# Patient Record
Sex: Female | Born: 1965
Health system: Southern US, Community
[De-identification: ages and names within clinical notes are randomized; demographics above are authoritative.]

## PROBLEM LIST (undated history)

## (undated) DIAGNOSIS — I219 Acute myocardial infarction, unspecified: Secondary | ICD-10-CM

## (undated) DIAGNOSIS — I2510 Atherosclerotic heart disease of native coronary artery without angina pectoris: Secondary | ICD-10-CM

## (undated) DIAGNOSIS — F419 Anxiety disorder, unspecified: Secondary | ICD-10-CM

## (undated) DIAGNOSIS — D496 Neoplasm of unspecified behavior of brain: Secondary | ICD-10-CM

## (undated) DIAGNOSIS — R519 Headache, unspecified: Secondary | ICD-10-CM

## (undated) DIAGNOSIS — T1491XA Suicide attempt, initial encounter: Secondary | ICD-10-CM

## (undated) DIAGNOSIS — D649 Anemia, unspecified: Secondary | ICD-10-CM

## (undated) DIAGNOSIS — Z923 Personal history of irradiation: Secondary | ICD-10-CM

## (undated) DIAGNOSIS — C50919 Malignant neoplasm of unspecified site of unspecified female breast: Secondary | ICD-10-CM

## (undated) DIAGNOSIS — B9689 Other specified bacterial agents as the cause of diseases classified elsewhere: Secondary | ICD-10-CM

## (undated) DIAGNOSIS — E119 Type 2 diabetes mellitus without complications: Secondary | ICD-10-CM

## (undated) DIAGNOSIS — F329 Major depressive disorder, single episode, unspecified: Secondary | ICD-10-CM

## (undated) DIAGNOSIS — F32A Depression, unspecified: Secondary | ICD-10-CM

## (undated) DIAGNOSIS — R6882 Decreased libido: Secondary | ICD-10-CM

## (undated) DIAGNOSIS — Z87448 Personal history of other diseases of urinary system: Secondary | ICD-10-CM

## (undated) DIAGNOSIS — N6002 Solitary cyst of left breast: Secondary | ICD-10-CM

## (undated) DIAGNOSIS — N760 Acute vaginitis: Secondary | ICD-10-CM

## (undated) DIAGNOSIS — N6001 Solitary cyst of right breast: Secondary | ICD-10-CM

## (undated) DIAGNOSIS — I10 Essential (primary) hypertension: Secondary | ICD-10-CM

## (undated) DIAGNOSIS — Z853 Personal history of malignant neoplasm of breast: Secondary | ICD-10-CM

## (undated) DIAGNOSIS — K279 Peptic ulcer, site unspecified, unspecified as acute or chronic, without hemorrhage or perforation: Secondary | ICD-10-CM

## (undated) DIAGNOSIS — W19XXXA Unspecified fall, initial encounter: Secondary | ICD-10-CM

## (undated) DIAGNOSIS — K219 Gastro-esophageal reflux disease without esophagitis: Secondary | ICD-10-CM

## (undated) DIAGNOSIS — B029 Zoster without complications: Secondary | ICD-10-CM

## (undated) DIAGNOSIS — E782 Mixed hyperlipidemia: Secondary | ICD-10-CM

## (undated) DIAGNOSIS — R569 Unspecified convulsions: Secondary | ICD-10-CM

## (undated) DIAGNOSIS — I639 Cerebral infarction, unspecified: Secondary | ICD-10-CM

## (undated) DIAGNOSIS — R296 Repeated falls: Secondary | ICD-10-CM

## (undated) DIAGNOSIS — R7303 Prediabetes: Secondary | ICD-10-CM

## (undated) DIAGNOSIS — Z9221 Personal history of antineoplastic chemotherapy: Secondary | ICD-10-CM

## (undated) DIAGNOSIS — C55 Malignant neoplasm of uterus, part unspecified: Secondary | ICD-10-CM

## (undated) DIAGNOSIS — J392 Other diseases of pharynx: Secondary | ICD-10-CM

## (undated) DIAGNOSIS — N649 Disorder of breast, unspecified: Secondary | ICD-10-CM

## (undated) HISTORY — DX: Personal history of other diseases of urinary system: Z87.448

## (undated) HISTORY — DX: Suicide attempt, initial encounter: T14.91XA

## (undated) HISTORY — DX: Gastro-esophageal reflux disease without esophagitis: K21.9

## (undated) HISTORY — DX: Other diseases of pharynx: J39.2

## (undated) HISTORY — PX: ABDOMINAL HYSTERECTOMY: SHX81

## (undated) HISTORY — PX: HAND SURGERY: SHX662

## (undated) HISTORY — DX: Neoplasm of unspecified behavior of brain: D49.6

## (undated) HISTORY — DX: Peptic ulcer, site unspecified, unspecified as acute or chronic, without hemorrhage or perforation: K27.9

## (undated) HISTORY — PX: OTHER SURGICAL HISTORY: SHX169

## (undated) HISTORY — PX: SHOULDER SURGERY: SHX246

## (undated) HISTORY — DX: Other specified bacterial agents as the cause of diseases classified elsewhere: B96.89

## (undated) HISTORY — DX: Malignant neoplasm of uterus, part unspecified: C55

## (undated) HISTORY — PX: EYE SURGERY: SHX253

## (undated) HISTORY — DX: Repeated falls: R29.6

## (undated) HISTORY — DX: Solitary cyst of right breast: N60.01

## (undated) HISTORY — DX: Disorder of breast, unspecified: N64.9

## (undated) HISTORY — DX: Headache, unspecified: R51.9

## (undated) HISTORY — DX: Mixed hyperlipidemia: E78.2

## (undated) HISTORY — DX: Acute vaginitis: N76.0

## (undated) HISTORY — DX: Anemia, unspecified: D64.9

## (undated) HISTORY — DX: Solitary cyst of left breast: N60.02

## (undated) HISTORY — DX: Essential (primary) hypertension: I10

## (undated) HISTORY — PX: ESOPHAGOGASTRODUODENOSCOPY: SHX1529

## (undated) HISTORY — PX: BREAST LUMPECTOMY: SHX2

## (undated) HISTORY — DX: Atherosclerotic heart disease of native coronary artery without angina pectoris: I25.10

## (undated) HISTORY — DX: Unspecified fall, initial encounter: W19.XXXA

## (undated) HISTORY — DX: Personal history of malignant neoplasm of breast: Z85.3

## (undated) HISTORY — PX: BLADDER SURGERY: SHX569

## (undated) HISTORY — DX: Zoster without complications: B02.9

## (undated) HISTORY — DX: Unspecified convulsions: R56.9

---
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
## 1997-12-02 ENCOUNTER — Other Ambulatory Visit: Admission: RE | Admit: 1997-12-02 | Discharge: 1997-12-02 | Payer: Self-pay

## 1998-12-11 ENCOUNTER — Ambulatory Visit (HOSPITAL_COMMUNITY): Admission: RE | Admit: 1998-12-11 | Discharge: 1998-12-11 | Payer: Self-pay | Admitting: Gastroenterology

## 1999-11-19 ENCOUNTER — Encounter: Payer: Self-pay | Admitting: Neurosurgery

## 1999-11-19 ENCOUNTER — Ambulatory Visit (HOSPITAL_COMMUNITY): Admission: RE | Admit: 1999-11-19 | Discharge: 1999-11-19 | Payer: Self-pay | Admitting: Neurosurgery

## 1999-12-23 ENCOUNTER — Ambulatory Visit (HOSPITAL_COMMUNITY): Admission: RE | Admit: 1999-12-23 | Discharge: 1999-12-23 | Payer: Self-pay | Admitting: Gastroenterology

## 1999-12-23 ENCOUNTER — Encounter: Payer: Self-pay | Admitting: Gastroenterology

## 2000-09-20 ENCOUNTER — Emergency Department (HOSPITAL_COMMUNITY): Admission: EM | Admit: 2000-09-20 | Discharge: 2000-09-21 | Payer: Self-pay | Admitting: Emergency Medicine

## 2000-09-21 ENCOUNTER — Ambulatory Visit (HOSPITAL_COMMUNITY): Admission: AD | Admit: 2000-09-21 | Discharge: 2000-09-21 | Payer: Self-pay | Admitting: *Deleted

## 2000-09-21 ENCOUNTER — Encounter (INDEPENDENT_AMBULATORY_CARE_PROVIDER_SITE_OTHER): Payer: Self-pay | Admitting: Specialist

## 2000-12-06 ENCOUNTER — Ambulatory Visit (HOSPITAL_COMMUNITY): Admission: RE | Admit: 2000-12-06 | Discharge: 2000-12-06 | Payer: Self-pay | Admitting: General Surgery

## 2000-12-10 ENCOUNTER — Inpatient Hospital Stay (HOSPITAL_COMMUNITY): Admission: EM | Admit: 2000-12-10 | Discharge: 2000-12-11 | Payer: Self-pay | Admitting: Emergency Medicine

## 2000-12-29 ENCOUNTER — Other Ambulatory Visit: Admission: RE | Admit: 2000-12-29 | Discharge: 2000-12-29 | Payer: Self-pay | Admitting: Internal Medicine

## 2001-01-03 ENCOUNTER — Ambulatory Visit (HOSPITAL_COMMUNITY): Admission: RE | Admit: 2001-01-03 | Discharge: 2001-01-03 | Payer: Self-pay | Admitting: Internal Medicine

## 2001-01-03 ENCOUNTER — Encounter: Payer: Self-pay | Admitting: Internal Medicine

## 2001-01-04 ENCOUNTER — Inpatient Hospital Stay (HOSPITAL_COMMUNITY): Admission: EM | Admit: 2001-01-04 | Discharge: 2001-01-08 | Payer: Self-pay | Admitting: Psychiatry

## 2001-01-19 ENCOUNTER — Emergency Department (HOSPITAL_COMMUNITY): Admission: EM | Admit: 2001-01-19 | Discharge: 2001-01-19 | Payer: Self-pay | Admitting: Internal Medicine

## 2001-02-23 ENCOUNTER — Emergency Department (HOSPITAL_COMMUNITY): Admission: EM | Admit: 2001-02-23 | Discharge: 2001-02-23 | Payer: Self-pay | Admitting: Internal Medicine

## 2001-04-25 ENCOUNTER — Inpatient Hospital Stay (HOSPITAL_COMMUNITY): Admission: RE | Admit: 2001-04-25 | Discharge: 2001-05-01 | Payer: Self-pay | Admitting: General Surgery

## 2001-04-27 ENCOUNTER — Encounter: Payer: Self-pay | Admitting: General Surgery

## 2001-04-28 ENCOUNTER — Encounter: Payer: Self-pay | Admitting: General Surgery

## 2001-05-03 ENCOUNTER — Emergency Department (HOSPITAL_COMMUNITY): Admission: EM | Admit: 2001-05-03 | Discharge: 2001-05-04 | Payer: Self-pay | Admitting: *Deleted

## 2001-05-15 ENCOUNTER — Emergency Department (HOSPITAL_COMMUNITY): Admission: EM | Admit: 2001-05-15 | Discharge: 2001-05-15 | Payer: Self-pay | Admitting: Emergency Medicine

## 2001-05-15 ENCOUNTER — Encounter: Payer: Self-pay | Admitting: Emergency Medicine

## 2001-06-06 ENCOUNTER — Ambulatory Visit (HOSPITAL_COMMUNITY): Admission: RE | Admit: 2001-06-06 | Discharge: 2001-06-06 | Payer: Self-pay | Admitting: Internal Medicine

## 2001-06-06 ENCOUNTER — Encounter: Payer: Self-pay | Admitting: Internal Medicine

## 2001-06-12 ENCOUNTER — Ambulatory Visit (HOSPITAL_COMMUNITY): Admission: RE | Admit: 2001-06-12 | Discharge: 2001-06-12 | Payer: Self-pay | Admitting: Internal Medicine

## 2001-06-12 ENCOUNTER — Encounter: Payer: Self-pay | Admitting: Internal Medicine

## 2001-06-16 ENCOUNTER — Encounter: Payer: Self-pay | Admitting: Internal Medicine

## 2001-06-16 ENCOUNTER — Ambulatory Visit (HOSPITAL_COMMUNITY): Admission: RE | Admit: 2001-06-16 | Discharge: 2001-06-16 | Payer: Self-pay | Admitting: Internal Medicine

## 2001-09-04 ENCOUNTER — Emergency Department (HOSPITAL_COMMUNITY): Admission: EM | Admit: 2001-09-04 | Discharge: 2001-09-04 | Payer: Self-pay | Admitting: Emergency Medicine

## 2001-10-29 ENCOUNTER — Emergency Department (HOSPITAL_COMMUNITY): Admission: EM | Admit: 2001-10-29 | Discharge: 2001-10-29 | Payer: Self-pay | Admitting: Emergency Medicine

## 2001-11-11 ENCOUNTER — Emergency Department (HOSPITAL_COMMUNITY): Admission: EM | Admit: 2001-11-11 | Discharge: 2001-11-11 | Payer: Self-pay | Admitting: Emergency Medicine

## 2001-11-23 ENCOUNTER — Ambulatory Visit (HOSPITAL_COMMUNITY): Admission: RE | Admit: 2001-11-23 | Discharge: 2001-11-23 | Payer: Self-pay | Admitting: Internal Medicine

## 2001-11-29 ENCOUNTER — Ambulatory Visit (HOSPITAL_COMMUNITY): Admission: RE | Admit: 2001-11-29 | Discharge: 2001-11-29 | Payer: Self-pay | Admitting: Internal Medicine

## 2002-01-22 ENCOUNTER — Encounter: Payer: Self-pay | Admitting: Internal Medicine

## 2002-01-22 ENCOUNTER — Ambulatory Visit (HOSPITAL_COMMUNITY): Admission: RE | Admit: 2002-01-22 | Discharge: 2002-01-22 | Payer: Self-pay | Admitting: Internal Medicine

## 2002-02-03 ENCOUNTER — Emergency Department (HOSPITAL_COMMUNITY): Admission: EM | Admit: 2002-02-03 | Discharge: 2002-02-03 | Payer: Self-pay | Admitting: Emergency Medicine

## 2002-03-29 ENCOUNTER — Emergency Department (HOSPITAL_COMMUNITY): Admission: EM | Admit: 2002-03-29 | Discharge: 2002-03-30 | Payer: Self-pay | Admitting: Emergency Medicine

## 2002-06-13 ENCOUNTER — Ambulatory Visit (HOSPITAL_COMMUNITY): Admission: RE | Admit: 2002-06-13 | Discharge: 2002-06-13 | Payer: Self-pay | Admitting: General Surgery

## 2002-07-24 ENCOUNTER — Emergency Department (HOSPITAL_COMMUNITY): Admission: EM | Admit: 2002-07-24 | Discharge: 2002-07-24 | Payer: Self-pay | Admitting: Emergency Medicine

## 2002-12-10 ENCOUNTER — Emergency Department (HOSPITAL_COMMUNITY): Admission: EM | Admit: 2002-12-10 | Discharge: 2002-12-10 | Payer: Self-pay | Admitting: Emergency Medicine

## 2003-02-02 ENCOUNTER — Emergency Department (HOSPITAL_COMMUNITY): Admission: EM | Admit: 2003-02-02 | Discharge: 2003-02-03 | Payer: Self-pay | Admitting: Internal Medicine

## 2003-02-02 ENCOUNTER — Encounter: Payer: Self-pay | Admitting: Internal Medicine

## 2003-03-01 ENCOUNTER — Emergency Department (HOSPITAL_COMMUNITY): Admission: EM | Admit: 2003-03-01 | Discharge: 2003-03-02 | Payer: Self-pay | Admitting: Emergency Medicine

## 2003-05-29 ENCOUNTER — Ambulatory Visit (HOSPITAL_COMMUNITY): Admission: RE | Admit: 2003-05-29 | Discharge: 2003-05-29 | Payer: Self-pay | Admitting: General Surgery

## 2003-07-03 ENCOUNTER — Ambulatory Visit (HOSPITAL_COMMUNITY): Admission: RE | Admit: 2003-07-03 | Discharge: 2003-07-03 | Payer: Self-pay | Admitting: General Surgery

## 2003-08-08 ENCOUNTER — Emergency Department (HOSPITAL_COMMUNITY): Admission: EM | Admit: 2003-08-08 | Discharge: 2003-08-09 | Payer: Self-pay | Admitting: *Deleted

## 2003-08-27 ENCOUNTER — Emergency Department (HOSPITAL_COMMUNITY): Admission: EM | Admit: 2003-08-27 | Discharge: 2003-08-27 | Payer: Self-pay | Admitting: Emergency Medicine

## 2003-12-09 ENCOUNTER — Ambulatory Visit (HOSPITAL_COMMUNITY): Admission: RE | Admit: 2003-12-09 | Discharge: 2003-12-09 | Payer: Self-pay | Admitting: General Surgery

## 2004-01-25 ENCOUNTER — Emergency Department (HOSPITAL_COMMUNITY): Admission: EM | Admit: 2004-01-25 | Discharge: 2004-01-26 | Payer: Self-pay | Admitting: Emergency Medicine

## 2004-04-22 ENCOUNTER — Emergency Department (HOSPITAL_COMMUNITY): Admission: EM | Admit: 2004-04-22 | Discharge: 2004-04-22 | Payer: Self-pay | Admitting: Emergency Medicine

## 2004-04-23 ENCOUNTER — Ambulatory Visit (HOSPITAL_COMMUNITY): Admission: RE | Admit: 2004-04-23 | Discharge: 2004-04-23 | Payer: Self-pay | Admitting: Specialist

## 2004-06-05 ENCOUNTER — Emergency Department (HOSPITAL_COMMUNITY): Admission: EM | Admit: 2004-06-05 | Discharge: 2004-06-05 | Payer: Self-pay | Admitting: Emergency Medicine

## 2004-08-26 ENCOUNTER — Encounter: Admission: RE | Admit: 2004-08-26 | Discharge: 2004-08-26 | Payer: Self-pay | Admitting: General Surgery

## 2005-01-06 ENCOUNTER — Emergency Department (HOSPITAL_COMMUNITY): Admission: EM | Admit: 2005-01-06 | Discharge: 2005-01-06 | Payer: Self-pay | Admitting: Emergency Medicine

## 2005-02-18 ENCOUNTER — Emergency Department (HOSPITAL_COMMUNITY): Admission: EM | Admit: 2005-02-18 | Discharge: 2005-02-18 | Payer: Self-pay | Admitting: Emergency Medicine

## 2005-02-19 ENCOUNTER — Emergency Department (HOSPITAL_COMMUNITY): Admission: EM | Admit: 2005-02-19 | Discharge: 2005-02-20 | Payer: Self-pay | Admitting: Emergency Medicine

## 2005-03-02 ENCOUNTER — Encounter (HOSPITAL_COMMUNITY): Admission: RE | Admit: 2005-03-02 | Discharge: 2005-04-01 | Payer: Self-pay | Admitting: General Surgery

## 2005-03-02 ENCOUNTER — Ambulatory Visit: Payer: Self-pay | Admitting: *Deleted

## 2005-03-08 ENCOUNTER — Encounter (HOSPITAL_COMMUNITY): Admission: RE | Admit: 2005-03-08 | Discharge: 2005-04-07 | Payer: Self-pay | Admitting: Internal Medicine

## 2005-05-03 ENCOUNTER — Ambulatory Visit: Payer: Self-pay | Admitting: Orthopedic Surgery

## 2005-05-05 ENCOUNTER — Ambulatory Visit (HOSPITAL_COMMUNITY): Admission: RE | Admit: 2005-05-05 | Discharge: 2005-05-05 | Payer: Self-pay | Admitting: Orthopedic Surgery

## 2005-08-12 ENCOUNTER — Ambulatory Visit (HOSPITAL_COMMUNITY): Admission: RE | Admit: 2005-08-12 | Discharge: 2005-08-12 | Payer: Self-pay | Admitting: Neurology

## 2006-04-27 ENCOUNTER — Ambulatory Visit (HOSPITAL_COMMUNITY): Admission: RE | Admit: 2006-04-27 | Discharge: 2006-04-27 | Payer: Self-pay | Admitting: Internal Medicine

## 2006-05-05 ENCOUNTER — Emergency Department (HOSPITAL_COMMUNITY): Admission: EM | Admit: 2006-05-05 | Discharge: 2006-05-05 | Payer: Self-pay | Admitting: Emergency Medicine

## 2006-05-18 ENCOUNTER — Ambulatory Visit (HOSPITAL_COMMUNITY): Admission: RE | Admit: 2006-05-18 | Discharge: 2006-05-18 | Payer: Self-pay | Admitting: Internal Medicine

## 2006-06-12 ENCOUNTER — Emergency Department (HOSPITAL_COMMUNITY): Admission: EM | Admit: 2006-06-12 | Discharge: 2006-06-13 | Payer: Self-pay | Admitting: Emergency Medicine

## 2006-06-15 ENCOUNTER — Ambulatory Visit: Payer: Self-pay | Admitting: Orthopedic Surgery

## 2006-10-18 ENCOUNTER — Emergency Department (HOSPITAL_COMMUNITY): Admission: EM | Admit: 2006-10-18 | Discharge: 2006-10-18 | Payer: Self-pay | Admitting: Emergency Medicine

## 2006-11-21 ENCOUNTER — Emergency Department (HOSPITAL_COMMUNITY): Admission: EM | Admit: 2006-11-21 | Discharge: 2006-11-22 | Payer: Self-pay | Admitting: Emergency Medicine

## 2006-11-29 ENCOUNTER — Emergency Department (HOSPITAL_COMMUNITY): Admission: EM | Admit: 2006-11-29 | Discharge: 2006-11-29 | Payer: Self-pay | Admitting: Emergency Medicine

## 2006-12-01 ENCOUNTER — Emergency Department (HOSPITAL_COMMUNITY): Admission: EM | Admit: 2006-12-01 | Discharge: 2006-12-02 | Payer: Self-pay | Admitting: Emergency Medicine

## 2007-03-16 ENCOUNTER — Ambulatory Visit: Payer: Self-pay | Admitting: Cardiology

## 2007-09-14 ENCOUNTER — Ambulatory Visit (HOSPITAL_COMMUNITY): Admission: RE | Admit: 2007-09-14 | Discharge: 2007-09-14 | Payer: Self-pay | Admitting: Orthopaedic Surgery

## 2007-09-20 ENCOUNTER — Encounter (HOSPITAL_COMMUNITY): Admission: RE | Admit: 2007-09-20 | Discharge: 2007-10-20 | Payer: Self-pay | Admitting: Orthopaedic Surgery

## 2007-10-23 ENCOUNTER — Encounter (HOSPITAL_COMMUNITY): Admission: RE | Admit: 2007-10-23 | Discharge: 2007-11-22 | Payer: Self-pay | Admitting: Orthopaedic Surgery

## 2007-11-27 ENCOUNTER — Emergency Department (HOSPITAL_COMMUNITY): Admission: EM | Admit: 2007-11-27 | Discharge: 2007-11-27 | Payer: Self-pay | Admitting: Emergency Medicine

## 2008-08-10 HISTORY — PX: COLONOSCOPY: SHX174

## 2010-03-03 ENCOUNTER — Ambulatory Visit (HOSPITAL_COMMUNITY): Admission: RE | Admit: 2010-03-03 | Discharge: 2010-03-03 | Payer: Self-pay | Admitting: Orthopedic Surgery

## 2010-05-02 ENCOUNTER — Encounter: Payer: Self-pay | Admitting: Orthopedic Surgery

## 2010-05-02 ENCOUNTER — Encounter: Payer: Self-pay | Admitting: General Surgery

## 2010-05-03 ENCOUNTER — Encounter: Payer: Self-pay | Admitting: General Surgery

## 2010-08-28 NOTE — H&P (Signed)
[facility]  Patient:    Bethany Wang, Bethany Wang Visit Number: [id number] MRN: [id number]          Service Type: OUT Location: RAD Attending Physician:  Elliot Cousin Dictated by:   Candi Leash. Orsini, N.P. Admit Date:  01/03/2001 Discharge Date: 01/03/2001                     Psychiatric Admission Assessment  PATIENT IDENTIFICATION:  This is a 45 year old single African-American female voluntarily admitted for depression and suicidal ideation.  HISTORY OF PRESENT ILLNESS:  The patient presents with a history of depression, experiencing stress after recent breast surgery, overwhelmed with her children, two who have psychiatric diagnoses, and a mother who has been borrowing money from her and not helping her when she needs some financial assistance.  The patient reports she has been crying, feeling "down," reports suicidal ideation with no specific plan.  She is denying any current suicidal ideation.  She does state she has had decreased sleep and decreased appetite with about a 20 pound weight loss.  She denies any psychotic symptoms, reports she has been compliant with her medications.  PAST PSYCHIATRIC HISTORY:  First admission to [facility], no outpatient treatment.  She had been at [facility] in 2000 for an overdose.  SUBSTANCE ABUSE HISTORY:  She smokes a half a pack a day since age 51.  She is a nondrinker, denies any substance abuse.  PAST MEDICAL HISTORY:  Primary care [place] Lipuma is Dr. Sherrie Mustache in [place]. Medical problems: Breast surgery approximately two weeks ago for apparent lumpectomy or abscess.  The patient reports diagnosis of hypertension but has been on no medication yet, was awaiting until cleared from her breast surgery.  MEDICATIONS: 1. Prozac 20 mg q.d. 2. Keflex 500 mg t.i.d. for 10 days. 3. Skelaxin 800 mg. 4. Zyprexa 10 mg q.h.s. 5. Reglan 10 mg q.i.d. 6. Protonix 40 mg q.a.m. 7. Lorcet Plus and Lorcet  tablets for migraines.  DRUG ALLERGIES:  PENICILLIN.  PHYSICAL EXAMINATION:  VITAL SIGNS:  The patient is 171 pounds.  She is 5 feet 4 inches tall. Temperature 98.3, pulse 67, respirations 18, blood pressure 97/63.  GENERAL:  The patient is a 45 year old single African-American female in no acute distress, appears as if she has been doing some crying.  Her eyes are puffy.  HEENT:  Head is normocephalic.  Hair is equally distributed.  EOMs are intact bilaterally.  External ear canals are patent.  Hearing is appropriate to conversation.  No sinus tenderness, no nasal discharge.  Fair dentition, a few teeth missing.  Tongue protrudes to midline without tremor.  She can clench her teeth and puff out her cheeks.  NECK:  Supple, no JVD, negative lymphadenopathy.  Thyroid is nonpalpable, nontender.  Trachea is midline.  CHEST:  Clear to auscultation, no adventitious sounds, no cough.  CARDIOVASCULAR:  Heart rate is regular rate and rhythm without murmurs, gallops, or rubs.  No edema was noted.  BREAST:  Left breast observed with healed incision at the 5 oclock position with no erythema, no drainage.  ABDOMEN:  Soft, nontender abdomen, no CVA tenderness.  MUSCULOSKELETAL:  No joint swelling or deformity, good range of motion. Muscle strength and tone is equal bilaterally.  There is no sign of injury.  SKIN:  Warm and dry.  Nail beds are pink with good capillary refill, no cyanosis, no rashes noted.  NEUROLOGIC: Oriented x 3.  Cranial nerves are grossly intact.  Good grip strength  bilaterally, no involuntary movements.  Gait is normal.  Cerebellar function is intact with heel-to-shin and normal alternating movements. Romberg is without findings.  SOCIAL HISTORY:  She is a 45 year old single African-American female with three children aged 31, 29, and 24.  She lives with her children.  She has a disability hearing on January 06, 2001.  She completed the 10th grade.  She has no  other legal problems.  FAMILY HISTORY:  Son with bipolar, daughter with schizophrenia.  MENTAL STATUS EXAMINATION:  Alert, middle-aged African-American female, cooperative, casually dressed.  Eyes appear puffy.  Speech is normal and relevant.  Mood is depressed.  Affect is flat and depressed.  Thought processes are coherent.  There is no evidence of psychosis, no auditory or visual hallucinations or suicidal or homicidal ideations, no paranoia. Cognitive: Intact.  Memory is good.  Judgment is fair.  Insight is fair.  ADMISSION DIAGNOSES: Axis I:    Major depression, recurrent. Axis II:   Deferred. Axis III:  1. Status post breast lumpectomy.            2. Hypertension by history. Axis IV:   Problems with primary support group and lack of support and other            psychosocial problems. Axis V:    Current is 35, past year is 73.  INITIAL PLAN OF CARE:  Plan is a voluntary admission to [facility] for depression and suicidal ideation.  Contract for safety.  Check every 15 minutes.  Will resume her routine medications.  Will increase her antidepressant to reduce depressive symptoms.  Will obtain labs.  Our goal is return the patient to prior living arrangement, have case worker see if there might be any assistive programs for children to give the patient some outlet.  ESTIMATED LENGTH OF STAY:  Three to four days. Dictated by:   Candi Leash. Orsini, N.P. Attending Physician:  Elliot Cousin DD:  01/05/01 TD:  01/06/01 Job: [id number]

## 2010-08-28 NOTE — Procedures (Signed)
NAMEPATINA, SPANIER                ACCOUNT NO.:  [id number]   MEDICAL RECORD NO.:  [id number]          PATIENT TYPE:  OUT   LOCATION:  RAD                           FACILITY:  [facility]   PHYSICIAN:  Vida Roller, M.D.   DATE OF BIRTH:  [birth year]   DATE OF PROCEDURE:  03/02/2005  DATE OF DISCHARGE:                                  ECHOCARDIOGRAM   HISTORY OF PRESENT ILLNESS:  Ms. Rolley Sims is a 45 year old woman with no  previous cardiac history with chest discomfort.  Technical quality of the  study is adequate.   M-MODE TRACINGS:  Aorta is 31 mm.   Left atrium is 31 mm.   Septum is 10 mm.   Posterior wall is 10 mm.   Left ventricular diastolic dimension 42 mm.   Left ventricular systolic dimension 28 mm.   Two-dimensional echocardiogram and Doppler imaging:  The left ventricle is  normal size with normal systolic function estimated ejection fraction 50-  55%.  No wall motion abnormalities seen.   Right ventricle is normal size with normal systolic function.   Both atria are normal size.   The aortic valve is without significant stenosis or regurgitation.   The mitral valve is without significant stenosis or regurgitation.   Tricuspid valve is without regurgitation.   There is no pericardial effusion.  Review of the study from August 2003,  shows no significant change.     Vida Roller, M.D.  Electronically Signed    JH/MEDQ  D:  03/02/2005  T:  03/02/2005  Job:  [id number]

## 2010-08-28 NOTE — Discharge Summary (Signed)
[facility]  Patient:    Bethany Wang, Bethany Wang Visit Number: [id number] MRN: [id number]          Service Type: EMS Location: ED Attending Physician:  Annamarie Dawley Dictated by:   Elpidio Anis, M.D. Admit Date:  05/15/2001 Discharge Date: 05/15/2001                             Discharge Summary  DISCHARGE DIAGNOSES: 1. Severe dysmenorrhea. 2. Uterine fibroids. 3. Adenomyosis of the uterus. 4. Bipolar disorder. 5. Gastroesophageal reflux disease. 6. Migraine headaches. 7. Peptic ulcer disease.  PROCEDURE:  Total abdominal hysterectomy on January 14.  DISPOSITION:  Discharged home.  CONDITION ON DISCHARGE:  Stable/satisfactory condition.  FOLLOWUP:  Follow up in the office one week postdischarge.  DISCHARGE MEDICATIONS: 1. Tamiflu 75 mg b.i.d. 2. Tylox two every four hours as needed for pain. 3. Levaquin 500 mg q.d. 4. Zyprexa 10 mg q.h.s. 5. Prevacid 30 mg q.d. 6. Prozac 40 mg q.d. 7. Reglan 10 mg q.a.c. and q.h.s.  HISTORY OF PRESENT ILLNESS:  This is a 45 year old female with history of chronic dysmenorrhea and severe pelvic pain for greater than five years requiring narcotic analgesics with documented uterine fibroids.  The patient had been counseled for over a year for hysterectomy.  Because of increasing pain, she finally made up her mind to proceed with hysterectomy.  PAST MEDICAL HISTORY: 1. Gastroesophageal reflux disease. 2. Migraine headaches. 3. Bipolar disorder. 4. History of three suicidal attempts and has had inpatient treatment for    severe depression.  MEDICATIONS: 1. Lorcet plus q.4h. p.r.n. 2. Vicodin p.r.n. 3. Darvocet-N 100 q.4h. p.r.n. **These have been given on her multiple physician visits, as well as emergency room visits.  PHYSICAL EXAMINATION:  Unremarkable except for her lower abdomen and pelvis. She had diffuse lower abdominal tenderness with diffuse pelvic fullness and tenderness with the suggestion of  a pelvic mass.  HOSPITAL COURSE:  She was taken to the operating room on January 14.  She had small uterine fibroids with nodular thickening of the uterine wall.  Total abdominal hysterectomy was done uneventfully.  Postoperative course was complicated by episodes of fever and chills, elevated temperature to 102 to 103.  She was treated with Zosyn antibiotics and later started on Tamiflu because of suspected viral syndrome.  She had some cough and nasal congestion.  Chest x-ray showed basilar atelectasis.  She had a lot of muscle pain especially soft tissue pain of her lower back.  There was no evidence of intra-abdominal infection.  By postop day #4, she was feeling much better.  Fever and chills had resolved.  She was tolerating a diet and her temperature was down.  It was decided that she could be monitored closely. She was still febrile on January 20, but felt very well.  It was felt that her febrile illness was due to a viral syndrome.  She was discharged in an otherwise satisfactory condition.                          Dictated by:   Elpidio Anis, M.D. Attending Physician:  Annamarie Dawley DD:  05/29/01 TD:  05/29/01 Job: [id number]

## 2010-08-28 NOTE — H&P (Signed)
Bethany Wang, Bethany Wang                ACCOUNT NO.:  [id number]   MEDICAL RECORD NO.:  [id number]          PATIENT TYPE:  AMB   LOCATION:  DAY                           FACILITY:  [facility]   PHYSICIAN:  Jerolyn Shin C. Katrinka Blazing, M.D.   DATE OF BIRTH:  April 06, 1966   DATE OF ADMISSION:  DATE OF DISCHARGE:  LH                                HISTORY & PHYSICAL   HISTORY OF PRESENT ILLNESS:  A 45 year old female with a history of  hematemesis.  The patient gives a history of having intermittent  hematemesis.  She relates it to food.  She also has mild pain in her right  lower quadrant.  She denies epigastric pain.  She has a history of peptic  ulcer disease and gastroesophageal reflux disease.  She is scheduled for  upper endoscopy.   PAST HISTORY:  1.  She has chronic anemia.  2.  Gastroesophageal reflux disease.   SURGICAL HISTORY:  1.  Questionable abdominal surgery in the past.  No clinically present on      previous upper endoscopy.  2.  Status post hysterectomy.  3.  Status post tubal ligation.  4.  Left breast biopsy.   MEDICATIONS:  Multiple medications.  The patient did not bring her  medications to the office.   ALLERGIES:  PENICILLIN.   PHYSICAL EXAMINATION:  VITAL SIGNS:  Blood pressure of 110/86, pulse 76,  respirations 18, weight 172 pounds.  HEENT:  Unremarkable.  NECK:  Supple.  CHEST:  Clear.  HEART:  Regular rate and rhythm without murmur, gallop, or rub.  ABDOMEN:  Soft, nontender, without masses.  EXTREMITIES:  No clubbing, cyanosis, or edema.  NEUROLOGIC:  No focal motor, sensory, or cerebellar deficit.   IMPRESSION:  1.  History of hematemesis with prior history of peptic ulcer disease.  2.  Gastroesophageal reflux disease.  3.  Hypertension.  4.  Fibrocystic disease.  5.  Bipolar disorder.  6.  Osteoarthritis.   PLAN:  Upper endoscopy.     Lero   LCS/MEDQ  D:  04/22/2004  T:  04/23/2004  Job:  [id number]

## 2010-08-28 NOTE — Procedures (Signed)
   [name], Bethany Wang                            ACCOUNT NO.:  [id number]   MEDICAL RECORD NO.:  [id number]                    PATIENT TYPE:   LOCATION:                                       FACILITY:   PHYSICIAN:  Fredirick Maudlin, M.D.              DATE OF BIRTH:   DATE OF PROCEDURE:  DATE OF DISCHARGE:                              PULMONARY FUNCTION TEST   PULMONARY FUNCTION TEST:  1. Spirometry is normal.  2. Lung volumes show mild restrictive change.  3. DLCO moderately reduced.  4. Arterial blood gases are normal.                                               Fredirick Maudlin, M.D.    ELH/MEDQ  D:  11/29/2001  T:  11/30/2001  Job:  [id number]

## 2010-08-28 NOTE — Op Note (Signed)
[facility]  Patient:    Bethany Wang, Bethany Wang Visit Number: [id number] MRN: [id number]          Service Type: SUR Location: 3A A332 01 Attending Physician:  Dessa Phi Dictated by:   Elpidio Anis, M.D. Admit Date:  04/25/2001                             Operative Report  PREOPERATIVE DIAGNOSIS:  Severe dysmenorrhea, uterine fibroids.  POSTOPERATIVE DIAGNOSIS:  Severe dysmenorrhea, uterine fibroids with small uterine fibroids.  PROCEDURE:  Total abdominal hysterectomy.  SURGEON:  Elpidio Anis, M.D.  DESCRIPTION OF PROCEDURE:  Under general anesthesia, the patients abdomen was prepped and draped in a sterile field.  The standard Pfannenstiel incision was made.  Exploration revealed nodular, mildly enlarged uterus with some subserosal and intramural small fibroids.  She did not have any major fibroids noted.  There was scarring of both ovaries with ruptured cysts on the left side.  Because of the patients history of severe pain, it was felt that proceeding with hysterectomy was in order.  Round ligaments were isolated, doubly clamped, divided and controlled with ligatures of 0 Dexon. Anterior bladder flap was developed.  The tubo-ovarian complex was doubly clamped at its junction with the uterus divided and controlled with ligatures of 0 Dexon. This was done bilaterally.  Uterine vessels were doubly clamped, divided and controlled with ligatures of 0 Dexon.  This was continued down to the apex of the vagina.  Using curved Heaney clamp, the apex of the vagina was serially clamped, divided and controlled with ligatures of 0 Dexon.  The vaginal cuff was reinforced using running 0 Biosyn.  Hemostasis was achieved.  There was minimal bleeding.  Irrigation was carried out.  The pelvis was reperitonealized using 2-0 Biosyn and 3-0 Biosyn.  Further irrigation was carried out.  The fluid was totally clear.  Sponge, needle, instrument, and blade counts were  verified as correct.  The abdomen was closed using 2-0 Biosyn on the peritoneum, 0 Biosyn on the muscle, 0 Prolene on the fascia, 2-0 Biosyn on the subcutaneous tissue, and staples on the skin.  Dressing was placed.  She was awakened from anesthesia, transferred to a bed and taken to the post anesthesia care unit. Dictated by:   Elpidio Anis, M.D. Attending Physician:  Dessa Phi DD:  04/25/01 TD:  04/26/01 Job: [id number]

## 2010-09-29 ENCOUNTER — Other Ambulatory Visit: Payer: Self-pay | Admitting: Internal Medicine

## 2010-09-29 DIAGNOSIS — N644 Mastodynia: Secondary | ICD-10-CM

## 2010-10-02 ENCOUNTER — Ambulatory Visit
Admission: RE | Admit: 2010-10-02 | Discharge: 2010-10-02 | Disposition: A | Discharge status: Home / Self Care | Payer: Medicare Other | Source: Ambulatory Visit | Attending: Internal Medicine | Admitting: Internal Medicine

## 2010-10-02 ENCOUNTER — Other Ambulatory Visit: Payer: Self-pay | Admitting: Internal Medicine

## 2010-10-02 DIAGNOSIS — N644 Mastodynia: Secondary | ICD-10-CM

## 2011-01-26 ENCOUNTER — Ambulatory Visit
Admission: RE | Admit: 2011-01-26 | Discharge: 2011-01-26 | Disposition: A | Discharge status: Home / Self Care | Payer: Medicare Other | Source: Ambulatory Visit | Attending: Internal Medicine | Admitting: Internal Medicine

## 2011-01-26 ENCOUNTER — Other Ambulatory Visit: Payer: Self-pay | Admitting: Internal Medicine

## 2011-01-26 DIAGNOSIS — N6310 Unspecified lump in the right breast, unspecified quadrant: Secondary | ICD-10-CM

## 2011-01-26 DIAGNOSIS — N63 Unspecified lump in unspecified breast: Secondary | ICD-10-CM

## 2011-02-16 ENCOUNTER — Ambulatory Visit
Admission: RE | Admit: 2011-02-16 | Discharge: 2011-02-16 | Disposition: A | Discharge status: Home / Self Care | Payer: PRIVATE HEALTH INSURANCE | Source: Ambulatory Visit | Attending: Internal Medicine | Admitting: Internal Medicine

## 2011-02-16 ENCOUNTER — Other Ambulatory Visit: Payer: Self-pay | Admitting: Internal Medicine

## 2011-02-16 DIAGNOSIS — N63 Unspecified lump in unspecified breast: Secondary | ICD-10-CM

## 2011-02-16 DIAGNOSIS — N6310 Unspecified lump in the right breast, unspecified quadrant: Secondary | ICD-10-CM

## 2011-06-01 ENCOUNTER — Encounter: Payer: Self-pay | Admitting: Gastroenterology

## 2011-06-01 ENCOUNTER — Ambulatory Visit (INDEPENDENT_AMBULATORY_CARE_PROVIDER_SITE_OTHER): Payer: Medicaid Other | Admitting: Gastroenterology

## 2011-06-01 DIAGNOSIS — R1310 Dysphagia, unspecified: Secondary | ICD-10-CM

## 2011-06-01 NOTE — Progress Notes (Signed)
Referring Provider: Shah, Ashish, MD Primary Care Physician:  SHAH,ASHISH, MD, MD Primary Gastroenterologist:  Dr. Rourk  Chief Complaint  Patient presents with  . Dysphagia    HPI:   Bethany Wang is an animated 46-year-old female who presents at the request of Dr. Shah secondary to dysphagia for several months. When I first met her, she reported an EGD by Dr. Rourk a few years ago with dilation. I have looked in epic, centricity, and echart for this documentation. I do not have this currently. Apparently, she also had an EGD in 2001 with Dr. Hayes, possibly an EGD in 2006 with Dr. Smith. I do not have the reports from 2006.  Regardless, she states she felt significantly better after her last EGD with Dr. Rourk (again, I am unable to find this). Reports dysphagia now X 3 mos. Unable to swallow pills. Difficulty with breads, chicken, meats, fresh veggies. Has to regurgitate. No odynophagia. Reflux controlled with Prilosec. Amitiza for constipation. Reports seeing Dr. Benson a few months ago and proceeding with what sounds like a barium swallow. She is quite adamant that she will not return there.    Interestingly, she told me today that she had a history of breast cancer, uterine cancer, and a brain tumor. I recorded this in her history; however, upon reviewing her records, it appears this is not the case. She has a hx of a benign breast mass requiring lumpectomy, fibroids requiring hysterectomy. I have reviewed past scans and am unable to find any mention of a brain tumor. 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 She states that due to this brain tumor, she has seizures.   Upon further review of her chart, it appears she had presented to the ED multiple times in the past for various ailments. She also has a hx of suicide ideations in the remote past. I question a psychiatric component, as she has proclaimed she has had cancer and "been in remission  for 10 years" when this is clearly not the case.   Past Medical History  Diagnosis Date  . 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 Seizures   . Hypertension   . Hypercholesterolemia   . GERD (gastroesophageal reflux disease)   . Suicide attempt     3 attempts in remote past  . PUD (peptic ulcer disease)     Possible?    Past Surgical History  Procedure Date  . Abdominal hysterectomy     fibroids  . Bladder surgery   . Shoulder surgery     left  . Hand surgery     right  . Eye surgery     removed glass  . Left breast lumpectomy     benign  . Colonoscopy May 2010    [facility]le[facility]man: normal rectum, internal hemorrhoids, , benign colonic polyp  . Esophagogastroduodenoscopy     2001 Dr. Hayes: distal esophagitis, small hiatal hernia,. Dr. Smith 2006? no records available currently, pt also reports  EGD a few years ago with Dr. Rourk, do not have these reports anywhere in medical records    Current Outpatient Prescriptions  Medication Sig Dispense Refill  . amitriptyline (ELAVIL) 25 MG tablet Take 25 mg by mouth at bedtime.      . amLODipine (NORVASC) 2.5 MG tablet Take 2.5 mg by mouth daily.      . atorvastatin (LIPITOR) 10 MG tablet Take 10 mg by mouth daily.      .   budesonide-formoterol (SYMBICORT) 160-4.5 MCG/ACT inhaler Inhale 2 puffs into the lungs 2 (two) times daily.      . carisoprodol (SOMA) 350 MG tablet Take 350 mg by mouth 4 (four) times daily as needed.      . cetirizine (ZYRTEC) 10 MG tablet Take 10 mg by mouth daily.      . DULoxetine (CYMBALTA) 60 MG capsule Take 60 mg by mouth daily.      . fexofenadine (ALLEGRA) 180 MG tablet Take 180 mg by mouth daily.      . fluticasone (FLONASE) 50 MCG/ACT nasal spray Place 2 sprays into the nose daily.      . hydrocortisone (ANUSOL-HC) 25 MG suppository Place 25 mg rectally 2 (two) times daily.      . ibuprofen (ADVIL,MOTRIN) 200 MG tablet Take 200 mg by mouth every 6 (six) hours as needed.      .  lisinopril-hydrochlorothiazide (PRINZIDE,ZESTORETIC) 20-12.5 MG per tablet Take 1 tablet by mouth daily.      . lubiprostone (AMITIZA) 24 MCG capsule Take 24 mcg by mouth 2 (two) times daily with a meal.      . omeprazole (PRILOSEC) 20 MG capsule Take 20 mg by mouth daily.        Allergies as of 06/01/2011 - Review Complete 06/01/2011  Allergen Reaction Noted  . Penicillins Anaphylaxis 01/26/2011    Family History  Problem Relation Age of Onset  . Colon cancer Paternal Grandfather     History   Social History  . Marital Status: Single    Spouse Name: N/A    Number of Children: N/A  . Years of Education: N/A   Occupational History  . Not on file.   Social History Main Topics  . Smoking status: Current Everyday Smoker -- 0.2 packs/day    Types: Cigarettes  . Smokeless tobacco: Not on file  . Alcohol Use: No  . Drug Use: No  . Sexually Active: Not on file   Other Topics Concern  . Not on file   Social History Narrative  . No narrative on file    Review of Systems: Gen: Denies any fever, chills, loss of appetite, fatigue, weight loss. CV: Denies chest pain, heart palpitations, syncope, peripheral edema. Resp: Denies shortness of breath with rest, cough, wheezing GI: SEE HPI GU : Denies urinary burning, urinary frequency, urinary incontinence.  MS: Denies joint pain, muscle weakness, cramps, limited movement Derm: Denies rash, itching, dry skin Psych: Denies depression, anxiety, confusion or memory loss  Heme: Denies bruising, bleeding, and enlarged lymph nodes.  Physical Exam: BP 143/92  Pulse 73  Temp(Src) 98.2 F (36.8 C) (Temporal)  Ht 5' 5" (1.651 m)  Wt 183 lb 6.4 oz (83.19 kg)  BMI 30.52 kg/m2 General:   Alert and oriented. Well-developed, well-nourished, pleasant and cooperative. Head:  Normocephalic and atraumatic. Eyes:  Conjunctiva pink, sclera clear, no icterus.   Conjunctiva pink. Ears:  Normal auditory acuity. Nose:  No deformity, discharge,   or lesions. Mouth:  No deformity or lesions, mucosa pink and moist.  Neck:  Supple, without mass or thyromegaly. Lungs:  Clear to auscultation bilaterally, without wheezing, rales, or rhonchi.  Heart:  S1, S2 present without murmurs noted.  Abdomen:  +BS, soft, non-tender and non-distended. Without mass or HSM. No rebound or guarding. No hernias noted. Rectal:  Deferred  Msk:  Symmetrical without gross deformities. Normal posture. Extremities:  Without clubbing or edema. Neurologic:  Alert and  oriented x4;  grossly normal neurologically. Skin:  Intact,   warm and dry without significant lesions or rashes Cervical Nodes:  No significant cervical adenopathy. Psych:  Alert and cooperative. Normal mood and affect. Becomes quite dramatic and animated when discussing past hospital experiences, medical hx.    

## 2011-06-01 NOTE — Patient Instructions (Signed)
We have set you up for an upper endoscopy with Dr. Jena Gauss in the near future.  Continue to chew well, take small bites, avoid trigger foods.   Further recommendations to follow.

## 2011-06-01 NOTE — Assessment & Plan Note (Signed)
46 year old female with several month hx of pill and solid food dysphagia, query secondary to esophageal web, ring, or stricture. Interestingly, she reports EGD with dilation by Dr. Jena Gauss several years ago. I have been unable to find documentation of this. She does have hx of EGDs in remote past; there is documentation of 2001 and possibly 2006, but I do not have records from 2006. At time of visit, she reports hx of seizures and multiple medical issues that require daily medications; she was quite concerned that she could not tolerate these pills. I asked that she be scheduled soon for evaluation.   However, I am somewhat concerned about a psychological component at this time. She very well may have a mechanical issue, yet she informed me she had a hx of breast ca, uterine ca, and a brain tumor. None of this is true, and I am unsure if she actually did have a procedure with Dr. Jena Gauss in the past. Please see HPI for full explanation.  We will still proceed with an EGD/ED with Dr. Jena Gauss to evaluate etiology of dysphagia. As of note, pt had seen Dr. Teena Dunk a few months ago but does not want to be seen again. She states she wants the physicians she sees to "be straight with me". She was agreeable to an upper endoscopy and appreciative when she left.   Proceed with upper endoscopy in the near future with Dr. Jena Gauss. The risks, benefits, and alternatives have been discussed in detail with patient. They have stated understanding and desire to proceed.

## 2011-06-02 ENCOUNTER — Other Ambulatory Visit: Payer: Self-pay | Admitting: Internal Medicine

## 2011-06-02 ENCOUNTER — Encounter (HOSPITAL_COMMUNITY): Payer: Self-pay | Admitting: *Deleted

## 2011-06-02 ENCOUNTER — Encounter (HOSPITAL_COMMUNITY)
Admission: RE | Disposition: A | Discharge status: Home Health | Payer: Self-pay | Source: Ambulatory Visit | Attending: Internal Medicine

## 2011-06-02 ENCOUNTER — Ambulatory Visit (HOSPITAL_COMMUNITY)
Admission: RE | Admit: 2011-06-02 | Discharge: 2011-06-02 | Disposition: A | Discharge status: Home Health | Payer: PRIVATE HEALTH INSURANCE | Source: Ambulatory Visit | Attending: Internal Medicine | Admitting: Internal Medicine

## 2011-06-02 DIAGNOSIS — R1310 Dysphagia, unspecified: Secondary | ICD-10-CM

## 2011-06-02 DIAGNOSIS — Z79899 Other long term (current) drug therapy: Secondary | ICD-10-CM | POA: Insufficient documentation

## 2011-06-02 DIAGNOSIS — I10 Essential (primary) hypertension: Secondary | ICD-10-CM | POA: Insufficient documentation

## 2011-06-02 DIAGNOSIS — E7800 Pure hypercholesterolemia, unspecified: Secondary | ICD-10-CM | POA: Insufficient documentation

## 2011-06-02 DIAGNOSIS — K222 Esophageal obstruction: Secondary | ICD-10-CM | POA: Insufficient documentation

## 2011-06-02 DIAGNOSIS — K2940 Chronic atrophic gastritis without bleeding: Secondary | ICD-10-CM

## 2011-06-02 DIAGNOSIS — A048 Other specified bacterial intestinal infections: Secondary | ICD-10-CM | POA: Insufficient documentation

## 2011-06-02 HISTORY — PX: ESOPHAGOGASTRODUODENOSCOPY: SHX1529

## 2011-06-02 SURGERY — ESOPHAGOGASTRODUODENOSCOPY (EGD) WITH ESOPHAGEAL DILATION
Anesthesia: Moderate Sedation

## 2011-06-02 MED ORDER — BUTAMBEN-TETRACAINE-BENZOCAINE 2-2-14 % EX AERO
INHALATION_SPRAY | CUTANEOUS | Status: DC | PRN
  Administered 2011-06-02: 2 via TOPICAL

## 2011-06-02 MED ORDER — SODIUM CHLORIDE 0.45 % IV SOLN
Freq: Once | INTRAVENOUS | Status: AC
  Administered 2011-06-02: 1000 mL via INTRAVENOUS

## 2011-06-02 MED ORDER — MIDAZOLAM HCL 5 MG/5ML IJ SOLN
INTRAMUSCULAR | Status: DC | PRN
  Administered 2011-06-02: 2 mg via INTRAVENOUS
  Administered 2011-06-02: 1 mg via INTRAVENOUS
  Administered 2011-06-02: 2 mg via INTRAVENOUS

## 2011-06-02 MED ORDER — MIDAZOLAM HCL 5 MG/5ML IJ SOLN
INTRAMUSCULAR | Status: AC
  Filled 2011-06-02: qty 10

## 2011-06-02 MED ORDER — MEPERIDINE HCL 100 MG/ML IJ SOLN
INTRAMUSCULAR | Status: AC
  Filled 2011-06-02: qty 2

## 2011-06-02 MED ORDER — SUCRALFATE 1 GM/10ML PO SUSP: 1.0000 g | Freq: Four times a day (QID) | ORAL | Status: DC

## 2011-06-02 MED ORDER — SIMETHICONE 40 MG/0.6ML PO SUSP
ORAL | Status: DC | PRN
  Administered 2011-06-02: 12:00:00

## 2011-06-02 MED ORDER — MEPERIDINE HCL 100 MG/ML IJ SOLN
INTRAMUSCULAR | Status: DC | PRN
  Administered 2011-06-02: 50 mg via INTRAVENOUS
  Administered 2011-06-02 (×2): 25 mg via INTRAVENOUS

## 2011-06-02 NOTE — Op Note (Signed)
[facility] [address] [place], [place]  [address]  ENDOSCOPY PROCEDURE REPORT  PATIENT:  Bethany Wang, Bethany Wang  MR#:  [id number] BIRTHDATE:  11-10-1965, 45 yrs. old  GENDER:  female  ENDOSCOPIST:  R. Roetta Sessions, MD Caleen Essex Referred by:  Kirstie Peri, M.D.  PROCEDURE DATE:  06/02/2011 PROCEDURE:  EGD with Elease Hashimoto dilation and esophageal/gastric biopsy  INDICATIONS:  esophageal dysphagia. Sensation of esophageal pill impaction  INFORMED CONSENT:   The risks, benefits, limitations, alternatives and imponderables have been discussed.  The potential for biopsy, esophogeal dilation, etc. have also been reviewed.  Questions have been answered.  All parties agreeable.  Please see the history and physical in the medical record for more information.  MEDICATIONS:  Versed 5 mg IV and Demerol 100 mg IV in divided doses. Cetacaine spray.  DESCRIPTION OF PROCEDURE:   The EG-[id number] ([id number]) endoscope was introduced through the mouth and advanced to the second portion of the duodenum without difficulty or limitations.  The mucosal surfaces were surveyed very carefully during advancement of the scope and upon withdrawal.  Retroflexion view of the proximal stomach and esophagogastric junction was performed.  <<PROCEDUREIMAGES>>  FINDINGS:  partially dissolved pill or capsule in the upper esophageal sphincter suctioned out/lavaged down into the stomach. Possible cervical esophageal web present. Some residue, possibly representing medication dissolution residue, overlying mucosa in the mid esophagus. No plaques seen. There were tiny erosions at the GE junction. Diffuse antral and body erosions with patchy erythema. No ulcer or infiltrating process. Patent pylorus. Normal first and second portion of the duodenum.  THERAPEUTIC / DIAGNOSTIC MANEUVERS PERFORMED:  A 54 French Maloney dilator was passed at the insertion  with ease.  A look back revealed a tear through the UES mucosa  with a suggestion of a cervical web or ring having been ruptured.   Swelling, Biopsies of the Gastric and Esophageal Mucosa Were Taken for Histologic Study.  COMPLICATIONS:   None  IMPRESSION:     Recent pill impaction as described above. Status post dilation of a probable cervical esophageal web. Biopsy of abnormal esophageal and gastric mucosa.  RECOMMENDATIONS:   Swallowing precautions reviewed.  Continue Prilosec  Carafate suspension 1 g 4 times a day x5 days  Follow up on pathology.  ______________________________ R. Roetta Sessions, MD Caleen Essex  CC:  n. eSIGNED:   R. Roetta Sessions at 06/02/2011 12:53 PM  Knute Neu,   [phone]

## 2011-06-02 NOTE — Progress Notes (Signed)
Faxed to PCP

## 2011-06-02 NOTE — H&P (View-Only) (Signed)
Referring Provider: Kirstie Peri, MD Primary Care Physician:  Kirstie Peri, MD, MD Primary Gastroenterologist:  Dr. Jena Gauss  Chief Complaint  Patient presents with  . Dysphagia    HPI:   [name] is an animated 46 year old female who presents at the request of Dr. Sherryll Burger secondary to dysphagia for several months. When I first met her, she reported an EGD by Dr. Jena Gauss a few years ago with dilation. I have looked in epic, centricity, and echart for this documentation. I do not have this currently. Apparently, she also had an EGD in 2001 with Dr. Madilyn Fireman, possibly an EGD in 2006 with Dr. Katrinka Blazing. I do not have the reports from 2006.  Regardless, she states she felt significantly better after her last EGD with Dr. Jena Gauss (again, I am unable to find this). Reports dysphagia now X 3 mos. Unable to swallow pills. Difficulty with breads, chicken, meats, fresh veggies. Has to regurgitate. No odynophagia. Reflux controlled with Prilosec. Amitiza for constipation. Reports seeing Dr. Teena Dunk a few months ago and proceeding with what sounds like a barium swallow. She is quite adamant that she will not return there.    Interestingly, she told me today that she had a history of breast cancer, uterine cancer, and a brain tumor. I recorded this in her history; however, upon reviewing her records, it appears this is not the case. She has a hx of a benign breast mass requiring lumpectomy, fibroids requiring hysterectomy. I have reviewed past scans and am unable to find any mention of a brain tumor. 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 She states that due to this brain tumor, she has seizures.   Upon further review of her chart, it appears she had presented to the ED multiple times in the past for various ailments. She also has a hx of suicide ideations in the remote past. I question a psychiatric component, as she has proclaimed she has had cancer and "been in remission  for 10 years" when this is clearly not the case.   Past Medical History  Diagnosis Date  . 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 Seizures   . Hypertension   . Hypercholesterolemia   . GERD (gastroesophageal reflux disease)   . Suicide attempt     3 attempts in remote past  . PUD (peptic ulcer disease)     Possible?    Past Surgical History  Procedure Date  . Abdominal hysterectomy     fibroids  . Bladder surgery   . Shoulder surgery     left  . Hand surgery     right  . Eye surgery     removed glass  . Left breast lumpectomy     benign  . Colonoscopy May 2010    [facility]: normal rectum, internal hemorrhoids, , benign colonic polyp  . Esophagogastroduodenoscopy     2001 Dr. Madilyn Fireman: distal esophagitis, small hiatal hernia,. Dr. Katrinka Blazing 2006? no records available currently, pt also reports  EGD a few years ago with Dr. Jena Gauss, do not have these reports anywhere in medical records    Current Outpatient Prescriptions  Medication Sig Dispense Refill  . amitriptyline (ELAVIL) 25 MG tablet Take 25 mg by mouth at bedtime.      [name] amLODipine (NORVASC) 2.5 MG tablet Take 2.5 mg by mouth daily.      [name] atorvastatin (LIPITOR) 10 MG tablet Take 10 mg by mouth daily.      [name]  budesonide-formoterol (SYMBICORT) 160-4.5 MCG/ACT inhaler Inhale 2 puffs into the lungs 2 (two) times daily.      . carisoprodol (SOMA) 350 MG tablet Take 350 mg by mouth 4 (four) times daily as needed.      . cetirizine (ZYRTEC) 10 MG tablet Take 10 mg by mouth daily.      . DULoxetine (CYMBALTA) 60 MG capsule Take 60 mg by mouth daily.      . fexofenadine (ALLEGRA) 180 MG tablet Take 180 mg by mouth daily.      . fluticasone (FLONASE) 50 MCG/ACT nasal spray Place 2 sprays into the nose daily.      . hydrocortisone (ANUSOL-HC) 25 MG suppository Place 25 mg rectally 2 (two) times daily.      [name] ibuprofen (ADVIL,MOTRIN) 200 MG tablet Take 200 mg by mouth every 6 (six) hours as needed.      [name]  lisinopril-hydrochlorothiazide (PRINZIDE,ZESTORETIC) 20-12.5 MG per tablet Take 1 tablet by mouth daily.      [name] lubiprostone (AMITIZA) 24 MCG capsule Take 24 mcg by mouth 2 (two) times daily with a meal.      . omeprazole (PRILOSEC) 20 MG capsule Take 20 mg by mouth daily.        Allergies as of 06/01/2011 - Review Complete 06/01/2011  Allergen Reaction Noted  . Penicillins Anaphylaxis 01/26/2011    Family History  Problem Relation Age of Onset  . Colon cancer Paternal Grandfather     History   Social History  . Marital Status: Single    Spouse Name: N/A    Number of Children: N/A  . Years of Education: N/A   Occupational History  . Not on file.   Social History Main Topics  . Smoking status: Current Everyday Smoker -- 0.2 packs/day    Types: Cigarettes  . Smokeless tobacco: Not on file  . Alcohol Use: No  . Drug Use: No  . Sexually Active: Not on file   Other Topics Concern  . Not on file   Social History Narrative  . No narrative on file    Review of Systems: Gen: Denies any fever, chills, loss of appetite, fatigue, weight loss. CV: Denies chest pain, heart palpitations, syncope, peripheral edema. Resp: Denies shortness of breath with rest, cough, wheezing GI: SEE HPI GU : Denies urinary burning, urinary frequency, urinary incontinence.  MS: Denies joint pain, muscle weakness, cramps, limited movement Derm: Denies rash, itching, dry skin Psych: Denies depression, anxiety, confusion or memory loss  Heme: Denies bruising, bleeding, and enlarged lymph nodes.  Physical Exam: BP 143/92  Pulse 73  Temp(Src) 98.2 F (36.8 C) (Temporal)  Ht 5\' 5"  (1.651 m)  Wt 183 lb 6.4 oz (83.19 kg)  BMI 30.52 kg/m2 General:   Alert and oriented. Well-developed, well-nourished, pleasant and cooperative. Head:  Normocephalic and atraumatic. Eyes:  Conjunctiva pink, sclera clear, no icterus.   Conjunctiva pink. Ears:  Normal auditory acuity. Nose:  No deformity, discharge,   or lesions. Mouth:  No deformity or lesions, mucosa pink and moist.  Neck:  Supple, without mass or thyromegaly. Lungs:  Clear to auscultation bilaterally, without wheezing, rales, or rhonchi.  Heart:  S1, S2 present without murmurs noted.  Abdomen:  +BS, soft, non-tender and non-distended. Without mass or HSM. No rebound or guarding. No hernias noted. Rectal:  Deferred  Msk:  Symmetrical without gross deformities. Normal posture. Extremities:  Without clubbing or edema. Neurologic:  Alert and  oriented x4;  grossly normal neurologically. Skin:  Intact,  warm and dry without significant lesions or rashes Cervical Nodes:  No significant cervical adenopathy. Psych:  Alert and cooperative. Normal mood and affect. Becomes quite dramatic and animated when discussing past hospital experiences, medical hx.

## 2011-06-02 NOTE — Interval H&P Note (Signed)
History and Physical Interval Note:  06/02/2011 12:21 PM  Bethany Wang  has presented today for surgery, with the diagnosis of dysphagia  The various methods of treatment have been discussed with the patient and family. After consideration of risks, benefits and other options for treatment, the patient has consented to  Procedure(s) (LRB): ESOPHAGOGASTRODUODENOSCOPY (EGD) WITH ESOPHAGEAL DILATION (N/A) as a surgical intervention .  The patients' history has been reviewed, patient examined, no change in status, stable for surgery.  I have reviewed the patients' chart and labs.  Questions were answered to the patient's satisfaction.     [name]

## 2011-06-02 NOTE — Discharge Instructions (Signed)
EGD Discharge instructions Please read the instructions outlined below and refer to this sheet in the next few weeks. These discharge instructions provide you with general information on caring for yourself after you leave the hospital. Your doctor may also give you specific instructions. While your treatment has been planned according to the most current medical practices available, unavoidable complications occasionally occur. If you have any problems or questions after discharge, please call your doctor. ACTIVITY  You may resume your regular activity but move at a slower pace for the next 24 hours.   Take frequent rest periods for the next 24 hours.   Walking will help expel (get rid of) the air and reduce the bloated feeling in your abdomen.   No driving for 24 hours (because of the anesthesia (medicine) used during the test).   You may shower.   Do not sign any important legal documents or operate any machinery for 24 hours (because of the anesthesia used during the test).  NUTRITION  Drink plenty of fluids.   You may resume your normal diet.   Begin with a light meal and progress to your normal diet.   Avoid alcoholic beverages for 24 hours or as instructed by your caregiver.  MEDICATIONS  You may resume your normal medications unless your caregiver tells you otherwise.  WHAT YOU CAN EXPECT TODAY  You may experience abdominal discomfort such as a feeling of fullness or "gas" pains.  FOLLOW-UP  Your doctor will discuss the results of your test with you.  SEEK IMMEDIATE MEDICAL ATTENTION IF ANY OF THE FOLLOWING OCCUR:  Excessive nausea (feeling sick to your stomach) and/or vomiting.   Severe abdominal pain and distention (swelling).   Trouble swallowing.   Temperature over 101 F (37.8 C).   Rectal bleeding or vomiting of blood   Continue Prilosec.  Swallowing precautions reviewed.  Carafate suspension 4 times a day x5 days  Further recommendations to follow  pending review of pathology report  .

## 2011-06-06 ENCOUNTER — Encounter: Payer: Self-pay | Admitting: Internal Medicine

## 2011-06-08 ENCOUNTER — Encounter: Payer: Self-pay | Admitting: Internal Medicine

## 2011-06-08 NOTE — Progress Notes (Signed)
Path & Letter Cc to PCP

## 2011-06-08 NOTE — Progress Notes (Unsigned)
Tried to call pt- LM with Female to return call. Letter in mail to pt.   Benedetto Goad, please cc letter and path to PCP.   Letter from: Corbin Ade Reason for Letter: Results Review Send letter to patient.  Send copy of letter with path to referring provider and PCP.  Raynelle Fanning,  Need rx for hp:  Flagyl 500 mg bid x 14 days, Biaxin 500mg  bid x 14 days; prevacid 30 mg bid x 14 days no refills- generic ok; hold omeprazole and statin during treatment, then resume        F

## 2011-06-09 NOTE — Progress Notes (Signed)
Pt aware, Rx called in to [facility] pharmacy.

## 2011-07-12 ENCOUNTER — Telehealth: Payer: Self-pay | Admitting: Internal Medicine

## 2011-07-12 ENCOUNTER — Other Ambulatory Visit: Payer: Self-pay | Admitting: Gastroenterology

## 2011-07-12 NOTE — Telephone Encounter (Signed)
BPE scheduled for tomorrow @ 8:30- pt aware

## 2011-07-12 NOTE — Telephone Encounter (Signed)
Pt informed

## 2011-07-12 NOTE — Telephone Encounter (Signed)
Pt said she cannot get the pills down even if she cuts them in half. She said they get stuck in  Her esophagus and just will not go down with anything she tries to get them down. She said that she saw Dr. Sherryll Burger for nosebleeds and he said that they were coming from the strong antibiotics that she had been taking. She still has #15 of each of Flagyl, Biaxin, Prevacid. Please advise!

## 2011-07-12 NOTE — Telephone Encounter (Signed)
Please call patient. Please find out why she can't "get down pills"? Are they too big? Can they be broken in half? She should see Dr. Sherryll Burger for her nosebleeds There are no IV antibiotics for this

## 2011-07-12 NOTE — Telephone Encounter (Signed)
Let's get a barium pill esophagram tomorrow if possible Reason recurrent dysphagia status post dilation esophageal web Thanks

## 2011-07-12 NOTE — Telephone Encounter (Signed)
Patient stoped by the office today c/o not being able to take the antibiotics for the H-Pylori and her home health nurse is saying that she needs to be put on intravenous antibiotics the pills are getting stuck in her throat an she ends up in the ER getting them sucked out shes having nose bleeds with the antibiotics patient said she went back to Dr. Sherryll Burger for advise instead of coming here please advise??

## 2011-07-13 ENCOUNTER — Telehealth: Payer: Self-pay | Admitting: Urgent Care

## 2011-07-13 ENCOUNTER — Ambulatory Visit (HOSPITAL_COMMUNITY)
Admission: RE | Admit: 2011-07-13 | Discharge: 2011-07-13 | Disposition: A | Discharge status: Home / Self Care | Payer: PRIVATE HEALTH INSURANCE | Source: Ambulatory Visit | Attending: Urgent Care | Admitting: Urgent Care

## 2011-07-13 DIAGNOSIS — Q391 Atresia of esophagus with tracheo-esophageal fistula: Secondary | ICD-10-CM | POA: Insufficient documentation

## 2011-07-13 DIAGNOSIS — Q393 Congenital stenosis and stricture of esophagus: Secondary | ICD-10-CM | POA: Insufficient documentation

## 2011-07-13 DIAGNOSIS — R1310 Dysphagia, unspecified: Secondary | ICD-10-CM | POA: Insufficient documentation

## 2011-07-13 NOTE — Telephone Encounter (Signed)
Pt aware of what was said. She is going to call the pharmacist and ask them about the pills. She will call us back if she has any more problems,

## 2011-07-13 NOTE — Telephone Encounter (Signed)
Please call patient and let her that there was nothing obstructing her esophagus since Dr. Jena Gauss stretched it. She does have a "Tiny web" but the pill is passing through just fine. I would suggest talking to her pharmacist about whether the pills can be cut in half or thirds. She really needs to complete all his medication and only has a few doses left. H pylori can cause cancer if left untreated. If swallowing problems persist, she needs an appointment with Dr. Jena Gauss. Thanks

## 2011-08-06 ENCOUNTER — Other Ambulatory Visit: Payer: Self-pay | Admitting: Internal Medicine

## 2011-08-06 DIAGNOSIS — Z1231 Encounter for screening mammogram for malignant neoplasm of breast: Secondary | ICD-10-CM

## 2011-10-04 ENCOUNTER — Ambulatory Visit
Admission: RE | Admit: 2011-10-04 | Discharge: 2011-10-04 | Disposition: A | Discharge status: Home / Self Care | Payer: PRIVATE HEALTH INSURANCE | Source: Ambulatory Visit | Attending: Internal Medicine | Admitting: Internal Medicine

## 2011-10-04 DIAGNOSIS — Z1231 Encounter for screening mammogram for malignant neoplasm of breast: Secondary | ICD-10-CM

## 2012-04-03 ENCOUNTER — Encounter (HOSPITAL_COMMUNITY)
Admission: RE | Disposition: A | Discharge status: Home / Self Care | Payer: Self-pay | Source: Ambulatory Visit | Attending: Cardiology

## 2012-04-03 ENCOUNTER — Observation Stay (HOSPITAL_COMMUNITY)
Admission: RE | Admit: 2012-04-03 | Discharge: 2012-04-05 | DRG: 247 | Disposition: A | Discharge status: Home / Self Care | Payer: PRIVATE HEALTH INSURANCE | Source: Ambulatory Visit | Attending: Cardiology | Admitting: Cardiology

## 2012-04-03 ENCOUNTER — Other Ambulatory Visit: Payer: Self-pay | Admitting: Physician Assistant

## 2012-04-03 ENCOUNTER — Encounter: Payer: Self-pay | Admitting: Cardiology

## 2012-04-03 ENCOUNTER — Encounter (HOSPITAL_COMMUNITY): Payer: Self-pay

## 2012-04-03 ENCOUNTER — Inpatient Hospital Stay (HOSPITAL_COMMUNITY)
Admission: AD | Admit: 2012-04-03 | Payer: Self-pay | Source: Other Acute Inpatient Hospital | Admitting: Internal Medicine

## 2012-04-03 DIAGNOSIS — Z955 Presence of coronary angioplasty implant and graft: Secondary | ICD-10-CM

## 2012-04-03 DIAGNOSIS — E7800 Pure hypercholesterolemia, unspecified: Secondary | ICD-10-CM | POA: Diagnosis present

## 2012-04-03 DIAGNOSIS — E876 Hypokalemia: Secondary | ICD-10-CM | POA: Diagnosis present

## 2012-04-03 DIAGNOSIS — I2510 Atherosclerotic heart disease of native coronary artery without angina pectoris: Principal | ICD-10-CM | POA: Diagnosis present

## 2012-04-03 DIAGNOSIS — I200 Unstable angina: Secondary | ICD-10-CM

## 2012-04-03 DIAGNOSIS — H409 Unspecified glaucoma: Secondary | ICD-10-CM | POA: Diagnosis present

## 2012-04-03 DIAGNOSIS — K219 Gastro-esophageal reflux disease without esophagitis: Secondary | ICD-10-CM | POA: Diagnosis present

## 2012-04-03 DIAGNOSIS — E119 Type 2 diabetes mellitus without complications: Secondary | ICD-10-CM | POA: Diagnosis present

## 2012-04-03 DIAGNOSIS — F17200 Nicotine dependence, unspecified, uncomplicated: Secondary | ICD-10-CM | POA: Diagnosis present

## 2012-04-03 DIAGNOSIS — I10 Essential (primary) hypertension: Secondary | ICD-10-CM | POA: Diagnosis present

## 2012-04-03 LAB — GLUCOSE, CAPILLARY
Glucose-Capillary: 95 mg/dL (ref 70–99)
Glucose-Capillary: 147 mg/dL — ABNORMAL HIGH (ref 70–99)

## 2012-04-03 LAB — MRSA PCR SCREENING: MRSA by PCR: NEGATIVE

## 2012-04-03 LAB — POCT ACTIVATED CLOTTING TIME: Activated Clotting Time: 355 seconds

## 2012-04-03 SURGERY — LEFT HEART CATHETERIZATION WITH CORONARY ANGIOGRAM
Anesthesia: LOCAL

## 2012-04-03 MED ORDER — ATORVASTATIN CALCIUM 10 MG PO TABS
10.0000 mg | ORAL_TABLET | Freq: Every day | ORAL | Status: DC
  Administered 2012-04-03 – 2012-04-04 (×2): 10 mg via ORAL
  Filled 2012-04-03 (×3): qty 1

## 2012-04-03 MED ORDER — ASPIRIN EC 81 MG PO TBEC: 81.0000 mg | DELAYED_RELEASE_TABLET | Freq: Every day | ORAL | Status: DC

## 2012-04-03 MED ORDER — NITROGLYCERIN 0.4 MG SL SUBL: 0.4000 mg | SUBLINGUAL_TABLET | SUBLINGUAL | Status: DC | PRN

## 2012-04-03 MED ORDER — SODIUM CHLORIDE 0.9 % IJ SOLN: 3.0000 mL | INTRAMUSCULAR | Status: DC | PRN

## 2012-04-03 MED ORDER — VENLAFAXINE HCL ER 37.5 MG PO CP24
37.5000 mg | ORAL_CAPSULE | Freq: Every morning | ORAL | Status: DC
  Administered 2012-04-04 – 2012-04-05 (×2): 37.5 mg via ORAL
  Filled 2012-04-03 (×2): qty 1

## 2012-04-03 MED ORDER — SUCRALFATE 1 GM/10ML PO SUSP
1.0000 g | Freq: Four times a day (QID) | ORAL | Status: DC
  Administered 2012-04-03: 1 g via ORAL
  Filled 2012-04-03 (×2): qty 10

## 2012-04-03 MED ORDER — CARISOPRODOL 350 MG PO TABS: 350.0000 mg | ORAL_TABLET | Freq: Four times a day (QID) | ORAL | Status: DC | PRN

## 2012-04-03 MED ORDER — LORATADINE 10 MG PO TABS
10.0000 mg | ORAL_TABLET | Freq: Every day | ORAL | Status: DC
  Filled 2012-04-03: qty 1

## 2012-04-03 MED ORDER — PANTOPRAZOLE SODIUM 40 MG PO TBEC
40.0000 mg | DELAYED_RELEASE_TABLET | Freq: Every day | ORAL | Status: DC
  Administered 2012-04-04 – 2012-04-05 (×2): 40 mg via ORAL
  Filled 2012-04-03: qty 1

## 2012-04-03 MED ORDER — PANTOPRAZOLE SODIUM 40 MG PO TBEC: 40.0000 mg | DELAYED_RELEASE_TABLET | Freq: Every day | ORAL | Status: DC

## 2012-04-03 MED ORDER — LISINOPRIL 20 MG PO TABS
20.0000 mg | ORAL_TABLET | Freq: Every day | ORAL | Status: DC
  Administered 2012-04-03 – 2012-04-05 (×3): 20 mg via ORAL
  Filled 2012-04-03 (×3): qty 1

## 2012-04-03 MED ORDER — ACETAMINOPHEN 325 MG PO TABS
650.0000 mg | ORAL_TABLET | ORAL | Status: DC | PRN
  Administered 2012-04-03: 650 mg via ORAL
  Filled 2012-04-03: qty 2

## 2012-04-03 MED ORDER — LUBIPROSTONE 24 MCG PO CAPS: 24.0000 ug | ORAL_CAPSULE | Freq: Every morning | ORAL | Status: DC

## 2012-04-03 MED ORDER — BIVALIRUDIN 250 MG IV SOLR
INTRAVENOUS | Status: AC
  Filled 2012-04-03: qty 250

## 2012-04-03 MED ORDER — ASPIRIN 81 MG PO CHEW
81.0000 mg | CHEWABLE_TABLET | Freq: Every day | ORAL | Status: DC
  Administered 2012-04-04 – 2012-04-05 (×2): 81 mg via ORAL
  Filled 2012-04-03 (×2): qty 1

## 2012-04-03 MED ORDER — INSULIN ASPART 100 UNIT/ML ~~LOC~~ SOLN: 0.0000 [IU] | Freq: Three times a day (TID) | SUBCUTANEOUS | Status: DC

## 2012-04-03 MED ORDER — SODIUM CHLORIDE 0.9 % IV SOLN: 250.0000 mL | INTRAVENOUS | Status: DC | PRN

## 2012-04-03 MED ORDER — TICAGRELOR 90 MG PO TABS
ORAL_TABLET | ORAL | Status: AC
  Administered 2012-04-04: 90 mg via ORAL
  Filled 2012-04-03: qty 2

## 2012-04-03 MED ORDER — TICAGRELOR 90 MG PO TABS
90.0000 mg | ORAL_TABLET | Freq: Two times a day (BID) | ORAL | Status: DC
  Administered 2012-04-03 – 2012-04-05 (×4): 90 mg via ORAL
  Filled 2012-04-03 (×5): qty 1

## 2012-04-03 MED ORDER — HYDROCHLOROTHIAZIDE 12.5 MG PO CAPS
12.5000 mg | ORAL_CAPSULE | Freq: Every day | ORAL | Status: DC
  Administered 2012-04-03 – 2012-04-05 (×3): 12.5 mg via ORAL
  Filled 2012-04-03 (×3): qty 1

## 2012-04-03 MED ORDER — ONDANSETRON HCL 4 MG/2ML IJ SOLN: 4.0000 mg | Freq: Four times a day (QID) | INTRAMUSCULAR | Status: DC | PRN

## 2012-04-03 MED ORDER — SODIUM CHLORIDE 0.9 % IJ SOLN
3.0000 mL | Freq: Two times a day (BID) | INTRAMUSCULAR | Status: DC
  Administered 2012-04-04: 3 mL via INTRAVENOUS

## 2012-04-03 MED ORDER — ATORVASTATIN CALCIUM 10 MG PO TABS: 10.0000 mg | ORAL_TABLET | Freq: Every day | ORAL | Status: DC

## 2012-04-03 MED ORDER — FENTANYL CITRATE 0.05 MG/ML IJ SOLN
INTRAMUSCULAR | Status: AC
  Filled 2012-04-03: qty 2

## 2012-04-03 MED ORDER — LIDOCAINE HCL (PF) 1 % IJ SOLN
INTRAMUSCULAR | Status: AC
  Filled 2012-04-03: qty 30

## 2012-04-03 MED ORDER — SODIUM CHLORIDE 0.9 % IV SOLN
INTRAVENOUS | Status: DC
  Administered 2012-04-03: 125 mL via INTRAVENOUS

## 2012-04-03 MED ORDER — MIDAZOLAM HCL 2 MG/2ML IJ SOLN
INTRAMUSCULAR | Status: AC
  Filled 2012-04-03: qty 2

## 2012-04-03 MED ORDER — ACETAMINOPHEN 325 MG PO TABS: 650.0000 mg | ORAL_TABLET | ORAL | Status: DC | PRN

## 2012-04-03 MED ORDER — AMITRIPTYLINE HCL 25 MG PO TABS
25.0000 mg | ORAL_TABLET | Freq: Every day | ORAL | Status: DC
  Administered 2012-04-04: 25 mg via ORAL
  Filled 2012-04-03 (×3): qty 1

## 2012-04-03 MED ORDER — NITROGLYCERIN 2 % TD OINT
1.0000 [in_us] | TOPICAL_OINTMENT | Freq: Four times a day (QID) | TRANSDERMAL | Status: DC
  Administered 2012-04-03: 1 [in_us] via TOPICAL
  Filled 2012-04-03: qty 30

## 2012-04-03 MED ORDER — LUBIPROSTONE 24 MCG PO CAPS
24.0000 ug | ORAL_CAPSULE | Freq: Two times a day (BID) | ORAL | Status: DC
Start: 2012-04-04 — End: 2012-04-04
  Filled 2012-04-03: qty 1

## 2012-04-03 MED ORDER — BUDESONIDE-FORMOTEROL FUMARATE 160-4.5 MCG/ACT IN AERO
2.0000 | INHALATION_SPRAY | Freq: Two times a day (BID) | RESPIRATORY_TRACT | Status: DC
  Filled 2012-04-03: qty 6

## 2012-04-03 MED ORDER — AMITRIPTYLINE HCL 25 MG PO TABS
25.0000 mg | ORAL_TABLET | Freq: Every day | ORAL | Status: DC
  Administered 2012-04-03: 25 mg via ORAL
  Filled 2012-04-03: qty 1

## 2012-04-03 MED ORDER — DULOXETINE HCL 60 MG PO CPEP
60.0000 mg | ORAL_CAPSULE | Freq: Every day | ORAL | Status: DC
  Filled 2012-04-03: qty 1

## 2012-04-03 MED ORDER — LISINOPRIL-HYDROCHLOROTHIAZIDE 20-12.5 MG PO TABS: 1.0000 | ORAL_TABLET | Freq: Every day | ORAL | Status: DC

## 2012-04-03 MED ORDER — HEPARIN (PORCINE) IN NACL 2-0.9 UNIT/ML-% IJ SOLN
INTRAMUSCULAR | Status: AC
  Filled 2012-04-03: qty 1000

## 2012-04-03 MED ORDER — AMLODIPINE BESYLATE 2.5 MG PO TABS
2.5000 mg | ORAL_TABLET | Freq: Every day | ORAL | Status: DC
  Administered 2012-04-04 – 2012-04-05 (×2): 2.5 mg via ORAL
  Filled 2012-04-03 (×3): qty 1

## 2012-04-03 MED ORDER — SODIUM CHLORIDE 0.9 % IV SOLN: INTRAVENOUS | Status: DC

## 2012-04-03 MED ORDER — FLUTICASONE PROPIONATE 50 MCG/ACT NA SUSP
2.0000 | Freq: Every day | NASAL | Status: DC
  Filled 2012-04-03: qty 16

## 2012-04-03 MED ORDER — INSULIN ASPART 100 UNIT/ML ~~LOC~~ SOLN: 0.0000 [IU] | Freq: Every day | SUBCUTANEOUS | Status: DC

## 2012-04-03 MED ORDER — AMLODIPINE BESYLATE 5 MG PO TABS: 5.0000 mg | ORAL_TABLET | Freq: Every day | ORAL | Status: DC

## 2012-04-03 MED ORDER — NITROGLYCERIN 0.2 MG/ML ON CALL CATH LAB
INTRAVENOUS | Status: AC
  Filled 2012-04-03: qty 1

## 2012-04-03 NOTE — Interval H&P Note (Signed)
History and Physical Interval Note:  04/03/2012 2:43 PM  Bethany Wang  has presented today for surgery, with the diagnosis of chest pain  The various methods of treatment have been discussed with the patient and family. After consideration of risks, benefits and other options for treatment, the patient has consented to  Procedure(s) (LRB) with comments: LEFT HEART CATHETERIZATION WITH CORONARY ANGIOGRAM (N/A) as a surgical intervention .  The patient's history has been reviewed, patient examined, no change in status, stable for surgery.  I have reviewed the patient's chart and labs.  Questions were answered to the patient's satisfaction.     [name]

## 2012-04-03 NOTE — H&P (Signed)
  Patient was transferred down by the [facility] team for cardiac cath.  History is in paper chart and will be filed by Mr. Serpe.   Presents for cardiac cath.  Has had ongoing chest pain with current negative enzymes.  Has multiple cardiac enzymes.  Now transferred for further evaluation.   Allergies include PCN.  Has multiple medical issues as noted.   Patient examined.  Full note to follow.  TS

## 2012-04-03 NOTE — Progress Notes (Signed)
Patient refusing to have any more blood pressures checked tonight.Patient wanting to go home. me

## 2012-04-03 NOTE — Progress Notes (Signed)
Pt upset that IV had to be replaced and does not want BP cuff on her arm at all times.  She agreed to allow BPs to be checked if cuff is not left on - nursing is willing to do this

## 2012-04-03 NOTE — CV Procedure (Signed)
  Cardiac Catheterization Procedure Note  Name: Bethany Wang MRN: [id number] DOB: 1966-02-21  Procedure: Left Heart Cath, Selective Coronary Angiography, LV angiography,  PTCA/Stent of RCA  Indication: recurrent cp with multiple CRF, ongoing pain in the holding area.     Diagnostic Procedure Details: The right groin was prepped, draped, and anesthetized with 1% lidocaine. Using the modified Seldinger technique, a 4 French sheath was introduced into the right femoral artery. Standard Judkins catheters were used for selective coronary angiography and left ventriculography. Catheter exchanges were performed over a wire.  The diagnostic procedure was well-tolerated without immediate complications.  PROCEDURAL FINDINGS Hemodynamics: AO 127/76 (96) LV 135/12 No gradient  Coronary angiography: Coronary dominance: right  Left mainstem: No significant obstruction  Left anterior descending (LAD): 60-70% eccentric lesion in the mid vessel at takeoff of small moderate diagonal branch.  Vessel is heavily calcified.  The distal vessel is without critical disease.    Left circumflex (LCx): Provides three OM branches.  There is mild 30% irregularity in the OM 1.  The AV circ between OM2 and OM3 has about 30-40% eccentric plaque that does not appear flow limiting.    Right coronary artery (RCA): This is a large caliber vessel with tandem 95% mid vessel lesions segmentally with hypodensity in the proximal lesions..  The distal vessel has an eccentric 30-40% lesion.   There is mild scattered plaque that is non obstructive throughout the distal vessel, and the PDA and PLA are moderate vessels with mild luminal irregularity.    Following PCI to the mid vessel, there was 0% residual stenosis.    Left ventriculography: Left ventricular systolic function is normal, LVEF is estimated at 55-65%, there is no significant mitral regurgitation   PCI Procedure Note:  Following the diagnostic procedure, the decision  was made to proceed with PCI. The sheath was upsized to a 6 [place]. Weight-based bivalirudin was given for anticoagulation. Once a therapeutic ACT was achieved, a 6 [place] [facility] guide catheter was inserted.  A prowater coronary guidewire was used to cross the lesion.  The lesion was predilated with a 2.78mm compliant  balloon.  The lesion was then stented with a 2.5  X  38 mm Promus Premier stent.  The stent was postdilated with a 2.58mm noncompliant balloon to high pressures.  Following PCI, there was 0% residual stenosis and TIMI-3 flow. Final angiography confirmed an excellent result. Femoral sheath secured into place.  The patient tolerated the PCI procedure well. There were no immediate procedural complications.  The patient was transferred to the post catheterization recovery area for further monitoring.  PCI Data: Vessel - RCA/Segment - 2 Percent Stenosis (pre)  95 times 2 TIMI-flow 3 Stent Promus Premier 2.5 by 38mm Percent Stenosis (post) 0% TIMI-flow (post) 3  Final Conclusions:   1.  CAD with severe RCA disease and moderate LAD disease 2.  Preserved LV function   Recommendations:  1.  DC smoking 2.  DAPT times one year if tolerated. 3.  Would recommend follow up routine stress testing to assess residual LAD ischemia.  She has been counseled extensively about tobacco cessation, stent considerations, need for follow up with primary care, and long term risk reduction.  She will also need to be monitored for hematuria with prior history of urinary bleeding.     Bethany Wang 04/03/2012, 4:23 PM

## 2012-04-04 DIAGNOSIS — I2510 Atherosclerotic heart disease of native coronary artery without angina pectoris: Secondary | ICD-10-CM

## 2012-04-04 DIAGNOSIS — I200 Unstable angina: Secondary | ICD-10-CM

## 2012-04-04 LAB — CBC
HCT: 32.9 % — ABNORMAL LOW (ref 36.0–46.0)
Hemoglobin: 11 g/dL — ABNORMAL LOW (ref 12.0–15.0)
MCHC: 33.4 g/dL (ref 30.0–36.0)
RDW: 13.3 % (ref 11.5–15.5)
WBC: 6.1 10*3/uL (ref 4.0–10.5)

## 2012-04-04 LAB — GLUCOSE, CAPILLARY: Glucose-Capillary: 93 mg/dL (ref 70–99)

## 2012-04-04 LAB — BASIC METABOLIC PANEL
Chloride: 105 mEq/L (ref 96–112)
GFR calc Af Amer: >90 mL/min (ref >90)
GFR calc non Af Amer: >90 mL/min (ref >90)
Glucose, Bld: 99 mg/dL (ref 70–99)
Potassium: 3.6 mEq/L (ref 3.5–5.1)
Sodium: 139 mEq/L (ref 135–145)

## 2012-04-04 LAB — LIPID PANEL: Cholesterol: 212 mg/dL — ABNORMAL HIGH (ref 0–200)

## 2012-04-04 LAB — HEMOGLOBIN A1C: Mean Plasma Glucose: 111 mg/dL (ref <117)

## 2012-04-04 NOTE — Progress Notes (Signed)
CARDIAC REHAB PHASE I   PRE:  Rate/Rhythm: 72 SR    BP: sitting 125/82    SaO2: 97 RA  MODE:  Ambulation: 700 ft   POST:  Rate/Rhythm: 90    BP: sitting 117/72     SaO2: 99 RA  Tolerated well, denied CP. Long walk. Ed completed and requests  Her name be [facility] to [facility]. Pt voices compliance to diet. Wants to quit last 1/2 cig a day. Gave resources. All family smokes in her house, encouraged to enforce family smoking outside. To walk independently today. [id number]  Harriet Masson CES, ACSM

## 2012-04-04 NOTE — H&P (Addendum)
HPI:  Patient arrived for cath from [facility].  She has been having recurrent SSCP, and presented again.  Here she continues to have pain without definite ECG changes.  She has had non invasive workup in the past, but continued symptoms, so the decision was made to transfer.  See Mr Serpe's consult note and [facility] internal medicine admission note.  The symptoms have been going on for several months, but more severe now.  Case discussed with patient and she wishes to proceed.    Current Facility-Administered Medications  Medication Dose Route Frequency Provider Last Rate Last Dose  . 0.9 %  sodium chloride infusion   Intravenous Continuous Herby Abraham, MD 125 mL/hr at 04/03/12 2235 125 mL at 04/03/12 2235  . 0.9 %  sodium chloride infusion  250 mL Intravenous PRN Prescott Parma, PA      . acetaminophen (TYLENOL) tablet 650 mg  650 mg Oral Q4H PRN Herby Abraham, MD   650 mg at 04/03/12 2018  . amitriptyline (ELAVIL) tablet 25 mg  25 mg Oral QHS Herby Abraham, MD      . amLODipine [facility]) tablet 2.5 mg  2.5 mg Oral Daily Herby Abraham, MD      . aspirin chewable tablet 81 mg  81 mg Oral Daily Herby Abraham, MD      . atorvastatin (LIPITOR) tablet 10 mg  10 mg Oral q1800 Prescott Parma, PA   10 mg at 04/03/12 2008  . carisoprodol (SOMA) tablet 350 mg  350 mg Oral QID PRN Herby Abraham, MD      . lisinopril (PRINIVIL,ZESTRIL) tablet 20 mg  20 mg Oral Daily Herby Abraham, MD   20 mg at 04/03/12 2009   And  . hydrochlorothiazide (MICROZIDE) capsule 12.5 mg  12.5 mg Oral Daily Herby Abraham, MD   12.5 mg at 04/03/12 2009  . insulin aspart (novoLOG) injection 0-15 Units  0-15 Units Subcutaneous TID WC Prescott Parma, PA      . insulin aspart (novoLOG) injection 0-5 Units  0-5 Units Subcutaneous QHS Prescott Parma, PA      . nitroGLYCERIN (NITROSTAT) SL tablet 0.4 mg  0.4 mg Sublingual Q5 Min x 3 PRN Prescott Parma, PA      . ondansetron [facility]) injection 4 mg  4 mg  Intravenous Q6H PRN Herby Abraham, MD      . pantoprazole (PROTONIX) EC tablet 40 mg  40 mg Oral QAC breakfast Herby Abraham, MD   40 mg at 04/04/12 0745  . sodium chloride 0.9 % injection 3 mL  3 mL Intravenous Q12H Prescott Parma, PA      . sodium chloride 0.9 % injection 3 mL  3 mL Intravenous PRN Prescott Parma, PA      . Ticagrelor (BRILINTA) tablet 90 mg  90 mg Oral BID Herby Abraham, MD   90 mg at 04/03/12 2240  . venlafaxine XR (EFFEXOR-XR) 24 hr capsule 37.5 mg  37.5 mg Oral q morning - 10a Prescott Parma, PA        Allergies  Allergen Reactions  . Penicillins Anaphylaxis    Patient states she carries an epi-pen    Past Medical History  Diagnosis Date  . 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 Seizures   . Hypertension   . Hypercholesterolemia   . GERD (gastroesophageal reflux disease)   . Suicide attempt     3 attempts  in remote past  . PUD (peptic ulcer disease)     Possible?    Past Surgical History  Procedure Date  . Abdominal hysterectomy     fibroids  . Bladder surgery   . Shoulder surgery     left  . Hand surgery     right  . Eye surgery     removed glass  . Left breast lumpectomy     benign  . Colonoscopy May 2010    Fleishman: normal rectum, internal hemorrhoids, , benign colonic polyp  . Esophagogastroduodenoscopy     2001 Dr. Madilyn Fireman: distal esophagitis, small hiatal hernia,. Dr. Katrinka Blazing 2006? no records available currently, pt also reports  EGD a few years ago with Dr. Jena Gauss, do not have these reports anywhere in medical records    Family History  Problem Relation Age of Onset  . Colon cancer Paternal Grandfather     History   Social History  . Marital Status: Single    Spouse Name: N/A    Number of Children: N/A  . Years of Education: N/A   Occupational History  . Not on file.   Social History Main Topics  . Smoking status: Current Every Day Smoker -- 0.2 packs/day    Types: Cigarettes  . Smokeless  tobacco: Not on file  . Alcohol Use: No  . Drug Use: No  . Sexually Active: Not on file   Other Topics Concern  . Not on file   Social History Narrative  . No narrative on file    ROS: Please see the HPI.  All other systems reviewed and negative.  She does say she had some hematuria a while back but non recently.    PHYSICAL EXAM:  BP 124/79  Pulse 56  Temp 97.9 F (36.6 C) (Oral)  Resp 18  Ht 5\' 4"  (1.626 m)  Wt 180 lb 12.4 oz (82 kg)  BMI 31.03 kg/m2  SpO2 99%  General: Well developed, well nourished, with mild ongoing chest pain.   Head:  Normocephalic and atraumatic. Neck: no JVD Lungs: Clear to auscultation and percussion. Heart: Normal S1 and S2.  No murmur, rubs or gallops.  Abdomen:  Normal bowel sounds; soft; non tender; no organomegaly Pulses: Pulses normal in all 4 extremities. Extremities: No clubbing or cyanosis. No edema. Neurologic: Alert and oriented x 3.  EKG:  See [facility] notes.   ASSESSMENT AND PLAN:  1.  Hypokalemia  -  Treated 2.  Possible unstable angina 3.  DM 4.  Tobacco abuse 5.  History of hematuria.  6.  Hypertension   Plan  1.  Transfer to floor. 2.  Ambulate  3.  Plan dc tomorrow if no more symptoms.

## 2012-04-04 NOTE — Care Management Note (Signed)
    Page 1 of 1   04/04/2012     2:30:23 PM   CARE MANAGEMENT NOTE 04/04/2012  Patient:  Bethany Wang, Bethany Wang   Account Number:  [id number]  Date Initiated:  04/04/2012  Documentation initiated by:  Junius Creamer  Subjective/Objective Assessment:   adm w ch pain     Action/Plan:   lives alone, pcp dr Sherryll Burger   Anticipated DC Date:  04/05/2012   Anticipated DC Plan:  HOME/SELF CARE      DC Planning Services  CM consult      Choice offered to / List presented to:             Status of service:   Medicare Important Message given?   (If response is "NO", the following Medicare IM given date fields will be blank) Date Medicare IM given:   Date Additional Medicare IM given:    Discharge Disposition:    Per UR Regulation:  Reviewed for med. necessity/level of care/duration of stay  If discussed at Long Length of Stay Meetings, dates discussed:    Comments:  12/24 1429p debbie Aikeem Lilley rn,bsn gave pt brilinta 30day free card and copay assist card.

## 2012-04-05 ENCOUNTER — Encounter (HOSPITAL_COMMUNITY): Payer: Self-pay | Admitting: Physician Assistant

## 2012-04-05 DIAGNOSIS — E119 Type 2 diabetes mellitus without complications: Secondary | ICD-10-CM | POA: Diagnosis present

## 2012-04-05 LAB — GLUCOSE, CAPILLARY: Glucose-Capillary: 93 mg/dL (ref 70–99)

## 2012-04-05 MED ORDER — PANTOPRAZOLE SODIUM 40 MG PO TBEC: 40.0000 mg | DELAYED_RELEASE_TABLET | Freq: Every day | ORAL | Status: DC

## 2012-04-05 MED ORDER — NITROGLYCERIN 0.4 MG SL SUBL: 0.4000 mg | SUBLINGUAL_TABLET | SUBLINGUAL | Status: DC | PRN

## 2012-04-05 MED ORDER — TICAGRELOR 90 MG PO TABS: 90.0000 mg | ORAL_TABLET | Freq: Two times a day (BID) | ORAL | Status: DC

## 2012-04-05 MED ORDER — ASPIRIN 81 MG PO CHEW: 81.0000 mg | CHEWABLE_TABLET | Freq: Every day | ORAL | Status: DC

## 2012-04-05 MED ORDER — ATORVASTATIN CALCIUM 10 MG PO TABS: 20.0000 mg | ORAL_TABLET | Freq: Every day | ORAL | Status: DC

## 2012-04-05 MED ORDER — POTASSIUM CHLORIDE ER 10 MEQ PO TBCR: 10.0000 meq | EXTENDED_RELEASE_TABLET | Freq: Two times a day (BID) | ORAL | Status: DC

## 2012-04-05 NOTE — Discharge Planning (Cosign Needed)
Physician Discharge Summary  Error listed under discharge planning. See note under proper heading

## 2012-04-05 NOTE — Progress Notes (Addendum)
Patient Name: Bethany Wang Date of Encounter: 04/05/2012       SUBJECTIVE  Patient up and around and doing well.  No current chest pain.  Ambulating without difficulty.  CURRENT MEDS    . amitriptyline  25 mg Oral QHS  . amLODipine  2.5 mg Oral Daily  . aspirin  81 mg Oral Daily  . atorvastatin  10 mg Oral q1800  . lisinopril  20 mg Oral Daily   And  . hydrochlorothiazide  12.5 mg Oral Daily  . insulin aspart  0-15 Units Subcutaneous TID WC  . insulin aspart  0-5 Units Subcutaneous QHS  . pantoprazole  40 mg Oral QAC breakfast  . Ticagrelor  90 mg Oral BID  . venlafaxine XR  37.5 mg Oral q morning - 10a    OBJECTIVE  Filed Vitals:   04/04/12 0740 04/04/12 1503 04/04/12 1934 04/05/12 0443  BP: 105/73 130/82 134/87 122/81  Pulse: 53 62 65 63  Temp:  98.2 F (36.8 C) 98.3 F (36.8 C) 98.4 F (36.9 C)  TempSrc:  Oral Oral Oral  Resp:  19 18 19   Height:      Weight:      SpO2: 98% 99% 99% 100%    Intake/Output Summary (Last 24 hours) at 04/05/12 0944 Last data filed at 04/05/12 0600  Gross per 24 hour  Intake   1080 ml  Output      0 ml  Net   1080 ml   Filed Weights   04/03/12 1800  Weight: 180 lb 12.4 oz (82 kg)    PHYSICAL EXAM  General: Pleasant, NAD. Neuro: Alert and oriented X 3. Moves all extremities spontaneously. Psych: Normal affect. HEENT:  Normal  Neck: Supple without bruits or JVD. Lungs:  Resp regular and unlabored, CTA. Heart: RRR no s3, s4, or murmurs. Abdomen: Soft, non-tender, non-distended, BS + x 4.  Extremities: No clubbing, cyanosis or edema. DP/PT/Radials 2+ and equal bilaterally.  Groin without hematoma.   Accessory Clinical Findings  CBC  Basename 04/04/12 0530  WBC 6.1  NEUTROABS --  HGB 11.0*  HCT 32.9*  MCV 82.3  PLT 252   Basic Metabolic Panel  Basename 04/04/12 0530  NA 139  K 3.6  CL 105  CO2 23  GLUCOSE 99  BUN 8  CREATININE 0.72  CALCIUM 8.9  MG --  PHOS --   Liver Function Tests No  results found for this basename: AST:2,ALT:2,ALKPHOS:2,BILITOT:2,PROT:2,ALBUMIN:2 in the last 72 hours No results found for this basename: LIPASE:2,AMYLASE:2 in the last 72 hours Cardiac Enzymes No results found for this basename: CKTOTAL:3,CKMB:3,CKMBINDEX:3,TROPONINI:3 in the last 72 hours BNP No components found with this basename: POCBNP:3 D-Dimer No results found for this basename: DDIMER:2 in the last 72 hours Hemoglobin A1C  Basename 04/03/12 1857  HGBA1C 5.5   Fasting Lipid Panel  Basename 04/04/12 0530  CHOL 212*  HDL 39*  LDLCALC 147*  TRIG 130  CHOLHDL 5.4  LDLDIRECT --   Thyroid Function Tests No results found for this basename: TSH,T4TOTAL,FREET3,T3FREE,THYROIDAB in the last 72 hours  TELE  No rhythm problems  ECG  No acute changes 12/24  Radiology/Studies  No results found.  ASSESSMENT AND PLAN  1.  CAD with unstable angina  ---  PCI of the RCA with excellent result--on DAPT 2.  Diabetes mellitus  ---  Diet controlled.  Was on pill, but with weight, the pills were stopped 3.  GE reflux  ---  Has been on prilosec.  With ticagrelor does not need to change, but told she would if using plavix later 4.  Residual CAD  ---  Has moderate LAD disease, and will need standard GXT to assess exercise tolerance, and ischemia with follow up in [place] office.   5.  Glaucoma  ---  Follow up at [facility] planned near future. 6.  Bradycardia  -- cannot use beta blockers for now.  7.  Hyperlipidemia  -- Lipitor should be increased to 20mg  per day.  8.  Hypokalemia  -- add KCL to regimen which includes a combo pill  (See home meds)---follow up labs next week in primary care.    Many of her meds were prn, particularly pulmonary meds.  Meds reviewed.  Side effects of Ticagrelor also reviewed.    Meds and stent details reviewed in detail.  She understands needs for DAPT for one year or less if clinical issues  (such as hematuria).  She will need close follow up in [facility] for  residual CAD.    More than thirty minutes with patient.    Signed, [name] MD, [facility], FSCAI

## 2012-04-05 NOTE — Progress Notes (Signed)
04/05/2012 12:02 PM Nursing note Discharge avs form, rx, medications already taken today and those due this evening given and explained to patient and husband. Incision care, follow up appointments and when to call MD discussed. NTG SL use reviewed with pt. And pt. Able to verbalized how to use correctly. Brilinta card and 30 day rx explained to patient as well. Questions and concerns addressed. D/c iv line. D/c tele. D/c home per orders.

## 2012-04-06 MED FILL — Dextrose Inj 5%: INTRAVENOUS | Qty: 50 | Status: AC

## 2012-04-10 ENCOUNTER — Other Ambulatory Visit: Payer: Self-pay | Admitting: *Deleted

## 2012-04-10 DIAGNOSIS — I10 Essential (primary) hypertension: Secondary | ICD-10-CM

## 2012-04-10 DIAGNOSIS — I2510 Atherosclerotic heart disease of native coronary artery without angina pectoris: Secondary | ICD-10-CM

## 2012-04-10 DIAGNOSIS — I200 Unstable angina: Secondary | ICD-10-CM

## 2012-04-20 ENCOUNTER — Encounter: Payer: Self-pay | Admitting: *Deleted

## 2012-04-20 ENCOUNTER — Ambulatory Visit (INDEPENDENT_AMBULATORY_CARE_PROVIDER_SITE_OTHER): Payer: Medicare Other | Admitting: Physician Assistant

## 2012-04-20 ENCOUNTER — Telehealth: Payer: Self-pay | Admitting: Physician Assistant

## 2012-04-20 ENCOUNTER — Other Ambulatory Visit: Payer: Self-pay | Admitting: *Deleted

## 2012-04-20 ENCOUNTER — Encounter: Payer: Self-pay | Admitting: Physician Assistant

## 2012-04-20 VITALS — BP 115/81 | HR 80 | Ht 64.0 in | Wt 177.0 lb

## 2012-04-20 DIAGNOSIS — E119 Type 2 diabetes mellitus without complications: Secondary | ICD-10-CM

## 2012-04-20 DIAGNOSIS — I2510 Atherosclerotic heart disease of native coronary artery without angina pectoris: Secondary | ICD-10-CM

## 2012-04-20 DIAGNOSIS — F17200 Nicotine dependence, unspecified, uncomplicated: Secondary | ICD-10-CM

## 2012-04-20 DIAGNOSIS — I25119 Atherosclerotic heart disease of native coronary artery with unspecified angina pectoris: Secondary | ICD-10-CM | POA: Insufficient documentation

## 2012-04-20 DIAGNOSIS — E876 Hypokalemia: Secondary | ICD-10-CM | POA: Insufficient documentation

## 2012-04-20 DIAGNOSIS — I10 Essential (primary) hypertension: Secondary | ICD-10-CM | POA: Insufficient documentation

## 2012-04-20 DIAGNOSIS — I1A0 Resistant hypertension: Secondary | ICD-10-CM | POA: Insufficient documentation

## 2012-04-20 DIAGNOSIS — Z720 Tobacco use: Secondary | ICD-10-CM

## 2012-04-20 DIAGNOSIS — E785 Hyperlipidemia, unspecified: Secondary | ICD-10-CM | POA: Insufficient documentation

## 2012-04-20 NOTE — Assessment & Plan Note (Signed)
Patient has since stopped smoking 

## 2012-04-20 NOTE — Assessment & Plan Note (Signed)
Resolved by recent followup labs

## 2012-04-20 NOTE — Assessment & Plan Note (Signed)
Followed by primary M.D. 

## 2012-04-20 NOTE — Patient Instructions (Addendum)
Continue all current medications. Exercise Cardiolite (stress test) Office will contact with results Follow up in  2 weeks

## 2012-04-20 NOTE — Assessment & Plan Note (Signed)
Will proceed with recommendation for a followup exercise stress test, to assess residual LAD ischemia. We'll otherwise continue current medication regimen and have patient return in 2 weeks for followup with me, for review of test results and further recommendations.

## 2012-04-20 NOTE — Assessment & Plan Note (Signed)
Recent LDL 147, on current dose Lipitor. Will discuss with patient at followup OV, plan on increasing dose to 80 daily. Aggressive management recommended with target LDL 70 or less, if feasible.

## 2012-04-20 NOTE — Progress Notes (Signed)
Primary Cardiologist: Simona Huh, MD (new)   HPI: Post hospital followup from [facility], status post recent diagnostic coronary angiography and PCI.  Patient referred to [place] for evaluation of UAP, in the context of multiple CRFs, but no previously documented CAD. Serial cardiac markers NL.   - Cardiac catheterization, December 23: Status post DES 95% tandem mid RCA stenoses; residual 60-70% mid LAD; EF 55-65%  Dr. Riley Kill recommended followup routine stress test to assess residual LAD ischemia.  Clinically, she denies any exertional CP with routine activities, but it has not really tested herself. She previously walked 1/2 miles a day, including uphill. Of note, she is in the process of enrolling in cardiac rehabilitation.  Since her recent hospitalization, she cites 2 episodes of CP, both occurring at rest. She apparently was in the midst of a heated discussions with her fianc, and had to take 1 NTG tablet in each case, with reported prompt relief.  She denies complications of the R groin incision site.  Allergies  Allergen Reactions  . Penicillins Anaphylaxis    Patient states she carries an epi-pen    Current Outpatient Prescriptions  Medication Sig Dispense Refill  . amitriptyline (ELAVIL) 25 MG tablet Take 25 mg by mouth at bedtime.      [name] amLODipine (NORVASC) 2.5 MG tablet Take 5 mg by mouth daily.       [name] aspirin 81 MG chewable tablet Chew 1 tablet (81 mg total) by mouth daily.      [name] atorvastatin (LIPITOR) 10 MG tablet Take 2 tablets (20 mg total) by mouth daily at 6 PM.  30 tablet  12  . benzonatate (TESSALON) 100 MG capsule Take 100 mg by mouth 3 (three) times daily as needed.      . Glycerin (OASIS MOISTURIZING MOUTH SPRAY MT) Use as directed 2 sprays in the mouth or throat as needed.      [name] lisinopril-hydrochlorothiazide (PRINZIDE,ZESTORETIC) 20-12.5 MG per tablet Take 1 tablet by mouth daily.      [name] lubiprostone (AMITIZA) 24 MCG capsule Take 24 mcg by mouth 2 (two) times  daily with a meal.      . nitroGLYCERIN (NITROSTAT) 0.4 MG SL tablet Place 1 tablet (0.4 mg total) under the tongue every 5 (five) minutes x 3 doses as needed for chest pain.  100 tablet  6  . pantoprazole (PROTONIX) 40 MG tablet Take 1 tablet (40 mg total) by mouth daily before breakfast.  30 tablet  6  . potassium chloride (K-DUR) 10 MEQ tablet Take 1 tablet (10 mEq total) by mouth 2 (two) times daily.  30 tablet  6  . Ticagrelor (BRILINTA) 90 MG TABS tablet Take 1 tablet (90 mg total) by mouth 2 (two) times daily.  60 tablet  10  . venlafaxine (EFFEXOR) 37.5 MG tablet Take 37.5 mg by mouth 2 (two) times daily.        Past Medical History  Diagnosis Date  . 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 Seizures   . Hypertension   . HLD (hyperlipidemia)   . GERD (gastroesophageal reflux disease)   . Suicide attempt     3 attempts in remote past  . PUD (peptic ulcer disease)     Possible?  . CAD (coronary artery disease)     a. [place]: s/p DES to RCA 04/03/12, residual mod LAD dz planned for GXT.  [name] Type 2 diabetes mellitus     Diet controlled  . Hematuria  H/o  . Bradycardia     Not on BB due to this  . Tobacco abuse   . Hypokalemia     Past Surgical History  Procedure Date  . Abdominal hysterectomy     fibroids  . Bladder surgery   . Shoulder surgery     left  . Hand surgery     right  . Eye surgery     removed glass  . Left breast lumpectomy     benign  . Colonoscopy May 2010    Fleishman: normal rectum, internal hemorrhoids, , benign colonic polyp  . Esophagogastroduodenoscopy     2001 Dr. Madilyn Fireman: distal esophagitis, small hiatal hernia,. Dr. Katrinka Blazing 2006? no records available currently, pt also reports  EGD a few years ago with Dr. Jena Gauss, do not have these reports anywhere in medical records    History   Social History  . Marital Status: Single    Spouse Name: N/A    Number of Children: N/A  . Years of Education: N/A   Occupational  History  . Not on file.   Social History Main Topics  . Smoking status: Former Smoker -- 0.2 packs/day for 32 years    Types: Cigarettes    Quit date: 04/03/2012  . Smokeless tobacco: Not on file  . Alcohol Use: No  . Drug Use: No  . Sexually Active: Not on file   Other Topics Concern  . Not on file   Social History Narrative  . No narrative on file    Family History  Problem Relation Age of Onset  . Colon cancer Paternal Grandfather     ROS: no nausea, vomiting; no fever, chills; no melena, hematochezia; no claudication  PHYSICAL EXAM: BP 115/81  Pulse 80  Ht 5\' 4"  (1.626 m)  Wt 177 lb (80.287 kg)  BMI 30.38 kg/m2 GENERAL: 47 year old female, moderately obese; NAD HEENT: NCAT, PERRLA, EOMI; sclera clear; no xanthelasma NECK: no JVD; no TM LUNGS: CTA bilaterally CARDIAC: RRR (S1, S2); no significant murmurs; no rubs or gallops ABDOMEN: Protuberant EXTREMETIES: no significant peripheral edema SKIN: warm/dry; no obvious rash/lesions MUSCULOSKELETAL: no joint deformity NEURO: no focal deficit; NL affect   EKG:    ASSESSMENT & PLAN:  CAD (coronary artery disease), native coronary artery Will proceed with recommendation for a followup exercise stress test, to assess residual LAD ischemia. We'll otherwise continue current medication regimen and have patient return in 2 weeks for followup with me, for review of test results and further recommendations.  Tobacco abuse  Patient has since stopped smoking  HLD (hyperlipidemia) Recent LDL 147, on current dose Lipitor. Will discuss with patient at followup OV, plan on increasing dose to 80 daily. Aggressive management recommended with target LDL 70 or less, if feasible.  Hypertension Well-controlled on current medication regimen  CAD (coronary artery disease) Will proceed with recommendation for a followup exercise stress test, to assess residual LAD ischemia. We'll otherwise continue current medication regimen and  have patient return in 2 weeks for followup with me, for review of test results and further recommendations.   Hypokalemia Resolved by recent followup labs  Diabetes Followed by primary M.D.    Gene Hasaan Radde, PAC

## 2012-04-20 NOTE — Assessment & Plan Note (Signed)
Well-controlled on current medication regimen 

## 2012-04-20 NOTE — Assessment & Plan Note (Signed)
Will proceed with recommendation for a followup exercise stress test, to assess residual LAD ischemia. We'll otherwise continue current medication regimen and have patient return in 2 weeks for followup with me, for review of test results and further recommendations.  

## 2012-04-20 NOTE — Telephone Encounter (Signed)
Exercise Cardiolite (stress test) Weight 177 Diagnosis: 414.00, 305.1, 414.01   Tuesday, January 14th, 2014 [facility] Checking percert

## 2012-04-24 NOTE — Telephone Encounter (Signed)
WUJW#[id number] EXP 06-08-12

## 2012-04-25 DIAGNOSIS — I2510 Atherosclerotic heart disease of native coronary artery without angina pectoris: Secondary | ICD-10-CM

## 2012-04-25 NOTE — Discharge Summary (Signed)
Patient ID: Bethany Wang MRN: [id number] DOB/AGE: 1966-03-01 47 y.o.   Admit date: 04/03/2012 Discharge date: 04/05/2012   Primary Discharge Diagnosis:   1. CAD s/p PCI of RCA   Secondary Discharge Diagnosis 1. Diabetes 2. GERD 3. Residual CAD -Moderate LAD disease 4. Bradycardia: No BB. 5. Hyperlipidemia 6. Hypokalemia 7. Glaucoma 8. Bipolar Disorder 9. Seizure disorder     Significant Diagnostic Studies:   1. Cardiac Cath 04/04/2012 Coronary dominance: right   Left mainstem: No significant obstruction   Left anterior descending (LAD): 60-70% eccentric lesion in the mid vessel at takeoff of small moderate diagonal branch. Vessel is heavily calcified   Left circumflex (LCx): no critical obstruction. Mild diffuse luminal irregularity   Right coronary artery (RCA): Tandem 95% mid vessel lesions prior to PCA with hypodensity. Distal vessel large with PDA, and PLA system. There is mild scattered plaque that is non obstructive throughout the distal vessel. Following PCI, 0% residual with TIMI 2 flow.   Left ventriculography: Left ventricular systolic function is normal, LVEF is estimated at 55-65%, there is no significant mitral regurgitation      2. PCI 04/04/2012 Vessel - RCA/Segment - 2   Percent Stenosis (pre) 95 times 2   TIMI-flow 3   Stent Promus Premier 2.5 by 38mm   Percent Stenosis (post) 0%   TIMI-flow (post) 3   Final Conclusions:   1. CAD with severe RCA disease and moderate LAD disease   2. Preserved LV function     Consults: None   Hospital Course:      [name] is a 47 year old patient normally followed in the [facility] office, with primary care physician Dr.Vyas, who was initially admitted to [facility] on 04/02/2012 with significant chest discomfort lasting a couple hours. Cardiology was consulted the following day secondary to ongoing chest pain, although cardiac enzymes were found to be negative. She is down to be mildly  hypokalemic with potassium of 3.5. Secondary to strong family history, cardiovascular risk factors to include family history and ongoing tobacco abuse, and symptoms of unstable angina, the patient was recommended to be transferred to [facility] hospital where cardiac catheterization was planned. H& P from [facility] was reviewed, but cardiac consultation was not available on arrival to [facility].    Cardiac catheterization was completed by Dr. [name] on 04/04/2012. This revealed nonobstructive CAD and the LAD territory, but severe RCA disease. The patient had subsequent PCI using a drug-eluting stent to the right coronary artery. She was placed on DAPT with Ticagrelor and ASA. She was started on statin, atorvastatin initially at 10 mg daily but increase to 20 mg daily and discharge. She was not placed on a beta blocker secondary to bradycardia; The patient was started on lisinopril 20 mg daily along with HCTZ as she had been taking prior to admission and a combo pill form. She was placed on potassium replacement due to hypokalemia on admission and use of HCTZ medication which will be continued as OP. Follow up BMET in one week.       The patient was seen and examined by Dr. Riley Kill on day of discharge and found to be stable. There was no evidence of bleeding at the site of her at her insertion site. She is up walking around day of discharge without any recurrent discomfort. He is recommended by Dr. Arminda Resides secondary to residual LAD disease found to be moderate, that she have a GXT in the office on followup after being seen by PA to  evaluate for ischemia in this territory. She is provided with prescriptions for all above medications. She is to followup with Dr. Sherril Croon as her primary care physician for ongoing medical management. She is advised on post cardiac catheterization care and precautions to include bleeding, hematoma, or pain at catheter insertion site.  She is educated on smoking cessation.    Discharge  Exam: Blood pressure 134/92, pulse 63, temperature 98.4 F (36.9 C), temperature source Oral, resp. rate 19, height 5\' 4"  (1.626 m), weight 180 lb 12.4 oz (82 kg), SpO2 100.00%.    Labs:    Lab Results   Component  Value  Date     WBC  6.1  04/04/2012     HGB  11.0*  04/04/2012     HCT  32.9*  04/04/2012     MCV  82.3  04/04/2012     PLT  252  04/04/2012    Lab  04/04/12 0530   NA  139   K  3.6   CL  105   CO2  23   BUN  8   CREATININE  0.72   CALCIUM  8.9   PROT  --   BILITOT  --   ALKPHOS  --   ALT  --   AST  --   GLUCOSE  99       Lab Results   Component  Value  Date     CHOL  212*  04/04/2012    Lab Results   Component  Value  Date     HDL  39*  04/04/2012    Lab Results   Component  Value  Date     LDLCALC  147*  04/04/2012    Lab Results   Component  Value  Date     TRIG  130  04/04/2012    Lab Results   Component  Value  Date     CHOLHDL  5.4  04/04/2012    No results found for this basename: LDLDIRECT          AVW:[id number] bradycardia Otherwise normal ECG     FOLLOW UP PLANS AND APPOINTMENTS       Discharge Orders      Future Orders  Please Complete By  Expires     Amb Referral to Cardiac Rehabilitation          Comments:     Pt agrees to Oupt. CRP in [place], will send referral.     Diet - low sodium heart healthy          Increase activity slowly          Discharge instructions          Comments:     Call for bleeding or excessive bruising in area of cardiac catheterizatin          Medication List        As of 04/05/2012 11:19 AM      STOP taking these medications              fexofenadine 180 MG tablet     Commonly known as: ALLEGRA           omeprazole 20 MG capsule     Commonly known as: PRILOSEC     Replaced by: pantoprazole 40 MG tablet          TAKE these medications              amitriptyline 25 MG tablet  Commonly known as: ELAVIL     Take 25 mg by mouth at bedtime.           amLODipine 2.5 MG tablet      Commonly known as: NORVASC     Take 2.5 mg by mouth daily.           aspirin 81 MG chewable tablet     Chew 1 tablet (81 mg total) by mouth daily.           atorvastatin 10 MG tablet     Commonly known as: LIPITOR     Take 2 tablets (20 mg total) by mouth daily at 6 PM.           budesonide-formoterol 160-4.5 MCG/ACT inhaler     Commonly known as: SYMBICORT     Inhale 2 puffs into the lungs 2 (two) times daily.           carisoprodol 350 MG tablet     Commonly known as: SOMA     Take 350 mg by mouth daily. For pain           cetirizine 10 MG tablet     Commonly known as: ZYRTEC     Take 10 mg by mouth daily.           fluticasone 50 MCG/ACT nasal spray     Commonly known as: FLONASE     Place 2 sprays into the nose daily.           hydrocortisone 25 MG suppository     Commonly known as: ANUSOL-HC     Place 25 mg rectally 2 (two) times daily.           ibuprofen 200 MG tablet     Commonly known as: ADVIL,MOTRIN     Take 200 mg by mouth every 6 (six) hours as needed.           lisinopril-hydrochlorothiazide 20-12.5 MG per tablet     Commonly known as: PRINZIDE,ZESTORETIC     Take 1 tablet by mouth daily.           lubiprostone 24 MCG capsule     Commonly known as: AMITIZA     Take 24 mcg by mouth 2 (two) times daily with a meal.           nitroGLYCERIN 0.4 MG SL tablet     Commonly known as: NITROSTAT     Place 1 tablet (0.4 mg total) under the tongue every 5 (five) minutes x 3 doses as needed for chest pain.           pantoprazole 40 MG tablet     Commonly known as: PROTONIX     Take 1 tablet (40 mg total) by mouth daily before breakfast.           potassium chloride 10 MEQ tablet     Commonly known as: K-DUR     Take 1 tablet (10 mEq total) by mouth 2 (two) times daily.           venlafaxine 37.5 MG tablet     Commonly known as: EFFEXOR     Take 37.5 mg by mouth 2 (two) times daily.          ASK your doctor about these medications               sucralfate 1 GM/10ML suspension     Commonly known as: CARAFATE     Take 10 mLs (1  g total) by mouth 4 (four) times daily.             Follow-up Information      Follow up with SERPE, EUGENE, PA. (Office will call you for appointment)      Contact information:     [address], [address]  [place] [address] [phone]                   Time spent with patient to include physician time:40 minutes Signed: Joni Reining 04/05/2012, 11:19 AM Co-Sign MD     Revision History...     Date/Time User Action   04/05/2012 11:28 AM Jodelle Gross, NP Sign   04/05/2012 11:23 AM Jodelle Gross, NP Sign  View Details Report

## 2012-04-26 NOTE — Discharge Summary (Signed)
Follow up will be in [place].

## 2012-05-01 ENCOUNTER — Telehealth: Payer: Self-pay | Admitting: *Deleted

## 2012-05-01 NOTE — Telephone Encounter (Signed)
Notes Recorded by Lesle Chris, LPN on 6/96/2952 at 11:50 AM Patient notified. ------  Notes Recorded by Lesle Chris, LPN on 8/41/3244 at 12:01 PM Left message to return call.

## 2012-05-01 NOTE — Telephone Encounter (Signed)
Message copied by Lesle Chris on Mon May 01, 2012 11:50 AM ------      Message from: Prescott Parma C      Created: Wed Apr 26, 2012  5:31 PM       NL GXT Cardiolite. No further w/u. Followup as scheduled.

## 2012-05-05 ENCOUNTER — Encounter: Payer: Self-pay | Admitting: Physician Assistant

## 2012-05-05 ENCOUNTER — Ambulatory Visit (INDEPENDENT_AMBULATORY_CARE_PROVIDER_SITE_OTHER): Payer: PRIVATE HEALTH INSURANCE | Admitting: Physician Assistant

## 2012-05-05 VITALS — BP 120/79 | HR 91 | Ht 64.5 in | Wt 182.0 lb

## 2012-05-05 DIAGNOSIS — I10 Essential (primary) hypertension: Secondary | ICD-10-CM

## 2012-05-05 DIAGNOSIS — E785 Hyperlipidemia, unspecified: Secondary | ICD-10-CM

## 2012-05-05 DIAGNOSIS — I2510 Atherosclerotic heart disease of native coronary artery without angina pectoris: Secondary | ICD-10-CM

## 2012-05-05 DIAGNOSIS — I200 Unstable angina: Secondary | ICD-10-CM

## 2012-05-05 MED ORDER — ATORVASTATIN CALCIUM 80 MG PO TABS: 80.0000 mg | ORAL_TABLET | Freq: Every evening | ORAL | Status: DC

## 2012-05-05 NOTE — Progress Notes (Signed)
Primary Cardiologist: Simona Huh, MD   HPI: Scheduled two-week followup for review of a GXT Cardiolite, which I ordered at time of last OV, for assessment of residual LAD ischemia, noted during recent coronary angiography and PCI.   - Exercise stress Cardiolite, January 14: Normal perfusion; EF 65%  Patient reports no further CP, since last OV. She remains compliant with her medications, and is ambulating freely with no associated symptoms.  Allergies  Allergen Reactions  . Penicillins Anaphylaxis    Patient states she carries an epi-pen    Current Outpatient Prescriptions  Medication Sig Dispense Refill  . amitriptyline (ELAVIL) 25 MG tablet Take 50 mg by mouth at bedtime.       [name] amLODipine (NORVASC) 2.5 MG tablet Take 5 mg by mouth daily.       [name] aspirin 81 MG chewable tablet Chew 1 tablet (81 mg total) by mouth daily.      [name] atorvastatin (LIPITOR) 10 MG tablet Take 2 tablets (20 mg total) by mouth daily at 6 PM.  30 tablet  12  . benzonatate (TESSALON) 100 MG capsule Take 100 mg by mouth 3 (three) times daily as needed.      . Glycerin (OASIS MOISTURIZING MOUTH SPRAY MT) Use as directed 2 sprays in the mouth or throat as needed.      [name] lisinopril-hydrochlorothiazide (PRINZIDE,ZESTORETIC) 20-12.5 MG per tablet Take 1 tablet by mouth daily.      [name] lubiprostone (AMITIZA) 24 MCG capsule Take 24 mcg by mouth 2 (two) times daily with a meal.      . nitroGLYCERIN (NITROSTAT) 0.4 MG SL tablet Place 1 tablet (0.4 mg total) under the tongue every 5 (five) minutes x 3 doses as needed for chest pain.  100 tablet  6  . pantoprazole (PROTONIX) 40 MG tablet Take 1 tablet (40 mg total) by mouth daily before breakfast.  30 tablet  6  . potassium chloride (K-DUR) 10 MEQ tablet Take 1 tablet (10 mEq total) by mouth 2 (two) times daily.  30 tablet  6  . Ticagrelor (BRILINTA) 90 MG TABS tablet Take 1 tablet (90 mg total) by mouth 2 (two) times daily.  60 tablet  10  . venlafaxine (EFFEXOR) 37.5 MG  tablet Take 37.5 mg by mouth 2 (two) times daily.        Past Medical History  Diagnosis Date  . 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 Seizures   . Hypertension   . HLD (hyperlipidemia)   . GERD (gastroesophageal reflux disease)   . Suicide attempt     3 attempts in remote past  . PUD (peptic ulcer disease)     Possible?  . CAD (coronary artery disease)     a. [place]: s/p DES to RCA 04/03/12, residual mod LAD dz planned for GXT.  [name] Type 2 diabetes mellitus     Diet controlled  . Hematuria     H/o  . Bradycardia     Not on BB due to this  . Tobacco abuse   . Hypokalemia     Past Surgical History  Procedure Date  . Abdominal hysterectomy     fibroids  . Bladder surgery   . Shoulder surgery     left  . Hand surgery     right  . Eye surgery     removed glass  . Left breast lumpectomy     benign  . Colonoscopy May 2010  [facility]: normal rectum, internal hemorrhoids, , benign colonic polyp  . Esophagogastroduodenoscopy     2001 Dr. Madilyn Fireman: distal esophagitis, small hiatal hernia,. Dr. Katrinka Blazing 2006? no records available currently, pt also reports  EGD a few years ago with Dr. Jena Gauss, do not have these reports anywhere in medical records    History   Social History  . Marital Status: Single    Spouse Name: N/A    Number of Children: N/A  . Years of Education: N/A   Occupational History  . Not on file.   Social History Main Topics  . Smoking status: Former Smoker -- 0.2 packs/day for 32 years    Types: Cigarettes    Quit date: 04/03/2012  . Smokeless tobacco: Not on file  . Alcohol Use: No  . Drug Use: No  . Sexually Active: Not on file   Other Topics Concern  . Not on file   Social History Narrative  . No narrative on file    Family History  Problem Relation Age of Onset  . Colon cancer Paternal Grandfather     ROS: no nausea, vomiting; no fever, chills; no melena, hematochezia; no claudication  PHYSICAL EXAM: BP  120/79  Pulse 91  Ht 5' 4.5" (1.638 m)  Wt 182 lb (82.555 kg)  BMI 30.76 kg/m2  SpO2 99% GENERAL: 47 year old female, moderately obese; NAD  HEENT: NCAT, PERRLA, EOMI; sclera clear; no xanthelasma  NECK: no JVD; no TM  LUNGS: CTA bilaterally  CARDIAC: RRR (S1, S2); no significant murmurs; no rubs or gallops  ABDOMEN: Protuberant  EXTREMETIES: no significant peripheral edema  SKIN: warm/dry; no obvious rash/lesions  MUSCULOSKELETAL: no joint deformity  NEURO: no focal deficit; NL affect    EKG:    ASSESSMENT & PLAN:  CAD (coronary artery disease) Continue current medication regimen, including DAPT with ASA/Brilinta for at least 1 year. Reassess clinical status in 6 months, with Dr. Diona Browner.  Hypertension Well-controlled on current regimen  HLD (hyperlipidemia) Will increase Lipitor to full dose, following most recent LDL of 147. Reassess lipid status in 12 weeks. Target LDL 70 or less, if feasible.    Gene Jonasia Coiner, PAC

## 2012-05-05 NOTE — Assessment & Plan Note (Signed)
Well-controlled on current regimen. ?

## 2012-05-05 NOTE — Assessment & Plan Note (Signed)
Continue current medication regimen, including DAPT with ASA/Brilinta for at least 1 year. Reassess clinical status in 6 months, with Dr. Diona Browner.

## 2012-05-05 NOTE — Assessment & Plan Note (Signed)
Will increase Lipitor to full dose, following most recent LDL of 147. Reassess lipid status in 12 weeks. Target LDL 70 or less, if feasible.

## 2012-05-05 NOTE — Patient Instructions (Addendum)
   Increase Lipitor to 80mg  every evening  Continue Brilinta 90mg  twice a day  (you already have refills at pharm)  Continue all other current medications.  Labs due in 12 weeks (around August 04, 2012) for fasting lipid and liver panel - order given today Office will contact with results Your physician wants you to follow up in: 6 months.  You will receive a reminder letter in the mail one-two months in advance.  If you don't receive a letter, please call our office to schedule the follow up appointment

## 2012-08-30 DIAGNOSIS — R079 Chest pain, unspecified: Secondary | ICD-10-CM

## 2012-08-31 DIAGNOSIS — R079 Chest pain, unspecified: Secondary | ICD-10-CM

## 2012-10-16 ENCOUNTER — Ambulatory Visit (INDEPENDENT_AMBULATORY_CARE_PROVIDER_SITE_OTHER): Payer: PRIVATE HEALTH INSURANCE | Admitting: Cardiology

## 2012-10-16 ENCOUNTER — Encounter: Payer: Self-pay | Admitting: Cardiology

## 2012-10-16 VITALS — BP 114/81 | HR 66 | Ht 64.5 in | Wt 194.8 lb

## 2012-10-16 DIAGNOSIS — I2510 Atherosclerotic heart disease of native coronary artery without angina pectoris: Secondary | ICD-10-CM

## 2012-10-16 DIAGNOSIS — I10 Essential (primary) hypertension: Secondary | ICD-10-CM

## 2012-10-16 DIAGNOSIS — R600 Localized edema: Secondary | ICD-10-CM

## 2012-10-16 DIAGNOSIS — E785 Hyperlipidemia, unspecified: Secondary | ICD-10-CM

## 2012-10-16 DIAGNOSIS — R609 Edema, unspecified: Secondary | ICD-10-CM

## 2012-10-16 NOTE — Patient Instructions (Signed)
Your physician recommends that you continue on your current medications as directed. Please refer to the Current Medication list given to you today.  Your physician wants you to follow-up in: 6 months. You will receive a reminder letter in the mail two months in advance. If you don't receive a letter, please call our office to schedule the follow-up appointment.  

## 2012-10-16 NOTE — Assessment & Plan Note (Signed)
Continues on Lipitor. 

## 2012-10-16 NOTE — Assessment & Plan Note (Signed)
Blood pressure is normal today. 

## 2012-10-16 NOTE — Assessment & Plan Note (Signed)
No evidence of DVT by recent Dopplers. Keep followup with Dr. Sherryll Burger.

## 2012-10-16 NOTE — Progress Notes (Signed)
.   Clinical Summary [name] is a 47 y.o.female presenting for office visit. She was last seen by Mr. Serpe in January of this year. This is my first meeting with her. Records reviewed including interval followup by Dr. Andee Lineman and transfer to [facility] with subsequent placement of DES to the LAD in February. More recently she was seen in consultation at [facility] by Dr. Antoine Poche in May with atypical chest pain. Cardiac markers were normal at that time. It was felt that symptoms were possibly GI in etiology.   Exercise Cardiolite from January of this year demonstrated normal perfusion with LVEF 65%.  She reports no angina symptoms, states that she has been walking up to 4 miles a day. She has had some left leg swelling. Dr. Sherryll Burger arranged for her to have lower extremity Dopplers done 7/2, showing no evidence of DVT.  Reviewed her cardiac medications, reinforced compliance with DAPT for one year.   Allergies  Allergen Reactions  . Penicillins Anaphylaxis    Patient states she carries an epi-pen    Current Outpatient Prescriptions  Medication Sig Dispense Refill  . amLODipine (NORVASC) 2.5 MG tablet Take 5 mg by mouth daily.       [name] aspirin 81 MG chewable tablet Chew 1 tablet (81 mg total) by mouth daily.      [name] atorvastatin (LIPITOR) 80 MG tablet Take 1 tablet (80 mg total) by mouth every evening.  30 tablet  6  . benzonatate (TESSALON) 100 MG capsule Take 100 mg by mouth 3 (three) times daily as needed.      . fesoterodine (TOVIAZ) 8 MG TB24 Take 8 mg by mouth daily.      . Glycerin (OASIS MOISTURIZING MOUTH SPRAY MT) Use as directed 2 sprays in the mouth or throat as needed.      [name] HYDROcodone-acetaminophen (NORCO/VICODIN) 5-325 MG per tablet Take 1 tablet by mouth every 8 (eight) hours as needed.       [name] lisinopril-hydrochlorothiazide (PRINZIDE,ZESTORETIC) 20-12.5 MG per tablet Take 1 tablet by mouth daily.      [name] lithium 300 MG tablet Take 300 mg by mouth 2 (two) times daily.       .  nitroGLYCERIN (NITROSTAT) 0.4 MG SL tablet Place 1 tablet (0.4 mg total) under the tongue every 5 (five) minutes x 3 doses as needed for chest pain.  100 tablet  6  . pantoprazole (PROTONIX) 40 MG tablet Take 1 tablet (40 mg total) by mouth daily before breakfast.  30 tablet  6  . predniSONE (DELTASONE) 5 MG tablet Take 5 mg by mouth as directed. Starts 10-16-2012      . QUEtiapine (SEROQUEL) 100 MG tablet Take 50-100 mg by mouth at bedtime.      . Ticagrelor (BRILINTA) 90 MG TABS tablet Take 1 tablet (90 mg total) by mouth 2 (two) times daily.  60 tablet  10  . triamcinolone lotion (KENALOG) 0.1 % Apply 1 application topically as needed.       . venlafaxine (EFFEXOR) 75 MG tablet Take 75 mg by mouth daily.       No current facility-administered medications for this visit.    Past Medical History  Diagnosis Date  . 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 Seizures   . Essential hypertension, benign   . Mixed hyperlipidemia   . GERD (gastroesophageal reflux disease)   . Suicide attempt     3 attempts in remote past  .  PUD (peptic ulcer disease)   . Coronary atherosclerosis of native coronary artery     DES to RCA 04/03/12, residual mod LAD  . Type 2 diabetes mellitus   . History of hematuria   . Bradycardia     Social History [name] reports that she quit smoking about 6 months ago. Her smoking use included Cigarettes. She has a 6.4 pack-year smoking history. She does not have any smokeless tobacco history on file. [name] reports that she does not drink alcohol.  Review of Systems No palpitations or syncope. No recent bouts of depression. Otherwise negative except as outlined.  Physical Examination Filed Vitals:   10/16/12 1000  BP: 114/81  Pulse: 66   Filed Weights   10/16/12 1000  Weight: 194 lb 12.8 oz (88.361 kg)   Comfortable at rest. HEENT: Conjunctiva and lids normal, oropharynx clear. Neck: Supple, no elevated JVP or carotid  bruits, no thyromegaly. Lungs: Clear to auscultation, nonlabored breathing at rest. Cardiac: Regular rate and rhythm, no S3 or significant systolic murmur, no pericardial rub. Abdomen: Soft, nontender, bowel sounds present. Extremities: Left foot edema, distal pulses 2+. Skin: Warm and dry. Musculoskeletal: No kyphosis. Neuropsychiatric: Alert and oriented x3, affect grossly appropriate.   Problem List and Plan   Coronary atherosclerosis of native coronary artery Symptomatically stable. Records reviewed including DES to the RCA in December of last year at [facility] followed by DES to the LAD at [facility] in February of this year. Reinforced compliance with DAPT. Followup arranged.  Essential hypertension, benign Blood pressure is normal today.  HLD (hyperlipidemia) Continues on Lipitor.  Edema of left foot No evidence of DVT by recent Dopplers. Keep followup with Dr. Sherryll Burger.    Jonelle Sidle, M.D., F.A.C.C.

## 2012-10-16 NOTE — Assessment & Plan Note (Signed)
Symptomatically stable. Records reviewed including DES to the RCA in December of last year at [facility] followed by DES to the LAD at [facility] in February of this year. Reinforced compliance with DAPT. Followup arranged.

## 2013-01-16 ENCOUNTER — Encounter: Payer: Self-pay | Admitting: Internal Medicine

## 2013-01-18 ENCOUNTER — Encounter (INDEPENDENT_AMBULATORY_CARE_PROVIDER_SITE_OTHER): Payer: Self-pay

## 2013-01-18 ENCOUNTER — Ambulatory Visit (INDEPENDENT_AMBULATORY_CARE_PROVIDER_SITE_OTHER): Payer: PRIVATE HEALTH INSURANCE | Admitting: Gastroenterology

## 2013-01-18 ENCOUNTER — Encounter: Payer: Self-pay | Admitting: Gastroenterology

## 2013-01-18 VITALS — BP 117/76 | HR 81 | Temp 97.6°F | Ht 64.0 in | Wt 194.8 lb

## 2013-01-18 DIAGNOSIS — K625 Hemorrhage of anus and rectum: Secondary | ICD-10-CM

## 2013-01-18 DIAGNOSIS — R110 Nausea: Secondary | ICD-10-CM | POA: Insufficient documentation

## 2013-01-18 DIAGNOSIS — R112 Nausea with vomiting, unspecified: Secondary | ICD-10-CM

## 2013-01-18 DIAGNOSIS — R109 Unspecified abdominal pain: Secondary | ICD-10-CM

## 2013-01-18 LAB — CBC WITH DIFFERENTIAL/PLATELET
Basophils Relative: 1 % (ref 0–1)
Eosinophils Absolute: 0.2 10*3/uL (ref 0.0–0.7)
Eosinophils Relative: 3 % (ref 0–5)
Lymphs Abs: 2.6 10*3/uL (ref 0.7–4.0)
MCH: 26 pg (ref 26.0–34.0)
MCHC: 32.3 g/dL (ref 30.0–36.0)
MCV: 80.5 fL (ref 78.0–100.0)
Neutrophils Relative %: 42 % — ABNORMAL LOW (ref 43–77)
Platelets: 321 10*3/uL (ref 150–400)
RBC: 4.57 MIL/uL (ref 3.87–5.11)
RDW: 15.9 % — ABNORMAL HIGH (ref 11.5–15.5)

## 2013-01-18 LAB — HEPATIC FUNCTION PANEL
Albumin: 3.8 g/dL (ref 3.5–5.2)
Alkaline Phosphatase: 112 U/L (ref 39–117)
Total Bilirubin: 0.3 mg/dL (ref 0.3–1.2)
Total Protein: 6.4 g/dL (ref 6.0–8.3)

## 2013-01-18 LAB — IRON: Iron: 35 ug/dL — ABNORMAL LOW (ref 42–145)

## 2013-01-18 LAB — LIPASE: Lipase: 29 U/L (ref 0–75)

## 2013-01-18 MED ORDER — ONDANSETRON 4 MG PO TBDP: 4.0000 mg | ORAL_TABLET | Freq: Three times a day (TID) | ORAL | Status: DC

## 2013-01-18 MED ORDER — PANTOPRAZOLE SODIUM 40 MG PO TBEC: 40.0000 mg | DELAYED_RELEASE_TABLET | Freq: Two times a day (BID) | ORAL | Status: DC

## 2013-01-18 NOTE — Progress Notes (Signed)
CC'd to PCP 

## 2013-01-18 NOTE — Assessment & Plan Note (Signed)
?  Internal hemorrhoids. Obtain recent CT scan. TCS in future.

## 2013-01-18 NOTE — Assessment & Plan Note (Signed)
H.pylori gastritis historically, failed treatment. Question this as the culprit; unable to exclude underlying diabetic gastroparesis.

## 2013-01-18 NOTE — Assessment & Plan Note (Signed)
47 year old female with history of H.pylori gastritis s/p failed treatment, now with multiple complaints to include constant lower abdominal pain, dyspepsia, nausea and vomiting with solid foods, and vague esophageal dysphagia. Reports melena recently. Reportedly has had a CT scan at an outside facility, which I am requesting. Concern for PUD, doubt acute abdominal process at this time as patient appears in no distress. Recommend EGD with dilation as appropriate in the near future, along with a colonoscopy due to recent significant rectal bleeding.   I am not as familiar with Brilinta, and she has had stents placed as recently as Dec and Feb of this year. Will need to discuss this with cardiology.   In the interim: increase Protonix to BID. Add Zofran TID. Obtain outside records. Check labs today, obtain outside CT.  Consider GES if EGD negative Will need treatment for H.pylori in near future once N/V improved.  TCS/EGD/ED after above reviewed: risks and benefits discussed at appt with stated understanding.

## 2013-01-18 NOTE — Patient Instructions (Addendum)
Start taking Protonix twice a day. I have sent this to your pharmacy. Take on an empty stomach.  I have given you a prescription for Zofran, which is a nausea medicine. Take this three times a day, or every 8 hours.   Please have blood work done today.   We will be talking with your cardiologist about what to do with your blood thinners. In the future, you will need a colonoscopy and upper endoscopy likely.   Further recommendations to follow.

## 2013-01-18 NOTE — Progress Notes (Signed)
Referring Provider: Kirstie Peri, MD Primary Care Physician:  Kirstie Peri, MD Primary GI: Dr. Jena Gauss    Chief Complaint  Patient presents with  . Abdominal Pain  . Emesis    HPI:   Bethany Wang is a 47 year old female who was last seen by [place] in Feb 2013 secondary to dysphagia. She underwent an EGD with dilation of probably cervical esophageal web. She was found to have H.pylori gastritis. Wasn't unable to complete H.pylori treatment. Only completed a few days worth. Phone notes reveal she called in several times with issues swallowing the pills; BPE performed April 2013 with a non-obstructing mucosal web at anterior wall of cervical esophagus without significant narrowing.   Referred again by Dr. Sherryll Burger secondary to abdominal pain. Will eat, then an hour or two later has to vomit. Soup and crackers stay down. States abdomen hurts lower abdomen, "all day and all night". States had rectal bleeding for about a week. Given abx. Rectal bleeding now resolved. Continued vague lower abdominal pain without precipitating or relieving factors. BM once a day. Only eats one meal a day. Meal doesn't stay down. Constant nausea. Drinks fluid in place of food. States she has seen melena, last seen about 3 days ago. Feels like food sits in mid esophagus for a long time, finally goes down, sits in epigastric area then starts burning and has to throw it up. Protonix once daily. Started taking Brilinta in Dec 2013; has had stent in Dec 2013 at [facility] and Feb 2014 at [facility].   Past Medical History  Diagnosis Date  . 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 Seizures   . Essential hypertension, benign   . Mixed hyperlipidemia   . GERD (gastroesophageal reflux disease)   . Suicide attempt     3 attempts in remote past  . PUD (peptic ulcer disease)   . Coronary atherosclerosis of native coronary artery     DES to RCA 04/03/12, residual mod LAD  . Type 2 diabetes mellitus   . History of  hematuria   . Bradycardia     Past Surgical History  Procedure Laterality Date  . Abdominal hysterectomy      fibroids  . Bladder surgery    . Shoulder surgery      left  . Hand surgery      right  . Eye surgery      removed glass  . Left breast lumpectomy      benign  . Colonoscopy  May 2010    Fleishman: normal rectum, internal hemorrhoids, , benign colonic polyp  . Esophagogastroduodenoscopy      2001 Dr. Madilyn Fireman: distal esophagitis, small hiatal hernia,. Dr. Katrinka Blazing 2006? no records available currently, pt also reports  EGD a few years ago with Dr. Jena Gauss, do not have these reports anywhere in medical records  . Esophagogastroduodenoscopy  06/02/2011    NWG:NFAOZH pill impaction as described above s/p dilation of a probable cervical esophageal web/bx abnormal esophageal and gastric mucosa. + H.pylori gastritis     Current Outpatient Prescriptions  Medication Sig Dispense Refill  . amLODipine (NORVASC) 2.5 MG tablet Take 5 mg by mouth daily.       [name] aspirin 81 MG chewable tablet Chew 1 tablet (81 mg total) by mouth daily.      [name] atorvastatin (LIPITOR) 80 MG tablet Take 1 tablet (80 mg total) by mouth every evening.  30 tablet  6  . benzonatate (TESSALON) 100 MG capsule Take  100 mg by mouth 3 (three) times daily as needed.      . fesoterodine (TOVIAZ) 8 MG TB24 Take 8 mg by mouth daily.      . Glycerin (OASIS MOISTURIZING MOUTH SPRAY MT) Use as directed 2 sprays in the mouth or throat as needed.      [name] HYDROcodone-acetaminophen (NORCO/VICODIN) 5-325 MG per tablet Take 1 tablet by mouth every 8 (eight) hours as needed.       [name] lisinopril-hydrochlorothiazide (PRINZIDE,ZESTORETIC) 20-12.5 MG per tablet Take 1 tablet by mouth daily.      [name] lithium 300 MG tablet Take 300 mg by mouth 2 (two) times daily.       . nitroGLYCERIN (NITROSTAT) 0.4 MG SL tablet Place 1 tablet (0.4 mg total) under the tongue every 5 (five) minutes x 3 doses as needed for chest pain.  100 tablet  6  . pantoprazole  (PROTONIX) 40 MG tablet Take 1 tablet (40 mg total) by mouth 2 (two) times daily.  60 tablet  6  . QUEtiapine (SEROQUEL) 100 MG tablet Take 50-100 mg by mouth at bedtime.      . Ticagrelor (BRILINTA) 90 MG TABS tablet Take 1 tablet (90 mg total) by mouth 2 (two) times daily.  60 tablet  10  . triamcinolone lotion (KENALOG) 0.1 % Apply 1 application topically as needed.       . venlafaxine (EFFEXOR) 75 MG tablet Take 75 mg by mouth daily.      . ondansetron (ZOFRAN ODT) 4 MG disintegrating tablet Take 1 tablet (4 mg total) by mouth 3 (three) times daily.  90 tablet  3   No current facility-administered medications for this visit.    Allergies as of 01/18/2013 - Review Complete 01/18/2013  Allergen Reaction Noted  . Penicillins Anaphylaxis 01/26/2011    Family History  Problem Relation Age of Onset  . Colon cancer Paternal Grandfather     History   Social History  . Marital Status: Single    Spouse Name: N/A    Number of Children: N/A  . Years of Education: N/A   Social History Main Topics  . Smoking status: Former Smoker -- 0.20 packs/day for 32 years    Types: Cigarettes    Quit date: 04/03/2012  . Smokeless tobacco: None  . Alcohol Use: No  . Drug Use: No  . Sexual Activity: None   Other Topics Concern  . None   Social History Narrative  . None    Review of Systems: As mentioned in HPI.   Physical Exam: BP 117/76  Pulse 81  Temp(Src) 97.6 F (36.4 C) (Oral)  Ht 5\' 4"  (1.626 m)  Wt 194 lb 12.8 oz (88.361 kg)  BMI 33.42 kg/m2 General:   Alert and oriented. No distress noted. Pleasant and cooperative.  Head:  Normocephalic and atraumatic. Eyes:  Conjuctiva clear without scleral icterus. Mouth:  Oral mucosa pink and moist. Good dentition. No lesions. Heart:  S1, S2 present without murmurs, rubs, or gallops. Regular rate and rhythm. Abdomen:  +BS, soft, TTP LLQ specifically with discomfort in lower abdomen diffusely and non-distended. No rebound or guarding. No  HSM or masses noted. Msk:  Symmetrical without gross deformities. Normal posture. Extremities:  Without edema. Neurologic:  Alert and  oriented x4;  grossly normal neurologically. Skin:  Intact without significant lesions or rashes. Cervical Nodes:  No significant cervical adenopathy. Psych:  Alert and cooperative. Normal mood and affect.

## 2013-01-22 ENCOUNTER — Telehealth: Payer: Self-pay | Admitting: Cardiology

## 2013-01-22 NOTE — Telephone Encounter (Signed)
Note from Dr. Jena Gauss office regarding patient needs a colonoscopy and upper endocscopy  Patient is on blood thinners. They need to know how long she needs to be off blood thinners Please call [phone] provider Nira Retort NP

## 2013-01-23 NOTE — Telephone Encounter (Signed)
Records reviewed. Patient underwent placement of DES to the LAD at [facility] in February of this year. In general, would not recommend discontinuing DAPT less than one year from DES placement, unless procedure needed is urgent. When the time is appropriate, Brilinta is typically held at least 5 days prior to planned procedure.

## 2013-01-24 ENCOUNTER — Ambulatory Visit (INDEPENDENT_AMBULATORY_CARE_PROVIDER_SITE_OTHER): Payer: PRIVATE HEALTH INSURANCE

## 2013-01-24 ENCOUNTER — Ambulatory Visit (INDEPENDENT_AMBULATORY_CARE_PROVIDER_SITE_OTHER): Payer: PRIVATE HEALTH INSURANCE | Admitting: Orthopedic Surgery

## 2013-01-24 VITALS — BP 118/51 | Ht 64.5 in | Wt 189.0 lb

## 2013-01-24 DIAGNOSIS — M25512 Pain in left shoulder: Secondary | ICD-10-CM | POA: Insufficient documentation

## 2013-01-24 DIAGNOSIS — G90519 Complex regional pain syndrome I of unspecified upper limb: Secondary | ICD-10-CM

## 2013-01-24 DIAGNOSIS — M5410 Radiculopathy, site unspecified: Secondary | ICD-10-CM | POA: Insufficient documentation

## 2013-01-24 DIAGNOSIS — IMO0002 Reserved for concepts with insufficient information to code with codable children: Secondary | ICD-10-CM

## 2013-01-24 DIAGNOSIS — M75100 Unspecified rotator cuff tear or rupture of unspecified shoulder, not specified as traumatic: Secondary | ICD-10-CM | POA: Insufficient documentation

## 2013-01-24 DIAGNOSIS — M25519 Pain in unspecified shoulder: Secondary | ICD-10-CM

## 2013-01-24 DIAGNOSIS — M75102 Unspecified rotator cuff tear or rupture of left shoulder, not specified as traumatic: Secondary | ICD-10-CM

## 2013-01-24 DIAGNOSIS — S43432A Superior glenoid labrum lesion of left shoulder, initial encounter: Secondary | ICD-10-CM

## 2013-01-24 DIAGNOSIS — S43439A Superior glenoid labrum lesion of unspecified shoulder, initial encounter: Secondary | ICD-10-CM | POA: Insufficient documentation

## 2013-01-24 DIAGNOSIS — M5020 Other cervical disc displacement, unspecified cervical region: Secondary | ICD-10-CM | POA: Insufficient documentation

## 2013-01-24 DIAGNOSIS — S43429A Sprain of unspecified rotator cuff capsule, initial encounter: Secondary | ICD-10-CM

## 2013-01-24 DIAGNOSIS — G90512 Complex regional pain syndrome I of left upper limb: Secondary | ICD-10-CM

## 2013-01-24 DIAGNOSIS — S43499A Other sprain of unspecified shoulder joint, initial encounter: Secondary | ICD-10-CM

## 2013-01-24 NOTE — Progress Notes (Signed)
Patient ID: Bethany Wang, female   DOB: Mar 02, 1966, 47 y.o.   MRN: [id number]  Chief Complaint  Patient presents with  . Shoulder Pain    Pain and swelling left shoulder. Referred by Dr. Sherryll Burger    HISTORY: 47 year old female had left shoulder arthroscopy and subacromial decompression approximately 10 years ago presents with 2 year history of left shoulder pain swelling burning numbness and pain in her left arm unrelieved by multiple cortisone injections. She also complains of central neck pain at the base of her cervical spine. She has had MRI of the shoulder in 2011 which showed that she had intact rotator cuff with tendinitis and bursitis, adequate decompression with small posterior labral tear.  The burning pain in her left upper extremity is   extremely concerning as is associated with numbness and tingling and swelling to suggest possible complex regional pain syndrome. She describes sharp dull throbbing stabbing burning sensation constant with 10 out of 10 pain despite Norco 5 mg and steroid shots every 2 months over the last year.  The past, family history and social history have been reviewed and are recorded in the corresponding sections of epic   Past Medical History  Diagnosis Date  . 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 Seizures   . Essential hypertension, benign   . Mixed hyperlipidemia   . GERD (gastroesophageal reflux disease)   . Suicide attempt     3 attempts in remote past  . PUD (peptic ulcer disease)   . Coronary atherosclerosis of native coronary artery     DES to RCA 04/03/12, residual mod LAD  . Type 2 diabetes mellitus   . History of hematuria   . Bradycardia    Past Surgical History  Procedure Laterality Date  . Abdominal hysterectomy      fibroids  . Bladder surgery    . Shoulder surgery      left  . Hand surgery      right  . Eye surgery      removed glass  . Left breast lumpectomy      benign  . Colonoscopy  May  2010    Fleishman: normal rectum, internal hemorrhoids, , benign colonic polyp  . Esophagogastroduodenoscopy      2001 Dr. Madilyn Fireman: distal esophagitis, small hiatal hernia,. Dr. Katrinka Blazing 2006? no records available currently, pt also reports  EGD a few years ago with Dr. Jena Gauss, do not have these reports anywhere in medical records  . Esophagogastroduodenoscopy  06/02/2011    WUJ:WJXBJY pill impaction as described above s/p dilation of a probable cervical esophageal web/bx abnormal esophageal and gastric mucosa. + H.pylori gastritis     Height 5' 4.5" (1.638 m), weight 189 lb (85.73 kg). The patient is well-developed well-nourished grooming and hygiene are normal  She is oriented x3 mood and affect are pleasant  Ambulation is without abnormality  Cervical spine She has tenderness at the base of the cervical spine and a positive Tinel's at C5-C6, with burning pain in the left trapezius muscle and supraspinatus fossa Muscle tension is normal scans intact  Left shoulder she does have some pain at 120 of forward elevation is normal internal/external rotation is normal passive abduction with no weakness of the rotator cuff no instability skin normal except for a bruise over the anterior arm muscle tone is normal  Right shoulder  Inspection and palpation revealed no abnormalities in the upper extremities.   Range of motion is full  without contracture.  Motor exam is normal with grade 5 strength.  The joints are fully reduced without subluxation.  There is no atrophy or tremor and muscle tone is normal.  All joints are stable.  Lower extremities no contracture subluxation atrophy tremor or skin changes   She will need an extensive workup which will need to include MRI of the cervical spine MRI of the shoulder Nerve conduction study of the upper extremities  Differential diagnosis includes complex regional pain syndrome Posterior labral tear Rotator cuff tear Cervical spondylosis with  herniated disc  Once the studies are completed we will have her come back for reevaluation;  in the interim  she will continue on her Norco 5 mg  Note she is on a blood thinner which is new to me it's called brilenta secondary coronary occlusion x2 last year she had 2 stents placed. She is therefore very complicated patient and not a good surgical candidate if a surgical lesion or disease is found. She may need chronic pain management.

## 2013-01-24 NOTE — Patient Instructions (Signed)
Referral to Dr. Gerilyn Pilgrim for nerve conduction study Mri ordered for c-spine and left shoulder

## 2013-01-24 NOTE — Telephone Encounter (Signed)
Information faxed to Dr. Luvenia Starch office.

## 2013-01-25 NOTE — Telephone Encounter (Signed)
Noted. Will monitor patient for now and plan on intervention in Feb 2015 unless symptoms worsen despite behavior modification and supportive measures.

## 2013-01-25 NOTE — Progress Notes (Signed)
I received labs from [facility] dated Aug 2014. Although the office Hgb was 8.4, the hospital labs appear to have Hgb 11.8, microcytic anemia. However, most recent blood work with Hgb 11.9, normocytic.   CT Aug 2014 at [facility] shows possible constipation, multiple hepatic hemangiomas and "perfusion anomalies".   Per Dr. Diona Browner, elective procedure should not be undertaken until at least Feb 2015 due to stent placement. Brilinta can be hold 5 days prior to the planned procedure. However, if she were to present with urgent issues, this could be reconsidered.   How is she doing on Zofran with meals and Protonix BID? Recommend following a dysphagia diet. I may consider adding Reglan if not contraindicated.   CT showed constipation. Let's trial Linzess 145 mcg daily. May offer voucher. I am sending this to her pharmacy. Has she had any further rectal bleeding?   She will need to wait until Feb 2015 for procedures unless she worsens. Let's recheck CBC in 4 weeks. Progress report next week.

## 2013-01-29 ENCOUNTER — Other Ambulatory Visit: Payer: Self-pay | Admitting: *Deleted

## 2013-01-29 ENCOUNTER — Telehealth: Payer: Self-pay | Admitting: *Deleted

## 2013-01-29 DIAGNOSIS — IMO0002 Reserved for concepts with insufficient information to code with codable children: Secondary | ICD-10-CM

## 2013-01-29 NOTE — Telephone Encounter (Addendum)
Patient has an appointment with Dr. Gerilyn Pilgrim 02/06/13 at 10:30 am for Nerve Conduction Study. Patient is aware of appointment date and time.

## 2013-01-31 NOTE — Progress Notes (Signed)
Quick Note:  Results: Hgb stable, HFP normal, lipase normal, iron mildly low at 35, ferritin low normal at 32.   Per Dr. Diona Browner, elective procedure should not be undertaken until at least Feb 2015 due to stent placement. Brilinta can be hold 5 days prior to the planned procedure. However, if she were to present with urgent issues, this could be reconsidered.  How is she doing on Zofran with meals and Protonix BID? Recommend following a dysphagia diet. I may consider adding Reglan if not contraindicated.    CT showed constipation. Let's trial Linzess 145 mcg daily. May offer voucher. I am sending this to her pharmacy. Has she had any further rectal bleeding?   She will need to wait until Feb 2015 for procedures unless she worsens. Let's recheck CBC in 4 weeks. Progress report next week after she has been on Linzess.   ______

## 2013-02-01 ENCOUNTER — Ambulatory Visit (INDEPENDENT_AMBULATORY_CARE_PROVIDER_SITE_OTHER): Payer: PRIVATE HEALTH INSURANCE | Admitting: Obstetrics and Gynecology

## 2013-02-01 ENCOUNTER — Encounter: Payer: Self-pay | Admitting: Obstetrics and Gynecology

## 2013-02-01 VITALS — BP 108/70 | Ht 65.5 in | Wt 189.0 lb

## 2013-02-01 DIAGNOSIS — N898 Other specified noninflammatory disorders of vagina: Secondary | ICD-10-CM

## 2013-02-01 DIAGNOSIS — Z0000 Encounter for general adult medical examination without abnormal findings: Secondary | ICD-10-CM

## 2013-02-01 DIAGNOSIS — Z01419 Encounter for gynecological examination (general) (routine) without abnormal findings: Secondary | ICD-10-CM

## 2013-02-01 DIAGNOSIS — IMO0002 Reserved for concepts with insufficient information to code with codable children: Secondary | ICD-10-CM | POA: Insufficient documentation

## 2013-02-01 DIAGNOSIS — Z1273 Encounter for screening for malignant neoplasm of ovary: Secondary | ICD-10-CM

## 2013-02-01 DIAGNOSIS — Z1212 Encounter for screening for malignant neoplasm of rectum: Secondary | ICD-10-CM

## 2013-02-01 LAB — POCT WET PREP (WET MOUNT): WBC, Wet Prep HPF POC: NEGATIVE

## 2013-02-01 LAB — HEMOCCULT GUIAC POC 1CARD (OFFICE)

## 2013-02-01 NOTE — Patient Instructions (Signed)
Please use the advice we discussed, make a trip to [place] and [place] with with partner, avoid forceful deep penetration to avoid pain of sex Mammograms manually Discuss with your oncologist whether you should be tested for BRCA1, given the young age you had your breast cancer

## 2013-02-01 NOTE — Progress Notes (Signed)
Patient ID: Bethany Wang, female   DOB: [birth year], 47 y.o.   MRN: [id number] Pt here today for her annual exam.  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 return annually or prn 3    Annual mammogram advised 2. Patient advised to discuss with her oncologist whether she is a candidate for BRCA1 testing 4. History of suicide attempt prior to 2002 on 3 occasions   Subjective:  Bethany Wang is a 47 y.o. female No obstetric history on file. who presents for annual exam. No LMP recorded. Patient has had a hysterectomy. The patient has complaints today of marked dyspareunia with her 47 year old fiance due to deep thrust dyspareunia. She is status post hysterectomy by Dr. Elpidio Anis general surgeon for what she describes as uterine cancer. Tubes and ovaries were removed. She's never been on hormone therapy since the hysterectomy. Review of records indicates that the surgery was for uterine fibroids and dysmenorrhea, with no NO indication of cancer or precancer. Date of surgery 05/26/2001. Operative note indicates that was an abdominal hysterectomy with ovaries and tubes preserved  The following portions of the patient's history were reviewed and updated as appropriate: allergies, current medications, past family history, past medical history, past social history, past surgical history and problem list.  Review of Systems Constitutional: negative, Positive for deep thrust dyspareunia due to pain with deep penetration from her well endowed partner, who expectsSEX. several times daily Gastrointestinal: negative Genitourinary: Negative  Objective:  BP 108/70  Ht 5' 5.5" (1.664 m)  Wt 189 lb (85.73 kg)  BMI 30.96 kg/m2   BMI: Body mass index is 30.96 kg/(m^2).  General Appearance: Alert, appropriate appearance for age. No acute distress HEENT: Grossly normal Neck / Thyroid:  Cardiovascular: RRR; normal S1, S2, no  murmur Lungs: CTA bilaterally Back: No CVAT Breast Exam: No masses or nodes.No dimpling, nipple retraction or discharge. Gastrointestinal: Soft, non-tender, no masses or organomegaly Pelvic Exam: External genitalia: normal general appearance Vaginal: normal mucosa without prolapse or lesions and Good irrigation estrogen effect felt to be present Cervix: absent Adnexa: normal bimanual exam and Good support excellent vaginal length greater than 6 inches Uterus: absent Rectovaginal: normal rectal, no masses Lymphatic Exam: Non-palpable nodes in neck, clavicular, axillary, or inguinal regions Skin: no rash or abnormalities Neurologic: Normal gait and speech, no tremor  Psychiatric: Alert and oriented, appropriate affect.  Urinalysis:Not done  Christin Bach. MD Pgr [phone] 12:25 PM

## 2013-02-01 NOTE — Addendum Note (Signed)
Addended by: Criss Alvine on: 02/01/2013 04:44 PM   Modules accepted: Orders

## 2013-02-02 LAB — GC/CHLAMYDIA PROBE AMP: GC Probe RNA: NEGATIVE

## 2013-02-02 NOTE — Progress Notes (Signed)
Quick Note:  Pt aware. She has not had any further rectal bleeding. She went to her GYN yesterday and they did a hemoccult and said it was negative. She is doing ok right now and will try the linzess and let us know how she is doing. Lab order mailed to pt.  Do you want to nic ov in Feb 2015? ______

## 2013-02-06 ENCOUNTER — Other Ambulatory Visit: Payer: Self-pay | Admitting: Gastroenterology

## 2013-02-06 ENCOUNTER — Other Ambulatory Visit: Payer: Self-pay

## 2013-02-06 DIAGNOSIS — K625 Hemorrhage of anus and rectum: Secondary | ICD-10-CM

## 2013-02-06 MED ORDER — LINACLOTIDE 145 MCG PO CAPS: 145.0000 ug | ORAL_CAPSULE | Freq: Every day | ORAL | Status: DC

## 2013-02-08 ENCOUNTER — Ambulatory Visit (HOSPITAL_COMMUNITY)
Admission: RE | Admit: 2013-02-08 | Discharge: 2013-02-08 | Disposition: A | Discharge status: Home / Self Care | Payer: PRIVATE HEALTH INSURANCE | Source: Ambulatory Visit | Attending: Orthopedic Surgery | Admitting: Orthopedic Surgery

## 2013-02-08 ENCOUNTER — Encounter (HOSPITAL_COMMUNITY): Payer: Self-pay

## 2013-02-08 DIAGNOSIS — M5030 Other cervical disc degeneration, unspecified cervical region: Secondary | ICD-10-CM | POA: Insufficient documentation

## 2013-02-08 DIAGNOSIS — M75102 Unspecified rotator cuff tear or rupture of left shoulder, not specified as traumatic: Secondary | ICD-10-CM

## 2013-02-08 DIAGNOSIS — M5410 Radiculopathy, site unspecified: Secondary | ICD-10-CM

## 2013-02-08 DIAGNOSIS — M5020 Other cervical disc displacement, unspecified cervical region: Secondary | ICD-10-CM | POA: Insufficient documentation

## 2013-02-08 DIAGNOSIS — M25519 Pain in unspecified shoulder: Secondary | ICD-10-CM | POA: Insufficient documentation

## 2013-02-19 ENCOUNTER — Ambulatory Visit (INDEPENDENT_AMBULATORY_CARE_PROVIDER_SITE_OTHER): Payer: PRIVATE HEALTH INSURANCE | Admitting: Orthopedic Surgery

## 2013-02-19 VITALS — BP 124/48 | Ht 64.5 in | Wt 189.0 lb

## 2013-02-19 DIAGNOSIS — G90519 Complex regional pain syndrome I of unspecified upper limb: Secondary | ICD-10-CM

## 2013-02-19 DIAGNOSIS — M5412 Radiculopathy, cervical region: Secondary | ICD-10-CM

## 2013-02-19 DIAGNOSIS — Z9889 Other specified postprocedural states: Secondary | ICD-10-CM | POA: Insufficient documentation

## 2013-02-19 DIAGNOSIS — M5010 Cervical disc disorder with radiculopathy, unspecified cervical region: Secondary | ICD-10-CM

## 2013-02-19 DIAGNOSIS — G90512 Complex regional pain syndrome I of left upper limb: Secondary | ICD-10-CM

## 2013-02-19 MED ORDER — HYDROCODONE-ACETAMINOPHEN 5-325 MG PO TABS: 1.0000 | ORAL_TABLET | ORAL | Status: DC | PRN

## 2013-02-19 NOTE — Progress Notes (Signed)
Patient ID: Bethany Wang, female   DOB: 1965-04-13, 47 y.o.   MRN: [id number]  Chief Complaint  Patient presents with  . Results    MRI and Nerve Conduction Study   The patient MRI of the shoulder and cervical spine as well as I nerve conduction study. The nerve conduction study was normal  The MRI showed an intact rotator cuff  The patient also had cervical MRI which showed cervical disc disease  IMPRESSION: 1. Mildly progressive disc degeneration with shallow central disc protrusions at C5-6 and C6-7 since 2007. No cord deformity or foraminal compromise. 2. No osseous, cord or paraspinous abnormalities identified. 3. Grossly stable bilateral thyroid lesions .   The patient's MRI of the shoulder does not indicate any surgical lesion  Recommend Dr. Channing Mutters consultation for evaluation and treatment of the cervical disc disease.  Meds ordered this encounter  Medications  . HYDROcodone-acetaminophen (NORCO/VICODIN) 5-325 MG per tablet    Sig: Take 1 tablet by mouth every 4 (four) hours as needed.    Dispense:  30 tablet    Refill:  0

## 2013-02-19 NOTE — Patient Instructions (Signed)
Referral to Dr. Channing Mutters in [place] for C- spine

## 2013-02-20 ENCOUNTER — Telehealth: Payer: Self-pay | Admitting: *Deleted

## 2013-02-20 ENCOUNTER — Other Ambulatory Visit: Payer: Self-pay | Admitting: *Deleted

## 2013-02-20 DIAGNOSIS — M5090 Cervical disc disorder, unspecified, unspecified cervical region: Secondary | ICD-10-CM

## 2013-02-20 NOTE — Telephone Encounter (Signed)
Referral and office notes faxed to Dr. Roy. Awaiting appointment. 

## 2013-03-05 LAB — CBC WITH DIFFERENTIAL/PLATELET
Basophils Absolute: 0 10*3/uL (ref 0.0–0.1)
Eosinophils Relative: 2 % (ref 0–5)
HCT: 35.3 % — ABNORMAL LOW (ref 36.0–46.0)
Hemoglobin: 12 g/dL (ref 12.0–15.0)
Lymphocytes Relative: 34 % (ref 12–46)
MCHC: 34 g/dL (ref 30.0–36.0)
MCV: 82.1 fL (ref 78.0–100.0)
Monocytes Absolute: 0.6 10*3/uL (ref 0.1–1.0)
RDW: 15.9 % — ABNORMAL HIGH (ref 11.5–15.5)
WBC: 8 10*3/uL (ref 4.0–10.5)

## 2013-03-20 ENCOUNTER — Telehealth: Payer: Self-pay | Admitting: Cardiology

## 2013-03-20 NOTE — Telephone Encounter (Signed)
Patient called to see if the prednisone 5 mg dose pak she was put on today by Dr. Sherryll Burger for gout flare up will interact with her cardiac medications. Nurse checked for interactions and informed nurse that none were found.

## 2013-03-21 NOTE — Telephone Encounter (Addendum)
Received fax back from Dr. Temple Pacini office stating Dr. Channing Mutters has reviewed Bethany Wang's chart, and he has declined to see her, stating her condition does not require a neurosurgeon. Do you hane any further recommendations?

## 2013-03-22 NOTE — Telephone Encounter (Signed)
Routing to [employer] and [employer]

## 2013-03-22 NOTE — Telephone Encounter (Signed)
NO AND DONT SCHEDULE HERE

## 2013-03-23 NOTE — Telephone Encounter (Signed)
Noted  

## 2013-04-03 ENCOUNTER — Encounter: Payer: Self-pay | Admitting: Orthopedic Surgery

## 2013-05-11 ENCOUNTER — Other Ambulatory Visit: Payer: Self-pay | Admitting: Physician Assistant

## 2013-05-11 ENCOUNTER — Other Ambulatory Visit: Payer: Self-pay | Admitting: Adult Health

## 2013-05-11 ENCOUNTER — Encounter: Payer: Self-pay | Admitting: Cardiology

## 2013-05-11 ENCOUNTER — Ambulatory Visit (INDEPENDENT_AMBULATORY_CARE_PROVIDER_SITE_OTHER): Payer: PRIVATE HEALTH INSURANCE | Admitting: Cardiology

## 2013-05-11 ENCOUNTER — Encounter (INDEPENDENT_AMBULATORY_CARE_PROVIDER_SITE_OTHER): Payer: Self-pay

## 2013-05-11 VITALS — BP 125/86 | HR 79 | Ht 64.0 in | Wt 175.0 lb

## 2013-05-11 DIAGNOSIS — Z79899 Other long term (current) drug therapy: Secondary | ICD-10-CM

## 2013-05-11 DIAGNOSIS — I10 Essential (primary) hypertension: Secondary | ICD-10-CM

## 2013-05-11 DIAGNOSIS — E785 Hyperlipidemia, unspecified: Secondary | ICD-10-CM

## 2013-05-11 DIAGNOSIS — I2510 Atherosclerotic heart disease of native coronary artery without angina pectoris: Secondary | ICD-10-CM

## 2013-05-11 NOTE — Progress Notes (Signed)
Clinical Summary [name] is a 48 y.o.female seen by me for the first time in July 2014. She reports no new cardiac symptoms since last visit. She states she has been walking up to 6 miles a day, no angina or unusual breathlessness. She has a history of DES to the RCA in December 2013 at [facility] followed by DES to the LAD at [facility] in February 2014 (prior patient of Dr. Dannielle Wang).  She did voice a significant amount of frustration with having to defer followup testing including GI workup with endoscopy per Dr. Gala Wang, and also reportedly cervical neck surgery due to a radiculopathy. She has been followed by Dr. Aline Wang, and states that she was referred to see Dr. Carloyn Wang, but that their office would not see her since she could not come off antiplatelet agents. I reviewed the records and telephone notes. Our recommendation from October 2014 was that she not interrupt DAPT less than one year from her last DES unlless surgery or procedure was felt to be urgently needed. Rationale for this being standard recommendations following DES for reduction in occurrence of stent thrombosis. I discussed this with the patient in detail today and she voiced understanding.  She states she is having significant neck and arm pain as well as weakness, some pain in her right hip when she walks. She has also had difficulty with nausea and some "stomach" problems, expects to have an EGD with Dr. Gala Wang.   Allergies  Allergen Reactions  . Penicillins Anaphylaxis    Patient states she carries an epi-pen    Current Outpatient Prescriptions  Medication Sig Dispense Refill  . amLODipine (NORVASC) 2.5 MG tablet Take 5 mg by mouth daily.       [name] aspirin 81 MG chewable tablet Chew 1 tablet (81 mg total) by mouth daily.      [name] atorvastatin (LIPITOR) 80 MG tablet Take 1 tablet (80 mg total) by mouth every evening.  30 tablet  6  . Glycerin (OASIS MOISTURIZING MOUTH SPRAY MT) Use as directed 2 sprays in the mouth or throat as  needed.      [name] HYDROcodone-acetaminophen (NORCO/VICODIN) 5-325 MG per tablet Take 1 tablet by mouth every 4 (four) hours as needed.  30 tablet  0  . Linaclotide (LINZESS) 145 MCG CAPS capsule Take 1 capsule (145 mcg total) by mouth daily.  30 capsule  5  . lisinopril-hydrochlorothiazide (PRINZIDE,ZESTORETIC) 20-12.5 MG per tablet Take 1 tablet by mouth daily.      . nitroGLYCERIN (NITROSTAT) 0.4 MG SL tablet Place 1 tablet (0.4 mg total) under the tongue every 5 (five) minutes x 3 doses as needed for chest pain.  100 tablet  6  . pantoprazole (PROTONIX) 40 MG tablet Take 1 tablet (40 mg total) by mouth 2 (two) times daily.  60 tablet  6  . triamcinolone lotion (KENALOG) 0.1 % Apply 1 application topically as needed.        No current facility-administered medications for this visit.    Past Medical History  Diagnosis Date  . 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 Seizures   . Essential hypertension, benign   . Mixed hyperlipidemia   . GERD (gastroesophageal reflux disease)   . Suicide attempt     3 attempts in remote past  . PUD (peptic ulcer disease)   . Coronary atherosclerosis of native coronary artery     DES to RCA 03/2012, DES to LAD at [facility]  05/2012  . Type 2 diabetes mellitus   . History of hematuria   . Bradycardia     Social History [name] reports that she quit smoking about 13 months ago. Her smoking use included Cigarettes. She has a 6.4 pack-year smoking history. She has never used smokeless tobacco. [name] reports that she does not drink alcohol.  Review of Systems Negative except as outlined above.  Physical Examination Filed Vitals:   05/11/13 1037  BP: 125/86  Pulse: 79   Filed Weights   05/11/13 1037  Weight: 175 lb (79.379 kg)    Comfortable at rest.  HEENT: Conjunctiva and lids normal, oropharynx clear.  Neck: Supple, no elevated JVP or carotid bruits, no thyromegaly.  Lungs: Clear to auscultation, nonlabored  breathing at rest.  Cardiac: Regular rate and rhythm, no S3 or significant systolic murmur, no pericardial rub.  Abdomen: Soft, nontender, bowel sounds present.  Extremities: Left foot edema, distal pulses 2+.  Skin: Warm and dry.  Musculoskeletal: No kyphosis.  Neuropsychiatric: Alert and oriented x3, affect grossly appropriate.   Problem List and Plan   Coronary atherosclerosis of native coronary artery Symptomatically stable on medical therapy, last intervention being DES to the LAD at [facility] in February 2014. She completes one year course of Brilinta in February, which is right around the corner. I told her to go ahead and contact Dr. Aline Wang and Dr. Gala Wang regarding plans for her expected testing and potential surgery. She is at a point that she can stop Brilinta, and even temporarily hold aspirin if needed. Otherwise we will plan a followup visit in 6 months.  HLD (hyperlipidemia) She continues on high-dose Lipitor, tolerating the medication. Followup FLP and LFT will be obtained.  Essential hypertension, benign Blood pressure control is good today. No changes made in regimen.    Satira Sark, M.D., F.A.C.C.

## 2013-05-11 NOTE — Assessment & Plan Note (Signed)
Blood pressure control is good today. No changes made in regimen.

## 2013-05-11 NOTE — Patient Instructions (Addendum)
   After finishing this last refill - Stop Brilinta.   Continue all other medications.   Labs for lipid & liver function Office will contact with results via phone or letter.   Your physician wants you to follow up in: 6 months.  You will receive a reminder letter in the mail one-two months in advance.  If you don't receive a letter, please call our office to schedule the follow up appointment

## 2013-05-11 NOTE — Assessment & Plan Note (Signed)
Symptomatically stable on medical therapy, last intervention being DES to the LAD at [facility] in February 2014. She completes one year course of Brilinta in February, which is right around the corner. I told her to go ahead and contact Dr. Aline Brochure and Dr. Gala Romney regarding plans for her expected testing and potential surgery. She is at a point that she can stop Brilinta, and even temporarily hold aspirin if needed. Otherwise we will plan a followup visit in 6 months.

## 2013-05-11 NOTE — Assessment & Plan Note (Signed)
She continues on high-dose Lipitor, tolerating the medication. Followup FLP and LFT will be obtained.

## 2013-05-16 ENCOUNTER — Telehealth: Payer: Self-pay | Admitting: Internal Medicine

## 2013-05-16 NOTE — Telephone Encounter (Signed)
I have not seen any records on this pt.

## 2013-05-16 NOTE — Telephone Encounter (Signed)
Pt received letter that it was time to make her follow up visit with [place] and is aware of OV on 2/23 at 1130 with LSL. Patient said we were supposed to get medical records from her heart doctor and if we have received them or not. I told her I would check because I don't know. She said that she was at her heart doctor's office on January 30 and they said that they would be sending [place] her records. Please advise if we ever received them or not. [phone]

## 2013-05-17 NOTE — Telephone Encounter (Signed)
Pt's goes to [facility], Notes are in [facility]

## 2013-05-21 ENCOUNTER — Other Ambulatory Visit: Payer: Self-pay | Admitting: Cardiology

## 2013-05-22 ENCOUNTER — Telehealth: Payer: Self-pay | Admitting: *Deleted

## 2013-05-22 LAB — HEPATIC FUNCTION PANEL
ALBUMIN: 3.8 g/dL (ref 3.5–5.2)
ALK PHOS: 118 U/L — AB (ref 39–117)
ALT: 19 U/L (ref 0–35)
AST: 22 U/L (ref 0–37)
BILIRUBIN TOTAL: 0.4 mg/dL (ref 0.2–1.2)
Bilirubin, Direct: 0.1 mg/dL (ref 0.0–0.3)
Indirect Bilirubin: 0.3 mg/dL (ref 0.2–1.2)
Total Protein: 6.4 g/dL (ref 6.0–8.3)

## 2013-05-22 LAB — LIPID PANEL
CHOLESTEROL: 186 mg/dL (ref 0–200)
HDL: 40 mg/dL (ref >39)
LDL Cholesterol: 123 mg/dL — ABNORMAL HIGH (ref 0–99)
Total CHOL/HDL Ratio: 4.7 Ratio
Triglycerides: 115 mg/dL (ref <150)
VLDL: 23 mg/dL (ref 0–40)

## 2013-05-22 NOTE — Telephone Encounter (Signed)
Message copied by Merlene Laughter on Tue May 22, 2013  4:49 PM ------      Message from: Satira Sark      Created: Tue May 22, 2013  7:55 AM       Reviewed. Total cholesterol and LDL have come down on Lipitor, although still not in optimal range. LDL 123. Reinforced compliance with Lipitor, also low saturated fat diet. ------

## 2013-05-23 NOTE — Telephone Encounter (Signed)
Patient informed. 

## 2013-05-28 ENCOUNTER — Telehealth: Payer: Self-pay | Admitting: Orthopedic Surgery

## 2013-05-28 NOTE — Telephone Encounter (Signed)
Faxed patient's office notes and reports ((office visit dates of service 01/24/13, 02/19/13 and imaging reports to primary care, Dr. Monico Blitz, (Fax # [phone]), in response to receipt of a new referral.  Included as well is the note from Dr. Glenna Fellows, neurosurgeon, to whom patient was referred, and who had declined request for appointment.  Dr. Ruthe Mannan notes include mention of possible referral to pain management, and also that we are unable to schedule back here or offer additional treatment options.

## 2013-06-04 ENCOUNTER — Telehealth: Payer: Self-pay | Admitting: Internal Medicine

## 2013-06-04 ENCOUNTER — Ambulatory Visit (INDEPENDENT_AMBULATORY_CARE_PROVIDER_SITE_OTHER): Payer: PRIVATE HEALTH INSURANCE | Admitting: Gastroenterology

## 2013-06-04 ENCOUNTER — Encounter: Payer: Self-pay | Admitting: Gastroenterology

## 2013-06-04 VITALS — BP 108/74 | HR 65 | Temp 98.4°F | Wt 174.0 lb

## 2013-06-04 DIAGNOSIS — K625 Hemorrhage of anus and rectum: Secondary | ICD-10-CM

## 2013-06-04 NOTE — Telephone Encounter (Signed)
I overheard the patient's conversation with Bethany Wang from my office. She left at 1150 am, twenty minutes after her scheduled appointment time.

## 2013-06-04 NOTE — Telephone Encounter (Signed)
Noted  

## 2013-06-04 NOTE — Telephone Encounter (Signed)
Pt had OV today with LSL at 1130. Pt checks in at 1052. GW took patient back at 1141. Patient came out a short time afterwards raising her voice at me that she wants her name taken off our list of patients and she was coming back later to get her medical records and that she was done with this ______ing office. Patient was loud and acted very rude to me at the window. I will gladly get her medical records for her after she signs a release of information. Patient is upset because she has waited an hour, but she probably doesn't realize that she showed up a half hour early.

## 2013-06-05 NOTE — Progress Notes (Signed)
See separate telephone note for 06/04/13.

## 2013-07-10 ENCOUNTER — Other Ambulatory Visit: Payer: Self-pay

## 2013-07-10 DIAGNOSIS — Z9889 Other specified postprocedural states: Secondary | ICD-10-CM

## 2013-07-10 DIAGNOSIS — Z1231 Encounter for screening mammogram for malignant neoplasm of breast: Secondary | ICD-10-CM

## 2013-07-10 DIAGNOSIS — Z853 Personal history of malignant neoplasm of breast: Secondary | ICD-10-CM

## 2013-07-18 ENCOUNTER — Ambulatory Visit: Payer: PRIVATE HEALTH INSURANCE

## 2013-12-14 ENCOUNTER — Encounter: Payer: Self-pay | Admitting: Cardiology

## 2013-12-14 ENCOUNTER — Ambulatory Visit (INDEPENDENT_AMBULATORY_CARE_PROVIDER_SITE_OTHER): Payer: PRIVATE HEALTH INSURANCE | Admitting: Cardiology

## 2013-12-14 VITALS — BP 109/76 | HR 70 | Ht 64.0 in | Wt 162.0 lb

## 2013-12-14 DIAGNOSIS — I10 Essential (primary) hypertension: Secondary | ICD-10-CM

## 2013-12-14 DIAGNOSIS — E785 Hyperlipidemia, unspecified: Secondary | ICD-10-CM

## 2013-12-14 DIAGNOSIS — I200 Unstable angina: Secondary | ICD-10-CM

## 2013-12-14 DIAGNOSIS — I2510 Atherosclerotic heart disease of native coronary artery without angina pectoris: Secondary | ICD-10-CM

## 2013-12-14 DIAGNOSIS — R55 Syncope and collapse: Secondary | ICD-10-CM

## 2013-12-14 MED ORDER — ATORVASTATIN CALCIUM 80 MG PO TABS: 80.0000 mg | ORAL_TABLET | Freq: Every day | ORAL | Status: DC

## 2013-12-14 MED ORDER — NITROGLYCERIN 0.4 MG SL SUBL: 0.4000 mg | SUBLINGUAL_TABLET | SUBLINGUAL | Status: DC | PRN

## 2013-12-14 NOTE — Assessment & Plan Note (Signed)
Blood pressure today is normal, off antihypertensives for the last 3 days by her report. Continue to observe, may need to add back single antihypertensive slowly.

## 2013-12-14 NOTE — Progress Notes (Signed)
Clinical Summary [name] is a 48 y.o.female last seen in January. She presents to the office after a recent stay at [facility]. I do not have complete records. There is no discharge summary available. She states that she was having an intense argument with her children and became very upset, was already noted to have a low blood pressure that morning of "98" per husband, had an episode of syncope. Possibly some chest discomfort as well but no palpitations. [place] records were reviewed. She had no clear evidence of ACS by enzymes. She was noted to be bradycardic and mildly hypotensive during a portion of her stay. She states she has been off Norvasc and lisinopril HCTZ for the last 3 days at least. Blood pressure today is normal as is heart rate.  Lipid panel from February showed cholesterol 186, triglycerides 1:15, HDL 40, LDL 123. She has been out of Lipitor.  At baseline she was not any rate lowering medications with prior history of bradycardia. Etiology of her recent presentation is not clear, question neurocardiogenic event. We decided to follow her off antihypertensives, she has followup with Dr. Manuella Ghazi next week. ECG today shows normal sinus rhythm at 70 beats per minute.  She has a history of DES to the RCA in December 2013 at [facility] followed by DES to the LAD at [facility] in February 2014 (prior patient of Dr. Dannielle Burn).   Allergies  Allergen Reactions  . Penicillins Anaphylaxis    Patient states she carries an epi-pen    Current Outpatient Prescriptions  Medication Sig Dispense Refill  . acetaminophen (TYLENOL) 500 MG tablet Take 500 mg by mouth every 6 (six) hours as needed.      [name] aspirin 81 MG chewable tablet Chew 1 tablet (81 mg total) by mouth daily.      [name] atorvastatin (LIPITOR) 80 MG tablet Take 1 tablet (80 mg total) by mouth daily.  30 tablet  6  . escitalopram (LEXAPRO) 20 MG tablet Take 20 mg by mouth daily.      [name] gabapentin (NEURONTIN) 300 MG capsule Take 300 mg by  mouth 3 (three) times daily. As directed      . Glycerin (OASIS MOISTURIZING MOUTH SPRAY MT) Use as directed 2 sprays in the mouth or throat as needed.      . Linaclotide (LINZESS) 145 MCG CAPS capsule Take 1 capsule (145 mcg total) by mouth daily.  30 capsule  5  . nitroGLYCERIN (NITROSTAT) 0.4 MG SL tablet Place 1 tablet (0.4 mg total) under the tongue every 5 (five) minutes x 3 doses as needed for chest pain.  25 tablet  3  . pantoprazole (PROTONIX) 40 MG tablet Take 1 tablet (40 mg total) by mouth 2 (two) times daily.  60 tablet  6  . promethazine (PHENERGAN) 25 MG tablet Take 25 mg by mouth 2 (two) times daily as needed for nausea or vomiting.      [name] tiZANidine (ZANAFLEX) 4 MG tablet Take 4 mg by mouth every 8 (eight) hours as needed for muscle spasms.      [name] triamcinolone lotion (KENALOG) 0.1 % Apply 1 application topically as needed.        No current facility-administered medications for this visit.    Past Medical History  Diagnosis Date  . 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 Seizures   . Essential hypertension, benign   . Mixed hyperlipidemia   . GERD (gastroesophageal reflux disease)   .  Suicide attempt     3 attempts in remote past  . PUD (peptic ulcer disease)   . Coronary atherosclerosis of native coronary artery     DES to RCA 03/2012, DES to LAD at [facility] 05/2012  . Type 2 diabetes mellitus   . History of hematuria   . Bradycardia     Social History [name] reports that she quit smoking about 20 months ago. Her smoking use included Cigarettes. She has a 6.4 pack-year smoking history. She has never used smokeless tobacco. [name] reports that she does not drink alcohol.  Review of Systems Other systems reviewed and negative.  Physical Examination Filed Vitals:   12/14/13 1620  BP: 109/76  Pulse: 70   Filed Weights   12/14/13 1620  Weight: 162 lb (73.483 kg)    HEENT: Conjunctiva and lids normal, oropharynx clear.    Neck: Supple, no elevated JVP or carotid bruits, no thyromegaly.  Lungs: Clear to auscultation, nonlabored breathing at rest.  Cardiac: Regular rate and rhythm, no S3 or significant systolic murmur, no pericardial rub.  Abdomen: Soft, nontender, bowel sounds present.  Extremities: Left foot edema, distal pulses 2+.  Skin: Warm and dry.  Musculoskeletal: No kyphosis.  Neuropsychiatric: Alert and oriented x3, affect grossly appropriate.   Problem List and Plan   Syncope Recent episode, etiology not entirely clear. Reviewed records from [place]. For now would stay off of Norvasc and lisinopril HCTZ, follow blood pressure with Dr. Manuella Ghazi next week. She also has a blood pressure cuff at home to make measurements. Could have been related to relatively low blood pressures or potentially neurocardiogenic syncope.  Coronary atherosclerosis of native coronary artery No evidence of ACS during recent hospital stay. ECG today is normal. Continue aspirin, refilled Lipitor. Also given prescription for nitroglycerin which had run out.  Essential hypertension, benign Blood pressure today is normal, off antihypertensives for the last 3 days by her report. Continue to observe, may need to add back single antihypertensive slowly.  HLD (hyperlipidemia) Refill provided for Lipitor.    Satira Sark, M.D., F.A.C.C.

## 2013-12-14 NOTE — Assessment & Plan Note (Signed)
Refill provided for Lipitor.

## 2013-12-14 NOTE — Assessment & Plan Note (Signed)
No evidence of ACS during recent hospital stay. ECG today is normal. Continue aspirin, refilled Lipitor. Also given prescription for nitroglycerin which had run out.

## 2013-12-14 NOTE — Patient Instructions (Signed)
   Remain off of the Norvasc & the Lisinopril / HCTZ  Refill sent to pharm for Nitroglycerin & Lipitor Continue all other medications.   Keep follow up with Dr. Manuella Ghazi next week Follow up in  6 weeks

## 2013-12-14 NOTE — Assessment & Plan Note (Signed)
Recent episode, etiology not entirely clear. Reviewed records from [place]. For now would stay off of Norvasc and lisinopril HCTZ, follow blood pressure with Dr. Manuella Ghazi next week. She also has a blood pressure cuff at home to make measurements. Could have been related to relatively low blood pressures or potentially neurocardiogenic syncope.

## 2013-12-19 ENCOUNTER — Other Ambulatory Visit: Payer: Self-pay | Admitting: Internal Medicine

## 2013-12-19 ENCOUNTER — Other Ambulatory Visit: Payer: Self-pay

## 2013-12-19 DIAGNOSIS — Z853 Personal history of malignant neoplasm of breast: Secondary | ICD-10-CM

## 2013-12-19 DIAGNOSIS — Z9889 Other specified postprocedural states: Secondary | ICD-10-CM

## 2013-12-19 DIAGNOSIS — N6320 Unspecified lump in the left breast, unspecified quadrant: Secondary | ICD-10-CM

## 2013-12-19 DIAGNOSIS — N6310 Unspecified lump in the right breast, unspecified quadrant: Secondary | ICD-10-CM

## 2013-12-27 ENCOUNTER — Other Ambulatory Visit: Payer: PRIVATE HEALTH INSURANCE

## 2014-01-03 ENCOUNTER — Ambulatory Visit
Admission: RE | Admit: 2014-01-03 | Discharge: 2014-01-03 | Disposition: A | Discharge status: Home / Self Care | Payer: PRIVATE HEALTH INSURANCE | Source: Ambulatory Visit | Attending: Internal Medicine | Admitting: Internal Medicine

## 2014-01-03 ENCOUNTER — Encounter (INDEPENDENT_AMBULATORY_CARE_PROVIDER_SITE_OTHER): Payer: Self-pay

## 2014-01-03 DIAGNOSIS — Z9889 Other specified postprocedural states: Secondary | ICD-10-CM

## 2014-01-03 DIAGNOSIS — Z853 Personal history of malignant neoplasm of breast: Secondary | ICD-10-CM

## 2014-01-03 DIAGNOSIS — N6320 Unspecified lump in the left breast, unspecified quadrant: Secondary | ICD-10-CM

## 2014-01-03 DIAGNOSIS — N6310 Unspecified lump in the right breast, unspecified quadrant: Secondary | ICD-10-CM

## 2014-01-18 ENCOUNTER — Encounter: Payer: Self-pay | Admitting: Cardiology

## 2014-01-25 ENCOUNTER — Ambulatory Visit (INDEPENDENT_AMBULATORY_CARE_PROVIDER_SITE_OTHER): Payer: PRIVATE HEALTH INSURANCE | Admitting: Cardiology

## 2014-01-25 ENCOUNTER — Encounter: Payer: Self-pay | Admitting: Cardiology

## 2014-01-25 VITALS — BP 141/92 | HR 55 | Ht 64.0 in | Wt 161.5 lb

## 2014-01-25 DIAGNOSIS — I2510 Atherosclerotic heart disease of native coronary artery without angina pectoris: Secondary | ICD-10-CM

## 2014-01-25 DIAGNOSIS — R55 Syncope and collapse: Secondary | ICD-10-CM

## 2014-01-25 DIAGNOSIS — I10 Essential (primary) hypertension: Secondary | ICD-10-CM

## 2014-01-25 NOTE — Assessment & Plan Note (Signed)
Off antihypertensives at this time. She was previously on lisinopril and Norvasc.

## 2014-01-25 NOTE — Patient Instructions (Signed)
Your physician has recommended that you wear a 30 day event monitor. Event monitors are medical devices that record the heart's electrical activity. Doctors most often us these monitors to diagnose arrhythmias. Arrhythmias are problems with the speed or rhythm of the heartbeat. The monitor is a small, portable device. You can wear one while you do your normal daily activities. This is usually used to diagnose what is causing palpitations/syncope (passing out). Office will contact with results via phone or letter.   Continue all current medications. Follow up in  1 month.   

## 2014-01-25 NOTE — Assessment & Plan Note (Signed)
Still suspect neurocardiogenic syncope based on description. This was discussed as outlined above. No new medications are being started at this time. We will obtain a 30 day event monitor, just to make sure that there are no other arrhythmogenic triggers. Followup arranged.

## 2014-01-25 NOTE — Progress Notes (Signed)
Clinical Summary [name] is a 48 y.o.female last seen in September. At that time she was seen in followup of a hospital stay at [facility] after episode of reported syncope. She had been taken off of Norvasc and lisinopril with relatively low blood pressure, possibly also neurocardiogenic event. I see that she was readmitted to [facility] after another reported syncopal event in October. Evaluated by Dr. Manuella Ghazi. Troponin I levels were normal. No discharge summary.  She is here today for a followup visit. She describes the event that occurred most recently. She was in the bank doing a transaction, standing up, started to feel "bad" followed by sweating and dizziness. She had no palpitations or chest pain. States that she felt she was going to pass out and started the sink to the floor, was helped down by a bystander.  She has a history of DES to the RCA in December 2013 at [facility] followed by DES to the LAD at [facility] in February 2014 (prior patient of Dr. Dannielle Burn).  Today we discussed her symptoms, they still sound very much like neurocardiogenic syncope, although the trigger is not entirely clear. She is off antihypertensives. We discussed maintaining a well-hydrated status, trying to correlate any particular triggers for these events, and immediately sitting down or attaining a supine position if possible at the onset of symptoms. She mentioned a "beta blocker" had been discussed with her by Dr. Manuella Ghazi, although I would hold off on this in light of her resting bradycardia. Other possibilities would be volume expanders if needed. Although unlikely to be arrhythmogenic, we did discuss a monitor to make sure nothing else is going on from this perspective.   Allergies  Allergen Reactions  . Penicillins Anaphylaxis    Patient states she carries an epi-pen    Current Outpatient Prescriptions  Medication Sig Dispense Refill  . acetaminophen (TYLENOL) 500 MG tablet Take 500 mg by mouth every 6 (six)  hours as needed.      [name] aspirin 81 MG chewable tablet Chew 1 tablet (81 mg total) by mouth daily.      [name] atorvastatin (LIPITOR) 80 MG tablet Take 1 tablet (80 mg total) by mouth daily.  30 tablet  6  . escitalopram (LEXAPRO) 20 MG tablet Take 20 mg by mouth daily.      [name] gabapentin (NEURONTIN) 300 MG capsule Take 300 mg by mouth 3 (three) times daily. As directed      . Glycerin (OASIS MOISTURIZING MOUTH SPRAY MT) Use as directed 2 sprays in the mouth or throat as needed.      . Linaclotide (LINZESS) 145 MCG CAPS capsule Take 1 capsule (145 mcg total) by mouth daily.  30 capsule  5  . nitroGLYCERIN (NITROSTAT) 0.4 MG SL tablet Place 1 tablet (0.4 mg total) under the tongue every 5 (five) minutes x 3 doses as needed for chest pain.  25 tablet  3  . pantoprazole (PROTONIX) 40 MG tablet Take 1 tablet (40 mg total) by mouth 2 (two) times daily.  60 tablet  6  . promethazine (PHENERGAN) 25 MG tablet Take 25 mg by mouth 2 (two) times daily as needed for nausea or vomiting.      [name] tiZANidine (ZANAFLEX) 4 MG tablet Take 4 mg by mouth every 8 (eight) hours as needed for muscle spasms.      [name] triamcinolone lotion (KENALOG) 0.1 % Apply 1 application topically as needed.        No current facility-administered medications for  this visit.    Past Medical History  Diagnosis Date  . 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 Seizures   . Essential hypertension, benign   . Mixed hyperlipidemia   . GERD (gastroesophageal reflux disease)   . Suicide attempt     3 attempts in remote past  . PUD (peptic ulcer disease)   . Coronary atherosclerosis of native coronary artery     DES to RCA 03/2012, DES to LAD at [facility] 05/2012  . Type 2 diabetes mellitus   . History of hematuria   . Bradycardia     Social History [name] reports that she quit smoking about 21 months ago. Her smoking use included Cigarettes. She has a 6.4 pack-year smoking history. She has never used smokeless  tobacco. [name] reports that she does not drink alcohol.  Review of Systems Other systems reviewed and negative.  Physical Examination Filed Vitals:   01/25/14 1600  BP: 141/92  Pulse: 55   Filed Weights   01/25/14 1600  Weight: 161 lb 8 oz (73.256 kg)    Appears comfortable. HEENT: Conjunctiva and lids normal, oropharynx clear.  Neck: Supple, no elevated JVP or carotid bruits, no thyromegaly.  Lungs: Clear to auscultation, nonlabored breathing at rest.  Cardiac: Regular rate and rhythm, no S3 or significant systolic murmur, no pericardial rub.  Abdomen: Soft, nontender, bowel sounds present.  Extremities: No pitting edema, distal pulses 2+.  Skin: Warm and dry.  Musculoskeletal: No kyphosis.  Neuropsychiatric: Alert and oriented x3, affect grossly appropriate.    Problem List and Plan   Syncope Still suspect neurocardiogenic syncope based on description. This was discussed as outlined above. No new medications are being started at this time. We will obtain a 30 day event monitor, just to make sure that there are no other arrhythmogenic triggers. Followup arranged.  Essential hypertension, benign Off antihypertensives at this time. She was previously on lisinopril and Norvasc.  Coronary atherosclerosis of native coronary artery No angina symptoms. Troponin I levels negative during recent hospitalizations.    Satira Sark, M.D., F.A.C.C.

## 2014-01-25 NOTE — Assessment & Plan Note (Signed)
No angina symptoms. Troponin I levels negative during recent hospitalizations.

## 2014-01-29 ENCOUNTER — Other Ambulatory Visit: Payer: Self-pay | Admitting: *Deleted

## 2014-01-29 DIAGNOSIS — R55 Syncope and collapse: Secondary | ICD-10-CM

## 2014-02-01 ENCOUNTER — Telehealth: Payer: Self-pay | Admitting: Cardiology

## 2014-02-01 NOTE — Telephone Encounter (Signed)
Patient has not received her 30 day event monitor

## 2014-02-04 ENCOUNTER — Ambulatory Visit (INDEPENDENT_AMBULATORY_CARE_PROVIDER_SITE_OTHER): Payer: PRIVATE HEALTH INSURANCE | Admitting: Obstetrics and Gynecology

## 2014-02-04 ENCOUNTER — Encounter: Payer: Self-pay | Admitting: Obstetrics and Gynecology

## 2014-02-04 VITALS — BP 132/90 | Ht 64.0 in | Wt 160.5 lb

## 2014-02-04 DIAGNOSIS — Z0000 Encounter for general adult medical examination without abnormal findings: Secondary | ICD-10-CM

## 2014-02-04 DIAGNOSIS — Z01419 Encounter for gynecological examination (general) (routine) without abnormal findings: Secondary | ICD-10-CM | POA: Insufficient documentation

## 2014-02-04 NOTE — Progress Notes (Signed)
Patient ID: Bethany Wang, female   DOB: [birth year], 48 y.o.   MRN: [id number] Pt here today for annual exam. Pt denies any problems or concerns at this time.

## 2014-02-04 NOTE — Progress Notes (Signed)
Patient ID: Bethany Wang, female   DOB: 1966/02/24, 48 y.o.   MRN: [id number]  Assessment:  Annual Gyn Exam Distant history of left breast biopsy reported at [facility] by pt, p.7   Plan:  1. pap smear not needed 2. return annually or prn 3    Annual mammogram advised Subjective:  Bethany Wang is a 48 y.o. female. . No obstetric history on file. who presents for annual exam. No LMP recorded. Patient has had a hysterectomy. The patient is here for an annual exam and PAP smear.  She had a mammogram on 9/24 which revealed some abnormalities that did not appear cancerous.  She states that she has been having breast tenderness in that area on left as well.  She had a lumpectomy performed on her left breast and an abdominal hysterectomy by Dr. Tamala Julian 20 years ago.  She is not using any hormone therapy.  The following portions of the patient's history were reviewed and updated as appropriate: allergies, current medications, past family history, past medical history, past social history, past surgical history and problem list. Past Medical History  Diagnosis Date  . 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 Seizures   . Essential hypertension, benign   . Mixed hyperlipidemia   . GERD (gastroesophageal reflux disease)   . Suicide attempt     3 attempts in remote past  . PUD (peptic ulcer disease)   . Coronary atherosclerosis of native coronary artery     DES to RCA 03/2012, DES to LAD at [facility] 05/2012  . Type 2 diabetes mellitus   . History of hematuria   . Bradycardia     Past Surgical History  Procedure Laterality Date  . Abdominal hysterectomy      fibroids  . Bladder surgery    . Shoulder surgery      left  . Hand surgery      right  . Eye surgery      removed glass  . Left breast lumpectomy      benign  . Colonoscopy  May 2010    Fleishman: normal rectum, internal hemorrhoids, , benign colonic polyp  . Esophagogastroduodenoscopy      2001 Dr. Amedeo Plenty:  distal esophagitis, small hiatal hernia,. Dr. Tamala Julian 2006? no records available currently, pt also reports  EGD a few years ago with Dr. Gala Romney, do not have these reports anywhere in medical records  . Esophagogastroduodenoscopy  06/02/2011    SLP:NPYYFR pill impaction as described above s/p dilation of a probable cervical esophageal web/bx abnormal esophageal and gastric mucosa. + H.pylori gastritis     Current outpatient prescriptions:amLODipine (NORVASC) 5 MG tablet, , Disp: , Rfl: ;  aspirin 81 MG chewable tablet, Chew 1 tablet (81 mg total) by mouth daily., Disp: , Rfl: ;  atorvastatin (LIPITOR) 80 MG tablet, Take 1 tablet (80 mg total) by mouth daily., Disp: 30 tablet, Rfl: 6;  escitalopram (LEXAPRO) 20 MG tablet, Take 20 mg by mouth daily., Disp: , Rfl: ;  HYDROcodone-acetaminophen (NORCO) 10-325 MG per tablet, , Disp: , Rfl:  Linaclotide (LINZESS) 145 MCG CAPS capsule, Take 1 capsule (145 mcg total) by mouth daily., Disp: 30 capsule, Rfl: 5;  lisinopril-hydrochlorothiazide (PRINZIDE,ZESTORETIC) 20-25 MG per tablet, , Disp: , Rfl: ;  pantoprazole (PROTONIX) 40 MG tablet, Take 1 tablet (40 mg total) by mouth 2 (two) times daily., Disp: 60 tablet, Rfl: 6 promethazine (PHENERGAN) 25 MG tablet, Take 25 mg by mouth 2 (two)  times daily as needed for nausea or vomiting., Disp: , Rfl: ;  tiZANidine (ZANAFLEX) 4 MG tablet, Take 4 mg by mouth every 8 (eight) hours as needed for muscle spasms., Disp: , Rfl: ;  triamcinolone lotion (KENALOG) 0.1 %, Apply 1 application topically as needed. , Disp: , Rfl:  nitroGLYCERIN (NITROSTAT) 0.4 MG SL tablet, Place 1 tablet (0.4 mg total) under the tongue every 5 (five) minutes x 3 doses as needed for chest pain., Disp: 25 tablet, Rfl: 3  Review of Systems Constitutional: negative Gastrointestinal: negative Genitourinary: negativeCLINICAL DATA: Patient is a 48 year old female with a reported  history of left breast cancer status post conservation therapy in  1999. She  presents today with current complaints of bilateral focal  breast tenderness. There are no distinct palpable masses on self  breast exam.  EXAM:  DIGITAL DIAGNOSTIC BILATERAL MAMMOGRAM WITH 3D TOMOSYNTHESIS WITH  CAD  ULTRASOUND BILATERAL BREAST  COMPARISON: 10/02/2010 10/04/2011 mammograms  ACR Breast Density Category b: There are scattered areas of  fibroglandular density.  FINDINGS:  Digital bilateral diagnostic mammography demonstrates scattered  fibroglandular densities. Metallic BBs have been placed by the  technologist overlying the superior breast bilaterally denoting  areas of concern as indicated by the patient. Scattered oval  low-density masses of varying size with well circumscribed and  obscured margins are noted in each breast that have waxed and waned  in size dating back to 2012. The patient has a known history of  breast cysts. Spot-compression view of the left breast reveals  effacement of normal fibroglandular tissue. Spot-compression view of  the right breast reveals effacement of normal fibroglandular tissue  and an oval low-density mass well-circumscribed margins measuring  approximately 16 mm. No suspicious microcalcifications  Mammographic images were processed with CAD.  On physical exam, no palpable masses are noted.  Targeted ultrasound is performed bilaterally. This demonstrates  normal fibroglandular tissue in the regions of concern as indicated  by the patient. No conspicuous cystic or solid masses identified.  There are numerous scattered benign anechoic simple cysts of varying  size noted in each breast under real-time sonography which correlate  to the mammographic findings.  IMPRESSION:  No mammographic or sonographic findings of malignancy. There are  numerous scattered benign breast cysts of varying size in each  breast, however they do not correlate to the focal areas of  tenderness indicated by the patient.  RECOMMENDATION:  1. Screening  mammogram in one year unless there are persistent or  intervening clinical concerns.(Code:SM-B-01Y)  2. Recommend continued clinical management of patient's mastalgia.  I have discussed the findings and recommendations with the patient  and the importance of clinical follow-up was stressed. Results were  also provided in writing at the conclusion of the visit. If  applicable, a reminder letter will be sent to the patient regarding  the next appointment.  BI-RADS CATEGORY 2: Benign.  Electronically Signed  By: Andres Shad  On: 01/03/2014 14:04     Objective:  BP 132/90  Ht 5\' 4"  (1.626 m)  Wt 160 lb 8 oz (72.802 kg)  BMI 27.54 kg/m2   BMI: Body mass index is 27.54 kg/(m^2).  General Appearance: Alert, appropriate appearance for age. No acute distress HEENT: Grossly normal Neck / Thyroid:  Cardiovascular: RRR; normal S1, S2, no murmur Lungs: CTA bilaterally Back: No CVAT Abdomen: well-healed surgical scar from abdominal hysterectomy Breast Exam: No dimpling, nipple retraction or discharge. No masses or nodes.  Well-healed biopsy site on the left breast.  Slight  fullness just above and lateral to the old scar, recently checked out thoroughly with diagnostic mammogram and ultrasound Gastrointestinal: Soft, non-tender, no masses or organomegaly Pelvic Exam: Vaginal: normal without tenderness, induration or masses, excellent support, well-estrogenized vaginal tissues, good support Cervix: surgically absent  Adnexa: without tenderness or masses Uterus: surgically absent Rectal: good support, hemoccult is negative Rectovaginal: normal rectal, no masses Lymphatic Exam: Non-palpable nodes in neck, clavicular, axillary, or inguinal regions  Skin: no rash or abnormalities Neurologic: Normal gait and speech, no tremor  Psychiatric: Alert and oriented, appropriate affect.  Urinalysis:Not done  Mallory Shirk. MD Pgr [phone] 9:11 AM     This chart was scribed for Jonnie Kind, MD by Donato Schultz, ED Scribe. This patient was seen in Room 1 and the patient's care was started at 9:11 AM.

## 2014-02-13 ENCOUNTER — Ambulatory Visit (INDEPENDENT_AMBULATORY_CARE_PROVIDER_SITE_OTHER): Payer: PRIVATE HEALTH INSURANCE | Admitting: Diagnostic Neuroimaging

## 2014-02-13 ENCOUNTER — Encounter: Payer: Self-pay | Admitting: Diagnostic Neuroimaging

## 2014-02-13 VITALS — Temp 97.9°F | Ht 64.0 in | Wt 160.4 lb

## 2014-02-13 DIAGNOSIS — G450 Vertebro-basilar artery syndrome: Secondary | ICD-10-CM

## 2014-02-13 DIAGNOSIS — R55 Syncope and collapse: Secondary | ICD-10-CM

## 2014-02-13 NOTE — Progress Notes (Signed)
[facility]  PATIENT: Bethany Wang DOB: 1966/02/12  REFERRING CLINICIAN: Manuella Ghazi HISTORY FROM: patient and husband REASON FOR VISIT: new consult    HISTORICAL  CHIEF COMPLAINT:  Chief Complaint  Patient presents with  . Loss of Consciousness    HISTORY OF PRESENT ILLNESS:   UPDATE 02/13/14: Since last visit, patient here for multiple syncope events. Has had 3 events in the last 2 months. Symptoms start with neck pain, then feeling sweaty, hot sensation, sounds feel like they are in an echo chamber, and then patient collapsed to the ground. No convulsions. Patient is unconscious for 5-10 minutes at a time. She then is able to wake up, with energy drained feeling, sweaty hot feeling. No convulsions. No tongue biting or incontinence. Patient has had some double vision with some of these events.  PRIOR HPI (01/21/12): 48 year old right-handed female with history of hypertension, diabetes, here for evaluation of migraine. Patient reports history of intermittent severe headaches since childhood. Headaches typically start with nausea and vomiting, sensitivity sound, followed by severe frontal headaches. She's been on Friday medications over the years. Nowadays she has to severe migraine days per month. She's tried Topamax but this caused a rash. She's been on amitriptyline since that time to help with migraine control. She also has significant stress in her life with anger issues, and is on Cymbalta, venlafaxine and Depakote. With some of these severe headaches, patient has had episodes where she passes out, falls to the ground and shakes. To the emergency room for evaluation the past. She's also noted by another neurologist, who did EEG, and diagnosed her with nonepileptic spells.   REVIEW OF SYSTEMS: Full 14 system review of systems performed and notable only for weight loss fatigue swelling in legs ringing in ears spinning sensation birthmarks moles blurred vision double  vision cough wheezing anemia easy bruising feeling hot feeling cold increased thirst flushing joint pain joint swelling aching muscles headache numbness weakness insomnia sleepiness restless legs dizziness passing out depression anxiety not asleep decreased energy change.   ALLERGIES: Allergies  Allergen Reactions  . Penicillins Anaphylaxis    Patient states she carries an epi-pen    HOME MEDICATIONS: Outpatient Prescriptions Prior to Visit  Medication Sig Dispense Refill  . amLODipine (NORVASC) 5 MG tablet     . aspirin 81 MG chewable tablet Chew 1 tablet (81 mg total) by mouth daily.    [name] atorvastatin (LIPITOR) 80 MG tablet Take 1 tablet (80 mg total) by mouth daily. 30 tablet 6  . escitalopram (LEXAPRO) 20 MG tablet Take 20 mg by mouth daily.    [name] HYDROcodone-acetaminophen (NORCO) 10-325 MG per tablet     . Linaclotide (LINZESS) 145 MCG CAPS capsule Take 1 capsule (145 mcg total) by mouth daily. 30 capsule 5  . lisinopril-hydrochlorothiazide (PRINZIDE,ZESTORETIC) 20-25 MG per tablet     . nitroGLYCERIN (NITROSTAT) 0.4 MG SL tablet Place 1 tablet (0.4 mg total) under the tongue every 5 (five) minutes x 3 doses as needed for chest pain. 25 tablet 3  . pantoprazole (PROTONIX) 40 MG tablet Take 1 tablet (40 mg total) by mouth 2 (two) times daily. 60 tablet 6  . promethazine (PHENERGAN) 25 MG tablet Take 25 mg by mouth 2 (two) times daily as needed for nausea or vomiting.    [name] tiZANidine (ZANAFLEX) 4 MG tablet Take 4 mg by mouth every 8 (eight) hours as needed for muscle spasms.    [name] triamcinolone lotion (KENALOG) 0.1 % Apply 1 application topically  as needed.      No facility-administered medications prior to visit.    PAST MEDICAL HISTORY: Past Medical History  Diagnosis Date  . 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 Seizures   . Essential hypertension, benign   . Mixed hyperlipidemia   . GERD (gastroesophageal reflux disease)   . Suicide attempt      3 attempts in remote past  . PUD (peptic ulcer disease)   . Coronary atherosclerosis of native coronary artery     DES to RCA 03/2012, DES to LAD at [facility] 05/2012  . Type 2 diabetes mellitus   . History of hematuria   . Bradycardia     PAST SURGICAL HISTORY: Past Surgical History  Procedure Laterality Date  . Abdominal hysterectomy      fibroids  . Bladder surgery    . Shoulder surgery      left  . Hand surgery      right  . Eye surgery      removed glass  . Left breast lumpectomy      benign  . Colonoscopy  May 2010    Fleishman: normal rectum, internal hemorrhoids, , benign colonic polyp  . Esophagogastroduodenoscopy      2001 Dr. Amedeo Plenty: distal esophagitis, small hiatal hernia,. Dr. Tamala Julian 2006? no records available currently, pt also reports  EGD a few years ago with Dr. Gala Romney, do not have these reports anywhere in medical records  . Esophagogastroduodenoscopy  06/02/2011    AST:MHDQQI pill impaction as described above s/p dilation of a probable cervical esophageal web/bx abnormal esophageal and gastric mucosa. + H.pylori gastritis   . Cardiac surgery  04/04/2012, 05/26/12    stent placement    FAMILY HISTORY: Family History  Problem Relation Age of Onset  . Colon cancer Paternal Grandfather   . Cancer Father   . Cancer Maternal Uncle   . Alzheimer's disease Paternal Aunt   . Cirrhosis Maternal Uncle   . Cancer Paternal Aunt     lung  . Heart disease Brother   . Heart attack Brother   . Mental illness Daughter   . ADD / ADHD Daughter   . Bipolar disorder Daughter   . Mental illness Son   . ADD / ADHD Son   . Bipolar disorder Son   . Mental illness Son   . ADD / ADHD Son   . Bipolar disorder Son     SOCIAL HISTORY:  History   Social History  . Marital Status: Single    Spouse Name: N/A    Number of Children: 3  . Years of Education: Assoc   Occupational History  . Retired     disability   Social History Main Topics  . Smoking status: Former  Smoker -- 0.20 packs/day for 32 years    Types: Cigarettes    Quit date: 04/03/2012  . Smokeless tobacco: Never Used  . Alcohol Use: No  . Drug Use: No  . Sexual Activity: Yes    Birth Control/ Protection: Surgical   Other Topics Concern  . Not on file   Social History Narrative   Patient lives at home with your fiance.   Caffeine Use: 3 cups daily     PHYSICAL EXAM  Filed Vitals:   02/13/14 1346  Temp: 97.9 F (36.6 C)  TempSrc: Oral  Height: 5\' 4"  (1.626 m)  Weight: 160 lb 6.4 oz (72.757 kg)    Not recorded  Visual Acuity Screening   Right eye Left eye Both eyes  Without correction:  20/200   With correction: 20/100       Body mass index is 27.52 kg/(m^2).  GENERAL EXAM: Patient is in no distress; well developed, nourished and groomed; neck is supple  CARDIOVASCULAR: Regular rate and rhythm, no murmurs, no carotid bruits  NEUROLOGIC: MENTAL STATUS: awake, alert, language fluent, comprehension intact, naming intact, fund of knowledge appropriate CRANIAL NERVE: no papilledema on fundoscopic exam, pupils equal and reactive to light, visual fields full to confrontation, extraocular muscles intact, no nystagmus, facial sensation and strength symmetric, hearing intact, palate elevates symmetrically, uvula midline, shoulder shrug symmetric, tongue midline. MOTOR: normal bulk and tone, full strength in the BUE, BLE SENSORY: normal and symmetric to light touch; ABSENT VIB AT KNEES, ANKLES, TOES COORDINATION: finger-nose-finger, fine finger movements normal REFLEXES: deep tendon reflexes present and symmetric GAIT/STATION: narrow based gait; able to walk tandem; romberg is negative    DIAGNOSTIC DATA (LABS, IMAGING, TESTING) - I reviewed patient records, labs, notes, testing and imaging myself where available.  Lab Results  Component Value Date   WBC 8.0 03/05/2013   HGB 12.0 03/05/2013   HCT 35.3* 03/05/2013   MCV 82.1 03/05/2013   PLT 288 03/05/2013       Component Value Date/Time   NA 139 04/04/2012 0530   K 3.6 04/04/2012 0530   CL 105 04/04/2012 0530   CO2 23 04/04/2012 0530   GLUCOSE 99 04/04/2012 0530   BUN 8 04/04/2012 0530   CREATININE 0.72 04/04/2012 0530   CALCIUM 8.9 04/04/2012 0530   PROT 6.4 05/21/2013 1155   ALBUMIN 3.8 05/21/2013 1155   AST 22 05/21/2013 1155   ALT 19 05/21/2013 1155   ALKPHOS 118* 05/21/2013 1155   BILITOT 0.4 05/21/2013 1155   GFRNONAA >90 04/04/2012 0530   GFRAA >90 04/04/2012 0530   Lab Results  Component Value Date   CHOL 186 05/21/2013   HDL 40 05/21/2013   LDLCALC 123* 05/21/2013   TRIG 115 05/21/2013   CHOLHDL 4.7 05/21/2013   Lab Results  Component Value Date   HGBA1C 5.5 04/03/2012   No results found for: VITAMINB12 No results found for: TSH  I reviewed images myself and agree with interpretation. -VRP  08/14/05 MRI brain - Normal brain. Tornwaldt cyst of the nasopharynx.    ASSESSMENT AND PLAN  48 y.o. year old female here with history of migraine, non-epileptic spells, now with new onset of multiple syncope events in last 2 months. These new events do not sound like seizure. Could be cardiogenic syncope or vertebrobasilar TIA.  Ddx of passing out spells: cardiogenic syncope, neurogenic syncope, vertebrobasilar insufficiency   PLAN:  Orders Placed This Encounter  Procedures  . MR Brain Wo Contrast  . MR MRA HEAD WO CONTRAST  . MR Angiogram Neck W Wo Contrast   Return in about 6 weeks (around 03/27/2014).    Penni Bombard, MD 88/11/9167, 4:50 PM Certified in Neurology, Neurophysiology and Neuroimaging  [facility] [address], [place], [place] [address] [phone]

## 2014-02-13 NOTE — Patient Instructions (Signed)
I will check MRI and MRA scans.  Follow up with cardiology about heart monitor.

## 2014-03-04 ENCOUNTER — Telehealth: Payer: Self-pay | Admitting: *Deleted

## 2014-03-04 NOTE — Telephone Encounter (Signed)
Patient is seeing neurologist and is having MRA of head.

## 2014-03-04 NOTE — Telephone Encounter (Signed)
Patient called to advise that her appointment on Wednesday should be postponed until she has worn the monitor that was ordered at last visit. Patient informed nurse that she is currently being evaluated by a neurologist and is scheduled to have a MRA head to evaluate syncope. While on phone with patient, she informed nurse that she is having problems with her heart racing and sometimes experience chest pain. MD informed and advised patient to come in for visit on Wednesday and it would be determined at that visit whether or not she still needs to wear the 30-day event monitor. Patient informed to keep appointment this Wednesday.

## 2014-03-06 ENCOUNTER — Telehealth: Payer: Self-pay | Admitting: Cardiology

## 2014-03-06 ENCOUNTER — Ambulatory Visit (INDEPENDENT_AMBULATORY_CARE_PROVIDER_SITE_OTHER): Payer: PRIVATE HEALTH INSURANCE | Admitting: Cardiology

## 2014-03-06 ENCOUNTER — Encounter: Payer: Self-pay | Admitting: Cardiology

## 2014-03-06 ENCOUNTER — Encounter: Payer: Self-pay | Admitting: *Deleted

## 2014-03-06 VITALS — BP 112/78 | HR 75 | Ht 64.0 in | Wt 155.0 lb

## 2014-03-06 DIAGNOSIS — R55 Syncope and collapse: Secondary | ICD-10-CM

## 2014-03-06 DIAGNOSIS — G450 Vertebro-basilar artery syndrome: Secondary | ICD-10-CM | POA: Insufficient documentation

## 2014-03-06 DIAGNOSIS — I2510 Atherosclerotic heart disease of native coronary artery without angina pectoris: Secondary | ICD-10-CM

## 2014-03-06 DIAGNOSIS — R072 Precordial pain: Secondary | ICD-10-CM

## 2014-03-06 DIAGNOSIS — I10 Essential (primary) hypertension: Secondary | ICD-10-CM

## 2014-03-06 NOTE — Assessment & Plan Note (Signed)
Possibly neurocardiogenic. For now we will hold off on cardiac monitoring.

## 2014-03-06 NOTE — Telephone Encounter (Signed)
Exercise Cardiolite on medications dx:CP & CAD Checking percert

## 2014-03-06 NOTE — Patient Instructions (Signed)
Your physician recommends that you continue on your current medications as directed. Please refer to the Current Medication list given to you today. Your physician has requested that you have en exercise stress myoview. For further information please visit [email or web address]. Please follow instruction sheet, as given. We will contact you with your results. Follow up will be based on stress test results.

## 2014-03-06 NOTE — Assessment & Plan Note (Signed)
She is reporting exertional angina symptoms over the last 3 months as discussed above. Plan is to continue medical therapy, we will arrange an exercise Cardiolite for follow-up ischemic evaluation.

## 2014-03-06 NOTE — Progress Notes (Signed)
Reason for visit: CAD, chest pain, follow-up syncope  Clinical Summary [name] is a 48 y.o.female recently seen in October. He have followed her with recent episodes of syncope that sound neurocardiogenic in description, otherwise without defined etiology. I did recommend a cardiac monitor, however she has not pursued this as yet. She has had interval evaluation by Neurology as well with further workup pending, although seizures doubted.  She presents today telling me that she has been experiencing exertional chest pain on inclines over the last 3 months. This is not exclusively associated with her syncope,-but she does feel weak and diaphoretic. She has not endorse the specific symptoms before with a irregularity. She reports at least moderate intensity chest pain, has to stop what she is doing sometimes. She otherwise reports compliance with her medications.  She has a history of DES to the RCA in December 2013 at [facility] followed by DES to the LAD at [facility] in February 2014 (prior patient of Dr. Dannielle Burn). Stress testing in January 2014 at [facility] did not indicate any focal ischemia. She has not had follow-up ischemic testing since that time.  Allergies  Allergen Reactions  . Penicillins Anaphylaxis    Patient states she carries an epi-pen    Current Outpatient Prescriptions  Medication Sig Dispense Refill  . amLODipine (NORVASC) 5 MG tablet     . aspirin 81 MG chewable tablet Chew 1 tablet (81 mg total) by mouth daily.    [name] atorvastatin (LIPITOR) 80 MG tablet Take 1 tablet (80 mg total) by mouth daily. 30 tablet 6  . escitalopram (LEXAPRO) 20 MG tablet Take 20 mg by mouth daily.    [name] HYDROcodone-acetaminophen (NORCO) 10-325 MG per tablet     . Linaclotide (LINZESS) 145 MCG CAPS capsule Take 1 capsule (145 mcg total) by mouth daily. 30 capsule 5  . lisinopril-hydrochlorothiazide (PRINZIDE,ZESTORETIC) 20-25 MG per tablet     . nitroGLYCERIN (NITROSTAT) 0.4 MG SL tablet Place 1  tablet (0.4 mg total) under the tongue every 5 (five) minutes x 3 doses as needed for chest pain. 25 tablet 3  . pantoprazole (PROTONIX) 40 MG tablet Take 1 tablet (40 mg total) by mouth 2 (two) times daily. 60 tablet 6  . promethazine (PHENERGAN) 25 MG tablet Take 25 mg by mouth 2 (two) times daily as needed for nausea or vomiting.    [name] tiZANidine (ZANAFLEX) 4 MG tablet Take 4 mg by mouth every 8 (eight) hours as needed for muscle spasms.    [name] triamcinolone lotion (KENALOG) 0.1 % Apply 1 application topically as needed.      No current facility-administered medications for this visit.    Past Medical History  Diagnosis Date  . 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 Seizures   . Essential hypertension, benign   . Mixed hyperlipidemia   . GERD (gastroesophageal reflux disease)   . Suicide attempt     3 attempts in remote past  . PUD (peptic ulcer disease)   . Coronary atherosclerosis of native coronary artery     DES to RCA 03/2012, DES to LAD at [facility] 05/2012  . Type 2 diabetes mellitus   . History of hematuria   . Bradycardia     Past Surgical History  Procedure Laterality Date  . Abdominal hysterectomy      fibroids  . Bladder surgery    . Shoulder surgery      left  . Hand surgery  right  . Eye surgery      removed glass  . Left breast lumpectomy      benign  . Colonoscopy  May 2010    Fleishman: normal rectum, internal hemorrhoids, , benign colonic polyp  . Esophagogastroduodenoscopy      2001 Dr. Amedeo Plenty: distal esophagitis, small hiatal hernia,. Dr. Tamala Julian 2006? no records available currently, pt also reports  EGD a few years ago with Dr. Gala Romney, do not have these reports anywhere in medical records  . Esophagogastroduodenoscopy  06/02/2011    OVF:IEPPIR pill impaction as described above s/p dilation of a probable cervical esophageal web/bx abnormal esophageal and gastric mucosa. + H.pylori gastritis     Social History [name]  reports that she quit smoking about 23 months ago. Her smoking use included Cigarettes. She started smoking about 30 years ago. She has a 6.4 pack-year smoking history. She has never used smokeless tobacco. [name] reports that she does not drink alcohol.  Review of Systems Complete review of systems negative except as otherwise outlined in the clinical summary and also the following. No specific palpitations. No focal motor weakness or tremors.  Physical Examination Filed Vitals:   03/06/14 1124  BP: 112/78  Pulse: 75   Filed Weights   03/06/14 1124  Weight: 155 lb (70.308 kg)    Appears comfortable. HEENT: Conjunctiva and lids normal, oropharynx clear.  Neck: Supple, no elevated JVP or carotid bruits, no thyromegaly.  Lungs: Clear to auscultation, nonlabored breathing at rest.  Cardiac: Regular rate and rhythm, no S3 or significant systolic murmur, no pericardial rub.  Abdomen: Soft, nontender, bowel sounds present.  Extremities: No pitting edema, distal pulses 2+.  Skin: Warm and dry.  Musculoskeletal: No kyphosis.  Neuropsychiatric: Alert and oriented x3, affect grossly appropriate.    Problem List and Plan   Coronary atherosclerosis of native coronary artery She is reporting exertional angina symptoms over the last 3 months as discussed above. Plan is to continue medical therapy, we will arrange an exercise Cardiolite for follow-up ischemic evaluation.  Syncope Possibly neurocardiogenic. For now we will hold off on cardiac monitoring.  Essential hypertension, benign Blood pressure is normal today.    Satira Sark, M.D., F.A.C.C.

## 2014-03-06 NOTE — Assessment & Plan Note (Signed)
Blood pressure is normal today. 

## 2014-03-08 NOTE — Telephone Encounter (Signed)
Approved and in epic °

## 2014-03-11 ENCOUNTER — Ambulatory Visit
Admission: RE | Admit: 2014-03-11 | Discharge: 2014-03-11 | Disposition: A | Discharge status: Home / Self Care | Payer: PRIVATE HEALTH INSURANCE | Source: Ambulatory Visit | Attending: Diagnostic Neuroimaging | Admitting: Diagnostic Neuroimaging

## 2014-03-11 DIAGNOSIS — G450 Vertebro-basilar artery syndrome: Secondary | ICD-10-CM

## 2014-03-11 DIAGNOSIS — R55 Syncope and collapse: Secondary | ICD-10-CM

## 2014-03-11 MED ORDER — GADOBENATE DIMEGLUMINE 529 MG/ML IV SOLN
15.0000 mL | Freq: Once | INTRAVENOUS | Status: AC | PRN
  Administered 2014-03-11: 15 mL via INTRAVENOUS

## 2014-03-15 ENCOUNTER — Encounter (HOSPITAL_COMMUNITY)
Admission: RE | Admit: 2014-03-15 | Discharge: 2014-03-15 | Disposition: A | Discharge status: Home / Self Care | Payer: PRIVATE HEALTH INSURANCE | Source: Ambulatory Visit | Attending: Cardiology | Admitting: Cardiology

## 2014-03-15 ENCOUNTER — Encounter (HOSPITAL_COMMUNITY): Payer: Self-pay

## 2014-03-15 ENCOUNTER — Ambulatory Visit (HOSPITAL_COMMUNITY)
Admission: RE | Admit: 2014-03-15 | Discharge: 2014-03-15 | Disposition: A | Discharge status: Home / Self Care | Payer: PRIVATE HEALTH INSURANCE | Source: Ambulatory Visit | Attending: Cardiology | Admitting: Cardiology

## 2014-03-15 DIAGNOSIS — E785 Hyperlipidemia, unspecified: Secondary | ICD-10-CM | POA: Diagnosis not present

## 2014-03-15 DIAGNOSIS — Z955 Presence of coronary angioplasty implant and graft: Secondary | ICD-10-CM | POA: Insufficient documentation

## 2014-03-15 DIAGNOSIS — I10 Essential (primary) hypertension: Secondary | ICD-10-CM | POA: Diagnosis not present

## 2014-03-15 DIAGNOSIS — I2510 Atherosclerotic heart disease of native coronary artery without angina pectoris: Secondary | ICD-10-CM

## 2014-03-15 DIAGNOSIS — R55 Syncope and collapse: Secondary | ICD-10-CM | POA: Diagnosis not present

## 2014-03-15 DIAGNOSIS — R072 Precordial pain: Secondary | ICD-10-CM

## 2014-03-15 DIAGNOSIS — R079 Chest pain, unspecified: Secondary | ICD-10-CM | POA: Insufficient documentation

## 2014-03-15 MED ORDER — SODIUM CHLORIDE 0.9 % IJ SOLN
10.0000 mL | INTRAMUSCULAR | Status: DC | PRN
  Administered 2014-03-15: 10 mL via INTRAVENOUS
  Filled 2014-03-15: qty 10

## 2014-03-15 MED ORDER — TECHNETIUM TC 99M SESTAMIBI - CARDIOLITE
10.0000 | Freq: Once | INTRAVENOUS | Status: AC | PRN
  Administered 2014-03-15: 10 via INTRAVENOUS

## 2014-03-15 MED ORDER — TECHNETIUM TC 99M SESTAMIBI GENERIC - CARDIOLITE
30.0000 | Freq: Once | INTRAVENOUS | Status: AC | PRN
  Administered 2014-03-15: 30 via INTRAVENOUS

## 2014-03-15 NOTE — Progress Notes (Signed)
Stress Lab Nurses Notes - [place] 03/15/2014 Reason for doing test: CAD, Chest Pain and Syncope Type of test: Stress Cardiolite Nurse performing test: Gerrit Halls, RN Nuclear Medicine Tech: Melburn Hake Echo Tech: Not Applicable MD performing test: McDowell/K.Purcell Nails NP Family MD: Manuella Ghazi Test explained and consent signed: Yes.   IV started: Saline lock flushed, No redness or edema and Saline lock started in radiology Symptoms: chest pain Treatment/Intervention: None Reason test stopped: protocol completed After recovery IV was: Discontinued via X-ray tech and No redness or edema Patient to return to Nuc. Med at : 10:00 Patient discharged: Home Patient's Condition upon discharge was: stable Comments: During test peak BP 158/83 & HR 151 . Recovery BP 110/75 & HR 80.  Symptoms resolved in recovery. Geanie Cooley T

## 2014-03-19 ENCOUNTER — Telehealth: Payer: Self-pay | Admitting: *Deleted

## 2014-03-19 NOTE — Telephone Encounter (Signed)
-----   Message from Satira Sark, MD sent at 03/17/2014  7:19 PM EST ----- Reviewed. Study showed no clear ischemia by perfusion imaging, ECG portion was intermediate risk. Continue medical therapy unless symptoms worsen - in that case we will pursue heart catheterization.

## 2014-03-19 NOTE — Telephone Encounter (Signed)
Patient informed. 

## 2014-03-27 ENCOUNTER — Ambulatory Visit (INDEPENDENT_AMBULATORY_CARE_PROVIDER_SITE_OTHER): Payer: PRIVATE HEALTH INSURANCE | Admitting: Diagnostic Neuroimaging

## 2014-03-27 ENCOUNTER — Encounter: Payer: Self-pay | Admitting: Diagnostic Neuroimaging

## 2014-03-27 VITALS — BP 130/86 | HR 63 | Ht 64.0 in | Wt 155.2 lb

## 2014-03-27 DIAGNOSIS — R55 Syncope and collapse: Secondary | ICD-10-CM

## 2014-03-27 NOTE — Patient Instructions (Signed)
Continue current medications. 

## 2014-03-27 NOTE — Progress Notes (Signed)
[facility]  PATIENT: Bethany Wang DOB: 07-Dec-1965  REFERRING CLINICIAN: Manuella Ghazi HISTORY FROM: patient and husband REASON FOR VISIT: new consult    HISTORICAL  CHIEF COMPLAINT:  Chief Complaint  Patient presents with  . Follow-up    HISTORY OF PRESENT ILLNESS:   UPDATE 02/13/14: Since last visit, patient here for multiple syncope events. Has had 3 events in the last 2 months. Symptoms start with neck pain, then feeling sweaty, hot sensation, sounds feel like they are in an echo chamber, and then patient collapsed to the ground. No convulsions. Patient is unconscious for 5-10 minutes at a time. She then is able to wake up, with energy drained feeling, sweaty hot feeling. No convulsions. No tongue biting or incontinence. Patient has had some double vision with some of these events.  PRIOR HPI (01/21/12): 48 year old right-handed female with history of hypertension, diabetes, here for evaluation of migraine. Patient reports history of intermittent severe headaches since childhood. Headaches typically start with nausea and vomiting, sensitivity sound, followed by severe frontal headaches. She's been on Friday medications over the years. Nowadays she has to severe migraine days per month. She's tried Topamax but this caused a rash. She's been on amitriptyline since that time to help with migraine control. She also has significant stress in her life with anger issues, and is on Cymbalta, venlafaxine and Depakote. With some of these severe headaches, patient has had episodes where she passes out, falls to the ground and shakes. To the emergency room for evaluation the past. She's also noted by another neurologist, who did EEG, and diagnosed her with nonepileptic spells.   REVIEW OF SYSTEMS: Full 14 system review of systems performed and notable only for weight loss fatigue swelling in legs ringing in ears spinning sensation birthmarks moles blurred vision double vision cough  wheezing anemia easy bruising feeling hot feeling cold increased thirst flushing joint pain joint swelling aching muscles headache numbness weakness insomnia sleepiness restless legs dizziness passing out depression anxiety not asleep decreased energy change.   ALLERGIES: Allergies  Allergen Reactions  . Penicillins Anaphylaxis    Patient states she carries an epi-pen    HOME MEDICATIONS: Outpatient Prescriptions Prior to Visit  Medication Sig Dispense Refill  . amLODipine (NORVASC) 5 MG tablet     . aspirin 81 MG chewable tablet Chew 1 tablet (81 mg total) by mouth daily.    [name] atorvastatin (LIPITOR) 80 MG tablet Take 1 tablet (80 mg total) by mouth daily. 30 tablet 6  . escitalopram (LEXAPRO) 20 MG tablet Take 20 mg by mouth daily.    [name] HYDROcodone-acetaminophen (NORCO) 10-325 MG per tablet     . Linaclotide (LINZESS) 145 MCG CAPS capsule Take 1 capsule (145 mcg total) by mouth daily. 30 capsule 5  . lisinopril-hydrochlorothiazide (PRINZIDE,ZESTORETIC) 20-25 MG per tablet     . nitroGLYCERIN (NITROSTAT) 0.4 MG SL tablet Place 1 tablet (0.4 mg total) under the tongue every 5 (five) minutes x 3 doses as needed for chest pain. 25 tablet 3  . pantoprazole (PROTONIX) 40 MG tablet Take 1 tablet (40 mg total) by mouth 2 (two) times daily. 60 tablet 6  . promethazine (PHENERGAN) 25 MG tablet Take 25 mg by mouth 2 (two) times daily as needed for nausea or vomiting.    [name] tiZANidine (ZANAFLEX) 4 MG tablet Take 4 mg by mouth every 8 (eight) hours as needed for muscle spasms.    [name] triamcinolone lotion (KENALOG) 0.1 % Apply 1 application topically as needed.  No facility-administered medications prior to visit.    PAST MEDICAL HISTORY: Past Medical History  Diagnosis Date  . 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 Seizures   . Essential hypertension, benign   . Mixed hyperlipidemia   . GERD (gastroesophageal reflux disease)   . Suicide attempt     3 attempts  in remote past  . PUD (peptic ulcer disease)   . Coronary atherosclerosis of native coronary artery     DES to RCA 03/2012, DES to LAD at [facility] 05/2012  . Type 2 diabetes mellitus   . History of hematuria   . Bradycardia   . Cancer     Right Breast, Uterine    PAST SURGICAL HISTORY: Past Surgical History  Procedure Laterality Date  . Abdominal hysterectomy      fibroids  . Bladder surgery    . Shoulder surgery      left  . Hand surgery      right  . Eye surgery      removed glass  . Left breast lumpectomy      benign  . Colonoscopy  May 2010    Fleishman: normal rectum, internal hemorrhoids, , benign colonic polyp  . Esophagogastroduodenoscopy      2001 Dr. Amedeo Plenty: distal esophagitis, small hiatal hernia,. Dr. Tamala Julian 2006? no records available currently, pt also reports  EGD a few years ago with Dr. Gala Romney, do not have these reports anywhere in medical records  . Esophagogastroduodenoscopy  06/02/2011    QVZ:DGLOVF pill impaction as described above s/p dilation of a probable cervical esophageal web/bx abnormal esophageal and gastric mucosa. + H.pylori gastritis     FAMILY HISTORY: Family History  Problem Relation Age of Onset  . Colon cancer Paternal Grandfather   . Cancer Father   . Cancer Maternal Uncle   . Alzheimer's disease Paternal Aunt   . Cirrhosis Maternal Uncle   . Cancer Paternal Aunt     lung  . Heart disease Brother   . Heart attack Brother   . Mental illness Daughter   . ADD / ADHD Daughter   . Bipolar disorder Daughter   . Mental illness Son   . ADD / ADHD Son   . Bipolar disorder Son   . Mental illness Son   . ADD / ADHD Son   . Bipolar disorder Son     SOCIAL HISTORY:  History   Social History  . Marital Status: Single    Spouse Name: N/A    Number of Children: 3  . Years of Education: Assoc   Occupational History  . Retired     disability   Social History Main Topics  . Smoking status: Former Smoker -- 0.20 packs/day for 32 years     Types: Cigarettes    Start date: 07/05/1983    Quit date: 04/03/2012  . Smokeless tobacco: Never Used  . Alcohol Use: No  . Drug Use: No  . Sexual Activity: Yes    Birth Control/ Protection: Surgical   Other Topics Concern  . Not on file   Social History Narrative   Patient lives at home with your fiance.   Caffeine Use: 3 cups daily     PHYSICAL EXAM  Filed Vitals:   03/27/14 1343  BP: 130/86  Pulse: 63  Height: 5\' 4"  (1.626 m)  Weight: 155 lb 3.2 oz (70.398 kg)    Not recorded     No exam data present  Body mass index is 26.63 kg/(m^2).  GENERAL EXAM: Patient is in no distress; well developed, nourished and groomed; neck is supple  CARDIOVASCULAR: Regular rate and rhythm, no murmurs, no carotid bruits  NEUROLOGIC: MENTAL STATUS: awake, alert, language fluent, comprehension intact, naming intact, fund of knowledge appropriate CRANIAL NERVE: no papilledema on fundoscopic exam, pupils equal and reactive to light, visual fields full to confrontation, extraocular muscles intact, no nystagmus, facial sensation and strength symmetric, hearing intact, palate elevates symmetrically, uvula midline, shoulder shrug symmetric, tongue midline. MOTOR: normal bulk and tone, full strength in the BUE, BLE SENSORY: normal and symmetric to light touch; ABSENT VIB AT KNEES, ANKLES, TOES COORDINATION: finger-nose-finger, fine finger movements normal REFLEXES: deep tendon reflexes present and symmetric GAIT/STATION: narrow based gait; able to walk tandem; romberg is negative    DIAGNOSTIC DATA (LABS, IMAGING, TESTING) - I reviewed patient records, labs, notes, testing and imaging myself where available.  Lab Results  Component Value Date   WBC 8.0 03/05/2013   HGB 12.0 03/05/2013   HCT 35.3* 03/05/2013   MCV 82.1 03/05/2013   PLT 288 03/05/2013      Component Value Date/Time   NA 139 04/04/2012 0530   K 3.6 04/04/2012 0530   CL 105 04/04/2012 0530   CO2 23 04/04/2012  0530   GLUCOSE 99 04/04/2012 0530   BUN 8 04/04/2012 0530   CREATININE 0.72 04/04/2012 0530   CALCIUM 8.9 04/04/2012 0530   PROT 6.4 05/21/2013 1155   ALBUMIN 3.8 05/21/2013 1155   AST 22 05/21/2013 1155   ALT 19 05/21/2013 1155   ALKPHOS 118* 05/21/2013 1155   BILITOT 0.4 05/21/2013 1155   GFRNONAA >90 04/04/2012 0530   GFRAA >90 04/04/2012 0530   Lab Results  Component Value Date   CHOL 186 05/21/2013   HDL 40 05/21/2013   LDLCALC 123* 05/21/2013   TRIG 115 05/21/2013   CHOLHDL 4.7 05/21/2013   Lab Results  Component Value Date   HGBA1C 5.5 04/03/2012   No results found for: VITAMINB12 No results found for: TSH  I reviewed images myself and agree with interpretation. -VRP  08/14/05 MRI brain - Normal brain. Tornwaldt cyst of the nasopharynx.   03/11/14 MRI brain (without) demonstrating: 1. Minimal periventricular and subcortical foci of non-specific gliosis. Could be related to chronic small vessel ischemic disease. 2. No acute findings.  03/11/14 MRA head - normal  03/11/14 MRA neck (with and without): 1. The right vertebral artery is slightly irregular proximally with 30% focal stenosis; however there is robust flow signal superiorly up to the vertebrobasilar junction. 2. The left vertebral artery is slightly irregular proximally without focal stenosis; there is robust flow signal superiorly up to the vertebrobasilar junction.  3. The left internal carotid artery has 20% stenosis at the carotid bulb due to smooth plaque.     ASSESSMENT AND PLAN  48 y.o. year old female here with history of migraine, non-epileptic spells, now with new onset of multiple syncope events in last 2 months. These new events do not sound like seizure. Could be cardiogenic syncope or vertebrobasilar TIA. MRI brain and MRA head unremarkable. She does have some atherosclerosis in the neck (right verterbal 30%, left vertebral irregular, left ICA 20%), but I do not this she is symptomatic  from these, and I would recommend medical management of risk factors.  Ddx of passing out spells: cardiogenic syncope, neurogenic syncope, vertebrobasilar insufficiency  PLAN: - continue smoking abstinence - continue aspirin - continue BP control  Return  if symptoms worsen or fail to improve, for return to PCP.    Penni Bombard, MD 90/24/0973, 5:32 PM Certified in Neurology, Neurophysiology and Neuroimaging  [facility] [address], [place], [place] [address] [phone]

## 2014-04-25 ENCOUNTER — Encounter (HOSPITAL_COMMUNITY): Payer: Self-pay | Admitting: Cardiology

## 2014-07-02 ENCOUNTER — Ambulatory Visit (INDEPENDENT_AMBULATORY_CARE_PROVIDER_SITE_OTHER): Payer: Medicare Other | Admitting: Cardiology

## 2014-07-02 ENCOUNTER — Encounter: Payer: Self-pay | Admitting: Cardiology

## 2014-07-02 ENCOUNTER — Other Ambulatory Visit: Payer: Self-pay | Admitting: Cardiology

## 2014-07-02 ENCOUNTER — Ambulatory Visit (HOSPITAL_COMMUNITY)
Admission: RE | Admit: 2014-07-02 | Discharge: 2014-07-02 | Disposition: A | Discharge status: Home / Self Care | Payer: Medicare Other | Source: Ambulatory Visit | Attending: Cardiology | Admitting: Cardiology

## 2014-07-02 VITALS — BP 126/82 | HR 80 | Ht 65.0 in | Wt 154.0 lb

## 2014-07-02 DIAGNOSIS — E119 Type 2 diabetes mellitus without complications: Secondary | ICD-10-CM | POA: Diagnosis not present

## 2014-07-02 DIAGNOSIS — Z853 Personal history of malignant neoplasm of breast: Secondary | ICD-10-CM | POA: Diagnosis not present

## 2014-07-02 DIAGNOSIS — Z87891 Personal history of nicotine dependence: Secondary | ICD-10-CM | POA: Diagnosis not present

## 2014-07-02 DIAGNOSIS — Z8249 Family history of ischemic heart disease and other diseases of the circulatory system: Secondary | ICD-10-CM | POA: Diagnosis not present

## 2014-07-02 DIAGNOSIS — Z7952 Long term (current) use of systemic steroids: Secondary | ICD-10-CM | POA: Diagnosis not present

## 2014-07-02 DIAGNOSIS — Z79891 Long term (current) use of opiate analgesic: Secondary | ICD-10-CM | POA: Diagnosis not present

## 2014-07-02 DIAGNOSIS — I10 Essential (primary) hypertension: Secondary | ICD-10-CM | POA: Diagnosis not present

## 2014-07-02 DIAGNOSIS — I200 Unstable angina: Secondary | ICD-10-CM | POA: Diagnosis not present

## 2014-07-02 DIAGNOSIS — I25118 Atherosclerotic heart disease of native coronary artery with other forms of angina pectoris: Secondary | ICD-10-CM | POA: Diagnosis not present

## 2014-07-02 DIAGNOSIS — Z01811 Encounter for preprocedural respiratory examination: Secondary | ICD-10-CM

## 2014-07-02 DIAGNOSIS — Z79899 Other long term (current) drug therapy: Secondary | ICD-10-CM | POA: Diagnosis not present

## 2014-07-02 DIAGNOSIS — I2582 Chronic total occlusion of coronary artery: Secondary | ICD-10-CM | POA: Diagnosis not present

## 2014-07-02 DIAGNOSIS — Z8711 Personal history of peptic ulcer disease: Secondary | ICD-10-CM | POA: Diagnosis not present

## 2014-07-02 DIAGNOSIS — I25119 Atherosclerotic heart disease of native coronary artery with unspecified angina pectoris: Secondary | ICD-10-CM

## 2014-07-02 DIAGNOSIS — Z01818 Encounter for other preprocedural examination: Secondary | ICD-10-CM | POA: Insufficient documentation

## 2014-07-02 DIAGNOSIS — I208 Other forms of angina pectoris: Secondary | ICD-10-CM | POA: Diagnosis present

## 2014-07-02 DIAGNOSIS — K219 Gastro-esophageal reflux disease without esophagitis: Secondary | ICD-10-CM | POA: Diagnosis not present

## 2014-07-02 DIAGNOSIS — E782 Mixed hyperlipidemia: Secondary | ICD-10-CM | POA: Diagnosis not present

## 2014-07-02 DIAGNOSIS — Z7982 Long term (current) use of aspirin: Secondary | ICD-10-CM | POA: Diagnosis not present

## 2014-07-02 DIAGNOSIS — R079 Chest pain, unspecified: Secondary | ICD-10-CM

## 2014-07-02 NOTE — Progress Notes (Signed)
Cardiology Office Note  Date: 07/02/2014   ID: SAMHITHA ROSEN, DOB [birth year], MRN [id number]  PCP: Monico Blitz, MD  Primary Cardiologist: [name], MD   Chief Complaint  Patient presents with  . Coronary Artery Disease  . Chest Pain    History of Present Illness: Bethany Wang is a 49 y.o. female last seen in November 2015. She is here with her husband today for a follow-up visit. She tells me that over the last 3 weeks she has been experiencing recurring chest heaviness, typically in the evenings when she is sitting on her couch watching television. She becomes diaphoretic and short of breath, has to take deep breaths and uses nitroglycerin, eventually with relief of symptoms in 10-15 minutes. This does not specifically occur with exertion during the daytime. She is very frightened by these symptoms, concerned about her heart. She recently saw her primary care provider Dr. Manuella Ghazi, and he referred her back to see me.  She was referred for a follow-up exercise Cardiolite after her last visit in November 2015 for repeat ischemic evaluation, results noted below. She has a history of DES to the RCA in December 2013 at [facility] followed by DES to the LAD at [facility] in February 2014 (prior patient of Dr. Dannielle Burn).  We reviewed her medications, she reports compliance, states that she is trying to follow a diet and keep her weight stable. She does not endorse any reflux symptoms or dysphagia. No cough, fevers or chills.   Past Medical History  Diagnosis Date  . 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 Seizures   . Essential hypertension, benign   . Mixed hyperlipidemia   . GERD (gastroesophageal reflux disease)   . Suicide attempt     3 attempts in remote past  . PUD (peptic ulcer disease)   . Coronary atherosclerosis of native coronary artery     DES to RCA 03/2012, DES to LAD at [facility] 05/2012  . Type 2 diabetes mellitus   . History of hematuria   .  Bradycardia   . Cancer     Right Breast, Uterine    Past Surgical History  Procedure Laterality Date  . Abdominal hysterectomy    . Bladder surgery    . Shoulder surgery Left   . Hand surgery Right   . Eye surgery      Removed glass  . Left breast lumpectomy      Benign  . Colonoscopy  May 2010    [facility]: normal rectum, internal hemorrhoids, , benign colonic polyp  . Esophagogastroduodenoscopy      2001 Dr. Amedeo Plenty: distal esophagitis, small hiatal hernia,. Dr. Tamala Julian 2006? no records available currently, pt also reports  EGD a few years ago with Dr. Gala Romney, do not have these reports anywhere in medical records  . Esophagogastroduodenoscopy  06/02/2011    GYB:WLSLHT pill impaction as described above s/p dilation of a probable cervical esophageal web/bx abnormal esophageal and gastric mucosa. + H.pylori gastritis     Current Outpatient Prescriptions  Medication Sig Dispense Refill  . amLODipine (NORVASC) 5 MG tablet     . aspirin 81 MG chewable tablet Chew 1 tablet (81 mg total) by mouth daily.    [name] atorvastatin (LIPITOR) 80 MG tablet Take 1 tablet (80 mg total) by mouth daily. 30 tablet 6  . escitalopram (LEXAPRO) 20 MG tablet Take 20 mg by mouth daily.    [name] gabapentin (NEURONTIN) 400 MG capsule Take  1 capsule by mouth daily.    [name] HYDROcodone-acetaminophen (NORCO) 10-325 MG per tablet     . Linaclotide (LINZESS) 145 MCG CAPS capsule Take 1 capsule (145 mcg total) by mouth daily. 30 capsule 5  . lisinopril-hydrochlorothiazide (PRINZIDE,ZESTORETIC) 20-25 MG per tablet     . nitroGLYCERIN (NITROSTAT) 0.4 MG SL tablet Place 1 tablet (0.4 mg total) under the tongue every 5 (five) minutes x 3 doses as needed for chest pain. 25 tablet 3  . pantoprazole (PROTONIX) 40 MG tablet Take 1 tablet (40 mg total) by mouth 2 (two) times daily. 60 tablet 6  . tiZANidine (ZANAFLEX) 4 MG tablet Take 4 mg by mouth every 8 (eight) hours as needed for muscle spasms.    [name] triamcinolone lotion (KENALOG) 0.1  % Apply 1 application topically as needed.      No current facility-administered medications for this visit.    Allergies:  Penicillins   Social History: The patient  reports that she quit smoking about 2 years ago. Her smoking use included Cigarettes. She started smoking about 31 years ago. She has a 6.4 pack-year smoking history. She has never used smokeless tobacco. She reports that she does not drink alcohol or use illicit drugs.   Family History: The patient's family history includes ADD / ADHD in her daughter, son, and son; Alzheimer's disease in her paternal aunt; Bipolar disorder in her daughter, son, and son; Cancer in her father, maternal uncle, and paternal aunt; Cirrhosis in her maternal uncle; Colon cancer in her paternal grandfather; Heart attack in her brother; Heart disease in her brother; Mental illness in her daughter, son, and son.   ROS:  Please see the history of present illness. Otherwise, complete review of systems is positive for none.  All other systems are reviewed and negative.    Physical Exam: VS:  BP 126/82 mmHg  Pulse 80  Ht 5\' 5"  (1.651 m)  Wt 154 lb (69.854 kg)  BMI 25.63 kg/m2  SpO2 98%, BMI Body mass index is 25.63 kg/(m^2).  Wt Readings from Last 3 Encounters:  07/02/14 154 lb (69.854 kg)  03/27/14 155 lb 3.2 oz (70.398 kg)  03/06/14 155 lb (70.308 kg)     Appears comfortable. HEENT: Conjunctiva and lids normal, oropharynx clear.  Neck: Supple, no elevated JVP or carotid bruits, no thyromegaly.  Lungs: Clear to auscultation, nonlabored breathing at rest.  Cardiac: Regular rate and rhythm, no S3 or significant systolic murmur, no pericardial rub.  Abdomen: Soft, nontender, bowel sounds present.  Extremities: No pitting edema, distal pulses 2+.  Skin: Warm and dry.  Musculoskeletal: No kyphosis.  Neuropsychiatric: Alert and oriented x3, affect grossly appropriate.    ECG: ECG is ordered today and reviewed showing sinus rhythm with  decreased anteroseptal R-wave progression.  Recent Labwork: Lab work from October 2015 showed BUN 10, creatinine 0.8, AST 21, ALT 17, potassium 3.9, hemoglobin 12.8, platelets 228.      Component Value Date/Time   CHOL 186 05/21/2013 1155   TRIG 115 05/21/2013 1155   HDL 40 05/21/2013 1155   CHOLHDL 4.7 05/21/2013 1155   VLDL 23 05/21/2013 1155   LDLCALC 123* 05/21/2013 1155    Other Studies Reviewed Today:  Exercise Cardiolite 03/15/2014: MYOCARDIAL IMAGING WITH SPECT (REST AND EXERCISE)  GATED LEFT VENTRICULAR WALL MOTION STUDY  LEFT VENTRICULAR EJECTION FRACTION  TECHNIQUE: Standard myocardial SPECT imaging was performed after resting intravenous injection of 10 mCi Tc-23m sestamibi. Subsequently, exercise tolerance test was performed by the patient under the  supervision of the Cardiology staff. At peak-stress, 30 mCi Tc-75m sestamibi was injected intravenously and standard myocardial SPECT imaging was performed. Quantitative gated imaging was also performed to evaluate left ventricular wall motion, and estimate left ventricular ejection fraction.  FINDINGS: Baseline tracing shows sinus rhythm at 60 beats per min. Patient was exercised on a Bruce protocol for 9 min and 8 seconds achieving a maximum work load of 10.1 METS. Peak heart rate was 153 beats per min which was 88% of the maximal age predicted heart rate. Peak blood pressure was 158/83. She reported non limiting chest pain during the study. ST segment depression of 1/2 to 1 mm noted in leads II, III, and aVF. There were no arrhythmias. Duke treadmill score intermediate risk at -3.  Analysis of the overall perfusion data finds mild breast attenuation.  Perfusion: Small, mild intensity, anteroseptal defect from apex to base that is fixed and consistent with soft tissue attenuation. There is also a small, mild intensity, inferior defect from apex to base that is most prominent at rest imaging and  consistent with soft tissue attenuation.  Wall Motion: Normal left ventricular wall motion. No left ventricular dilation.  Left Ventricular Ejection Fraction: 67 %  End diastolic volume 89 ml  End systolic volume 30 ml  IMPRESSION: 1. Evidence of soft tissue attenuation in the anteroseptal and inferior distribution, no definite ischemia.  2. Normal left ventricular wall motion.  3. Left ventricular ejection fraction 67%  4. Overall low-risk stress test findings* based on perfusion imaging. Duke treadmill score was intermediate risk at -3. Correlate clinically.   ASSESSMENT AND PLAN:  1. Recurring chest discomfort in progressive pattern over the last few weeks, typical and atypical features, rule out accelerating angina. Patient has history of DES to the RCA in 2013 and DES to the LAD in 2014, ischemic workup done in December of this past year is noted above. Symptoms have been progressing more recently. ECG showed today shows no acute ST segment changes. We have discussed proceeding to a cardiac catheterization to clearly reevaluate her coronary anatomy and assess stent patency. Procedure is being scheduled for tomorrow at 1 PM with Dr. Tamala Julian, lab work to be obtained in the morning, chest x-ray today.  2. CAD status post DES to the RCA in December 2013 and DES to the LAD in February 2014.  3. Essential hypertension, blood pressure well controlled today.  4. Hyperlipidemia, on Lipitor.  Current medicines are reviewed with the patient today.     Orders Placed This Encounter  Procedures  . DG Chest 2 View  . EKG 12-Lead    Disposition: FU with me in 3 weeks.   Signed, Satira Sark, MD, [facility] 07/02/2014 10:39 AM    [place] [facility] [place]. [address] [place], [place] [address] Phone: [phone]; Fax: [phone]

## 2014-07-02 NOTE — Patient Instructions (Signed)
Your physician recommends that you schedule a follow-up appointment in: after your heart cath they will schedule follow up   PLEASE get chest x-ray today   Your physician has requested that you have a cardiac catheterization tomorrow at [facility] at 1 pm.Arrive at 11 am. Cardiac catheterization is used to diagnose and/or treat various heart conditions. Doctors may recommend this procedure for a number of different reasons. The most common reason is to evaluate chest pain. Chest pain can be a symptom of coronary artery disease (CAD), and cardiac catheterization can show whether plaque is narrowing or blocking your heart's arteries. This procedure is also used to evaluate the valves, as well as measure the blood flow and oxygen levels in different parts of your heart. For further information please visit [email or web address]. Please follow instruction sheet, as given.    Thank you for choosing [facility] !

## 2014-07-03 ENCOUNTER — Encounter (HOSPITAL_COMMUNITY): Payer: Self-pay | Admitting: Interventional Cardiology

## 2014-07-03 ENCOUNTER — Ambulatory Visit (HOSPITAL_COMMUNITY)
Admission: RE | Admit: 2014-07-03 | Discharge: 2014-07-03 | Disposition: A | Discharge status: Home / Self Care | Payer: Medicare Other | Source: Ambulatory Visit | Attending: Interventional Cardiology | Admitting: Interventional Cardiology

## 2014-07-03 ENCOUNTER — Encounter (HOSPITAL_COMMUNITY)
Admission: RE | Disposition: A | Discharge status: Home / Self Care | Payer: Self-pay | Source: Ambulatory Visit | Attending: Interventional Cardiology

## 2014-07-03 DIAGNOSIS — I2582 Chronic total occlusion of coronary artery: Secondary | ICD-10-CM | POA: Insufficient documentation

## 2014-07-03 DIAGNOSIS — I25119 Atherosclerotic heart disease of native coronary artery with unspecified angina pectoris: Secondary | ICD-10-CM | POA: Diagnosis present

## 2014-07-03 DIAGNOSIS — Z7982 Long term (current) use of aspirin: Secondary | ICD-10-CM | POA: Insufficient documentation

## 2014-07-03 DIAGNOSIS — E119 Type 2 diabetes mellitus without complications: Secondary | ICD-10-CM | POA: Insufficient documentation

## 2014-07-03 DIAGNOSIS — Z7952 Long term (current) use of systemic steroids: Secondary | ICD-10-CM | POA: Insufficient documentation

## 2014-07-03 DIAGNOSIS — I208 Other forms of angina pectoris: Secondary | ICD-10-CM

## 2014-07-03 DIAGNOSIS — K219 Gastro-esophageal reflux disease without esophagitis: Secondary | ICD-10-CM | POA: Insufficient documentation

## 2014-07-03 DIAGNOSIS — Z87891 Personal history of nicotine dependence: Secondary | ICD-10-CM | POA: Insufficient documentation

## 2014-07-03 DIAGNOSIS — Z853 Personal history of malignant neoplasm of breast: Secondary | ICD-10-CM | POA: Insufficient documentation

## 2014-07-03 DIAGNOSIS — I200 Unstable angina: Secondary | ICD-10-CM

## 2014-07-03 DIAGNOSIS — I25118 Atherosclerotic heart disease of native coronary artery with other forms of angina pectoris: Secondary | ICD-10-CM | POA: Insufficient documentation

## 2014-07-03 DIAGNOSIS — E782 Mixed hyperlipidemia: Secondary | ICD-10-CM | POA: Insufficient documentation

## 2014-07-03 DIAGNOSIS — Z8711 Personal history of peptic ulcer disease: Secondary | ICD-10-CM | POA: Insufficient documentation

## 2014-07-03 DIAGNOSIS — Z720 Tobacco use: Secondary | ICD-10-CM | POA: Diagnosis present

## 2014-07-03 DIAGNOSIS — Z79891 Long term (current) use of opiate analgesic: Secondary | ICD-10-CM | POA: Insufficient documentation

## 2014-07-03 DIAGNOSIS — Z79899 Other long term (current) drug therapy: Secondary | ICD-10-CM | POA: Insufficient documentation

## 2014-07-03 DIAGNOSIS — I2089 Other forms of angina pectoris: Secondary | ICD-10-CM

## 2014-07-03 DIAGNOSIS — I10 Essential (primary) hypertension: Secondary | ICD-10-CM | POA: Diagnosis present

## 2014-07-03 DIAGNOSIS — I1A0 Resistant hypertension: Secondary | ICD-10-CM | POA: Diagnosis present

## 2014-07-03 DIAGNOSIS — I2510 Atherosclerotic heart disease of native coronary artery without angina pectoris: Secondary | ICD-10-CM | POA: Diagnosis present

## 2014-07-03 DIAGNOSIS — Z8249 Family history of ischemic heart disease and other diseases of the circulatory system: Secondary | ICD-10-CM | POA: Insufficient documentation

## 2014-07-03 HISTORY — PX: LEFT HEART CATHETERIZATION WITH CORONARY ANGIOGRAM: SHX5451

## 2014-07-03 LAB — CBC
HCT: 38.9 % (ref 36.0–46.0)
Hemoglobin: 13 g/dL (ref 12.0–15.0)
MCH: 28.6 pg (ref 26.0–34.0)
MCHC: 33.4 g/dL (ref 30.0–36.0)
MCV: 85.7 fL (ref 78.0–100.0)
Platelets: 245 10*3/uL (ref 150–400)
RBC: 4.54 MIL/uL (ref 3.87–5.11)
RDW: 14.1 % (ref 11.5–15.5)
WBC: 6.4 10*3/uL (ref 4.0–10.5)

## 2014-07-03 LAB — BASIC METABOLIC PANEL
Anion gap: 6 (ref 5–15)
BUN: 6 mg/dL (ref 6–23)
CALCIUM: 9.8 mg/dL (ref 8.4–10.5)
CO2: 32 mmol/L (ref 19–32)
Chloride: 102 mmol/L (ref 96–112)
Creatinine, Ser: 0.76 mg/dL (ref 0.50–1.10)
GFR calc non Af Amer: >90 mL/min (ref >90)
GLUCOSE: 102 mg/dL — AB (ref 70–99)
Potassium: 3.6 mmol/L (ref 3.5–5.1)
Sodium: 140 mmol/L (ref 135–145)

## 2014-07-03 LAB — PROTIME-INR
INR: 0.99 (ref 0.00–1.49)
Prothrombin Time: 13.2 seconds (ref 11.6–15.2)

## 2014-07-03 LAB — GLUCOSE, CAPILLARY
Glucose-Capillary: 96 mg/dL (ref 70–99)
Glucose-Capillary: 84 mg/dL (ref 70–99)

## 2014-07-03 SURGERY — LEFT HEART CATHETERIZATION WITH CORONARY ANGIOGRAM

## 2014-07-03 MED ORDER — LIDOCAINE HCL (PF) 1 % IJ SOLN
INTRAMUSCULAR | Status: AC
  Filled 2014-07-03: qty 30

## 2014-07-03 MED ORDER — ASPIRIN 81 MG PO CHEW
CHEWABLE_TABLET | ORAL | Status: AC
  Filled 2014-07-03: qty 1

## 2014-07-03 MED ORDER — SODIUM CHLORIDE 0.9 % IV SOLN
INTRAVENOUS | Status: DC
  Administered 2014-07-03: 12:00:00 via INTRAVENOUS

## 2014-07-03 MED ORDER — HEPARIN (PORCINE) IN NACL 2-0.9 UNIT/ML-% IJ SOLN
INTRAMUSCULAR | Status: AC
  Filled 2014-07-03: qty 1000

## 2014-07-03 MED ORDER — ONDANSETRON HCL 4 MG/2ML IJ SOLN: 4.0000 mg | Freq: Four times a day (QID) | INTRAMUSCULAR | Status: DC | PRN

## 2014-07-03 MED ORDER — ASPIRIN 81 MG PO CHEW: 81.0000 mg | CHEWABLE_TABLET | ORAL | Status: DC

## 2014-07-03 MED ORDER — NITROGLYCERIN 1 MG/10 ML FOR IR/CATH LAB
INTRA_ARTERIAL | Status: AC
  Filled 2014-07-03: qty 10

## 2014-07-03 MED ORDER — SODIUM CHLORIDE 0.9 % IJ SOLN: 3.0000 mL | INTRAMUSCULAR | Status: DC | PRN

## 2014-07-03 MED ORDER — SODIUM CHLORIDE 0.9 % IV SOLN: 1.0000 mL/kg/h | INTRAVENOUS | Status: DC

## 2014-07-03 MED ORDER — SODIUM CHLORIDE 0.9 % IJ SOLN: 3.0000 mL | Freq: Two times a day (BID) | INTRAMUSCULAR | Status: DC

## 2014-07-03 MED ORDER — ACETAMINOPHEN 325 MG PO TABS: 650.0000 mg | ORAL_TABLET | ORAL | Status: DC | PRN

## 2014-07-03 MED ORDER — SODIUM CHLORIDE 0.9 % IV SOLN: 250.0000 mL | INTRAVENOUS | Status: DC | PRN

## 2014-07-03 MED ORDER — FENTANYL CITRATE 0.05 MG/ML IJ SOLN
INTRAMUSCULAR | Status: AC
  Filled 2014-07-03: qty 2

## 2014-07-03 MED ORDER — ISOSORBIDE MONONITRATE ER 30 MG PO TB24: 30.0000 mg | ORAL_TABLET | Freq: Every day | ORAL | Status: DC

## 2014-07-03 MED ORDER — MIDAZOLAM HCL 2 MG/2ML IJ SOLN
INTRAMUSCULAR | Status: AC
  Filled 2014-07-03: qty 2

## 2014-07-03 NOTE — H&P (View-Only) (Signed)
Cardiology Office Note  Date: 07/02/2014   ID: SHANIE MAUZY, DOB 1965/12/03, MRN [id number]  PCP: Monico Blitz, MD  Primary Cardiologist: [name], MD   Chief Complaint  Patient presents with  . Coronary Artery Disease  . Chest Pain    History of Present Illness: Bethany Wang is a 49 y.o. female last seen in November 2015. She is here with her husband today for a follow-up visit. She tells me that over the last 3 weeks she has been experiencing recurring chest heaviness, typically in the evenings when she is sitting on her couch watching television. She becomes diaphoretic and short of breath, has to take deep breaths and uses nitroglycerin, eventually with relief of symptoms in 10-15 minutes. This does not specifically occur with exertion during the daytime. She is very frightened by these symptoms, concerned about her heart. She recently saw her primary care provider Dr. Manuella Ghazi, and he referred her back to see me.  She was referred for a follow-up exercise Cardiolite after her last visit in November 2015 for repeat ischemic evaluation, results noted below. She has a history of DES to the RCA in December 2013 at [facility] followed by DES to the LAD at [facility] in February 2014 (prior patient of Dr. Dannielle Burn).  We reviewed her medications, she reports compliance, states that she is trying to follow a diet and keep her weight stable. She does not endorse any reflux symptoms or dysphagia. No cough, fevers or chills.   Past Medical History  Diagnosis Date  . 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 Seizures   . Essential hypertension, benign   . Mixed hyperlipidemia   . GERD (gastroesophageal reflux disease)   . Suicide attempt     3 attempts in remote past  . PUD (peptic ulcer disease)   . Coronary atherosclerosis of native coronary artery     DES to RCA 03/2012, DES to LAD at [facility] 05/2012  . Type 2 diabetes mellitus   . History of hematuria   .  Bradycardia   . Cancer     Right Breast, Uterine    Past Surgical History  Procedure Laterality Date  . Abdominal hysterectomy    . Bladder surgery    . Shoulder surgery Left   . Hand surgery Right   . Eye surgery      Removed glass  . Left breast lumpectomy      Benign  . Colonoscopy  May 2010    Fleishman: normal rectum, internal hemorrhoids, , benign colonic polyp  . Esophagogastroduodenoscopy      2001 Dr. Amedeo Plenty: distal esophagitis, small hiatal hernia,. Dr. Tamala Julian 2006? no records available currently, pt also reports  EGD a few years ago with Dr. Gala Romney, do not have these reports anywhere in medical records  . Esophagogastroduodenoscopy  06/02/2011    [facility]:HENIDP pill impaction as described above s/p dilation of a probable cervical esophageal web/bx abnormal esophageal and gastric mucosa. + H.pylori gastritis     Current Outpatient Prescriptions  Medication Sig Dispense Refill  . amLODipine (NORVASC) 5 MG tablet     . aspirin 81 MG chewable tablet Chew 1 tablet (81 mg total) by mouth daily.    [name] atorvastatin (LIPITOR) 80 MG tablet Take 1 tablet (80 mg total) by mouth daily. 30 tablet 6  . escitalopram (LEXAPRO) 20 MG tablet Take 20 mg by mouth daily.    [name] gabapentin (NEURONTIN) 400 MG capsule Take  1 capsule by mouth daily.    [name] HYDROcodone-acetaminophen (NORCO) 10-325 MG per tablet     . Linaclotide (LINZESS) 145 MCG CAPS capsule Take 1 capsule (145 mcg total) by mouth daily. 30 capsule 5  . lisinopril-hydrochlorothiazide (PRINZIDE,ZESTORETIC) 20-25 MG per tablet     . nitroGLYCERIN (NITROSTAT) 0.4 MG SL tablet Place 1 tablet (0.4 mg total) under the tongue every 5 (five) minutes x 3 doses as needed for chest pain. 25 tablet 3  . pantoprazole (PROTONIX) 40 MG tablet Take 1 tablet (40 mg total) by mouth 2 (two) times daily. 60 tablet 6  . tiZANidine (ZANAFLEX) 4 MG tablet Take 4 mg by mouth every 8 (eight) hours as needed for muscle spasms.    [name] triamcinolone lotion (KENALOG) 0.1  % Apply 1 application topically as needed.      No current facility-administered medications for this visit.    Allergies:  Penicillins   Social History: The patient  reports that she quit smoking about 2 years ago. Her smoking use included Cigarettes. She started smoking about 31 years ago. She has a 6.4 pack-year smoking history. She has never used smokeless tobacco. She reports that she does not drink alcohol or use illicit drugs.   Family History: The patient's family history includes ADD / ADHD in her daughter, son, and son; Alzheimer's disease in her paternal aunt; Bipolar disorder in her daughter, son, and son; Cancer in her father, maternal uncle, and paternal aunt; Cirrhosis in her maternal uncle; Colon cancer in her paternal grandfather; Heart attack in her brother; Heart disease in her brother; Mental illness in her daughter, son, and son.   ROS:  Please see the history of present illness. Otherwise, complete review of systems is positive for none.  All other systems are reviewed and negative.    Physical Exam: VS:  BP 126/82 mmHg  Pulse 80  Ht 5\' 5"  (1.651 m)  Wt 154 lb (69.854 kg)  BMI 25.63 kg/m2  SpO2 98%, BMI Body mass index is 25.63 kg/(m^2).  Wt Readings from Last 3 Encounters:  07/02/14 154 lb (69.854 kg)  03/27/14 155 lb 3.2 oz (70.398 kg)  03/06/14 155 lb (70.308 kg)     Appears comfortable. HEENT: Conjunctiva and lids normal, oropharynx clear.  Neck: Supple, no elevated JVP or carotid bruits, no thyromegaly.  Lungs: Clear to auscultation, nonlabored breathing at rest.  Cardiac: Regular rate and rhythm, no S3 or significant systolic murmur, no pericardial rub.  Abdomen: Soft, nontender, bowel sounds present.  Extremities: No pitting edema, distal pulses 2+.  Skin: Warm and dry.  Musculoskeletal: No kyphosis.  Neuropsychiatric: Alert and oriented x3, affect grossly appropriate.    ECG: ECG is ordered today and reviewed showing sinus rhythm with  decreased anteroseptal R-wave progression.  Recent Labwork: Lab work from October 2015 showed BUN 10, creatinine 0.8, AST 21, ALT 17, potassium 3.9, hemoglobin 12.8, platelets 228.      Component Value Date/Time   CHOL 186 05/21/2013 1155   TRIG 115 05/21/2013 1155   HDL 40 05/21/2013 1155   CHOLHDL 4.7 05/21/2013 1155   VLDL 23 05/21/2013 1155   LDLCALC 123* 05/21/2013 1155    Other Studies Reviewed Today:  Exercise Cardiolite 03/15/2014: MYOCARDIAL IMAGING WITH SPECT (REST AND EXERCISE)  GATED LEFT VENTRICULAR WALL MOTION STUDY  LEFT VENTRICULAR EJECTION FRACTION  TECHNIQUE: Standard myocardial SPECT imaging was performed after resting intravenous injection of 10 mCi Tc-66m sestamibi. Subsequently, exercise tolerance test was performed by the patient under the  supervision of the Cardiology staff. At peak-stress, 30 mCi Tc-76m sestamibi was injected intravenously and standard myocardial SPECT imaging was performed. Quantitative gated imaging was also performed to evaluate left ventricular wall motion, and estimate left ventricular ejection fraction.  FINDINGS: Baseline tracing shows sinus rhythm at 60 beats per min. Patient was exercised on a Bruce protocol for 9 min and 8 seconds achieving a maximum work load of 10.1 METS. Peak heart rate was 153 beats per min which was 88% of the maximal age predicted heart rate. Peak blood pressure was 158/83. She reported non limiting chest pain during the study. ST segment depression of 1/2 to 1 mm noted in leads II, III, and aVF. There were no arrhythmias. Duke treadmill score intermediate risk at -3.  Analysis of the overall perfusion data finds mild breast attenuation.  Perfusion: Small, mild intensity, anteroseptal defect from apex to base that is fixed and consistent with soft tissue attenuation. There is also a small, mild intensity, inferior defect from apex to base that is most prominent at rest imaging and  consistent with soft tissue attenuation.  Wall Motion: Normal left ventricular wall motion. No left ventricular dilation.  Left Ventricular Ejection Fraction: 67 %  End diastolic volume 89 ml  End systolic volume 30 ml  IMPRESSION: 1. Evidence of soft tissue attenuation in the anteroseptal and inferior distribution, no definite ischemia.  2. Normal left ventricular wall motion.  3. Left ventricular ejection fraction 67%  4. Overall low-risk stress test findings* based on perfusion imaging. Duke treadmill score was intermediate risk at -3. Correlate clinically.   ASSESSMENT AND PLAN:  1. Recurring chest discomfort in progressive pattern over the last few weeks, typical and atypical features, rule out accelerating angina. Patient has history of DES to the RCA in 2013 and DES to the LAD in 2014, ischemic workup done in December of this past year is noted above. Symptoms have been progressing more recently. ECG showed today shows no acute ST segment changes. We have discussed proceeding to a cardiac catheterization to clearly reevaluate her coronary anatomy and assess stent patency. Procedure is being scheduled for tomorrow at 1 PM with Dr. Tamala Julian, lab work to be obtained in the morning, chest x-ray today.  2. CAD status post DES to the RCA in December 2013 and DES to the LAD in February 2014.  3. Essential hypertension, blood pressure well controlled today.  4. Hyperlipidemia, on Lipitor.  Current medicines are reviewed with the patient today.     Orders Placed This Encounter  Procedures  . DG Chest 2 View  . EKG 12-Lead    Disposition: FU with me in 3 weeks.   Signed, Satira Sark, MD, [facility] 07/02/2014 10:39 AM    [place] [facility] [place]. [address], [place], [place] [address] Phone: [phone]; Fax: [phone]

## 2014-07-03 NOTE — Interval H&P Note (Signed)
Cath Lab Visit (complete for each Cath Lab visit)  Clinical Evaluation Leading to the Procedure:   ACS: Yes.    Non-ACS:    Anginal Classification: CCS III  Anti-ischemic medical therapy: Maximal Therapy (2 or more classes of medications)  Non-Invasive Test Results: No non-invasive testing performed  Prior CABG: No previous CABG      History and Physical Interval Note:  07/03/2014 12:37 PM  Bethany Wang  has presented today for surgery, with the diagnosis of angina/cad  The various methods of treatment have been discussed with the patient and family. After consideration of risks, benefits and other options for treatment, the patient has consented to  Procedure(s): LEFT HEART CATHETERIZATION WITH CORONARY ANGIOGRAM (N/A) as a surgical intervention .  The patient's history has been reviewed, patient examined, no change in status, stable for surgery.  I have reviewed the patient's chart and labs.  Questions were answered to the patient's satisfaction.     Sinclair Grooms

## 2014-07-03 NOTE — CV Procedure (Signed)
     Left Heart Catheterization with Coronary Angiography  Report  Bethany Wang  49 y.o.  female 1966-01-08  Procedure Date: 07/03/2014 Referring Physician: [name], M.D. Primary Cardiologist: [name], M.D.  INDICATIONS: Angina decubitus  PROCEDURE: 1. Left heart catheterization; 2. Coronary angiography; 3. Left ventriculography  CONSENT:  The risks, benefits, and details of the procedure were explained in detail to the patient. Risks including death, stroke, heart attack, kidney injury, allergy, limb ischemia, bleeding and radiation injury were discussed.  The patient verbalized understanding and wanted to proceed.  Informed written consent was obtained.  PROCEDURE TECHNIQUE:  After Xylocaine anesthesia a 5 French sheath was placed in the right femoral artery using the modified Seldinger technique.  Coronary angiography was done using a 5 F A2 MP catheter.  Left ventriculography was done using the 5 French A2 MP catheter and hand injection.   Images were reviewed. She had total occlusion within the previously stented right coronary artery. There is a long segment from total occlusion to distal patent vessel. The right coronary is well collateralized by the left circumflex and left anterior descending coronary artery. After consideration, the case was terminated.  Hemostasis was achieved with manual compression.   CONTRAST:  Total of 50 cc.  COMPLICATIONS:  None   HEMODYNAMICS:  Aortic pressure 119/70 mmHg; LV pressure 119/3 mmHg; LVEDP 9 mmHg  ANGIOGRAPHIC DATA:   The left main coronary artery is widely patent.  The left anterior descending artery is widely patent including the proximal to mid stent. Irregularities are noted throughout the proximal to mid LAD. The LAD via septal perforators and around the apex supply collaterals to the distal right coronary..  The left circumflex artery is patent. There is 50% mid to distal narrowing before the second obtuse  marginal. The first obtuse marginal branches into two moderate sized vessels that are free of any significant obstruction.  The right coronary artery is totally occluded proximally within the proximal quarter of the previously placed stent. Collaterals fill the distal right coronary into the distal quarter of the right coronary stent. There is approximately 15-20 mm of occlusion noted between the regions of patency.[name]  LEFT VENTRICULOGRAM:  Left ventricular angiogram was done in the 30 RAO projection and revealed normal left ventricular size and function with an estimated ejection fraction of 55%. There is normal end-diastolic pressure.   IMPRESSIONS:  1. Chronic total occlusion of the right coronary stent with a long segment of occlusion noted. The right coronary as well collateralized from both the circumflex and the LAD. It appears that the previously placed stent in the right coronary may have been undersized. 2. Widely patent LAD including the previously placed stent. Irregularities are noted. 3. Widely patent circumflex with 50% distal stenosis before a small second obtuse marginal branch arises. 4. Normal left ventricular function with normal LV end-diastolic pressures.   RECOMMENDATION:    Medical therapy.  We'll add low-dose long-acting nitrates to see if this helps resolve her symptoms. This medication may need to be up titrated.  If continued symptoms on medical therapy, consider referral to the CTO team Andria Frames).[name]

## 2014-07-03 NOTE — Discharge Instructions (Signed)

## 2014-07-09 ENCOUNTER — Other Ambulatory Visit: Payer: Self-pay | Admitting: Gastroenterology

## 2014-07-10 ENCOUNTER — Ambulatory Visit (INDEPENDENT_AMBULATORY_CARE_PROVIDER_SITE_OTHER): Payer: Medicare Other | Admitting: Obstetrics and Gynecology

## 2014-07-10 ENCOUNTER — Encounter: Payer: Self-pay | Admitting: Obstetrics and Gynecology

## 2014-07-10 VITALS — BP 120/80 | Ht 64.0 in | Wt 152.0 lb

## 2014-07-10 DIAGNOSIS — F520 Hypoactive sexual desire disorder: Secondary | ICD-10-CM | POA: Diagnosis not present

## 2014-07-10 DIAGNOSIS — IMO0001 Reserved for inherently not codable concepts without codable children: Secondary | ICD-10-CM

## 2014-07-10 NOTE — Progress Notes (Signed)
Patient ID: Bethany Wang, female   DOB: 1966/03/10, 49 y.o.   MRN: [id number]  This chart was SCRIBED for Bethany Shirk, MD by Stephania Fragmin, ED Scribe. This patient was seen in my office and the patient's care was started at 10:10 AM.    [facility] Visit  Patient name: Bethany Wang MRN [id number]  Date of birth: [birth year]  CC & HPI:  Bethany Wang is a 49 y.o. female presenting today for discussion of her loss of libido. Patient has recently undergone several surgeries, including a left heart catheterization with coronary angiogram that occurred 1 week ago, several back surgeries, and a total hysterectomy due to uterine cancer. Patient complains that she is having issues with her husband, Bethany Wang, as he is interested in regular sexual activity (3 times a week, per husband) but patient has a complete loss of interest ("I just don't care--there's nothing down there!" referring to her total hysterectomy). She states that she is here today to convince her husband that her loss of libido is due to her health issues in addition to the stresses of dealing with her health issues, and is beyond her control. Patient was not given replacement hormone therapy after her hysterectomy.  ROS:  A complete system review of systems was obtained and all systems are negative except as noted in the HPI and PMH.    Pertinent History Reviewed:   Reviewed: Significant for abdominal hysterectomy Medical         Past Medical History  Diagnosis Date  . 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 Seizures   . Essential hypertension, benign   . Mixed hyperlipidemia   . GERD (gastroesophageal reflux disease)   . Suicide attempt     3 attempts in remote past  . PUD (peptic ulcer disease)   . Coronary atherosclerosis of native coronary artery     DES to RCA 03/2012, DES to LAD at [facility] 05/2012  . Type 2 diabetes mellitus   . History of hematuria   . Bradycardia   . Cancer     Right  Breast, Uterine                              Surgical Hx:    Past Surgical History  Procedure Laterality Date  . Abdominal hysterectomy    . Bladder surgery    . Shoulder surgery Left   . Hand surgery Right   . Eye surgery      Removed glass  . Left breast lumpectomy      Benign  . Colonoscopy  May 2010    [facility]le[facility]man: normal rectum, internal hemorrhoids, , benign colonic polyp  . Esophagogastroduodenoscopy      2001 Dr. Amedeo Plenty: distal esophagitis, small hiatal hernia,. Dr. Tamala Julian 2006? no records available currently, pt also reports  EGD a few years ago with Dr. Gala Romney, do not have these reports anywhere in medical records  . Esophagogastroduodenoscopy  06/02/2011    [id number] pill impaction as described above s/p dilation of a probable cervical esophageal web/bx abnormal esophageal and gastric mucosa. + H.pylori gastritis   . Left heart catheterization with coronary angiogram N/A 07/03/2014    Procedure: LEFT HEART CATHETERIZATION WITH CORONARY ANGIOGRAM;  Surgeon: Belva Crome, MD;  Location: [facility] CATH LAB;  Service: Cardiovascular;  Laterality: N/A;   Medications: Reviewed & Updated - see associated section  Current outpatient prescriptions:  .  albuterol (PROVENTIL HFA;VENTOLIN HFA) 108 (90 BASE) MCG/ACT inhaler, Inhale 2 puffs into the lungs every 6 (six) hours as needed for wheezing or shortness of breath., Disp: , Rfl:  .  amLODipine (NORVASC) 5 MG tablet, Take 5 mg by mouth daily. , Disp: , Rfl:  .  aspirin 81 MG chewable tablet, Chew 1 tablet (81 mg total) by mouth daily., Disp: , Rfl:  .  atorvastatin (LIPITOR) 80 MG tablet, Take 1 tablet (80 mg total) by mouth daily., Disp: 30 tablet, Rfl: 6 .  diphenhydrAMINE (BENADRYL) 25 mg capsule, Take 25 mg by mouth daily as needed for allergies., Disp: , Rfl:  .  EPINEPHrine 0.3 mg/0.3 mL IJ SOAJ injection, Inject 0.3 mg into the muscle once., Disp: , Rfl:  .  escitalopram (LEXAPRO) 20 MG tablet, Take 20 mg by mouth  daily., Disp: , Rfl:  .  gabapentin (NEURONTIN) 400 MG capsule, Take 1 capsule by mouth daily., Disp: , Rfl:  .  HYDROcodone-acetaminophen (NORCO) 10-325 MG per tablet, Take 1 tablet by mouth every 4 (four) hours as needed for moderate pain. , Disp: , Rfl:  .  isosorbide mononitrate (IMDUR) 30 MG 24 hr tablet, Take 1 tablet (30 mg total) by mouth daily., Disp: 30 tablet, Rfl: 11 .  Linaclotide (LINZESS) 145 MCG CAPS capsule, Take 1 capsule (145 mcg total) by mouth daily., Disp: 30 capsule, Rfl: 5 .  lisinopril-hydrochlorothiazide (PRINZIDE,ZESTORETIC) 20-25 MG per tablet, Take 1 tablet by mouth daily. , Disp: , Rfl:  .  meloxicam (MOBIC) 15 MG tablet, Take 15 mg by mouth daily., Disp: , Rfl:  .  Naproxen Sodium 220 MG CAPS, Take 1 capsule by mouth daily as needed (pain)., Disp: , Rfl:  .  nitroGLYCERIN (NITROSTAT) 0.4 MG SL tablet, Place 1 tablet (0.4 mg total) under the tongue every 5 (five) minutes x 3 doses as needed for chest pain., Disp: 25 tablet, Rfl: 3 .  pantoprazole (PROTONIX) 40 MG tablet, Take 1 tablet (40 mg total) by mouth 2 (two) times daily., Disp: 60 tablet, Rfl: 6 .  tiZANidine (ZANAFLEX) 4 MG tablet, Take 4 mg by mouth every 8 (eight) hours as needed for muscle spasms., Disp: , Rfl:  .  triamcinolone lotion (KENALOG) 0.1 %, Apply 1 application topically as needed. , Disp: , Rfl:  .  zolpidem (AMBIEN) 10 MG tablet, Take 10 mg by mouth at bedtime., Disp: , Rfl:    Social History: Reviewed -  reports that she quit smoking about 2 years ago. Her smoking use included Cigarettes. She started smoking about 31 years ago. She has a 6.4 pack-year smoking history. She has never used smokeless tobacco.  Objective Findings:  Vitals: Blood pressure 120/80, height 5\' 4"  (1.626 m), weight 152 lb (68.947 kg).  Physical Examination:  Patient here for discussion only.   Assessment & Plan:   A:  1. Desire phase dysfunction 2. Anger in pt  3.   P:  1. counselled for 25+ mins on issues  of relationship, absence of medical options in a pt with CAD, and counselling issues.   I personally performed the services described in this documentation, which was SCRIBED in my presence. The recorded information has been reviewed and considered accurate. It has been edited as necessary during review. Jonnie Kind, MD

## 2014-07-10 NOTE — Progress Notes (Signed)
Patient ID: Bethany Wang, female   DOB: January 22, 1966, 49 y.o.   MRN: [id number] Pt here today to discuss hormones and medications.

## 2014-07-11 ENCOUNTER — Other Ambulatory Visit: Payer: Self-pay | Admitting: Gastroenterology

## 2014-07-15 ENCOUNTER — Encounter: Payer: Self-pay | Admitting: Cardiology

## 2014-07-15 ENCOUNTER — Ambulatory Visit (INDEPENDENT_AMBULATORY_CARE_PROVIDER_SITE_OTHER): Payer: Medicare Other | Admitting: Cardiology

## 2014-07-15 VITALS — BP 128/86 | HR 79 | Ht 64.0 in | Wt 151.1 lb

## 2014-07-15 DIAGNOSIS — I6522 Occlusion and stenosis of left carotid artery: Secondary | ICD-10-CM | POA: Diagnosis not present

## 2014-07-15 DIAGNOSIS — I10 Essential (primary) hypertension: Secondary | ICD-10-CM

## 2014-07-15 DIAGNOSIS — I25119 Atherosclerotic heart disease of native coronary artery with unspecified angina pectoris: Secondary | ICD-10-CM | POA: Diagnosis not present

## 2014-07-15 NOTE — Patient Instructions (Signed)
Your physician recommends that you schedule a follow-up appointment in: 4 MONTHS WITH DR. MCDOWELL  Your physician recommends that you continue on your current medications as directed. Please refer to the Current Medication list given to you today.  Thank you for choosing [facility]!!    

## 2014-07-15 NOTE — Progress Notes (Signed)
Cardiology Office Note  Date: 07/15/2014   ID: Bethany Wang, DOB [birth year], MRN [id number]  PCP: Monico Blitz, MD  Primary Cardiologist: [name], MD   Chief Complaint  Patient presents with  . Coronary Artery Disease  . Postprocedure follow-up    History of Present Illness: Bethany Wang is a 49 y.o. female last seen in March with symptoms concerning for progressing angina. She has a history of DES to the RCA in 2013 as well as DES to the LAD in 2014. Noninvasive ischemic testing was reassuring in December 2015. After discussion, follow-up diagnostic cardiac catheterization was pursued on 07/03/2014 as noted below. She was found to have a chronic total occlusion of the RCA stent with good collateralization from both the circumflex and the LAD, widely patent LAD stent site, 50% distal circumflex stenosis. Medical therapy was recommended, and Dr. Tamala Julian added a long-acting nitrate.  She is here with her husband today. She tells me that she remains very fatigued and weak, also worried about her health. It is not entirely clear that Imdur has made much of a difference at this point. We discussed her cardiac medications, I reviewed the findings of her heart catheterization and explained the significance, the possibility of a CTO intervention, but it is not clear to me that all of the symptoms she describes are purely cardiac related, or how much symptomatic improvement she would have in light of good collateralization already. For now we discussed continuing medical therapy and observation.  We also discussed her carotid artery findings based on MRA from this past November. She had 34% LICA stenosis, and I would anticipate medical therapy and observation for now.   Past Medical History  Diagnosis Date  . 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 Seizures   . Essential hypertension, benign   . Mixed hyperlipidemia   . GERD (gastroesophageal reflux  disease)   . Suicide attempt     3 attempts in remote past  . PUD (peptic ulcer disease)   . Coronary atherosclerosis of native coronary artery     DES to RCA 03/2012, DES to LAD at [facility] 05/2012  . Type 2 diabetes mellitus   . History of hematuria   . Bradycardia   . Cancer     Right Breast, Uterine    Past Surgical History  Procedure Laterality Date  . Abdominal hysterectomy    . Bladder surgery    . Shoulder surgery Left   . Hand surgery Right   . Eye surgery      Removed glass  . Left breast lumpectomy      Benign  . Colonoscopy  May 2010    [facility]man: normal rectum, internal hemorrhoids, , benign colonic polyp  . Esophagogastroduodenoscopy      2001 Dr. Amedeo Plenty: distal esophagitis, small hiatal hernia,. Dr. Tamala Julian 2006? no records available currently, pt also reports  EGD a few years ago with Dr. Gala Romney, do not have these reports anywhere in medical records  . Esophagogastroduodenoscopy  06/02/2011    HDQ:[id number] pill impaction as described above s/p dilation of a probable cervical esophageal web/bx abnormal esophageal and gastric mucosa. + H.pylori gastritis   . Left heart catheterization with coronary angiogram N/A 07/03/2014    Procedure: LEFT HEART CATHETERIZATION WITH CORONARY ANGIOGRAM;  Surgeon: Belva Crome, MD;  Location: [facility] CATH LAB;  Service: Cardiovascular;  Laterality: N/A;    Current Outpatient Prescriptions  Medication Sig Dispense Refill  .  albuterol (PROVENTIL HFA;VENTOLIN HFA) 108 (90 BASE) MCG/ACT inhaler Inhale 2 puffs into the lungs every 6 (six) hours as needed for wheezing or shortness of breath.    [name] amLODipine (NORVASC) 5 MG tablet Take 5 mg by mouth daily.     [name] aspirin 81 MG chewable tablet Chew 1 tablet (81 mg total) by mouth daily.    [name] atorvastatin (LIPITOR) 80 MG tablet Take 1 tablet (80 mg total) by mouth daily. 30 tablet 6  . diphenhydrAMINE (BENADRYL) 25 mg capsule Take 25 mg by mouth daily as needed for allergies.    [name] EPINEPHrine 0.3 mg/0.3 mL  IJ SOAJ injection Inject 0.3 mg into the muscle once.    . escitalopram (LEXAPRO) 20 MG tablet Take 20 mg by mouth daily.    [name] gabapentin (NEURONTIN) 400 MG capsule Take 1 capsule by mouth daily.    [name] HYDROcodone-acetaminophen (NORCO) 10-325 MG per tablet Take 1 tablet by mouth every 4 (four) hours as needed for moderate pain.     . isosorbide mononitrate (IMDUR) 30 MG 24 hr tablet Take 1 tablet (30 mg total) by mouth daily. 30 tablet 11  . Linaclotide (LINZESS) 145 MCG CAPS capsule Take 1 capsule (145 mcg total) by mouth daily. 30 capsule 5  . lisinopril-hydrochlorothiazide (PRINZIDE,ZESTORETIC) 20-25 MG per tablet Take 1 tablet by mouth daily.     . meloxicam (MOBIC) 15 MG tablet Take 15 mg by mouth daily.    . Naproxen Sodium 220 MG CAPS Take 1 capsule by mouth daily as needed (pain).    . nitroGLYCERIN (NITROSTAT) 0.4 MG SL tablet Place 1 tablet (0.4 mg total) under the tongue every 5 (five) minutes x 3 doses as needed for chest pain. 25 tablet 3  . pantoprazole (PROTONIX) 40 MG tablet Take 1 tablet (40 mg total) by mouth 2 (two) times daily. 60 tablet 6  . tiZANidine (ZANAFLEX) 4 MG tablet Take 4 mg by mouth every 8 (eight) hours as needed for muscle spasms.    [name] triamcinolone lotion (KENALOG) 0.1 % Apply 1 application topically as needed.     . zolpidem (AMBIEN) 10 MG tablet Take 10 mg by mouth at bedtime.     No current facility-administered medications for this visit.    Allergies:  Penicillins   Social History: The patient  reports that she quit smoking about 2 years ago. Her smoking use included Cigarettes. She started smoking about 31 years ago. She has a 6.4 pack-year smoking history. She has never used smokeless tobacco. She reports that she does not drink alcohol or use illicit drugs.   ROS:  Please see the history of present illness. Otherwise, complete review of systems is positive for none.  All other systems are reviewed and negative.   Physical Exam: VS:  BP 128/86 mmHg   Pulse 79  Ht 5\' 4"  (1.626 m)  Wt 151 lb 1.9 oz (68.548 kg)  BMI 25.93 kg/m2  SpO2 96%, BMI Body mass index is 25.93 kg/(m^2).  Wt Readings from Last 3 Encounters:  07/15/14 151 lb 1.9 oz (68.548 kg)  07/10/14 152 lb (68.947 kg)  07/02/14 154 lb (69.854 kg)     Appears comfortable. HEENT: Conjunctiva and lids normal, oropharynx clear.  Neck: Supple, no elevated JVP or carotid bruits, no thyromegaly.  Lungs: Clear to auscultation, nonlabored breathing at rest.  Cardiac: Regular rate and rhythm, no S3 or significant systolic murmur, no pericardial rub.  Abdomen: Soft, nontender, bowel sounds present.  Extremities: No pitting edema,  distal pulses 2+.  Skin: Warm and dry.  Musculoskeletal: No kyphosis.  Neuropsychiatric: Alert and oriented x3, affect grossly appropriate.   ECG: ECG is not ordered today.   Recent Labwork: 07/03/2014: BUN 6; Creatinine 0.76; Hemoglobin 13.0; Platelets 245; Potassium 3.6; Sodium 140     Component Value Date/Time   CHOL 186 05/21/2013 1155   TRIG 115 05/21/2013 1155   HDL 40 05/21/2013 1155   CHOLHDL 4.7 05/21/2013 1155   VLDL 23 05/21/2013 1155   LDLCALC 123* 05/21/2013 1155    Other Studies Reviewed Today:  Cardiac catheterization 07/03/2014: ANGIOGRAPHIC DATA: The left main coronary artery is widely patent.  The left anterior descending artery is widely patent including the proximal to mid stent. Irregularities are noted throughout the proximal to mid LAD. The LAD via septal perforators and around the apex supply collaterals to the distal right coronary..  The left circumflex artery is patent. There is 50% mid to distal narrowing before the second obtuse marginal. The first obtuse marginal branches into two moderate sized vessels that are free of any significant obstruction.  The right coronary artery is totally occluded proximally within the proximal quarter of the previously placed stent. Collaterals fill the distal right coronary  into the distal quarter of the right coronary stent. There is approximately 15-20 mm of occlusion noted between the regions of patency.[name]  LEFT VENTRICULOGRAM: Left ventricular angiogram was done in the 30 RAO projection and revealed normal left ventricular size and function with an estimated ejection fraction of 55%. There is normal end-diastolic pressure.   Assessment and Plan:  1. CAD with history of DES to the RCA in 2013 and DES to the LAD in 2014. Recent cardiac catheterization shows chronic occlusion of the proximal RCA at the stent site with good collateralization and also patent LAD stent site. I reviewed this with the patient and her husband today. For now would recommend medical therapy, she was recently started on Imdur. Although she does have angina, she also has other symptoms which could be multifactorial, and it is not clear to me that pursuing a CTO intervention would necessarily give her complete symptom benefit at this point.  2. Mild carotid artery atherosclerosis by MRA in November 2015. Continue aspirin and statin.  3. Essential hypertension with reasonable blood pressure control, continue current regimen.  Current medicines were reviewed with the patient today. No changes were made.  Disposition: FU with me in 4 months.   Signed, Satira Sark, MD, [facility] 07/15/2014 10:26 AM    [facility] [place], [place], [place] [address] Phone: [phone]; Fax: [phone]

## 2014-07-23 ENCOUNTER — Encounter: Payer: Medicare Other | Admitting: Cardiology

## 2014-07-31 ENCOUNTER — Other Ambulatory Visit: Payer: Self-pay

## 2014-07-31 ENCOUNTER — Other Ambulatory Visit: Payer: Self-pay | Admitting: Gastroenterology

## 2014-08-21 ENCOUNTER — Telehealth: Payer: Self-pay | Admitting: *Deleted

## 2014-08-21 NOTE — Telephone Encounter (Signed)
Called and spoke with patient's husband and he informed nurse that patient was at home and he was away from the house with the phone. Husband stated that patient's symptoms were not any worse. Nurse advised him to let her know that if her chest pain gets worse, that she needed to go to the ED for an evaluation.

## 2014-08-21 NOTE — Telephone Encounter (Signed)
Patient called requesting a sooner appointment for ongoing chest pain. Patient said she had been using her nitroglycerin tablets. Patient said the chest pain seems to come on when she is in there process of taking a shower. Patient was given an appointment to see Dr. Domenic Polite tomorrow and advised if her symptoms get worse, and she takes the 3 rd nitroglycerin, that she needed to proceed to the ED for an evaluation. Patient verbalized understanding plan.

## 2014-08-22 ENCOUNTER — Ambulatory Visit (INDEPENDENT_AMBULATORY_CARE_PROVIDER_SITE_OTHER): Payer: Medicare Other | Admitting: Cardiology

## 2014-08-22 ENCOUNTER — Encounter: Payer: Self-pay | Admitting: Cardiology

## 2014-08-22 VITALS — BP 108/71 | HR 72 | Ht 64.0 in | Wt 155.8 lb

## 2014-08-22 DIAGNOSIS — I25119 Atherosclerotic heart disease of native coronary artery with unspecified angina pectoris: Secondary | ICD-10-CM | POA: Diagnosis not present

## 2014-08-22 DIAGNOSIS — R079 Chest pain, unspecified: Secondary | ICD-10-CM | POA: Diagnosis not present

## 2014-08-22 DIAGNOSIS — I10 Essential (primary) hypertension: Secondary | ICD-10-CM | POA: Diagnosis not present

## 2014-08-22 MED ORDER — ISOSORBIDE MONONITRATE ER 30 MG PO TB24: 30.0000 mg | ORAL_TABLET | Freq: Two times a day (BID) | ORAL | Status: DC

## 2014-08-22 NOTE — Patient Instructions (Signed)
Your physician has recommended you make the following change in your medication:  Increase your imdur 30 mg to twice daily. Continue all other medications the same. Your physician recommends that you schedule a follow-up appointment in: 3 months. Please contact Dr. Orvan Falconer office for an appointment.

## 2014-08-22 NOTE — Progress Notes (Signed)
Cardiology Office Note  Date: 08/22/2014   ID: Bethany Wang, DOB [birth year], MRN [id number]  PCP: Monico Blitz, MD  Primary Cardiologist: [name], MD   Chief Complaint  Patient presents with  . Chest Pain    History of Present Illness: Bethany Wang is a 49 y.o. female that I most recently saw in April. She had a cardiac catheterization done in March due to progressive chest pain symptoms demonstrating chronic total occlusion of the proximal RCA at prior stent site with good collateralization from both the circumflex and the LAD, otherwise widely patent LAD stent site and 50% distal circumflex stenosis. Medical therapy was recommended by Dr. Tamala Julian and a long-acting nitrate was added.  She is here today for a follow-up visit due to recurring chest pain, several family members present. Describes a left-sided dull discomfort, also into her left arm, can last for hours at a time. She takes nitroglycerin, reportedly with some relief, has been taking Imdur in the morning as well. In addition she reports anorexia, only eats one meal a day at dinnertime, also reflux symptoms. She states that she has seen Dr. Gala Romney. Her weight has been stable.  Today I reviewed her cardiac catheterization findings from March and explained the significance. It is not entirely clear to me that all of her symptoms are in fact angina, however she does have substrate for angina. She may have other gastrointestinal symptoms overlying as well. I have recommended that we try to increase her Imdur to 30 mg twice daily to see if this helps at all. I am still not optimistic that referring her for a chronic total occlusion intervention of the RCA would make a dramatic difference in her symptoms, but would not rule out referral for further discussion.   Past Medical History  Diagnosis Date  . 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 Seizures   . Essential hypertension, benign   . Mixed  hyperlipidemia   . GERD (gastroesophageal reflux disease)   . Suicide attempt     3 attempts in remote past  . PUD (peptic ulcer disease)   . Coronary atherosclerosis of native coronary artery     DES to RCA 03/2012, DES to LAD at [facility] 05/2012  . Type 2 diabetes mellitus   . History of hematuria   . Bradycardia   . Cancer     Right Breast, Uterine     Current Outpatient Prescriptions  Medication Sig Dispense Refill  . albuterol (PROVENTIL HFA;VENTOLIN HFA) 108 (90 BASE) MCG/ACT inhaler Inhale 2 puffs into the lungs every 6 (six) hours as needed for wheezing or shortness of breath.    [name] amLODipine (NORVASC) 5 MG tablet Take 5 mg by mouth daily.     [name] aspirin 81 MG chewable tablet Chew 1 tablet (81 mg total) by mouth daily.    [name] atorvastatin (LIPITOR) 80 MG tablet Take 1 tablet (80 mg total) by mouth daily. 30 tablet 6  . diphenhydrAMINE (BENADRYL) 25 mg capsule Take 25 mg by mouth daily as needed for allergies.    [name] EPINEPHrine 0.3 mg/0.3 mL IJ SOAJ injection Inject 0.3 mg into the muscle once.    . escitalopram (LEXAPRO) 20 MG tablet Take 20 mg by mouth daily.    [name] gabapentin (NEURONTIN) 400 MG capsule Take 1 capsule by mouth daily.    [name] HYDROcodone-acetaminophen (NORCO) 10-325 MG per tablet Take 1 tablet by mouth every 4 (four) hours as needed  for moderate pain.     . isosorbide mononitrate (IMDUR) 30 MG 24 hr tablet Take 1 tablet (30 mg total) by mouth daily. 30 tablet 11  . Linaclotide (LINZESS) 145 MCG CAPS capsule Take 1 capsule (145 mcg total) by mouth daily. 30 capsule 5  . lisinopril-hydrochlorothiazide (PRINZIDE,ZESTORETIC) 20-25 MG per tablet Take 1 tablet by mouth daily.     . meloxicam (MOBIC) 15 MG tablet Take 15 mg by mouth daily.    . Naproxen Sodium 220 MG CAPS Take 1 capsule by mouth daily as needed (pain).    . nitroGLYCERIN (NITROSTAT) 0.4 MG SL tablet Place 1 tablet (0.4 mg total) under the tongue every 5 (five) minutes x 3 doses as needed for chest pain. 25 tablet 3   . pantoprazole (PROTONIX) 40 MG tablet Take 1 tablet (40 mg total) by mouth 2 (two) times daily. 60 tablet 6  . tiZANidine (ZANAFLEX) 4 MG tablet Take 4 mg by mouth every 8 (eight) hours as needed for muscle spasms.    [name] triamcinolone lotion (KENALOG) 0.1 % Apply 1 application topically as needed.     . zolpidem (AMBIEN) 10 MG tablet Take 10 mg by mouth at bedtime.     No current facility-administered medications for this visit.    Allergies:  Penicillins   Social History: The patient  reports that she quit smoking about 2 years ago. Her smoking use included Cigarettes. She started smoking about 31 years ago. She has a 6.4 pack-year smoking history. She has never used smokeless tobacco. She reports that she does not drink alcohol or use illicit drugs.    ROS:  Please see the history of present illness. Otherwise, complete review of systems is positive for none.  All other systems are reviewed and negative.   Physical Exam: VS:  BP 108/71 mmHg  Pulse 72  Ht 5\' 4"  (1.626 m)  Wt 155 lb 12.8 oz (70.67 kg)  BMI 26.73 kg/m2  SpO2 99%, BMI Body mass index is 26.73 kg/(m^2).  Wt Readings from Last 3 Encounters:  08/22/14 155 lb 12.8 oz (70.67 kg)  07/15/14 151 lb 1.9 oz (68.548 kg)  07/10/14 152 lb (68.947 kg)     Appears comfortable. HEENT: Conjunctiva and lids normal, oropharynx clear.  Neck: Supple, no elevated JVP or carotid bruits, no thyromegaly.  Lungs: Clear to auscultation, nonlabored breathing at rest.  Cardiac: Regular rate and rhythm, no S3 or significant systolic murmur, no pericardial rub.  Abdomen: Soft, nontender, bowel sounds present.  Extremities: No pitting edema, distal pulses 2+.  Skin: Warm and dry.  Musculoskeletal: No kyphosis.  Neuropsychiatric: Alert and oriented x3, affect grossly appropriate.   ECG: ECG is ordered today and shows sinus rhythm with no acute ST segment changes, nonspecific T-wave flattening.   Recent Labwork: 07/03/2014: BUN 6;  Creatinine 0.76; Hemoglobin 13.0; Platelets 245; Potassium 3.6; Sodium 140     Component Value Date/Time   CHOL 186 05/21/2013 1155   TRIG 115 05/21/2013 1155   HDL 40 05/21/2013 1155   CHOLHDL 4.7 05/21/2013 1155   VLDL 23 05/21/2013 1155   LDLCALC 123* 05/21/2013 1155    Assessment and Plan:  1. Recurrent chest pain, not entirely clear that all of this represents angina however. She clearly has CAD as documented above, and we will try to further optimize her medical regimen by increasing Imdur to 30 mg twice daily. ECG does not show any acute ST segment changes. I have also recommended that she follow-up with gastroenterology  in case there are any other contributing features to her symptoms.  2. CAD status post DES to the RCA in 2013 and DES to the LAD in 2014. Patient has recently documented chronic total occlusion of the RCA stent site with good collateralization, patent LAD stent, and otherwise nonobstructive disease. We will continue medical therapy at this time. I am not convinced that pursuing a chronic total occlusion intervention to the RCA would provide substantial symptom benefit, but would not rule out referral for further discussion if symptoms continue despite medication adjustments.  3. Essential hypertension, blood pressure is well controlled today.  Current medicines were reviewed with the patient today.   Orders Placed This Encounter  Procedures  . EKG 12-Lead    Disposition: FU with me in 3 months.   Signed, Satira Sark, MD, [facility] 08/22/2014 3:03 PM    [place] at [place], [place], [place] [address] Phone: [phone]; Fax: [phone]

## 2014-09-26 ENCOUNTER — Other Ambulatory Visit: Payer: Self-pay | Admitting: Gastroenterology

## 2014-10-08 ENCOUNTER — Ambulatory Visit: Payer: Medicare Other | Admitting: Obstetrics & Gynecology

## 2014-10-10 ENCOUNTER — Ambulatory Visit: Payer: Medicare Other | Admitting: Obstetrics & Gynecology

## 2014-11-26 ENCOUNTER — Encounter: Payer: Medicare Other | Admitting: Cardiology

## 2014-11-26 ENCOUNTER — Encounter: Payer: Self-pay | Admitting: Cardiology

## 2014-11-26 NOTE — Progress Notes (Signed)
No show  This encounter was created in error - please disregard.

## 2014-12-06 ENCOUNTER — Other Ambulatory Visit: Payer: Self-pay | Admitting: Internal Medicine

## 2014-12-06 DIAGNOSIS — N63 Unspecified lump in unspecified breast: Secondary | ICD-10-CM

## 2014-12-17 ENCOUNTER — Ambulatory Visit (INDEPENDENT_AMBULATORY_CARE_PROVIDER_SITE_OTHER): Payer: Medicare Other | Admitting: Cardiology

## 2014-12-17 ENCOUNTER — Encounter: Payer: Self-pay | Admitting: Cardiology

## 2014-12-17 VITALS — BP 128/73 | HR 85 | Ht 64.0 in | Wt 157.4 lb

## 2014-12-17 DIAGNOSIS — I25119 Atherosclerotic heart disease of native coronary artery with unspecified angina pectoris: Secondary | ICD-10-CM | POA: Diagnosis not present

## 2014-12-17 DIAGNOSIS — I10 Essential (primary) hypertension: Secondary | ICD-10-CM | POA: Diagnosis not present

## 2014-12-17 MED ORDER — NITROGLYCERIN 0.4 MG SL SUBL: 0.4000 mg | SUBLINGUAL_TABLET | SUBLINGUAL | Status: DC | PRN

## 2014-12-17 MED ORDER — ISOSORBIDE MONONITRATE ER 60 MG PO TB24: 60.0000 mg | ORAL_TABLET | Freq: Every day | ORAL | Status: DC

## 2014-12-17 NOTE — Patient Instructions (Signed)
Your physician has recommended you make the following change in your medication:  Change isosorbide mononitrate (imdur) to 60 mg daily in the morning. You may take (2) of your 30 mg tablets in the morning until they are finished. Refill sent for nitroglycerin. Continue all other medications the same. Your physician recommends that you schedule a follow-up appointment in: 4 months. You will receive a reminder letter in the mail in about 1-2 months reminding you to call and schedule your appointment. If you don't receive this letter, please contact our office.

## 2014-12-17 NOTE — Progress Notes (Signed)
Cardiology Office Note  Date: 12/17/2014   ID: TIMOTHY TOWNSEL, DOB 30-Oct-1965, MRN [id number]  PCP: Monico Blitz, MD  Primary Cardiologist: [name], MD   Chief Complaint  Patient presents with  . Coronary Artery Disease    History of Present Illness: Bethany Wang is a 49 y.o. female last seen in May. At the last visit Imdur was increased to 30 mg twice daily. She presents today with her husband for a follow-up visit. She reports intermittent episodes of chest tightness and syncope, seems to be under significant stress at home. She does not identify any clear precipitant for the symptoms, although two have occurred at church.  She had a cardiac catheterization done in March due to progressive chest pain symptoms demonstrating chronic total occlusion of the proximal RCA at prior stent site with good collateralization from both the circumflex and the LAD, otherwise widely patent LAD stent site and 50% distal circumflex stenosis. Medical therapy was recommended by Dr. Tamala Julian and a long-acting nitrate was added.  We discussed stress reduction, also changing her Imdur to 60 mg in the morning to better cover daytime hours. Still not convinced that referral for a CTO intervention is the best course of action at this time.  Past Medical History  Diagnosis Date  . 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 Seizures   . Essential hypertension, benign   . Mixed hyperlipidemia   . GERD (gastroesophageal reflux disease)   . Suicide attempt     3 attempts in remote past  . PUD (peptic ulcer disease)   . Coronary atherosclerosis of native coronary artery     DES to RCA 03/2012, DES to LAD at [facility] 05/2012  . Type 2 diabetes mellitus   . History of hematuria   . Bradycardia   . Cancer     Right Breast, Uterine    Current Outpatient Prescriptions  Medication Sig Dispense Refill  . albuterol (PROVENTIL HFA;VENTOLIN HFA) 108 (90 BASE) MCG/ACT inhaler Inhale 2  puffs into the lungs every 6 (six) hours as needed for wheezing or shortness of breath.    [name] amLODipine (NORVASC) 5 MG tablet Take 5 mg by mouth daily.     [name] aspirin 81 MG chewable tablet Chew 1 tablet (81 mg total) by mouth daily.    [name] atorvastatin (LIPITOR) 80 MG tablet Take 1 tablet (80 mg total) by mouth daily. 30 tablet 6  . diphenhydrAMINE (BENADRYL) 25 mg capsule Take 25 mg by mouth daily as needed for allergies.    [name] EPINEPHrine 0.3 mg/0.3 mL IJ SOAJ injection Inject 0.3 mg into the muscle once.    . escitalopram (LEXAPRO) 20 MG tablet Take 20 mg by mouth daily.    [name] gabapentin (NEURONTIN) 400 MG capsule Take 1 capsule by mouth daily.    [name] HYDROcodone-acetaminophen (NORCO) 10-325 MG per tablet Take 1 tablet by mouth every 4 (four) hours as needed for moderate pain.     . Linaclotide (LINZESS) 145 MCG CAPS capsule Take 1 capsule (145 mcg total) by mouth daily. 30 capsule 5  . lisinopril-hydrochlorothiazide (PRINZIDE,ZESTORETIC) 20-25 MG per tablet Take 1 tablet by mouth daily.     . meloxicam (MOBIC) 15 MG tablet Take 15 mg by mouth daily.    . Naproxen Sodium 220 MG CAPS Take 1 capsule by mouth daily as needed (pain).    . nitroGLYCERIN (NITROSTAT) 0.4 MG SL tablet Place 1 tablet (0.4 mg total) under the  tongue every 5 (five) minutes x 3 doses as needed for chest pain. If no relief after 3rd dose, proceed to the ED for an evaluation 25 tablet 3  . pantoprazole (PROTONIX) 40 MG tablet Take 1 tablet (40 mg total) by mouth 2 (two) times daily. 60 tablet 6  . tiZANidine (ZANAFLEX) 4 MG tablet Take 4 mg by mouth every 8 (eight) hours as needed for muscle spasms.    [name] triamcinolone lotion (KENALOG) 0.1 % Apply 1 application topically as needed.     . zolpidem (AMBIEN) 10 MG tablet Take 10 mg by mouth at bedtime.    . isosorbide mononitrate (IMDUR) 60 MG 24 hr tablet Take 1 tablet (60 mg total) by mouth daily. In the morning 90 tablet 3   No current facility-administered medications for this  visit.    Allergies:  Penicillins   Social History: The patient  reports that she quit smoking about 2 years ago. Her smoking use included Cigarettes. She started smoking about 31 years ago. She has a 6.4 pack-year smoking history. She has never used smokeless tobacco. She reports that she does not drink alcohol or use illicit drugs.   ROS:  Please see the history of present illness. Otherwise, complete review of systems is positive for active psychosocial stressors.  All other systems are reviewed and negative.   Physical Exam: VS:  BP 128/73 mmHg  Pulse 85  Ht 5\' 4"  (1.626 m)  Wt 157 lb 6.4 oz (71.396 kg)  BMI 27.00 kg/m2  SpO2 98%, BMI Body mass index is 27 kg/(m^2).  Wt Readings from Last 3 Encounters:  12/17/14 157 lb 6.4 oz (71.396 kg)  08/22/14 155 lb 12.8 oz (70.67 kg)  07/15/14 151 lb 1.9 oz (68.548 kg)     Appears comfortable. HEENT: Conjunctiva and lids normal, oropharynx clear.  Neck: Supple, no elevated JVP or carotid bruits, no thyromegaly.  Lungs: Clear to auscultation, nonlabored breathing at rest.  Cardiac: Regular rate and rhythm, no S3 or significant systolic murmur, no pericardial rub.  Abdomen: Soft, nontender, bowel sounds present.  Extremities: No pitting edema, distal pulses 2+.   ECG: ECG is not ordered today.   Recent Labwork: 07/03/2014: BUN 6; Creatinine, Ser 0.76; Hemoglobin 13.0; Platelets 245; Potassium 3.6; Sodium 140     Component Value Date/Time   CHOL 186 05/21/2013 1155   TRIG 115 05/21/2013 1155   HDL 40 05/21/2013 1155   CHOLHDL 4.7 05/21/2013 1155   VLDL 23 05/21/2013 1155   LDLCALC 123* 05/21/2013 1155    Assessment and Plan:  1. Recurring chest pain with history of ischemic heart disease as outlined above. Cardiac catheterization from earlier in the year shows occlusion of the RCA with left-to-right collaterals from the circumflex and LAD, patent LAD stent site. Plan to change Imdur to 60 mg in the morning and continue  medical therapy. Also discussed importance of stress reduction. Not clear that referral for CTO intervention is indicated at this time.  2. Essential hypertension, blood pressure is reasonably controlled today.  Current medicines were reviewed with the patient today.  Disposition: FU with me in 4 months.   Signed, Satira Sark, MD, [facility] 12/17/2014 4:21 PM    [place] at [place], [place], [place] [address] Phone: [phone]; Fax: [phone]

## 2015-01-27 ENCOUNTER — Other Ambulatory Visit: Payer: Self-pay | Admitting: Internal Medicine

## 2015-01-27 DIAGNOSIS — N63 Unspecified lump in unspecified breast: Secondary | ICD-10-CM

## 2015-02-03 ENCOUNTER — Other Ambulatory Visit: Payer: Medicare Other

## 2015-02-03 ENCOUNTER — Inpatient Hospital Stay: Admission: RE | Admit: 2015-02-03 | Payer: Medicare Other | Source: Ambulatory Visit

## 2015-03-13 ENCOUNTER — Ambulatory Visit (INDEPENDENT_AMBULATORY_CARE_PROVIDER_SITE_OTHER): Payer: Medicare Other | Admitting: Cardiology

## 2015-03-13 ENCOUNTER — Encounter: Payer: Self-pay | Admitting: Cardiology

## 2015-03-13 VITALS — BP 138/82 | HR 70 | Ht 64.0 in | Wt 158.4 lb

## 2015-03-13 DIAGNOSIS — R0789 Other chest pain: Secondary | ICD-10-CM | POA: Diagnosis not present

## 2015-03-13 DIAGNOSIS — I25119 Atherosclerotic heart disease of native coronary artery with unspecified angina pectoris: Secondary | ICD-10-CM | POA: Diagnosis not present

## 2015-03-13 MED ORDER — ISOSORBIDE MONONITRATE ER 60 MG PO TB24: ORAL_TABLET | ORAL | Status: DC

## 2015-03-13 NOTE — Addendum Note (Signed)
Addended by: Barbarann Ehlers A on: 03/13/2015 03:31 PM   Modules accepted: Orders

## 2015-03-13 NOTE — Progress Notes (Deleted)
Cardiology Office Note   Date:  03/13/2015   ID:  KOA CRICKMORE, DOB Nov 15, 1965, MRN [id number]  PCP:  Monico Blitz, MD  Cardiologist:  [place]/ Jory Sims, NP   No chief complaint on file.     History of Present Illness: Bethany Wang is a 49 y.o. female who presents for ongoing assessment and management of CAD, with hx of chronic total occlusion of the proximal RCA at prior stent site with good collateralization from both the circumflex and the LAD, otherwise widely patent LAD stent site and 50% distal circumflex stenosis. Medical therapy was recommended by Dr. Tamala Julian and a long-acting nitrate was added in March of 2016. Marland KitchenOther hx includes hypertension and anxiety.     Past Medical History  Diagnosis Date  . 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 Seizures   . Essential hypertension, benign   . Mixed hyperlipidemia   . GERD (gastroesophageal reflux disease)   . Suicide attempt     3 attempts in remote past  . PUD (peptic ulcer disease)   . Coronary atherosclerosis of native coronary artery     DES to RCA 03/2012, DES to LAD at [facility] 05/2012  . Type 2 diabetes mellitus   . History of hematuria   . Bradycardia   . Cancer     Right Breast, Uterine    Past Surgical History  Procedure Laterality Date  . Abdominal hysterectomy    . Bladder surgery    . Shoulder surgery Left   . Hand surgery Right   . Eye surgery      Removed glass  . Left breast lumpectomy      Benign  . Colonoscopy  May 2010    [facility]: normal rectum, internal hemorrhoids, , benign colonic polyp  . Esophagogastroduodenoscopy      2001 Dr. Amedeo Plenty: distal esophagitis, small hiatal hernia,. Dr. Tamala Julian 2006? no records available currently, pt also reports  EGD a few years ago with Dr. Gala Romney, do not have these reports anywhere in medical records  . Esophagogastroduodenoscopy  06/02/2011    [id number] pill impaction as described above s/p dilation of a probable cervical  esophageal web/bx abnormal esophageal and gastric mucosa. + H.pylori gastritis   . Left heart catheterization with coronary angiogram N/A 07/03/2014    Procedure: LEFT HEART CATHETERIZATION WITH CORONARY ANGIOGRAM;  Surgeon: Belva Crome, MD;  Location: [facility] CATH LAB;  Service: Cardiovascular;  Laterality: N/A;     Current Outpatient Prescriptions  Medication Sig Dispense Refill  . albuterol (PROVENTIL HFA;VENTOLIN HFA) 108 (90 BASE) MCG/ACT inhaler Inhale 2 puffs into the lungs every 6 (six) hours as needed for wheezing or shortness of breath.    [name] amLODipine (NORVASC) 5 MG tablet Take 5 mg by mouth daily.     [name] aspirin 81 MG chewable tablet Chew 1 tablet (81 mg total) by mouth daily.    [name] atorvastatin (LIPITOR) 80 MG tablet Take 1 tablet (80 mg total) by mouth daily. 30 tablet 6  . diphenhydrAMINE (BENADRYL) 25 mg capsule Take 25 mg by mouth daily as needed for allergies.    [name] EPINEPHrine 0.3 mg/0.3 mL IJ SOAJ injection Inject 0.3 mg into the muscle once.    . escitalopram (LEXAPRO) 20 MG tablet Take 20 mg by mouth daily.    [name] gabapentin (NEURONTIN) 400 MG capsule Take 1 capsule by mouth daily.    [name] HYDROcodone-acetaminophen (NORCO) 10-325 MG per tablet Take 1 tablet by mouth every  4 (four) hours as needed for moderate pain.     . isosorbide mononitrate (IMDUR) 60 MG 24 hr tablet Take 1 tablet (60 mg total) by mouth daily. In the morning 90 tablet 3  . Linaclotide (LINZESS) 145 MCG CAPS capsule Take 1 capsule (145 mcg total) by mouth daily. 30 capsule 5  . lisinopril-hydrochlorothiazide (PRINZIDE,ZESTORETIC) 20-25 MG per tablet Take 1 tablet by mouth daily.     . meloxicam (MOBIC) 15 MG tablet Take 15 mg by mouth daily.    . Naproxen Sodium 220 MG CAPS Take 1 capsule by mouth daily as needed (pain).    . nitroGLYCERIN (NITROSTAT) 0.4 MG SL tablet Place 1 tablet (0.4 mg total) under the tongue every 5 (five) minutes x 3 doses as needed for chest pain. If no relief after 3rd dose, proceed to the  ED for an evaluation 25 tablet 3  . pantoprazole (PROTONIX) 40 MG tablet Take 1 tablet (40 mg total) by mouth 2 (two) times daily. 60 tablet 6  . tiZANidine (ZANAFLEX) 4 MG tablet Take 4 mg by mouth every 8 (eight) hours as needed for muscle spasms.    [name] triamcinolone lotion (KENALOG) 0.1 % Apply 1 application topically as needed.     . zolpidem (AMBIEN) 10 MG tablet Take 10 mg by mouth at bedtime.     No current facility-administered medications for this visit.    Allergies:   Penicillins    Social History:  The patient  reports that she quit smoking about 2 years ago. Her smoking use included Cigarettes. She started smoking about 31 years ago. She has a 6.4 pack-year smoking history. She has never used smokeless tobacco. She reports that she does not drink alcohol or use illicit drugs.   Family History:  The patient's family history includes ADD / ADHD in her daughter, son, and son; Alzheimer's disease in her paternal aunt; Bipolar disorder in her daughter, son, and son; Cancer in her father, maternal uncle, and paternal aunt; Cirrhosis in her maternal uncle; Colon cancer in her paternal grandfather; Heart attack in her brother; Heart disease in her brother; Mental illness in her daughter, son, and son.    ROS: All other systems are reviewed and negative. Unless otherwise mentioned in H&P    PHYSICAL EXAM: VS:  There were no vitals taken for this visit. , BMI There is no weight on file to calculate BMI. GEN: Well nourished, well developed, in no acute distress HEENT: normal Neck: no JVD, carotid bruits, or masses Cardiac: ***RRR; no murmurs, rubs, or gallops,no edema  Respiratory:  clear to auscultation bilaterally, normal work of breathing GI: soft, nontender, nondistended, + BS MS: no deformity or atrophy Skin: warm and dry, no rash Neuro:  Strength and sensation are intact Psych: euthymic mood, full affect   EKG:  EKG {ACTION; IS/IS [id number] ordered today. The ekg ordered  today demonstrates ***   Recent Labs: 07/03/2014: BUN 6; Creatinine, Ser 0.76; Hemoglobin 13.0; Platelets 245; Potassium 3.6; Sodium 140    Lipid Panel    Component Value Date/Time   CHOL 186 05/21/2013 1155   TRIG 115 05/21/2013 1155   HDL 40 05/21/2013 1155   CHOLHDL 4.7 05/21/2013 1155   VLDL 23 05/21/2013 1155   LDLCALC 123* 05/21/2013 1155      Wt Readings from Last 3 Encounters:  12/17/14 157 lb 6.4 oz (71.396 kg)  08/22/14 155 lb 12.8 oz (70.67 kg)  07/15/14 151 lb 1.9 oz (68.548 kg)  Other studies Reviewed: Additional studies/ records that were reviewed today include: ***. Review of the above records demonstrates: ***   ASSESSMENT AND PLAN:  1.  ***   Current medicines are reviewed at length with the patient today.    Labs/ tests ordered today include: *** No orders of the defined types were placed in this encounter.     Disposition:   FU with *** in {gen number [id number] {TIME; UNITS DAY/WEEK/MONTH:19136}   Signed, Jory Sims, NP  03/13/2015 7:24 AM    [place] [address] [place], [place] [address] Phone: [phone]; Fax: [phone]

## 2015-03-13 NOTE — Patient Instructions (Signed)
Your physician recommends that you schedule a follow-up appointment in:  3 months in [place] with Dr.McDowell   INCREASE Imdur to 60 mg  (1 tablet) in the morning , and 30 mg (1/2 tablet) at night  If you need a refill on your cardiac medications before your next appointment, please call your pharmacy.    Thank you for choosing [facility] !

## 2015-03-13 NOTE — Progress Notes (Signed)
Cardiology Office Note  Date: 03/13/2015   ID: Bethany Wang, DOB December 13, 1965, MRN [id number]  PCP: Monico Blitz, MD  Primary Cardiologist: [name], MD   Chief Complaint  Patient presents with  . Hospitalization Follow-up  . Coronary Artery Disease    History of Present Illness:  Bethany Wang is a 49 y.o. female last seen in September. At that time Imdur 60 mg daily was moved to the morning from the evening, and otherwise medical therapy was continued. She has a history of recurring chest pain and was just recently admitted to [facility]. Records reviewed, she ruled out for myocardial infarction with normal troponin T levels, and was managed by Dr. Manuella Ghazi with no additional cardiac testing. I reviewed the records including her ECG and chest CTA report.  She comes in today with her husband for follow-up visit. She states that she has been under a significant amount of stress and worry, mainly with family and financial concerns. She plans to go out of town to spend time with her sister in about a week around her birthday and hopes that this will help. She describes pain in her chest that is not completely typical for angina, although she certainly has substrate for angina. She states that sometimes at night time it hurts on the left side of her chest and shoulder, but if she is able to shift on her right side and move her arm, the symptoms are better. She uses nitroglycerin with chest pain that occurs at other times of the day, but it is not always helpful.  We reviewed her medications which are outlined below. She reports compliance and states that she has been taking them at regular intervals.   Past Medical History  Diagnosis Date  . 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 Seizures ([place])   . Essential hypertension, benign   . Mixed hyperlipidemia   . GERD (gastroesophageal reflux disease)   . Suicide attempt ([place])     3 attempts in remote past  .  PUD (peptic ulcer disease)   . Coronary atherosclerosis of native coronary artery     DES to RCA 03/2012, DES to LAD at [facility] 05/2012  . Type 2 diabetes mellitus ([place])   . History of hematuria   . Bradycardia   . Cancer [facility])     Right Breast, Uterine    Current Outpatient Prescriptions  Medication Sig Dispense Refill  . albuterol (PROVENTIL HFA;VENTOLIN HFA) 108 (90 BASE) MCG/ACT inhaler Inhale 2 puffs into the lungs every 6 (six) hours as needed for wheezing or shortness of breath.    [name] amLODipine (NORVASC) 5 MG tablet Take 5 mg by mouth daily.     [name] aspirin 81 MG chewable tablet Chew 1 tablet (81 mg total) by mouth daily.    [name] atorvastatin (LIPITOR) 80 MG tablet Take 1 tablet (80 mg total) by mouth daily. 30 tablet 6  . diphenhydrAMINE (BENADRYL) 25 mg capsule Take 25 mg by mouth daily as needed for allergies.    [name] EPINEPHrine 0.3 mg/0.3 mL IJ SOAJ injection Inject 0.3 mg into the muscle once.    . escitalopram (LEXAPRO) 20 MG tablet Take 20 mg by mouth daily.    [name] gabapentin (NEURONTIN) 400 MG capsule Take 1 capsule by mouth daily.    [name] HYDROcodone-acetaminophen (NORCO) 10-325 MG per tablet Take 1 tablet by mouth every 4 (four) hours as needed for moderate pain.     [name]  Linaclotide (LINZESS) 145 MCG CAPS capsule Take 1 capsule (145 mcg total) by mouth daily. 30 capsule 5  . lisinopril-hydrochlorothiazide (PRINZIDE,ZESTORETIC) 20-25 MG per tablet Take 1 tablet by mouth daily.     . meloxicam (MOBIC) 15 MG tablet Take 15 mg by mouth daily.    . Naproxen Sodium 220 MG CAPS Take 1 capsule by mouth daily as needed (pain).    . nitroGLYCERIN (NITROSTAT) 0.4 MG SL tablet Place 1 tablet (0.4 mg total) under the tongue every 5 (five) minutes x 3 doses as needed for chest pain. If no relief after 3rd dose, proceed to the ED for an evaluation 25 tablet 3  . pantoprazole (PROTONIX) 40 MG tablet Take 1 tablet (40 mg total) by mouth 2 (two) times daily. 60 tablet 6  . tiZANidine (ZANAFLEX) 4 MG tablet  Take 4 mg by mouth every 8 (eight) hours as needed for muscle spasms.    [name] triamcinolone lotion (KENALOG) 0.1 % Apply 1 application topically as needed.     . zolpidem (AMBIEN) 10 MG tablet Take 10 mg by mouth at bedtime.     No current facility-administered medications for this visit.    Allergies:  Penicillins   Social History: The patient  reports that she quit smoking about 2 years ago. Her smoking use included Cigarettes. She started smoking about 31 years ago. She has a 6.4 pack-year smoking history. She has never used smokeless tobacco. She reports that she does not drink alcohol or use illicit drugs.   ROS:  Please see the history of present illness. Otherwise, complete review of systems is positive for anxiety and emotional stress.  All other systems are reviewed and negative.   Physical Exam: VS:  BP 138/82 mmHg  Pulse 70  Ht 5\' 4"  (1.626 m)  Wt 158 lb 6.4 oz (71.85 kg)  BMI 27.18 kg/m2  SpO2 99%, BMI Body mass index is 27.18 kg/(m^2).  Wt Readings from Last 3 Encounters:  03/13/15 158 lb 6.4 oz (71.85 kg)  12/17/14 157 lb 6.4 oz (71.396 kg)  08/22/14 155 lb 12.8 oz (70.67 kg)     Appears comfortable. No active chest pain. HEENT: Conjunctiva and lids normal, oropharynx clear.  Neck: Supple, no elevated JVP or carotid bruits, no thyromegaly.  Lungs: Clear to auscultation, nonlabored breathing at rest.  Cardiac: Regular rate and rhythm, no S3 or significant systolic murmur, no pericardial rub.  Abdomen: Soft, nontender, bowel sounds present.  Extremities: No pitting edema, distal pulses 2+.    ECG: Tracing from 03/06/2015 showed sinus bradycardia with increased voltage and nonspecific T-wave abnormalities.  Recent Labwork: 07/03/2014: BUN 6; Creatinine, Ser 0.76; Hemoglobin 13.0; Platelets 245; Potassium 3.6; Sodium 140     Component Value Date/Time   CHOL 186 05/21/2013 1155   TRIG 115 05/21/2013 1155   HDL 40 05/21/2013 1155   CHOLHDL 4.7 05/21/2013 1155    VLDL 23 05/21/2013 1155   LDLCALC 123* 05/21/2013 1155    Other Studies Reviewed Today:  She had a cardiac catheterization done in March due to progressive chest pain symptoms demonstrating chronic total occlusion of the proximal RCA at prior stent site with good collateralization from both the circumflex and the LAD, otherwise widely patent LAD stent site and 50% distal circumflex stenosis. Medical therapy was recommended by Dr. Tamala Julian and a long-acting nitrate was added.  Chest CT[facility] from [facility] on 03/06/2015 reported no pulmonary embolus, coronary calcifications within the LAD and RCA distributions, no pleural effusion, stable left thyroid nodule.  ASSESSMENT AND PLAN:  1. Recurrent chest pain with atypical features and associated with emotional stress and anxiety. She does have known CAD as detailed above and we plan to continue medical therapy for treatment of angina as well. Recent hospital evaluation in [place] noted with normal troponin I levels, no clear evidence of ACS despite prolonged symptoms. Increase Imdur to 60 mg in the morning and 30 g in evening, otherwise continue present regimen. Follow-up arranged.  2. CAD status post DES to the RCA in December 2013 as well as DES to the LAD in February 2014. RCA stent is known to be occluded with good collaterals as discussed above. Plan will be to continue medical therapy for now.  Current medicines were reviewed at length with the patient today.  Disposition: FU with me in 3 months.   Signed, Satira Sark, MD, [facility] 03/13/2015 3:12 PM    [place] at [facility] [address] [place], [place] [address] Phone: [phone]; Fax: [phone]

## 2015-06-16 ENCOUNTER — Ambulatory Visit (INDEPENDENT_AMBULATORY_CARE_PROVIDER_SITE_OTHER): Payer: Medicare Other | Admitting: Cardiology

## 2015-06-16 ENCOUNTER — Encounter: Payer: Self-pay | Admitting: Cardiology

## 2015-06-16 VITALS — BP 122/92 | HR 84 | Ht 64.0 in | Wt 156.0 lb

## 2015-06-16 DIAGNOSIS — I10 Essential (primary) hypertension: Secondary | ICD-10-CM

## 2015-06-16 DIAGNOSIS — I25119 Atherosclerotic heart disease of native coronary artery with unspecified angina pectoris: Secondary | ICD-10-CM | POA: Diagnosis not present

## 2015-06-16 MED ORDER — ISOSORBIDE MONONITRATE ER 60 MG PO TB24: 60.0000 mg | ORAL_TABLET | Freq: Two times a day (BID) | ORAL | Status: DC

## 2015-06-16 NOTE — Progress Notes (Signed)
Cardiology Office Note  Date: 06/16/2015   ID: Bethany Wang, DOB 19-Mar-1966, MRN [id number]  PCP: Monico Blitz, MD  Primary Cardiologist: [name], MD   Chief Complaint  Patient presents with  . Coronary Artery Disease    History of Present Illness: Bethany Wang is a 50 y.o. female last seen in December 2016.she is here today with her husband for a routine follow-up visit. She reports recurring chest pain symptoms as before, usually tend to be toward the end of the day or the evening. Not all symptoms are typical for angina. She does use nitroglycerin at times without much benefit. Other times it does seem to help. Some symptoms seem to be related to stress and anxiety. She does mention that she took a trip to see her sister in [place] after our last visit and during that time did very well with low stress levels.  We went over her medications today. Cardiac regimen includes aspirin, Norvasc, Lipitor, Imdur, lisinopril, and nitroglycerin.  Past Medical History  Diagnosis Date  . 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 Seizures ([place])   . Essential hypertension, benign   . Mixed hyperlipidemia   . GERD (gastroesophageal reflux disease)   . Suicide attempt ([place])     3 attempts in remote past  . PUD (peptic ulcer disease)   . Coronary atherosclerosis of native coronary artery     DES to RCA 03/2012, DES to LAD at [facility] 05/2012  . Type 2 diabetes mellitus ([place])   . History of hematuria   . Bradycardia   . Breast cancer, right breast ([place])   . Uterine cancer [facility])     Current Outpatient Prescriptions  Medication Sig Dispense Refill  . albuterol (PROVENTIL HFA;VENTOLIN HFA) 108 (90 BASE) MCG/ACT inhaler Inhale 2 puffs into the lungs every 6 (six) hours as needed for wheezing or shortness of breath.    [name] amLODipine (NORVASC) 5 MG tablet Take 5 mg by mouth daily.     [name] aspirin 81 MG chewable tablet Chew 1 tablet (81 mg total) by mouth  daily.    [name] atorvastatin (LIPITOR) 80 MG tablet Take 1 tablet (80 mg total) by mouth daily. 30 tablet 6  . BACLOFEN PO Take 10 mg by mouth as needed.    . diazepam (VALIUM) 10 MG tablet Take 10 mg by mouth every 6 (six) hours as needed for anxiety.    . diphenhydrAMINE (BENADRYL) 25 mg capsule Take 25 mg by mouth daily as needed for allergies.    . DULoxetine (CYMBALTA) 60 MG capsule Take 60 mg by mouth daily.    [name] EPINEPHrine 0.3 mg/0.3 mL IJ SOAJ injection Inject 0.3 mg into the muscle once.    . escitalopram (LEXAPRO) 20 MG tablet Take 20 mg by mouth daily.    [name] etodolac (LODINE) 400 MG tablet Take 400 mg by mouth 2 (two) times daily.    [name] gabapentin (NEURONTIN) 300 MG capsule Take 300 mg by mouth 3 (three) times daily.    [name] HYDROcodone-acetaminophen (NORCO) 10-325 MG per tablet Take 1 tablet by mouth every 4 (four) hours as needed for moderate pain.     [name] HYDROXYZINE PAMOATE PO Take 25 mg by mouth 3 (three) times daily.    . isosorbide mononitrate (IMDUR) 30 MG 24 hr tablet Take 30 mg by mouth daily.    . Linaclotide (LINZESS) 145 MCG CAPS capsule Take 1 capsule (145 mcg total) by  mouth daily. 30 capsule 5  . lisinopril-hydrochlorothiazide (PRINZIDE,ZESTORETIC) 20-25 MG per tablet Take 1 tablet by mouth daily.     . meloxicam (MOBIC) 15 MG tablet Take 15 mg by mouth daily.    . Naproxen Sodium 220 MG CAPS Take 1 capsule by mouth daily as needed (pain).    . nitroGLYCERIN (NITROSTAT) 0.4 MG SL tablet Place 1 tablet (0.4 mg total) under the tongue every 5 (five) minutes x 3 doses as needed for chest pain. If no relief after 3rd dose, proceed to the ED for an evaluation 25 tablet 3  . oseltamivir (TAMIFLU) 75 MG capsule Take 75 mg by mouth 2 (two) times daily.    . pantoprazole (PROTONIX) 40 MG tablet Take 40 mg by mouth daily.    [name] triamcinolone lotion (KENALOG) 0.1 % Apply 1 application topically as needed.     . zolpidem (AMBIEN) 10 MG tablet Take 10 mg by mouth at bedtime.     No current  facility-administered medications for this visit.   Allergies:  Penicillins   Social History: The patient  reports that she quit smoking about 3 years ago. Her smoking use included Cigarettes. She started smoking about 31 years ago. She has a 6.4 pack-year smoking history. She has never used smokeless tobacco. She reports that she does not drink alcohol or use illicit drugs.   ROS:  Please see the history of present illness. Otherwise, complete review of systems is positive for stress and anxiety, intermittent headaches.  All other systems are reviewed and negative.   Physical Exam: VS:  BP 122/92 mmHg  Pulse 84  Ht 5\' 4"  (1.626 m)  Wt 156 lb (70.761 kg)  BMI 26.76 kg/m2  SpO2 98%, BMI Body mass index is 26.76 kg/(m^2).  Wt Readings from Last 3 Encounters:  06/16/15 156 lb (70.761 kg)  03/13/15 158 lb 6.4 oz (71.85 kg)  12/17/14 157 lb 6.4 oz (71.396 kg)    Appears comfortable. No active chest pain. HEENT: Conjunctiva and lids normal, oropharynx clear.  Neck: Supple, no elevated JVP or carotid bruits, no thyromegaly.  Lungs: Clear to auscultation, nonlabored breathing at rest.  Cardiac: Regular rate and rhythm, no S3 or significant systolic murmur, no pericardial rub.  Abdomen: Soft, nontender, bowel sounds present.  Extremities: No pitting edema, distal pulses 2+.   ECG:  I personally reviewed the prior tracing from 03/05/2015 which showed sinus rhythm with increased voltage and nonspecific T-wave changes.  Recent Labwork: 07/03/2014: BUN 6; Creatinine, Ser 0.76; Hemoglobin 13.0; Platelets 245; Potassium 3.6; Sodium 140  November 2016: BUN 5, creatinine 0.7, AST 16, ALT 9, potassium 3.4, hemoglobin 12.8, platelets 220   Other Studies Reviewed Today:  She had a cardiac catheterization done in March 2016 due to progressive chest pain symptoms demonstrating chronic total occlusion of the proximal RCA at prior stent site with good collateralization from both the circumflex and  the LAD, otherwise widely patent LAD stent site and 50% distal circumflex stenosis. Medical therapy was recommended by Dr. Tamala Julian and a long-acting nitrate was added.  Chest CTA from [facility] on 03/06/2015 reported no pulmonary embolus, coronary calcifications within the LAD and RCA distributions, no pleural effusion, stable left thyroid nodule.  Assessment and Plan:  1. Recurring chest pain with typical and atypical features. Difficult management situation. She has known CAD with chronic total occlusion of the proximal RCA at previous stent site associated with good collaterals from the circumflex and LAD, otherwise widely patent LAD stent with 50% distal  circumflex. Plan is to continue medical therapy, increase Imdur to 60 mg twice daily. We did talk about the possibility of adding Ranexa as well.  2. Essential hypertension, continue medical therapy.  Current medicines were reviewed with the patient today.  Disposition: FU with me in 3 months.   Signed, Satira Sark, MD, [facility] 06/16/2015 10:25 AM    [place] at [place], [place], [place] [address] Phone: [phone]; Fax: [phone]

## 2015-06-16 NOTE — Patient Instructions (Signed)
   Increase Imdur to 60mg  twice a day  - new sent to [facility] today. Continue all other medications.   Follow up in  3 months.

## 2015-06-25 ENCOUNTER — Encounter: Payer: Self-pay | Admitting: Diagnostic Neuroimaging

## 2015-06-25 ENCOUNTER — Ambulatory Visit (INDEPENDENT_AMBULATORY_CARE_PROVIDER_SITE_OTHER): Payer: Medicare Other | Admitting: Diagnostic Neuroimaging

## 2015-06-25 VITALS — BP 164/93 | HR 70 | Ht 64.0 in | Wt 161.0 lb

## 2015-06-25 DIAGNOSIS — R55 Syncope and collapse: Secondary | ICD-10-CM

## 2015-06-25 DIAGNOSIS — G450 Vertebro-basilar artery syndrome: Secondary | ICD-10-CM

## 2015-06-25 NOTE — Progress Notes (Signed)
[facility]  PATIENT: Bethany Wang DOB: 24-Jan-1966  REFERRING CLINICIAN: Manuella Ghazi HISTORY FROM: patient and husband REASON FOR VISIT: follow up   HISTORICAL  CHIEF COMPLAINT:  Chief Complaint  Patient presents with  . Syncope and collapse    rm 6, "falls x 3-4 due to collapse; need stent in left neck for blockages"  . Follow-up    last seen 03/2014    HISTORY OF PRESENT ILLNESS:   UPDATE 06/25/15: Since last visit, continues to have episodes of syncope (4 since 2015). First feels sweaty, then LOC. Then wakes up with nausea, headache, pounding, left eye pain, ringing in ears, photophobia, then left arm numbness.  UPDATE 02/13/14: Since last visit, patient here for multiple syncope events. Has had 3 events in the last 2 months. Symptoms start with neck pain, then feeling sweaty, hot sensation, sounds feel like they are in an echo chamber, and then patient collapsed to the ground. No convulsions. Patient is unconscious for 5-10 minutes at a time. She then is able to wake up, with energy drained feeling, sweaty hot feeling. No convulsions. No tongue biting or incontinence. Patient has had some double vision with some of these events.  PRIOR HPI (01/21/12): 50 year old right-handed female with history of hypertension, diabetes, here for evaluation of migraine. Patient reports history of intermittent severe headaches since childhood. Headaches typically start with nausea and vomiting, sensitivity sound, followed by severe frontal headaches. She's been on Friday medications over the years. Nowadays she has to severe migraine days per month. She's tried Topamax but this caused a rash. She's been on amitriptyline since that time to help with migraine control. She also has significant stress in her life with anger issues, and is on Cymbalta, venlafaxine and Depakote. With some of these severe headaches, patient has had episodes where she passes out, falls to the ground and shakes.  To the emergency room for evaluation the past. She's also noted by another neurologist, who did EEG, and diagnosed her with nonepileptic spells.   REVIEW OF SYSTEMS: Full 14 system review of systems performed and negative except for fatigue pain headache passing out ringing in ears.    ALLERGIES: Allergies  Allergen Reactions  . Penicillins Anaphylaxis    Patient states she carries an epi-pen    HOME MEDICATIONS: Outpatient Prescriptions Prior to Visit  Medication Sig Dispense Refill  . albuterol (PROVENTIL HFA;VENTOLIN HFA) 108 (90 BASE) MCG/ACT inhaler Inhale 2 puffs into the lungs every 6 (six) hours as needed for wheezing or shortness of breath.    [name] amLODipine (NORVASC) 5 MG tablet Take 5 mg by mouth daily.     [name] aspirin 81 MG chewable tablet Chew 1 tablet (81 mg total) by mouth daily.    [name] atorvastatin (LIPITOR) 80 MG tablet Take 1 tablet (80 mg total) by mouth daily. 30 tablet 6  . BACLOFEN PO Take 10 mg by mouth as needed.    . diazepam (VALIUM) 10 MG tablet Take 10 mg by mouth every 6 (six) hours as needed for anxiety.    . diphenhydrAMINE (BENADRYL) 25 mg capsule Take 25 mg by mouth daily as needed for allergies.    . DULoxetine (CYMBALTA) 60 MG capsule Take 60 mg by mouth daily.    [name] EPINEPHrine 0.3 mg/0.3 mL IJ SOAJ injection Inject 0.3 mg into the muscle once.    . etodolac (LODINE) 400 MG tablet Take 400 mg by mouth 2 (two) times daily.    [name] gabapentin (NEURONTIN) 300 MG  capsule Take 300 mg by mouth 3 (three) times daily.    [name] HYDROXYZINE PAMOATE PO Take 25 mg by mouth 3 (three) times daily.    . isosorbide mononitrate (IMDUR) 60 MG 24 hr tablet Take 1 tablet (60 mg total) by mouth 2 (two) times daily. 60 tablet 6  . Linaclotide (LINZESS) 145 MCG CAPS capsule Take 1 capsule (145 mcg total) by mouth daily. 30 capsule 5  . lisinopril-hydrochlorothiazide (PRINZIDE,ZESTORETIC) 20-25 MG per tablet Take 1 tablet by mouth daily.     . meloxicam (MOBIC) 15 MG tablet Take 15 mg by  mouth daily.    . Naproxen Sodium 220 MG CAPS Take 1 capsule by mouth daily as needed (pain).    . nitroGLYCERIN (NITROSTAT) 0.4 MG SL tablet Place 1 tablet (0.4 mg total) under the tongue every 5 (five) minutes x 3 doses as needed for chest pain. If no relief after 3rd dose, proceed to the ED for an evaluation 25 tablet 3  . pantoprazole (PROTONIX) 40 MG tablet Take 40 mg by mouth daily.    [name] triamcinolone lotion (KENALOG) 0.1 % Apply 1 application topically as needed.     . zolpidem (AMBIEN) 10 MG tablet Take 10 mg by mouth at bedtime.    [name] escitalopram (LEXAPRO) 20 MG tablet Take 20 mg by mouth daily.    [name] HYDROcodone-acetaminophen (NORCO) 10-325 MG per tablet Take 1 tablet by mouth every 4 (four) hours as needed for moderate pain.     [name] oseltamivir (TAMIFLU) 75 MG capsule Take 75 mg by mouth 2 (two) times daily.     No facility-administered medications prior to visit.    PAST MEDICAL HISTORY: Past Medical History  Diagnosis Date  . 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 Seizures ([place])   . Essential hypertension, benign   . Mixed hyperlipidemia   . GERD (gastroesophageal reflux disease)   . Suicide attempt (Bethany Wang)     3 attempts in remote past  . PUD (peptic ulcer disease)   . Coronary atherosclerosis of native coronary artery     DES to RCA 03/2012, DES [facility] LAD [facility] 05/2012  . Type 2 diabetes mellitus (Bethany Wang)   . History of hematuria   . Bradycardia   . Breast cancer, right breast (Bethany Wang)   . Uterine cancer (Bethany Wang)   . Falls     x 3-4 in past year    PAST SURGICAL HISTORY: Past Surgical History  Procedure Laterality Date  . Abdominal hysterectomy    . Bladder surgery    . Shoulder surgery Left   . Hand surgery Right   . Eye surgery      Removed glass  . Left breast lumpectomy      Benign  . Colonoscopy  May 2010    [place]: normal rectum, internal hemorrhoids, , benign colonic polyp  . Esophagogastroduodenoscopy      2001 Dr. Amedeo Plenty: distal  esophagitis, small hiatal hernia,. Dr. Tamala Julian 2006? no records available currently, pt also reports  EGD a few years ago with Dr. Gala Romney, do not have these reports anywhere in medical records  . Esophagogastroduodenoscopy  06/02/2011    [id number] pill impaction as described above s/p dilation of a probable cervical esophageal web/bx abnormal esophageal and gastric mucosa. + H.pylori gastritis   . Left heart catheterization with coronary angiogram N/A 07/03/2014    Procedure: LEFT HEART CATHETERIZATION WITH CORONARY ANGIOGRAM;  Surgeon: Belva Crome, MD;  Location: [facility]  CATH LAB;  Service: Cardiovascular;  Laterality: N/A;    FAMILY HISTORY: Family History  Problem Relation Age of Onset  . Colon cancer Paternal Grandfather   . Cancer Father   . Cancer Maternal Uncle   . Alzheimer's disease Paternal Aunt   . Cirrhosis Maternal Uncle   . Cancer Paternal Aunt     lung  . Heart disease Brother   . Heart attack Brother   . Mental illness Daughter   . ADD / ADHD Daughter   . Bipolar disorder Daughter   . Mental illness Son   . ADD / ADHD Son   . Bipolar disorder Son   . Mental illness Son   . ADD / ADHD Son   . Bipolar disorder Son     SOCIAL HISTORY:  Social History   Social History  . Marital Status: Single    Spouse Name: N/A  . Number of Children: 3  . Years of Education: Assoc   Occupational History  . Retired     disability   Social History Main Topics  . Smoking status: Former Smoker -- 0.20 packs/day for 32 years    Types: Cigarettes    Start date: 07/05/1983    Quit date: 04/03/2012  . Smokeless tobacco: Never Used  . Alcohol Use: No  . Drug Use: No  . Sexual Activity: Yes    Birth Control/ Protection: Surgical   Other Topics Concern  . Not on file   Social History Narrative   Patient lives at home with her fiance.   Caffeine Use: 3 cups daily     PHYSICAL EXAM  Filed Vitals:   06/25/15 1354  BP: 164/93  Pulse: 70  Height: 5\' 4"  (1.626 m)  Weight:  161 lb (73.029 kg)    Not recorded     No exam data present   Body mass index is 27.62 kg/(m^2).  GENERAL EXAM: Patient is in no distress; well developed, nourished and groomed; neck is supple  CARDIOVASCULAR: Regular rate and rhythm, no murmurs, no carotid bruits  NEUROLOGIC: MENTAL STATUS: awake, alert, language fluent, comprehension intact, naming intact, fund of knowledge appropriate CRANIAL NERVE: no papilledema on fundoscopic exam, pupils equal and reactive to light, visual fields full to confrontation, extraocular muscles intact, no nystagmus, facial sensation and strength symmetric, hearing intact, palate elevates symmetrically, uvula midline, shoulder shrug symmetric, tongue midline. MOTOR: normal bulk and tone, full strength in the BUE, BLE SENSORY: normal and symmetric to light touch; ABSENT VIB AT KNEES, ANKLES, TOES COORDINATION: finger-nose-finger, fine finger movements normal REFLEXES: deep tendon reflexes present and symmetric GAIT/STATION: narrow based gait; able to walk tandem; romberg is negative    DIAGNOSTIC DATA (LABS, IMAGING, TESTING) - I reviewed patient records, labs, notes, testing and imaging myself where available.  Lab Results  Component Value Date   WBC 6.4 07/03/2014   HGB 13.0 07/03/2014   HCT 38.9 07/03/2014   MCV 85.7 07/03/2014   PLT 245 07/03/2014      Component Value Date/Time   NA 140 07/03/2014 1100   K 3.6 07/03/2014 1100   CL 102 07/03/2014 1100   CO2 32 07/03/2014 1100   GLUCOSE 102* 07/03/2014 1100   BUN 6 07/03/2014 1100   CREATININE 0.76 07/03/2014 1100   CALCIUM 9.8 07/03/2014 1100   PROT 6.4 05/21/2013 1155   ALBUMIN 3.8 05/21/2013 1155   AST 22 05/21/2013 1155   ALT 19 05/21/2013 1155   ALKPHOS 118* 05/21/2013 1155   BILITOT 0.4 05/21/2013  1155   GFRNONAA >90 07/03/2014 1100   GFRAA >90 07/03/2014 1100   Lab Results  Component Value Date   CHOL 186 05/21/2013   HDL 40 05/21/2013   LDLCALC 123* 05/21/2013    TRIG 115 05/21/2013   CHOLHDL 4.7 05/21/2013   Lab Results  Component Value Date   HGBA1C 5.5 04/03/2012   No results found for: VITAMINB12 No results found for: TSH   08/14/05 MRI brain - Normal brain. Tornwaldt cyst of the nasopharynx.   03/11/14 MRI brain (without) demonstrating: 1. Minimal periventricular and subcortical foci of non-specific gliosis. Could be related to chronic small vessel ischemic disease. 2. No acute findings.  03/11/14 MRA head - normal  03/11/14 MRA neck (with and without): 1. The right vertebral artery is slightly irregular proximally with 30% focal stenosis; however there is robust flow signal superiorly up to the vertebrobasilar junction. 2. The left vertebral artery is slightly irregular proximally without focal stenosis; there is robust flow signal superiorly up to the vertebrobasilar junction.  3. The left internal carotid artery has 20% stenosis at the carotid bulb due to smooth plaque.     ASSESSMENT AND PLAN  50 y.o. year old female here with history of migraine, non-epileptic spells, now with new onset of multiple syncope events in last 2 months. These new events do not sound like seizure. Could be cardiogenic syncope or vertebrobasilar TIA. MRI brain and MRA head unremarkable. She does have some atherosclerosis in the neck (right verterbal 30%, left vertebral irregular, left ICA 20%), but I do not this she is symptomatic from these, and I would recommend medical management of risk factors. Now with at least 4 more syncope events since 2015.   Ddx of passing out spells: cardiogenic syncope, neurogenic syncope, vertebrobasilar insufficiency, basilar migraine  Syncope and collapse - Plan: MR Brain Wo Contrast, MR MRA HEAD WO CONTRAST, MR Angiogram Neck W Wo Contrast  Vertebrobasilar artery syndrome - Plan: MR Brain Wo Contrast, MR MRA HEAD WO CONTRAST, MR Angiogram Neck W Wo Contrast    PLAN: - continue smoking abstinence - continue  aspirin - continue BP control - repeat MRI, MRA scans to see if atherosclerosis has progressed - check EEG for seizure evaluation - no driving until event free x 6 months  Orders Placed This Encounter  Procedures  . MR Brain Wo Contrast  . MR MRA HEAD WO CONTRAST  . MR Angiogram Neck W Wo Contrast  . EEG adult   Return in about 6 weeks (around 08/06/2015).    Penni Bombard, MD [id number], 0000000 PM Certified in Neurology, Neurophysiology and Neuroimaging  [facility] [address] [place], [place] [address] [phone]

## 2015-06-25 NOTE — Patient Instructions (Signed)
Thank you for coming to see [place] at [facility]. I hope we have been able to provide you high quality care today.  You may receive a patient satisfaction survey over the next few weeks. We would appreciate your feedback and comments so that we may continue to improve ourselves and the health of our patients.  - I will check MRI and MRA scans - continue current medications   ~~~~~~~~~~~~~~~~~~~~~~~~~~~~~~~~~~~~~~~~~~~~~~~~~~~~~~~~~~~~~~~~~  DR. Carol Loftin'S GUIDE TO HAPPY AND HEALTHY LIVING These are some of my general health and wellness recommendations. Some of them may apply to you better than others. Please use common sense as you try these suggestions and feel free to ask me any questions.   ACTIVITY/FITNESS Mental, social, emotional and physical stimulation are very important for brain and body health. Try learning a new activity (arts, music, language, sports, games).  Keep moving your body to the best of your abilities. You can do this at home, inside or outside, the park, community center, gym or anywhere you like. Consider a physical therapist or personal trainer to get started. Consider the app Sworkit. Fitness trackers such as smart-watches, smart-phones or Fitbits can help as well.   NUTRITION Eat more plants: colorful vegetables, nuts, seeds and berries.  Eat less sugar, salt, preservatives and processed foods.  Avoid toxins such as cigarettes and alcohol.  Drink water when you are thirsty. Warm water with a slice of lemon is an excellent morning drink to start the day.  Consider these websites for more information The Nutrition Source (https://www.henry-hernandez.biz/) Precision Nutrition (WindowBlog.ch)   RELAXATION Consider practicing mindfulness meditation or other relaxation techniques such as deep breathing, prayer, yoga, tai chi, massage. See website mindful.org or the apps Headspace or Calm to help get  started.   SLEEP Try to get at least 7-8+ hours sleep per day. Regular exercise and reduced caffeine will help you sleep better. Practice good sleep hygeine techniques. See website sleep.org for more information.   PLANNING Prepare estate planning, living will, healthcare POA documents. Sometimes this is best planned with the help of an attorney. Theconversationproject.org and agingwithdignity.org are excellent resources.

## 2015-07-07 ENCOUNTER — Other Ambulatory Visit: Payer: Medicare Other

## 2015-07-07 ENCOUNTER — Inpatient Hospital Stay: Admission: RE | Admit: 2015-07-07 | Payer: Medicare Other | Source: Ambulatory Visit

## 2015-07-11 ENCOUNTER — Ambulatory Visit (INDEPENDENT_AMBULATORY_CARE_PROVIDER_SITE_OTHER): Payer: Medicare Other | Admitting: Diagnostic Neuroimaging

## 2015-07-11 DIAGNOSIS — G450 Vertebro-basilar artery syndrome: Secondary | ICD-10-CM

## 2015-07-11 DIAGNOSIS — R55 Syncope and collapse: Secondary | ICD-10-CM

## 2015-07-11 NOTE — Procedures (Signed)
   [facility]  EEG (ELECTROENCEPHALOGRAM) REPORT   STUDY DATE: 07/11/15 PATIENT NAME: Bethany Wang DOB: 1965/05/01 MRN: [id number]  ORDERING CLINICIAN: Andrey Spearman, MD   TECHNOLOGIST: Laretta Alstrom  TECHNIQUE: Electroencephalogram was recorded utilizing standard 10-20 system of lead placement and reformatted into average and bipolar montages.  RECORDING TIME: 29 minutes ACTIVATION: hyperventilation and photic stimulation  CLINICAL INFORMATION: 50 year old female with syncope vs seizure.  FINDINGS: Background rhythms of 12-13 hertz and 10-15 microvolts. No focal, lateralizing, epileptiform activity or seizures are seen. Patient recorded in the awake state. EKG channel shows 60-70 beats per minute.   IMPRESSION:  Normal EEG in the awake state.    INTERPRETING PHYSICIAN:  Penni Bombard, MD Certified in Neurology, Neurophysiology and Neuroimaging  [facility] [address] [place], [place] [address] [phone]

## 2015-07-14 ENCOUNTER — Telehealth: Payer: Self-pay | Admitting: *Deleted

## 2015-07-14 NOTE — Telephone Encounter (Signed)
Spoke with patient and informed her that EEG results are normal. Informed her she will get a result call following MRI's.  She verbalized understanding, appreciation.

## 2015-07-16 ENCOUNTER — Ambulatory Visit
Admission: RE | Admit: 2015-07-16 | Discharge: 2015-07-16 | Disposition: A | Discharge status: Home / Self Care | Payer: Medicare Other | Source: Ambulatory Visit | Attending: Diagnostic Neuroimaging | Admitting: Diagnostic Neuroimaging

## 2015-07-16 DIAGNOSIS — G450 Vertebro-basilar artery syndrome: Secondary | ICD-10-CM

## 2015-07-16 DIAGNOSIS — R55 Syncope and collapse: Secondary | ICD-10-CM

## 2015-07-16 MED ORDER — GADOBENATE DIMEGLUMINE 529 MG/ML IV SOLN
15.0000 mL | Freq: Once | INTRAVENOUS | Status: AC | PRN
  Administered 2015-07-16: 15 mL via INTRAVENOUS

## 2015-08-06 ENCOUNTER — Ambulatory Visit: Payer: Medicare Other | Admitting: Diagnostic Neuroimaging

## 2015-08-08 ENCOUNTER — Ambulatory Visit (INDEPENDENT_AMBULATORY_CARE_PROVIDER_SITE_OTHER): Payer: Medicare Other | Admitting: Diagnostic Neuroimaging

## 2015-08-08 ENCOUNTER — Encounter: Payer: Self-pay | Admitting: Diagnostic Neuroimaging

## 2015-08-08 VITALS — BP 118/86 | HR 72 | Resp 16 | Wt 161.4 lb

## 2015-08-08 DIAGNOSIS — D509 Iron deficiency anemia, unspecified: Secondary | ICD-10-CM | POA: Insufficient documentation

## 2015-08-08 DIAGNOSIS — G4700 Insomnia, unspecified: Secondary | ICD-10-CM | POA: Diagnosis not present

## 2015-08-08 DIAGNOSIS — M545 Low back pain, unspecified: Secondary | ICD-10-CM | POA: Insufficient documentation

## 2015-08-08 DIAGNOSIS — F5089 Other specified eating disorder: Secondary | ICD-10-CM | POA: Insufficient documentation

## 2015-08-08 DIAGNOSIS — G43101 Migraine with aura, not intractable, with status migrainosus: Secondary | ICD-10-CM | POA: Diagnosis not present

## 2015-08-08 DIAGNOSIS — F410 Panic disorder [episodic paroxysmal anxiety] without agoraphobia: Secondary | ICD-10-CM

## 2015-08-08 DIAGNOSIS — F411 Generalized anxiety disorder: Secondary | ICD-10-CM | POA: Diagnosis not present

## 2015-08-08 DIAGNOSIS — G8929 Other chronic pain: Secondary | ICD-10-CM | POA: Diagnosis not present

## 2015-08-08 DIAGNOSIS — R55 Syncope and collapse: Secondary | ICD-10-CM

## 2015-08-08 MED ORDER — TOPIRAMATE 50 MG PO TABS: 50.0000 mg | ORAL_TABLET | Freq: Two times a day (BID) | ORAL | Status: DC

## 2015-08-08 NOTE — Progress Notes (Signed)
[facility]  PATIENT: Bethany Wang DOB: 05/13/1965  REFERRING CLINICIAN: Manuella Ghazi HISTORY FROM: patient and husband REASON FOR VISIT: follow up   HISTORICAL  CHIEF COMPLAINT:  Chief Complaint  Patient presents with  . Loss of Consciousness    Denies further syncopal episodes.  Here today to discuss results of MRI/MRA brain, neck, and EEG./fim    HISTORY OF PRESENT ILLNESS:   UPDATE 08/08/15: Since last visit, no syncope events. Continues with headaches (3-4 per week). Also reports significant pica symptoms (ice and red dirt) and has history of iron deficiency anemia. Has tried   UPDATE 06/25/15: Since last visit, continues to have episodes of syncope (4 since 2015). First feels sweaty, then LOC. Then wakes up with nausea, headache, pounding, left eye pain, ringing in ears, photophobia, then left arm numbness.  UPDATE 02/13/14: Since last visit, patient here for multiple syncope events. Has had 3 events in the last 2 months. Symptoms start with neck pain, then feeling sweaty, hot sensation, sounds feel like they are in an echo chamber, and then patient collapsed to the ground. No convulsions. Patient is unconscious for 5-10 minutes at a time. She then is able to wake up, with energy drained feeling, sweaty hot feeling. No convulsions. No tongue biting or incontinence. Patient has had some double vision with some of these events.  PRIOR HPI (01/21/12): 50 year old right-handed femalemale with history of hypertension, diabetes, here for evaluation of migraine. Patient reports history of intermittent severe headaches since childhood. Headaches typically start with nausea and vomiting, sensitivity sound, followed by severe frontal headaches. She's been on Friday medications over the years. Nowadays she has to severe migraine days per month. She's tried Topamax but this caused a rash. She's been on amitriptyline since that time to help with migraine control. She also has  significant stress in her life with anger issues, and is on Cymbalta, venlafaxine and Depakote. With some of these severe headaches, patient has had episodes where she passes out, falls to the ground and shakes. To the emergency room for evaluation the past. She's also seen by another neurologist, who did EEG, and diagnosed her with nonepileptic spells.   REVIEW OF SYSTEMS: Full 14 system review of systems performed and negative except for fatigue pain headache ringing in ears fatigue insomnia.    ALLERGIES: Allergies  Allergen Reactions  . Penicillins Anaphylaxis    Patient states she carries an epi-pen    HOME MEDICATIONS: Outpatient Prescriptions Prior to Visit  Medication Sig Dispense Refill  . albuterol (PROVENTIL HFA;VENTOLIN HFA) 108 (90 BASE) MCG/ACT inhaler Inhale 2 puffs into the lungs every 6 (six) hours as needed for wheezing or shortness of breath.    [name] amLODipine (NORVASC) 5 MG tablet Take 5 mg by mouth daily.     [name] aspirin 81 MG chewable tablet Chew 1 tablet (81 mg total) by mouth daily.    [name] atorvastatin (LIPITOR) 80 MG tablet Take 1 tablet (80 mg total) by mouth daily. 30 tablet 6  . BACLOFEN PO Take 10 mg by mouth as needed.    . diazepam (VALIUM) 10 MG tablet Take 10 mg by mouth every 6 (six) hours as needed for anxiety.    . diphenhydrAMINE (BENADRYL) 25 mg capsule Take 25 mg by mouth daily as needed for allergies.    . DULoxetine (CYMBALTA) 60 MG capsule Take 60 mg by mouth daily.    [name] EPINEPHrine 0.3 mg/0.3 mL IJ SOAJ injection Inject 0.3 mg into the muscle once.    [name]  etodolac (LODINE) 400 MG tablet Take 400 mg by mouth 2 (two) times daily.    [name] gabapentin (NEURONTIN) 300 MG capsule Take 300 mg by mouth 3 (three) times daily.    [name] HYDROXYZINE PAMOATE PO Take 25 mg by mouth 3 (three) times daily.    . isosorbide mononitrate (IMDUR) 60 MG 24 hr tablet Take 1 tablet (60 mg total) by mouth 2 (two) times daily. 60 tablet 6  . Linaclotide (LINZESS) 145 MCG CAPS capsule  Take 1 capsule (145 mcg total) by mouth daily. 30 capsule 5  . lisinopril-hydrochlorothiazide (PRINZIDE,ZESTORETIC) 20-25 MG per tablet Take 1 tablet by mouth daily.     . meloxicam (MOBIC) 15 MG tablet Take 15 mg by mouth daily.    . Naproxen Sodium 220 MG CAPS Take 1 capsule by mouth daily as needed (pain).    . nitroGLYCERIN (NITROSTAT) 0.4 MG SL tablet Place 1 tablet (0.4 mg total) under the tongue every 5 (five) minutes x 3 doses as needed for chest pain. If no relief after 3rd dose, proceed to the ED for an evaluation 25 tablet 3  . oxyCODONE-acetaminophen (PERCOCET) 10-325 MG tablet Take 1 tablet by mouth every 4 (four) hours as needed for pain.    . pantoprazole (PROTONIX) 40 MG tablet Take 40 mg by mouth daily.    [name] triamcinolone lotion (KENALOG) 0.1 % Apply 1 application topically as needed.     . zolpidem (AMBIEN) 10 MG tablet Take 10 mg by mouth at bedtime.     No facility-administered medications prior to visit.    PAST MEDICAL HISTORY: Past Medical History  Diagnosis Date  . 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 Seizures ([place])   . Essential hypertension, benign   . Mixed hyperlipidemia   . GERD (gastroesophageal reflux disease)   . Suicide attempt ([place])     3 attempts in remote past  . PUD (peptic ulcer disease)   . Coronary atherosclerosis of native coronary artery     DES to RCA 03/2012, DES to LAD at [facility] 05/2012  . Type 2 diabetes mellitus ([place] [address])   . History of hematuria   . Bradycardia   . Breast cancer, right breast ([place])   . Uterine cancer ([place])   . Falls     x 3-4 in past year    PAST SURGICAL HISTORY: Past Surgical History  Procedure Laterality Date  . Abdominal hysterectomy    . Bladder surgery    . Shoulder surgery Left   . Hand surgery Right   . Eye surgery      Removed glass  . Left breast lumpectomy      Benign  . Colonoscopy  May 2010    [place]: normal rectum, internal hemorrhoids, , benign colonic polyp  .  Esophagogastroduodenoscopy      2001 Dr. Amedeo Plenty: distal esophagitis, small hiatal hernia,. Dr. Tamala Julian 2006? no records available currently, pt also reports  EGD a few years ago with Dr. Gala Romney, do not have these reports anywhere in medical records  . Esophagogastroduodenoscopy  06/02/2011    [id number] pill impaction as described above s/p dilation of a probable cervical esophageal web/bx abnormal esophageal and gastric mucosa. + H.pylori gastritis   . Left heart catheterization with coronary angiogram N/A 07/03/2014    Procedure: LEFT HEART CATHETERIZATION WITH CORONARY ANGIOGRAM;  Surgeon: Belva Crome, MD;  Location: [facility] CATH LAB;  Service: Cardiovascular;  Laterality: N/A;    FAMILY HISTORY: Family  History  Problem Relation Age of Onset  . Colon cancer Paternal Grandfather   . Cancer Father   . Cancer Maternal Uncle   . Alzheimer's disease Paternal Aunt   . Cirrhosis Maternal Uncle   . Cancer Paternal Aunt     lung  . Heart disease Brother   . Heart attack Brother   . Mental illness Daughter   . ADD / ADHD Daughter   . Bipolar disorder Daughter   . Mental illness Son   . ADD / ADHD Son   . Bipolar disorder Son   . Mental illness Son   . ADD / ADHD Son   . Bipolar disorder Son     SOCIAL HISTORY:  Social History   Social History  . Marital Status: Single    Spouse Name: N/A  . Number of Children: 3  . Years of Education: Assoc   Occupational History  . Retired     disability   Social History Main Topics  . Smoking status: Former Smoker -- 0.20 packs/day for 32 years    Types: Cigarettes    Start date: 07/05/1983    Quit date: 04/03/2012  . Smokeless tobacco: Never Used  . Alcohol Use: No  . Drug Use: No  . Sexual Activity: Yes    Birth Control/ Protection: Surgical   Other Topics Concern  . Not on file   Social History Narrative   Patient lives at home with her fiance.   Caffeine Use: 3 cups daily     PHYSICAL EXAM  Filed Vitals:   08/08/15 0856    BP: 118/86  Pulse: 72  Resp: 16  Weight: 161 lb 6.4 oz (73.211 kg)    Not recorded     No exam data present   Body mass index is 27.69 kg/(m^2).  GENERAL EXAM: Patient is in no distress; well developed, nourished and groomed; neck is supple  CARDIOVASCULAR: Regular rate and rhythm, no murmurs, no carotid bruits  NEUROLOGIC: MENTAL STATUS: awake, alert, language fluent, comprehension intact, naming intact, fund of knowledge appropriate CRANIAL NERVE: no papilledema on fundoscopic exam, pupils equal and reactive to light, visual fields full to confrontation, extraocular muscles intact, no nystagmus, facial sensation and strength symmetric, hearing intact, palate elevates symmetrically, uvula midline, shoulder shrug symmetric, tongue midline. MOTOR: normal bulk and tone, full strength in the BUE, BLE SENSORY: normal and symmetric to light touch; ABSENT VIB AT KNEES, ANKLES, TOES COORDINATION: finger-nose-finger, fine finger movements normal REFLEXES: deep tendon reflexes present and symmetric GAIT/STATION: narrow based gait; able to walk tandem; romberg is negative    DIAGNOSTIC DATA (LABS, IMAGING, TESTING) - I reviewed patient records, labs, notes, testing and imaging myself where available.  Lab Results  Component Value Date   WBC 6.4 07/03/2014   HGB 13.0 07/03/2014   HCT 38.9 07/03/2014   MCV 85.7 07/03/2014   PLT 245 07/03/2014      Component Value Date/Time   NA 140 07/03/2014 1100   K 3.6 07/03/2014 1100   CL 102 07/03/2014 1100   CO2 32 07/03/2014 1100   GLUCOSE 102* 07/03/2014 1100   BUN 6 07/03/2014 1100   CREATININE 0.76 07/03/2014 1100   CALCIUM 9.8 07/03/2014 1100   PROT 6.4 05/21/2013 1155   ALBUMIN 3.8 05/21/2013 1155   AST 22 05/21/2013 1155   ALT 19 05/21/2013 1155   ALKPHOS 118* 05/21/2013 1155   BILITOT 0.4 05/21/2013 1155   GFRNONAA >90 07/03/2014 1100   GFRAA >90 07/03/2014 1100  Lab Results  Component Value Date   CHOL 186  05/21/2013   HDL 40 05/21/2013   LDLCALC 123* 05/21/2013   TRIG 115 05/21/2013   CHOLHDL 4.7 05/21/2013   Lab Results  Component Value Date   HGBA1C 5.5 04/03/2012   No results found for: VITAMINB12 No results found for: TSH  3/06/11/15 EEG - normal  07/16/15 MRI brain (without) [I reviewed images myself and agree with interpretation. -VRP]  - No acute findings. No change from MRI on 03/11/14.  07/16/15 MRA head [I reviewed images myself and agree with interpretation. -VRP]  - normal   07/16/15 MRA neck (with and without) [I reviewed images myself and agree with interpretation. -VRP]  1. The right vertebral artery is slightly irregular proximally with 30-40% focal stenosis; however there is robust flow signal superiorly up to the vertebrobasilar junction.  2. The left internal carotid artery at the carotid bulb has smooth plaque without focal stenosis.  3. No significant change from MRA neck on 03/11/14.      ASSESSMENT AND PLAN  50 y.o. year old female here with history of migraine, non-epileptic spells, now with new onset of multiple syncope events in last 2 months. These new events do not sound like seizure. Could be cardiogenic syncope or vertebrobasilar TIA. MRI brain and MRA head unremarkable. She does have some atherosclerosis in the neck but I do not this she is symptomatic from these, and I would recommend medical management of risk factors. Now with at least 4 more syncope events since 2015.    Ddx passing out spells: cardiogenic syncope, neurogenic syncope, basilar migraine  Ddx headaches: migraine with aura  Syncope and collapse  Migraine with aura and with status migrainosus, not intractable  Chronic low back pain  Insomnia  Anxiety state  Panic attacks  Anemia, iron deficiency  Pica    PLAN: SYNCOPE ATTACKS - continue smoking abstinence - continue aspirin - continue BP control - no driving until event free x 6 months  MIGRAINE HEADACHES - trial of  topiramate 50mg  at bedtime for migraine prevention  IRON DEFICIENCY ANEMIA / PICA - follow up with PCP, GI and hemo-onc re: severe iron deficiency anemia and pica; tried and failed green vegetables, iron supplements. May need IV iron therapy  Meds ordered this encounter  Medications  . topiramate (TOPAMAX) 50 MG tablet    Sig: Take 1 tablet (50 mg total) by mouth 2 (two) times daily.    Dispense:  60 tablet    Refill:  12   Return in about 3 months (around 11/07/2015).    Penni Bombard, MD [id number], 0000000 AM Certified in Neurology, Neurophysiology and Neuroimaging  [facility] [address], [place], [place] [address] [phone]

## 2015-08-08 NOTE — Patient Instructions (Addendum)
Thank you for coming to see [place] at [facility]. I hope we have been able to provide you high quality care today.  You may receive a patient satisfaction survey over the next few weeks. We would appreciate your feedback and comments so that we may continue to improve ourselves and the health of our patients.  - start topiramate 29m at bedtime for migraine prevention; after 2 weeks increase to twice a day; drink plenty of water with this mediction - follow up with PCP, GI and hematomolgy re: severe iron deficiency anemia   ~~~~~~~~~~~~~~~~~~~~~~~~~~~~~~~~~~~~~~~~~~~~~~~~~~~~~~~~~~~~~~~~~  DR. PENUMALLI'S GUIDE TO HAPPY AND HEALTHY LIVING These are some of my general health and wellness recommendations. Some of them may apply to you better than others. Please use common sense as you try these suggestions and feel free to ask me any questions.   ACTIVITY/FITNESS Mental, social, emotional and physical stimulation are very important for brain and body health. Try learning a new activity (arts, music, language, sports, games).  Keep moving your body to the best of your abilities. You can do this at home, inside or outside, the park, community center, gym or anywhere you like. Consider a physical therapist or personal trainer to get started. Consider the app Sworkit. Fitness trackers such as smart-watches, smart-phones or Fitbits can help as well.   NUTRITION Eat more plants: colorful vegetables, nuts, seeds and berries.  Eat less sugar, salt, preservatives and processed foods.  Avoid toxins such as cigarettes and alcohol.  Drink water when you are thirsty. Warm water with a slice of lemon is an excellent morning drink to start the day.  Consider these websites for more information The Nutrition Source (hhttps://www.henry-hernandez.biz/ Precision Nutrition (wWindowBlog.ch   RELAXATION Consider practicing mindfulness meditation  or other relaxation techniques such as deep breathing, prayer, yoga, tai chi, massage. See website mindful.org or the apps Headspace or Calm to help get started.   SLEEP Try to get at least 7-8+ hours sleep per day. Regular exercise and reduced caffeine will help you sleep better. Practice good sleep hygeine techniques. See website sleep.org for more information.   PLANNING Prepare estate planning, living will, healthcare POA documents. Sometimes this is best planned with the help of an attorney. Theconversationproject.org and agingwithdignity.org are excellent resources.

## 2015-08-28 ENCOUNTER — Other Ambulatory Visit: Payer: Medicare Other | Admitting: Adult Health

## 2015-09-03 ENCOUNTER — Telehealth: Payer: Self-pay | Admitting: *Deleted

## 2015-09-03 NOTE — Telephone Encounter (Signed)
Per Dr Leta Baptist, spoke with patient and informed her that her MRI results are unremarkable, no change from previous MRI 02/2014. She verbalized understanding, appreciation.

## 2015-09-09 ENCOUNTER — Ambulatory Visit (INDEPENDENT_AMBULATORY_CARE_PROVIDER_SITE_OTHER): Payer: Medicare Other | Admitting: Adult Health

## 2015-09-09 ENCOUNTER — Encounter: Payer: Self-pay | Admitting: Adult Health

## 2015-09-09 VITALS — BP 140/80 | HR 86 | Ht 64.5 in | Wt 162.0 lb

## 2015-09-09 DIAGNOSIS — A499 Bacterial infection, unspecified: Secondary | ICD-10-CM | POA: Diagnosis not present

## 2015-09-09 DIAGNOSIS — N760 Acute vaginitis: Secondary | ICD-10-CM

## 2015-09-09 DIAGNOSIS — Z1211 Encounter for screening for malignant neoplasm of colon: Secondary | ICD-10-CM | POA: Diagnosis not present

## 2015-09-09 DIAGNOSIS — N898 Other specified noninflammatory disorders of vagina: Secondary | ICD-10-CM | POA: Insufficient documentation

## 2015-09-09 DIAGNOSIS — B029 Zoster without complications: Secondary | ICD-10-CM | POA: Diagnosis not present

## 2015-09-09 DIAGNOSIS — Z853 Personal history of malignant neoplasm of breast: Secondary | ICD-10-CM | POA: Diagnosis not present

## 2015-09-09 DIAGNOSIS — Z01411 Encounter for gynecological examination (general) (routine) with abnormal findings: Secondary | ICD-10-CM

## 2015-09-09 DIAGNOSIS — B9689 Other specified bacterial agents as the cause of diseases classified elsewhere: Secondary | ICD-10-CM

## 2015-09-09 DIAGNOSIS — Z01419 Encounter for gynecological examination (general) (routine) without abnormal findings: Secondary | ICD-10-CM

## 2015-09-09 HISTORY — DX: Other specified bacterial agents as the cause of diseases classified elsewhere: B96.89

## 2015-09-09 HISTORY — DX: Zoster without complications: B02.9

## 2015-09-09 LAB — HEMOCCULT GUIAC POC 1CARD (OFFICE): Fecal Occult Blood, POC: NEGATIVE

## 2015-09-09 LAB — POCT WET PREP (WET MOUNT)
Clue Cells Wet Prep Whiff POC: POSITIVE
WBC WET PREP: POSITIVE

## 2015-09-09 MED ORDER — METRONIDAZOLE 500 MG PO TABS: 500.0000 mg | ORAL_TABLET | Freq: Two times a day (BID) | ORAL | Status: DC

## 2015-09-09 MED ORDER — VALACYCLOVIR HCL 1 G PO TABS: 1000.0000 mg | ORAL_TABLET | Freq: Three times a day (TID) | ORAL | Status: DC

## 2015-09-09 NOTE — Progress Notes (Signed)
Patient ID: Bethany Wang, female   DOB: [birth year], 50 y.o.   MRN: [id number] History of Present Illness: Bethany Wang is a 50 year old black female, engaged, G4P3, sp hysterectomy and has history of breast cancer at 50, in for a well woman gyn exam.She has hot flashes and thinks she has yeast.She has itching on her back for 3 weeks,was treated with Diflucan by Dr Manuella Ghazi, her PCP.   Current Medications, Allergies, Past Medical History, Past Surgical History, Family History and Social History were reviewed in [employer] record.     Review of Systems: Patient denies any  Daily headaches, hearing loss, fatigue, blurred vision, shortness of breath, chest pain, abdominal pain, problems with bowel movements, urination, or intercourse(not currently active). No joint pain or mood swings.See HPI for positives.    Physical Exam:BP 140/80 mmHg  Pulse 86  Ht 5' 4.5" (1.638 m)  Wt 162 lb (73.483 kg)  BMI 27.39 kg/m2 General:  Well developed, well nourished, no acute distress Skin:  Warm and dry, has red raised area on back to right of her spine that extends into tattoo Neck:  Midline trachea, normal thyroid, good ROM, no lymphadenopathy Lungs; Clear to auscultation bilaterally Breast:  No dominant palpable mass, retraction, or nipple discharge Cardiovascular: Regular rate and rhythm Abdomen:  Soft, non tender, no hepatosplenomegaly Pelvic:  External genitalia is normal in appearance, no lesions.  The vagina is normal in appearance,with white frothy discharge with odor. Urethra has no lesions or masses. The cervix and uterus are absent. No adnexal masses or tenderness noted.Bladder is non tender, no masses felt.Wet prep:+WBCs and + clue cells. Rectal: Good sphincter tone, no polyps, or hemorrhoids felt.  Hemoccult negative. Extremities/musculoskeletal:  No swelling or varicosities noted, no clubbing or cyanosis Psych:  No mood changes, alert and cooperative,seems  happy   Impression: Well woman gyn exam no pap Vaginal discharge BV ?shingles History of breast cancer   Plan: Rx flagyl 500 mg 1 bid x 7 days, no alcohol, review handout on BV   No sex during treatment Rx valtrex 1 gm #30 take 1 tid x 10 days, if not better call or see PCP, no hot showers Physical in 1 year Get mammogram now, is past due,number give for [facility] Labs with PCP Colonoscopy at 26

## 2015-09-09 NOTE — Patient Instructions (Signed)
Get mammogram [phone] Physical in 1 year Take flagyl NO alcohol or SEX while taking meds Take valtrex tid x 10 days Colonoscopy at 5  Labs with PCP      Bacterial Vaginosis Bacterial vaginosis is a vaginal infection that occurs when the normal balance of bacteria in the vagina is disrupted. It results from an overgrowth of certain bacteria. This is the most common vaginal infection in women of childbearing age. Treatment is important to prevent complications, especially in pregnant women, as it can cause a premature delivery. CAUSES  Bacterial vaginosis is caused by an increase in harmful bacteria that are normally present in smaller amounts in the vagina. Several different kinds of bacteria can cause bacterial vaginosis. However, the reason that the condition develops is not fully understood. RISK FACTORS Certain activities or behaviors can put you at an increased risk of developing bacterial vaginosis, including:  Having a new sex partner or multiple sex partners.  Douching.  Using an intrauterine device (IUD) for contraception. Women do not get bacterial vaginosis from toilet seats, bedding, swimming pools, or contact with objects around them. SIGNS AND SYMPTOMS  Some women with bacterial vaginosis have no signs or symptoms. Common symptoms include:  Grey vaginal discharge.  A fishlike odor with discharge, especially after sexual intercourse.  Itching or burning of the vagina and vulva.  Burning or pain with urination. DIAGNOSIS  Your health care provider will take a medical history and examine the vagina for signs of bacterial vaginosis. A sample of vaginal fluid may be taken. Your health care provider will look at this sample under a microscope to check for bacteria and abnormal cells. A vaginal pH test may also be done.  TREATMENT  Bacterial vaginosis may be treated with antibiotic medicines. These may be given in the form of a pill or a vaginal cream. A second round of  antibiotics may be prescribed if the condition comes back after treatment. Because bacterial vaginosis increases your risk for sexually transmitted diseases, getting treated can help reduce your risk for chlamydia, gonorrhea, HIV, and herpes. HOME CARE INSTRUCTIONS   Only take over-the-counter or prescription medicines as directed by your health care provider.  If antibiotic medicine was prescribed, take it as directed. Make sure you finish it even if you start to feel better.  Tell all sexual partners that you have a vaginal infection. They should see their health care provider and be treated if they have problems, such as a mild rash or itching.  During treatment, it is important that you follow these instructions:  Avoid sexual activity or use condoms correctly.  Do not douche.  Avoid alcohol as directed by your health care provider.  Avoid breastfeeding as directed by your health care provider. SEEK MEDICAL CARE IF:   Your symptoms are not improving after 3 days of treatment.  You have increased discharge or pain.  You have a fever. MAKE SURE YOU:   Understand these instructions.  Will watch your condition.  Will get help right away if you are not doing well or get worse. FOR MORE INFORMATION  Centers for Disease Control and Prevention, Division of STD Prevention: AppraiserFraud.fi American Sexual Health Association (ASHA): www.ashastd.org    This information is not intended to replace advice given to you by your health care provider. Make sure you discuss any questions you have with your health care provider.   Document Released: 03/29/2005 Document Revised: 04/19/2014 Document Reviewed: 11/08/2012 Elsevier Interactive Patient Education [employer].

## 2015-09-17 ENCOUNTER — Encounter: Payer: Self-pay | Admitting: Cardiology

## 2015-09-17 ENCOUNTER — Ambulatory Visit (INDEPENDENT_AMBULATORY_CARE_PROVIDER_SITE_OTHER): Payer: Medicare Other | Admitting: Cardiology

## 2015-09-17 VITALS — BP 162/108 | HR 81 | Ht 64.0 in | Wt 164.0 lb

## 2015-09-17 DIAGNOSIS — E782 Mixed hyperlipidemia: Secondary | ICD-10-CM | POA: Diagnosis not present

## 2015-09-17 DIAGNOSIS — I25119 Atherosclerotic heart disease of native coronary artery with unspecified angina pectoris: Secondary | ICD-10-CM | POA: Diagnosis not present

## 2015-09-17 DIAGNOSIS — I10 Essential (primary) hypertension: Secondary | ICD-10-CM

## 2015-09-17 MED ORDER — AMLODIPINE BESYLATE 10 MG PO TABS: 10.0000 mg | ORAL_TABLET | Freq: Every day | ORAL | Status: DC

## 2015-09-17 NOTE — Patient Instructions (Signed)
Your physician has recommended you make the following change in your medication:  Increase amlodipine to 10 mg daily. You may take 2 of your 5 mg tablets daily until they are finished. Continue all other medications the same. Your physician recommends that you schedule a follow-up appointment in: 6 months. You will receive a reminder letter in the mail in about 4 months reminding you to call and schedule your appointment. If you don't receive this letter, please contact our office.

## 2015-09-17 NOTE — Progress Notes (Signed)
Cardiology Office Note  Date: 09/17/2015   ID: Bethany Wang, DOB [birth year], MRN [id number]  PCP: Bethany Blitz, MD  Primary Cardiologist: Bethany Lesches, MD   Chief Complaint  Patient presents with  . Coronary Artery Disease    History of Present Illness: Bethany Wang is a 50 y.o. female last seen in March. She presents for a routine follow-up visit with her husband. States that she has been under a lot of stress with family issues. Did not endorse any progressing chest pain however. Blood pressure was significantly elevated today, she states that this has been the case over the last few months. She reports compliance with her medications.  Imdur was increased to 60 mg daily at the last visit.  Follow-up with Neurology noted, I reviewed the recent notes. She states that she has been diagnosed with significant iron deficiency anemia and may be undergoing iron transfusions as managed by her PCP. Recent fecal occult blood negative. She does not endorse any obvious bleeding problems. Has a history of Pica.  Past Medical History  Diagnosis Date  . 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 Seizures ([place])   . Essential hypertension, benign   . Mixed hyperlipidemia   . GERD (gastroesophageal reflux disease)   . Suicide attempt ([place])     3 attempts in remote past  . PUD (peptic ulcer disease)   . Coronary atherosclerosis of native coronary artery     DES to RCA 03/2012, DES to LAD at [facility] 05/2012  . Type 2 diabetes mellitus ([place])   . History of hematuria   . Bradycardia   . Breast cancer, right breast ([place])   . Uterine cancer ([place])   . Falls     x 3-4 in past year  . Anemia   . Vaginal discharge 09/09/2015  . BV (bacterial vaginosis) 09/09/2015  . Shingles 09/09/2015  . History of breast cancer 09/09/2015    Past Surgical History  Procedure Laterality Date  . Abdominal hysterectomy    . Bladder surgery    . Shoulder surgery Left   . Hand surgery  Right   . Eye surgery      Removed glass  . Left breast lumpectomy      Benign  . Colonoscopy  May 2010    Fleishman: normal rectum, internal hemorrhoids, , benign colonic polyp  . Esophagogastroduodenoscopy      2001 Dr. Amedeo Plenty: distal esophagitis, small hiatal hernia,. Dr. Tamala Julian 2006? no records available currently, pt also reports  EGD a few years ago with Dr. Gala Romney, do not have these reports anywhere in medical records  . Esophagogastroduodenoscopy  06/02/2011    [id number] pill impaction as described above s/p dilation of a probable cervical esophageal web/bx abnormal esophageal and gastric mucosa. + H.pylori gastritis   . Left heart catheterization with coronary angiogram N/A 07/03/2014    Procedure: LEFT HEART CATHETERIZATION WITH CORONARY ANGIOGRAM;  Surgeon: Belva Crome, MD;  Location: [facility] CATH LAB;  Service: Cardiovascular;  Laterality: N/A;    Current Outpatient Prescriptions  Medication Sig Dispense Refill  . albuterol (PROVENTIL HFA;VENTOLIN HFA) 108 (90 BASE) MCG/ACT inhaler Inhale 2 puffs into the lungs every 6 (six) hours as needed for wheezing or shortness of breath.    [name] amLODipine (NORVASC) 5 MG tablet Take 5 mg by mouth daily.     [name] aspirin 81 MG chewable tablet Chew 1 tablet (81 mg total) by mouth daily.    [name]  atorvastatin (LIPITOR) 80 MG tablet Take 1 tablet (80 mg total) by mouth daily. 30 tablet 6  . BACLOFEN PO Take 10 mg by mouth as needed.    . diazepam (VALIUM) 10 MG tablet Take 10 mg by mouth every 6 (six) hours as needed for anxiety.    . diphenhydrAMINE (BENADRYL) 25 mg capsule Take 25 mg by mouth daily as needed for allergies.    . DULoxetine (CYMBALTA) 60 MG capsule Take 60 mg by mouth daily.    [name] EPINEPHrine 0.3 mg/0.3 mL IJ SOAJ injection Inject 0.3 mg into the muscle once.    . etodolac (LODINE) 400 MG tablet Take 400 mg by mouth 2 (two) times daily.    [name] gabapentin (NEURONTIN) 300 MG capsule Take 300 mg by mouth 3 (three) times daily.    [name] HYDROXYZINE  PAMOATE PO Take 25 mg by mouth 3 (three) times daily.    . isosorbide mononitrate (IMDUR) 60 MG 24 hr tablet Take 1 tablet (60 mg total) by mouth 2 (two) times daily. 60 tablet 6  . Linaclotide (LINZESS) 145 MCG CAPS capsule Take 1 capsule (145 mcg total) by mouth daily. 30 capsule 5  . lisinopril-hydrochlorothiazide (PRINZIDE,ZESTORETIC) 20-25 MG per tablet Take 1 tablet by mouth daily.     . meloxicam (MOBIC) 15 MG tablet Take 15 mg by mouth daily.    . Naproxen Sodium 220 MG CAPS Take 1 capsule by mouth daily as needed (pain).    . nitroGLYCERIN (NITROSTAT) 0.4 MG SL tablet Place 1 tablet (0.4 mg total) under the tongue every 5 (five) minutes x 3 doses as needed for chest pain. If no relief after 3rd dose, proceed to the ED for an evaluation 25 tablet 3  . pantoprazole (PROTONIX) 40 MG tablet Take 40 mg by mouth daily.    [name] topiramate (TOPAMAX) 50 MG tablet Take 1 tablet (50 mg total) by mouth 2 (two) times daily. 60 tablet 12  . triamcinolone lotion (KENALOG) 0.1 % Apply 1 application topically as needed.     . valACYclovir (VALTREX) 1000 MG tablet Take 1 tablet (1,000 mg total) by mouth 3 (three) times daily. 30 tablet 0  . zolpidem (AMBIEN) 10 MG tablet Take 10 mg by mouth at bedtime.     No current facility-administered medications for this visit.   Allergies:  Penicillins   Social History: The patient  reports that she quit smoking about 3 years ago. Her smoking use included Cigarettes. She started smoking about 32 years ago. She has a 6.4 pack-year smoking history. She has never used smokeless tobacco. She reports that she does not drink alcohol or use illicit drugs.   ROS:  Please see the history of present illness. Otherwise, complete review of systems is positive for psychosocial stressors and anxiety.  All other systems are reviewed and negative.   Physical Exam: VS:  BP 162/108 mmHg  Pulse 81  Ht 5\' 4"  (1.626 m)  Wt 164 lb (74.39 kg)  BMI 28.14 kg/m2  SpO2 98%, BMI Body mass  index is 28.14 kg/(m^2).  Wt Readings from Last 3 Encounters:  09/17/15 164 lb (74.39 kg)  09/09/15 162 lb (73.483 kg)  08/08/15 161 lb 6.4 oz (73.211 kg)    Appears comfortable. No active chest pain. HEENT: Conjunctiva and lids normal, oropharynx clear.  Neck: Supple, no elevated JVP or carotid bruits, no thyromegaly.  Lungs: Clear to auscultation, nonlabored breathing at rest.  Cardiac: Regular rate and rhythm, no S3 or significant systolic murmur,  no pericardial rub.  Abdomen: Soft, nontender, bowel sounds present.  Extremities: No pitting edema, distal pulses 2+. Skin: Warm and dry. Musculoskeletal: No kyphosis. Neuropsychiatric: Alert and oriented 3, calm.  ECG: I personally reviewed the prior tracing from 03/05/2015 which showed sinus rhythm with increased voltage and nonspecific T-wave changes  Recent Labwork: No results found for requested labs within last 365 days.     Component Value Date/Time   CHOL 186 05/21/2013 1155   TRIG 115 05/21/2013 1155   HDL 40 05/21/2013 1155   CHOLHDL 4.7 05/21/2013 1155   VLDL 23 05/21/2013 1155   LDLCALC 123* 05/21/2013 1155    Other Studies Reviewed Today:  She had a cardiac catheterization done in March 2016 due to progressive chest pain symptoms demonstrating chronic total occlusion of the proximal RCA at prior stent site with good collateralization from both the circumflex and the LAD, otherwise widely patent LAD stent site and 50% distal circumflex stenosis. Medical therapy was recommended by Dr. Tamala Julian and a long-acting nitrate was added.  Chest CTA from [facility] on 03/06/2015 reported no pulmonary embolus, coronary calcifications within the LAD and RCA distributions, no pleural effusion, stable left thyroid nodule.  Assessment and Plan:  1. CAD status post previous DES to the RCA in 2013 as well as DES to the LAD in 2014. RCA is occluded with good left-to-right collaterals and we continue medical therapy. Norvasc is being  increased today for better control of blood pressure, this may also provide additional angina control.  2. Essential hypertension, blood pressure is not well controlled this time. Could be complicated by psychosocial stressors. We will increase Norvasc to 10 mg daily.  3. Reported history of iron deficiency anemia. Iron infusions are being considered by PCP. No specific cardiac contraindication.  4. Hyperlipidemia, continues on Lipitor.  Current medicines were reviewed with the patient today.  Disposition: FU with me in 6 months.   Signed, Satira Sark, MD, [facility] 09/17/2015 10:12 AM    [facility] [place], [place], [place] [address] Phone: [phone]; Fax: [phone]

## 2015-11-10 ENCOUNTER — Ambulatory Visit (INDEPENDENT_AMBULATORY_CARE_PROVIDER_SITE_OTHER): Payer: Medicare Other | Admitting: Diagnostic Neuroimaging

## 2015-11-10 ENCOUNTER — Encounter: Payer: Self-pay | Admitting: Diagnostic Neuroimaging

## 2015-11-10 VITALS — BP 136/97 | HR 66 | Wt 161.4 lb

## 2015-11-10 DIAGNOSIS — R5383 Other fatigue: Secondary | ICD-10-CM

## 2015-11-10 DIAGNOSIS — R4781 Slurred speech: Secondary | ICD-10-CM | POA: Diagnosis not present

## 2015-11-10 DIAGNOSIS — R55 Syncope and collapse: Secondary | ICD-10-CM

## 2015-11-10 DIAGNOSIS — R413 Other amnesia: Secondary | ICD-10-CM

## 2015-11-10 NOTE — Patient Instructions (Signed)
-   I will check MRI brain and EEG  - follow up with PCP, cardiology and GI clinics  - no driving until no passing out spells for 6 months

## 2015-11-10 NOTE — Progress Notes (Addendum)
[facility]  PATIENT: Bethany Wang DOB: 09/14/1965  REFERRING CLINICIAN: Manuella Ghazi HISTORY FROM: patient REASON FOR VISIT: follow up   HISTORICAL  CHIEF COMPLAINT:  Chief Complaint  Patient presents with  . Other    rm 7, syncope and collapse, friend- Saralyn Pilar,  "I want to know is my speech supposed to be slurred; I'm having trouble remembering things; pain in right jaw x 4 days last week"  . Follow-up    3 month    HISTORY OF PRESENT ILLNESS:   UPDATE 11/10/15: Since last visit, had another syncope event at church 2 weeks ago, while sitting down, no warning. Collapsed, hit head, LOC, no seizure activity noted. Taken to [facility] by 911, spent 1 night in ICU and then d/c home. Still eating 1 time per day (minimal food). Has poor appetite due to severe nausea with eating. Not seen GI in a long time. Still eating red dirt and ice.   UPDATE 08/08/15: Since last visit, no syncope events. Continues with headaches (3-4 per week). Also reports significant pica symptoms (ice and red dirt) and has history of iron deficiency anemia. Has tried iron tabs but could not tolerate.   UPDATE 06/25/15: Since last visit, continues to have episodes of syncope (4 since 2015). First feels sweaty, then LOC. Then wakes up with nausea, headache, pounding, left eye pain, ringing in ears, photophobia, then left arm numbness.  UPDATE 02/13/14: Since last visit, patient here for multiple syncope events. Has had 3 events in the last 2 months. Symptoms start with neck pain, then feeling sweaty, hot sensation, sounds feel like they are in an echo chamber, and then patient collapsed to the ground. No convulsions. Patient is unconscious for 5-10 minutes at a time. She then is able to wake up, with energy drained feeling, sweaty hot feeling. No convulsions. No tongue biting or incontinence. Patient has had some double vision with some of these events.  PRIOR HPI (01/21/12): 50 year old right-handed  female with history of hypertension, diabetes, here for evaluation of migraine. Patient reports history of intermittent severe headaches since childhood. Headaches typically start with nausea and vomiting, sensitivity sound, followed by severe frontal headaches. She's been on Friday medications over the years. Nowadays she has to severe migraine days per month. She's tried Topamax but this caused a rash. She's been on amitriptyline since that time to help with migraine control. She also has significant stress in her life with anger issues, and is on Cymbalta, venlafaxine and Depakote. With some of these severe headaches, patient has had episodes where she passes out, falls to the ground and shakes. To the emergency room for evaluation the past. She's also seen by another neurologist, who did EEG, and diagnosed her with nonepileptic spells.   REVIEW OF SYSTEMS: Full 14 system review of systems performed and negative except for: fatigue pain headache ringing in ears fatigue insomnia drooling slurrred speech chest tightness chest pain freq urination eye pain double vision excessive thirst.    ALLERGIES: Allergies  Allergen Reactions  . Penicillins Anaphylaxis    Patient states she carries an epi-pen    HOME MEDICATIONS: Outpatient Medications Prior to Visit  Medication Sig Dispense Refill  . albuterol (PROVENTIL HFA;VENTOLIN HFA) 108 (90 BASE) MCG/ACT inhaler Inhale 2 puffs into the lungs every 6 (six) hours as needed for wheezing or shortness of breath.    [name] amLODipine (NORVASC) 10 MG tablet Take 1 tablet (10 mg total) by mouth daily. 90 tablet 3  . aspirin  81 MG chewable tablet Chew 1 tablet (81 mg total) by mouth daily.    [name] atorvastatin (LIPITOR) 80 MG tablet Take 1 tablet (80 mg total) by mouth daily. 30 tablet 6  . BACLOFEN PO Take 10 mg by mouth as needed.    . diazepam (VALIUM) 10 MG tablet Take 10 mg by mouth every 6 (six) hours as needed for anxiety.    . diphenhydrAMINE (BENADRYL) 25 mg  capsule Take 25 mg by mouth daily as needed for allergies.    . DULoxetine (CYMBALTA) 60 MG capsule Take 60 mg by mouth daily.    [name] EPINEPHrine 0.3 mg/0.3 mL IJ SOAJ injection Inject 0.3 mg into the muscle once.    . etodolac (LODINE) 400 MG tablet Take 400 mg by mouth 2 (two) times daily.    [name] gabapentin (NEURONTIN) 300 MG capsule Take 300 mg by mouth 3 (three) times daily.    [name] HYDROXYZINE PAMOATE PO Take 25 mg by mouth 3 (three) times daily.    . isosorbide mononitrate (IMDUR) 60 MG 24 hr tablet Take 1 tablet (60 mg total) by mouth 2 (two) times daily. 60 tablet 6  . Linaclotide (LINZESS) 145 MCG CAPS capsule Take 1 capsule (145 mcg total) by mouth daily. 30 capsule 5  . lisinopril-hydrochlorothiazide (PRINZIDE,ZESTORETIC) 20-25 MG per tablet Take 1 tablet by mouth daily.     . meloxicam (MOBIC) 15 MG tablet Take 15 mg by mouth daily.    . Naproxen Sodium 220 MG CAPS Take 1 capsule by mouth daily as needed (pain).    . nitroGLYCERIN (NITROSTAT) 0.4 MG SL tablet Place 1 tablet (0.4 mg total) under the tongue every 5 (five) minutes x 3 doses as needed for chest pain. If no relief after 3rd dose, proceed to the ED for an evaluation 25 tablet 3  . pantoprazole (PROTONIX) 40 MG tablet Take 40 mg by mouth daily.    [name] topiramate (TOPAMAX) 50 MG tablet Take 1 tablet (50 mg total) by mouth 2 (two) times daily. 60 tablet 12  . triamcinolone lotion (KENALOG) 0.1 % Apply 1 application topically as needed.     . valACYclovir (VALTREX) 1000 MG tablet Take 1 tablet (1,000 mg total) by mouth 3 (three) times daily. 30 tablet 0  . zolpidem (AMBIEN) 10 MG tablet Take 10 mg by mouth at bedtime.     No facility-administered medications prior to visit.     PAST MEDICAL HISTORY: Past Medical History:  Diagnosis Date  . Anemia   . Bradycardia   . Breast cancer, right breast ([place])   . BV (bacterial vaginosis) 09/09/2015  . Coronary atherosclerosis of native coronary artery    DES to RCA 03/2012, DES to LAD at  [facility] 05/2012  . Cyst of pharynx or nasopharynx    [name]  . Essential hypertension, benign   . Falls    x 3-4 in past year  . GERD (gastroesophageal reflux disease)   . History of breast cancer 09/09/2015  . History of hematuria   . Mixed hyperlipidemia   . PUD (peptic ulcer disease)   . Seizures ([place])   . Shingles 09/09/2015  . Suicide attempt ([place])    3 attempts in remote past  . Type 2 diabetes mellitus ([place])   . Uterine cancer ([place])   . Vaginal discharge 09/09/2015    PAST SURGICAL HISTORY: Past Surgical History:  Procedure Laterality Date  . ABDOMINAL HYSTERECTOMY    . BLADDER SURGERY    .  COLONOSCOPY  May 2010   Fleishman: normal rectum, internal hemorrhoids, , benign colonic polyp  . ESOPHAGOGASTRODUODENOSCOPY     2001 Dr. Amedeo Plenty: distal esophagitis, small hiatal hernia,. Dr. Tamala Julian 2006? no records available currently, pt also reports  EGD a few years ago with Dr. Gala Romney, do not have these reports anywhere in medical records  . ESOPHAGOGASTRODUODENOSCOPY  06/02/2011   [id number] pill impaction as described above s/p dilation of a probable cervical esophageal web/bx abnormal esophageal and gastric mucosa. + H.pylori gastritis   . EYE SURGERY     Removed glass  . HAND SURGERY Right   . Left breast lumpectomy     Benign  . LEFT HEART CATHETERIZATION WITH CORONARY ANGIOGRAM N/A 07/03/2014   Procedure: LEFT HEART CATHETERIZATION WITH CORONARY ANGIOGRAM;  Surgeon: Belva Crome, MD;  Location: [facility] CATH LAB;  Service: Cardiovascular;  Laterality: N/A;  . SHOULDER SURGERY Left     FAMILY HISTORY: Family History  Problem Relation Age of Onset  . Colon cancer Paternal Grandfather   . Cancer Father   . Cancer Maternal Uncle   . Alzheimer's disease Paternal Aunt   . Cirrhosis Maternal Uncle   . Cancer Paternal Aunt     lung  . Heart disease Brother   . Heart attack Brother   . Mental illness Daughter   . ADD / ADHD Daughter   . Bipolar disorder  Daughter   . Mental illness Son   . ADD / ADHD Son   . Bipolar disorder Son   . Mental illness Son   . ADD / ADHD Son   . Bipolar disorder Son     SOCIAL HISTORY:  Social History   Social History  . Marital status: Single    Spouse name: N/A  . Number of children: 3  . Years of education: Assoc   Occupational History  . Retired     disability   Social History Main Topics  . Smoking status: Former Smoker    Packs/day: 0.20    Years: 32.00    Types: Cigarettes    Start date: 07/05/1983    Quit date: 04/03/2012  . Smokeless tobacco: Never Used  . Alcohol use No  . Drug use: No  . Sexual activity: Not Currently    Birth control/ protection: Surgical     Comment: hyst   Other Topics Concern  . Not on file   Social History Narrative   Patient lives at home with her fiance.   Caffeine Use: 3 cups daily     PHYSICAL EXAM  Vitals:   11/10/15 0815  BP: (!) 136/97  Pulse: 66  Weight: 161 lb 6.4 oz (73.2 kg)    Not recorded     No exam data present  Body mass index is 27.7 kg/m.   GENERAL EXAM: Patient is in no distress; well developed, nourished and groomed; neck is supple  CARDIOVASCULAR: Regular rate and rhythm, no murmurs, no carotid bruits  NEUROLOGIC: MENTAL STATUS: awake, alert, language fluent, comprehension intact, naming intact, fund of knowledge appropriate CRANIAL NERVE: no papilledema on fundoscopic exam, pupils equal and reactive to light, visual fields full to confrontation, extraocular muscles intact, no nystagmus, facial sensation and strength symmetric, hearing intact, palate elevates symmetrically, uvula midline, shoulder shrug symmetric, tongue midline. MOTOR: normal bulk and tone, full strength in the BUE, BLE SENSORY: normal and symmetric to light touch and vibration and temperature COORDINATION: finger-nose-finger, fine finger movements normal REFLEXES: deep tendon reflexes present and symmetric GAIT/STATION: LIMPING,  ANTALGIC GAIT  DUE TO LEFT HIP PAIN     DIAGNOSTIC DATA (LABS, IMAGING, TESTING) - I reviewed patient records, labs, notes, testing and imaging myself where available.  Lab Results  Component Value Date   WBC 6.4 07/03/2014   HGB 13.0 07/03/2014   HCT 38.9 07/03/2014   MCV 85.7 07/03/2014   PLT 245 07/03/2014      Component Value Date/Time   NA 140 07/03/2014 1100   K 3.6 07/03/2014 1100   CL 102 07/03/2014 1100   CO2 32 07/03/2014 1100   GLUCOSE 102 (H) 07/03/2014 1100   BUN 6 07/03/2014 1100   CREATININE 0.76 07/03/2014 1100   CALCIUM 9.8 07/03/2014 1100   PROT 6.4 05/21/2013 1155   ALBUMIN 3.8 05/21/2013 1155   AST 22 05/21/2013 1155   ALT 19 05/21/2013 1155   ALKPHOS 118 (H) 05/21/2013 1155   BILITOT 0.4 05/21/2013 1155   GFRNONAA >90 07/03/2014 1100   GFRAA >90 07/03/2014 1100   Lab Results  Component Value Date   CHOL 186 05/21/2013   HDL 40 05/21/2013   LDLCALC 123 (H) 05/21/2013   TRIG 115 05/21/2013   CHOLHDL 4.7 05/21/2013   Lab Results  Component Value Date   HGBA1C 5.5 04/03/2012   No results found for: VITAMINB12 No results found for: TSH  3/06/11/15 EEG - normal  07/16/15 MRI brain (without) [I reviewed images myself and agree with interpretation. -VRP]  - No acute findings. No change from MRI on 03/11/14.  07/16/15 MRA head [I reviewed images myself and agree with interpretation. -VRP]  - normal   07/16/15 MRA neck (with and without) [I reviewed images myself and agree with interpretation. -VRP]  1. The right vertebral artery is slightly irregular proximally with 30-40% focal stenosis; however there is robust flow signal superiorly up to the vertebrobasilar junction.  2. The left internal carotid artery at the carotid bulb has smooth plaque without focal stenosis.  3. No significant change from MRA neck on 03/11/14.      ASSESSMENT AND PLAN  50 y.o. year old female here with history of migraine, non-epileptic spells, now with new onset of multiple syncope  events in last 2 months. These new events do not sound like seizure. Could be cardiogenic syncope or vertebrobasilar TIA. MRI brain and MRA head unremarkable. She does have some atherosclerosis in the neck but I do not this she is symptomatic from these, and I would recommend medical management of risk factors. Now with at least 4 more syncope events since 2015.    Dx:  Syncope and collapse - Plan: MR Brain Wo Contrast, EEG adult, CBC with Differential/Platelet, Comprehensive metabolic panel, TSH, Vitamin B12, Hemoglobin A1c, ANA w/Reflex  Slurred speech - Plan: MR Brain Wo Contrast, EEG adult, CBC with Differential/Platelet, Comprehensive metabolic panel, TSH, Vitamin B12, Hemoglobin A1c, ANA w/Reflex  Other fatigue - Plan: CBC with Differential/Platelet, Comprehensive metabolic panel, TSH, Vitamin B12, Hemoglobin A1c, ANA w/Reflex  Memory loss - Plan: CBC with Differential/Platelet, Comprehensive metabolic panel, TSH, Vitamin B12, Hemoglobin A1c, ANA w/Reflex    PLAN:  RECURRENT SYNCOPE ATTACKS (? Poor nutrition/PO intake, metabolic, cardiogenic, neurogenic, sleep deprivation, seizure) - repeat MRI and EEG - check labs - continue smoking abstinence - continue aspirin - continue BP control - no driving until event free x 6 months  MIGRAINE HEADACHES - trial of topiramate 50mg  at bedtime for migraine prevention  IRON DEFICIENCY ANEMIA / PICA - check iron studies - follow up with PCP, GI and  hemo-onc re: severe iron deficiency anemia and pica; tried and failed green vegetables, iron supplements. May need IV iron therapy  LEFT HIP PAIN - follow up with orthopedic clinic  Orders Placed This Encounter  Procedures  . MR Brain Wo Contrast  . CBC with Differential/Platelet  . Comprehensive metabolic panel  . TSH  . Vitamin B12  . Hemoglobin A1c  . ANA w/Reflex  . Iron and TIBC  . Ferritin  . EEG adult   Return in about 6 weeks (around 12/22/2015).   I reviewed images, labs,  notes, records myself. I summarized findings and reviewed with patient, for this high risk condition (recurrent syncope) requiring high complexity decision making.    Penni Bombard, MD [id number], [id number] AM Certified in Neurology, Neurophysiology and Neuroimaging  [facility] [address] [place],  [address] [phone]

## 2015-11-11 ENCOUNTER — Telehealth: Payer: Self-pay | Admitting: *Deleted

## 2015-11-11 LAB — IRON AND TIBC
Iron Saturation: 23 % (ref 15–55)
Iron: 79 ug/dL (ref 27–159)
Total Iron Binding Capacity: 338 ug/dL (ref 250–450)
UIBC: 259 ug/dL (ref 131–425)

## 2015-11-11 LAB — CBC WITH DIFFERENTIAL/PLATELET
BASOS ABS: 0 10*3/uL (ref 0.0–0.2)
BASOS: 1 %
EOS (ABSOLUTE): 0.1 10*3/uL (ref 0.0–0.4)
Eos: 2 %
Hematocrit: 42.8 % (ref 34.0–46.6)
Hemoglobin: 13.5 g/dL (ref 11.1–15.9)
IMMATURE GRANULOCYTES: 0 %
Immature Grans (Abs): 0 10*3/uL (ref 0.0–0.1)
LYMPHS: 50 %
Lymphocytes Absolute: 3.2 10*3/uL — ABNORMAL HIGH (ref 0.7–3.1)
MCH: 27.4 pg (ref 26.6–33.0)
MCHC: 31.5 g/dL (ref 31.5–35.7)
MCV: 87 fL (ref 79–97)
MONOS ABS: 0.4 10*3/uL (ref 0.1–0.9)
Monocytes: 7 %
NEUTROS PCT: 40 %
Neutrophils Absolute: 2.5 10*3/uL (ref 1.4–7.0)
PLATELETS: 251 10*3/uL (ref 150–379)
RBC: 4.93 x10E6/uL (ref 3.77–5.28)
RDW: 15.3 % (ref 12.3–15.4)
WBC: 6.3 10*3/uL (ref 3.4–10.8)

## 2015-11-11 LAB — FERRITIN: FERRITIN: 36 ng/mL (ref 15–150)

## 2015-11-11 LAB — VITAMIN B12: VITAMIN B 12: 680 pg/mL (ref 211–946)

## 2015-11-11 LAB — COMPREHENSIVE METABOLIC PANEL
A/G RATIO: 1.7 (ref 1.2–2.2)
ALT: 18 IU/L (ref 0–32)
AST: 21 IU/L (ref 0–40)
Albumin: 4.4 g/dL (ref 3.5–5.5)
Alkaline Phosphatase: 126 IU/L — ABNORMAL HIGH (ref 39–117)
BILIRUBIN TOTAL: 0.2 mg/dL (ref 0.0–1.2)
BUN/Creatinine Ratio: 11 (ref 9–23)
BUN: 9 mg/dL (ref 6–24)
CALCIUM: 9.8 mg/dL (ref 8.7–10.2)
CHLORIDE: 103 mmol/L (ref 96–106)
CO2: 24 mmol/L (ref 18–29)
Creatinine, Ser: 0.81 mg/dL (ref 0.57–1.00)
GFR calc Af Amer: 99 mL/min/{1.73_m2} (ref >59)
GFR calc non Af Amer: 86 mL/min/{1.73_m2} (ref >59)
GLUCOSE: 103 mg/dL — AB (ref 65–99)
Globulin, Total: 2.6 g/dL (ref 1.5–4.5)
POTASSIUM: 4.7 mmol/L (ref 3.5–5.2)
Sodium: 143 mmol/L (ref 134–144)
Total Protein: 7 g/dL (ref 6.0–8.5)

## 2015-11-11 LAB — HEMOGLOBIN A1C
Est. average glucose Bld gHb Est-mCnc: 108 mg/dL
HEMOGLOBIN A1C: 5.4 % (ref 4.8–5.6)

## 2015-11-11 LAB — ANA W/REFLEX: Anti Nuclear Antibody(ANA): NEGATIVE

## 2015-11-11 LAB — TSH: TSH: 3.53 u[IU]/mL (ref 0.450–4.500)

## 2015-11-11 NOTE — Telephone Encounter (Signed)
Per Dr Leta Baptist, spoke with patient and informed her that her lab results are unremarkable; he will continue with current treatment plan. Reminded her of EEG tomorrow and FU in Sept. She verbalized understanding, appreciation.

## 2015-11-12 ENCOUNTER — Telehealth: Payer: Self-pay | Admitting: *Deleted

## 2015-11-12 ENCOUNTER — Ambulatory Visit (INDEPENDENT_AMBULATORY_CARE_PROVIDER_SITE_OTHER): Payer: Medicare Other | Admitting: Neurology

## 2015-11-12 DIAGNOSIS — R55 Syncope and collapse: Secondary | ICD-10-CM

## 2015-11-12 DIAGNOSIS — R4781 Slurred speech: Secondary | ICD-10-CM

## 2015-11-12 NOTE — Procedures (Signed)
    History:  Bethany Wang is a 50 year old patient with a history of syncope that occurred in the middle of July 2017. The event occurred while at church without warning. The patient hit her head, no jerking was noted. The patient is being evaluated for these events. The patient has had multiple syncopal episodes over several years.  This is a routine EEG. Skull defects are noted. Medications include albuterol, Norvasc, aspirin, Lipitor, baclofen, diazepam, Benadryl, Cymbalta, Lodine, gabapentin, Imdur, Linzess, Prinzide, Mobic, nitroglycerin, Protonix, Topamax, Valtrex, and Ambien.   EEG classification: Normal awake  Description of the recording: The background rhythms of this recording consists of a fairly well modulated medium amplitude alpha rhythm of 9 Hz that is reactive to eye opening and closure. As the record progresses, the patient appears to remain in the waking state throughout the recording. Photic stimulation was performed, resulting in a bilateral and symmetric photic driving response. Hyperventilation was not performed. At no time during the recording does there appear to be evidence of spike or spike wave discharges or evidence of focal slowing. EKG monitor shows no evidence of cardiac rhythm abnormalities with a heart rate of 66.  Impression: This is a normal EEG recording in the waking state. No evidence of ictal or interictal discharges are seen.

## 2015-11-12 NOTE — Telephone Encounter (Signed)
Per Dr Tish Frederickson, spoke with patient and informed her that her EEG results are normal, continue with current treatment plan. She inquired if she should have MRI done; advised her that she does need to have MRI; it is part of Dr [employer] plan.  She verbalized understanding, appreciation. [name]

## 2015-11-24 ENCOUNTER — Ambulatory Visit
Admission: RE | Admit: 2015-11-24 | Discharge: 2015-11-24 | Disposition: A | Discharge status: Home / Self Care | Payer: Medicare Other | Source: Ambulatory Visit | Attending: Diagnostic Neuroimaging | Admitting: Diagnostic Neuroimaging

## 2015-11-24 DIAGNOSIS — R55 Syncope and collapse: Secondary | ICD-10-CM

## 2015-11-24 DIAGNOSIS — R4781 Slurred speech: Secondary | ICD-10-CM

## 2015-11-26 ENCOUNTER — Telehealth: Payer: Self-pay | Admitting: *Deleted

## 2015-11-26 NOTE — Telephone Encounter (Signed)
Per Dr Leta Baptist, spoke with patient and informed her that her MRI results are unremarkable, and she should continue with current treatment plan. Reviewed Dr Gladstone Lighter instructions in her last office visit and reminded her of her FU in Sept. She verbalized understanding, had no questions.

## 2015-12-17 ENCOUNTER — Other Ambulatory Visit: Payer: Self-pay | Admitting: Internal Medicine

## 2015-12-17 DIAGNOSIS — N6311 Unspecified lump in the right breast, upper outer quadrant: Secondary | ICD-10-CM

## 2015-12-22 ENCOUNTER — Other Ambulatory Visit: Payer: Medicare Other

## 2015-12-22 ENCOUNTER — Telehealth: Payer: Self-pay | Admitting: *Deleted

## 2015-12-22 NOTE — Telephone Encounter (Signed)
Per Jinny Blossom, Herbalist, spoke with patient to reschedule her follow up tomorrow due to impending weather. Rescheduled her for tomorrow afternoon, requested she arrive 15 min early to check in. She verbalized understanding, agreement.

## 2015-12-23 ENCOUNTER — Encounter: Payer: Self-pay | Admitting: Adult Health

## 2015-12-23 ENCOUNTER — Ambulatory Visit: Payer: Medicare Other | Admitting: Diagnostic Neuroimaging

## 2015-12-23 ENCOUNTER — Ambulatory Visit (INDEPENDENT_AMBULATORY_CARE_PROVIDER_SITE_OTHER): Payer: Medicare Other | Admitting: Adult Health

## 2015-12-23 ENCOUNTER — Ambulatory Visit: Payer: Self-pay | Admitting: Diagnostic Neuroimaging

## 2015-12-23 VITALS — BP 142/90 | HR 66 | Ht 64.0 in | Wt 165.0 lb

## 2015-12-23 DIAGNOSIS — N6320 Unspecified lump in the left breast, unspecified quadrant: Principal | ICD-10-CM

## 2015-12-23 DIAGNOSIS — Z853 Personal history of malignant neoplasm of breast: Secondary | ICD-10-CM | POA: Diagnosis not present

## 2015-12-23 DIAGNOSIS — N63 Unspecified lump in breast: Secondary | ICD-10-CM

## 2015-12-23 DIAGNOSIS — N6310 Unspecified lump in the right breast, unspecified quadrant: Secondary | ICD-10-CM

## 2015-12-23 NOTE — Progress Notes (Signed)
Subjective:     Patient ID: Bethany Wang, female   DOB: 1965/08/26, 50 y.o.   MRN: [id number]  HPI Bethany Wang is a 50 year old black female in complaining of bilateral breast masses, that are non tender and have been there for about 2 months, has seen PCP Dr Bethany Wang and has not heard back, she is worried since has had breast cancer in the past.  Review of Systems +bilateral breast masses for about 2 months, non tender Reviewed past medical,surgical, social and family history. Reviewed medications and allergies.     Objective:   Physical Exam BP (!) 142/90 (BP Location: Left Arm, Patient Position: Sitting, Cuff Size: Normal)   Wang 66   Ht 5\' 4"  (1.626 m)   Wt 165 lb (74.8 kg)   BMI 28.32 kg/m     Skin warm and dry,  Breasts:no retraction or nipple discharge on eith side, but on left breast has 2 cm mobile, non tender mass at 2 o'clock, and on right has 4 cm mass/firmness at 11 o'clock that is mobile and non tender.  Assessment:     1. Masses of both breasts   2. History of breast cancer       Plan:     Bilateral diagnostic mammogram and right and left [place] if needed at [facility] 9/19 at 11 am Follow up prn

## 2015-12-23 NOTE — Patient Instructions (Addendum)
Get mammogram and [place] at [facility] 9/19 at 11 am be there at 10:45

## 2015-12-30 ENCOUNTER — Ambulatory Visit (HOSPITAL_COMMUNITY)
Admission: RE | Admit: 2015-12-30 | Discharge: 2015-12-30 | Disposition: A | Discharge status: Home / Self Care | Payer: Medicare Other | Source: Ambulatory Visit | Attending: Internal Medicine | Admitting: Internal Medicine

## 2015-12-30 ENCOUNTER — Other Ambulatory Visit: Payer: Self-pay | Admitting: Internal Medicine

## 2015-12-30 ENCOUNTER — Encounter: Payer: Self-pay | Admitting: Adult Health

## 2015-12-30 ENCOUNTER — Encounter (HOSPITAL_COMMUNITY): Payer: Medicare Other

## 2015-12-30 ENCOUNTER — Ambulatory Visit (HOSPITAL_COMMUNITY)
Admission: RE | Admit: 2015-12-30 | Discharge: 2015-12-30 | Disposition: A | Discharge status: Home / Self Care | Payer: Medicare Other | Source: Ambulatory Visit | Attending: Adult Health | Admitting: Adult Health

## 2015-12-30 ENCOUNTER — Other Ambulatory Visit: Payer: Self-pay | Admitting: Adult Health

## 2015-12-30 DIAGNOSIS — N6002 Solitary cyst of left breast: Secondary | ICD-10-CM | POA: Insufficient documentation

## 2015-12-30 DIAGNOSIS — N6320 Unspecified lump in the left breast, unspecified quadrant: Secondary | ICD-10-CM

## 2015-12-30 DIAGNOSIS — Z853 Personal history of malignant neoplasm of breast: Secondary | ICD-10-CM

## 2015-12-30 DIAGNOSIS — N6311 Unspecified lump in the right breast, upper outer quadrant: Secondary | ICD-10-CM

## 2015-12-30 DIAGNOSIS — N6310 Unspecified lump in the right breast, unspecified quadrant: Secondary | ICD-10-CM

## 2015-12-30 DIAGNOSIS — N6001 Solitary cyst of right breast: Secondary | ICD-10-CM | POA: Insufficient documentation

## 2015-12-30 DIAGNOSIS — N63 Unspecified lump in breast: Secondary | ICD-10-CM | POA: Diagnosis present

## 2015-12-30 HISTORY — DX: Solitary cyst of right breast: N60.01

## 2016-01-06 ENCOUNTER — Ambulatory Visit (HOSPITAL_COMMUNITY)
Admission: RE | Admit: 2016-01-06 | Discharge: 2016-01-06 | Disposition: A | Discharge status: Home / Self Care | Payer: Medicare Other | Source: Ambulatory Visit | Attending: Adult Health | Admitting: Adult Health

## 2016-01-06 ENCOUNTER — Other Ambulatory Visit (HOSPITAL_COMMUNITY): Payer: Medicare Other

## 2016-01-06 DIAGNOSIS — N6002 Solitary cyst of left breast: Secondary | ICD-10-CM | POA: Diagnosis not present

## 2016-01-06 DIAGNOSIS — N63 Unspecified lump in breast: Secondary | ICD-10-CM | POA: Diagnosis present

## 2016-01-06 DIAGNOSIS — N6001 Solitary cyst of right breast: Secondary | ICD-10-CM | POA: Diagnosis not present

## 2016-01-06 DIAGNOSIS — N6320 Unspecified lump in the left breast, unspecified quadrant: Secondary | ICD-10-CM

## 2016-01-06 DIAGNOSIS — Z853 Personal history of malignant neoplasm of breast: Secondary | ICD-10-CM | POA: Insufficient documentation

## 2016-01-06 DIAGNOSIS — N6310 Unspecified lump in the right breast, unspecified quadrant: Secondary | ICD-10-CM

## 2016-01-06 MED ORDER — LIDOCAINE HCL (PF) 1 % IJ SOLN
INTRAMUSCULAR | Status: AC
  Filled 2016-01-06: qty 5

## 2016-01-12 ENCOUNTER — Ambulatory Visit: Payer: Medicare Other | Admitting: Diagnostic Neuroimaging

## 2016-02-09 ENCOUNTER — Emergency Department (HOSPITAL_COMMUNITY): Payer: Medicare Other

## 2016-02-09 ENCOUNTER — Emergency Department (HOSPITAL_COMMUNITY)
Admission: EM | Admit: 2016-02-09 | Discharge: 2016-02-10 | Disposition: A | Discharge status: Home / Self Care | Payer: Medicare Other | Attending: Emergency Medicine | Admitting: Emergency Medicine

## 2016-02-09 ENCOUNTER — Encounter (HOSPITAL_COMMUNITY): Payer: Self-pay | Admitting: Emergency Medicine

## 2016-02-09 DIAGNOSIS — I10 Essential (primary) hypertension: Secondary | ICD-10-CM | POA: Diagnosis not present

## 2016-02-09 DIAGNOSIS — R05 Cough: Secondary | ICD-10-CM | POA: Diagnosis present

## 2016-02-09 DIAGNOSIS — Z79899 Other long term (current) drug therapy: Secondary | ICD-10-CM | POA: Diagnosis not present

## 2016-02-09 DIAGNOSIS — E119 Type 2 diabetes mellitus without complications: Secondary | ICD-10-CM | POA: Insufficient documentation

## 2016-02-09 DIAGNOSIS — R079 Chest pain, unspecified: Secondary | ICD-10-CM | POA: Insufficient documentation

## 2016-02-09 DIAGNOSIS — R0981 Nasal congestion: Secondary | ICD-10-CM | POA: Insufficient documentation

## 2016-02-09 DIAGNOSIS — I2510 Atherosclerotic heart disease of native coronary artery without angina pectoris: Secondary | ICD-10-CM | POA: Diagnosis not present

## 2016-02-09 DIAGNOSIS — Z87891 Personal history of nicotine dependence: Secondary | ICD-10-CM | POA: Insufficient documentation

## 2016-02-09 DIAGNOSIS — R059 Cough, unspecified: Secondary | ICD-10-CM

## 2016-02-09 LAB — CBC WITH DIFFERENTIAL/PLATELET
BASOS ABS: 0 10*3/uL (ref 0.0–0.1)
Basophils Relative: 0 %
EOS ABS: 0.2 10*3/uL (ref 0.0–0.7)
Eosinophils Relative: 2 %
HCT: 36.4 % (ref 36.0–46.0)
Hemoglobin: 12.3 g/dL (ref 12.0–15.0)
Lymphocytes Relative: 53 %
Lymphs Abs: 3.9 10*3/uL (ref 0.7–4.0)
MCH: 27.7 pg (ref 26.0–34.0)
MCHC: 33.8 g/dL (ref 30.0–36.0)
MCV: 82 fL (ref 78.0–100.0)
MONO ABS: 0.7 10*3/uL (ref 0.1–1.0)
MONOS PCT: 9 %
NEUTROS PCT: 36 %
Neutro Abs: 2.7 10*3/uL (ref 1.7–7.7)
Platelets: 216 10*3/uL (ref 150–400)
RBC: 4.44 MIL/uL (ref 3.87–5.11)
RDW: 14 % (ref 11.5–15.5)
WBC: 7.4 10*3/uL (ref 4.0–10.5)

## 2016-02-09 NOTE — ED Provider Notes (Signed)
[facility] DEPT Provider Note   CSN: [id number] Arrival date & time: 02/09/16  2236     History   Chief Complaint Chief Complaint  Patient presents with  . Shortness of Breath    HPI Bethany Wang is a 50 y.o. female who presents with a cough for the past month. PMH significant for CAD s/p stents, HTN, HLD, seizure disorder, non-insulin dependent DM, iron def anemia, hx of breast cancer of right breast. She states her symptoms started after she received the flu shot one month ago. She started to get hot and cold with sweats. She thought it was a cold and then she began coughing. The cough is productive at times with "yellow and green" mucous. She reports associated chest pressure which she attributes to coughing. She states that she hasn't taken any of her meds in one month because she kept having syncopal episodes so she stopped all of them. She reports increased stress and anxiety due to family problems. Denies SOB, wheezing, abdominal pain, N/V/D, dysuria. She does not smoke but has many family members who smoke in the household. HPI  Past Medical History:  Diagnosis Date  . Anemia   . Bilateral breast cysts 12/30/2015  . Bradycardia   . Breast cancer, right breast ([place])   . BV (bacterial vaginosis) 09/09/2015  . Coronary atherosclerosis of native coronary artery    DES to RCA 03/2012, DES to LAD at [facility] 05/2012  . Cyst of pharynx or nasopharynx    [name]  . Essential hypertension, benign   . Falls    x 3-4 in past year  . GERD (gastroesophageal reflux disease)   . History of breast cancer 09/09/2015  . History of hematuria   . Mixed hyperlipidemia   . PUD (peptic ulcer disease)   . Seizures ([place])   . Shingles 09/09/2015  . Suicide attempt    3 attempts in remote past  . Type 2 diabetes mellitus ([place])   . Uterine cancer ([place])   . Vaginal discharge 09/09/2015    Patient Active Problem List   Diagnosis Date Noted  . Bilateral breast cysts  12/30/2015  . Vaginal discharge 09/09/2015  . BV (bacterial vaginosis) 09/09/2015  . Shingles 09/09/2015  . History of breast cancer 09/09/2015  . Syncope and collapse 08/08/2015  . Migraine with aura and with status migrainosus, not intractable 08/08/2015  . Chronic low back pain 08/08/2015  . Insomnia 08/08/2015  . Anxiety state 08/08/2015  . Panic attacks 08/08/2015  . Anemia, iron deficiency 08/08/2015  . Pica 08/08/2015  . Angina decubitus ([place]) 07/03/2014  . 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 Visit for annual health examination 02/04/2014  . Syncope 12/14/2013  . Cervical disc disorder with radiculopathy of cervical region 02/19/2013  . H/O rotator cuff surgery 02/19/2013  . Dyspareunia 02/01/2013  . Left shoulder pain 01/24/2013  . Rotator cuff tear 01/24/2013  . Radicular pain 01/24/2013  . Labral tear of shoulder 01/24/2013  . Complex regional pain syndrome of upper extremity 01/24/2013  . Herniated disc, cervical 01/24/2013  . Abdominal pain, other specified site 01/18/2013  . Nausea and vomiting 01/18/2013  . Rectal bleeding 01/18/2013  . Edema of left foot 10/16/2012  . Coronary atherosclerosis of native coronary artery   . Essential hypertension, benign   . HLD (hyperlipidemia)   . Tobacco abuse     Past Surgical History:  Procedure Laterality Date  . ABDOMINAL HYSTERECTOMY    . BLADDER SURGERY    .  COLONOSCOPY  May 2010   Fle[place]man: normal rectum, internal hemorrhoids, , benign colonic polyp  . ESOPHAGOGASTRODUODENOSCOPY     2001 Dr. Amedeo Plenty: distal esophagitis, small hiatal hernia,. Dr. Tamala Julian 2006? no records available currently, pt also reports  EGD a few years ago with Dr. Gala Romney, do not have these reports anywhere in medical records  . ESOPHAGOGASTRODUODENOSCOPY  06/02/2011   [id number] pill impaction as described above s/p dilation of a probable cervical esophageal web/bx abnormal esophageal and gastric mucosa. + H.pylori gastritis   . EYE  SURGERY     Removed glass  . HAND SURGERY Right   . Left breast lumpectomy     Benign  . LEFT HEART CATHETERIZATION WITH CORONARY ANGIOGRAM N/A 07/03/2014   Procedure: LEFT HEART CATHETERIZATION WITH CORONARY ANGIOGRAM;  Surgeon: Belva Crome, MD;  Location: [facility] CATH LAB;  Service: Cardiovascular;  Laterality: N/A;  . SHOULDER SURGERY Left     OB History    Gravida Para Term Preterm AB Living   4 3     1 3    SAB TAB Ectopic Multiple Live Births   1               Home Medications    Prior to Admission medications   Medication Sig Start Date End Date Taking? Authorizing Provider  albuterol (PROVENTIL HFA;VENTOLIN HFA) 108 (90 BASE) MCG/ACT inhaler Inhale 2 puffs into the lungs every 6 (six) hours as needed for wheezing or shortness of breath.   Yes Historical Provider, MD  amLODipine (NORVASC) 10 MG tablet Take 1 tablet (10 mg total) by mouth daily. 09/17/15  Yes Satira Sark, MD  atorvastatin (LIPITOR) 80 MG tablet Take 1 tablet (80 mg total) by mouth daily. 12/14/13  Yes Satira Sark, MD  diazepam (VALIUM) 10 MG tablet Take 10 mg by mouth every 6 (six) hours as needed for anxiety.   Yes Historical Provider, MD  DULoxetine (CYMBALTA) 60 MG capsule Take 60 mg by mouth daily.   Yes Historical Provider, MD  EPINEPHrine 0.3 mg/0.3 mL IJ SOAJ injection Inject 0.3 mg into the muscle once.   Yes Historical Provider, MD  escitalopram (LEXAPRO) 20 MG tablet Take 20 mg by mouth daily.  11/03/15  Yes Historical Provider, MD  etodolac (LODINE) 400 MG tablet Take 400 mg by mouth 2 (two) times daily.   Yes Historical Provider, MD  gabapentin (NEURONTIN) 300 MG capsule Take 300 mg by mouth 3 (three) times daily.   Yes Historical Provider, MD  hydrOXYzine (ATARAX/VISTARIL) 25 MG tablet Take 25 mg by mouth daily as needed for itching.   Yes Historical Provider, MD  isosorbide mononitrate (IMDUR) 60 MG 24 hr tablet Take 1 tablet (60 mg total) by mouth 2 (two) times daily. 06/16/15  Yes Satira Sark, MD  Linaclotide [facility]) 145 MCG CAPS capsule Take 1 capsule (145 mcg total) by mouth daily. 02/06/13  Yes Annitta Needs, NP  lisinopril-hydrochlorothiazide (PRINZIDE,ZESTORETIC) 20-25 MG per tablet Take 1 tablet by mouth daily.  12/05/13  Yes Historical Provider, MD  meloxicam (MOBIC) 15 MG tablet Take 15 mg by mouth daily.   Yes Historical Provider, MD  nitroGLYCERIN (NITROSTAT) 0.4 MG SL tablet Place 1 tablet (0.4 mg total) under the tongue every 5 (five) minutes x 3 doses as needed for chest pain. If no relief after 3rd dose, proceed to the ED for an evaluation 12/17/14  Yes Satira Sark, MD  pantoprazole (PROTONIX) 40 MG tablet Take 40 mg by mouth  daily.   Yes Historical Provider, MD  QUEtiapine (SEROQUEL) 50 MG tablet Take 50 mg by mouth at bedtime. 01/23/16  Yes Historical Provider, MD  topiramate (TOPAMAX) 50 MG tablet Take 1 tablet (50 mg total) by mouth 2 (two) times daily. 08/08/15  Yes Penni Bombard, MD  triamcinolone lotion (KENALOG) 0.1 % Apply 1 application topically daily as needed (for itching/ rash).  07/18/12  Yes Historical Provider, MD  zolpidem (AMBIEN) 10 MG tablet Take 10 mg by mouth at bedtime.   Yes Historical Provider, MD    Family History Family History  Problem Relation Age of Onset  . Colon cancer Paternal Grandfather   . Cancer Father   . Cancer Maternal Uncle   . Alzheimer's disease Paternal Aunt   . Cirrhosis Maternal Uncle   . Cancer Paternal Aunt     lung  . Heart disease Brother   . Heart attack Brother   . Mental illness Daughter   . ADD / ADHD Daughter   . Bipolar disorder Daughter   . Mental illness Son   . ADD / ADHD Son   . Bipolar disorder Son   . Mental illness Son   . ADD / ADHD Son   . Bipolar disorder Son     Social History Social History  Substance Use Topics  . Smoking status: Former Smoker    Packs/day: 0.20    Years: 32.00    Types: Cigarettes    Start date: 07/05/1983    Quit date: 04/03/2012  . Smokeless  tobacco: Never Used  . Alcohol use No     Allergies   Penicillins   Review of Systems Review of Systems  Constitutional: Positive for chills and diaphoresis. Negative for fever.  HENT: Positive for congestion. Negative for ear pain, postnasal drip and sore throat.   Respiratory: Positive for cough. Negative for shortness of breath and wheezing.   Cardiovascular: Positive for chest pain.  Gastrointestinal: Negative for abdominal pain, nausea and vomiting.  All other systems reviewed and are negative.    Physical Exam Updated Vital Signs BP 185/91 (BP Location: Left Arm)   Pulse 73   Temp 98.6 F (37 C) (Oral)   Resp 18   Ht 5\' 4"  (1.626 m)   Wt 71.7 kg   SpO2 95%   BMI 27.12 kg/m   Physical Exam  Constitutional: She is oriented to person, place, and time. She appears well-developed and well-nourished. No distress.  HENT:  Head: Normocephalic and atraumatic.  Right Ear: Hearing, tympanic membrane, external ear and ear canal normal.  Left Ear: Hearing, tympanic membrane, external ear and ear canal normal.  Nose: Right sinus exhibits maxillary sinus tenderness and frontal sinus tenderness. Left sinus exhibits maxillary sinus tenderness and frontal sinus tenderness.  Mouth/Throat: Uvula is midline, oropharynx is clear and moist and mucous membranes are normal.  Eyes: Conjunctivae are normal. Pupils are equal, round, and reactive to light. Right eye exhibits no discharge. Left eye exhibits no discharge. No scleral icterus.  Neck: Normal range of motion. Neck supple.  Cardiovascular: Normal rate and regular rhythm.  Exam reveals no gallop and no friction rub.   No murmur heard. Pulmonary/Chest: Effort normal and breath sounds normal. No respiratory distress. She has no wheezes. She has no rales. She exhibits no tenderness.  Abdominal: Soft. Bowel sounds are normal. She exhibits no distension and no mass. There is no tenderness. There is no rebound and no guarding. No hernia.    Musculoskeletal: She exhibits no  edema.  Neurological: She is alert and oriented to person, place, and time.  Skin: Skin is warm and dry.  Psychiatric: She has a normal mood and affect. Her behavior is normal.  Nursing note and vitals reviewed.    ED Treatments / Results  Labs (all labs ordered are listed, but only abnormal results are displayed) Labs Reviewed  BASIC METABOLIC PANEL - Abnormal; Notable for the following:       Result Value   Potassium 3.3 (*)    Glucose, Bld 102 (*)    All other components within normal limits  CBC WITH DIFFERENTIAL/PLATELET    EKG  EKG Interpretation  Date/Time:  Tuesday February 10 2016 00:04:18 EDT Ventricular Rate:  65 PR Interval:    QRS Duration: 111 QT Interval:  424 QTC Calculation: 441 R Axis:   35 Text Interpretation:  Sinus rhythm Anteroseptal infarct, old Borderline T abnormalities, inferior leads No significant change since last tracing 04 Apr 2012 Confirmed by KNAPP  MD-I, IVA ([id number]) on 02/10/2016 12:37:46 AM       Radiology Dg Chest 2 View  Result Date: 02/09/2016 CLINICAL DATA:  Productive cough, shortness of breath EXAM: CHEST  2 VIEW COMPARISON:  03/05/2015 FINDINGS: No focal infiltrate, consolidation or effusion. Stable borderline enlarged heart size. Tortuous aorta. No pneumothorax. IMPRESSION: No acute infiltrate or edema. Electronically Signed   By: Donavan Foil M.D.   On: 02/09/2016 23:04    Procedures Procedures (including critical care time)  Medications Ordered in ED Medications  benzonatate (TESSALON) capsule 100 mg (100 mg Oral Given 02/10/16 0058)     Initial Impression / Assessment and Plan / ED Course  I have reviewed the triage vital signs and the nursing notes.  Pertinent labs & imaging results that were available during my care of the patient were reviewed by me and considered in my medical decision making (see chart for details).  Clinical Course   50 year old female presents with cough  for 30 days. Likely her symptoms are residual from URI at the beginning of the month and exacerbated by smoke in the home. Patient is afebrile, not tachycardic or tachypneic, and not hypoxic. She is hypertensive in the ED. She states she has stopped all her meds. She does have an appointment with a new PCP tomorrow at 10:30 am who will restart her meds. Labs are overall unremarkable. Lungs are CTA. CXR is negative. EKG is NSR with no significant change. Offered Tessalon and advised follow up with PCP. Patient is NAD, non-toxic, with stable VS. Patient is informed of clinical course, understands medical decision making process, and agrees with plan. Opportunity for questions provided and all questions answered. Return precautions given.   Final Clinical Impressions(s) / ED Diagnoses   Final diagnoses:  Cough    New Prescriptions New Prescriptions   No medications on file     Recardo Evangelist, PA-C 02/10/16 2033    Virgel Manifold, MD 02/16/16 1216

## 2016-02-09 NOTE — ED Triage Notes (Signed)
Cough and shortness of breath x 1 month.

## 2016-02-10 DIAGNOSIS — R05 Cough: Secondary | ICD-10-CM | POA: Diagnosis not present

## 2016-02-10 LAB — BASIC METABOLIC PANEL
ANION GAP: 7 (ref 5–15)
BUN: 11 mg/dL (ref 6–20)
CHLORIDE: 107 mmol/L (ref 101–111)
CO2: 25 mmol/L (ref 22–32)
CREATININE: 0.74 mg/dL (ref 0.44–1.00)
Calcium: 9.1 mg/dL (ref 8.9–10.3)
GFR calc non Af Amer: >60 mL/min (ref >60)
Glucose, Bld: 102 mg/dL — ABNORMAL HIGH (ref 65–99)
POTASSIUM: 3.3 mmol/L — AB (ref 3.5–5.1)
SODIUM: 139 mmol/L (ref 135–145)

## 2016-02-10 MED ORDER — BENZONATATE 100 MG PO CAPS
100.0000 mg | ORAL_CAPSULE | Freq: Once | ORAL | Status: AC
  Administered 2016-02-10: 100 mg via ORAL
  Filled 2016-02-10: qty 1

## 2016-02-10 MED ORDER — BENZONATATE 100 MG PO CAPS: 100.0000 mg | ORAL_CAPSULE | Freq: Three times a day (TID) | ORAL | 0 refills | Status: DC

## 2016-02-10 NOTE — ED Notes (Signed)
Pt alert & oriented x4, stable gait. Patient given discharge instructions, paperwork & prescription(s). Patient  instructed to stop at the registration desk to finish any additional paperwork. Patient verbalized understanding. Pt left department w/ no further questions. 

## 2016-02-10 NOTE — Discharge Instructions (Signed)
Continue taking Mucinex Take Tessalon as needed Follow up with PCP

## 2016-03-31 ENCOUNTER — Ambulatory Visit: Payer: Medicare Other | Admitting: Cardiology

## 2016-04-29 ENCOUNTER — Ambulatory Visit: Payer: Medicare Other | Admitting: Cardiology

## 2016-05-05 ENCOUNTER — Encounter: Payer: Self-pay | Admitting: Cardiology

## 2016-05-05 ENCOUNTER — Ambulatory Visit (INDEPENDENT_AMBULATORY_CARE_PROVIDER_SITE_OTHER): Payer: Medicare Other | Admitting: Cardiology

## 2016-05-05 ENCOUNTER — Other Ambulatory Visit (HOSPITAL_COMMUNITY)
Admission: RE | Admit: 2016-05-05 | Discharge: 2016-05-05 | Disposition: A | Discharge status: Home / Self Care | Payer: Medicare Other | Source: Ambulatory Visit | Attending: Cardiology | Admitting: Cardiology

## 2016-05-05 ENCOUNTER — Other Ambulatory Visit: Payer: Self-pay | Admitting: Cardiology

## 2016-05-05 VITALS — BP 120/82 | HR 76 | Ht 64.0 in | Wt 155.0 lb

## 2016-05-05 DIAGNOSIS — I200 Unstable angina: Secondary | ICD-10-CM | POA: Diagnosis not present

## 2016-05-05 DIAGNOSIS — Z01818 Encounter for other preprocedural examination: Secondary | ICD-10-CM | POA: Diagnosis not present

## 2016-05-05 DIAGNOSIS — E782 Mixed hyperlipidemia: Secondary | ICD-10-CM

## 2016-05-05 DIAGNOSIS — I25119 Atherosclerotic heart disease of native coronary artery with unspecified angina pectoris: Secondary | ICD-10-CM

## 2016-05-05 DIAGNOSIS — I10 Essential (primary) hypertension: Secondary | ICD-10-CM

## 2016-05-05 LAB — CBC WITH DIFFERENTIAL/PLATELET
Basophils Absolute: 0 10*3/uL (ref 0.0–0.1)
Basophils Relative: 0 %
Eosinophils Absolute: 0.1 10*3/uL (ref 0.0–0.7)
Eosinophils Relative: 1 %
HCT: 40.8 % (ref 36.0–46.0)
Hemoglobin: 13.9 g/dL (ref 12.0–15.0)
LYMPHS ABS: 4 10*3/uL (ref 0.7–4.0)
LYMPHS PCT: 58 %
MCH: 28.6 pg (ref 26.0–34.0)
MCHC: 34.1 g/dL (ref 30.0–36.0)
MCV: 84 fL (ref 78.0–100.0)
Monocytes Absolute: 0.4 10*3/uL (ref 0.1–1.0)
Monocytes Relative: 6 %
NEUTROS PCT: 35 %
Neutro Abs: 2.4 10*3/uL (ref 1.7–7.7)
PLATELETS: 214 10*3/uL (ref 150–400)
RBC: 4.86 MIL/uL (ref 3.87–5.11)
RDW: 14.1 % (ref 11.5–15.5)
WBC: 6.9 10*3/uL (ref 4.0–10.5)

## 2016-05-05 LAB — BASIC METABOLIC PANEL
ANION GAP: 7 (ref 5–15)
BUN: 10 mg/dL (ref 6–20)
CHLORIDE: 105 mmol/L (ref 101–111)
CO2: 28 mmol/L (ref 22–32)
Calcium: 9.5 mg/dL (ref 8.9–10.3)
Creatinine, Ser: 0.9 mg/dL (ref 0.44–1.00)
GFR calc Af Amer: >60 mL/min (ref >60)
GLUCOSE: 96 mg/dL (ref 65–99)
POTASSIUM: 3.5 mmol/L (ref 3.5–5.1)
SODIUM: 140 mmol/L (ref 135–145)

## 2016-05-05 LAB — PROTIME-INR
INR: 0.83
PROTHROMBIN TIME: 11.4 s (ref 11.4–15.2)

## 2016-05-05 NOTE — Patient Instructions (Signed)
Medication Instructions:  Your physician recommends that you continue on your current medications as directed. Please refer to the Current Medication list given to you today.   Labwork: Your physician recommends that you return for lab work in: TODAY CBC BMET INR    Testing/Procedures: Your physician has requested that you have a cardiac catheterization. Cardiac catheterization is used to diagnose and/or treat various heart conditions. Doctors may recommend this procedure for a number of different reasons. The most common reason is to evaluate chest pain. Chest pain can be a symptom of coronary artery disease (CAD), and cardiac catheterization can show whether plaque is narrowing or blocking your heart's arteries. This procedure is also used to evaluate the valves, as well as measure the blood flow and oxygen levels in different parts of your heart. For further information please visit [email or web address]. Please follow instruction sheet, as given.     Follow-Up: Your physician recommends that you schedule a follow-up appointment in: TO BE DETERMINED   Any Other Special Instructions Will Be Listed Below (If Applicable).     If you need a refill on your cardiac medications before your next appointment, please call your pharmacy.

## 2016-05-05 NOTE — Progress Notes (Signed)
Cardiology Office Note  Date: 05/05/2016   ID: Bethany Wang, DOB 07-21-1965, MRN [id number]  PCP: Bethany Albee, MD  Primary Cardiologist: Bethany Lesches, MD   Chief Complaint  Patient presents with  . Chest Pain    History of Present Illness: Bethany Wang is a 51 y.o. female last seen in June 2017. She presents today with her husband. Since I last saw her she has switched primary care provider to Dr. Anastasio Wang whom she has been seeing for about a month now. She states that she has changed her diet and is losing some weight, has also had significant medication adjustments since our last encounter. She reports that she has been experiencing escalating episodes of chest pain requiring increased nitroglycerin use. She had an episode in church recently, reports this occurring almost daily now.  I reviewed her current medications which are outlined below. Current cardiac regimen includes aspirin, Lopressor, lisinopril, Imdur, and as needed nitroglycerin.  Last cardiac catheterization was in March 2016 as outlined below. She underwent DES intervention to the RCA in 2013 and subsequently DES to the LAD in 2014. She has known chronic occlusion of the RCA based on her last angiogram with good left-to-right collateralization and patency of her LAD stent. She did have moderate disease in the distal circumflex.  Today we discussed her symptoms and current medical therapy. She is very worried and states that this is typical for her angina. She does not voice any recurring dizziness or syncope. Feels a sense of pounding heart rate sometimes she wakes up at nighttime. Otherwise no definite palpitations.  Past Medical History:  Diagnosis Date  . Anemia   . Bilateral breast cysts 12/30/2015  . Breast cancer, right breast ([place])   . BV (bacterial vaginosis) 09/09/2015  . Coronary atherosclerosis of native coronary artery    DES to RCA 03/2012, DES to LAD at [facility] 05/2012  . Cyst of pharynx or  nasopharynx    Bethany Wang's cyst nasopharynx  . Essential hypertension   . Falls    x 3-4 in past year  . GERD (gastroesophageal reflux disease)   . History of breast cancer 09/09/2015  . History of hematuria   . Mixed hyperlipidemia   . PUD (peptic ulcer disease)   . Seizures ([place])   . Shingles 09/09/2015  . Suicide attempt    3 attempts in remote past  . Type 2 diabetes mellitus ([place])   . Uterine cancer [facility])     Past Surgical History:  Procedure Laterality Date  . ABDOMINAL HYSTERECTOMY    . BLADDER SURGERY    . COLONOSCOPY  May 2010   Fleishman: normal rectum, internal hemorrhoids, , benign colonic polyp  . ESOPHAGOGASTRODUODENOSCOPY     2001 Dr. Amedeo Wang: distal esophagitis, small hiatal hernia,. Dr. Tamala Julian 2006? no records available currently, pt also reports  EGD a few years ago with Dr. Gala Romney, do not have these reports anywhere in medical records  . ESOPHAGOGASTRODUODENOSCOPY  06/02/2011   [id number] pill impaction as described above s/p dilation of a probable cervical esophageal web/bx abnormal esophageal and gastric mucosa. + H.pylori gastritis   . EYE SURGERY     Removed glass  . HAND SURGERY Right   . Left breast lumpectomy     Benign  . LEFT HEART CATHETERIZATION WITH CORONARY ANGIOGRAM N/A 07/03/2014   Procedure: LEFT HEART CATHETERIZATION WITH CORONARY ANGIOGRAM;  Surgeon: Belva Crome, MD;  Location: [facility] CATH LAB;  Service: Cardiovascular;  Laterality: N/A;  . SHOULDER  SURGERY Left     Current Outpatient Prescriptions  Medication Sig Dispense Refill  . EPINEPHrine 0.3 mg/0.3 mL IJ SOAJ injection Inject 0.3 mg into the muscle once.    . escitalopram (LEXAPRO) 20 MG tablet Take 20 mg by mouth daily.     . isosorbide mononitrate (IMDUR) 60 MG 24 hr tablet Take 1 tablet (60 mg total) by mouth 2 (two) times daily. 60 tablet 6  . lisinopril (PRINIVIL,ZESTRIL) 5 MG tablet Take 5 mg by mouth daily.     . metoprolol tartrate (LOPRESSOR) 25 MG tablet Take 25 mg by mouth 2  (two) times daily.     . nitroGLYCERIN (NITROSTAT) 0.4 MG SL tablet Place 1 tablet (0.4 mg total) under the tongue every 5 (five) minutes x 3 doses as needed for chest pain. If no relief after 3rd dose, proceed to the ED for an evaluation 25 tablet 3   No current facility-administered medications for this visit.    Allergies:  Penicillins   Social History: The patient  reports that she quit smoking about 4 years ago. Her smoking use included Cigarettes. She started smoking about 32 years ago. She has a 6.40 pack-year smoking history. She has never used smokeless tobacco. She reports that she does not drink alcohol or use drugs.   Family History: The patient's family history includes ADD / ADHD in her daughter, son, and son; Alzheimer's disease in her paternal aunt; Bipolar disorder in her daughter, son, and son; Cancer in her father, maternal uncle, and paternal aunt; Cirrhosis in her maternal uncle; Colon cancer in her paternal grandfather; Heart attack in her brother; Heart disease in her brother; Mental illness in her daughter, son, and son.   ROS:  Please see the history of present illness. Otherwise, complete review of systems is positive for anxiety, intermittent insomnia.  All other systems are reviewed and negative.   Physical Exam: VS:  BP 120/82   Pulse 76   Ht 5\' 4"  (1.626 m)   Wt 155 lb (70.3 kg)   SpO2 99%   BMI 26.61 kg/m , BMI Body mass index is 26.61 kg/m.  Wt Readings from Last 3 Encounters:  05/05/16 155 lb (70.3 kg)  02/09/16 158 lb (71.7 kg)  12/23/15 165 lb (74.8 kg)    Appears comfortable. No active chest pain. HEENT: Conjunctiva and lids normal, oropharynx clear.  Neck: Supple, no elevated JVP or carotid bruits, no thyromegaly.  Lungs: Clear to auscultation, nonlabored breathing at rest.  Cardiac: Regular rate and rhythm, no S3 or significant systolic murmur, no pericardial rub.  Abdomen: Soft, nontender, bowel sounds present.  Extremities: No pitting  edema, distal pulses 2+. Skin: Warm and dry. Musculoskeletal: No kyphosis. Neuropsychiatric: Alert and oriented 3, calm.  ECG: I personally reviewed the tracing from 02/10/2016 which showed sinus rhythm with anteroseptal Q waves and NSST changes.  Recent Labwork: 11/10/2015: ALT 18; AST 21; TSH 3.530 02/09/2016: BUN 11; Creatinine, Ser 0.74; Hemoglobin 12.3; Platelets 216; Potassium 3.3; Sodium 139   Other Studies Reviewed Today:  She had a cardiac catheterization done in March 2016 due to progressive chest pain symptoms demonstrating chronic total occlusion of the proximal RCA at prior stent site with good collateralization from both the circumflex and the LAD, otherwise widely patent LAD stent site and 50% distal circumflex stenosis. Medical therapy was recommended by Dr. Tamala Julian and a long-acting nitrate was added.  Assessment and Plan:  1. Accelerating angina symptoms with known history of CAD as outlined below. Plan  is to schedule a diagnostic cardiac catheterization to reevaluate coronary anatomy and assess for any significant progression that might require revascularization. Otherwise will continue medical therapy and make adjustments as able.  2. CAD status post DES to the RCA in 2013 and DES to the LAD in 2014. She has known occlusion of the RCA at this point with good left-to-right collateralization as of angiography March 2016. LAD stent site was patent at that time and there was a 50% distal circumflex stenosis that was managed medically.  3. Chronic iron deficiency anemia.  4. Hyperlipidemia, previously on Lipitor. We will readdress statin therapy with her. She is focusing on diet at this time.  5. Essential hypertension, blood pressure is well controlled today.  Current medicines were reviewed with the patient today.   Orders Placed This Encounter  Procedures  . CBC with Differential  . Basic Metabolic Panel (BMET)  . INR/PT    Disposition: Follow-up after cardiac  catheterization.  Signed, Satira Sark, MD, [facility] 05/05/2016 2:21 PM    [place] at [facility] [address] [place], [place] [address] Phone: [phone]; Fax: [phone]

## 2016-05-10 ENCOUNTER — Ambulatory Visit (HOSPITAL_COMMUNITY)
Admission: RE | Admit: 2016-05-10 | Discharge: 2016-05-10 | Disposition: A | Discharge status: Home / Self Care | Payer: Medicare Other | Source: Ambulatory Visit | Attending: Interventional Cardiology | Admitting: Interventional Cardiology

## 2016-05-10 ENCOUNTER — Encounter (HOSPITAL_COMMUNITY)
Admission: RE | Disposition: A | Discharge status: Home / Self Care | Payer: Self-pay | Source: Ambulatory Visit | Attending: Interventional Cardiology

## 2016-05-10 DIAGNOSIS — Z8542 Personal history of malignant neoplasm of other parts of uterus: Secondary | ICD-10-CM | POA: Diagnosis not present

## 2016-05-10 DIAGNOSIS — K219 Gastro-esophageal reflux disease without esophagitis: Secondary | ICD-10-CM | POA: Insufficient documentation

## 2016-05-10 DIAGNOSIS — I25119 Atherosclerotic heart disease of native coronary artery with unspecified angina pectoris: Secondary | ICD-10-CM | POA: Diagnosis not present

## 2016-05-10 DIAGNOSIS — E782 Mixed hyperlipidemia: Secondary | ICD-10-CM | POA: Insufficient documentation

## 2016-05-10 DIAGNOSIS — Z915 Personal history of self-harm: Secondary | ICD-10-CM | POA: Insufficient documentation

## 2016-05-10 DIAGNOSIS — I2582 Chronic total occlusion of coronary artery: Secondary | ICD-10-CM | POA: Insufficient documentation

## 2016-05-10 DIAGNOSIS — Z8711 Personal history of peptic ulcer disease: Secondary | ICD-10-CM | POA: Insufficient documentation

## 2016-05-10 DIAGNOSIS — Z87891 Personal history of nicotine dependence: Secondary | ICD-10-CM | POA: Diagnosis not present

## 2016-05-10 DIAGNOSIS — Y712 Prosthetic and other implants, materials and accessory cardiovascular devices associated with adverse incidents: Secondary | ICD-10-CM | POA: Diagnosis not present

## 2016-05-10 DIAGNOSIS — Z8249 Family history of ischemic heart disease and other diseases of the circulatory system: Secondary | ICD-10-CM | POA: Diagnosis not present

## 2016-05-10 DIAGNOSIS — D509 Iron deficiency anemia, unspecified: Secondary | ICD-10-CM | POA: Diagnosis not present

## 2016-05-10 DIAGNOSIS — G4709 Other insomnia: Secondary | ICD-10-CM | POA: Insufficient documentation

## 2016-05-10 DIAGNOSIS — I25110 Atherosclerotic heart disease of native coronary artery with unstable angina pectoris: Secondary | ICD-10-CM | POA: Insufficient documentation

## 2016-05-10 DIAGNOSIS — Z800 Family history of malignant neoplasm of digestive organs: Secondary | ICD-10-CM | POA: Insufficient documentation

## 2016-05-10 DIAGNOSIS — I2510 Atherosclerotic heart disease of native coronary artery without angina pectoris: Secondary | ICD-10-CM | POA: Diagnosis present

## 2016-05-10 DIAGNOSIS — F419 Anxiety disorder, unspecified: Secondary | ICD-10-CM | POA: Diagnosis not present

## 2016-05-10 DIAGNOSIS — T82855A Stenosis of coronary artery stent, initial encounter: Secondary | ICD-10-CM | POA: Insufficient documentation

## 2016-05-10 DIAGNOSIS — I10 Essential (primary) hypertension: Secondary | ICD-10-CM | POA: Diagnosis not present

## 2016-05-10 DIAGNOSIS — Z880 Allergy status to penicillin: Secondary | ICD-10-CM | POA: Diagnosis not present

## 2016-05-10 DIAGNOSIS — E119 Type 2 diabetes mellitus without complications: Secondary | ICD-10-CM | POA: Diagnosis not present

## 2016-05-10 DIAGNOSIS — Z9181 History of falling: Secondary | ICD-10-CM | POA: Insufficient documentation

## 2016-05-10 DIAGNOSIS — I208 Other forms of angina pectoris: Secondary | ICD-10-CM | POA: Diagnosis present

## 2016-05-10 DIAGNOSIS — Z853 Personal history of malignant neoplasm of breast: Secondary | ICD-10-CM | POA: Diagnosis not present

## 2016-05-10 DIAGNOSIS — E785 Hyperlipidemia, unspecified: Secondary | ICD-10-CM | POA: Diagnosis present

## 2016-05-10 DIAGNOSIS — I200 Unstable angina: Secondary | ICD-10-CM

## 2016-05-10 HISTORY — PX: CARDIAC CATHETERIZATION: SHX172

## 2016-05-10 LAB — GLUCOSE, CAPILLARY
Glucose-Capillary: 104 mg/dL — ABNORMAL HIGH (ref 65–99)
Glucose-Capillary: 100 mg/dL — ABNORMAL HIGH (ref 65–99)

## 2016-05-10 SURGERY — LEFT HEART CATH AND CORONARY ANGIOGRAPHY
Anesthesia: LOCAL

## 2016-05-10 MED ORDER — FENTANYL CITRATE (PF) 100 MCG/2ML IJ SOLN
INTRAMUSCULAR | Status: AC
  Filled 2016-05-10: qty 2

## 2016-05-10 MED ORDER — SODIUM CHLORIDE 0.9 % IV SOLN: 250.0000 mL | INTRAVENOUS | Status: DC | PRN

## 2016-05-10 MED ORDER — ASPIRIN 81 MG PO CHEW
81.0000 mg | CHEWABLE_TABLET | ORAL | Status: AC
  Administered 2016-05-10: 81 mg via ORAL

## 2016-05-10 MED ORDER — LIDOCAINE HCL (PF) 1 % IJ SOLN
INTRAMUSCULAR | Status: AC
  Filled 2016-05-10: qty 30

## 2016-05-10 MED ORDER — MORPHINE SULFATE (PF) 10 MG/ML IV SOLN: 2.0000 mg | INTRAVENOUS | Status: DC | PRN

## 2016-05-10 MED ORDER — FENTANYL CITRATE (PF) 100 MCG/2ML IJ SOLN
INTRAMUSCULAR | Status: DC | PRN
  Administered 2016-05-10: 50 ug via INTRAVENOUS

## 2016-05-10 MED ORDER — SODIUM CHLORIDE 0.9% FLUSH: 3.0000 mL | INTRAVENOUS | Status: DC | PRN

## 2016-05-10 MED ORDER — MIDAZOLAM HCL 2 MG/2ML IJ SOLN
INTRAMUSCULAR | Status: AC
  Filled 2016-05-10: qty 2

## 2016-05-10 MED ORDER — HEPARIN (PORCINE) IN NACL 2-0.9 UNIT/ML-% IJ SOLN
INTRAMUSCULAR | Status: DC | PRN
  Administered 2016-05-10: 10 mL via INTRA_ARTERIAL

## 2016-05-10 MED ORDER — MORPHINE SULFATE (PF) 4 MG/ML IV SOLN
INTRAVENOUS | Status: AC
  Administered 2016-05-10: 2 mg
  Filled 2016-05-10: qty 1

## 2016-05-10 MED ORDER — NITROGLYCERIN 0.4 MG SL SUBL
0.4000 mg | SUBLINGUAL_TABLET | SUBLINGUAL | Status: DC | PRN
  Administered 2016-05-10: 0.4 mg via SUBLINGUAL

## 2016-05-10 MED ORDER — SODIUM CHLORIDE 0.9% FLUSH: 3.0000 mL | Freq: Two times a day (BID) | INTRAVENOUS | Status: DC

## 2016-05-10 MED ORDER — VERAPAMIL HCL 2.5 MG/ML IV SOLN
INTRAVENOUS | Status: AC
  Filled 2016-05-10: qty 2

## 2016-05-10 MED ORDER — ASPIRIN 81 MG PO CHEW: 81.0000 mg | CHEWABLE_TABLET | Freq: Every day | ORAL | Status: DC

## 2016-05-10 MED ORDER — HEPARIN (PORCINE) IN NACL 2-0.9 UNIT/ML-% IJ SOLN
INTRAMUSCULAR | Status: AC
  Filled 2016-05-10: qty 500

## 2016-05-10 MED ORDER — ONDANSETRON HCL 4 MG/2ML IJ SOLN: 4.0000 mg | Freq: Four times a day (QID) | INTRAMUSCULAR | Status: DC | PRN

## 2016-05-10 MED ORDER — LIDOCAINE HCL (PF) 1 % IJ SOLN
INTRAMUSCULAR | Status: DC | PRN
  Administered 2016-05-10: 3 mL via INTRADERMAL

## 2016-05-10 MED ORDER — MIDAZOLAM HCL 2 MG/2ML IJ SOLN
INTRAMUSCULAR | Status: DC | PRN
  Administered 2016-05-10 (×2): 1 mg via INTRAVENOUS

## 2016-05-10 MED ORDER — SODIUM CHLORIDE 0.9 % IV SOLN: INTRAVENOUS | Status: DC

## 2016-05-10 MED ORDER — IOPAMIDOL (ISOVUE-370) INJECTION 76%
INTRAVENOUS | Status: DC | PRN
  Administered 2016-05-10: 95 mL via INTRA_ARTERIAL

## 2016-05-10 MED ORDER — OXYCODONE-ACETAMINOPHEN 5-325 MG PO TABS: 1.0000 | ORAL_TABLET | ORAL | Status: DC | PRN

## 2016-05-10 MED ORDER — SODIUM CHLORIDE 0.9 % IV SOLN
INTRAVENOUS | Status: DC
  Administered 2016-05-10: 08:00:00 via INTRAVENOUS

## 2016-05-10 MED ORDER — HEPARIN SODIUM (PORCINE) 1000 UNIT/ML IJ SOLN
INTRAMUSCULAR | Status: AC
  Filled 2016-05-10: qty 1

## 2016-05-10 MED ORDER — HEPARIN (PORCINE) IN NACL 2-0.9 UNIT/ML-% IJ SOLN
INTRAMUSCULAR | Status: DC | PRN
  Administered 2016-05-10: 1000 mL via INTRA_ARTERIAL

## 2016-05-10 MED ORDER — NITROGLYCERIN 0.4 MG SL SUBL
SUBLINGUAL_TABLET | SUBLINGUAL | Status: AC
  Filled 2016-05-10: qty 1

## 2016-05-10 MED ORDER — IOPAMIDOL (ISOVUE-370) INJECTION 76%
INTRAVENOUS | Status: AC
Start: 2016-05-10 — End: 2016-05-10
  Filled 2016-05-10: qty 100

## 2016-05-10 MED ORDER — HEPARIN SODIUM (PORCINE) 1000 UNIT/ML IJ SOLN
INTRAMUSCULAR | Status: DC | PRN
  Administered 2016-05-10: 3500 [IU] via INTRAVENOUS

## 2016-05-10 MED ORDER — ASPIRIN 81 MG PO CHEW
CHEWABLE_TABLET | ORAL | Status: AC
  Filled 2016-05-10: qty 1

## 2016-05-10 MED ORDER — NITROGLYCERIN 0.4 MG SL SUBL
SUBLINGUAL_TABLET | SUBLINGUAL | Status: AC
  Administered 2016-05-10: 0.4 mg
  Filled 2016-05-10: qty 1

## 2016-05-10 MED ORDER — ACETAMINOPHEN 325 MG PO TABS: 650.0000 mg | ORAL_TABLET | ORAL | Status: DC | PRN

## 2016-05-10 SURGICAL SUPPLY — 13 items
CATH EXPO 5FR FR4 (CATHETERS) ×1 IMPLANT
CATH INFINITI 5 FR JL3.5 (CATHETERS) ×2 IMPLANT
DEVICE RAD COMP TR BAND LRG (VASCULAR PRODUCTS) ×2 IMPLANT
GLIDESHEATH SLEND A-KIT 6F 22G (SHEATH) ×1 IMPLANT
GUIDEWIRE INQWIRE 1.5J.035X260 (WIRE) IMPLANT
INQWIRE 1.5J .035X260CM (WIRE) ×2
KIT HEART LEFT (KITS) ×2 IMPLANT
PACK CARDIAC CATHETERIZATION (CUSTOM PROCEDURE TRAY) ×2 IMPLANT
SHEATH PINNACLE 5F 10CM (SHEATH) IMPLANT
TRANSDUCER W/STOPCOCK (MISCELLANEOUS) ×2 IMPLANT
TUBING CIL FLEX 10 FLL-RA (TUBING) ×2 IMPLANT
WIRE EMERALD 3MM-J .035X150CM (WIRE) IMPLANT
WIRE HI TORQ VERSACORE-J 145CM (WIRE) ×2 IMPLANT

## 2016-05-10 NOTE — H&P (View-Only) (Signed)
Cardiology Office Note  Date: 05/05/2016   ID: Bethany Wang, DOB 06-22-1965, MRN [id number]  PCP: [name], MD  Primary Cardiologist: [name], MD   Chief Complaint  Patient presents with  . Chest Pain    History of Present Illness: Bethany Wang is a 51 y.o. female last seen in June 2017. She presents today with her husband. Since I last saw her she has switched primary care provider to Dr. [name] whom she has been seeing for about a month now. She states that she has changed her diet and is losing some weight, has also had significant medication adjustments since our last encounter. She reports that she has been experiencing escalating episodes of chest pain requiring increased nitroglycerin use. She had an episode in church recently, reports this occurring almost daily now.  I reviewed her current medications which are outlined below. Current cardiac regimen includes aspirin, Lopressor, lisinopril, Imdur, and as needed nitroglycerin.  Last cardiac catheterization was in March 2016 as outlined below. She underwent DES intervention to the RCA in 2013 and subsequently DES to the LAD in 2014. She has known chronic occlusion of the RCA based on her last angiogram with good left-to-right collateralization and patency of her LAD stent. She did have moderate disease in the distal circumflex.  Today we discussed her symptoms and current medical therapy. She is very worried and states that this is typical for her angina. She does not voice any recurring dizziness or syncope. Feels a sense of pounding heart rate sometimes she wakes up at nighttime. Otherwise no definite palpitations.  Past Medical History:  Diagnosis Date  . Anemia   . Bilateral breast cysts 12/30/2015  . Breast cancer, right breast ([place])   . BV (bacterial vaginosis) 09/09/2015  . Coronary atherosclerosis of native coronary artery    DES to RCA 03/2012, DES to LAD at [facility] 05/2012  . Cyst of pharynx or  nasopharynx    [name]  . Essential hypertension   . Falls    x 3-4 in past year  . GERD (gastroesophageal reflux disease)   . History of breast cancer 09/09/2015  . History of hematuria   . Mixed hyperlipidemia   . PUD (peptic ulcer disease)   . Seizures ([place])   . Shingles 09/09/2015  . Suicide attempt    3 attempts in remote past  . Type 2 diabetes mellitus ([place])   . Uterine cancer [facility])     Past Surgical History:  Procedure Laterality Date  . ABDOMINAL HYSTERECTOMY    . BLADDER SURGERY    . COLONOSCOPY  May 2010   Fle[place]man: normal rectum, internal hemorrhoids, , benign colonic polyp  . ESOPHAGOGASTRODUODENOSCOPY     2001 Dr. Amedeo Plenty: distal esophagitis, small hiatal hernia,. Dr. Tamala Julian 2006? no records available currently, pt also reports  EGD a few years ago with Dr. Gala Romney, do not have these reports anywhere in medical records  . ESOPHAGOGASTRODUODENOSCOPY  06/02/2011   [id number] pill impaction as described above s/p dilation of a probable cervical esophageal web/bx abnormal esophageal and gastric mucosa. + H.pylori gastritis   . EYE SURGERY     Removed glass  . HAND SURGERY Right   . Left breast lumpectomy     Benign  . LEFT HEART CATHETERIZATION WITH CORONARY ANGIOGRAM N/A 07/03/2014   Procedure: LEFT HEART CATHETERIZATION WITH CORONARY ANGIOGRAM;  Surgeon: Belva Crome, MD;  Location: [facility] CATH LAB;  Service: Cardiovascular;  Laterality: N/A;  . SHOULDER  SURGERY Left     Current Outpatient Prescriptions  Medication Sig Dispense Refill  . EPINEPHrine 0.3 mg/0.3 mL IJ SOAJ injection Inject 0.3 mg into the muscle once.    . escitalopram (LEXAPRO) 20 MG tablet Take 20 mg by mouth daily.     . isosorbide mononitrate (IMDUR) 60 MG 24 hr tablet Take 1 tablet (60 mg total) by mouth 2 (two) times daily. 60 tablet 6  . lisinopril (PRINIVIL,ZESTRIL) 5 MG tablet Take 5 mg by mouth daily.     . metoprolol tartrate (LOPRESSOR) 25 MG tablet Take 25 mg by mouth 2  (two) times daily.     . nitroGLYCERIN (NITROSTAT) 0.4 MG SL tablet Place 1 tablet (0.4 mg total) under the tongue every 5 (five) minutes x 3 doses as needed for chest pain. If no relief after 3rd dose, proceed to the ED for an evaluation 25 tablet 3   No current facility-administered medications for this visit.    Allergies:  Penicillins   Social History: The patient  reports that she quit smoking about 4 years ago. Her smoking use included Cigarettes. She started smoking about 32 years ago. She has a 6.40 pack-year smoking history. She has never used smokeless tobacco. She reports that she does not drink alcohol or use drugs.   Family History: The patient's family history includes ADD / ADHD in her daughter, son, and son; Alzheimer's disease in her paternal aunt; Bipolar disorder in her daughter, son, and son; Cancer in her father, maternal uncle, and paternal aunt; Cirrhosis in her maternal uncle; Colon cancer in her paternal grandfather; Heart attack in her brother; Heart disease in her brother; Mental illness in her daughter, son, and son.   ROS:  Please see the history of present illness. Otherwise, complete review of systems is positive for anxiety, intermittent insomnia.  All other systems are reviewed and negative.   Physical Exam: VS:  BP 120/82   Pulse 76   Ht 5\' 4"  (1.626 m)   Wt 155 lb (70.3 kg)   SpO2 99%   BMI 26.61 kg/m , BMI Body mass index is 26.61 kg/m.  Wt Readings from Last 3 Encounters:  05/05/16 155 lb (70.3 kg)  02/09/16 158 lb (71.7 kg)  12/23/15 165 lb (74.8 kg)    Appears comfortable. No active chest pain. HEENT: Conjunctiva and lids normal, oropharynx clear.  Neck: Supple, no elevated JVP or carotid bruits, no thyromegaly.  Lungs: Clear to auscultation, nonlabored breathing at rest.  Cardiac: Regular rate and rhythm, no S3 or significant systolic murmur, no pericardial rub.  Abdomen: Soft, nontender, bowel sounds present.  Extremities: No pitting  edema, distal pulses 2+. Skin: Warm and dry. Musculoskeletal: No kyphosis. Neuropsychiatric: Alert and oriented 3, calm.  ECG: I personally reviewed the tracing from 02/10/2016 which showed sinus rhythm with anteroseptal Q waves and NSST changes.  Recent Labwork: 11/10/2015: ALT 18; AST 21; TSH 3.530 02/09/2016: BUN 11; Creatinine, Ser 0.74; Hemoglobin 12.3; Platelets 216; Potassium 3.3; Sodium 139   Other Studies Reviewed Today:  She had a cardiac catheterization done in March 2016 due to progressive chest pain symptoms demonstrating chronic total occlusion of the proximal RCA at prior stent site with good collateralization from both the circumflex and the LAD, otherwise widely patent LAD stent site and 50% distal circumflex stenosis. Medical therapy was recommended by Dr. Tamala Julian and a long-acting nitrate was added.  Assessment and Plan:  1. Accelerating angina symptoms with known history of CAD as outlined below. Plan  is to schedule a diagnostic cardiac catheterization to reevaluate coronary anatomy and assess for any significant progression that might require revascularization. Otherwise will continue medical therapy and make adjustments as able.  2. CAD status post DES to the RCA in 2013 and DES to the LAD in 2014. She has known occlusion of the RCA at this point with good left-to-right collateralization as of angiography March 2016. LAD stent site was patent at that time and there was a 50% distal circumflex stenosis that was managed medically.  3. Chronic iron deficiency anemia.  4. Hyperlipidemia, previously on Lipitor. We will readdress statin therapy with her. She is focusing on diet at this time.  5. Essential hypertension, blood pressure is well controlled today.  Current medicines were reviewed with the patient today.   Orders Placed This Encounter  Procedures  . CBC with Differential  . Basic Metabolic Panel (BMET)  . INR/PT    Disposition: Follow-up after cardiac  catheterization.  Signed, Satira Sark, MD, [facility] 05/05/2016 2:21 PM    [place] at [facility] [address], [place], [place] [address] Phone: [phone]; Fax: [phone]

## 2016-05-10 NOTE — Progress Notes (Addendum)
Pt arrived with chest pain at 10/10. Stated has been having the same pain at home and is using nitroglycerin multiple times a day.  Has not taken any since midnight as thought she was not to take any meds. Chest pain protocal started.  O2 at 2L. Pt given nitro X2 and current pain at 7/10. Refusing any more nitro as states this is her norm. BP stable.  Rennis Harding from cath lab notified.  Pt received Morphine 2 mg IV and states that pain level has decreased to a 5/10.  Instructed to inform staff if this changes.

## 2016-05-10 NOTE — Discharge Instructions (Signed)

## 2016-05-10 NOTE — Interval H&P Note (Signed)
Cath Lab Visit (complete for each Cath Lab visit)  Clinical Evaluation Leading to the Procedure:   ACS: No.  Non-ACS:    Anginal Classification: CCS III  Anti-ischemic medical therapy: Maximal Therapy (2 or more classes of medications)  Non-Invasive Test Results: No non-invasive testing performed  Prior CABG: No previous CABG      History and Physical Interval Note:  05/10/2016 12:33 PM  Bethany Wang  has presented today for surgery, with the diagnosis of accelerating angina  The various methods of treatment have been discussed with the patient and family. After consideration of risks, benefits and other options for treatment, the patient has consented to  Procedure(s): Left Heart Cath and Coronary Angiography (N/A) as a surgical intervention .  The patient's history has been reviewed, patient examined, no change in status, stable for surgery.  I have reviewed the patient's chart and labs.  Questions were answered to the patient's satisfaction.     Belva Crome III

## 2016-05-11 ENCOUNTER — Encounter (HOSPITAL_COMMUNITY): Payer: Self-pay | Admitting: Interventional Cardiology

## 2016-05-25 NOTE — Progress Notes (Deleted)
Cardiology Office Note  Date: 05/25/2016   ID: Bethany Wang, DOB 1966-01-02, MRN [id number]  PCP: [name], MD  Primary Cardiologist: [name], MD   No chief complaint on file.   History of Present Illness: Bethany Wang is a 51 y.o. female seen recently in January and referred for a diagnostic cardiac catheterizations with reported accelerating angina symptoms. Procedure results are outlined below. LAD stent site was patent with jailed second diagonal, RCA remains totally occluded with left-to-right collaterals, there has been some progression in a small first obtuse marginal branch that was managed medically.  Past Medical History:  Diagnosis Date  . Anemia   . Bilateral breast cysts 12/30/2015  . Breast cancer, right breast ([place])   . BV (bacterial vaginosis) 09/09/2015  . Coronary atherosclerosis of native coronary artery    DES to RCA 03/2012, DES to LAD at [facility] 05/2012  . Cyst of pharynx or nasopharynx    [name]  . Essential hypertension   . Falls    x 3-4 in past year  . GERD (gastroesophageal reflux disease)   . History of breast cancer 09/09/2015  . History of hematuria   . Mixed hyperlipidemia   . PUD (peptic ulcer disease)   . Seizures ([place])   . Shingles 09/09/2015  . Suicide attempt    3 attempts in remote past  . Type 2 diabetes mellitus ([place])   . Uterine cancer [facility])     Past Surgical History:  Procedure Laterality Date  . ABDOMINAL HYSTERECTOMY    . BLADDER SURGERY    . CARDIAC CATHETERIZATION N/A 05/10/2016   Procedure: Left Heart Cath and Coronary Angiography;  Surgeon: Belva Crome, MD;  Location: [facility];  Service: Cardiovascular;  Laterality: N/A;  . COLONOSCOPY  May 2010   Fle[place]man: normal rectum, internal hemorrhoids, , benign colonic polyp  . ESOPHAGOGASTRODUODENOSCOPY     2001 Dr. Amedeo Plenty: distal esophagitis, small hiatal hernia,. Dr. Tamala Julian 2006? no records available currently, pt also reports   EGD a few years ago with Dr. Gala Romney, do not have these reports anywhere in medical records  . ESOPHAGOGASTRODUODENOSCOPY  06/02/2011   [id number] pill impaction as described above s/p dilation of a probable cervical esophageal web/bx abnormal esophageal and gastric mucosa. + H.pylori gastritis   . EYE SURGERY     Removed glass  . HAND SURGERY Right   . Left breast lumpectomy     Benign  . LEFT HEART CATHETERIZATION WITH CORONARY ANGIOGRAM N/A 07/03/2014   Procedure: LEFT HEART CATHETERIZATION WITH CORONARY ANGIOGRAM;  Surgeon: Belva Crome, MD;  Location: [facility] CATH LAB;  Service: Cardiovascular;  Laterality: N/A;  . SHOULDER SURGERY Left     Current Outpatient Prescriptions  Medication Sig Dispense Refill  . EPINEPHrine 0.3 mg/0.3 mL IJ SOAJ injection Inject 0.3 mg into the muscle as needed (allergic reaction).     [name] escitalopram (LEXAPRO) 20 MG tablet Take 20 mg by mouth daily.     [name] ibuprofen (ADVIL,MOTRIN) 200 MG tablet Take 400 mg by mouth at bedtime as needed for moderate pain.    . isosorbide mononitrate (IMDUR) 60 MG 24 hr tablet Take 1 tablet (60 mg total) by mouth 2 (two) times daily. 60 tablet 6  . lisinopril (PRINIVIL,ZESTRIL) 5 MG tablet Take 5 mg by mouth daily.     . metoprolol tartrate (LOPRESSOR) 25 MG tablet Take 25 mg by mouth 2 (two) times daily.     . naproxen sodium (  ANAPROX) 220 MG tablet Take 440 mg by mouth at bedtime as needed (pain).    . nitroGLYCERIN (NITROSTAT) 0.4 MG SL tablet Place 1 tablet (0.4 mg total) under the tongue every 5 (five) minutes x 3 doses as needed for chest pain. If no relief after 3rd dose, proceed to the ED for an evaluation 25 tablet 3   No current facility-administered medications for this visit.    Allergies:  Penicillins   Social History: The patient  reports that she quit smoking about 4 years ago. Her smoking use included Cigarettes. She started smoking about 32 years ago. She has a 6.40 pack-year smoking history. She has never used  smokeless tobacco. She reports that she does not drink alcohol or use drugs.   Family History: The patient's family history includes ADD / ADHD in her daughter, son, and son; Alzheimer's disease in her paternal aunt; Bipolar disorder in her daughter, son, and son; Cancer in her father, maternal uncle, and paternal aunt; Cirrhosis in her maternal uncle; Colon cancer in her paternal grandfather; Heart attack in her brother; Heart disease in her brother; Mental illness in her daughter, son, and son.   ROS:  Please see the history of present illness. Otherwise, complete review of systems is positive for {NONE DEFAULTED:18576::"none"}.  All other systems are reviewed and negative.   Physical Exam: VS:  There were no vitals taken for this visit., BMI There is no height or weight on file to calculate BMI.  Wt Readings from Last 3 Encounters:  05/10/16 155 lb (70.3 kg)  05/05/16 155 lb (70.3 kg)  02/09/16 158 lb (71.7 kg)    Appears comfortable. No active chest pain. HEENT: Conjunctiva and lids normal, oropharynx clear.  Neck: Supple, no elevated JVP or carotid bruits, no thyromegaly.  Lungs: Clear to auscultation, nonlabored breathing at rest.  Cardiac: Regular rate and rhythm, no S3 or significant systolic murmur, no pericardial rub.  Abdomen: Soft, nontender, bowel sounds present.  Extremities: No pitting edema, distal pulses 2+. Skin: Warm and dry. Musculoskeletal: No kyphosis. Neuropsychiatric: Alert and oriented 3, calm.  ECG: I personally reviewed the tracing from 05/10/2016 which showed normal sinus rhythm.  Recent Labwork: 11/10/2015: ALT 18; AST 21; TSH 3.530 05/05/2016: BUN 10; Creatinine, Ser 0.90; Hemoglobin 13.9; Platelets 214; Potassium 3.5; Sodium 140     Component Value Date/Time   CHOL 186 05/21/2013 1155   TRIG 115 05/21/2013 1155   HDL 40 05/21/2013 1155   CHOLHDL 4.7 05/21/2013 1155   VLDL 23 05/21/2013 1155   LDLCALC 123 (H) 05/21/2013 1155    Other Studies  Reviewed Today:  Cardiac catheterization 05/10/2016:  Widely patent LAD stent. Ostial 75% second diagonal jailed by LAD stent.  Totally occluded right coronary within the previously placed stent. The right fills by left to right collaterals.  Progression of first obtuse marginal from 30% to 75-80%. The vessel caliber is in the 1.5-2 mm range and is probably best treated with medical therapy.  Widely patent left main  Normal left ventricular function.EF estimated to be 60%. EDP is 14.  RECOMMENDATIONS:  Suspect angina is coming either from the first obtuse marginal, the second diagonal which is jailed, or from the collateralized right coronary.  Intensify medical therapy. ? Add Ranexa.  Intensify risk factor modification including LDL cholesterol less than 70.  Consider CTO PCI of the RCA if symptoms become refractory.  Consider PCI on the small to moderate size first obtuse marginal, however this vessel would possibly be oversized  by our smallest stent.  Assessment and Plan:   Current medicines were reviewed with the patient today.  No orders of the defined types were placed in this encounter.   Disposition:  Signed, Satira Sark, MD, [facility] 05/25/2016 8:37 AM    [facility] at [place]. [address], [place], [place] [address] Phone: [phone]; Fax: [phone]

## 2016-05-26 ENCOUNTER — Encounter: Payer: Medicare Other | Admitting: Cardiology

## 2016-05-26 NOTE — Progress Notes (Signed)
Cardiology Office Note  Date: 05/28/2016   ID: Bethany Wang, DOB 10-20-1965, MRN [id number]  PCP: [name], MD  Primary Cardiologist: [name], MD   Chief Complaint  Patient presents with  . Coronary Artery Disease    History of Present Illness: Bethany Wang is a 51 y.o. female that I saw her recently in January and referred for a follow-up diagnostic cardiac catheterization in light of reported accelerating angina symptoms. Procedure was performed by Dr. Tamala Julian with findings of patent LAD stent site, jailed diagonal branch, occluded RCA at prior stent placement with left to right collaterals, and progressive disease within the first obtuse marginal that was relatively small and best managed medically.  She is here today with her daughter for a follow-up visit. Had not taken her medicines yet this morning, elevated blood pressure reflects this. We had a long discussion about her recent cardiac catheterization findings, importance of regular medical therapy. I reviewed her current medications which are outlined below. We discussed adding Ranexa.  Past Medical History:  Diagnosis Date  . Anemia   . Bilateral breast cysts 12/30/2015  . Breast cancer, right breast ([place])   . BV (bacterial vaginosis) 09/09/2015  . Coronary atherosclerosis of native coronary artery    DES to RCA 03/2012, DES to LAD at [facility] 05/2012  . Cyst of pharynx or nasopharynx    [name]  . Essential hypertension   . Falls    x 3-4 in past year  . GERD (gastroesophageal reflux disease)   . History of breast cancer 09/09/2015  . History of hematuria   . Mixed hyperlipidemia   . PUD (peptic ulcer disease)   . Seizures ([place])   . Shingles 09/09/2015  . Suicide attempt    3 attempts in remote past  . Type 2 diabetes mellitus ([place])   . Uterine cancer [facility])     Past Surgical History:  Procedure Laterality Date  . ABDOMINAL HYSTERECTOMY    . BLADDER SURGERY    . CARDIAC  CATHETERIZATION N/A 05/10/2016   Procedure: Left Heart Cath and Coronary Angiography;  Surgeon: Belva Crome, MD;  Location: [facility];  Service: Cardiovascular;  Laterality: N/A;  . COLONOSCOPY  May 2010   Fle[place]man: normal rectum, internal hemorrhoids, , benign colonic polyp  . ESOPHAGOGASTRODUODENOSCOPY     2001 Dr. Amedeo Plenty: distal esophagitis, small hiatal hernia,. Dr. Tamala Julian 2006? no records available currently, pt also reports  EGD a few years ago with Dr. Gala Romney, do not have these reports anywhere in medical records  . ESOPHAGOGASTRODUODENOSCOPY  06/02/2011   [id number] pill impaction as described above s/p dilation of a probable cervical esophageal web/bx abnormal esophageal and gastric mucosa. + H.pylori gastritis   . EYE SURGERY     Removed glass  . HAND SURGERY Right   . Left breast lumpectomy     Benign  . LEFT HEART CATHETERIZATION WITH CORONARY ANGIOGRAM N/A 07/03/2014   Procedure: LEFT HEART CATHETERIZATION WITH CORONARY ANGIOGRAM;  Surgeon: Belva Crome, MD;  Location: [facility] CATH LAB;  Service: Cardiovascular;  Laterality: N/A;  . SHOULDER SURGERY Left     Current Outpatient Prescriptions  Medication Sig Dispense Refill  . aspirin EC 81 MG tablet Take 81 mg by mouth daily.    [name] EPINEPHrine 0.3 mg/0.3 mL IJ SOAJ injection Inject 0.3 mg into the muscle as needed (allergic reaction).     [name] escitalopram (LEXAPRO) 20 MG tablet Take 20 mg by mouth daily.     [name]  ibuprofen (ADVIL,MOTRIN) 200 MG tablet Take 400 mg by mouth at bedtime as needed for moderate pain.    . isosorbide mononitrate (IMDUR) 60 MG 24 hr tablet Take 1 tablet (60 mg total) by mouth 2 (two) times daily. 60 tablet 6  . lisinopril (PRINIVIL,ZESTRIL) 5 MG tablet Take 5 mg by mouth daily.     . metoprolol tartrate (LOPRESSOR) 25 MG tablet Take 25 mg by mouth 2 (two) times daily.     . naproxen sodium (ANAPROX) 220 MG tablet Take 440 mg by mouth at bedtime as needed (pain).    . nitroGLYCERIN (NITROSTAT) 0.4 MG SL  tablet Place 1 tablet (0.4 mg total) under the tongue every 5 (five) minutes x 3 doses as needed for chest pain. If no relief after 3rd dose, proceed to the ED for an evaluation 25 tablet 3  . ranolazine (RANEXA) 500 MG 12 hr tablet Take 1 tablet (500 mg total) by mouth 2 (two) times daily. 60 tablet 3   No current facility-administered medications for this visit.    Allergies:  Penicillins   Social History: The patient  reports that she quit smoking about 4 years ago. Her smoking use included Cigarettes. She started smoking about 32 years ago. She has a 6.40 pack-year smoking history. She has never used smokeless tobacco. She reports that she does not drink alcohol or use drugs.   ROS:  Please see the history of present illness. Otherwise, complete review of systems is positive for anxiety.  All other systems are reviewed and negative.   Physical Exam: VS:  BP (!) 150/82 Comment: has not taken medication  Pulse 70   Ht 5\' 4"  (1.626 m)   Wt 157 lb (71.2 kg)   SpO2 98%   BMI 26.95 kg/m , BMI Body mass index is 26.95 kg/m.  Wt Readings from Last 3 Encounters:  05/28/16 157 lb (71.2 kg)  05/10/16 155 lb (70.3 kg)  05/05/16 155 lb (70.3 kg)    Appears comfortable. No active chest pain. HEENT: Conjunctiva and lids normal, oropharynx clear.  Neck: Supple, no elevated JVP or carotid bruits, no thyromegaly.  Lungs: Clear to auscultation, nonlabored breathing at rest.  Cardiac: Regular rate and rhythm, no S3 or significant systolic murmur, no pericardial rub.  Abdomen: Soft, nontender, bowel sounds present.  Extremities: No pitting edema, distal pulses 2+. Skin: Warm and dry. Musculoskeletal: No kyphosis. Neuropsychiatric: Alert and oriented 3, calm.  ECG: I personally reviewed the tracing from 05/10/2016 which showed normal sinus rhythm.  Recent Labwork: 11/10/2015: ALT 18; AST 21; TSH 3.530 05/05/2016: BUN 10; Creatinine, Ser 0.90; Hemoglobin 13.9; Platelets 214; Potassium 3.5;  Sodium 140   Other Studies Reviewed Today:  Cardiac catheterization 05/10/2016:  Widely patent LAD stent. Ostial 75% second diagonal jailed by LAD stent.  Totally occluded right coronary within the previously placed stent. The right fills by left to right collaterals.  Progression of first obtuse marginal from 30% to 75-80%. The vessel caliber is in the 1.5-2 mm range and is probably best treated with medical therapy.  Widely patent left main  Normal left ventricular function.EF estimated to be 60%. EDP is 14.  RECOMMENDATIONS:  Suspect angina is coming either from the first obtuse marginal, the second diagonal which is jailed, or from the collateralized right coronary.  Intensify medical therapy. ? Add Ranexa.  Intensify risk factor modification including LDL cholesterol less than 70.  Consider CTO PCI of the RCA if symptoms become refractory.  Consider PCI on the  small to moderate size first obtuse marginal, however this vessel would possibly be oversized by our smallest stent.  Assessment and Plan:  1. Multivessel CAD as outlined above. Patient has recurring angina symptoms, recent angiography reviewed with her and plan for medical therapy. We will continue current regimen with the addition of Ranexa 500 mg twice daily. Encouraged a basic walking regimen for exercise.  2. Essential hypertension, blood pressure elevated today, she had not yet taken her morning medications.  3. Mixed hyperlipidemia, previously on Lipitor. Her medications were modified during the change in PCP. I will rediscuss statin therapy with her as well.  4. Tobacco abuse in remission.  Current medicines were reviewed with the patient today.  Disposition: Follow-up in 3 months.  Signed, Satira Sark, MD, [facility] 05/28/2016 8:30 AM    [place] at [facility] [address], [place], [place] [address] Phone: [phone]; Fax: [phone]

## 2016-05-28 ENCOUNTER — Encounter: Payer: Self-pay | Admitting: Cardiology

## 2016-05-28 ENCOUNTER — Ambulatory Visit (INDEPENDENT_AMBULATORY_CARE_PROVIDER_SITE_OTHER): Payer: Medicare Other | Admitting: Cardiology

## 2016-05-28 VITALS — BP 150/82 | HR 70 | Ht 64.0 in | Wt 157.0 lb

## 2016-05-28 DIAGNOSIS — I10 Essential (primary) hypertension: Secondary | ICD-10-CM

## 2016-05-28 DIAGNOSIS — I25119 Atherosclerotic heart disease of native coronary artery with unspecified angina pectoris: Secondary | ICD-10-CM | POA: Diagnosis not present

## 2016-05-28 DIAGNOSIS — E782 Mixed hyperlipidemia: Secondary | ICD-10-CM | POA: Diagnosis not present

## 2016-05-28 DIAGNOSIS — F17201 Nicotine dependence, unspecified, in remission: Secondary | ICD-10-CM | POA: Diagnosis not present

## 2016-05-28 MED ORDER — RANOLAZINE ER 500 MG PO TB12: 500.0000 mg | ORAL_TABLET | Freq: Two times a day (BID) | ORAL | 3 refills | Status: DC

## 2016-05-28 NOTE — Patient Instructions (Addendum)
Medication Instructions:    Your physician has recommended you make the following change in your medication:  1) START Ranexa 500 mg twice a day  --- If you need a refill on your cardiac medications before your next appointment, please call your pharmacy. ---  Labwork:  None ordered  Testing/Procedures:  None ordered  Follow-Up:  Your physician recommends that you schedule a follow-up appointment in: 3 months with Dr. Domenic Polite  Thank you for choosing [facility]!!    Any Other Special Instructions Will Be Listed Below (If Applicable).  Ranolazine tablets, extended release What is this medicine? RANOLAZINE (ra NOE la zeen) is a heart medicine. It is used to treat chronic chest pain (angina). This medicine must be taken regularly. It will not relieve an acute episode of chest pain. COMMON BRAND NAME(S): Ranexa What should I tell my health care provider before I take this medicine? They need to know if you have any of these conditions: -heart disease -irregular heartbeat -kidney disease -liver disease -low levels of potassium or magnesium in the blood -an unusual or allergic reaction to ranolazine, other medicines, foods, dyes, or preservatives -pregnant or trying to get pregnant -breast-feeding How should I use this medicine? Take this medicine by mouth with a glass of water. Follow the directions on the prescription label. Do not cut, crush, or chew this medicine. Take with or without food. Do not take this medication with grapefruit juice. Take your doses at regular intervals. Do not take your medicine more often then directed. Talk to your pediatrician regarding the use of this medicine in children. Special care may be needed. What if I miss a dose? If you miss a dose, take it as soon as you can. If it is almost time for your next dose, take only that dose. Do not take double or extra doses. What may interact with this medicine? Do not take this medicine with any of  the following medications: -antivirals for HIV or AIDS -cerivastatin -certain antibiotics like chloramphenicol, clarithromycin, dalfopristin; quinupristin, isoniazid, rifabutin, rifampin, rifapentine -certain medicines used for cancer like imatinib, nilotinib -certain medicines for fungal infections like fluconazole, itraconazole, ketoconazole, posaconazole, voriconazole -certain medicines for irregular heart beat like dofetilide, dronedarone -certain medicines for seizures like carbamazepine, fosphenytoin, oxcarbazepine, phenobarbital, phenytoin -cisapride -conivaptan -cyclosporine -grapefruit or grapefruit juice -lumacaftor; ivacaftor -nefazodone -pimozide -quinacrine -St John's wort -thioridazine -ziprasidone This medicine may also interact with the following medications: -alfuzosin -certain medicines for depression, anxiety, or psychotic disturbances like bupropion, citalopram, fluoxetine, fluphenazine, paroxetine, perphenazine, risperidone, sertraline, trifluoperazine -certain medicines for cholesterol like atorvastatin, lovastatin, simvastatin -certain medicines for stomach problems like octreotide, palonosetron, prochlorperazine -eplerenone -ergot alkaloids like dihydroergotamine, ergonovine, ergotamine, methylergonovine -metformin -nicardipine -other medicines that prolong the QT interval (cause an abnormal heart rhythm) -sirolimus -tacrolimus What should I watch for while using this medicine? Visit your doctor for regular check ups. Tell your doctor or healthcare professional if your symptoms do not start to get better or if they get worse. This medicine will not relieve an acute attack of angina or chest pain. This medicine can change your heart rhythm. Your health care provider may check your heart rhythm by ordering an electrocardiogram (ECG) while you are taking this medicine. You may get drowsy or dizzy. Do not drive, use machinery, or do anything that needs mental  alertness until you know how this medicine affects you. Do not stand or sit up quickly, especially if you are an older patient. This reduces the risk of dizzy or fainting  spells. Alcohol may interfere with the effect of this medicine. Avoid alcoholic drinks. If you are scheduled for any medical or dental procedure, tell your healthcare provider that you are taking this medicine. This medicine can interact with other medicines used during surgery. What side effects may I notice from receiving this medicine? Side effects that you should report to your doctor or health care professional as soon as possible: -allergic reactions like skin rash, itching or hives, swelling of the face, lips, or tongue -breathing problems -changes in vision -fast, irregular or pounding heartbeat -feeling faint or lightheaded, falls -low or high blood pressure -numbness or tingling feelings -ringing in the ears -tremor or shakiness -slow heartbeat (fewer than 50 beats per minute) -swelling of the legs or feet Side effects that usually do not require medical attention (report to your doctor or health care professional if they continue or are bothersome): -constipation -drowsy -dry mouth -headache -nausea or vomiting -stomach upset Where should I keep my medicine? Keep out of the reach of children. Store at room temperature between 15 and 30 degrees C (59 and 86 degrees F). Throw away any unused medicine after the expiration date.  2017 Elsevier/Gold Standard (2015-05-01 12:24:15)

## 2016-06-01 ENCOUNTER — Telehealth: Payer: Self-pay | Admitting: Adult Health

## 2016-06-01 MED ORDER — FLUCONAZOLE 150 MG PO TABS: ORAL_TABLET | ORAL | 0 refills | Status: DC

## 2016-06-01 NOTE — Telephone Encounter (Signed)
Has yeast, has been on antibiotics, will rx diflucan

## 2016-06-03 ENCOUNTER — Ambulatory Visit: Payer: Medicare Other | Admitting: Cardiology

## 2016-06-14 ENCOUNTER — Observation Stay (HOSPITAL_COMMUNITY)
Admission: EM | Admit: 2016-06-14 | Discharge: 2016-06-16 | Disposition: A | Discharge status: Home / Self Care | Payer: Medicare Other | Attending: Family Medicine | Admitting: Family Medicine

## 2016-06-14 ENCOUNTER — Emergency Department (HOSPITAL_COMMUNITY): Payer: Medicare Other

## 2016-06-14 ENCOUNTER — Encounter (HOSPITAL_COMMUNITY): Payer: Self-pay | Admitting: *Deleted

## 2016-06-14 DIAGNOSIS — F411 Generalized anxiety disorder: Secondary | ICD-10-CM | POA: Diagnosis not present

## 2016-06-14 DIAGNOSIS — G40909 Epilepsy, unspecified, not intractable, without status epilepticus: Secondary | ICD-10-CM | POA: Diagnosis not present

## 2016-06-14 DIAGNOSIS — Z853 Personal history of malignant neoplasm of breast: Secondary | ICD-10-CM | POA: Insufficient documentation

## 2016-06-14 DIAGNOSIS — Z8711 Personal history of peptic ulcer disease: Secondary | ICD-10-CM | POA: Diagnosis not present

## 2016-06-14 DIAGNOSIS — F410 Panic disorder [episodic paroxysmal anxiety] without agoraphobia: Secondary | ICD-10-CM | POA: Diagnosis not present

## 2016-06-14 DIAGNOSIS — G43101 Migraine with aura, not intractable, with status migrainosus: Secondary | ICD-10-CM | POA: Insufficient documentation

## 2016-06-14 DIAGNOSIS — K625 Hemorrhage of anus and rectum: Secondary | ICD-10-CM | POA: Insufficient documentation

## 2016-06-14 DIAGNOSIS — G8929 Other chronic pain: Secondary | ICD-10-CM | POA: Diagnosis not present

## 2016-06-14 DIAGNOSIS — Z8542 Personal history of malignant neoplasm of other parts of uterus: Secondary | ICD-10-CM | POA: Insufficient documentation

## 2016-06-14 DIAGNOSIS — I25110 Atherosclerotic heart disease of native coronary artery with unstable angina pectoris: Principal | ICD-10-CM | POA: Insufficient documentation

## 2016-06-14 DIAGNOSIS — I160 Hypertensive urgency: Secondary | ICD-10-CM | POA: Diagnosis not present

## 2016-06-14 DIAGNOSIS — K449 Diaphragmatic hernia without obstruction or gangrene: Secondary | ICD-10-CM | POA: Diagnosis not present

## 2016-06-14 DIAGNOSIS — E782 Mixed hyperlipidemia: Secondary | ICD-10-CM | POA: Diagnosis not present

## 2016-06-14 DIAGNOSIS — M5412 Radiculopathy, cervical region: Secondary | ICD-10-CM | POA: Diagnosis not present

## 2016-06-14 DIAGNOSIS — Z8249 Family history of ischemic heart disease and other diseases of the circulatory system: Secondary | ICD-10-CM | POA: Insufficient documentation

## 2016-06-14 DIAGNOSIS — Z90710 Acquired absence of both cervix and uterus: Secondary | ICD-10-CM | POA: Insufficient documentation

## 2016-06-14 DIAGNOSIS — Z87891 Personal history of nicotine dependence: Secondary | ICD-10-CM | POA: Insufficient documentation

## 2016-06-14 DIAGNOSIS — G450 Vertebro-basilar artery syndrome: Secondary | ICD-10-CM | POA: Insufficient documentation

## 2016-06-14 DIAGNOSIS — K648 Other hemorrhoids: Secondary | ICD-10-CM | POA: Diagnosis not present

## 2016-06-14 DIAGNOSIS — G4700 Insomnia, unspecified: Secondary | ICD-10-CM | POA: Diagnosis not present

## 2016-06-14 DIAGNOSIS — J392 Other diseases of pharynx: Secondary | ICD-10-CM | POA: Diagnosis not present

## 2016-06-14 DIAGNOSIS — Z7982 Long term (current) use of aspirin: Secondary | ICD-10-CM | POA: Insufficient documentation

## 2016-06-14 DIAGNOSIS — I161 Hypertensive emergency: Secondary | ICD-10-CM

## 2016-06-14 DIAGNOSIS — D509 Iron deficiency anemia, unspecified: Secondary | ICD-10-CM | POA: Diagnosis not present

## 2016-06-14 DIAGNOSIS — I200 Unstable angina: Secondary | ICD-10-CM | POA: Diagnosis present

## 2016-06-14 DIAGNOSIS — Z955 Presence of coronary angioplasty implant and graft: Secondary | ICD-10-CM | POA: Insufficient documentation

## 2016-06-14 DIAGNOSIS — M545 Low back pain: Secondary | ICD-10-CM | POA: Diagnosis not present

## 2016-06-14 DIAGNOSIS — Z880 Allergy status to penicillin: Secondary | ICD-10-CM | POA: Insufficient documentation

## 2016-06-14 DIAGNOSIS — Z800 Family history of malignant neoplasm of digestive organs: Secondary | ICD-10-CM | POA: Insufficient documentation

## 2016-06-14 DIAGNOSIS — Z79899 Other long term (current) drug therapy: Secondary | ICD-10-CM | POA: Insufficient documentation

## 2016-06-14 DIAGNOSIS — I209 Angina pectoris, unspecified: Secondary | ICD-10-CM

## 2016-06-14 DIAGNOSIS — Z8379 Family history of other diseases of the digestive system: Secondary | ICD-10-CM | POA: Insufficient documentation

## 2016-06-14 DIAGNOSIS — Z820 Family history of epilepsy and other diseases of the nervous system: Secondary | ICD-10-CM | POA: Insufficient documentation

## 2016-06-14 DIAGNOSIS — E119 Type 2 diabetes mellitus without complications: Secondary | ICD-10-CM | POA: Insufficient documentation

## 2016-06-14 DIAGNOSIS — R079 Chest pain, unspecified: Secondary | ICD-10-CM

## 2016-06-14 DIAGNOSIS — E785 Hyperlipidemia, unspecified: Secondary | ICD-10-CM | POA: Diagnosis present

## 2016-06-14 DIAGNOSIS — Z818 Family history of other mental and behavioral disorders: Secondary | ICD-10-CM | POA: Insufficient documentation

## 2016-06-14 DIAGNOSIS — K219 Gastro-esophageal reflux disease without esophagitis: Secondary | ICD-10-CM | POA: Diagnosis not present

## 2016-06-14 DIAGNOSIS — Z809 Family history of malignant neoplasm, unspecified: Secondary | ICD-10-CM | POA: Insufficient documentation

## 2016-06-14 DIAGNOSIS — Z801 Family history of malignant neoplasm of trachea, bronchus and lung: Secondary | ICD-10-CM | POA: Insufficient documentation

## 2016-06-14 MED ORDER — NICARDIPINE HCL IN NACL 20-0.86 MG/200ML-% IV SOLN: 3.0000 mg/h | Freq: Once | INTRAVENOUS | Status: DC

## 2016-06-14 MED ORDER — NITROGLYCERIN IN D5W 200-5 MCG/ML-% IV SOLN
5.0000 ug/min | Freq: Once | INTRAVENOUS | Status: AC
  Administered 2016-06-15: 5 ug/min via INTRAVENOUS
  Filled 2016-06-14: qty 250

## 2016-06-14 MED ORDER — HEPARIN (PORCINE) IN NACL 100-0.45 UNIT/ML-% IJ SOLN
12.0000 [IU]/kg/h | Freq: Once | INTRAMUSCULAR | Status: AC
  Administered 2016-06-14: 12 [IU]/kg/h via INTRAVENOUS
  Filled 2016-06-14: qty 250

## 2016-06-14 MED ORDER — HEPARIN SODIUM (PORCINE) 5000 UNIT/ML IJ SOLN
60.0000 [IU]/kg | Freq: Once | INTRAMUSCULAR | Status: AC
  Administered 2016-06-14: 3800 [IU] via INTRAVENOUS

## 2016-06-14 NOTE — ED Triage Notes (Signed)
Pt c/o left sided chest pain that started x 2 days ago; pt had stents placed at the end of February; pt states she took a nitroglycerin and 324mg  of asa pta of ems; ems administered 1 nitro tablet with decrease in BP from 210's/140's to 152/92

## 2016-06-14 NOTE — ED Provider Notes (Signed)
[facility] DEPT Provider Note   CSN: [id number] Arrival date & time: 06/14/16  2247 By signing my name below, I, Dyke Brackett, attest that this documentation has been prepared under the direction and in the presence of Rolland Porter, MD . Electronically Signed: Dyke Brackett, Scribe. 06/14/2016. 11:39 PM.   Time seen 23:22 PM  History   Chief Complaint Chief Complaint  Patient presents with  . Chest Pain   HPI Bethany Wang is a 51 y.o. female with a history of CAD s/p cardiac stents, HTN, HLD, seizure disorder, DM, and breast cancer who presents to the Emergency Department complaining of constant, 9/10 central chest pain onset three days ago. Per pt, this pain began while at rest after arguing with her daughter. She describes this pain as gradually worsening,  pressure-like, throbbing pain with no exacerbating factors except arguing with her daughter. Pt has taken nitroglycerin and ASA with minimal relief. She was administered 1 tablet of NGL by EMS en route to the ED tonight with some relief of pain and improvement in blood pressure. Per pt, this feels similar to the pain she experienced with her prior heart problems. Pt reports that she has had to have one of her stents unclogged 4 times. Per pt, she has been having difficulties at home with her alcoholic daughter and she had another incident with daughter tonight PTA. Pt denies any regular tobacco use, but states she smokes a Black and Mild cigar after arguing with her daughter.Pt states she has been compliant with her medication. She is currently on disability. She notes associated nausea and vomiting with SOB tonight which is why she called 011, and denies any other associated symptoms. Pt has no other complaints at this time. She took NTG x 3 at noon today. 911 advised her to chew 4 aspirin which she did tonight PTA.   Reports she has 2 stents, they were placed in 2013 in 2014. She states last month she had a cardiac cath and they started  her on Ranexa. They told her the next step would be bypass surgery.  The history is provided by the patient. No language interpreter was used.   PCP [name], MD Cardiology Dr Domenic Polite  Past Medical History:  Diagnosis Date  . Anemia   . Bilateral breast cysts 12/30/2015  . Breast cancer, right breast ([place])   . BV (bacterial vaginosis) 09/09/2015  . Coronary atherosclerosis of native coronary artery    DES to RCA 03/2012, DES to LAD at [facility] 05/2012  . Cyst of pharynx or nasopharynx    [name]  . Essential hypertension   . Falls    x 3-4 in past year  . GERD (gastroesophageal reflux disease)   . History of breast cancer 09/09/2015  . History of hematuria   . Mixed hyperlipidemia   . PUD (peptic ulcer disease)   . Seizures ([place])   . Shingles 09/09/2015  . Suicide attempt    3 attempts in remote past  . Type 2 diabetes mellitus ([place])   . Uterine cancer [facility])     Patient Active Problem List   Diagnosis Date Noted  . Bilateral breast cysts 12/30/2015  . Vaginal discharge 09/09/2015  . BV (bacterial vaginosis) 09/09/2015  . Shingles 09/09/2015  . History of breast cancer 09/09/2015  . Syncope and collapse 08/08/2015  . Migraine with aura and with status migrainosus, not intractable 08/08/2015  . Chronic low back pain 08/08/2015  . Insomnia 08/08/2015  . Anxiety state  08/08/2015  . Panic attacks 08/08/2015  . Anemia, iron deficiency 08/08/2015  . Pica 08/08/2015  . Angina decubitus ([place]) 07/03/2014  . 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 Visit for annual health examination 02/04/2014  . Syncope 12/14/2013  . Cervical disc disorder with radiculopathy of cervical region 02/19/2013  . H/O rotator cuff surgery 02/19/2013  . Dyspareunia 02/01/2013  . Left shoulder pain 01/24/2013  . Rotator cuff tear 01/24/2013  . Radicular pain 01/24/2013  . Labral tear of shoulder 01/24/2013  . Complex regional pain syndrome of upper extremity  01/24/2013  . Herniated disc, cervical 01/24/2013  . Abdominal pain, other specified site 01/18/2013  . Nausea and vomiting 01/18/2013  . Rectal bleeding 01/18/2013  . Edema of left foot 10/16/2012  . Coronary atherosclerosis of native coronary artery   . Essential hypertension, benign   . HLD (hyperlipidemia)   . Tobacco abuse     Past Surgical History:  Procedure Laterality Date  . ABDOMINAL HYSTERECTOMY    . BLADDER SURGERY    . CARDIAC CATHETERIZATION N/A 05/10/2016   Procedure: Left Heart Cath and Coronary Angiography;  Surgeon: Belva Crome, MD;  Location: [facility] CV LAB;  Service: Cardiovascular;  Laterality: N/A;  . COLONOSCOPY  May 2010   Fle[place]man: normal rectum, internal hemorrhoids, , benign colonic polyp  . ESOPHAGOGASTRODUODENOSCOPY     2001 Dr. Amedeo Plenty: distal esophagitis, small hiatal hernia,. Dr. Tamala Julian 2006? no records available currently, pt also reports  EGD a few years ago with Dr. Gala Romney, do not have these reports anywhere in medical records  . ESOPHAGOGASTRODUODENOSCOPY  06/02/2011   [id number] pill impaction as described above s/p dilation of a probable cervical esophageal web/bx abnormal esophageal and gastric mucosa. + H.pylori gastritis   . EYE SURGERY     Removed glass  . HAND SURGERY Right   . Left breast lumpectomy     Benign  . LEFT HEART CATHETERIZATION WITH CORONARY ANGIOGRAM N/A 07/03/2014   Procedure: LEFT HEART CATHETERIZATION WITH CORONARY ANGIOGRAM;  Surgeon: Belva Crome, MD;  Location: [facility] CATH LAB;  Service: Cardiovascular;  Laterality: N/A;  . SHOULDER SURGERY Left     OB History    Gravida Para Term Preterm AB Living   4 3     1 3    SAB TAB Ectopic Multiple Live Births   1               Home Medications    Prior to Admission medications   Medication Sig Start Date End Date Taking? Authorizing Provider  aspirin EC 81 MG tablet Take 81 mg by mouth daily.    Historical Provider, MD  EPINEPHrine 0.3 mg/0.3 mL IJ SOAJ injection  Inject 0.3 mg into the muscle as needed (allergic reaction).     Historical Provider, MD  escitalopram (LEXAPRO) 20 MG tablet Take 20 mg by mouth daily.  11/03/15   Historical Provider, MD  fluconazole (DIFLUCAN) 150 MG tablet Take 1 now and 1 in 3 days 06/01/16   Estill Dooms, NP  ibuprofen (ADVIL,MOTRIN) 200 MG tablet Take 400 mg by mouth at bedtime as needed for moderate pain.    Historical Provider, MD  isosorbide mononitrate (IMDUR) 60 MG 24 hr tablet Take 1 tablet (60 mg total) by mouth 2 (two) times daily. 06/16/15   Satira Sark, MD  lisinopril (PRINIVIL,ZESTRIL) 5 MG tablet Take 5 mg by mouth daily.  04/19/16   Historical Provider, MD  metoprolol tartrate (LOPRESSOR) 25 MG tablet Take 25  mg by mouth 2 (two) times daily.  03/18/16   Historical Provider, MD  naproxen sodium (ANAPROX) 220 MG tablet Take 440 mg by mouth at bedtime as needed (pain).    Historical Provider, MD  nitroGLYCERIN (NITROSTAT) 0.4 MG SL tablet Place 1 tablet (0.4 mg total) under the tongue every 5 (five) minutes x 3 doses as needed for chest pain. If no relief after 3rd dose, proceed to the ED for an evaluation 12/17/14   Satira Sark, MD  ranolazine (RANEXA) 500 MG 12 hr tablet Take 1 tablet (500 mg total) by mouth 2 (two) times daily. 05/28/16   Satira Sark, MD    Family History Family History  Problem Relation Age of Onset  . Colon cancer Paternal Grandfather   . Cancer Father   . Cancer Maternal Uncle   . Alzheimer's disease Paternal Aunt   . Cirrhosis Maternal Uncle   . Cancer Paternal Aunt     lung  . Heart disease Brother   . Heart attack Brother   . Mental illness Daughter   . ADD / ADHD Daughter   . Bipolar disorder Daughter   . Mental illness Son   . ADD / ADHD Son   . Bipolar disorder Son   . Mental illness Son   . ADD / ADHD Son   . Bipolar disorder Son     Social History Social History  Substance Use Topics  . Smoking status: Former Smoker    Packs/day: 0.20    Years:  32.00    Types: Cigarettes    Start date: 07/05/1983    Quit date: 04/03/2012  . Smokeless tobacco: Never Used  . Alcohol use No  on disability Smokes black and milds when stressed Has her alcoholic daughter and her 3 children living with her   Allergies   Penicillins  Review of Systems Review of Systems  All other systems reviewed and are negative.  10 systems reviewed and all are negative for acute change except as noted in the HPI.  Physical Exam Updated Vital Signs BP 181/89   Pulse 60   Temp 97.7 F (36.5 C) (Oral)   Resp 14   Ht 5\' 4"  (1.626 m)   Wt 140 lb (63.5 kg)   SpO2 100%   BMI 24.03 kg/m   Vital signs normal except for hypertension, bradycardia   Physical Exam  Constitutional: She is oriented to person, place, and time. She appears well-developed and well-nourished.  Non-toxic appearance. She does not appear ill. No distress.  HENT:  Head: Normocephalic and atraumatic.  Right Ear: External ear normal.  Left Ear: External ear normal.  Nose: Nose normal. No mucosal edema or rhinorrhea.  Mouth/Throat: Oropharynx is clear and moist and mucous membranes are normal. No dental abscesses or uvula swelling.  Eyes: Conjunctivae and EOM are normal. Pupils are equal, round, and reactive to light.  Neck: Normal range of motion and full passive range of motion without pain. Neck supple.  Cardiovascular: Normal rate, regular rhythm and normal heart sounds.  Exam reveals no gallop and no friction rub.   No murmur heard. Pulmonary/Chest: Effort normal and breath sounds normal. No respiratory distress. She has no wheezes. She has no rhonchi. She has no rales. She exhibits no tenderness and no crepitus.  Abdominal: Soft. Normal appearance and bowel sounds are normal. She exhibits no distension. There is no tenderness. There is no rebound and no guarding.  Musculoskeletal: Normal range of motion. She exhibits no edema or  tenderness.  Neurological: She is alert and oriented  to person, place, and time. She has normal strength. No cranial nerve deficit.  Skin: Skin is warm, dry and intact. No rash noted. No erythema. No pallor.  Psychiatric: She has a normal mood and affect. Her speech is normal and behavior is normal. Her mood appears not anxious.  Nursing note and vitals reviewed.  ED Treatments / Results  DIAGNOSTIC STUDIES:  Oxygen Saturation is 100% on RA, normal by my interpretation.      Labs (all labs ordered are listed, but only abnormal results are displayed) Results for orders placed or performed during the hospital encounter of 06/14/16  CBC with Differential  Result Value Ref Range   WBC 6.2 4.0 - 10.5 K/uL   RBC 4.54 3.87 - 5.11 MIL/uL   Hemoglobin 12.9 12.0 - 15.0 g/dL   HCT 37.9 36.0 - 46.0 %   MCV 83.5 78.0 - 100.0 fL   MCH 28.4 26.0 - 34.0 pg   MCHC 34.0 30.0 - 36.0 g/dL   RDW 14.5 11.5 - 15.5 %   Platelets 202 150 - 400 K/uL   Neutrophils Relative % 29 %   Neutro Abs 1.8 1.7 - 7.7 K/uL   Lymphocytes Relative 62 %   Lymphs Abs 3.8 0.7 - 4.0 K/uL   Monocytes Relative 7 %   Monocytes Absolute 0.5 0.1 - 1.0 K/uL   Eosinophils Relative 2 %   Eosinophils Absolute 0.1 0.0 - 0.7 K/uL   Basophils Relative 0 %   Basophils Absolute 0.0 0.0 - 0.1 K/uL  Troponin I  Result Value Ref Range   Troponin I <0.03 <0.03 ng/mL  Basic metabolic panel  Result Value Ref Range   Sodium 138 135 - 145 mmol/L   Potassium 3.2 (L) 3.5 - 5.1 mmol/L   Chloride 106 101 - 111 mmol/L   CO2 26 22 - 32 mmol/L   Glucose, Bld 107 (H) 65 - 99 mg/dL   BUN 12 6 - 20 mg/dL   Creatinine, Ser 0.71 0.44 - 1.00 mg/dL   Calcium 9.3 8.9 - 10.3 mg/dL   GFR calc non Af Amer >60 >60 mL/min   GFR calc Af Amer >60 >60 mL/min   Anion gap 6 5 - 15  Protime-INR  Result Value Ref Range   Prothrombin Time 12.2 11.4 - 15.2 seconds   INR 0.91   APTT  Result Value Ref Range   aPTT 31 24 - 36 seconds   Laboratory interpretation all normal except hypokalemia    EKG   EKG Interpretation  Date/Time:  Monday June 14 2016 22:56:47 EST Ventricular Rate:  57 PR Interval:    QRS Duration: 108 QT Interval:  444 QTC Calculation: 433 R Axis:   51 Text Interpretation:  Sinus rhythm Left ventricular hypertrophy Anterior Q waves, possibly due to LVH Baseline wander in lead(s) V2 Since last tracing 10 May 2016 T wave inversion now evident in Septal leads Confirmed by [place]  MD-I, Shanley Furlough ([id number]) on 06/14/2016 11:01:10 PM       Radiology Dg Chest Portable 1 View  Result Date: 06/14/2016 CLINICAL DATA:  Left-sided chest pain starting 2 days ago EXAM: PORTABLE CHEST 1 VIEW COMPARISON:  02/09/2016 CXR FINDINGS: The heart size and mediastinal contours are within normal limits. Both lungs are clear. The visualized skeletal structures are unremarkable. IMPRESSION: No active disease. Electronically Signed   By: Ashley Royalty M.D.   On: 06/14/2016 23:17     Marylouise Stacks  Cardiac catheterization  Order# [id number]  Reading physician: Belva Crome, MD Ordering physician: Belva Crome, MD Study date: 05/10/16  Physicians   Panel Physicians Referring Physician Case Authorizing Physician  Belva Crome, MD (Primary)    Procedures   Left Heart Cath and Coronary Angiography  Conclusion    Widely patent LAD stent. Ostial 75% second diagonal jailed by LAD stent.  Totally occluded right coronary within the previously placed stent. The right fills by left to right collaterals.  Progression of first obtuse marginal from 30% to 75-80%. The vessel caliber is in the 1.5-2 mm range and is probably best treated with medical therapy.  Widely patent left main  Normal left ventricular function.EF estimated to be 60%. EDP is 14.  RECOMMENDATIONS:  Suspect angina is coming either from the first obtuse marginal, the second diagonal which is jailed, or from the collateralized right coronary.  Intensify medical therapy. ? Add Ranexa.  Intensify risk factor modification including  LDL cholesterol less than 70.  Consider CTO PCI of the RCA if symptoms become refractory.  Consider PCI on the small to moderate size first obtuse marginal, however this vessel would possibly be oversized by our smallest stent.      Procedures Procedures (including critical care time)  CRITICAL CARE Performed by: Dellie Piasecki L Herlinda Heady Total critical care time: 38 minutes Critical care time was exclusive of separately billable procedures and treating other patients. Critical care was necessary to treat or prevent imminent or life-threatening deterioration. Critical care was time spent personally by me on the following activities: development of treatment plan with patient and/or surrogate as well as nursing, discussions with consultants, evaluation of patient's response to treatment, examination of patient, obtaining history from patient or surrogate, ordering and performing treatments and interventions, ordering and review of laboratory studies, ordering and review of radiographic studies, pulse oximetry and re-evaluation of patient's condition.   Medications Ordered in ED Medications  nitroGLYCERIN 50 mg in dextrose 5 % 250 mL (0.2 mg/mL) infusion (10 mcg/min Intravenous Rate/Dose Change 06/15/16 0058)  heparin injection 3,800 Units (3,800 Units Intravenous Given 06/14/16 2359)  heparin ADULT infusion 100 units/mL (25000 units/247mL sodium chloride 0.45%) (12 Units/kg/hr  63.5 kg Intravenous New Bag/Given 06/14/16 2358)     Initial Impression / Assessment and Plan / ED Course  I have reviewed the triage vital signs and the nursing notes.   Pertinent labs & imaging results that were available during my care of the patient were reviewed by me and considered in my medical decision making (see chart for details).  COORDINATION OF CARE:  11:36 PM Discussed treatment plan with pt at bedside and pt agreed to plan. Patient artery took aspirin just prior to arrival. She was started on a nitroglycerin  drip, heparin bolus and drip, and a Cardene drip for her hypertension. She could not receive a beta blocker because she already borderline bradycardic.  The Cardene drip is never started because her blood pressure improved with a nitroglycerin drip.   Patient was rechecked at 2 AM. Her blood pressure is 137/77, patient is sleeping. When awakened she states her pain is almost gone. I discussed talking to her cardiologist about what to do with her and she is agreeable.  2:43 AM Dr. Kenton Kingfisher, cardiology fellow at [facility] has reviewed her information tonight. He feels like she should be admitted to observe for hypertensive emergency with chest pain tonight. He feels like she can be seen by the cardiologist. He states they most likely  are going to increase her Imdur or in her Ranexa however he feels like the cardiologist should evaluate her for that. He states he feels like her angina is related to the stress in her life rather than being exertional which is usually typical for angina.  3:27 AM Dr. Marin Comment, hospitalist, will see patient for admission.     Final Clinical Impressions(s) / ED Diagnoses   Final diagnoses:  Hypertensive emergency  Chest pain, unspecified type  Angina pectoris ([place])    Plan admission  Rolland Porter, MD, FACEP  I personally performed the services described in this documentation, which was scribed in my presence. The recorded information has been reviewed and considered.  Rolland Porter, MD, Barbette Or, MD 06/15/16 [id number]

## 2016-06-15 ENCOUNTER — Encounter (HOSPITAL_COMMUNITY): Payer: Self-pay

## 2016-06-15 DIAGNOSIS — I200 Unstable angina: Secondary | ICD-10-CM | POA: Diagnosis not present

## 2016-06-15 DIAGNOSIS — R0789 Other chest pain: Secondary | ICD-10-CM | POA: Diagnosis not present

## 2016-06-15 DIAGNOSIS — F410 Panic disorder [episodic paroxysmal anxiety] without agoraphobia: Secondary | ICD-10-CM | POA: Diagnosis not present

## 2016-06-15 DIAGNOSIS — I2510 Atherosclerotic heart disease of native coronary artery without angina pectoris: Secondary | ICD-10-CM | POA: Diagnosis not present

## 2016-06-15 DIAGNOSIS — I160 Hypertensive urgency: Secondary | ICD-10-CM | POA: Diagnosis not present

## 2016-06-15 DIAGNOSIS — I25110 Atherosclerotic heart disease of native coronary artery with unstable angina pectoris: Secondary | ICD-10-CM | POA: Diagnosis not present

## 2016-06-15 LAB — CBC
HEMATOCRIT: 40.9 % (ref 36.0–46.0)
Hemoglobin: 13.7 g/dL (ref 12.0–15.0)
MCH: 28.1 pg (ref 26.0–34.0)
MCHC: 33.5 g/dL (ref 30.0–36.0)
MCV: 84 fL (ref 78.0–100.0)
PLATELETS: 223 10*3/uL (ref 150–400)
RBC: 4.87 MIL/uL (ref 3.87–5.11)
RDW: 14.6 % (ref 11.5–15.5)
WBC: 5.9 10*3/uL (ref 4.0–10.5)

## 2016-06-15 LAB — CBC WITH DIFFERENTIAL/PLATELET
BASOS ABS: 0 10*3/uL (ref 0.0–0.1)
BASOS PCT: 0 %
EOS ABS: 0.1 10*3/uL (ref 0.0–0.7)
EOS PCT: 2 %
HEMATOCRIT: 37.9 % (ref 36.0–46.0)
Hemoglobin: 12.9 g/dL (ref 12.0–15.0)
Lymphocytes Relative: 62 %
Lymphs Abs: 3.8 10*3/uL (ref 0.7–4.0)
MCH: 28.4 pg (ref 26.0–34.0)
MCHC: 34 g/dL (ref 30.0–36.0)
MCV: 83.5 fL (ref 78.0–100.0)
MONO ABS: 0.5 10*3/uL (ref 0.1–1.0)
MONOS PCT: 7 %
NEUTROS ABS: 1.8 10*3/uL (ref 1.7–7.7)
Neutrophils Relative %: 29 %
PLATELETS: 202 10*3/uL (ref 150–400)
RBC: 4.54 MIL/uL (ref 3.87–5.11)
RDW: 14.5 % (ref 11.5–15.5)
WBC: 6.2 10*3/uL (ref 4.0–10.5)

## 2016-06-15 LAB — PROTIME-INR
INR: 0.91
PROTHROMBIN TIME: 12.2 s (ref 11.4–15.2)

## 2016-06-15 LAB — BASIC METABOLIC PANEL
Anion gap: 6 (ref 5–15)
BUN: 12 mg/dL (ref 6–20)
CALCIUM: 9.3 mg/dL (ref 8.9–10.3)
CHLORIDE: 106 mmol/L (ref 101–111)
CO2: 26 mmol/L (ref 22–32)
CREATININE: 0.71 mg/dL (ref 0.44–1.00)
GFR calc non Af Amer: >60 mL/min (ref >60)
Glucose, Bld: 107 mg/dL — ABNORMAL HIGH (ref 65–99)
Potassium: 3.2 mmol/L — ABNORMAL LOW (ref 3.5–5.1)
SODIUM: 138 mmol/L (ref 135–145)

## 2016-06-15 LAB — MRSA PCR SCREENING: MRSA by PCR: NEGATIVE

## 2016-06-15 LAB — TROPONIN I: Troponin I: <0.03 ng/mL (ref <0.03)

## 2016-06-15 LAB — HEPARIN LEVEL (UNFRACTIONATED)
HEPARIN UNFRACTIONATED: 0.25 [IU]/mL — AB (ref 0.30–0.70)
Heparin Unfractionated: 0.17 IU/mL — ABNORMAL LOW (ref 0.30–0.70)

## 2016-06-15 LAB — APTT: aPTT: 31 seconds (ref 24–36)

## 2016-06-15 MED ORDER — ASPIRIN EC 81 MG PO TBEC
81.0000 mg | DELAYED_RELEASE_TABLET | Freq: Every day | ORAL | Status: DC
  Administered 2016-06-15 – 2016-06-16 (×2): 81 mg via ORAL
  Filled 2016-06-15 (×2): qty 1

## 2016-06-15 MED ORDER — MORPHINE SULFATE (PF) 2 MG/ML IV SOLN: 2.0000 mg | INTRAVENOUS | Status: DC | PRN

## 2016-06-15 MED ORDER — POTASSIUM CHLORIDE CRYS ER 20 MEQ PO TBCR
20.0000 meq | EXTENDED_RELEASE_TABLET | Freq: Every day | ORAL | Status: DC
  Administered 2016-06-15 – 2016-06-16 (×3): 20 meq via ORAL
  Filled 2016-06-15 (×3): qty 1

## 2016-06-15 MED ORDER — HEPARIN BOLUS VIA INFUSION
2000.0000 [IU] | Freq: Once | INTRAVENOUS | Status: AC
  Administered 2016-06-15: 2000 [IU] via INTRAVENOUS
  Filled 2016-06-15: qty 2000

## 2016-06-15 MED ORDER — ISOSORBIDE MONONITRATE ER 60 MG PO TB24
90.0000 mg | ORAL_TABLET | Freq: Every day | ORAL | Status: DC
  Administered 2016-06-15 – 2016-06-16 (×2): 90 mg via ORAL
  Filled 2016-06-15 (×2): qty 1

## 2016-06-15 MED ORDER — HEPARIN (PORCINE) IN NACL 100-0.45 UNIT/ML-% IJ SOLN
1050.0000 [IU]/h | INTRAMUSCULAR | Status: DC
  Administered 2016-06-15: 850 [IU]/h via INTRAVENOUS
  Filled 2016-06-15: qty 250

## 2016-06-15 MED ORDER — POTASSIUM CHLORIDE CRYS ER 20 MEQ PO TBCR
EXTENDED_RELEASE_TABLET | ORAL | Status: AC
  Administered 2016-06-15: 20 meq via ORAL
  Filled 2016-06-15: qty 1

## 2016-06-15 MED ORDER — LISINOPRIL 10 MG PO TABS
10.0000 mg | ORAL_TABLET | Freq: Every day | ORAL | Status: DC
  Administered 2016-06-15 – 2016-06-16 (×2): 10 mg via ORAL
  Filled 2016-06-15 (×2): qty 1

## 2016-06-15 MED ORDER — RANOLAZINE ER 500 MG PO TB12
500.0000 mg | ORAL_TABLET | Freq: Two times a day (BID) | ORAL | Status: DC
  Administered 2016-06-15: 500 mg via ORAL
  Filled 2016-06-15 (×3): qty 1

## 2016-06-15 MED ORDER — ATORVASTATIN CALCIUM 40 MG PO TABS
40.0000 mg | ORAL_TABLET | Freq: Every day | ORAL | Status: DC
  Administered 2016-06-15: 40 mg via ORAL
  Filled 2016-06-15: qty 1

## 2016-06-15 MED ORDER — METOPROLOL TARTRATE 25 MG PO TABS
37.5000 mg | ORAL_TABLET | Freq: Two times a day (BID) | ORAL | Status: DC
  Filled 2016-06-15 (×2): qty 2

## 2016-06-15 MED ORDER — ESCITALOPRAM OXALATE 10 MG PO TABS
20.0000 mg | ORAL_TABLET | Freq: Every day | ORAL | Status: DC
  Administered 2016-06-15 – 2016-06-16 (×2): 20 mg via ORAL
  Filled 2016-06-15 (×2): qty 2

## 2016-06-15 MED ORDER — SODIUM CHLORIDE 0.9% FLUSH
3.0000 mL | Freq: Two times a day (BID) | INTRAVENOUS | Status: DC
  Administered 2016-06-15 – 2016-06-16 (×3): 3 mL via INTRAVENOUS

## 2016-06-15 MED ORDER — METOPROLOL TARTRATE 50 MG PO TABS
50.0000 mg | ORAL_TABLET | Freq: Two times a day (BID) | ORAL | Status: DC
  Administered 2016-06-15: 50 mg via ORAL
  Filled 2016-06-15: qty 1

## 2016-06-15 NOTE — H&P (Signed)
History and Physical    Bethany Wang [id number] DOB: Dec 26, 1965 DOA: 06/14/2016  PCP: [name], MD  Patient coming from: Home.    Chief Complaint:  Chest pain.  HPI: Bethany Wang is an 51 y.o. female with hx of known CAD, underwent DES intervention to the RCA in 2013 and subsequently DES to the LAD in 2014. She has known chronic occlusion of the RCA based on her last angiogram with good left-to-right collateralization and patency of her LAD stent. She did have moderate disease in the distal circumflex, under the care of Dr Domenic Polite, who sent her for a cath in Jan 2018, showing small caliber disease in the obtuse marginal, with patient left main and persistent occluded RCA with left to right collateral, EF 60%, recommended medical Tx, presented to the ER as she was having conflict with her alcoholic daughter and was upset, having SSCP.  In the ER, she was found to be severely hypertensive with SBP over 180, and she was started on IV Heparin drip and IV NTG drip.  She is now pain free. Her troponin was negative, and her EKG showed no hyperacute ST T changes.  Cardiology at [facility] was consulted by EDP, recommended medical Tx and admission here at [facility]  She is now pain free and her SBP 140.    ED Course:  See above.  Rewiew of Systems:  Constitutional: Negative for malaise, fever and chills. No significant weight loss or weight gain Eyes: Negative for eye pain, redness and discharge, diplopia, visual changes, or flashes of light. ENMT: Negative for ear pain, hoarseness, nasal congestion, sinus pressure and sore throat. No headaches; tinnitus, drooling, or problem swallowing. Cardiovascular: Negative for palpitations, diaphoresis, dyspnea and peripheral edema. ; No orthopnea, PND Respiratory: Negative for cough, hemoptysis, wheezing and stridor. No pleuritic chestpain. Gastrointestinal: Negative for diarrhea, constipation,  melena, blood in stool, hematemesis, jaundice and rectal bleeding.     Genitourinary: Negative for frequency, dysuria, incontinence,flank pain and hematuria; Musculoskeletal: Negative for back pain and neck pain. Negative for swelling and trauma.;  Skin: . Negative for pruritus, rash, abrasions, bruising and skin lesion.; ulcerations Neuro: Negative for headache, lightheadedness and neck stiffness. Negative for weakness, altered level of consciousness , altered mental status, extremity weakness, burning feet, involuntary movement, seizure and syncope.  Psych: negative for anxiety, depression, insomnia, tearfulness, panic attacks, hallucinations, paranoia, suicidal or homicidal ideation    Past Medical History:  Diagnosis Date  . Anemia   . Bilateral breast cysts 12/30/2015  . Breast cancer, right breast ([place])   . BV (bacterial vaginosis) 09/09/2015  . Coronary atherosclerosis of native coronary artery    DES to RCA 03/2012, DES to LAD at [facility] 05/2012  . Cyst of pharynx or nasopharynx    [name]  . Essential hypertension   . Falls    x 3-4 in past year  . GERD (gastroesophageal reflux disease)   . History of breast cancer 09/09/2015  . History of hematuria   . Mixed hyperlipidemia   . PUD (peptic ulcer disease)   . Seizures ([place])   . Shingles 09/09/2015  . Suicide attempt    3 attempts in remote past  . Type 2 diabetes mellitus ([place])   . Uterine cancer [facility])     Past Surgical History:  Procedure Laterality Date  . ABDOMINAL HYSTERECTOMY    . BLADDER SURGERY    . CARDIAC CATHETERIZATION N/A 05/10/2016   Procedure: Left Heart Cath and Coronary Angiography;  Surgeon: Mallie Mussel  Nicholes Stairs, MD;  Location: [place] [facility];  Service: Cardiovascular;  Laterality: N/A;  . COLONOSCOPY  May 2010   Fleishman: normal rectum, internal hemorrhoids, , benign colonic polyp  . ESOPHAGOGASTRODUODENOSCOPY     2001 Dr. Amedeo Plenty: distal esophagitis, small hiatal hernia,. Dr. Tamala Julian 2006? no records available currently, pt also reports  EGD a few years ago  with Dr. Gala Romney, do not have these reports anywhere in medical records  . ESOPHAGOGASTRODUODENOSCOPY  06/02/2011   [id number] pill impaction as described above s/p dilation of a probable cervical esophageal web/bx abnormal esophageal and gastric mucosa. + H.pylori gastritis   . EYE SURGERY     Removed glass  . HAND SURGERY Right   . Left breast lumpectomy     Benign  . LEFT HEART CATHETERIZATION WITH CORONARY ANGIOGRAM N/A 07/03/2014   Procedure: LEFT HEART CATHETERIZATION WITH CORONARY ANGIOGRAM;  Surgeon: Belva Crome, MD;  Location: [facility] CATH LAB;  Service: Cardiovascular;  Laterality: N/A;  . SHOULDER SURGERY Left      reports that she quit smoking about 4 years ago. Her smoking use included Cigarettes. She started smoking about 32 years ago. She has a 6.40 pack-year smoking history. She has never used smokeless tobacco. She reports that she does not drink alcohol or use drugs.  Allergies  Allergen Reactions  . Penicillins Anaphylaxis    Has patient had a PCN reaction causing immediate rash, facial/tongue/throat swelling, SOB or lightheadedness with hypotension: Yes Has patient had a PCN reaction causing severe rash involving mucus membranes or skin necrosis: Yes Has patient had a PCN reaction that required hospitalization Yes Has patient had a PCN reaction occurring within the last 10 years: Yes If all of the above answers are "NO", then may proceed with Cephalosporin use. Patient states she carries an epi-pen    Family History  Problem Relation Age of Onset  . Colon cancer Paternal Grandfather   . Cancer Father   . Cancer Maternal Uncle   . Alzheimer's disease Paternal Aunt   . Cirrhosis Maternal Uncle   . Cancer Paternal Aunt     lung  . Heart disease Brother   . Heart attack Brother   . Mental illness Daughter   . ADD / ADHD Daughter   . Bipolar disorder Daughter   . Mental illness Son   . ADD / ADHD Son   . Bipolar disorder Son   . Mental illness Son   . ADD / ADHD  Son   . Bipolar disorder Son      Prior to Admission medications   Medication Sig Start Date End Date Taking? Authorizing Provider  aspirin EC 81 MG tablet Take 81 mg by mouth daily.    Historical Provider, MD  EPINEPHrine 0.3 mg/0.3 mL IJ SOAJ injection Inject 0.3 mg into the muscle as needed (allergic reaction).     Historical Provider, MD  escitalopram (LEXAPRO) 20 MG tablet Take 20 mg by mouth daily.  11/03/15   Historical Provider, MD  fluconazole (DIFLUCAN) 150 MG tablet Take 1 now and 1 in 3 days 06/01/16   Estill Dooms, NP  ibuprofen (ADVIL,MOTRIN) 200 MG tablet Take 400 mg by mouth at bedtime as needed for moderate pain.    Historical Provider, MD  isosorbide mononitrate (IMDUR) 60 MG 24 hr tablet Take 1 tablet (60 mg total) by mouth 2 (two) times daily. 06/16/15   Satira Sark, MD  lisinopril (PRINIVIL,ZESTRIL) 5 MG tablet Take 5 mg by mouth daily.  04/19/16   Historical Provider, MD  metoprolol tartrate (LOPRESSOR) 25 MG tablet Take 25 mg by mouth 2 (two) times daily.  03/18/16   Historical Provider, MD  naproxen sodium (ANAPROX) 220 MG tablet Take 440 mg by mouth at bedtime as needed (pain).    Historical Provider, MD  nitroGLYCERIN (NITROSTAT) 0.4 MG SL tablet Place 1 tablet (0.4 mg total) under the tongue every 5 (five) minutes x 3 doses as needed for chest pain. If no relief after 3rd dose, proceed to the ED for an evaluation 12/17/14   Satira Sark, MD  ranolazine (RANEXA) 500 MG 12 hr tablet Take 1 tablet (500 mg total) by mouth 2 (two) times daily. 05/28/16   Satira Sark, MD    Physical Exam: Vitals:   06/15/16 0245 06/15/16 0300 06/15/16 0315 06/15/16 0330  BP: 146/86 159/89 149/86 153/94  Pulse: (!) 58 66 64 77  Resp: 13 18 15 23   Temp:      TempSrc:      SpO2: 98% 97% 98% 100%  Weight:      Height:       Constitutional: NAD, calm, comfortable Vitals:   06/15/16 0245 06/15/16 0300 06/15/16 0315 06/15/16 0330  BP: 146/86 159/89 149/86 153/94  Pulse:  (!) 58 66 64 77  Resp: 13 18 15 23   Temp:      TempSrc:      SpO2: 98% 97% 98% 100%  Weight:      Height:       Eyes: PERRL, lids and conjunctivae normal ENMT: Mucous membranes are moist. Posterior pharynx clear of any exudate or lesions.Normal dentition.  Neck: normal, supple, no masses, no thyromegaly Respiratory: clear to auscultation bilaterally, no wheezing, no crackles. Normal respiratory effort. No accessory muscle use.  Cardiovascular: Regular rate and rhythm, no murmurs / rubs / gallops. No extremity edema. 2+ pedal pulses. No carotid bruits.  Abdomen: no tenderness, no masses palpated. No hepatosplenomegaly. Bowel sounds positive.  Musculoskeletal: no clubbing / cyanosis. No joint deformity upper and lower extremities. Good ROM, no contractures. Normal muscle tone.  Skin: no rashes, lesions, ulcers. No induration Neurologic: CN 2-12 grossly intact. Sensation intact, DTR normal. Strength 5/5 in all 4.  Psychiatric: Normal judgment and insight. Alert and oriented x 3. Normal mood.   Labs on Admission: I have personally reviewed following labs and imaging studies  CBC:  Recent Labs Lab 06/14/16 2349  WBC 6.2  NEUTROABS 1.8  HGB 12.9  HCT 37.9  MCV 83.5  PLT 123XX123   Basic Metabolic Panel:  Recent Labs Lab 06/14/16 2349  NA 138  K 3.2*  CL 106  CO2 26  GLUCOSE 107*  BUN 12  CREATININE 0.71  CALCIUM 9.3   GFR: Coagulation Profile:  Recent Labs Lab 06/14/16 2349  INR 0.91   Cardiac Enzymes:  Recent Labs Lab 06/14/16 2349  TROPONINI <0.03   Radiological Exams on Admission: Dg Chest Portable 1 View  Result Date: 06/14/2016 CLINICAL DATA:  Left-sided chest pain starting 2 days ago EXAM: PORTABLE CHEST 1 VIEW COMPARISON:  02/09/2016 CXR FINDINGS: The heart size and mediastinal contours are within normal limits. Both lungs are clear. The visualized skeletal structures are unremarkable. IMPRESSION: No active disease. Electronically Signed   By: Ashley Royalty  M.D.   On: 06/14/2016 23:17    EKG: Independently reviewed.   Assessment/Plan Principal Problem:   Unstable angina pectoris ([place]) Active Problems:   HLD (hyperlipidemia)   Panic attacks   Hypertensive urgency  Unstable angina (HCC)    PLAN:   Unstable angina with hypertensive urgency:  Will continue with IV Heparin and IV NTG for now.  Continue with her meds including Renexa, ASA, BB, and ACE I.   Will increase her BB dosage along with her Lisinopril doses.  Hold Oral Imdur while on IV heparin drip.  Consult cardiology in the am.  Please note Dr Grayland Ormond note saying that if PTCA was to be done, the smallest available stent we currently have is probably too big for her distal obtuse marginal vessel at the blockage site.  Will add Lipitor at 40mg  per day for plaque stabalization.   HTN urgency:  Will increase BB and ACE I.  Continue with IV NTG for tonight.    DVT prophylaxis: FULL Heparin.  Code Status: FULL CODE.  Family Communication: Fiance at bedside with her permission.  Disposition Plan: to home when appropriate.  Consults called: cardiology.  Admission status: OBS.    Giliana Vantil MD FACP. [facility]  If 7PM-7AM, please contact night-coverage [email or web address] Password [email or web address]  06/15/2016, 3:40 AM

## 2016-06-15 NOTE — Progress Notes (Signed)
Patient was admitted early this AM after midnight and H and P has been reviewed and I am in agreement with the Current Assessment and Plan done by Dr. Marin Comment. Additional changes to the Plan of Care have been made. The patient is a 51 year old Female with a PMH of known CAD s/p PCI and DES to RCA in and DES to LAD who presented to [facility] ED with cc Chest Pain. Patient had not been feeling well for the last several days and got in a argument with her daughter over social issues about taking care of her daughters children. Patient subsequently stated that she started having blurred vision and checked her BP and it was severely elevated so she came to the ER for evaluation. She was started on IV Nitroglycerin gtt and Heparin gtt. Cardiology was consulted by EDP and still currently awaiting evaluation. She was hypokalemic and repeat BMP is still pending. Will wean her off of Nitroglycerin gtt and defer to Cardiology for further workup and keep her on Heparin gtt for Unstable Angina until she is evaluated by the Cardiologist, though it seems like it was CP 2/2 to Hypertensive Emergency. Continue to monitor closely and keep in SDU and possibly transfer to floor later today.

## 2016-06-15 NOTE — Care Management Obs Status (Signed)
[facility] NOTIFICATION   Patient Details  Name: Bethany Wang MRN: [id number] Date of Birth: June 10, 1965   Medicare Observation Status Notification Given:  Yes    Aalyah Mansouri, Chauncey Reading, RN 06/15/2016, 2:14 PM

## 2016-06-15 NOTE — Consult Note (Signed)
Primary cardiologist: Dr Johnny Bridge Consulting cardiologist: Dr Carlyle Dolly Requesting physician: Dr Alfredia Ferguson Indication: chest pain  Clinical Summary [name] is a 51 y.o.female history of CAD admitted with chest pain.   Last cath Jan 2018 showed patent LAD stent, jailed diagnonal, occluded RCA at stent with left to right collaterals, OM disease small disease managed medically. The OM was thought to be the source of her angina at the time. Could consider CTO PCI of RCA is symptoms become refractory. She was started on ranexa 500mg  bid  Admitted with chest pain after argument with her daughter. . In ER SBP in 180s. Started on IV heparin, IV NG. She has had ongoing chest pain for the last several months. Typically brought on by stress or arguments related to her daughter. Sharp/pressure pain in midchest, +nausea, + diaphoresis left arm numbness. Often imroves with her SL NG.   WBC 6.2 Hgb 12.9 Plt 202 K 3.2 Cr 0.71  Trop neg x 3 CXR no acute process EKG SR chronic TWI anterior leads  Allergies  Allergen Reactions  . Penicillins Anaphylaxis    Has patient had a PCN reaction causing immediate rash, facial/tongue/throat swelling, SOB or lightheadedness with hypotension: Yes Has patient had a PCN reaction causing severe rash involving mucus membranes or skin necrosis: Yes Has patient had a PCN reaction that required hospitalization Yes Has patient had a PCN reaction occurring within the last 10 years: Yes If all of the above answers are "NO", then may proceed with Cephalosporin use. Patient states she carries an epi-pen    Medications Scheduled Medications: . aspirin EC  81 mg Oral Daily  . atorvastatin  40 mg Oral q1800  . escitalopram  20 mg Oral Daily  . lisinopril  10 mg Oral Daily  . metoprolol tartrate  50 mg Oral BID  . potassium chloride  20 mEq Oral Daily  . ranolazine  500 mg Oral BID  . sodium chloride flush  3 mL Intravenous Q12H     Infusions: . heparin  850 Units/hr (06/15/16 1004)     PRN Medications:  morphine injection   Past Medical History:  Diagnosis Date  . Anemia   . Bilateral breast cysts 12/30/2015  . Breast cancer, right breast ([place])   . BV (bacterial vaginosis) 09/09/2015  . Coronary atherosclerosis of native coronary artery    DES to RCA 03/2012, DES to LAD at [facility] 05/2012  . Cyst of pharynx or nasopharynx    [name]  . Essential hypertension   . Falls    x 3-4 in past year  . GERD (gastroesophageal reflux disease)   . History of breast cancer 09/09/2015  . History of hematuria   . Mixed hyperlipidemia   . PUD (peptic ulcer disease)   . Seizures ([place])   . Shingles 09/09/2015  . Suicide attempt    3 attempts in remote past  . Type 2 diabetes mellitus ([place])   . Uterine cancer [facility])     Past Surgical History:  Procedure Laterality Date  . ABDOMINAL HYSTERECTOMY    . BLADDER SURGERY    . CARDIAC CATHETERIZATION N/A 05/10/2016   Procedure: Left Heart Cath and Coronary Angiography;  Surgeon: Belva Crome, MD;  Location: [place] CV LAB;  Service: Cardiovascular;  Laterality: N/A;  . COLONOSCOPY  May 2010   Fleishman: normal rectum, internal hemorrhoids, , benign colonic polyp  . ESOPHAGOGASTRODUODENOSCOPY     2001 Dr. Amedeo Plenty: distal esophagitis, small hiatal hernia,. Dr.  Tamala Julian 2006? no records available currently, pt also reports  EGD a few years ago with Dr. Gala Romney, do not have these reports anywhere in medical records  . ESOPHAGOGASTRODUODENOSCOPY  06/02/2011   [id number] pill impaction as described above s/p dilation of a probable cervical esophageal web/bx abnormal esophageal and gastric mucosa. + H.pylori gastritis   . EYE SURGERY     Removed glass  . HAND SURGERY Right   . Left breast lumpectomy     Benign  . LEFT HEART CATHETERIZATION WITH CORONARY ANGIOGRAM N/A 07/03/2014   Procedure: LEFT HEART CATHETERIZATION WITH CORONARY ANGIOGRAM;  Surgeon: Belva Crome, MD;  Location: [facility]  CATH LAB;  Service: Cardiovascular;  Laterality: N/A;  . SHOULDER SURGERY Left     Family History  Problem Relation Age of Onset  . Colon cancer Paternal Grandfather   . Cancer Father   . Cancer Maternal Uncle   . Alzheimer's disease Paternal Aunt   . Cirrhosis Maternal Uncle   . Cancer Paternal Aunt     lung  . Heart disease Brother   . Heart attack Brother   . Mental illness Daughter   . ADD / ADHD Daughter   . Bipolar disorder Daughter   . Mental illness Son   . ADD / ADHD Son   . Bipolar disorder Son   . Mental illness Son   . ADD / ADHD Son   . Bipolar disorder Son     Social History [name] reports that she quit smoking about 4 years ago. Her smoking use included Cigarettes. She started smoking about 32 years ago. She has a 6.40 pack-year smoking history. She has never used smokeless tobacco. [name] reports that she does not drink alcohol.  Review of Systems CONSTITUTIONAL: No weight loss, fever, chills, weakness or fatigue.  HEENT: Eyes: No visual loss, blurred vision, double vision or yellow sclerae. No hearing loss, sneezing, congestion, runny nose or sore throat.  SKIN: No rash or itching.  CARDIOVASCULAR: per hpi RESPIRATORY: No shortness of breath, cough or sputum.  GASTROINTESTINAL: No anorexia, nausea, vomiting or diarrhea. No abdominal pain or blood.  GENITOURINARY: no polyuria, no dysuria NEUROLOGICAL: No headache, dizziness, syncope, paralysis, ataxia, numbness or tingling in the extremities. No change in bowel or bladder control.  MUSCULOSKELETAL: No muscle, back pain, joint pain or stiffness.  HEMATOLOGIC: No anemia, bleeding or bruising.  LYMPHATICS: No enlarged nodes. No history of splenectomy.  PSYCHIATRIC: No history of depression or anxiety.      Physical Examination Blood pressure (!) 149/78, pulse (!) 57, temperature 97.5 F (36.4 C), temperature source Axillary, resp. rate 14, height 5\' 4"  (1.626 m), weight 140 lb (63.5 kg), SpO2 99  %.  Intake/Output Summary (Last 24 hours) at 06/15/16 1448 Last data filed at 06/15/16 1200  Gross per 24 hour  Intake            47.08 ml  Output                0 ml  Net            47.08 ml    HEENT: sclera clear, throat clear  Cardiovascular: regular, brady 55, no m/r/g, no jvd  Respiratory: CTAB  GI: abdomen soft, NT, ND  MSK: no LE edema  Neuro: no focal deficits  Psych: appropriate affect   Lab Results  Basic Metabolic Panel:  Recent Labs Lab 06/14/16 2349  NA 138  K 3.2*  CL 106  CO2 26  GLUCOSE  107*  BUN 12  CREATININE 0.71  CALCIUM 9.3    Liver Function Tests: No results for input(s): AST, ALT, ALKPHOS, BILITOT, PROT, ALBUMIN in the last 168 hours.  CBC:  Recent Labs Lab 06/14/16 2349 06/15/16 0845  WBC 6.2 5.9  NEUTROABS 1.8  --   HGB 12.9 13.7  HCT 37.9 40.9  MCV 83.5 84.0  PLT 202 223    Cardiac Enzymes:  Recent Labs Lab 06/14/16 2349 06/15/16 0527 06/15/16 1131  TROPONINI <0.03 <0.03 <0.03    BNP: Invalid input(s): POCBNP   ECG   Imaging Jan 2018 cath  Widely patent LAD stent. Ostial 75% second diagonal jailed by LAD stent.  Totally occluded right coronary within the previously placed stent. The right fills by left to right collaterals.  Progression of first obtuse marginal from 30% to 75-80%. The vessel caliber is in the 1.5-2 mm range and is probably best treated with medical therapy.  Widely patent left main  Normal left ventricular function.EF estimated to be 60%. EDP is 14.  RECOMMENDATIONS:  Suspect angina is coming either from the first obtuse marginal, the second diagonal which is jailed, or from the collateralized right coronary.  Intensify medical therapy. ? Add Ranexa.  Intensify risk factor modification including LDL cholesterol less than 70.  Consider CTO PCI of the RCA if symptoms become refractory.  Consider PCI on the small to moderate size first obtuse marginal, however this vessel would  possibly be oversized by our smallest stent.      Impression/Recommendations 1. CAD/Chest pain - several months of intermittent chest pain, typically brought on by family related stress - cath as described above, no great revasc targets though could consider if neccesary. No high risk cath findings - reports ongoing rash since starting ranexa, we will d/c. She is on lopressor 25mg  bid (dose increased to 50mg  bid this admit) and imdur 60mg  as antianginals. Some sinus brady, will lower lopressor to 37.5mg  bid. Stop NG drip, restart home imdur at 90mg  daily. Can consider futher titration of nitrates or possibly CCB since not tolerating ranexa.  - no evidence of ACS at this time, continue to monitor and will continue heparin at this time, likely stop heparin tomorrow. - could consider MPI to functionally assess areas of ischemia, particulary if considerations for intervention of RCA CTO in the future.    Carlyle Dolly, M.D.

## 2016-06-15 NOTE — Care Management Note (Signed)
Case Management Note  Patient Details  Name: AUDRIA ARGETSINGER MRN: [id number] Date of Birth: 1966-02-22  Subjective/Objective:   Patient adm with unstable angina. She is from home with family (fiance and his daughter, daughter and her boyfriend and kids), she reports lots of stress related to family issues. She has cane and walker if needed. Her fiance is also her aide (M-F, Sunday) for 5 hours per day. She also reports a 3 different nurses who make visits to her (one through [facility] program, one through [facility] and one with her [place]). She has PCP, fiance drives her to appointments, and has no issues affording medications.                  Action/Plan: Anticipate DC home , No CM needs.    Expected Discharge Date:  06/19/16               Expected Discharge Plan:  Home/Self Care  In-House Referral:     Discharge planning Services  CM Consult  Post Acute Care Choice:  NA Choice offered to:  NA  DME Arranged:    DME Agency:     HH Arranged:    HH Agency:     Status of Service:  Completed, signed off  If discussed at [employer] of Stay Meetings, dates discussed:    Additional Comments:  Anjenette Gerbino, Chauncey Reading, RN 06/15/2016, 2:08 PM

## 2016-06-15 NOTE — Progress Notes (Signed)
Pt states that she is currently pain free. Nitro drip currently at 62mcg/min. HR is sinus in 50s. BP 160s Sys.

## 2016-06-15 NOTE — Progress Notes (Signed)
[place] for Heparin Indication: chest pain/ACS  Allergies  Allergen Reactions  . Penicillins Anaphylaxis    Has patient had a PCN reaction causing immediate rash, facial/tongue/throat swelling, SOB or lightheadedness with hypotension: Yes Has patient had a PCN reaction causing severe rash involving mucus membranes or skin necrosis: Yes Has patient had a PCN reaction that required hospitalization Yes Has patient had a PCN reaction occurring within the last 10 years: Yes If all of the above answers are "NO", then may proceed with Cephalosporin use. Patient states she carries an epi-pen    Patient Measurements: Height: 5\' 4"  (162.6 cm) Weight: 140 lb (63.5 kg) IBW/kg (Calculated) : 54.7 Heparin Dosing Weight: HEPARIN DW (KG): 63.5   Vital Signs: Temp: 98.2 F (36.8 C) (03/06 1525) Temp Source: Oral (03/06 1525) BP: 149/78 (03/06 1200) Pulse Rate: 57 (03/06 1200)  Labs:  Recent Labs  06/14/16 2349 06/15/16 0527 06/15/16 0845 06/15/16 1131 06/15/16 1626  HGB 12.9  --  13.7  --   --   HCT 37.9  --  40.9  --   --   PLT 202  --  223  --   --   APTT 31  --   --   --   --   LABPROT 12.2  --   --   --   --   INR 0.91  --   --   --   --   HEPARINUNFRC  --   --  0.25*  --  0.17*  CREATININE 0.71  --   --   --   --   TROPONINI <0.03 <0.03  --  <0.03  --     Estimated Creatinine Clearance: 72.6 mL/min (by C-G formula based on SCr of 0.71 mg/dL).   Medical History: Past Medical History:  Diagnosis Date  . Anemia   . Bilateral breast cysts 12/30/2015  . Breast cancer, right breast ([place])   . BV (bacterial vaginosis) 09/09/2015  . Coronary atherosclerosis of native coronary artery    DES to RCA 03/2012, DES to LAD at [facility] 05/2012  . Cyst of pharynx or nasopharynx    [name]  . Essential hypertension   . Falls    x 3-4 in past year  . GERD (gastroesophageal reflux disease)   . History of breast cancer 09/09/2015   . History of hematuria   . Mixed hyperlipidemia   . PUD (peptic ulcer disease)   . Seizures ([place])   . Shingles 09/09/2015  . Suicide attempt    3 attempts in remote past  . Type 2 diabetes mellitus ([place])   . Uterine cancer ([place])    Medications:  Prescriptions Prior to Admission  Medication Sig Dispense Refill Last Dose  . aspirin EC 81 MG tablet Take 81 mg by mouth daily.   06/14/2016 at Unknown time  . EPINEPHrine 0.3 mg/0.3 mL IJ SOAJ injection Inject 0.3 mg into the muscle as needed (allergic reaction).    unknown  . escitalopram (LEXAPRO) 20 MG tablet Take 20 mg by mouth daily.    06/14/2016 at Unknown time  . ibuprofen (ADVIL,MOTRIN) 200 MG tablet Take 400 mg by mouth at bedtime as needed for moderate pain.   06/14/2016 at Unknown time  . isosorbide mononitrate (IMDUR) 60 MG 24 hr tablet Take 1 tablet (60 mg total) by mouth 2 (two) times daily. 60 tablet 6 06/14/2016 at Unknown time  . lisinopril (PRINIVIL,ZESTRIL) 5 MG tablet Take 5 mg by  mouth daily.    06/14/2016 at Unknown time  . metoprolol tartrate (LOPRESSOR) 25 MG tablet Take 25 mg by mouth 2 (two) times daily.    06/14/2016 at 1800  . naproxen sodium (ANAPROX) 220 MG tablet Take 440 mg by mouth at bedtime as needed (pain).   06/14/2016 at Unknown time  . nitroGLYCERIN (NITROSTAT) 0.4 MG SL tablet Place 1 tablet (0.4 mg total) under the tongue every 5 (five) minutes x 3 doses as needed for chest pain. If no relief after 3rd dose, proceed to the ED for an evaluation 25 tablet 3 unknown  . ranolazine (RANEXA) 500 MG 12 hr tablet Take 1 tablet (500 mg total) by mouth 2 (two) times daily. 60 tablet 3 06/14/2016 at Unknown time   Assessment: 51 yo lady on heparin drip for chest pain.  Her heparin level remains subtherpaeutic despite rate increase earlier today.  Goal of Therapy:  Heparin level 0.3-0.7 units/ml Monitor platelets by anticoagulation protocol: Yes   Plan:  Heparin bolus 2000 units and increase drip to 1050 units/hr Check  anti-Xa level in ~6 hours Continue to monitor H&H and platelets  Beverlee Nims, [facility] 06/15/2016 5:03 PM

## 2016-06-15 NOTE — Progress Notes (Addendum)
[place] for Heparin Indication: chest pain/ACS  Allergies  Allergen Reactions  . Penicillins Anaphylaxis    Has patient had a PCN reaction causing immediate rash, facial/tongue/throat swelling, SOB or lightheadedness with hypotension: Yes Has patient had a PCN reaction causing severe rash involving mucus membranes or skin necrosis: Yes Has patient had a PCN reaction that required hospitalization Yes Has patient had a PCN reaction occurring within the last 10 years: Yes If all of the above answers are "NO", then may proceed with Cephalosporin use. Patient states she carries an epi-pen    Patient Measurements: Height: 5\' 4"  (162.6 cm) Weight: 140 lb (63.5 kg) IBW/kg (Calculated) : 54.7 Heparin Dosing Weight: HEPARIN DW (KG): 63.5   Vital Signs: Temp: 98.2 F (36.8 C) (03/06 0825) Temp Source: Oral (03/06 0825) BP: 146/92 (03/06 0700) Pulse Rate: 53 (03/06 0700)  Labs:  Recent Labs  06/14/16 2349 06/15/16 0527 06/15/16 0845  HGB 12.9  --  13.7  HCT 37.9  --  40.9  PLT 202  --  223  APTT 31  --   --   LABPROT 12.2  --   --   INR 0.91  --   --   HEPARINUNFRC  --   --  0.25*  CREATININE 0.71  --   --   TROPONINI <0.03 <0.03  --     Estimated Creatinine Clearance: 72.6 mL/min (by C-G formula based on SCr of 0.71 mg/dL).   Medical History: Past Medical History:  Diagnosis Date  . Anemia   . Bilateral breast cysts 12/30/2015  . Breast cancer, right breast ([place])   . BV (bacterial vaginosis) 09/09/2015  . Coronary atherosclerosis of native coronary artery    DES to RCA 03/2012, DES to LAD at [facility] 05/2012  . Cyst of pharynx or nasopharynx    [name]  . Essential hypertension   . Falls    x 3-4 in past year  . GERD (gastroesophageal reflux disease)   . History of breast cancer 09/09/2015  . History of hematuria   . Mixed hyperlipidemia   . PUD (peptic ulcer disease)   . Seizures ([place])   . Shingles  09/09/2015  . Suicide attempt    3 attempts in remote past  . Type 2 diabetes mellitus ([place])   . Uterine cancer ([place])    Medications:  Prescriptions Prior to Admission  Medication Sig Dispense Refill Last Dose  . aspirin EC 81 MG tablet Take 81 mg by mouth daily.   06/14/2016 at Unknown time  . EPINEPHrine 0.3 mg/0.3 mL IJ SOAJ injection Inject 0.3 mg into the muscle as needed (allergic reaction).    unknown  . escitalopram (LEXAPRO) 20 MG tablet Take 20 mg by mouth daily.    06/14/2016 at Unknown time  . ibuprofen (ADVIL,MOTRIN) 200 MG tablet Take 400 mg by mouth at bedtime as needed for moderate pain.   06/14/2016 at Unknown time  . isosorbide mononitrate (IMDUR) 60 MG 24 hr tablet Take 1 tablet (60 mg total) by mouth 2 (two) times daily. 60 tablet 6 06/14/2016 at Unknown time  . lisinopril (PRINIVIL,ZESTRIL) 5 MG tablet Take 5 mg by mouth daily.    06/14/2016 at Unknown time  . metoprolol tartrate (LOPRESSOR) 25 MG tablet Take 25 mg by mouth 2 (two) times daily.    06/14/2016 at 1800  . naproxen sodium (ANAPROX) 220 MG tablet Take 440 mg by mouth at bedtime as needed (pain).   06/14/2016 at  Unknown time  . nitroGLYCERIN (NITROSTAT) 0.4 MG SL tablet Place 1 tablet (0.4 mg total) under the tongue every 5 (five) minutes x 3 doses as needed for chest pain. If no relief after 3rd dose, proceed to the ED for an evaluation 25 tablet 3 unknown  . ranolazine (RANEXA) 500 MG 12 hr tablet Take 1 tablet (500 mg total) by mouth 2 (two) times daily. 60 tablet 3 06/14/2016 at Unknown time   Assessment: Okay for Protocol, heparin started last PM by EDP.   Cardiology at [facility] was consulted by EDP, recommended medical Tx.  Goal of Therapy:  Heparin level 0.3-0.7 units/ml Monitor platelets by anticoagulation protocol: Yes   Plan:  Continue heparin infusion at 750 units/hr Check anti-Xa level in now and daily while on heparin Continue to monitor H&H and platelets  Pricilla Larsson 06/15/2016,10:01 AM    Addendum: Anti-Xa level slightly below goal. Increase Heparin to 850 units/hr. F/U 6 hour Anti-Xa level.  Pricilla Larsson, [facility] 06/15/2016 10:01 AM

## 2016-06-16 DIAGNOSIS — I160 Hypertensive urgency: Secondary | ICD-10-CM

## 2016-06-16 DIAGNOSIS — I200 Unstable angina: Secondary | ICD-10-CM | POA: Diagnosis not present

## 2016-06-16 DIAGNOSIS — F410 Panic disorder [episodic paroxysmal anxiety] without agoraphobia: Secondary | ICD-10-CM

## 2016-06-16 DIAGNOSIS — E785 Hyperlipidemia, unspecified: Secondary | ICD-10-CM

## 2016-06-16 DIAGNOSIS — R0789 Other chest pain: Secondary | ICD-10-CM | POA: Diagnosis not present

## 2016-06-16 DIAGNOSIS — I25110 Atherosclerotic heart disease of native coronary artery with unstable angina pectoris: Secondary | ICD-10-CM | POA: Diagnosis not present

## 2016-06-16 LAB — HEPARIN LEVEL (UNFRACTIONATED)
Heparin Unfractionated: 0.52 IU/mL (ref 0.30–0.70)
Heparin Unfractionated: 0.6 IU/mL (ref 0.30–0.70)

## 2016-06-16 LAB — HIV ANTIBODY (ROUTINE TESTING W REFLEX): HIV Screen 4th Generation wRfx: NONREACTIVE

## 2016-06-16 MED ORDER — ACETAMINOPHEN 500 MG PO TABS
1000.0000 mg | ORAL_TABLET | Freq: Once | ORAL | Status: AC
  Administered 2016-06-16: 1000 mg via ORAL
  Filled 2016-06-16: qty 2

## 2016-06-16 MED ORDER — LISINOPRIL 10 MG PO TABS: 10.0000 mg | ORAL_TABLET | Freq: Every day | ORAL | 0 refills | Status: DC

## 2016-06-16 MED ORDER — POTASSIUM CHLORIDE CRYS ER 20 MEQ PO TBCR: 20.0000 meq | EXTENDED_RELEASE_TABLET | Freq: Every day | ORAL | 0 refills | Status: DC

## 2016-06-16 MED ORDER — ISOSORBIDE MONONITRATE ER 30 MG PO TB24
90.0000 mg | ORAL_TABLET | Freq: Every day | ORAL | 0 refills | Status: DC
Start: 2016-06-16 — End: 2016-07-20

## 2016-06-16 MED ORDER — ATORVASTATIN CALCIUM 40 MG PO TABS: 40.0000 mg | ORAL_TABLET | Freq: Every day | ORAL | 0 refills | Status: DC

## 2016-06-16 MED ORDER — HEPARIN SODIUM (PORCINE) 5000 UNIT/ML IJ SOLN
5000.0000 [IU] | Freq: Three times a day (TID) | INTRAMUSCULAR | Status: DC
  Administered 2016-06-16: 5000 [IU] via SUBCUTANEOUS
  Filled 2016-06-16: qty 1

## 2016-06-16 MED ORDER — METOPROLOL TARTRATE 37.5 MG PO TABS: 37.5000 mg | ORAL_TABLET | Freq: Two times a day (BID) | ORAL | 0 refills | Status: DC

## 2016-06-16 NOTE — Progress Notes (Addendum)
Pt IV discontinued. Pt walking out of ICU escorted by Johnna Bullins to pt's transportation Pt AVS given &  discussed & questions answered.

## 2016-06-16 NOTE — Progress Notes (Signed)
Progress Note  Patient Name: Bethany Wang Date of Encounter: 06/16/2016  Primary Cardiologist: Dr. Domenic Polite  Subjective   Complains of Headache on IV NTG. No chest pain since yest.  Inpatient Medications    Scheduled Meds: . aspirin EC  81 mg Oral Daily  . atorvastatin  40 mg Oral q1800  . escitalopram  20 mg Oral Daily  . isosorbide mononitrate  90 mg Oral Daily  . lisinopril  10 mg Oral Daily  . metoprolol tartrate  37.5 mg Oral BID  . potassium chloride  20 mEq Oral Daily  . sodium chloride flush  3 mL Intravenous Q12H   Continuous Infusions: . heparin 1,050 Units/hr (06/15/16 1706)   PRN Meds: morphine injection   Vital Signs    Vitals:   06/16/16 0000 06/16/16 0400 06/16/16 0500 06/16/16 0719  BP: 127/73     Pulse: (!) 49     Resp: 19     Temp: 99.1 F (37.3 C) 98.7 F (37.1 C)  98 F (36.7 C)  TempSrc: Oral Oral  Oral  SpO2: 95%     Weight:   137 lb 2 oz (62.2 kg)   Height:        Intake/Output Summary (Last 24 hours) at 06/16/16 0801 Last data filed at 06/15/16 1200  Gross per 24 hour  Intake            47.08 ml  Output                0 ml  Net            47.08 ml   Filed Weights   06/14/16 2256 06/16/16 0500  Weight: 140 lb (63.5 kg) 137 lb 2 oz (62.2 kg)    Telemetry    Sinus brady in 50's  - Personally Reviewed  ECG    NSR with LVH nonspecific ST T changes- Personally Reviewed  Physical Exam   GEN: No acute distress.   Neck: No JVD Cardiac: RRR, no murmurs, rubs, or gallops.  Respiratory: Decreased breath sounds with few crackles GI: Soft, nontender, non-distended  MS: No edema; No deformity. Neuro:  Nonfocal  Psych: Normal affect   Labs    Chemistry Recent Labs Lab 06/14/16 2349  NA 138  K 3.2*  CL 106  CO2 26  GLUCOSE 107*  BUN 12  CREATININE 0.71  CALCIUM 9.3  GFRNONAA >60  GFRAA >60  ANIONGAP 6     Hematology Recent Labs Lab 06/14/16 2349 06/15/16 0845  WBC 6.2 5.9  RBC 4.54 4.87  HGB 12.9 13.7   HCT 37.9 40.9  MCV 83.5 84.0  MCH 28.4 28.1  MCHC 34.0 33.5  RDW 14.5 14.6  PLT 202 223    Cardiac Enzymes Recent Labs Lab 06/14/16 2349 06/15/16 0527 06/15/16 1131 06/15/16 1630  TROPONINI <0.03 <0.03 <0.03 <0.03   No results for input(s): TROPIPOC in the last 168 hours.   BNPNo results for input(s): BNP, PROBNP in the last 168 hours.   DDimer No results for input(s): DDIMER in the last 168 hours.   Radiology    Dg Chest Portable 1 View  Result Date: 06/14/2016 CLINICAL DATA:  Left-sided chest pain starting 2 days ago EXAM: PORTABLE CHEST 1 VIEW COMPARISON:  02/09/2016 CXR FINDINGS: The heart size and mediastinal contours are within normal limits. Both lungs are clear. The visualized skeletal structures are unremarkable. IMPRESSION: No active disease. Electronically Signed   By: Ashley Royalty M.D.   On:  06/14/2016 23:17    Cardiac Studies   Jan 2018 cath  Widely patent LAD stent. Ostial 75% second diagonal jailed by LAD stent.  Totally occluded right coronary within the previously placed stent. The right fills by left to right collaterals.  Progression of first obtuse marginal from 30% to 75-80%. The vessel caliber is in the 1.5-2 mm range and is probably best treated with medical therapy.  Widely patent left main  Normal left ventricular function.EF estimated to be 60%. EDP is 14.   RECOMMENDATIONS:  Suspect angina is coming either from the first obtuse marginal, the second diagonal which is jailed, or from the collateralized right coronary.  Intensify medical therapy. ? Add Ranexa.  Intensify risk factor modification including LDL cholesterol less than 70.  Consider CTO PCI of the RCA if symptoms become refractory.  Consider PCI on the small to moderate size first obtuse marginal, however this vessel would possibly be oversized by our smallest stent.       Patient Profile     51 y.o. female several months of intermittent chest pain, typically brought on  by family related stress - cath as described above, no great revasc targets though could consider if neccesary. No high risk cath findings   Assessment & Plan    Impression/Recommendations 1. CAD/Chest pain - several months of intermittent chest pain, typically brought on by family related stress - cath as described above, no great revasc targets though could consider if neccesary. No high risk cath findings - reports ongoing rash since starting ranexa, we will d/c. She is on lopressor 25mg  bid (dose increased to 50mg  bid this admit) and imdur 60mg  as antianginals. Some sinus brady,  lopressor lowered to 37.5mg  bid.  Headache? Secondary to NG drip which is off, restart home imdur at 90mg  daily. Probably can't futher titrate nitrates with headache but possibly CCB since not tolerating ranexa.  - no evidence of ACS at this time- troponins all negative,  will stop heparin today - could consider MPI to functionally assess areas of ischemia, particulary if considerations for intervention of RCA CTO in the future ? As outpatient.    Hypertensive emergency in setting of stress now stable.    Signed, Ermalinda Barrios, PA-C  06/16/2016, 8:01 AM    Patient seen and discussed with PA Bonnell Public, I agree with her documentation. NG drip stopped, heparin stopped. This admission lopressor increased to 37.5mg  bid and imdur increased to 90mg  daily as additional antianginals. She had a rash on ranexa and this was discontinued. Chest pain resolved this AM, no evidence of ACS. Goal will be to continue to optimize antianginal therapy, her recent cath fairly nonfavorable targets. [place] for discharge today from cardiac standpoint, we will arrange outpatient f/u in 2 weeks.    Zandra Abts MD

## 2016-06-16 NOTE — Discharge Summary (Signed)
Physician Discharge Summary  Bethany Wang:[id number] DOB: 1965-08-26 DOA: 06/14/2016  PCP: [name], MD  Admit date: 06/14/2016 Discharge date: 06/16/2016  Admitted From: Home  Disposition:  Home  Recommendations for Outpatient Follow-up:  1. Follow up with PCP within 1 week 2. Follow up with cardiology in 2 weeks 3. Take medications as prescribed at new dosages 4. Please obtain BMP/CBC in one week (at PCP visit)  Home Health: No Equipment/Devices: None  Discharge Condition:Stable CODE STATUS: Full code Diet recommendation: Heart Healthy  Brief/Interim Summary: NAHDIA Wang is an 51 y.o. female with hx of known CAD, underwent DES intervention to the RCA in 2013 and subsequently DES to the LAD in 2014. She has known chronic occlusion of the RCA based on her last angiogram with good left-to-right collateralization and patency of her LAD stent. She did have moderate disease in the distal circumflex, under the care of Dr Domenic Polite, who sent her for a cath in Jan 2018, showing small caliber disease in the obtuse marginal, with patient left main and persistent occluded RCA with left to right collateral, EF 60%, recommended medical Tx, presented to the ER as she was having conflict with her alcoholic daughter and was upset, having SSCP.  In the ER, she was found to be severely hypertensive with SBP over 180, and she was started on IV Heparin drip and IV NTG drip.  She is now pain free. Her troponin was negative, and her EKG showed no hyperacute ST T changes.  Cardiology at [facility] was consulted by EDP, recommended medical Tx and admission here at [facility]  At time of admission patient was pain free and her SBP 140.  Cardiology was consulted.  Nitro drip was stopped and medications were adjusted. Patient remained pain free during admission and reports that she feels ready to go home.  She was cleared by cardiology and was stable for discharge on 06/16/16.   Discharge Diagnoses:  Principal Problem:    Unstable angina pectoris (HCC) Active Problems:   HLD (hyperlipidemia)   Panic attacks   Hypertensive urgency   Unstable angina [facility])    Discharge Instructions  Discharge Instructions    Call MD for:  difficulty breathing, headache or visual disturbances    Complete by:  As directed    Call MD for:  extreme fatigue    Complete by:  As directed    Call MD for:  hives    Complete by:  As directed    Call MD for:  persistant dizziness or light-headedness    Complete by:  As directed    Call MD for:  persistant nausea and vomiting    Complete by:  As directed    Call MD for:  severe uncontrolled pain    Complete by:  As directed    Call MD for:  temperature >100.4    Complete by:  As directed    Diet - low sodium heart healthy    Complete by:  As directed    Discharge instructions    Complete by:  As directed    Pick up prescriptions at pharmacy and take as prescribed Schedule follow up with your PCP within 1 week Cardiology to scheduled f/u within 2 weeks   Increase activity slowly    Complete by:  As directed      Allergies as of 06/16/2016      Reactions   Penicillins Anaphylaxis   Has patient had a PCN reaction causing immediate rash, facial/tongue/throat swelling, SOB  or lightheadedness with hypotension: Yes Has patient had a PCN reaction causing severe rash involving mucus membranes or skin necrosis: Yes Has patient had a PCN reaction that required hospitalization Yes Has patient had a PCN reaction occurring within the last 10 years: Yes If all of the above answers are "NO", then may proceed with Cephalosporin use. Patient states she carries an epi-pen      Medication List    STOP taking these medications   ranolazine 500 MG 12 hr tablet Commonly known as:  RANEXA     TAKE these medications   aspirin EC 81 MG tablet Take 81 mg by mouth daily.   atorvastatin 40 MG tablet Commonly known as:  LIPITOR Take 1 tablet (40 mg total) by mouth daily at 6 PM.    EPINEPHrine 0.3 mg/0.3 mL Soaj injection Commonly known as:  EPI-PEN Inject 0.3 mg into the muscle as needed (allergic reaction).   escitalopram 20 MG tablet Commonly known as:  LEXAPRO Take 20 mg by mouth daily.   ibuprofen 200 MG tablet Commonly known as:  ADVIL,MOTRIN Take 400 mg by mouth at bedtime as needed for moderate pain.   isosorbide mononitrate 30 MG 24 hr tablet Commonly known as:  IMDUR Take 3 tablets (90 mg total) by mouth daily. What changed:  medication strength  how much to take  when to take this   lisinopril 10 MG tablet Commonly known as:  PRINIVIL,ZESTRIL Take 1 tablet (10 mg total) by mouth daily. What changed:  medication strength  how much to take   Metoprolol Tartrate 37.5 MG Tabs Take 37.5 mg by mouth 2 (two) times daily. What changed:  medication strength  how much to take   naproxen sodium 220 MG tablet Commonly known as:  ANAPROX Take 440 mg by mouth at bedtime as needed (pain).   nitroGLYCERIN 0.4 MG SL tablet Commonly known as:  NITROSTAT Place 1 tablet (0.4 mg total) under the tongue every 5 (five) minutes x 3 doses as needed for chest pain. If no relief after 3rd dose, proceed to the ED for an evaluation   potassium chloride SA 20 MEQ tablet Commonly known as:  K-DUR,KLOR-CON Take 1 tablet (20 mEq total) by mouth daily. Start taking on:  06/17/2016      Follow-up Information    [name], MD. Schedule an appointment as soon as possible for a visit in 1 week(s).   Specialty:  Internal Medicine Contact information: [place] [address] [phone]          Allergies  Allergen Reactions  . Penicillins Anaphylaxis    Has patient had a PCN reaction causing immediate rash, facial/tongue/throat swelling, SOB or lightheadedness with hypotension: Yes Has patient had a PCN reaction causing severe rash involving mucus membranes or skin necrosis: Yes Has patient had a PCN reaction that required  hospitalization Yes Has patient had a PCN reaction occurring within the last 10 years: Yes If all of the above answers are "NO", then may proceed with Cephalosporin use. Patient states she carries an epi-pen    Consultations:  Cardiology   Procedures/Studies: Dg Chest Portable 1 View  Result Date: 06/14/2016 CLINICAL DATA:  Left-sided chest pain starting 2 days ago EXAM: PORTABLE CHEST 1 VIEW COMPARISON:  02/09/2016 CXR FINDINGS: The heart size and mediastinal contours are within normal limits. Both lungs are clear. The visualized skeletal structures are unremarkable. IMPRESSION: No active disease. Electronically Signed   By: Ashley Royalty M.D.   On:  06/14/2016 23:17       Subjective: Patient seen and examined.  Asking to take a bed bath before discharge.  Reports she feels well.  Has questions about medications at discharge.  Discharge Exam: Vitals:   06/16/16 0719 06/16/16 1118  BP:    Pulse:  (!) 49  Resp:  16  Temp: 98 F (36.7 C) 98.2 F (36.8 C)   Vitals:   06/16/16 0400 06/16/16 0500 06/16/16 0719 06/16/16 1118  BP:      Pulse:    (!) 49  Resp:    16  Temp: 98.7 F (37.1 C)  98 F (36.7 C) 98.2 F (36.8 C)  TempSrc: Oral  Oral Oral  SpO2:    96%  Weight:  62.2 kg (137 lb 2 oz)    Height:        General: Pt is alert, awake, not in acute distress Cardiovascular: RRR, S1/S2 +, no rubs, no gallops Respiratory: CTA bilaterally, no wheezing, no rhonchi Abdominal: Soft, NT, ND, bowel sounds + Extremities: no edema, no cyanosis  Telemetry: Sinus bradycardia  The results of significant diagnostics from this hospitalization (including imaging, microbiology, ancillary and laboratory) are listed below for reference.     Microbiology: Recent Results (from the past 240 hour(s))  MRSA PCR Screening     Status: None   Collection Time: 06/15/16  5:22 AM  Result Value Ref Range Status   MRSA by PCR NEGATIVE NEGATIVE Final    Comment:        The GeneXpert MRSA Assay  (FDA approved for NASAL specimens only), is one component of a comprehensive MRSA colonization surveillance program. It is not intended to diagnose MRSA infection nor to guide or monitor treatment for MRSA infections.      Labs: BNP (last 3 results) No results for input(s): BNP in the last 8760 hours. Basic Metabolic Panel:  Recent Labs Lab 06/14/16 2349  NA 138  K 3.2*  CL 106  CO2 26  GLUCOSE 107*  BUN 12  CREATININE 0.71  CALCIUM 9.3   Liver Function Tests: No results for input(s): AST, ALT, ALKPHOS, BILITOT, PROT, ALBUMIN in the last 168 hours. No results for input(s): LIPASE, AMYLASE in the last 168 hours. No results for input(s): AMMONIA in the last 168 hours. CBC:  Recent Labs Lab 06/14/16 2349 06/15/16 0845  WBC 6.2 5.9  NEUTROABS 1.8  --   HGB 12.9 13.7  HCT 37.9 40.9  MCV 83.5 84.0  PLT 202 223   Cardiac Enzymes:  Recent Labs Lab 06/14/16 2349 06/15/16 0527 06/15/16 1131 06/15/16 1630  TROPONINI <0.03 <0.03 <0.03 <0.03   BNP: Invalid input(s): POCBNP CBG: No results for input(s): GLUCAP in the last 168 hours. D-Dimer No results for input(s): DDIMER in the last 72 hours. Hgb A1c No results for input(s): HGBA1C in the last 72 hours. Lipid Profile No results for input(s): CHOL, HDL, LDLCALC, TRIG, CHOLHDL, LDLDIRECT in the last 72 hours. Thyroid function studies No results for input(s): TSH, T4TOTAL, T3FREE, THYROIDAB in the last 72 hours.  Invalid input(s): FREET3 Anemia work up No results for input(s): VITAMINB12, FOLATE, FERRITIN, TIBC, IRON, RETICCTPCT in the last 72 hours. Urinalysis No results found for: COLORURINE, APPEARANCEUR, [place], [place], [place], [place], [place], [place], PROTEINUR, UROBILINOGEN, NITRITE, LEUKOCYTESUR Sepsis Labs Invalid input(s): PROCALCITONIN,  WBC,  LACTICIDVEN Microbiology Recent Results (from the past 240 hour(s))  MRSA PCR Screening     Status: None   Collection Time: 06/15/16  5:22 AM   Result  Value Ref Range Status   MRSA by PCR NEGATIVE NEGATIVE Final    Comment:        The GeneXpert MRSA Assay (FDA approved for NASAL specimens only), is one component of a comprehensive MRSA colonization surveillance program. It is not intended to diagnose MRSA infection nor to guide or monitor treatment for MRSA infections.      Time coordinating discharge: 35 minutes this included direct patient care, organizing follow up and discussing with consultants plan  SIGNED:   Loretha Stapler, MD  [facility] 06/16/2016, 11:24 AM Pager [phone] If 7PM-7AM, please contact night-coverage [email or web address] Password [email or web address]

## 2016-06-28 ENCOUNTER — Emergency Department (HOSPITAL_COMMUNITY)
Admission: EM | Admit: 2016-06-28 | Discharge: 2016-06-28 | Disposition: A | Discharge status: Home / Self Care | Payer: Medicare Other | Attending: Dermatology | Admitting: Dermatology

## 2016-06-28 ENCOUNTER — Encounter (HOSPITAL_COMMUNITY): Payer: Self-pay | Admitting: Emergency Medicine

## 2016-06-28 DIAGNOSIS — Z87891 Personal history of nicotine dependence: Secondary | ICD-10-CM | POA: Diagnosis not present

## 2016-06-28 DIAGNOSIS — E119 Type 2 diabetes mellitus without complications: Secondary | ICD-10-CM | POA: Diagnosis not present

## 2016-06-28 DIAGNOSIS — Z7982 Long term (current) use of aspirin: Secondary | ICD-10-CM | POA: Insufficient documentation

## 2016-06-28 DIAGNOSIS — I10 Essential (primary) hypertension: Secondary | ICD-10-CM | POA: Diagnosis not present

## 2016-06-28 DIAGNOSIS — Z5321 Procedure and treatment not carried out due to patient leaving prior to being seen by health care provider: Secondary | ICD-10-CM | POA: Insufficient documentation

## 2016-06-28 DIAGNOSIS — R1030 Lower abdominal pain, unspecified: Secondary | ICD-10-CM | POA: Insufficient documentation

## 2016-06-28 DIAGNOSIS — R197 Diarrhea, unspecified: Secondary | ICD-10-CM | POA: Diagnosis not present

## 2016-06-28 DIAGNOSIS — R112 Nausea with vomiting, unspecified: Secondary | ICD-10-CM | POA: Insufficient documentation

## 2016-06-28 DIAGNOSIS — I2510 Atherosclerotic heart disease of native coronary artery without angina pectoris: Secondary | ICD-10-CM | POA: Diagnosis not present

## 2016-06-28 DIAGNOSIS — Z8542 Personal history of malignant neoplasm of other parts of uterus: Secondary | ICD-10-CM | POA: Diagnosis not present

## 2016-06-28 DIAGNOSIS — Z853 Personal history of malignant neoplasm of breast: Secondary | ICD-10-CM | POA: Diagnosis not present

## 2016-06-28 LAB — COMPREHENSIVE METABOLIC PANEL
ALT: 19 U/L (ref 14–54)
AST: 20 U/L (ref 15–41)
Albumin: 4.2 g/dL (ref 3.5–5.0)
Alkaline Phosphatase: 125 U/L (ref 38–126)
Anion gap: 9 (ref 5–15)
BUN: 10 mg/dL (ref 6–20)
CHLORIDE: 100 mmol/L — AB (ref 101–111)
CO2: 25 mmol/L (ref 22–32)
CREATININE: 0.95 mg/dL (ref 0.44–1.00)
Calcium: 9.6 mg/dL (ref 8.9–10.3)
GFR calc non Af Amer: >60 mL/min (ref >60)
Glucose, Bld: 136 mg/dL — ABNORMAL HIGH (ref 65–99)
POTASSIUM: 3 mmol/L — AB (ref 3.5–5.1)
SODIUM: 134 mmol/L — AB (ref 135–145)
Total Bilirubin: 0.5 mg/dL (ref 0.3–1.2)
Total Protein: 7.7 g/dL (ref 6.5–8.1)

## 2016-06-28 LAB — CBC
HEMATOCRIT: 42.9 % (ref 36.0–46.0)
Hemoglobin: 14.3 g/dL (ref 12.0–15.0)
MCH: 28.3 pg (ref 26.0–34.0)
MCHC: 33.3 g/dL (ref 30.0–36.0)
MCV: 84.8 fL (ref 78.0–100.0)
PLATELETS: 236 10*3/uL (ref 150–400)
RBC: 5.06 MIL/uL (ref 3.87–5.11)
RDW: 14.3 % (ref 11.5–15.5)
WBC: 6.3 10*3/uL (ref 4.0–10.5)

## 2016-06-28 LAB — CBG MONITORING, ED: GLUCOSE-CAPILLARY: 184 mg/dL — AB (ref 65–99)

## 2016-06-28 NOTE — ED Triage Notes (Addendum)
Pt reports lower abd pain,nausea, v/d, since last night. Pt denies being able to keep anything down. nad noted.

## 2016-06-28 NOTE — ED Notes (Signed)
No answer in lobby when called for vitals. Per people in waiting room patient left.

## 2016-06-28 NOTE — ED Notes (Signed)
poc cbg 184.

## 2016-07-20 ENCOUNTER — Other Ambulatory Visit: Payer: Self-pay | Admitting: Cardiology

## 2016-07-20 MED ORDER — ISOSORBIDE MONONITRATE ER 30 MG PO TB24: 90.0000 mg | ORAL_TABLET | Freq: Every day | ORAL | 6 refills | Status: DC

## 2016-07-20 NOTE — Telephone Encounter (Signed)
Please give pt a call concerning her meds, she's not sure what she's supposed to be taking since being discharged from the hospital [phone]

## 2016-07-20 NOTE — Telephone Encounter (Signed)
Refilled Imdur,pt has not had it in almost a month

## 2016-08-31 NOTE — Progress Notes (Signed)
Cardiology Office Note  Date: 09/01/2016   ID: Bethany Wang, DOB [birth year], MRN [id number]  PCP: [name], MD  Primary Cardiologist: [name], MD   Chief Complaint  Patient presents with  . Coronary Artery Disease    History of Present Illness: Bethany Wang is a 51 y.o. female last seen in February. She presents for a routine follow-up visit. I reviewed interval records, she was hospitalized in March with recurrent chest pain in the setting of emotional upset and uncontrolled hypertension. She ruled out for ACS, was seen in consultation by Dr. Harl Bowie. Patient reported having rash on Ranexa which was discontinued. She was treated with beta blocker and nitrates.  She comes in today telling me that she got married in April. In the weeks around that time she states that her diet slipped, she was eating more bread than normal, her weight has gone up. She is now trying to go back to the prior diet prescribed by Dr. [name]. She has been walking regularly, drinking water. She does report angina symptoms but has been able to continue with her activities.  Current cardiac regimen includes aspirin, Lipitor, Imdur, lisinopril, and metoprolol.  Past Medical History:  Diagnosis Date  . Anemia   . Bilateral breast cysts 12/30/2015  . Breast cancer, right breast ([place])   . BV (bacterial vaginosis) 09/09/2015  . Coronary atherosclerosis of native coronary artery    DES to RCA 03/2012, DES to LAD at [facility] 05/2012  . Cyst of pharynx or nasopharynx    [name]  . Essential hypertension   . Falls    x 3-4 in past year  . GERD (gastroesophageal reflux disease)   . History of breast cancer 09/09/2015  . History of hematuria   . Mixed hyperlipidemia   . PUD (peptic ulcer disease)   . Seizures ([place])   . Shingles 09/09/2015  . Suicide attempt ([place])    3 attempts in remote past  . Type 2 diabetes mellitus ([place])   . Uterine cancer [facility])     Past Surgical  History:  Procedure Laterality Date  . ABDOMINAL HYSTERECTOMY    . BLADDER SURGERY    . CARDIAC CATHETERIZATION N/A 05/10/2016   Procedure: Left Heart Cath and Coronary Angiography;  Surgeon: Belva Crome, MD;  Location: [place] CV LAB;  Service: Cardiovascular;  Laterality: N/A;  . COLONOSCOPY  May 2010   Fleishman: normal rectum, internal hemorrhoids, , benign colonic polyp  . ESOPHAGOGASTRODUODENOSCOPY     2001 Dr. Amedeo Plenty: distal esophagitis, small hiatal hernia,. Dr. Tamala Julian 2006? no records available currently, pt also reports  EGD a few years ago with Dr. Gala Romney, do not have these reports anywhere in medical records  . ESOPHAGOGASTRODUODENOSCOPY  06/02/2011   KGM:[id number] pill impaction as described above s/p dilation of a probable cervical esophageal web/bx abnormal esophageal and gastric mucosa. + H.pylori gastritis   . EYE SURGERY     Removed glass  . HAND SURGERY Right   . Left breast lumpectomy     Benign  . LEFT HEART CATHETERIZATION WITH CORONARY ANGIOGRAM N/A 07/03/2014   Procedure: LEFT HEART CATHETERIZATION WITH CORONARY ANGIOGRAM;  Surgeon: Belva Crome, MD;  Location: [facility] CATH LAB;  Service: Cardiovascular;  Laterality: N/A;  . SHOULDER SURGERY Left     Current Outpatient Prescriptions  Medication Sig Dispense Refill  . aspirin EC 81 MG tablet Take 81 mg by mouth daily.    [name] atorvastatin (LIPITOR) 40 MG tablet  Take 1 tablet (40 mg total) by mouth daily at 6 PM. 30 tablet 0  . EPINEPHrine 0.3 mg/0.3 mL IJ SOAJ injection Inject 0.3 mg into the muscle as needed (allergic reaction).     [name] escitalopram (LEXAPRO) 20 MG tablet Take 20 mg by mouth daily.     [name] ibuprofen (ADVIL,MOTRIN) 200 MG tablet Take 400 mg by mouth at bedtime as needed for moderate pain.    . isosorbide mononitrate (IMDUR) 30 MG 24 hr tablet Take 3 tablets (90 mg total) by mouth daily. 90 tablet 6  . lisinopril (PRINIVIL,ZESTRIL) 10 MG tablet Take 1 tablet (10 mg total) by mouth daily. 30 tablet 0  .  Metoprolol Tartrate 37.5 MG TABS Take 37.5 mg by mouth 2 (two) times daily. 60 tablet 0  . naproxen sodium (ANAPROX) 220 MG tablet Take 440 mg by mouth at bedtime as needed (pain).    . nitroGLYCERIN (NITROSTAT) 0.4 MG SL tablet Place 1 tablet (0.4 mg total) under the tongue every 5 (five) minutes x 3 doses as needed for chest pain. If no relief after 3rd dose, proceed to the ED for an evaluation 25 tablet 3  . potassium chloride SA (K-DUR,KLOR-CON) 20 MEQ tablet Take 1 tablet (20 mEq total) by mouth daily. 30 tablet 0   No current facility-administered medications for this visit.    Allergies:  Penicillins   Social History: The patient  reports that she quit smoking about 4 years ago. Her smoking use included Cigarettes. She started smoking about 33 years ago. She has a 6.40 pack-year smoking history. She has never used smokeless tobacco. She reports that she does not drink alcohol or use drugs.   ROS:  Please see the history of present illness. Otherwise, complete review of systems is positive for occasional swelling in her hands and feet.  All other systems are reviewed and negative.   Physical Exam: VS:  BP 130/78   Pulse 79   Ht 5\' 4"  (1.626 m)   Wt 157 lb (71.2 kg)   SpO2 97%   BMI 26.95 kg/m , BMI Body mass index is 26.95 kg/m.  Wt Readings from Last 3 Encounters:  09/01/16 157 lb (71.2 kg)  06/28/16 137 lb (62.1 kg)  06/16/16 137 lb 2 oz (62.2 kg)    Appears comfortable. HEENT: Conjunctiva and lids normal, oropharynx clear.  Neck: Supple, no elevated JVP or carotid bruits, no thyromegaly.  Lungs: Clear to auscultation, nonlabored breathing at rest.  Cardiac: Regular rate and rhythm, no S3 or significant systolic murmur, no pericardial rub.  Abdomen: Soft, nontender, bowel sounds present.  Extremities: No pitting edema, distal pulses 2+. Skin: Warm and dry. Musculoskeletal: No kyphosis. Neuropsychiatric: Alert and oriented 3, calm.  ECG: I personally reviewed the  tracing from 06/28/2016 showed sinus rhythm with increased voltage and probable repolarization abnormalities.  Recent Labwork: 11/10/2015: TSH 3.530 06/28/2016: ALT 19; AST 20; BUN 10; Creatinine, Ser 0.95; Hemoglobin 14.3; Platelets 236; Potassium 3.0; Sodium 134     Component Value Date/Time   CHOL 186 05/21/2013 1155   TRIG 115 05/21/2013 1155   HDL 40 05/21/2013 1155   CHOLHDL 4.7 05/21/2013 1155   VLDL 23 05/21/2013 1155   LDLCALC 123 (H) 05/21/2013 1155    Other Studies Reviewed Today:  Cardiac catheterization 05/10/2016:  Widely patent LAD stent. Ostial 75% second diagonal jailed by LAD stent.  Totally occluded right coronary within the previously placed stent. The right fills by left to right collaterals.  Progression of  first obtuse marginal from 30% to 75-80%. The vessel caliber is in the 1.5-2 mm range and is probably best treated with medical therapy.  Widely patent left main  Normal left ventricular function.EF estimated to be 60%. EDP is 14.  RECOMMENDATIONS:  Suspect angina is coming either from the first obtuse marginal, the second diagonal which is jailed, or from the collateralized right coronary.  Intensify medical therapy. ? Add Ranexa.  Intensify risk factor modification including LDL cholesterol less than 70.  Consider CTO PCI of the RCA if symptoms become refractory.  Consider PCI on the small to moderate size first obtuse marginal, however this vessel would possibly be oversized by our smallest stent.  Assessment and Plan:  1. Multivessel CAD being managed medically. Cardiac catheterization from January of this year revealed widely patent LAD stent site with jailed second diagonal at 75%, occluded RCA at previous stent site with left-to-right collaterals, and small-caliber first obtuse marginal with 75% stenosis. She did not tolerate Ranexa due to rash. Plan will be to continue present regimen including beta blocker, ACE inhibitor, and nitrates. I  encouraged her to continue with plans for diet and exercise regimen.  2. Hyperlipidemia,, she is back on Lipitor.  3. Essential hypertension, blood pressure is better controlled today. She reports compliance with her current medical regimen.  4. Tobacco abuse in remission.  Current medicines were reviewed with the patient today.  Disposition: Follow-up in 4 months.  Signed, Satira Sark, MD, [facility] 09/01/2016 8:31 AM    [place] [facility] [place] [facility] [place]. [address], [place], [place] [address] Phone: [phone]; Fax: [phone]

## 2016-09-01 ENCOUNTER — Ambulatory Visit (INDEPENDENT_AMBULATORY_CARE_PROVIDER_SITE_OTHER): Payer: Medicare Other | Admitting: Cardiology

## 2016-09-01 ENCOUNTER — Encounter: Payer: Self-pay | Admitting: Cardiology

## 2016-09-01 VITALS — BP 130/78 | HR 79 | Ht 64.0 in | Wt 157.0 lb

## 2016-09-01 DIAGNOSIS — E782 Mixed hyperlipidemia: Secondary | ICD-10-CM

## 2016-09-01 DIAGNOSIS — F17201 Nicotine dependence, unspecified, in remission: Secondary | ICD-10-CM

## 2016-09-01 DIAGNOSIS — I25119 Atherosclerotic heart disease of native coronary artery with unspecified angina pectoris: Secondary | ICD-10-CM

## 2016-09-01 DIAGNOSIS — I10 Essential (primary) hypertension: Secondary | ICD-10-CM | POA: Diagnosis not present

## 2016-09-01 NOTE — Patient Instructions (Signed)
Your physician recommends that you schedule a follow-up appointment in: 4 months Dr McDowell   Your physician recommends that you continue on your current medications as directed. Please refer to the Current Medication list given to you today.   If you need a refill on your cardiac medications before your next appointment, please call your pharmacy.    Thank you for choosing [facility] !         

## 2016-09-20 ENCOUNTER — Encounter (INDEPENDENT_AMBULATORY_CARE_PROVIDER_SITE_OTHER): Payer: Self-pay | Admitting: Internal Medicine

## 2016-09-21 ENCOUNTER — Ambulatory Visit (INDEPENDENT_AMBULATORY_CARE_PROVIDER_SITE_OTHER): Payer: Medicare Other | Admitting: Internal Medicine

## 2016-09-21 ENCOUNTER — Encounter (INDEPENDENT_AMBULATORY_CARE_PROVIDER_SITE_OTHER): Payer: Self-pay | Admitting: Internal Medicine

## 2016-09-29 ENCOUNTER — Encounter: Payer: Self-pay | Admitting: Cardiology

## 2016-10-05 ENCOUNTER — Encounter (INDEPENDENT_AMBULATORY_CARE_PROVIDER_SITE_OTHER): Payer: Self-pay | Admitting: *Deleted

## 2016-10-05 ENCOUNTER — Encounter (INDEPENDENT_AMBULATORY_CARE_PROVIDER_SITE_OTHER): Payer: Self-pay | Admitting: Internal Medicine

## 2016-10-05 ENCOUNTER — Telehealth (INDEPENDENT_AMBULATORY_CARE_PROVIDER_SITE_OTHER): Payer: Self-pay | Admitting: *Deleted

## 2016-10-05 ENCOUNTER — Other Ambulatory Visit (INDEPENDENT_AMBULATORY_CARE_PROVIDER_SITE_OTHER): Payer: Self-pay | Admitting: Internal Medicine

## 2016-10-05 ENCOUNTER — Encounter (INDEPENDENT_AMBULATORY_CARE_PROVIDER_SITE_OTHER): Payer: Self-pay

## 2016-10-05 ENCOUNTER — Ambulatory Visit (INDEPENDENT_AMBULATORY_CARE_PROVIDER_SITE_OTHER): Payer: Medicare Other | Admitting: Internal Medicine

## 2016-10-05 VITALS — BP 142/84 | HR 72 | Temp 98.1°F | Ht 64.0 in | Wt 156.7 lb

## 2016-10-05 DIAGNOSIS — K625 Hemorrhage of anus and rectum: Secondary | ICD-10-CM

## 2016-10-05 NOTE — Patient Instructions (Signed)
The risks of bleeding, perforation and infection were reviewed with patient.  

## 2016-10-05 NOTE — Progress Notes (Signed)
Subjective:    Patient ID: Bethany Wang, female    DOB: 08/03/1965, 51 y.o.   MRN: [id number]  HPI Referred by Dr. [name] for rectal bleeding. She has had rectal bleeding for about a month.     She says she has a BM once every 3 days. Still sees a small amt of blood.  See blood in the commode.  She says she she is eating very little.  She says when she eats she has periumbilical pain.  She says she has lost  From 176 to 156.7 over a 3 week period of time which was intentional.  Takes Protonix for her GERD.   Last colonoscopy in 2010 by Dr. Lindalou Hose with polyps ( I will try to get those records).  09/09/2016 H and H 12.8 and 39.8    Hx of cardiac stent x 4. She states she has had 4 MIs. Followed by Dr. Domenic Polite.  Diabetic for 15-16 yrs.  Review of Systems   Past Medical History:  Diagnosis Date  . Anemia   . Bilateral breast cysts 12/30/2015  . Breast cancer, right breast ([place])   . BV (bacterial vaginosis) 09/09/2015  . Coronary atherosclerosis of native coronary artery    DES to RCA 03/2012, DES to LAD at [facility] 05/2012  . Cyst of pharynx or nasopharynx    [name]  . Essential hypertension   . Falls    x 3-4 in past year  . GERD (gastroesophageal reflux disease)   . History of breast cancer 09/09/2015  . History of hematuria   . Mixed hyperlipidemia   . PUD (peptic ulcer disease)   . Seizures ([place])   . Shingles 09/09/2015  . Suicide attempt ([place])    3 attempts in remote past  . Type 2 diabetes mellitus ([place])   . Uterine cancer [facility])     Past Surgical History:  Procedure Laterality Date  . ABDOMINAL HYSTERECTOMY    . BLADDER SURGERY    . CARDIAC CATHETERIZATION N/A 05/10/2016   Procedure: Left Heart Cath and Coronary Angiography;  Surgeon: Belva Crome, MD;  Location: [place] CV LAB;  Service: Cardiovascular;  Laterality: N/A;  . COLONOSCOPY  May 2010   Fleishman: normal rectum, internal hemorrhoids, , benign colonic polyp  .  ESOPHAGOGASTRODUODENOSCOPY     2001 Dr. Amedeo Plenty: distal esophagitis, small hiatal hernia,. Dr. Tamala Julian 2006? no records available currently, pt also reports  EGD a few years ago with Dr. Gala Romney, do not have these reports anywhere in medical records  . ESOPHAGOGASTRODUODENOSCOPY  06/02/2011   MEQ:[id number] pill impaction as described above s/p dilation of a probable cervical esophageal web/bx abnormal esophageal and gastric mucosa. + H.pylori gastritis   . EYE SURGERY     Removed glass  . HAND SURGERY Right   . Left breast lumpectomy     Benign  . LEFT HEART CATHETERIZATION WITH CORONARY ANGIOGRAM N/A 07/03/2014   Procedure: LEFT HEART CATHETERIZATION WITH CORONARY ANGIOGRAM;  Surgeon: Belva Crome, MD;  Location: [facility] CATH LAB;  Service: Cardiovascular;  Laterality: N/A;  . SHOULDER SURGERY Left     Allergies  Allergen Reactions  . Penicillins Anaphylaxis    Has patient had a PCN reaction causing immediate rash, facial/tongue/throat swelling, SOB or lightheadedness with hypotension: Yes Has patient had a PCN reaction causing severe rash involving mucus membranes or skin necrosis: Yes Has patient had a PCN reaction that required hospitalization Yes Has patient had a PCN reaction occurring within the last  10 years: Yes If all of the above answers are "NO", then may proceed with Cephalosporin use. Patient states she carries an epi-pen    Current Outpatient Prescriptions on File Prior to Visit  Medication Sig Dispense Refill  . aspirin EC 81 MG tablet Take 81 mg by mouth daily.    [name] atorvastatin (LIPITOR) 40 MG tablet Take 1 tablet (40 mg total) by mouth daily at 6 PM. 30 tablet 0  . EPINEPHrine 0.3 mg/0.3 mL IJ SOAJ injection Inject 0.3 mg into the muscle as needed (allergic reaction).     [name] escitalopram (LEXAPRO) 20 MG tablet Take 20 mg by mouth daily.     [name] ibuprofen (ADVIL,MOTRIN) 200 MG tablet Take 400 mg by mouth at bedtime as needed for moderate pain.    . isosorbide mononitrate (IMDUR) 30  MG 24 hr tablet Take 3 tablets (90 mg total) by mouth daily. 90 tablet 6  . lisinopril (PRINIVIL,ZESTRIL) 10 MG tablet Take 1 tablet (10 mg total) by mouth daily. 30 tablet 0  . Metoprolol Tartrate 37.5 MG TABS Take 37.5 mg by mouth 2 (two) times daily. 60 tablet 0  . naproxen sodium (ANAPROX) 220 MG tablet Take 440 mg by mouth at bedtime as needed (pain).    . nitroGLYCERIN (NITROSTAT) 0.4 MG SL tablet Place 1 tablet (0.4 mg total) under the tongue every 5 (five) minutes x 3 doses as needed for chest pain. If no relief after 3rd dose, proceed to the ED for an evaluation 25 tablet 3  . potassium chloride SA (K-DUR,KLOR-CON) 20 MEQ tablet Take 1 tablet (20 mEq total) by mouth daily. 30 tablet 0   No current facility-administered medications on file prior to visit.         Objective:   Physical Exam Blood pressure (!) 142/84, pulse 72, temperature 98.1 F (36.7 C), height 5\' 4"  (1.626 m), weight 156 lb 11.2 oz (71.1 kg).  Alert and oriented. Skin warm and dry. Oral mucosa is moist.   . Sclera anicteric, conjunctivae is pink. Thyroid not enlarged. No cervical lymphadenopathy. Lungs clear. Heart regular rate and rhythm.  Abdomen is soft. Bowel sounds are positive. No hepatomegaly. No abdominal masses felt. No tenderness.  No edema to lower extremities.          Assessment & Plan:  Rectal bleeding. Colonic neoplasm, polyps, AVM needs to be ruled out.  The risks of bleeding, perforation and infection were reviewed with patient.

## 2016-10-05 NOTE — Telephone Encounter (Signed)
Patient needs trilyte 

## 2016-10-06 MED ORDER — PEG 3350-KCL-NA BICARB-NACL 420 G PO SOLR: 4000.0000 mL | Freq: Once | ORAL | 0 refills | Status: AC

## 2016-10-15 ENCOUNTER — Encounter (INDEPENDENT_AMBULATORY_CARE_PROVIDER_SITE_OTHER): Payer: Self-pay

## 2016-11-01 ENCOUNTER — Encounter: Payer: Self-pay | Admitting: Obstetrics and Gynecology

## 2016-11-01 ENCOUNTER — Other Ambulatory Visit (HOSPITAL_COMMUNITY)
Admission: RE | Admit: 2016-11-01 | Discharge: 2016-11-01 | Disposition: A | Discharge status: Home / Self Care | Payer: Medicare Other | Source: Ambulatory Visit | Attending: Obstetrics and Gynecology | Admitting: Obstetrics and Gynecology

## 2016-11-01 ENCOUNTER — Ambulatory Visit (INDEPENDENT_AMBULATORY_CARE_PROVIDER_SITE_OTHER): Payer: Medicare Other | Admitting: Obstetrics and Gynecology

## 2016-11-01 VITALS — BP 144/82 | HR 94 | Ht 65.5 in | Wt 157.0 lb

## 2016-11-01 DIAGNOSIS — Z01419 Encounter for gynecological examination (general) (routine) without abnormal findings: Secondary | ICD-10-CM | POA: Insufficient documentation

## 2016-11-01 DIAGNOSIS — Z124 Encounter for screening for malignant neoplasm of cervix: Secondary | ICD-10-CM

## 2016-11-01 NOTE — Progress Notes (Signed)
Assessment:  Annual Gyn ExamIs post-hysterectomy 20 years ago for alleged uterine cancer  Recent diagnosis of melanoma being worked up Hematuria being worked up by urologist History of unstable angina  Plan:  1. pap smear done, next pap due In 1 year due to uncertain history of uterine cancer 2. return annually or prn 3    Annual mammogram advised 4 endoscopy and upper GI series being performed by gastroenterology 5. Urology workup for hematuria  Subjective:  Bethany Wang is a 51 y.o. female [phone] who presents for annual exam. No LMP recorded. Patient has had a hysterectomy. This was done 20 years ago by Dr. Edman Circle general surgeon here in town patient recalls that she had cancer of uterus but records do not confirm this, as the pathology reports do not go back that far The patient has complaints today of area being worked up by urologist, recent diagnosis of melanoma with dark blood per rectum which is being worked up by endoscopy. 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 She is strongly religious individual and found herself conflicted with her faith until the marriage was completed this is her second marriage.  The following portions of the patient's history were reviewed and updated as appropriate: allergies, current medications, past family history, past medical history, past social history, past surgical history and problem list. She is followed by Dr. Hilaria Ota and is thrilled with his care changes Past Medical History:  Diagnosis Date  . Anemia   . Bilateral breast cysts 12/30/2015  . Breast cancer, right breast ([place])   . BV (bacterial vaginosis) 09/09/2015  . Coronary atherosclerosis of native coronary artery    DES to RCA 03/2012, DES to LAD at [facility] 05/2012  . Cyst of  pharynx or nasopharynx    [name]  . Essential hypertension   . Falls    x 3-4 in past year  . GERD (gastroesophageal reflux disease)   . History of breast cancer 09/09/2015  . History of hematuria   . Mixed hyperlipidemia   . PUD (peptic ulcer disease)   . Seizures ([place])   . Shingles 09/09/2015  . Suicide attempt ([place])    3 attempts in remote past  . Type 2 diabetes mellitus ([place])   . Uterine cancer [facility])     Past Surgical History:  Procedure Laterality Date  . ABDOMINAL HYSTERECTOMY    . BLADDER SURGERY    . CARDIAC CATHETERIZATION N/A 05/10/2016   Procedure: Left Heart Cath and Coronary Angiography;  Surgeon: Belva Crome, MD;  Location: [place] CV LAB;  Service: Cardiovascular;  Laterality: N/A;  . COLONOSCOPY  May 2010   Fleishman: normal rectum, internal hemorrhoids, , benign colonic polyp  . ESOPHAGOGASTRODUODENOSCOPY     2001 Dr. Amedeo Plenty: distal esophagitis, small hiatal hernia,. Dr. Tamala Julian 2006? no records available currently, pt also reports  EGD a few years ago with Dr. Gala Romney, do not have these reports anywhere in medical records  . ESOPHAGOGASTRODUODENOSCOPY  06/02/2011   YTK:[id number] pill impaction as described above s/p dilation of a probable cervical esophageal web/bx abnormal esophageal and gastric mucosa. + H.pylori gastritis   . EYE SURGERY     Removed glass  . HAND SURGERY Right   . Left breast lumpectomy     Benign  . LEFT  HEART CATHETERIZATION WITH CORONARY ANGIOGRAM N/A 07/03/2014   Procedure: LEFT HEART CATHETERIZATION WITH CORONARY ANGIOGRAM;  Surgeon: Belva Crome, MD;  Location[facility] CATH LAB;  Service: Cardiovascular;  Laterality: N/A;  . SHOULDER SURGERY Left      Current Outpatient Prescriptions:  .  aspirin EC 81 MG tablet, Take 81 mg by mouth daily., Disp: , Rfl:  .  atorvastatin (LIPITOR) 40 MG tablet, Take 1 tablet (40 mg total) by mouth daily at 6 PM., Disp: 30 tablet, Rfl: 0 .  EPINEPHrine 0.3 mg/0.3 mL IJ SOAJ injection,  Inject 0.3 mg into the muscle as needed (allergic reaction). , Disp: , Rfl:  .  escitalopram (LEXAPRO) 20 MG tablet, Take 20 mg by mouth daily. , Disp: , Rfl:  .  ibuprofen (ADVIL,MOTRIN) 200 MG tablet, Take 400 mg by mouth at bedtime as needed for moderate pain., Disp: , Rfl:  .  isosorbide mononitrate (IMDUR) 30 MG 24 hr tablet, Take 3 tablets (90 mg total) by mouth daily., Disp: 90 tablet, Rfl: 6 .  lisinopril (PRINIVIL,ZESTRIL) 10 MG tablet, Take 1 tablet (10 mg total) by mouth daily., Disp: 30 tablet, Rfl: 0 .  Metoprolol Tartrate 37.5 MG TABS, Take 37.5 mg by mouth 2 (two) times daily., Disp: 60 tablet, Rfl: 0 .  naproxen sodium (ANAPROX) 220 MG tablet, Take 440 mg by mouth at bedtime as needed (pain)., Disp: , Rfl:  .  nitroGLYCERIN (NITROSTAT) 0.4 MG SL tablet, Place 1 tablet (0.4 mg total) under the tongue every 5 (five) minutes x 3 doses as needed for chest pain. If no relief after 3rd dose, proceed to the ED for an evaluation, Disp: 25 tablet, Rfl: 3 .  pantoprazole (PROTONIX) 40 MG tablet, Take 40 mg by mouth daily., Disp: , Rfl:  .  potassium chloride SA (K-DUR,KLOR-CON) 20 MEQ tablet, Take 1 tablet (20 mEq total) by mouth daily., Disp: 30 tablet, Rfl: 0  Review of Systems Constitutional: negative, Positive for recent melanoma recent to that hematuria both being evaluated Gastrointestinal: Genitourinary: Hematuria under urology workup  Objective:  BP (!) 144/82 (BP Location: Right Arm, Patient Position: Sitting, Cuff Size: Normal)   Pulse 94   Ht 5' 5.5" (1.664 m)   Wt 157 lb (71.2 kg)   BMI 25.73 kg/m    BMI: Body mass index is 25.73 kg/m.  General Appearance: Alert, appropriate appearance for age. No acute distress HEENT: Grossly normal Neck / Thyroid:  Cardiovascular: RRR; normal S1, S2, no murmur Lungs: CTA bilaterally Back: No CVAT Breast Exam: No masses or nodes.No dimpling, nipple retraction or discharge. Gastrointestinal: Soft, non-tender, no masses or  organomegaly Pelvic Exam: Vaginal: normal mucosa without prolapse or lesions Cervix: removed surgically Adnexa: removed surgically Uterus: absent Rectovaginal: not indicated Lymphatic Exam: Non-palpable nodes in neck, clavicular, axillary, or inguinal regions Skin: no rash or abnormalities Neurologic: Normal gait and speech, no tremor  Psychiatric: Alert and oriented, appropriate affect.  Urinalysis:Not done  Mallory Shirk. MD Pgr [phone] 9:00 AM

## 2016-11-05 LAB — CYTOLOGY - PAP
DIAGNOSIS: NEGATIVE
HPV (WINDOPATH): NOT DETECTED

## 2016-11-11 ENCOUNTER — Encounter (HOSPITAL_COMMUNITY): Payer: Self-pay

## 2016-11-11 ENCOUNTER — Emergency Department (HOSPITAL_COMMUNITY)
Admission: EM | Admit: 2016-11-11 | Discharge: 2016-11-12 | Disposition: A | Discharge status: Home / Self Care | Payer: Medicare Other | Attending: Emergency Medicine | Admitting: Emergency Medicine

## 2016-11-11 DIAGNOSIS — Z79899 Other long term (current) drug therapy: Secondary | ICD-10-CM | POA: Diagnosis not present

## 2016-11-11 DIAGNOSIS — Z853 Personal history of malignant neoplasm of breast: Secondary | ICD-10-CM | POA: Diagnosis not present

## 2016-11-11 DIAGNOSIS — R509 Fever, unspecified: Secondary | ICD-10-CM | POA: Diagnosis not present

## 2016-11-11 DIAGNOSIS — H538 Other visual disturbances: Secondary | ICD-10-CM | POA: Diagnosis present

## 2016-11-11 DIAGNOSIS — I2510 Atherosclerotic heart disease of native coronary artery without angina pectoris: Secondary | ICD-10-CM | POA: Insufficient documentation

## 2016-11-11 DIAGNOSIS — J0100 Acute maxillary sinusitis, unspecified: Secondary | ICD-10-CM | POA: Diagnosis not present

## 2016-11-11 DIAGNOSIS — Z8541 Personal history of malignant neoplasm of cervix uteri: Secondary | ICD-10-CM | POA: Insufficient documentation

## 2016-11-11 DIAGNOSIS — I10 Essential (primary) hypertension: Secondary | ICD-10-CM | POA: Diagnosis not present

## 2016-11-11 DIAGNOSIS — Z87891 Personal history of nicotine dependence: Secondary | ICD-10-CM | POA: Insufficient documentation

## 2016-11-11 DIAGNOSIS — R110 Nausea: Secondary | ICD-10-CM | POA: Diagnosis not present

## 2016-11-11 DIAGNOSIS — E119 Type 2 diabetes mellitus without complications: Secondary | ICD-10-CM | POA: Insufficient documentation

## 2016-11-11 DIAGNOSIS — Z7982 Long term (current) use of aspirin: Secondary | ICD-10-CM | POA: Insufficient documentation

## 2016-11-11 MED ORDER — LEVOFLOXACIN 750 MG PO TABS: 750.0000 mg | ORAL_TABLET | Freq: Every day | ORAL | 0 refills | Status: DC

## 2016-11-11 MED ORDER — LEVOFLOXACIN 750 MG PO TABS
750.0000 mg | ORAL_TABLET | Freq: Once | ORAL | Status: AC
  Administered 2016-11-11: 750 mg via ORAL
  Filled 2016-11-11: qty 1

## 2016-11-11 MED ORDER — TRAMADOL HCL 50 MG PO TABS
50.0000 mg | ORAL_TABLET | Freq: Once | ORAL | Status: AC
  Administered 2016-11-11: 50 mg via ORAL
  Filled 2016-11-11: qty 1

## 2016-11-11 MED ORDER — TRAMADOL HCL 50 MG PO TABS: 50.0000 mg | ORAL_TABLET | Freq: Four times a day (QID) | ORAL | 0 refills | Status: DC | PRN

## 2016-11-11 NOTE — ED Provider Notes (Signed)
[facility] DEPT Provider Note   CSN: [id number] Arrival date & time: 11/11/16  2208     History   Chief Complaint Chief Complaint  Patient presents with  . Facial Pain    HPI Bethany Wang is a 51 y.o. female.  The history is provided by the patient.  She complains of pain in the infraorbital area and some blurred vision for the last week. Symptoms started on the left side, and have moved to the right side. She has had subjective fevers, but states has not had any measured temperature over 100. She has had slight nausea. There is been nasal congestion with some clear rhinorrhea. She denies any weakness or numbness or tingling. She has tried a variety of over-the-counter sinus medications with no relief. She currently rates her pain at 8/10.  Past Medical History:  Diagnosis Date  . Anemia   . Bilateral breast cysts 12/30/2015  . Breast cancer, right breast ([place])   . BV (bacterial vaginosis) 09/09/2015  . Coronary atherosclerosis of native coronary artery    DES to RCA 03/2012, DES to LAD at [facility] 05/2012  . Cyst of pharynx or nasopharynx    [name]  . Essential hypertension   . Falls    x 3-4 in past year  . GERD (gastroesophageal reflux disease)   . History of breast cancer 09/09/2015  . History of hematuria   . Mixed hyperlipidemia   . PUD (peptic ulcer disease)   . Seizures ([place])   . Shingles 09/09/2015  . Suicide attempt ([place])    3 attempts in remote past  . Type 2 diabetes mellitus ([place])   . Uterine cancer [facility])     Patient Active Problem List   Diagnosis Date Noted  . Hypertensive urgency 06/15/2016  . Unstable angina pectoris ([place]) 06/15/2016  . Unstable angina ([place]) 06/15/2016  . Bilateral breast cysts 12/30/2015  . Vaginal discharge 09/09/2015  . BV (bacterial vaginosis) 09/09/2015  . Shingles 09/09/2015  . History of breast cancer 09/09/2015  . Syncope and collapse 08/08/2015  . Migraine with aura and with status migrainosus, not  intractable 08/08/2015  . Chronic low back pain 08/08/2015  . Insomnia 08/08/2015  . Anxiety state 08/08/2015  . Panic attacks 08/08/2015  . Anemia, iron deficiency 08/08/2015  . Pica 08/08/2015  . Angina decubitus ([place]) 07/03/2014  . 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 Encounter for gynecological examination with Papanicolaou smear of cervix 02/04/2014  . Syncope 12/14/2013  . Cervical disc disorder with radiculopathy of cervical region 02/19/2013  . H/O rotator cuff surgery 02/19/2013  . Dyspareunia 02/01/2013  . Left shoulder pain 01/24/2013  . Rotator cuff tear 01/24/2013  . Radicular pain 01/24/2013  . Labral tear of shoulder 01/24/2013  . Complex regional pain syndrome of upper extremity 01/24/2013  . Herniated disc, cervical 01/24/2013  . Abdominal pain, other specified site 01/18/2013  . Nausea and vomiting 01/18/2013  . Rectal bleeding 01/18/2013  . Edema of left foot 10/16/2012  . Coronary atherosclerosis of native coronary artery   . Essential hypertension, benign   . HLD (hyperlipidemia)   . Tobacco abuse     Past Surgical History:  Procedure Laterality Date  . ABDOMINAL HYSTERECTOMY    . BLADDER SURGERY    . CARDIAC CATHETERIZATION N/A 05/10/2016   Procedure: Left Heart Cath and Coronary Angiography;  Surgeon: Belva Crome, MD;  Location: [facility];  Service: Cardiovascular;  Laterality: N/A;  . COLONOSCOPY  May 2010  Fleishman: normal rectum, internal hemorrhoids, , benign colonic polyp  . ESOPHAGOGASTRODUODENOSCOPY     2001 Dr. Amedeo Plenty: distal esophagitis, small hiatal hernia,. Dr. Tamala Julian 2006? no records available currently, pt also reports  EGD a few years ago with Dr. Gala Romney, do not have these reports anywhere in medical records  . ESOPHAGOGASTRODUODENOSCOPY  06/02/2011   ION:[id number] pill impaction as described above s/p dilation of a probable cervical esophageal web/bx abnormal esophageal and gastric mucosa. + H.pylori gastritis   . EYE  SURGERY     Removed glass  . HAND SURGERY Right   . Left breast lumpectomy     Benign  . LEFT HEART CATHETERIZATION WITH CORONARY ANGIOGRAM N/A 07/03/2014   Procedure: LEFT HEART CATHETERIZATION WITH CORONARY ANGIOGRAM;  Surgeon: Belva Crome, MD;  Location: [facility] CATH LAB;  Service: Cardiovascular;  Laterality: N/A;  . SHOULDER SURGERY Left     OB History    Gravida Para Term Preterm AB Living   4 3     1 3    SAB TAB Ectopic Multiple Live Births   1               Home Medications    Prior to Admission medications   Medication Sig Start Date End Date Taking? Authorizing Provider  aspirin EC 81 MG tablet Take 81 mg by mouth daily.    [provider]  atorvastatin (LIPITOR) 40 MG tablet Take 1 tablet (40 mg total) by mouth daily at 6 PM. 06/16/16   Eber Jones, MD  EPINEPHrine 0.3 mg/0.3 mL IJ SOAJ injection Inject 0.3 mg into the muscle as needed (allergic reaction).     [provider]  escitalopram (LEXAPRO) 20 MG tablet Take 20 mg by mouth daily.  11/03/15   [provider]  ibuprofen (ADVIL,MOTRIN) 200 MG tablet Take 400 mg by mouth at bedtime as needed for moderate pain.    [provider]  isosorbide mononitrate (IMDUR) 30 MG 24 hr tablet Take 3 tablets (90 mg total) by mouth daily. 07/20/16   Satira Sark, MD  lisinopril (PRINIVIL,ZESTRIL) 10 MG tablet Take 1 tablet (10 mg total) by mouth daily. 06/16/16   Eber Jones, MD  Metoprolol Tartrate 37.5 MG TABS Take 37.5 mg by mouth 2 (two) times daily. 06/16/16   Eber Jones, MD  naproxen sodium (ANAPROX) 220 MG tablet Take 440 mg by mouth at bedtime as needed (pain).    [provider]  nitroGLYCERIN (NITROSTAT) 0.4 MG SL tablet Place 1 tablet (0.4 mg total) under the tongue every 5 (five) minutes x 3 doses as needed for chest pain. If no relief after 3rd dose, proceed to the ED for an evaluation 12/17/14   Satira Sark, MD  pantoprazole (PROTONIX) 40 MG  tablet Take 40 mg by mouth daily.    [provider]  potassium chloride SA (K-DUR,KLOR-CON) 20 MEQ tablet Take 1 tablet (20 mEq total) by mouth daily. 06/17/16   Eber Jones, MD    Family History Family History  Problem Relation Age of Onset  . Colon cancer Paternal Grandfather   . Cancer Father   . Cancer Maternal Uncle   . Alzheimer's disease Paternal Aunt   . Cirrhosis Maternal Uncle   . Cancer Paternal Aunt        lung  . Heart disease Brother   . Heart attack Brother   . Mental illness Daughter   . ADD / ADHD Daughter   . Bipolar disorder  Daughter   . Mental illness Son   . ADD / ADHD Son   . Bipolar disorder Son   . Mental illness Son   . ADD / ADHD Son   . Bipolar disorder Son     Social History Social History  Substance Use Topics  . Smoking status: Former Smoker    Packs/day: 0.20    Years: 32.00    Types: Cigarettes    Start date: 07/05/1983    Quit date: 04/03/2012  . Smokeless tobacco: Never Used  . Alcohol use No     Allergies   Penicillins   Review of Systems Review of Systems  All other systems reviewed and are negative.    Physical Exam Updated Vital Signs BP (!) 196/98 (BP Location: Right Arm)   Pulse 72   Temp 98 F (36.7 C) (Oral)   Resp 18   Ht 5\' 5"  (1.651 m)   Wt 71.2 kg (157 lb)   SpO2 98%   BMI 26.13 kg/m   Physical Exam  Nursing note and vitals reviewed.  51 year old female, resting comfortably and in no acute distress. Vital signs are significant for hypertension. Oxygen saturation is 98%, which is normal. Head is normocephalic and atraumatic. PERRLA, EOMI. Oropharynx is clear. Fundi show no hemorrhage, exudate, or papilledema. There is marked tenderness of the right maxillary sinus, mild tenderness of the left maxillary sinus, no tenderness over the frontal sinuses. Neck is nontender and supple without adenopathy or JVD. Back is nontender and there is no CVA tenderness. Lungs are clear without rales,  wheezes, or rhonchi. Chest is nontender. Heart has regular rate and rhythm without murmur. Abdomen is soft, flat, nontender without masses or hepatosplenomegaly and peristalsis is normoactive. Extremities have no cyanosis or edema, full range of motion is present. Skin is warm and dry without rash. Neurologic: Mental status is normal, cranial nerves are intact, there are no motor or sensory deficits.  ED Treatments / Results   Procedures Procedures (including critical care time)  Medications Ordered in ED Medications  traMADol (ULTRAM) tablet 50 mg (50 mg Oral Given 11/11/16 2328)  levofloxacin (LEVAQUIN) tablet 750 mg (750 mg Oral Given 11/11/16 2328)     Initial Impression / Assessment and Plan / ED Course  I have reviewed the triage vital signs and the nursing notes.  Pertinent labs & imaging results that were available during my care of the patient were reviewed by me and considered in my medical decision making (see chart for details).  Facial pain with exam certainly consistent with cyst acute sinusitis. No indication for imaging tonight. Visual acuity will be checked and she will be started on levofloxacin, since she is penicillin allergic. Blood pressure is for a high tonight, but review of old records shows recent office blood pressures have been in the high normal to mild hypertensive range. Elevated blood pressure tonight is felt likely related to pain. She'll need to monitor her blood pressures outpatient. She is given initial dose of tramadol and levofloxacin in the ED, and sent home with prescriptions for same. Follow-up with PCP.  Vision is decreased bilaterally, but she has not had vision checked in 12 years. Will refer to ophthalmaology  Final Clinical Impressions(s) / ED Diagnoses   Final diagnoses:  Acute non-recurrent maxillary sinusitis  Essential hypertension    New Prescriptions New Prescriptions   LEVOFLOXACIN (LEVAQUIN) 750 MG TABLET    Take 1 tablet (750 mg  total) by mouth daily. X 7 days  TRAMADOL (ULTRAM) 50 MG TABLET    Take 1 tablet (50 mg total) by mouth every 6 (six) hours as needed.     Delora Fuel, MD 28/20/60 [phone]

## 2016-11-11 NOTE — ED Triage Notes (Signed)
Reports of swelling to right side of face due to sinus infection. States she has had watering of eyes and cough. States she took Nyquil last week and chest started hurting when coughing.

## 2016-11-11 NOTE — Discharge Instructions (Signed)
Monitor your blood pressure at home. Return if symptoms are getting worse.

## 2016-11-11 NOTE — ED Notes (Signed)
Pt c/o of face pain for the past week. Says thought it was sinus related & has been taking nyquil for relief.

## 2016-11-12 NOTE — ED Notes (Signed)
Pt alert & oriented x4, stable gait. Patient given discharge instructions, paperwork & prescription(s). Patient  instructed to stop at the registration desk to finish any additional paperwork. Patient verbalized understanding. Pt left department w/ no further questions. 

## 2016-11-15 ENCOUNTER — Telehealth: Payer: Self-pay | Admitting: *Deleted

## 2016-11-15 NOTE — Telephone Encounter (Signed)
Informed pt that PAP results were normal.

## 2016-12-24 ENCOUNTER — Encounter (HOSPITAL_COMMUNITY): Payer: Self-pay | Admitting: Emergency Medicine

## 2016-12-24 ENCOUNTER — Emergency Department (HOSPITAL_COMMUNITY)
Admission: EM | Admit: 2016-12-24 | Discharge: 2016-12-24 | Disposition: A | Discharge status: Home / Self Care | Payer: Medicare Other | Attending: Emergency Medicine | Admitting: Emergency Medicine

## 2016-12-24 DIAGNOSIS — Z87891 Personal history of nicotine dependence: Secondary | ICD-10-CM | POA: Diagnosis not present

## 2016-12-24 DIAGNOSIS — E119 Type 2 diabetes mellitus without complications: Secondary | ICD-10-CM | POA: Insufficient documentation

## 2016-12-24 DIAGNOSIS — I10 Essential (primary) hypertension: Secondary | ICD-10-CM | POA: Insufficient documentation

## 2016-12-24 DIAGNOSIS — Y92009 Unspecified place in unspecified non-institutional (private) residence as the place of occurrence of the external cause: Secondary | ICD-10-CM | POA: Insufficient documentation

## 2016-12-24 DIAGNOSIS — S39012A Strain of muscle, fascia and tendon of lower back, initial encounter: Secondary | ICD-10-CM | POA: Diagnosis not present

## 2016-12-24 DIAGNOSIS — Y939 Activity, unspecified: Secondary | ICD-10-CM | POA: Insufficient documentation

## 2016-12-24 DIAGNOSIS — Z7982 Long term (current) use of aspirin: Secondary | ICD-10-CM | POA: Diagnosis not present

## 2016-12-24 DIAGNOSIS — Z79899 Other long term (current) drug therapy: Secondary | ICD-10-CM | POA: Insufficient documentation

## 2016-12-24 DIAGNOSIS — S3992XA Unspecified injury of lower back, initial encounter: Secondary | ICD-10-CM | POA: Diagnosis present

## 2016-12-24 DIAGNOSIS — I2510 Atherosclerotic heart disease of native coronary artery without angina pectoris: Secondary | ICD-10-CM | POA: Diagnosis not present

## 2016-12-24 DIAGNOSIS — X500XXA Overexertion from strenuous movement or load, initial encounter: Secondary | ICD-10-CM | POA: Diagnosis not present

## 2016-12-24 DIAGNOSIS — Y999 Unspecified external cause status: Secondary | ICD-10-CM | POA: Diagnosis not present

## 2016-12-24 MED ORDER — TRAMADOL HCL 50 MG PO TABS: ORAL_TABLET | ORAL | 0 refills | Status: DC

## 2016-12-24 MED ORDER — DIAZEPAM 5 MG PO TABS
10.0000 mg | ORAL_TABLET | Freq: Once | ORAL | Status: AC
  Administered 2016-12-24: 10 mg via ORAL
  Filled 2016-12-24: qty 2

## 2016-12-24 MED ORDER — TRAMADOL HCL 50 MG PO TABS
100.0000 mg | ORAL_TABLET | Freq: Once | ORAL | Status: AC
  Administered 2016-12-24: 100 mg via ORAL
  Filled 2016-12-24: qty 2

## 2016-12-24 MED ORDER — DEXAMETHASONE SODIUM PHOSPHATE 10 MG/ML IJ SOLN
10.0000 mg | Freq: Once | INTRAMUSCULAR | Status: AC
  Administered 2016-12-24: 10 mg via INTRAMUSCULAR
  Filled 2016-12-24: qty 1

## 2016-12-24 MED ORDER — CYCLOBENZAPRINE HCL 10 MG PO TABS: 10.0000 mg | ORAL_TABLET | Freq: Three times a day (TID) | ORAL | 0 refills | Status: DC

## 2016-12-24 NOTE — Discharge Instructions (Signed)
Heating pad to your lower back maybe helpful. Please rest your back is much as possible. Use Flexeril and ibuprofen 3 times daily. Use Ultram every 6 hours as needed for severe pain. Please see Dr. [name] for follow-up and for continued management.

## 2016-12-24 NOTE — ED Triage Notes (Signed)
Pt c/o mid and lower back pain since moving appliances x 3 days ago.

## 2016-12-24 NOTE — ED Notes (Signed)
Pt states understanding of care given and follow up instructions. Pt a/o ambulated from the ED slowly.  Meryl Crutch made aware of BP at discharge, pt to go home and take night time dose of BP medication

## 2016-12-24 NOTE — ED Provider Notes (Signed)
[facility] DEPT Provider Note   CSN: [id number] Arrival date & time: 12/24/16  1807     History   Chief Complaint Chief Complaint  Patient presents with  . Back Pain    HPI Bethany Wang is a 51 y.o. female.  Patient is a 51 year old female who presents to the emergency department with complaint of back pain.  The patient states that 3 days ago she was moving appliances because her floor flooded and she injured her back. The patient is concerned because she has been taking Tylenol, ibuprofen, and Goody powders and these are not helping her pain. She is also concerned because she's had back surgery in the past and she is fearful that she has reinjured something that was corrected and or surgery. The patient denies any loss of bowel or bladder function. She denies any numbness in the genital areas. She denies loss of feeling or motion in her lower extremities, and his been no changes in her balance. She presents to the emergency department now for assistance and particularly assistance with pain.      Past Medical History:  Diagnosis Date  . Anemia   . Bilateral breast cysts 12/30/2015  . Breast cancer, right breast ([place])   . BV (bacterial vaginosis) 09/09/2015  . Coronary atherosclerosis of native coronary artery    DES to RCA 03/2012, DES to LAD at [facility] 05/2012  . Cyst of pharynx or nasopharynx    [name]  . Essential hypertension   . Falls    x 3-4 in past year  . GERD (gastroesophageal reflux disease)   . History of breast cancer 09/09/2015  . History of hematuria   . Mixed hyperlipidemia   . PUD (peptic ulcer disease)   . Seizures ([place])   . Shingles 09/09/2015  . Suicide attempt ([place])    3 attempts in remote past  . Type 2 diabetes mellitus ([place])   . Uterine cancer [facility])     Patient Active Problem List   Diagnosis Date Noted  . Hypertensive urgency 06/15/2016  . Unstable angina pectoris ([place]) 06/15/2016  . Unstable angina ([place])  06/15/2016  . Bilateral breast cysts 12/30/2015  . Vaginal discharge 09/09/2015  . BV (bacterial vaginosis) 09/09/2015  . Shingles 09/09/2015  . History of breast cancer 09/09/2015  . Syncope and collapse 08/08/2015  . Migraine with aura and with status migrainosus, not intractable 08/08/2015  . Chronic low back pain 08/08/2015  . Insomnia 08/08/2015  . Anxiety state 08/08/2015  . Panic attacks 08/08/2015  . Anemia, iron deficiency 08/08/2015  . Pica 08/08/2015  . Angina decubitus ([place]) 07/03/2014  . 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 Encounter for gynecological examination with Papanicolaou smear of cervix 02/04/2014  . Syncope 12/14/2013  . Cervical disc disorder with radiculopathy of cervical region 02/19/2013  . H/O rotator cuff surgery 02/19/2013  . Dyspareunia 02/01/2013  . Left shoulder pain 01/24/2013  . Rotator cuff tear 01/24/2013  . Radicular pain 01/24/2013  . Labral tear of shoulder 01/24/2013  . Complex regional pain syndrome of upper extremity 01/24/2013  . Herniated disc, cervical 01/24/2013  . Abdominal pain, other specified site 01/18/2013  . Nausea and vomiting 01/18/2013  . Rectal bleeding 01/18/2013  . Edema of left foot 10/16/2012  . Coronary atherosclerosis of native coronary artery   . Essential hypertension, benign   . HLD (hyperlipidemia)   . Tobacco abuse     Past Surgical History:  Procedure Laterality Date  . ABDOMINAL HYSTERECTOMY    .  BLADDER SURGERY    . CARDIAC CATHETERIZATION N/A 05/10/2016   Procedure: Left Heart Cath and Coronary Angiography;  Surgeon: Belva Crome, MD;  Location: [facility];  Service: Cardiovascular;  Laterality: N/A;  . COLONOSCOPY  May 2010   Fleishman: normal rectum, internal hemorrhoids, , benign colonic polyp  . ESOPHAGOGASTRODUODENOSCOPY     2001 Dr. Amedeo Plenty: distal esophagitis, small hiatal hernia,. Dr. Tamala Julian 2006? no records available currently, pt also reports  EGD a few years ago with Dr.  Gala Romney, do not have these reports anywhere in medical records  . ESOPHAGOGASTRODUODENOSCOPY  06/02/2011   QQP:[id number] pill impaction as described above s/p dilation of a probable cervical esophageal web/bx abnormal esophageal and gastric mucosa. + H.pylori gastritis   . EYE SURGERY     Removed glass  . HAND SURGERY Right   . Left breast lumpectomy     Benign  . LEFT HEART CATHETERIZATION WITH CORONARY ANGIOGRAM N/A 07/03/2014   Procedure: LEFT HEART CATHETERIZATION WITH CORONARY ANGIOGRAM;  Surgeon: Belva Crome, MD;  Location: [facility] CATH LAB;  Service: Cardiovascular;  Laterality: N/A;  . SHOULDER SURGERY Left     OB History    Gravida Para Term Preterm AB Living   4 3     1 3    SAB TAB Ectopic Multiple Live Births   1               Home Medications    Prior to Admission medications   Medication Sig Start Date End Date Taking? Authorizing Provider  aspirin EC 81 MG tablet Take 81 mg by mouth daily.    [provider]  atorvastatin (LIPITOR) 40 MG tablet Take 1 tablet (40 mg total) by mouth daily at 6 PM. 06/16/16   Eber Jones, MD  EPINEPHrine 0.3 mg/0.3 mL IJ SOAJ injection Inject 0.3 mg into the muscle as needed (allergic reaction).     [provider]  escitalopram (LEXAPRO) 20 MG tablet Take 20 mg by mouth daily.  11/03/15   [provider]  ibuprofen (ADVIL,MOTRIN) 200 MG tablet Take 400 mg by mouth at bedtime as needed for moderate pain.    [provider]  isosorbide mononitrate (IMDUR) 30 MG 24 hr tablet Take 3 tablets (90 mg total) by mouth daily. 07/20/16   Satira Sark, MD  levofloxacin (LEVAQUIN) 750 MG tablet Take 1 tablet (750 mg total) by mouth daily. X 7 days 06/11/65   Delora Fuel, MD  lisinopril (PRINIVIL,ZESTRIL) 10 MG tablet Take 1 tablet (10 mg total) by mouth daily. 06/16/16   Eber Jones, MD  Metoprolol Tartrate 37.5 MG TABS Take 37.5 mg by mouth 2 (two) times daily. 06/16/16   Eber Jones, MD    naproxen sodium (ANAPROX) 220 MG tablet Take 440 mg by mouth at bedtime as needed (pain).    [provider]  nitroGLYCERIN (NITROSTAT) 0.4 MG SL tablet Place 1 tablet (0.4 mg total) under the tongue every 5 (five) minutes x 3 doses as needed for chest pain. If no relief after 3rd dose, proceed to the ED for an evaluation 12/17/14   Satira Sark, MD  pantoprazole (PROTONIX) 40 MG tablet Take 40 mg by mouth daily.    [provider]  potassium chloride SA (K-DUR,KLOR-CON) 20 MEQ tablet Take 1 tablet (20 mEq total) by mouth daily. 06/17/16   Eber Jones, MD  traMADol (ULTRAM) 50 MG tablet Take 1 tablet (50 mg total) by mouth every 6 (  six) hours as needed. 12/12/40   Delora Fuel, MD    Family History Family History  Problem Relation Age of Onset  . Colon cancer Paternal Grandfather   . Cancer Father   . Cancer Maternal Uncle   . Alzheimer's disease Paternal Aunt   . Cirrhosis Maternal Uncle   . Cancer Paternal Aunt        lung  . Heart disease Brother   . Heart attack Brother   . Mental illness Daughter   . ADD / ADHD Daughter   . Bipolar disorder Daughter   . Mental illness Son   . ADD / ADHD Son   . Bipolar disorder Son   . Mental illness Son   . ADD / ADHD Son   . Bipolar disorder Son     Social History Social History  Substance Use Topics  . Smoking status: Former Smoker    Packs/day: 0.20    Years: 32.00    Types: Cigarettes    Start date: 07/05/1983    Quit date: 04/03/2012  . Smokeless tobacco: Never Used  . Alcohol use No     Allergies   Penicillins   Review of Systems Review of Systems  Constitutional: Negative for activity change.       All ROS Neg except as noted in HPI  HENT: Negative for nosebleeds.   Eyes: Negative for photophobia and discharge.  Respiratory: Negative for cough, shortness of breath and wheezing.   Cardiovascular: Negative for chest pain and palpitations.  Gastrointestinal: Negative for abdominal pain and  blood in stool.  Genitourinary: Negative for dysuria, frequency and hematuria.  Musculoskeletal: Positive for back pain. Negative for arthralgias and neck pain.  Skin: Negative.   Neurological: Negative for dizziness, seizures and speech difficulty.  Psychiatric/Behavioral: Negative for confusion and hallucinations.     Physical Exam Updated Vital Signs BP (!) 161/96 (BP Location: Right Arm)   Pulse 88   Temp 98.2 F (36.8 C) (Oral)   Resp 16   Wt 69.9 kg (154 lb)   SpO2 96%   BMI 25.63 kg/m   Physical Exam  Constitutional: She is oriented to person, place, and time. She appears well-developed and well-nourished.  Non-toxic appearance.  HENT:  Head: Normocephalic.  Right Ear: Tympanic membrane and external ear normal.  Left Ear: Tympanic membrane and external ear normal.  Eyes: Pupils are equal, round, and reactive to light. EOM and lids are normal.  Neck: Normal range of motion. Neck supple. Carotid bruit is not present.  Cardiovascular: Normal rate, regular rhythm, normal heart sounds, intact distal pulses and normal pulses.   Pulmonary/Chest: Breath sounds normal. No respiratory distress.  Abdominal: Soft. Bowel sounds are normal. There is no tenderness. There is no guarding.  Musculoskeletal: Normal range of motion.  Patient demonstrates pain of the upper to the lower lumbar area. Right greater than left area. There is no palpable step off of the cervical, thoracic, or lumbar spine. There no hot areas appreciated.  Lymphadenopathy:       Head (right side): No submandibular adenopathy present.       Head (left side): No submandibular adenopathy present.    She has no cervical adenopathy.  Neurological: She is alert and oriented to person, place, and time. She has normal strength. No cranial nerve deficit or sensory deficit.  There no motor or sensory deficits appreciated at this time.  Skin: Skin is warm and dry.  Psychiatric: She has a normal mood and affect. Her speech  is normal.  Nursing note and vitals reviewed.    ED Treatments / Results  Labs (all labs ordered are listed, but only abnormal results are displayed) Labs Reviewed - No data to display  EKG  EKG Interpretation None       Radiology No results found.  Procedures Procedures (including critical care time)  Medications Ordered in ED Medications - No data to display   Initial Impression / Assessment and Plan / ED Course  I have reviewed the triage vital signs and the nursing notes.  Pertinent labs & imaging results that were available during my care of the patient were reviewed by me and considered in my medical decision making (see chart for details).       Final Clinical Impressions(s) / ED Diagnoses MDM Blood pressure is elevated at 161/96, otherwise vital signs within normal limits. No gross neurologic deficit appreciated at this time. I suspect the patient has a muscle strain involving her lower back following overuse. The patient will be treated with Flexeril, ibuprofen, and Ultram. The patient is advised to see her primary physician next week for recheck .   Final diagnoses:  Strain of lumbar region, initial encounter    New Prescriptions New Prescriptions   CYCLOBENZAPRINE (FLEXERIL) 10 MG TABLET    Take 1 tablet (10 mg total) by mouth 3 (three) times daily.   TRAMADOL (ULTRAM) 50 MG TABLET    1 or 2 po q6h prn pain     Lily Kocher, PA-C 12/24/16 2030    Milton Ferguson, MD 12/25/16 1226

## 2016-12-31 ENCOUNTER — Encounter: Payer: Self-pay | Admitting: Cardiology

## 2016-12-31 ENCOUNTER — Ambulatory Visit (INDEPENDENT_AMBULATORY_CARE_PROVIDER_SITE_OTHER): Payer: Medicare Other | Admitting: Cardiology

## 2016-12-31 VITALS — BP 176/90 | HR 90 | Ht 64.0 in | Wt 153.0 lb

## 2016-12-31 DIAGNOSIS — I25119 Atherosclerotic heart disease of native coronary artery with unspecified angina pectoris: Secondary | ICD-10-CM

## 2016-12-31 DIAGNOSIS — I10 Essential (primary) hypertension: Secondary | ICD-10-CM

## 2016-12-31 DIAGNOSIS — F17201 Nicotine dependence, unspecified, in remission: Secondary | ICD-10-CM

## 2016-12-31 DIAGNOSIS — E782 Mixed hyperlipidemia: Secondary | ICD-10-CM | POA: Diagnosis not present

## 2016-12-31 NOTE — Patient Instructions (Signed)
Your physician wants you to follow-up in:  4 months with Dr.McDowell You will receive a reminder letter in the mail two months in advance. If you don't receive a letter, please call our office to schedule the follow-up appointment.    Your physician recommends that you continue on your current medications as directed. Please refer to the Current Medication list given to you today.   If you need a refill on your cardiac medications before your next appointment, please call your pharmacy.    No lab work or tests ordered today.       Thank you for choosing [facility] !

## 2016-12-31 NOTE — Progress Notes (Signed)
.    Cardiology Office Note  Date: 12/31/2016   ID: Bethany Wang, DOB 08-23-1965, MRN [id number]  PCP: [name], MD  Primary Cardiologist: [name], MD   Chief Complaint  Patient presents with  . Coronary Artery Disease    History of Present Illness: Bethany Wang is a 51 y.o. female last seen in May. She presents for a routine follow-up visit. She states that she has been under a tremendous amount of psychosocial stress, having marital problems. In this setting she has been having recurrent angina but has been using nitroglycerin with improvement. She reports compliance with her cardiac regimen.  I personally reviewed her ECG today which shows normal sinus rhythm, no acute ST segment changes. I went over the results with her today.  Cardiac catheterization from earlier in the year is outlined below. Plan is still continue with medical therapy at this time.  Past Medical History:  Diagnosis Date  . Anemia   . Bilateral breast cysts 12/30/2015  . Breast cancer, right breast ([place])   . BV (bacterial vaginosis) 09/09/2015  . Coronary atherosclerosis of native coronary artery    DES to RCA 03/2012, DES to LAD at [facility] 05/2012  . Cyst of pharynx or nasopharynx    [name]  . Essential hypertension   . Falls    x 3-4 in past year  . GERD (gastroesophageal reflux disease)   . History of breast cancer 09/09/2015  . History of hematuria   . Mixed hyperlipidemia   . PUD (peptic ulcer disease)   . Seizures ([place])   . Shingles 09/09/2015  . Suicide attempt ([place])    3 attempts in remote past  . Type 2 diabetes mellitus ([place])   . Uterine cancer [facility])     Past Surgical History:  Procedure Laterality Date  . ABDOMINAL HYSTERECTOMY    . BLADDER SURGERY    . CARDIAC CATHETERIZATION N/A 05/10/2016   Procedure: Left Heart Cath and Coronary Angiography;  Surgeon: Belva Crome, MD;  Location: [place] [facility];  Service: Cardiovascular;  Laterality:  N/A;  . COLONOSCOPY  May 2010   [place]: normal rectum, internal hemorrhoids, , benign colonic polyp  . ESOPHAGOGASTRODUODENOSCOPY     2001 Dr. Amedeo Plenty: distal esophagitis, small hiatal hernia,. Dr. Tamala Julian 2006? no records available currently, pt also reports  EGD a few years ago with Dr. Gala Romney, do not have these reports anywhere in medical records  . ESOPHAGOGASTRODUODENOSCOPY  06/02/2011   ION:GEXBMW pill impaction as described above s/p dilation of a probable cervical esophageal web/bx abnormal esophageal and gastric mucosa. + H.pylori gastritis   . EYE SURGERY     Removed glass  . HAND SURGERY Right   . Left breast lumpectomy     Benign  . LEFT HEART CATHETERIZATION WITH CORONARY ANGIOGRAM N/A 07/03/2014   Procedure: LEFT HEART CATHETERIZATION WITH CORONARY ANGIOGRAM;  Surgeon: Belva Crome, MD;  Location: [facility] CATH LAB;  Service: Cardiovascular;  Laterality: N/A;  . SHOULDER SURGERY Left     Current Outpatient Prescriptions  Medication Sig Dispense Refill  . aspirin EC 81 MG tablet Take 81 mg by mouth daily.    [name] atorvastatin (LIPITOR) 40 MG tablet Take 1 tablet (40 mg total) by mouth daily at 6 PM. 30 tablet 0  . cyclobenzaprine (FLEXERIL) 10 MG tablet Take 1 tablet (10 mg total) by mouth 3 (three) times daily. 20 tablet 0  . EPINEPHrine 0.3 mg/0.3 mL IJ SOAJ injection Inject 0.3  mg into the muscle as needed (allergic reaction).     [name] escitalopram (LEXAPRO) 20 MG tablet Take 20 mg by mouth daily.     [name] ibuprofen (ADVIL,MOTRIN) 200 MG tablet Take 400 mg by mouth at bedtime as needed for moderate pain.    . isosorbide mononitrate (IMDUR) 30 MG 24 hr tablet Take 3 tablets (90 mg total) by mouth daily. 90 tablet 6  . levofloxacin (LEVAQUIN) 750 MG tablet Take 1 tablet (750 mg total) by mouth daily. X 7 days 5 tablet 0  . lisinopril (PRINIVIL,ZESTRIL) 10 MG tablet Take 1 tablet (10 mg total) by mouth daily. 30 tablet 0  . Metoprolol Tartrate 37.5 MG TABS Take 37.5 mg by mouth 2 (two) times  daily. 60 tablet 0  . naproxen sodium (ANAPROX) 220 MG tablet Take 440 mg by mouth at bedtime as needed (pain).    . nitroGLYCERIN (NITROSTAT) 0.4 MG SL tablet Place 1 tablet (0.4 mg total) under the tongue every 5 (five) minutes x 3 doses as needed for chest pain. If no relief after 3rd dose, proceed to the ED for an evaluation 25 tablet 3  . pantoprazole (PROTONIX) 40 MG tablet Take 40 mg by mouth daily.    . potassium chloride SA (K-DUR,KLOR-CON) 20 MEQ tablet Take 1 tablet (20 mEq total) by mouth daily. 30 tablet 0  . traMADol (ULTRAM) 50 MG tablet 1 or 2 po q6h prn pain 20 tablet 0   No current facility-administered medications for this visit.    Allergies:  Penicillins   Social History: The patient  reports that she quit smoking about 4 years ago. Her smoking use included Cigarettes. She started smoking about 33 years ago. She has a 6.40 pack-year smoking history. She has never used smokeless tobacco. She reports that she does not drink alcohol or use drugs.   ROS:  Please see the history of present illness. Otherwise, complete review of systems is positive for leg cramps.  All other systems are reviewed and negative.   Physical Exam: VS:  BP (!) 176/90   Pulse 90   Ht 5\' 4"  (1.626 m)   Wt 153 lb (69.4 kg)   SpO2 98%   BMI 26.26 kg/m , BMI Body mass index is 26.26 kg/m.  Wt Readings from Last 3 Encounters:  12/31/16 153 lb (69.4 kg)  12/24/16 154 lb (69.9 kg)  11/11/16 157 lb (71.2 kg)    General: Patient appears comfortable at rest. HEENT: Conjunctiva and lids normal, oropharynx clear. Neck: Supple, no elevated JVP or carotid bruits, no thyromegaly. Lungs: Clear to auscultation, nonlabored breathing at rest. Cardiac: Regular rate and rhythm, no S3 or significant systolic murmur, no pericardial rub. Abdomen: Soft, nontender, bowel sounds present. Extremities: No pitting edema, distal pulses 2+. Skin: Warm and dry. Musculoskeletal: No kyphosis. Neuropsychiatric: Alert and  oriented x3, affect grossly appropriate.  ECG: I personally reviewed the tracing from 06/28/2016 which showed sinus rhythm with increased voltage and nonspecific ST changes, likely repolarization abnormalities.  Recent Labwork: 06/28/2016: ALT 19; AST 20; BUN 10; Creatinine, Ser 0.95; Hemoglobin 14.3; Platelets 236; Potassium 3.0; Sodium 134     Component Value Date/Time   CHOL 186 05/21/2013 1155   TRIG 115 05/21/2013 1155   HDL 40 05/21/2013 1155   CHOLHDL 4.7 05/21/2013 1155   VLDL 23 05/21/2013 1155   LDLCALC 123 (H) 05/21/2013 1155    Other Studies Reviewed Today:  Cardiac catheterization 05/10/2016:  Widely patent LAD stent. Ostial 75% second diagonal jailed  by LAD stent.  Totally occluded right coronary within the previously placed stent. The right fills by left to right collaterals.  Progression of first obtuse marginal from 30% to 75-80%. The vessel caliber is in the 1.5-2 mm range and is probably best treated with medical therapy.  Widely patent left main  Normal left ventricular function.EF estimated to be 60%. EDP is 14.  Assessment and Plan:  1. Multivessel CAD with cardiac catheterization earlier this year demonstrating widely patent LAD stent with jailed second diagonal at 75%, small caliber first obtuse marginal with 75% stenosis, and occluded RCA at previous stent site associated with left to right collaterals. The plan is to continue medical therapy for antianginal control at this point. She reports compliance with her medications.  2. Mixed hyperlipidemia, continues on Lipitor. She is following with Dr. [name].  3. Essential hypertension, blood pressure elevated today, she was upset discussing her recent life stressors. Reports compliance with her medications.  4. Tobacco abuse in remission.  Current medicines were reviewed with the patient today.   Orders Placed This Encounter  Procedures  . EKG 12-Lead    Disposition: Follow-up in 4  months.  Signed, Satira Sark, MD, [facility] 12/31/2016 1:47 PM    [facility] at [facility] [address] [place], [place] [address] Phone: [phone]; Fax: [phone]

## 2017-01-04 ENCOUNTER — Ambulatory Visit: Payer: Medicare Other | Admitting: Cardiology

## 2017-01-04 MED ORDER — MIDAZOLAM HCL 2 MG/2ML IJ SOLN
INTRAMUSCULAR | Status: AC
  Filled 2017-01-04: qty 2

## 2017-01-04 MED ORDER — FENTANYL CITRATE (PF) 100 MCG/2ML IJ SOLN
INTRAMUSCULAR | Status: AC
  Filled 2017-01-04: qty 2

## 2017-01-05 ENCOUNTER — Encounter (HOSPITAL_COMMUNITY): Payer: Self-pay | Admitting: *Deleted

## 2017-01-05 ENCOUNTER — Ambulatory Visit (HOSPITAL_COMMUNITY)
Admission: RE | Admit: 2017-01-05 | Discharge: 2017-01-05 | Disposition: A | Discharge status: Home / Self Care | Payer: Medicare Other | Source: Ambulatory Visit | Attending: Internal Medicine | Admitting: Internal Medicine

## 2017-01-05 ENCOUNTER — Encounter (HOSPITAL_COMMUNITY)
Admission: RE | Disposition: A | Discharge status: Home / Self Care | Payer: Self-pay | Source: Ambulatory Visit | Attending: Internal Medicine

## 2017-01-05 DIAGNOSIS — E119 Type 2 diabetes mellitus without complications: Secondary | ICD-10-CM | POA: Insufficient documentation

## 2017-01-05 DIAGNOSIS — E782 Mixed hyperlipidemia: Secondary | ICD-10-CM | POA: Insufficient documentation

## 2017-01-05 DIAGNOSIS — K6289 Other specified diseases of anus and rectum: Secondary | ICD-10-CM | POA: Insufficient documentation

## 2017-01-05 DIAGNOSIS — D125 Benign neoplasm of sigmoid colon: Secondary | ICD-10-CM | POA: Diagnosis not present

## 2017-01-05 DIAGNOSIS — Z79899 Other long term (current) drug therapy: Secondary | ICD-10-CM | POA: Insufficient documentation

## 2017-01-05 DIAGNOSIS — Q439 Congenital malformation of intestine, unspecified: Secondary | ICD-10-CM | POA: Diagnosis not present

## 2017-01-05 DIAGNOSIS — Z791 Long term (current) use of non-steroidal anti-inflammatories (NSAID): Secondary | ICD-10-CM | POA: Diagnosis not present

## 2017-01-05 DIAGNOSIS — K625 Hemorrhage of anus and rectum: Secondary | ICD-10-CM | POA: Diagnosis not present

## 2017-01-05 DIAGNOSIS — Z7982 Long term (current) use of aspirin: Secondary | ICD-10-CM | POA: Diagnosis not present

## 2017-01-05 DIAGNOSIS — Z87891 Personal history of nicotine dependence: Secondary | ICD-10-CM | POA: Diagnosis not present

## 2017-01-05 DIAGNOSIS — Z8711 Personal history of peptic ulcer disease: Secondary | ICD-10-CM | POA: Diagnosis not present

## 2017-01-05 DIAGNOSIS — D122 Benign neoplasm of ascending colon: Secondary | ICD-10-CM | POA: Insufficient documentation

## 2017-01-05 DIAGNOSIS — K644 Residual hemorrhoidal skin tags: Secondary | ICD-10-CM | POA: Diagnosis not present

## 2017-01-05 DIAGNOSIS — I10 Essential (primary) hypertension: Secondary | ICD-10-CM | POA: Insufficient documentation

## 2017-01-05 DIAGNOSIS — Z853 Personal history of malignant neoplasm of breast: Secondary | ICD-10-CM | POA: Diagnosis not present

## 2017-01-05 DIAGNOSIS — I2510 Atherosclerotic heart disease of native coronary artery without angina pectoris: Secondary | ICD-10-CM | POA: Insufficient documentation

## 2017-01-05 DIAGNOSIS — D123 Benign neoplasm of transverse colon: Secondary | ICD-10-CM

## 2017-01-05 DIAGNOSIS — K635 Polyp of colon: Secondary | ICD-10-CM | POA: Insufficient documentation

## 2017-01-05 DIAGNOSIS — Z8542 Personal history of malignant neoplasm of other parts of uterus: Secondary | ICD-10-CM | POA: Diagnosis not present

## 2017-01-05 HISTORY — PX: COLONOSCOPY: SHX5424

## 2017-01-05 HISTORY — DX: Malignant neoplasm of unspecified site of unspecified female breast: C50.919

## 2017-01-05 HISTORY — PX: POLYPECTOMY: SHX5525

## 2017-01-05 LAB — GLUCOSE, CAPILLARY: GLUCOSE-CAPILLARY: 68 mg/dL (ref 65–99)

## 2017-01-05 SURGERY — COLONOSCOPY
Anesthesia: Moderate Sedation

## 2017-01-05 MED ORDER — MIDAZOLAM HCL 5 MG/5ML IJ SOLN
INTRAMUSCULAR | Status: DC | PRN
  Administered 2017-01-05 (×3): 2 mg via INTRAVENOUS
  Administered 2017-01-05: 1 mg via INTRAVENOUS

## 2017-01-05 MED ORDER — STERILE WATER FOR IRRIGATION IR SOLN
Status: DC | PRN
  Administered 2017-01-05: 13:00:00

## 2017-01-05 MED ORDER — SODIUM CHLORIDE 0.9% FLUSH
INTRAVENOUS | Status: AC
  Filled 2017-01-05: qty 10

## 2017-01-05 MED ORDER — MIDAZOLAM HCL 5 MG/5ML IJ SOLN
INTRAMUSCULAR | Status: AC
  Filled 2017-01-05: qty 10

## 2017-01-05 MED ORDER — SODIUM CHLORIDE 0.9 % IV SOLN
INTRAVENOUS | Status: DC
  Administered 2017-01-05: 12:00:00 via INTRAVENOUS

## 2017-01-05 MED ORDER — MEPERIDINE HCL 50 MG/ML IJ SOLN
INTRAMUSCULAR | Status: DC | PRN
  Administered 2017-01-05 (×2): 25 mg via INTRAVENOUS

## 2017-01-05 MED ORDER — MEPERIDINE HCL 50 MG/ML IJ SOLN
INTRAMUSCULAR | Status: AC
  Filled 2017-01-05: qty 1

## 2017-01-05 NOTE — Discharge Instructions (Signed)
No aspirin for 3 days. Resume other medications and diet as before. No driving for 24 hours. Physician will call with biopsy results.      Colonoscopy, Adult, Care After This sheet gives you information about how to care for yourself after your procedure. Your doctor may also give you more specific instructions. If you have problems or questions, call your doctor. Follow these instructions at home: General instructions   For the first 24 hours after the procedure: ? Do not drive or use machinery. ? Do not sign important documents. ? Do not drink alcohol. ? Do your daily activities more slowly than normal. ? Eat foods that are soft and easy to digest. ? Rest often.  Take over-the-counter or prescription medicines only as told by your doctor.  It is up to you to get the results of your procedure. Ask your doctor, or the department performing the procedure, when your results will be ready. To help cramping and bloating:  Try walking around.  Put heat on your belly (abdomen) as told by your doctor. Use a heat source that your doctor recommends, such as a moist heat pack or a heating pad. ? Put a towel between your skin and the heat source. ? Leave the heat on for 20-30 minutes. ? Remove the heat if your skin turns bright red. This is especially important if you cannot feel pain, heat, or cold. You can get burned. Eating and drinking  Drink enough fluid to keep your pee (urine) clear or pale yellow.  Return to your normal diet as told by your doctor. Avoid heavy or fried foods that are hard to digest.  Avoid drinking alcohol for as long as told by your doctor. Contact a doctor if:  You have blood in your poop (stool) 2-3 days after the procedure. Get help right away if:  You have more than a small amount of blood in your poop.  You see large clumps of tissue (blood clots) in your poop.  Your belly is swollen.  You feel sick to your stomach (nauseous).  You throw up  (vomit).  You have a fever.  You have belly pain that gets worse, and medicine does not help your pain. This information is not intended to replace advice given to you by your health care provider. Make sure you discuss any questions you have with your health care provider. Document Released: 05/01/2010 Document Revised: 12/22/2015 Document Reviewed: 12/22/2015 Elsevier Interactive Patient Education  2017 [employer].    Colon Polyps Polyps are tissue growths inside the body. Polyps can grow in many places, including the large intestine (colon). A polyp may be a round bump or a mushroom-shaped growth. You could have one polyp or several. Most colon polyps are noncancerous (benign). However, some colon polyps can become cancerous over time. What are the causes? The exact cause of colon polyps is not known. What increases the risk? This condition is more likely to develop in people who:  Have a family history of colon cancer or colon polyps.  Are older than 28 or older than 45 if they are African American.  Have inflammatory bowel disease, such as ulcerative colitis or Crohn disease.  Are overweight.  Smoke cigarettes.  Do not get enough exercise.  Drink too much alcohol.  Eat a diet that is: ? High in fat and red meat. ? Low in fiber.  Had childhood cancer that was treated with abdominal radiation.  What are the signs or symptoms? Most polyps do  not cause symptoms. If you have symptoms, they may include:  Blood coming from your rectum when having a bowel movement.  Blood in your stool.The stool may look dark red or black.  A change in bowel habits, such as constipation or diarrhea.  How is this diagnosed? This condition is diagnosed with a colonoscopy. This is a procedure that uses a lighted, flexible scope to look at the inside of your colon. How is this treated? Treatment for this condition involves removing any polyps that are found. Those polyps will then be  tested for cancer. If cancer is found, your health care provider will talk to you about options for colon cancer treatment. Follow these instructions at home: Diet  Eat plenty of fiber, such as fruits, vegetables, and whole grains.  Eat foods that are high in calcium and vitamin D, such as milk, cheese, yogurt, eggs, liver, fish, and broccoli.  Limit foods high in fat, red meats, and processed meats, such as hot dogs, sausage, bacon, and lunch meats.  Maintain a healthy weight, or lose weight if recommended by your health care provider. General instructions  Do not smoke cigarettes.  Do not drink alcohol excessively.  Keep all follow-up visits as told by your health care provider. This is important. This includes keeping regularly scheduled colonoscopies. Talk to your health care provider about when you need a colonoscopy.  Exercise every day or as told by your health care provider. Contact a health care provider if:  You have new or worsening bleeding during a bowel movement.  You have new or increased blood in your stool.  You have a change in bowel habits.  You unexpectedly lose weight. This information is not intended to replace advice given to you by your health care provider. Make sure you discuss any questions you have with your health care provider. Document Released: 12/24/2003 Document Revised: 09/04/2015 Document Reviewed: 02/17/2015 Elsevier Interactive Patient Education  2018 [employer].   Hemorrhoids Hemorrhoids are swollen veins in and around the rectum or anus. There are two types of hemorrhoids:  Internal hemorrhoids. These occur in the veins that are just inside the rectum. They may poke through to the outside and become irritated and painful.  External hemorrhoids. These occur in the veins that are outside of the anus and can be felt as a painful swelling or hard lump near the anus.  Most hemorrhoids do not cause serious problems, and they can be managed  with home treatments such as diet and lifestyle changes. If home treatments do not help your symptoms, procedures can be done to shrink or remove the hemorrhoids. What are the causes? This condition is caused by increased pressure in the anal area. This pressure may result from various things, including:  Constipation.  Straining to have a bowel movement.  Diarrhea.  Pregnancy.  Obesity.  Sitting for long periods of time.  Heavy lifting or other activity that causes you to strain.  Anal sex.  What are the signs or symptoms? Symptoms of this condition include:  Pain.  Anal itching or irritation.  Rectal bleeding.  Leakage of stool (feces).  Anal swelling.  One or more lumps around the anus.  How is this diagnosed? This condition can often be diagnosed through a visual exam. Other exams or tests may also be done, such as:  Examination of the rectal area with a gloved hand (digital rectal exam).  Examination of the anal canal using a small tube (anoscope).  A blood test, if  you have lost a significant amount of blood.  A test to look inside the colon (sigmoidoscopy or colonoscopy).  How is this treated? This condition can usually be treated at home. However, various procedures may be done if dietary changes, lifestyle changes, and other home treatments do not help your symptoms. These procedures can help make the hemorrhoids smaller or remove them completely. Some of these procedures involve surgery, and others do not. Common procedures include:  Rubber band ligation. Rubber bands are placed at the base of the hemorrhoids to cut off the blood supply to them.  Sclerotherapy. Medicine is injected into the hemorrhoids to shrink them.  Infrared coagulation. A type of light energy is used to get rid of the hemorrhoids.  Hemorrhoidectomy surgery. The hemorrhoids are surgically removed, and the veins that supply them are tied off.  Stapled hemorrhoidopexy surgery. A  circular stapling device is used to remove the hemorrhoids and use staples to cut off the blood supply to them.  Follow these instructions at home: Eating and drinking  Eat foods that have a lot of fiber in them, such as whole grains, beans, nuts, fruits, and vegetables. Ask your health care provider about taking products that have added fiber (fiber supplements).  Drink enough fluid to keep your urine clear or pale yellow. Managing pain and swelling  Take warm sitz baths for 20 minutes, 3-4 times a day to ease pain and discomfort.  If directed, apply ice to the affected area. Using ice packs between sitz baths may be helpful. ? Put ice in a plastic bag. ? Place a towel between your skin and the bag. ? Leave the ice on for 20 minutes, 2-3 times a day. General instructions  Take over-the-counter and prescription medicines only as told by your health care provider.  Use medicated creams or suppositories as told.  Exercise regularly.  Go to the bathroom when you have the urge to have a bowel movement. Do not wait.  Avoid straining to have bowel movements.  Keep the anal area dry and clean. Use wet toilet paper or moist towelettes after a bowel movement.  Do not sit on the toilet for long periods of time. This increases blood pooling and pain. Contact a health care provider if:  You have increasing pain and swelling that are not controlled by treatment or medicine.  You have uncontrolled bleeding.  You have difficulty having a bowel movement, or you are unable to have a bowel movement.  You have pain or inflammation outside the area of the hemorrhoids. This information is not intended to replace advice given to you by your health care provider. Make sure you discuss any questions you have with your health care provider. Document Released: 03/26/2000 Document Revised: 08/27/2015 Document Reviewed: 12/11/2014 Elsevier Interactive Patient Education  2017 [employer].

## 2017-01-05 NOTE — H&P (Signed)
Bethany Wang is an 51 y.o. female.   Chief Complaint: patient is here for colonoscopy. HPI: patient is 51 year old African-American female who is here for diagnostic colonoscopy. Three months ago she had painless hematochezia. Bleeding continued for 3 days. She did not experience nausea vomiting diarrhea or rectal bleeding.last colonoscopy was in May 2010 with removal of small polyp and was not an adenoma. She also complains of loss of appetite. She states she has lost 35 pounds this year. However she denies nausea vomiting heartburn or dysphagia. Family history is negative for CRC.  Past Medical History:  Diagnosis Date  . Anemia   . Bilateral breast cysts 12/30/2015  . Breast cancer ([place])   . BV (bacterial vaginosis) 09/09/2015  . Coronary atherosclerosis of native coronary artery    DES to RCA 03/2012, DES to LAD at [facility] 05/2012  . Cyst of pharynx or nasopharynx    [name]  . Essential hypertension   . Falls    x 3-4 in past year  . GERD (gastroesophageal reflux disease)   . History of breast cancer 09/09/2015  . History of hematuria   . Mixed hyperlipidemia   . PUD (peptic ulcer disease)   . Seizures ([place])   . Shingles 09/09/2015  . Suicide attempt ([place])    3 attempts in remote past  . Type 2 diabetes mellitus ([place])   . Uterine cancer [facility])     Past Surgical History:  Procedure Laterality Date  . ABDOMINAL HYSTERECTOMY    . BLADDER SURGERY    . CARDIAC CATHETERIZATION N/A 05/10/2016   Procedure: Left Heart Cath and Coronary Angiography;  Surgeon: Belva Crome, MD;  Location: [facility];  Service: Cardiovascular;  Laterality: N/A;  . COLONOSCOPY  May 2010   Fleishman: normal rectum, internal hemorrhoids, , benign colonic polyp  . ESOPHAGOGASTRODUODENOSCOPY     2001 Dr. Amedeo Plenty: distal esophagitis, small hiatal hernia,. Dr. Tamala Julian 2006? no records available currently, pt also reports  EGD a few years ago with Dr. Gala Romney, do not have these reports  anywhere in medical records  . ESOPHAGOGASTRODUODENOSCOPY  06/02/2011   LKG:[id number] pill impaction as described above s/p dilation of a probable cervical esophageal web/bx abnormal esophageal and gastric mucosa. + H.pylori gastritis   . EYE SURGERY     Removed glass  . HAND SURGERY Right   . Left breast lumpectomy     Benign  . LEFT HEART CATHETERIZATION WITH CORONARY ANGIOGRAM N/A 07/03/2014   Procedure: LEFT HEART CATHETERIZATION WITH CORONARY ANGIOGRAM;  Surgeon: Belva Crome, MD;  Location: [facility] CATH LAB;  Service: Cardiovascular;  Laterality: N/A;  . SHOULDER SURGERY Left     Family History  Problem Relation Age of Onset  . Colon cancer Paternal Grandfather   . Cancer Father   . Cancer Maternal Uncle   . Alzheimer's disease Paternal Aunt   . Cirrhosis Maternal Uncle   . Cancer Paternal Aunt        lung  . Heart disease Brother   . Heart attack Brother   . Mental illness Daughter   . ADD / ADHD Daughter   . Bipolar disorder Daughter   . Mental illness Son   . ADD / ADHD Son   . Bipolar disorder Son   . Mental illness Son   . ADD / ADHD Son   . Bipolar disorder Son    Social History:  reports that she quit smoking about 4 years ago. Her smoking use included Cigarettes.  She started smoking about 33 years ago. She has a 6.40 pack-year smoking history. She has never used smokeless tobacco. She reports that she does not drink alcohol or use drugs.  Allergies:  Allergies  Allergen Reactions  . Penicillins Anaphylaxis    Has patient had a PCN reaction causing immediate rash, facial/tongue/throat swelling, SOB or lightheadedness with hypotension: Yes Has patient had a PCN reaction causing severe rash involving mucus membranes or skin necrosis: Yes Has patient had a PCN reaction that required hospitalization Yes Has patient had a PCN reaction occurring within the last 10 years: Yes If all of the above answers are "NO", then may proceed with Cephalosporin use. Patient states she  carries an epi-pen    Medications Prior to Admission  Medication Sig Dispense Refill  . aspirin EC 81 MG tablet Take 81 mg by mouth daily.    [name] atorvastatin (LIPITOR) 40 MG tablet Take 1 tablet (40 mg total) by mouth daily at 6 PM. 30 tablet 0  . EPINEPHrine 0.3 mg/0.3 mL IJ SOAJ injection Inject 0.3 mg into the muscle as needed (allergic reaction).     [name] escitalopram (LEXAPRO) 20 MG tablet Take 20 mg by mouth daily.     [name] ibuprofen (ADVIL,MOTRIN) 200 MG tablet Take 400 mg by mouth at bedtime as needed for moderate pain.    . isosorbide mononitrate (IMDUR) 30 MG 24 hr tablet Take 3 tablets (90 mg total) by mouth daily. 90 tablet 6  . lisinopril (PRINIVIL,ZESTRIL) 10 MG tablet Take 1 tablet (10 mg total) by mouth daily. 30 tablet 0  . metoprolol tartrate (LOPRESSOR) 25 MG tablet Take 37.5 mg by mouth 2 (two) times daily.    . Multiple Vitamin (MULTIVITAMIN WITH MINERALS) TABS tablet Take 1 tablet by mouth daily.    . naproxen sodium (ANAPROX) 220 MG tablet Take 440 mg by mouth at bedtime as needed (pain).    . nitroGLYCERIN (NITROSTAT) 0.4 MG SL tablet Place 1 tablet (0.4 mg total) under the tongue every 5 (five) minutes x 3 doses as needed for chest pain. If no relief after 3rd dose, proceed to the ED for an evaluation 25 tablet 3  . pantoprazole (PROTONIX) 40 MG tablet Take 40 mg by mouth daily.    . potassium chloride SA (K-DUR,KLOR-CON) 20 MEQ tablet Take 1 tablet (20 mEq total) by mouth daily. 30 tablet 0  . cyclobenzaprine (FLEXERIL) 10 MG tablet Take 1 tablet (10 mg total) by mouth 3 (three) times daily. (Patient not taking: Reported on 01/03/2017) 20 tablet 0  . levofloxacin (LEVAQUIN) 750 MG tablet Take 1 tablet (750 mg total) by mouth daily. X 7 days (Patient not taking: Reported on 01/03/2017) 5 tablet 0  . traMADol (ULTRAM) 50 MG tablet 1 or 2 po q6h prn pain (Patient not taking: Reported on 01/03/2017) 20 tablet 0    Results for orders placed or performed during the hospital encounter  of 01/05/17 (from the past 48 hour(s))  Glucose, capillary     Status: None   Collection Time: 01/05/17 12:30 PM  Result Value Ref Range   Glucose-Capillary 68 65 - 99 mg/dL   No results found.  ROS  Blood pressure (!) 172/93, pulse 65, temperature 98.5 F (36.9 C), temperature source Oral, resp. rate 15, height 5\' 4"  (1.626 m), weight 153 lb (69.4 kg), SpO2 97 %. Physical Exam  Constitutional: She appears well-developed and well-nourished.  HENT:  Mouth/Throat: Oropharynx is clear and moist.  Eyes: Conjunctivae are normal. No scleral  icterus.  Neck: No thyromegaly present.  Cardiovascular: Normal rate, regular rhythm and normal heart sounds.   No murmur heard. Respiratory: Effort normal and breath sounds normal.  GI: Soft. She exhibits no distension and no mass. There is no tenderness.  Pfannenstiel scar.  Musculoskeletal: She exhibits no edema.  Lymphadenopathy:    She has no cervical adenopathy.  Neurological: She is alert.  Skin: Skin is warm and dry.     Assessment/Plan Rectal bleeding. Diagnostic colonoscopy.  Hildred Laser, MD 01/05/2017, 1:15 PM

## 2017-01-05 NOTE — Op Note (Signed)
[facility] Patient Name: Bethany Wang Procedure Date: 01/05/2017 12:58 PM MRN: [id number] Date of Birth: 01/05/1966 Attending MD: Hildred Laser , MD CSN: [id number] Age: 51 Admit Type: Outpatient Procedure:                Colonoscopy Indications:              Rectal bleeding Providers:                Hildred Laser, MD, Jeanann Lewandowsky. Sharon Seller, RN, Janeece Riggers, RN Referring MD:             [name], MD Medicines:                Meperidine 50 mg IV, Midazolam 7 mg IV Complications:            No immediate complications. Estimated Blood Loss:     Estimated blood loss was minimal. Procedure:                Pre-Anesthesia Assessment:                           - Prior to the procedure, a History and Physical                            was performed, and patient medications and                            allergies were reviewed. The patient's tolerance of                            previous anesthesia was also reviewed. The risks                            and benefits of the procedure and the sedation                            options and risks were discussed with the patient.                            All questions were answered, and informed consent                            was obtained. Prior Anticoagulants: The patient                            last took aspirin 3 days prior to the procedure.                            ASA Grade Assessment: III - A patient with severe                            systemic disease. After reviewing the risks and  benefits, the patient was deemed in satisfactory                            condition to undergo the procedure.                           After obtaining informed consent, the colonoscope                            was passed under direct vision. Throughout the                            procedure, the patient's blood pressure, pulse, and                            oxygen  saturations were monitored continuously. The                            EC-[id number] ([id number]) was introduced through the                            anus and advanced to the the cecum, identified by                            appendiceal orifice and ileocecal valve. The                            colonoscopy was technically difficult and complex                            due to significant looping. Successful completion                            of the procedure was aided by increasing the dose                            of sedation medication, changing the patient's                            position, using manual pressure and withdrawing and                            reinserting the scope. The patient tolerated the                            procedure well. The quality of the bowel                            preparation was good. The ileocecal valve,                            appendiceal orifice, and rectum were photographed. Scope In: 1:30:51 PM Scope Out: 2:04:54 PM Scope Withdrawal Time: 0 hours 11 minutes 40 seconds  Total Procedure Duration: 0  hours 34 minutes 3 seconds  Findings:      The perianal and digital rectal examinations were normal.      Six sessile polyps were found in the sigmoid colon, splenic flexure and       ascending colon. The polyps were diminutive in size. These were biopsied       with a cold forceps for histology.      The exam was otherwise normal throughout the examined colon.      External hemorrhoids were found during retroflexion. The hemorrhoids       were small.      Anal papilla(e) were hypertrophied. Impression:               - Six diminutive polyps in the sigmoid colon, at                            the splenic flexure and in the ascending colon.                            Biopsied.                           - External hemorrhoids.                           - Anal papilla(e) were hypertrophied. Moderate Sedation:      Moderate (conscious)  sedation was administered by the endoscopy nurse       and supervised by the endoscopist. The following parameters were       monitored: oxygen saturation, heart rate, blood pressure, CO2       capnography and response to care. Total physician intraservice time was       41 minutes. Recommendation:           - Patient has a contact number available for                            emergencies. The signs and symptoms of potential                            delayed complications were discussed with the                            patient. Return to normal activities tomorrow.                            Written discharge instructions were provided to the                            patient.                           - Resume previous diet today.                           - Continue present medications.                           - Resume aspirin at prior  dose in 3 days.                           - Await pathology results.                           - Repeat colonoscopy date to be determined after                            pending pathology results are reviewed. Procedure Code(s):        --- Professional ---                           [phone], Colonoscopy, flexible; with biopsy, single                            or multiple                           99152, Moderate sedation services provided by the                            same physician or other qualified health care                            professional performing the diagnostic or                            therapeutic service that the sedation supports,                            requiring the presence of an independent trained                            observer to assist in the monitoring of the                            patient's level of consciousness and physiological                            status; initial 15 minutes of intraservice time,                            patient age 87 years or older                           [phone],  Moderate sedation services; each additional                            15 minutes intraservice time                           99153, Moderate sedation services; each additional                            15 minutes intraservice  time Diagnosis Code(s):        --- Professional ---                           D12.5, Benign neoplasm of sigmoid colon                           D12.3, Benign neoplasm of transverse colon (hepatic                            flexure or splenic flexure)                           D12.2, Benign neoplasm of ascending colon                           K62.89, Other specified diseases of anus and rectum                           K64.4, Residual hemorrhoidal skin tags                           K62.5, Hemorrhage of anus and rectum CPT copyright 2016 American Medical Association. All rights reserved. The codes documented in this report are preliminary and upon coder review may  be revised to meet current compliance requirements. Hildred Laser, MD Hildred Laser, MD 01/05/2017 2:10:02 PM This report has been signed electronically. Number of Addenda: 0

## 2017-01-12 ENCOUNTER — Encounter (HOSPITAL_COMMUNITY): Payer: Self-pay | Admitting: Internal Medicine

## 2017-02-17 ENCOUNTER — Encounter: Payer: Self-pay | Admitting: Physical Medicine & Rehabilitation

## 2017-03-14 ENCOUNTER — Telehealth: Payer: Self-pay | Admitting: Physical Medicine & Rehabilitation

## 2017-03-14 ENCOUNTER — Encounter: Payer: Self-pay | Admitting: Physical Medicine & Rehabilitation

## 2017-03-14 ENCOUNTER — Ambulatory Visit: Payer: Medicare Other | Admitting: Physical Medicine & Rehabilitation

## 2017-03-14 ENCOUNTER — Encounter: Payer: Medicare Other | Attending: Physical Medicine & Rehabilitation

## 2017-03-14 VITALS — BP 154/106

## 2017-03-14 DIAGNOSIS — G8929 Other chronic pain: Secondary | ICD-10-CM

## 2017-03-14 DIAGNOSIS — M545 Low back pain: Principal | ICD-10-CM

## 2017-03-14 NOTE — Telephone Encounter (Signed)
Patient walked out at 3:05 as Dr. Council Mechanic into room for treatment.  She advised doctor she had been waiting 45 minutes - based on check in time of CMA and physician walking into room - wait time had been 30 minutes in room. She advised front desk she would not be back to practice or physician

## 2017-03-14 NOTE — Progress Notes (Signed)
Subjective:    Patient ID: Bethany Wang, female    DOB: Aug 18, 1965, 51 y.o.   MRN: [id number]  HPI Patient was roomed at 42, MD entered the room at 305, patient complained that she was waiting 45 minutes and did not want to see MD.  MD offered his apology, patient exited room. Pain Inventory Average Pain 9 Pain Right Now 9 My pain is sharp, dull, stabbing and aching  In the last 24 hours, has pain interfered with the following? General activity 7 Relation with others 7 Enjoyment of life 7 What TIME of day is your pain at its worst? evening Sleep (in general) Poor  Pain is worse with: walking, bending, sitting and standing Pain improves with: injections Relief from Meds: 0  Mobility use a cane do you drive?  no  Function not employed: date last employed 2000  Neuro/Psych weakness numbness tingling trouble walking spasms  Prior Studies Any changes since last visit?  no  Physicians involved in your care Any changes since last visit?  no   Family History  Problem Relation Age of Onset  . Colon cancer Paternal Grandfather   . Cancer Father   . Cancer Maternal Uncle   . Alzheimer's disease Paternal Aunt   . Cirrhosis Maternal Uncle   . Cancer Paternal Aunt        lung  . Heart disease Brother   . Heart attack Brother   . Mental illness Daughter   . ADD / ADHD Daughter   . Bipolar disorder Daughter   . Mental illness Son   . ADD / ADHD Son   . Bipolar disorder Son   . Mental illness Son   . ADD / ADHD Son   . Bipolar disorder Son    Social History   Socioeconomic History  . Marital status: Single    Spouse name: Not on file  . Number of children: 3  . Years of education: Assoc  . Highest education level: Not on file  Social Needs  . Financial resource strain: Not on file  . Food insecurity - worry: Not on file  . Food insecurity - inability: Not on file  . Transportation needs - medical: Not on file  . Transportation needs - non-medical:  Not on file  Occupational History  . Occupation: Retired    Comment: disability  Tobacco Use  . Smoking status: Former Smoker    Packs/day: 0.20    Years: 32.00    Pack years: 6.40    Types: Cigarettes    Start date: 07/05/1983    Last attempt to quit: 04/03/2012    Years since quitting: 4.9  . Smokeless tobacco: Never Used  Substance and Sexual Activity  . Alcohol use: No    Alcohol/week: 0.0 oz  . Drug use: No  . Sexual activity: Yes    Birth control/protection: Surgical    Comment: hyst  Other Topics Concern  . Not on file  Social History Narrative   Patient lives at home with her fiance.   Caffeine Use: 3 cups daily   Past Surgical History:  Procedure Laterality Date  . ABDOMINAL HYSTERECTOMY    . BLADDER SURGERY    . CARDIAC CATHETERIZATION N/A 05/10/2016   Procedure: Left Heart Cath and Coronary Angiography;  Surgeon: Belva Crome, MD;  Location: [place] [facility];  Service: Cardiovascular;  Laterality: N/A;  . COLONOSCOPY  May 2010   Fleishman: normal rectum, internal hemorrhoids, , benign colonic polyp  .  COLONOSCOPY N/A 01/05/2017   Procedure: COLONOSCOPY;  Surgeon: Rogene Houston, MD;  Location: [facility];  Service: Endoscopy;  Laterality: N/A;  1:00  . ESOPHAGOGASTRODUODENOSCOPY     2001 Dr. Amedeo Plenty: distal esophagitis, small hiatal hernia,. Dr. Tamala Julian 2006? no records available currently, pt also reports  EGD a few years ago with Dr. Gala Romney, do not have these reports anywhere in medical records  . ESOPHAGOGASTRODUODENOSCOPY  06/02/2011   TFT:DDUKGU pill impaction as described above s/p dilation of a probable cervical esophageal web/bx abnormal esophageal and gastric mucosa. + H.pylori gastritis   . EYE SURGERY     Removed glass  . HAND SURGERY Right   . Left breast lumpectomy     Benign  . LEFT HEART CATHETERIZATION WITH CORONARY ANGIOGRAM N/A 07/03/2014   Procedure: LEFT HEART CATHETERIZATION WITH CORONARY ANGIOGRAM;  Surgeon: Belva Crome, MD;  Location:  [facility] CATH LAB;  Service: Cardiovascular;  Laterality: N/A;  . POLYPECTOMY  01/05/2017   Procedure: POLYPECTOMY;  Surgeon: Rogene Houston, MD;  Location: [facility];  Service: Endoscopy;;  colon  . SHOULDER SURGERY Left    Past Medical History:  Diagnosis Date  . Anemia   . Bilateral breast cysts 12/30/2015  . Breast cancer ([place])   . BV (bacterial vaginosis) 09/09/2015  . Coronary atherosclerosis of native coronary artery    DES to RCA 03/2012, DES to LAD at [facility] 05/2012  . Cyst of pharynx or nasopharynx    [name]  . Essential hypertension   . Falls    x 3-4 in past year  . GERD (gastroesophageal reflux disease)   . History of breast cancer 09/09/2015  . History of hematuria   . Mixed hyperlipidemia   . PUD (peptic ulcer disease)   . Seizures ([place])   . Shingles 09/09/2015  . Suicide attempt ([place])    3 attempts in remote past  . Type 2 diabetes mellitus ([place])   . Uterine cancer ([place])    There were no vitals taken for this visit.  Opioid Risk Score:   Fall Risk Score:  `1  Depression screen PHQ 2/9  No flowsheet data found.   Review of Systems  Constitutional: Positive for appetite change, diaphoresis and unexpected weight change.  HENT: Negative.   Eyes: Negative.   Respiratory: Negative.   Cardiovascular: Negative.   Gastrointestinal: Positive for nausea and vomiting.  Endocrine: Negative.   Genitourinary: Negative.   Musculoskeletal: Positive for joint swelling.  Skin: Negative.   Allergic/Immunologic: Negative.   Neurological: Negative.   Hematological: Negative.   Psychiatric/Behavioral: Negative.   All other systems reviewed and are negative.      Objective:   Physical Exam        Assessment & Plan:  Patient will follow up with PCP and seek referral to another provider

## 2017-04-14 ENCOUNTER — Encounter: Payer: Self-pay | Admitting: Cardiology

## 2017-04-29 DIAGNOSIS — E119 Type 2 diabetes mellitus without complications: Secondary | ICD-10-CM | POA: Diagnosis not present

## 2017-05-10 ENCOUNTER — Emergency Department (HOSPITAL_COMMUNITY)
Admission: EM | Admit: 2017-05-10 | Discharge: 2017-05-10 | Disposition: A | Discharge status: Home / Self Care | Payer: Medicare HMO | Attending: Emergency Medicine | Admitting: Emergency Medicine

## 2017-05-10 ENCOUNTER — Other Ambulatory Visit: Payer: Self-pay

## 2017-05-10 ENCOUNTER — Emergency Department (HOSPITAL_COMMUNITY): Payer: Medicare HMO

## 2017-05-10 ENCOUNTER — Encounter (HOSPITAL_COMMUNITY): Payer: Self-pay | Admitting: Emergency Medicine

## 2017-05-10 DIAGNOSIS — Z8542 Personal history of malignant neoplasm of other parts of uterus: Secondary | ICD-10-CM | POA: Diagnosis not present

## 2017-05-10 DIAGNOSIS — I10 Essential (primary) hypertension: Secondary | ICD-10-CM | POA: Insufficient documentation

## 2017-05-10 DIAGNOSIS — R203 Hyperesthesia: Secondary | ICD-10-CM | POA: Insufficient documentation

## 2017-05-10 DIAGNOSIS — R52 Pain, unspecified: Secondary | ICD-10-CM

## 2017-05-10 DIAGNOSIS — E119 Type 2 diabetes mellitus without complications: Secondary | ICD-10-CM | POA: Diagnosis not present

## 2017-05-10 DIAGNOSIS — Z79899 Other long term (current) drug therapy: Secondary | ICD-10-CM | POA: Diagnosis not present

## 2017-05-10 DIAGNOSIS — M79604 Pain in right leg: Secondary | ICD-10-CM | POA: Diagnosis not present

## 2017-05-10 DIAGNOSIS — Z853 Personal history of malignant neoplasm of breast: Secondary | ICD-10-CM | POA: Diagnosis not present

## 2017-05-10 DIAGNOSIS — R200 Anesthesia of skin: Secondary | ICD-10-CM | POA: Diagnosis present

## 2017-05-10 DIAGNOSIS — Z87891 Personal history of nicotine dependence: Secondary | ICD-10-CM | POA: Insufficient documentation

## 2017-05-10 DIAGNOSIS — Z7982 Long term (current) use of aspirin: Secondary | ICD-10-CM | POA: Insufficient documentation

## 2017-05-10 DIAGNOSIS — R600 Localized edema: Secondary | ICD-10-CM | POA: Diagnosis not present

## 2017-05-10 MED ORDER — METOPROLOL TARTRATE 25 MG PO TABS: 37.5000 mg | ORAL_TABLET | Freq: Two times a day (BID) | ORAL | 1 refills | Status: DC

## 2017-05-10 MED ORDER — PREDNISONE 20 MG PO TABS
40.0000 mg | ORAL_TABLET | Freq: Once | ORAL | Status: AC
Start: 2017-05-10 — End: 2017-05-10
  Administered 2017-05-10: 40 mg via ORAL
  Filled 2017-05-10: qty 2

## 2017-05-10 NOTE — Discharge Instructions (Signed)
Your ultrasound did not show any signs of blood clot Your blood pressure has been elevated, please take metoprolol as prescribed, I have given you a new prescription Please see your doctor within the next 48-72 hours for a repeat in her blood pressure check and a repeat exam of your leg If you develop a rash on your leg at the location where it hurts please seek medical attention immediately as that could be a sign of the shingles

## 2017-05-10 NOTE — ED Provider Notes (Signed)
[facility] EMERGENCY DEPARTMENT Provider Note   CSN: [id number] Arrival date & time: 05/10/17  1205     History   Chief Complaint Chief Complaint  Patient presents with  . Leg Pain    HPI Bethany Wang is a 52 y.o. female.  HPI  The patient is a 52 year old female, she has a history of bilateral numbness and below the waist after having spinal nerves severed surgically on purpose because of chronic pain from degenerative back disease, she reports having approximately 2 days of pain in her right lower extremity below the knee on the medial surface of the calf no objective swelling, no fevers or shortness of breath, she does have a history of DVT.  She reports that this pain is worse with touching the skin, it is not as bad when she tries to walk.  No chest pain shortness of breath fevers chills nausea vomiting or rashes to the skin.  It has become persistent, it is more of a burning sensation when touched, there is no rash in that location  Past Medical History:  Diagnosis Date  . Anemia   . Bilateral breast cysts 12/30/2015  . Breast cancer ([place])   . BV (bacterial vaginosis) 09/09/2015  . Coronary atherosclerosis of native coronary artery    DES to RCA 03/2012, DES to LAD at [facility] 05/2012  . Cyst of pharynx or nasopharynx    [name]  . Essential hypertension   . Falls    x 3-4 in past year  . GERD (gastroesophageal reflux disease)   . History of breast cancer 09/09/2015  . History of hematuria   . Mixed hyperlipidemia   . PUD (peptic ulcer disease)   . Seizures ([place])   . Shingles 09/09/2015  . Suicide attempt ([place])    3 attempts in remote past  . Type 2 diabetes mellitus ([place])   . Uterine cancer [facility])     Patient Active Problem List   Diagnosis Date Noted  . Hypertensive urgency 06/15/2016  . Unstable angina pectoris ([place]) 06/15/2016  . Unstable angina ([place]) 06/15/2016  . Bilateral breast cysts 12/30/2015  . Vaginal discharge 09/09/2015  . BV  (bacterial vaginosis) 09/09/2015  . Shingles 09/09/2015  . History of breast cancer 09/09/2015  . Syncope and collapse 08/08/2015  . Migraine with aura and with status migrainosus, not intractable 08/08/2015  . Chronic low back pain 08/08/2015  . Insomnia 08/08/2015  . Anxiety state 08/08/2015  . Panic attacks 08/08/2015  . Anemia, iron deficiency 08/08/2015  . Pica 08/08/2015  . Angina decubitus ([place]) 07/03/2014  . 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 Encounter for gynecological examination with Papanicolaou smear of cervix 02/04/2014  . Syncope 12/14/2013  . Cervical disc disorder with radiculopathy of cervical region 02/19/2013  . H/O rotator cuff surgery 02/19/2013  . Dyspareunia 02/01/2013  . Left shoulder pain 01/24/2013  . Rotator cuff tear 01/24/2013  . Radicular pain 01/24/2013  . Labral tear of shoulder 01/24/2013  . Complex regional pain syndrome of upper extremity 01/24/2013  . Herniated disc, cervical 01/24/2013  . Abdominal pain, other specified site 01/18/2013  . Nausea and vomiting 01/18/2013  . Rectal bleeding 01/18/2013  . Edema of left foot 10/16/2012  . Coronary atherosclerosis of native coronary artery   . Essential hypertension, benign   . HLD (hyperlipidemia)   . Tobacco abuse     Past Surgical History:  Procedure Laterality Date  . ABDOMINAL HYSTERECTOMY    . BLADDER SURGERY    .  CARDIAC CATHETERIZATION N/A 05/10/2016   Procedure: Left Heart Cath and Coronary Angiography;  Surgeon: Belva Crome, MD;  Location: [facility] CV LAB;  Service: Cardiovascular;  Laterality: N/A;  . COLONOSCOPY  May 2010   Fleishman: normal rectum, internal hemorrhoids, , benign colonic polyp  . COLONOSCOPY N/A 01/05/2017   Procedure: COLONOSCOPY;  Surgeon: Rogene Houston, MD;  [facility];  Service: Endoscopy;  Laterality: N/A;  1:00  . ESOPHAGOGASTRODUODENOSCOPY     2001 Dr. Amedeo Plenty: distal esophagitis, small hiatal hernia,. Dr. Tamala Julian 2006? no  records available currently, pt also reports  EGD a few years ago with Dr. Gala Romney, do not have these reports anywhere in medical records  . ESOPHAGOGASTRODUODENOSCOPY  06/02/2011   FUX:NATFTD pill impaction as described above s/p dilation of a probable cervical esophageal web/bx abnormal esophageal and gastric mucosa. + H.pylori gastritis   . EYE SURGERY     Removed glass  . HAND SURGERY Right   . Left breast lumpectomy     Benign  . LEFT HEART CATHETERIZATION WITH CORONARY ANGIOGRAM N/A 07/03/2014   Procedure: LEFT HEART CATHETERIZATION WITH CORONARY ANGIOGRAM;  Surgeon: Belva Crome, MD;  Location: [facility] CATH LAB;  Service: Cardiovascular;  Laterality: N/A;  . POLYPECTOMY  01/05/2017   Procedure: POLYPECTOMY;  Surgeon: Rogene Houston, MD;  [facility];  Service: Endoscopy;;  colon  . SHOULDER SURGERY Left     OB History    Gravida Para Term Preterm AB Living   4 3     1 3    SAB TAB Ectopic Multiple Live Births   1               Home Medications    Prior to Admission medications   Medication Sig Start Date End Date Taking? Authorizing Provider  aspirin EC 81 MG tablet Take 1 tablet (81 mg total) by mouth daily. 01/08/17   Rogene Houston, MD  atorvastatin (LIPITOR) 40 MG tablet Take 1 tablet (40 mg total) by mouth daily at 6 PM. 06/16/16   Eber Jones, MD  EPINEPHrine 0.3 mg/0.3 mL IJ SOAJ injection Inject 0.3 mg into the muscle as needed (allergic reaction).     [provider]  escitalopram (LEXAPRO) 20 MG tablet Take 20 mg by mouth daily.  11/03/15   [provider]  isosorbide mononitrate (IMDUR) 30 MG 24 hr tablet Take 3 tablets (90 mg total) by mouth daily. 07/20/16   Satira Sark, MD  lisinopril (PRINIVIL,ZESTRIL) 10 MG tablet Take 1 tablet (10 mg total) by mouth daily. 06/16/16   Eber Jones, MD  metoprolol tartrate (LOPRESSOR) 25 MG tablet Take 1.5 tablets (37.5 mg total) by mouth 2 (two) times daily. 05/10/17   Noemi Chapel,  MD  Multiple Vitamin (MULTIVITAMIN WITH MINERALS) TABS tablet Take 1 tablet by mouth daily.    [provider]  nitroGLYCERIN (NITROSTAT) 0.4 MG SL tablet Place 1 tablet (0.4 mg total) under the tongue every 5 (five) minutes x 3 doses as needed for chest pain. If no relief after 3rd dose, proceed to the ED for an evaluation 12/17/14   Satira Sark, MD  potassium chloride SA (K-DUR,KLOR-CON) 20 MEQ tablet Take 1 tablet (20 mEq total) by mouth daily. 06/17/16   Eber Jones, MD    Family History Family History  Problem Relation Age of Onset  . Colon cancer Paternal Grandfather   . Cancer Father   . Cancer Maternal Uncle   .  Alzheimer's disease Paternal Aunt   . Cirrhosis Maternal Uncle   . Cancer Paternal Aunt        lung  . Heart disease Brother   . Heart attack Brother   . Mental illness Daughter   . ADD / ADHD Daughter   . Bipolar disorder Daughter   . Mental illness Son   . ADD / ADHD Son   . Bipolar disorder Son   . Mental illness Son   . ADD / ADHD Son   . Bipolar disorder Son     Social History Social History   Tobacco Use  . Smoking status: Former Smoker    Packs/day: 0.20    Years: 32.00    Pack years: 6.40    Types: Cigarettes    Start date: 07/05/1983    Last attempt to quit: 04/03/2012    Years since quitting: 5.1  . Smokeless tobacco: Never Used  Substance Use Topics  . Alcohol use: No    Alcohol/week: 0.0 oz  . Drug use: No     Allergies   Bee venom; Penicillins; and Shellfish allergy   Review of Systems Review of Systems  All other systems reviewed and are negative.    Physical Exam Updated Vital Signs BP (!) 184/99 (BP Location: Right Arm)   Pulse 76   Temp 98.5 F (36.9 C) (Oral)   Resp 18   Ht 5' 4.5" (1.638 m)   Wt 68 kg (150 lb)   SpO2 97%   BMI 25.35 kg/m   Physical Exam  Constitutional: She appears well-developed and well-nourished. No distress.  HENT:  Head: Normocephalic and atraumatic.  Mouth/Throat:  Oropharynx is clear and moist. No oropharyngeal exudate.  Eyes: Conjunctivae and EOM are normal. Pupils are equal, round, and reactive to light. Right eye exhibits no discharge. Left eye exhibits no discharge. No scleral icterus.  Neck: Normal range of motion. Neck supple. No JVD present. No thyromegaly present.  Cardiovascular: Normal rate, regular rhythm, normal heart sounds and intact distal pulses. Exam reveals no gallop and no friction rub.  No murmur heard. Pulmonary/Chest: Effort normal and breath sounds normal. No respiratory distress. She has no wheezes. She has no rales.  Abdominal: Soft. Bowel sounds are normal. She exhibits no distension and no mass. There is no tenderness.  Musculoskeletal: Normal range of motion. She exhibits no edema or tenderness.  There is no rash to the lower extremities, no edema, no deformities.  She has very supple hips knees and ankles without pain with range of motion of any of these joints either active or passive range of motion  Lymphadenopathy:    She has no cervical adenopathy.  Neurological: She is alert. Coordination normal.  Has decreased sensation to light touch bilaterally but has a hyperesthesia to the medial left lower extremity over the medial calf down towards the ankle.  Skin: Skin is warm and dry. No rash noted. No erythema.  No rashes to the skin  Psychiatric: She has a normal mood and affect. Her behavior is normal.  Nursing note and vitals reviewed.    ED Treatments / Results  Labs (all labs ordered are listed, but only abnormal results are displayed) Labs Reviewed - No data to display   Radiology US Venous Img Lower Unilateral Right  Result Date: 05/10/2017 CLINICAL DATA:  Right lower extremity pain and edema for the past day. History of prior DVT. History of uterine and breast cancer. Evaluate for acute or chronic DVT. EXAM: RIGHT LOWER EXTREMITY  VENOUS DOPPLER ULTRASOUND TECHNIQUE: Gray-scale sonography with graded  compression, as well as color Doppler and duplex ultrasound were performed to evaluate the lower extremity deep venous systems from the level of the common femoral vein and including the common femoral, femoral, profunda femoral, popliteal and calf veins including the posterior tibial, peroneal and gastrocnemius veins when visible. The superficial great saphenous vein was also interrogated. Spectral Doppler was utilized to evaluate flow at rest and with distal augmentation maneuvers in the common femoral, femoral and popliteal veins. COMPARISON:  None. FINDINGS: Contralateral Common Femoral Vein: Respiratory phasicity is normal and symmetric with the symptomatic side. No evidence of thrombus. Normal compressibility. Common Femoral Vein: No evidence of thrombus. Normal compressibility, respiratory phasicity and response to augmentation. Saphenofemoral Junction: No evidence of thrombus. Normal compressibility and flow on color Doppler imaging. Profunda Femoral Vein: No evidence of thrombus. Normal compressibility and flow on color Doppler imaging. Femoral Vein: No evidence of thrombus. Normal compressibility, respiratory phasicity and response to augmentation. Popliteal Vein: No evidence of thrombus. Normal compressibility, respiratory phasicity and response to augmentation. Calf Veins: No evidence of thrombus. Normal compressibility and flow on color Doppler imaging. Superficial Great Saphenous Vein: No evidence of thrombus. Normal compressibility. Venous Reflux:  None. Other Findings:  None. IMPRESSION: No evidence of acute or chronic DVT within the right lower extremity. Electronically Signed   By: Sandi Mariscal M.D.   On: 05/10/2017 14:00    Procedures Procedures (including critical care time)  Medications Ordered in ED Medications  predniSONE (DELTASONE) tablet 40 mg (not administered)     Initial Impression / Assessment and Plan / ED Course  I have reviewed the triage vital signs and the nursing  notes.  Pertinent labs & imaging results that were available during my care of the patient were reviewed by me and considered in my medical decision making (see chart for details).     There is no signs of DVT on ultrasound, her exam shows no signs of neurologic deficits outside of her normal baseline bilateral numbness to light touch.  She does have hyperesthesias in the medial right leg, there is no signs of zoster, she will be given some prednisone, she can follow-up in the outpatient setting.  This does not appear to be consistent with DVT, shingles, trauma, stroke.  Patient expressed understanding.  She is also suffering from hypertension and has been off of her medications because they have been taken off the market, she has not been able to see her doctor, I have given her a prescription for her home blood pressure medications.  She does have a follow-up within a month and a half  Final Clinical Impressions(s) / ED Diagnoses   Final diagnoses:  Hyperesthesia  Essential hypertension    ED Discharge Orders        Ordered    metoprolol tartrate (LOPRESSOR) 25 MG tablet  2 times daily     05/10/17 1631       Noemi Chapel, MD 05/10/17 [phone]

## 2017-05-10 NOTE — ED Triage Notes (Signed)
Pt c/o of right leg pain with tightness since yesterday. No redness or swelling noted. Pt states she has a blood clot in same leg in her hx.

## 2017-05-20 ENCOUNTER — Ambulatory Visit: Payer: Medicare Other | Admitting: Cardiology

## 2017-05-23 NOTE — Progress Notes (Signed)
Cardiology Office Note  Date: 05/24/2017   ID: Bethany Wang, DOB [birth year], MRN [id number]  PCP: [name], MD  Primary Cardiologist: [name], MD   Chief Complaint  Patient presents with  . Coronary Artery Disease    History of Present Illness: Bethany Wang is a 52 y.o. female last seen in September 2018.  She presents today with significant other for a routine follow-up visit.  She tells me that she has been having more frequent episodes of chest pain, however she stopped both her Lopressor and lisinopril at least a month ago voicing concerns about potential contaminants in those medications.  She also has been under a lot of emotional stress and having trouble with chronic back pain.  We went over her medications today and discussed possible alternatives to consider.  We have decided to try both low-dose atenolol and Cozaar as replacements.  Hopefully this will help with angina symptoms if blood pressure and heart rate are better controlled as well.  She reports a pending follow-up visit with Dr. [name] later this month and can have her blood pressure rechecked at that time.  Past Medical History:  Diagnosis Date  . Anemia   . Bilateral breast cysts 12/30/2015  . Breast cancer ([place])   . BV (bacterial vaginosis) 09/09/2015  . Coronary atherosclerosis of native coronary artery    DES to RCA 03/2012, DES to LAD at [facility] 05/2012  . Cyst of pharynx or nasopharynx    [name]  . Essential hypertension   . Falls    x 3-4 in past year  . GERD (gastroesophageal reflux disease)   . History of breast cancer 09/09/2015  . History of hematuria   . Mixed hyperlipidemia   . PUD (peptic ulcer disease)   . Seizures ([place])   . Shingles 09/09/2015  . Suicide attempt ([place])    3 attempts in remote past  . Type 2 diabetes mellitus ([place])   . Uterine cancer [facility])     Past Surgical History:  Procedure Laterality Date  . ABDOMINAL HYSTERECTOMY    .  BLADDER SURGERY    . CARDIAC CATHETERIZATION N/A 05/10/2016   Procedure: Left Heart Cath and Coronary Angiography;  Surgeon: Belva Crome, MD;  Location: [place] CV LAB;  Service: Cardiovascular;  Laterality: N/A;  . COLONOSCOPY  May 2010   Fleishman: normal rectum, internal hemorrhoids, , benign colonic polyp  . COLONOSCOPY N/A 01/05/2017   Procedure: COLONOSCOPY;  Surgeon: Rogene Houston, MD;  Location[facility];  Service: Endoscopy;  Laterality: N/A;  1:00  . ESOPHAGOGASTRODUODENOSCOPY     2001 Dr. Amedeo Plenty: distal esophagitis, small hiatal hernia,. Dr. Tamala Julian 2006? no records available currently, pt also reports  EGD a few years ago with Dr. Gala Romney, do not have these reports anywhere in medical records  . ESOPHAGOGASTRODUODENOSCOPY  06/02/2011   OEU:MPNTIR pill impaction as described above s/p dilation of a probable cervical esophageal web/bx abnormal esophageal and gastric mucosa. + H.pylori gastritis   . EYE SURGERY     Removed glass  . HAND SURGERY Right   . Left breast lumpectomy     Benign  . LEFT HEART CATHETERIZATION WITH CORONARY ANGIOGRAM N/A 07/03/2014   Procedure: LEFT HEART CATHETERIZATION WITH CORONARY ANGIOGRAM;  Surgeon: Belva Crome, MD;  Location: [facility] CATH LAB;  Service: Cardiovascular;  Laterality: N/A;  . POLYPECTOMY  01/05/2017   Procedure: POLYPECTOMY;  Surgeon: Rogene Houston, MD;  [facility];  Service: Endoscopy;;  colon  . SHOULDER SURGERY Left     Current Outpatient Medications  Medication Sig Dispense Refill  . aspirin EC 81 MG tablet Take 1 tablet (81 mg total) by mouth daily.    [name] atorvastatin (LIPITOR) 40 MG tablet Take 1 tablet (40 mg total) by mouth daily at 6 PM. 30 tablet 0  . EPINEPHrine 0.3 mg/0.3 mL IJ SOAJ injection Inject 0.3 mg into the muscle as needed (allergic reaction).     [name] escitalopram (LEXAPRO) 20 MG tablet Take 20 mg by mouth daily.     . isosorbide mononitrate (IMDUR) 30 MG 24 hr tablet Take 3 tablets (90 mg total) by  mouth daily. 90 tablet 6  . Multiple Vitamin (MULTIVITAMIN WITH MINERALS) TABS tablet Take 1 tablet by mouth daily.    . nitroGLYCERIN (NITROSTAT) 0.4 MG SL tablet Place 1 tablet (0.4 mg total) under the tongue every 5 (five) minutes x 3 doses as needed for chest pain. If no relief after 3rd dose, proceed to the ED for an evaluation 25 tablet 3  . potassium chloride SA (K-DUR,KLOR-CON) 20 MEQ tablet Take 1 tablet (20 mEq total) by mouth daily. 30 tablet 0  . atenolol (TENORMIN) 25 MG tablet Take 1 tablet (25 mg total) by mouth daily. 90 tablet 3  . losartan (COZAAR) 25 MG tablet Take 1 tablet (25 mg total) by mouth daily. 90 tablet 3   No current facility-administered medications for this visit.    Allergies:  Bee venom; Penicillins; and Shellfish allergy   Social History: The patient  reports that she quit smoking about 5 years ago. Her smoking use included cigarettes. She started smoking about 33 years ago. She has a 6.40 pack-year smoking history. she has never used smokeless tobacco. She reports that she does not drink alcohol or use drugs.   ROS:  Please see the history of present illness. Otherwise, complete review of systems is positive for anxiety.  All other systems are reviewed and negative.   Physical Exam: VS:  BP (!) 154/98 (BP Location: Right Arm)   Pulse 76   Ht 5\' 4"  (1.626 m)   Wt 156 lb (70.8 kg)   SpO2 97%   BMI 26.78 kg/m , BMI Body mass index is 26.78 kg/m.  Wt Readings from Last 3 Encounters:  05/24/17 156 lb (70.8 kg)  05/10/17 150 lb (68 kg)  01/05/17 153 lb (69.4 kg)    General: Patient is in no acute distress. HEENT: Conjunctiva and lids normal, oropharynx clear. Neck: Supple, no elevated JVP or carotid bruits, no thyromegaly. Lungs: Clear to auscultation, nonlabored breathing at rest. Cardiac: Regular rate and rhythm, no S3 or significant systolic murmur, no pericardial rub. Abdomen: Soft, nontender, bowel sounds present. Extremities: No pitting edema,  distal pulses 2+. Skin: Warm and dry. Musculoskeletal: No kyphosis. Neuropsychiatric: Alert and oriented x3, affect grossly appropriate.  ECG: I personally reviewed the tracing from 12/31/2016 which showed normal sinus rhythm.  Recent Labwork: 06/28/2016: ALT 19; AST 20; BUN 10; Creatinine, Ser 0.95; Hemoglobin 14.3; Platelets 236; Potassium 3.0; Sodium 134   Other Studies Reviewed Today:  Cardiac catheterization 05/10/2016:  Widely patent LAD stent. Ostial 75% second diagonal jailed by LAD stent.  Totally occluded right coronary within the previously placed stent. The right fills by left to right collaterals.  Progression of first obtuse marginal from 30% to 75-80%. The vessel caliber is in the 1.5-2 mm range and is probably best treated with medical therapy.  Widely patent  left main  Normal left ventricular function.EF estimated to be 60%. EDP is 14.  Assessment and Plan:  1.  Multivessel CAD with recurring angina symptoms complicated by medication noncompliance.  Cardiac catheterization from last year as outlined above.  At this point we will initiate atenolol 25 mg daily and Cozaar 25 mg daily.  Hopefully this will provide better angina control particularly as blood pressure comes under better control.  2.  Mixed hyperlipidemia, she has continued on Lipitor and has follow-up with Dr. [name] later this month.  3.  Essential hypertension, blood pressure is not well controlled in the setting of medication noncompliance as discussed above.  We are initiating atenolol and Cozaar as replacements for her previous regimen.  4.  Tobacco abuse in remission.  Current medicines were reviewed with the patient today.  Disposition: Follow-up in 3 months.  Signed, Satira Sark, MD, [facility] 05/24/2017 11:19 AM    [facility] at [facility] [address], [place], [place] [address] Phone: [phone]; Fax: [phone]

## 2017-05-24 ENCOUNTER — Ambulatory Visit (INDEPENDENT_AMBULATORY_CARE_PROVIDER_SITE_OTHER): Payer: Medicare HMO | Admitting: Cardiology

## 2017-05-24 ENCOUNTER — Other Ambulatory Visit: Payer: Self-pay | Admitting: Cardiology

## 2017-05-24 ENCOUNTER — Encounter: Payer: Self-pay | Admitting: Cardiology

## 2017-05-24 VITALS — BP 154/98 | HR 76 | Ht 64.0 in | Wt 156.0 lb

## 2017-05-24 DIAGNOSIS — I10 Essential (primary) hypertension: Secondary | ICD-10-CM

## 2017-05-24 DIAGNOSIS — E782 Mixed hyperlipidemia: Secondary | ICD-10-CM | POA: Diagnosis not present

## 2017-05-24 DIAGNOSIS — F17201 Nicotine dependence, unspecified, in remission: Secondary | ICD-10-CM

## 2017-05-24 DIAGNOSIS — I25119 Atherosclerotic heart disease of native coronary artery with unspecified angina pectoris: Secondary | ICD-10-CM

## 2017-05-24 MED ORDER — ATENOLOL 25 MG PO TABS: 25.0000 mg | ORAL_TABLET | Freq: Every day | ORAL | 3 refills | Status: DC

## 2017-05-24 MED ORDER — LOSARTAN POTASSIUM 25 MG PO TABS: 25.0000 mg | ORAL_TABLET | Freq: Every day | ORAL | 3 refills | Status: DC

## 2017-05-24 NOTE — Patient Instructions (Addendum)
Your physician wants you to follow-up in:3 months with Dr.McDowell     START Atenolol 25 mg daily   START Cozaar 25 mg daily   No lab work or tests ordered today     Thank you for choosing [facility] !

## 2017-06-03 DIAGNOSIS — G8929 Other chronic pain: Secondary | ICD-10-CM | POA: Diagnosis not present

## 2017-06-03 DIAGNOSIS — G40802 Other epilepsy, not intractable, without status epilepticus: Secondary | ICD-10-CM | POA: Diagnosis not present

## 2017-06-03 DIAGNOSIS — M79605 Pain in left leg: Secondary | ICD-10-CM | POA: Diagnosis not present

## 2017-06-03 DIAGNOSIS — M25552 Pain in left hip: Secondary | ICD-10-CM | POA: Diagnosis not present

## 2017-06-03 DIAGNOSIS — Z993 Dependence on wheelchair: Secondary | ICD-10-CM | POA: Diagnosis not present

## 2017-06-03 DIAGNOSIS — H3530 Unspecified macular degeneration: Secondary | ICD-10-CM | POA: Diagnosis not present

## 2017-06-03 DIAGNOSIS — M539 Dorsopathy, unspecified: Secondary | ICD-10-CM | POA: Diagnosis not present

## 2017-06-09 DIAGNOSIS — F419 Anxiety disorder, unspecified: Secondary | ICD-10-CM | POA: Diagnosis not present

## 2017-06-09 DIAGNOSIS — E559 Vitamin D deficiency, unspecified: Secondary | ICD-10-CM | POA: Diagnosis not present

## 2017-06-09 DIAGNOSIS — R5383 Other fatigue: Secondary | ICD-10-CM | POA: Diagnosis not present

## 2017-06-09 DIAGNOSIS — E785 Hyperlipidemia, unspecified: Secondary | ICD-10-CM | POA: Diagnosis not present

## 2017-06-09 DIAGNOSIS — I10 Essential (primary) hypertension: Secondary | ICD-10-CM | POA: Diagnosis not present

## 2017-06-09 DIAGNOSIS — E119 Type 2 diabetes mellitus without complications: Secondary | ICD-10-CM | POA: Diagnosis not present

## 2017-06-24 DIAGNOSIS — E119 Type 2 diabetes mellitus without complications: Secondary | ICD-10-CM | POA: Diagnosis not present

## 2017-07-13 ENCOUNTER — Encounter (HOSPITAL_COMMUNITY): Payer: Self-pay | Admitting: Emergency Medicine

## 2017-07-13 ENCOUNTER — Other Ambulatory Visit: Payer: Self-pay

## 2017-07-13 ENCOUNTER — Emergency Department (HOSPITAL_COMMUNITY): Payer: Medicare HMO

## 2017-07-13 ENCOUNTER — Observation Stay (HOSPITAL_COMMUNITY)
Admission: EM | Admit: 2017-07-13 | Discharge: 2017-07-14 | Disposition: A | Discharge status: Home / Self Care | Payer: Medicare HMO | Attending: Internal Medicine | Admitting: Internal Medicine

## 2017-07-13 DIAGNOSIS — R0789 Other chest pain: Secondary | ICD-10-CM

## 2017-07-13 DIAGNOSIS — I10 Essential (primary) hypertension: Secondary | ICD-10-CM

## 2017-07-13 DIAGNOSIS — F17290 Nicotine dependence, other tobacco product, uncomplicated: Secondary | ICD-10-CM | POA: Diagnosis not present

## 2017-07-13 DIAGNOSIS — R531 Weakness: Secondary | ICD-10-CM | POA: Diagnosis not present

## 2017-07-13 DIAGNOSIS — E119 Type 2 diabetes mellitus without complications: Secondary | ICD-10-CM | POA: Diagnosis not present

## 2017-07-13 DIAGNOSIS — R079 Chest pain, unspecified: Secondary | ICD-10-CM | POA: Diagnosis not present

## 2017-07-13 DIAGNOSIS — Z720 Tobacco use: Secondary | ICD-10-CM | POA: Diagnosis present

## 2017-07-13 DIAGNOSIS — Z8542 Personal history of malignant neoplasm of other parts of uterus: Secondary | ICD-10-CM | POA: Diagnosis not present

## 2017-07-13 DIAGNOSIS — R072 Precordial pain: Principal | ICD-10-CM | POA: Insufficient documentation

## 2017-07-13 DIAGNOSIS — Z853 Personal history of malignant neoplasm of breast: Secondary | ICD-10-CM | POA: Insufficient documentation

## 2017-07-13 DIAGNOSIS — E782 Mixed hyperlipidemia: Secondary | ICD-10-CM | POA: Diagnosis not present

## 2017-07-13 DIAGNOSIS — R071 Chest pain on breathing: Secondary | ICD-10-CM | POA: Diagnosis not present

## 2017-07-13 LAB — BASIC METABOLIC PANEL
ANION GAP: 9 (ref 5–15)
BUN: 11 mg/dL (ref 6–20)
CALCIUM: 9.3 mg/dL (ref 8.9–10.3)
CO2: 28 mmol/L (ref 22–32)
Chloride: 107 mmol/L (ref 101–111)
Creatinine, Ser: 0.81 mg/dL (ref 0.44–1.00)
GFR calc non Af Amer: >60 mL/min (ref >60)
Glucose, Bld: 109 mg/dL — ABNORMAL HIGH (ref 65–99)
Potassium: 3.4 mmol/L — ABNORMAL LOW (ref 3.5–5.1)
Sodium: 144 mmol/L (ref 135–145)

## 2017-07-13 LAB — CBC
HCT: 39.1 % (ref 36.0–46.0)
HEMOGLOBIN: 12.8 g/dL (ref 12.0–15.0)
MCH: 27.7 pg (ref 26.0–34.0)
MCHC: 32.7 g/dL (ref 30.0–36.0)
MCV: 84.6 fL (ref 78.0–100.0)
Platelets: 212 10*3/uL (ref 150–400)
RBC: 4.62 MIL/uL (ref 3.87–5.11)
RDW: 14 % (ref 11.5–15.5)
WBC: 5.8 10*3/uL (ref 4.0–10.5)

## 2017-07-13 LAB — TROPONIN I: Troponin I: <0.03 ng/mL (ref <0.03)

## 2017-07-13 MED ORDER — ISOSORBIDE MONONITRATE ER 60 MG PO TB24
90.0000 mg | ORAL_TABLET | Freq: Every day | ORAL | Status: DC
  Administered 2017-07-14: 90 mg via ORAL
  Filled 2017-07-13: qty 2

## 2017-07-13 MED ORDER — HYDRALAZINE HCL 20 MG/ML IJ SOLN: 10.0000 mg | Freq: Four times a day (QID) | INTRAMUSCULAR | Status: DC | PRN

## 2017-07-13 MED ORDER — ATORVASTATIN CALCIUM 40 MG PO TABS
40.0000 mg | ORAL_TABLET | Freq: Every day | ORAL | Status: DC
  Administered 2017-07-13: 40 mg via ORAL
  Filled 2017-07-13: qty 1

## 2017-07-13 MED ORDER — ONDANSETRON HCL 4 MG/2ML IJ SOLN: 4.0000 mg | Freq: Four times a day (QID) | INTRAMUSCULAR | Status: DC | PRN

## 2017-07-13 MED ORDER — ACETAMINOPHEN 325 MG PO TABS: 650.0000 mg | ORAL_TABLET | Freq: Four times a day (QID) | ORAL | Status: DC | PRN

## 2017-07-13 MED ORDER — AMLODIPINE BESYLATE 5 MG PO TABS
5.0000 mg | ORAL_TABLET | Freq: Every day | ORAL | Status: DC
  Administered 2017-07-14: 5 mg via ORAL
  Filled 2017-07-13: qty 1

## 2017-07-13 MED ORDER — MORPHINE SULFATE (PF) 4 MG/ML IV SOLN
4.0000 mg | Freq: Once | INTRAVENOUS | Status: AC
  Administered 2017-07-13: 4 mg via INTRAVENOUS
  Filled 2017-07-13: qty 1

## 2017-07-13 MED ORDER — LOSARTAN POTASSIUM 50 MG PO TABS
25.0000 mg | ORAL_TABLET | Freq: Every day | ORAL | Status: DC
  Administered 2017-07-14: 25 mg via ORAL
  Filled 2017-07-13: qty 1

## 2017-07-13 MED ORDER — POTASSIUM CHLORIDE CRYS ER 20 MEQ PO TBCR
20.0000 meq | EXTENDED_RELEASE_TABLET | Freq: Every day | ORAL | Status: DC
  Administered 2017-07-13 – 2017-07-14 (×2): 20 meq via ORAL
  Filled 2017-07-13 (×2): qty 1

## 2017-07-13 MED ORDER — NITROGLYCERIN 2 % TD OINT
1.0000 [in_us] | TOPICAL_OINTMENT | Freq: Once | TRANSDERMAL | Status: AC
  Administered 2017-07-13: 1 [in_us] via TOPICAL
  Filled 2017-07-13: qty 1

## 2017-07-13 MED ORDER — THYROID 30 MG PO TABS
30.0000 mg | ORAL_TABLET | Freq: Every day | ORAL | Status: DC
  Administered 2017-07-14: 30 mg via ORAL
  Filled 2017-07-13 (×2): qty 1

## 2017-07-13 MED ORDER — FLUTICASONE PROPIONATE HFA 220 MCG/ACT IN AERO: 1.0000 | INHALATION_SPRAY | Freq: Two times a day (BID) | RESPIRATORY_TRACT | Status: DC

## 2017-07-13 MED ORDER — ALPRAZOLAM 0.5 MG PO TABS: 0.5000 mg | ORAL_TABLET | Freq: Three times a day (TID) | ORAL | Status: DC | PRN

## 2017-07-13 MED ORDER — ATENOLOL 25 MG PO TABS
25.0000 mg | ORAL_TABLET | Freq: Every day | ORAL | Status: DC
  Administered 2017-07-14: 25 mg via ORAL
  Filled 2017-07-13: qty 1

## 2017-07-13 MED ORDER — ACETAMINOPHEN 650 MG RE SUPP: 650.0000 mg | Freq: Four times a day (QID) | RECTAL | Status: DC | PRN

## 2017-07-13 MED ORDER — ONDANSETRON HCL 4 MG PO TABS: 4.0000 mg | ORAL_TABLET | Freq: Four times a day (QID) | ORAL | Status: DC | PRN

## 2017-07-13 MED ORDER — ESCITALOPRAM OXALATE 10 MG PO TABS
20.0000 mg | ORAL_TABLET | Freq: Every day | ORAL | Status: DC
  Administered 2017-07-14: 20 mg via ORAL
  Filled 2017-07-13: qty 2

## 2017-07-13 MED ORDER — ASPIRIN EC 81 MG PO TBEC
81.0000 mg | DELAYED_RELEASE_TABLET | Freq: Every day | ORAL | Status: DC
  Administered 2017-07-14: 81 mg via ORAL
  Filled 2017-07-13: qty 1

## 2017-07-13 MED ORDER — ENOXAPARIN SODIUM 40 MG/0.4ML ~~LOC~~ SOLN
40.0000 mg | SUBCUTANEOUS | Status: DC
  Administered 2017-07-13: 40 mg via SUBCUTANEOUS
  Filled 2017-07-13: qty 0.4

## 2017-07-13 MED ORDER — BUDESONIDE 0.5 MG/2ML IN SUSP
0.5000 mg | Freq: Two times a day (BID) | RESPIRATORY_TRACT | Status: DC
  Administered 2017-07-13 – 2017-07-14 (×2): 0.5 mg via RESPIRATORY_TRACT
  Filled 2017-07-13 (×2): qty 2

## 2017-07-13 MED ORDER — HYDROCODONE-ACETAMINOPHEN 5-325 MG PO TABS
2.0000 | ORAL_TABLET | Freq: Four times a day (QID) | ORAL | Status: DC | PRN
  Administered 2017-07-13: 2 via ORAL
  Filled 2017-07-13: qty 2

## 2017-07-13 MED ORDER — ADULT MULTIVITAMIN W/MINERALS CH
1.0000 | ORAL_TABLET | Freq: Every day | ORAL | Status: DC
  Administered 2017-07-14: 1 via ORAL
  Filled 2017-07-13: qty 1

## 2017-07-13 NOTE — ED Triage Notes (Signed)
Per EMS, pt reports chest pain x18 hours. EMS reports pain is reproducable in left chest. Pt self-administered 81mg  aspirin x4. Pt has had nitro x8 at home prior to EMS arrival. Pt denies any change in chest pain. Moderate anxiety noted.

## 2017-07-13 NOTE — Consult Note (Signed)
Cardiology Consultation:   Patient ID: Bethany Wang; [id number]; [birth year]   Admit date: 07/13/2017 Date of Consult: 07/13/2017  Primary Care Provider: [name], MD Primary Cardiologist: Domenic Polite    Patient Profile:   Bethany Wang is a 52 y.o. female with a hx of CAD who is being seen today for the evaluation of chest pain at the request of Dr Laverta Baltimore  History of Present Illness:   Bethany Wang 52 yo female history of CAD with prior DES to RCA in 2013 and LAD in 2014, with last cath Jan 2018 as reported below, HTN, PUD, HL, DM2 presents with chest pain.  From clinic notes she has intermittent chest pain over the last few months. It was noted she had mixed compliance with her medcations, and thought could play some role.   She reports episode of chest pain starting last night after a large argument with her husband and children. Initially mild left chest pressure, but progressed in severity overnight. Not better with NG. 7-8/10 in severity, worst with breathing and position, +SOB, +palpitations, +anxiety. Pain has been constanst without relief since 8pm last night.   K 3.4 Cr 0.81 WBC 5.8 Hgb 12.8  Trop neg x 1 EKG SR, nonspecific ST/T changes CXR no acute process   Past Medical History:  Diagnosis Date  . Anemia   . Bilateral breast cysts 12/30/2015  . Breast cancer ([place])   . BV (bacterial vaginosis) 09/09/2015  . Coronary atherosclerosis of native coronary artery    DES to RCA 03/2012, DES to LAD at [facility] 05/2012  . Cyst of pharynx or nasopharynx    [name]  . Essential hypertension   . Falls    x 3-4 in past year  . GERD (gastroesophageal reflux disease)   . History of breast cancer 09/09/2015  . History of hematuria   . Mixed hyperlipidemia   . PUD (peptic ulcer disease)   . Seizures ([place])   . Shingles 09/09/2015  . Suicide attempt ([place])    3 attempts in remote past  . Type 2 diabetes mellitus ([place])   . Uterine cancer [facility])     Past  Surgical History:  Procedure Laterality Date  . ABDOMINAL HYSTERECTOMY    . BLADDER SURGERY    . CARDIAC CATHETERIZATION N/A 05/10/2016   Procedure: Left Heart Cath and Coronary Angiography;  Surgeon: Belva Crome, MD;  Location: [place] [facility] LAB;  Service: Cardiovascular;  Laterality: N/A;  . COLONOSCOPY  May 2010   Fleishman: normal rectum, internal hemorrhoids, , benign colonic polyp  . COLONOSCOPY N/A 01/05/2017   Procedure: COLONOSCOPY;  Surgeon: Rogene Houston, MD;  [facility];  Service: Endoscopy;  Laterality: N/A;  1:00  . ESOPHAGOGASTRODUODENOSCOPY     2001 Dr. Amedeo Plenty: distal esophagitis, small hiatal hernia,. Dr. Tamala Julian 2006? no records available currently, pt also reports  EGD a few years ago with Dr. Gala Romney, do not have these reports anywhere in medical records  . ESOPHAGOGASTRODUODENOSCOPY  06/02/2011   POE:UMPNTI pill impaction as described above s/p dilation of a probable cervical esophageal web/bx abnormal esophageal and gastric mucosa. + H.pylori gastritis   . EYE SURGERY     Removed glass  . HAND SURGERY Right   . Left breast lumpectomy     Benign  . LEFT HEART CATHETERIZATION WITH CORONARY ANGIOGRAM N/A 07/03/2014   Procedure: LEFT HEART CATHETERIZATION WITH CORONARY ANGIOGRAM;  Surgeon: Belva Crome, MD;  Location: [facility] CATH LAB;  Service: Cardiovascular;  Laterality: N/A;  . POLYPECTOMY  01/05/2017   Procedure: POLYPECTOMY;  Surgeon: Rogene Houston, MD;  [facility];  Service: Endoscopy;;  colon  . SHOULDER SURGERY Left        Inpatient Medications: Scheduled Meds: . morphine  4 mg Intravenous Once   Continuous Infusions:  PRN Meds:   Allergies:    Allergies  Allergen Reactions  . Bee Venom Anaphylaxis    "throat swelled and had to the hospital" - Yellow jacket sting  . Penicillins Anaphylaxis    Has patient had a PCN reaction causing immediate rash, facial/tongue/throat swelling, SOB or lightheadedness with hypotension: Yes Has  patient had a PCN reaction causing severe rash involving mucus membranes or skin necrosis: Yes Has patient had a PCN reaction that required hospitalization Yes Has patient had a PCN reaction occurring within the last 10 years: Yes If all of the above answers are "NO", then may proceed with Cephalosporin use. Patient states she carries an epi-pen  . Shellfish Allergy Swelling    Social History:   Social History   Socioeconomic History  . Marital status: Single    Spouse name: Not on file  . Number of children: 3  . Years of education: Assoc  . Highest education level: Not on file  Occupational History  . Occupation: Retired    Comment: disability  Social Needs  . Financial resource strain: Not on file  . Food insecurity:    Worry: Not on file    Inability: Not on file  . Transportation needs:    Medical: Not on file    Non-medical: Not on file  Tobacco Use  . Smoking status: Former Smoker    Packs/day: 0.20    Years: 32.00    Pack years: 6.40    Types: Cigarettes    Start date: 07/05/1983    Last attempt to quit: 04/03/2012    Years since quitting: 5.2  . Smokeless tobacco: Never Used  Substance and Sexual Activity  . Alcohol use: No    Alcohol/week: 0.0 oz  . Drug use: No  . Sexual activity: Yes    Birth control/protection: Surgical    Comment: hyst  Lifestyle  . Physical activity:    Days per week: Not on file    Minutes per session: Not on file  . Stress: Not on file  Relationships  . Social connections:    Talks on phone: Not on file    Gets together: Not on file    Attends religious service: Not on file    Active member of club or organization: Not on file    Attends meetings of clubs or organizations: Not on file    Relationship status: Not on file  . Intimate partner violence:    Fear of current or ex partner: Not on file    Emotionally abused: Not on file    Physically abused: Not on file    Forced sexual activity: Not on file  Other Topics Concern   . Not on file  Social History Narrative   Patient lives at home with her fiance.   Caffeine Use: 3 cups daily    Family History:    Family History  Problem Relation Age of Onset  . Colon cancer Paternal Grandfather   . Cancer Father   . Cancer Maternal Uncle   . Alzheimer's disease Paternal Aunt   . Cirrhosis Maternal Uncle   . Cancer Paternal Aunt        lung  .  Heart disease Brother   . Heart attack Brother   . Mental illness Daughter   . ADD / ADHD Daughter   . Bipolar disorder Daughter   . Mental illness Son   . ADD / ADHD Son   . Bipolar disorder Son   . Mental illness Son   . ADD / ADHD Son   . Bipolar disorder Son      ROS:  Please see the history of present illness.  All other ROS reviewed and negative.     Physical Exam/Data:   Vitals:   07/13/17 1420 07/13/17 1433 07/13/17 1500 07/13/17 1600  BP: (!) 162/104  (!) 142/87 (!) 152/84  Pulse: 67  (!) 59 (!) 58  Resp: 15  17 19   Temp: 98.5 F (36.9 C)     TempSrc: Oral     SpO2: 100% 99% 100% 99%  Weight:      Height:       No intake or output data in the 24 hours ending 07/13/17 1615 Filed Weights   07/13/17 1415  Weight: 151 lb (68.5 kg)   Body mass index is 25.92 kg/m.  General:  Well nourished, well developed, in no acute distress HEENT: normal Lymph: no adenopathy Neck: no JVD Endocrine:  No thryomegaly Cardiac:  normal S1, S2; RRR; no murmur Lungs:  clear to auscultation bilaterally, no wheezing, rhonchi or rales  Abd: soft, nontender, no hepatomegaly  Ext: no edema Musculoskeletal:  No deformities, BUE and BLE strength normal and equal Skin: warm and dry  Neuro:  CNs 2-12 intact, no focal abnormalities noted Psych:  Normal affect     Laboratory Data:  Chemistry Recent Labs  Lab 07/13/17 1507  NA 144  K 3.4*  CL 107  CO2 28  GLUCOSE 109*  BUN 11  CREATININE 0.81  CALCIUM 9.3  GFRNONAA >60  GFRAA >60  ANIONGAP 9    No results for input(s): PROT, ALBUMIN, AST, ALT,  ALKPHOS, BILITOT in the last 168 hours. Hematology Recent Labs  Lab 07/13/17 1507  WBC 5.8  RBC 4.62  HGB 12.8  HCT 39.1  MCV 84.6  MCH 27.7  MCHC 32.7  RDW 14.0  PLT 212   Cardiac Enzymes Recent Labs  Lab 07/13/17 1507  TROPONINI <0.03   No results for input(s): TROPIPOC in the last 168 hours.  BNPNo results for input(s): BNP, PROBNP in the last 168 hours.  DDimer No results for input(s): DDIMER in the last 168 hours.  Radiology/Studies:  Dg Chest 2 View  Result Date: 07/13/2017 CLINICAL DATA:  Left-sided chest pain beginning at 8 p.m. last night. EXAM: CHEST - 2 VIEW COMPARISON:  One-view chest x-ray 06/14/2016 FINDINGS: The heart size is normal. Interstitial coarsening is stable. There is no edema or effusion. No focal airspace disease is present. The visualized soft tissues and bony thorax are unremarkable. IMPRESSION: No active cardiopulmonary disease. Electronically Signed   By: San Morelle M.D.   On: 07/13/2017 15:07 May 2016 cath  Widely patent LAD stent. Ostial 75% second diagonal jailed by LAD stent.  Totally occluded right coronary within the previously placed stent. The right fills by left to right collaterals.  Progression of first obtuse marginal from 30% to 75-80%. The vessel caliber is in the 1.5-2 mm range and is probably best treated with medical therapy.  Widely patent left main  Normal left ventricular function.EF estimated to be 60%. EDP is 14.  RECOMMENDATIONS:  Suspect angina is coming either from  the first obtuse marginal, the second diagonal which is jailed, or from the collateralized right coronary.  Intensify medical therapy. ? Add Ranexa.  Intensify risk factor modification including LDL cholesterol less than 70.  Consider CTO PCI of the RCA if symptoms become refractory.  Consider PCI on the small to moderate size first obtuse marginal, however this vessel would possibly be oversized by our smallest stent.   Assessment  and Plan:   1. CAD/Chest - atypical chest pain. Constant since 8pm last night (nearly 21 hrs) and positional. Started after severe argument with her family, she also had fallen and had a mechanical fall at home. Pain is similar in character to her prior chest pain.  - no objective evidence of ishcemia thus far by EKG or enzymes. - cath Jan 2018 patent LAD stent, jailed D2, RCA CTO with collaterals, OM1 80% small and recs to treat medically.  - notes indicate prior rash on ranexa. Current on beta blocker, ccb, nitrates as antianginal  - low suspicion of ACS at this time. Given her history will require observation overnight with cycling of her cardiac enzymes. Make npo overnight in case invasive testing becomes required.       For questions or updates, please contact [place] Please consult [email or web address] for contact info under Cardiology/STEMI.   Merrily Pew, MD  07/13/2017 4:15 PM

## 2017-07-13 NOTE — ED Provider Notes (Signed)
Emergency Department Provider Note   I have reviewed the triage vital signs and the nursing notes.   HISTORY  Chief Complaint Chest Pain   HPI [name] is a 52 y.o. female with PMH of CAD s/p DES to the RCA and LAD, HTN, HLD, GERD, and DM resents to the emergency department for evaluation of central chest pain radiating to the left chest for the past 18 hours.  The symptoms began last night at 8 PM in the setting of having an argument with family.  Her chest pain continued through the night and morning despite taking 324 of aspirin and nitroglycerin x 8.  With no improvement in symptoms she ultimately called EMS and was transported to the emergency department.  She states that this feels like pain typical of her angina.  Her last heart catheterization was January 2018.  She was started on Ranexa but had to discontinue this medication because of an acute allergic reaction. No modifying factors.    Past Medical History:  Diagnosis Date  . Anemia   . Bilateral breast cysts 12/30/2015  . Breast cancer ([place])   . BV (bacterial vaginosis) 09/09/2015  . Coronary atherosclerosis of native coronary artery    DES to RCA 03/2012, DES to LAD at [facility] 05/2012  . Cyst of pharynx or nasopharynx    [name]  . Essential hypertension   . Falls    x 3-4 in past year  . GERD (gastroesophageal reflux disease)   . History of breast cancer 09/09/2015  . History of hematuria   . Mixed hyperlipidemia   . PUD (peptic ulcer disease)   . Seizures ([place])   . Shingles 09/09/2015  . Suicide attempt ([place])    3 attempts in remote past  . Type 2 diabetes mellitus ([place])   . Uterine cancer [facility])     Patient Active Problem List   Diagnosis Date Noted  . Chest pain 07/13/2017  . Nonspecific chest pain 07/13/2017  . Essential hypertension 07/13/2017  . Mixed hyperlipidemia 07/13/2017  . Precordial chest pain   . Hypertensive urgency 06/15/2016  . Unstable angina pectoris ([place])  06/15/2016  . Unstable angina ([place]) 06/15/2016  . Bilateral breast cysts 12/30/2015  . Vaginal discharge 09/09/2015  . BV (bacterial vaginosis) 09/09/2015  . Shingles 09/09/2015  . History of breast cancer 09/09/2015  . Syncope and collapse 08/08/2015  . Migraine with aura and with status migrainosus, not intractable 08/08/2015  . Chronic low back pain 08/08/2015  . Insomnia 08/08/2015  . Anxiety state 08/08/2015  . Panic attacks 08/08/2015  . Anemia, iron deficiency 08/08/2015  . Pica 08/08/2015  . Angina decubitus ([place]) 07/03/2014  . 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 Encounter for gynecological examination with Papanicolaou smear of cervix 02/04/2014  . Syncope 12/14/2013  . Cervical disc disorder with radiculopathy of cervical region 02/19/2013  . H/O rotator cuff surgery 02/19/2013  . Dyspareunia 02/01/2013  . Left shoulder pain 01/24/2013  . Rotator cuff tear 01/24/2013  . Radicular pain 01/24/2013  . Labral tear of shoulder 01/24/2013  . Complex regional pain syndrome of upper extremity 01/24/2013  . Herniated disc, cervical 01/24/2013  . Abdominal pain, other specified site 01/18/2013  . Nausea and vomiting 01/18/2013  . Rectal bleeding 01/18/2013  . Edema of left foot 10/16/2012  . Coronary atherosclerosis of native coronary artery   . Essential hypertension, benign   . HLD (hyperlipidemia)   . Tobacco abuse     Past Surgical History:  Procedure Laterality Date  . ABDOMINAL HYSTERECTOMY    . BLADDER SURGERY    . CARDIAC CATHETERIZATION N/A 05/10/2016   Procedure: Left Heart Cath and Coronary Angiography;  Surgeon: Belva Crome, MD;  Location: [place] [facility] LAB;  Service: Cardiovascular;  Laterality: N/A;  . COLONOSCOPY  May 2010   Fleishman: normal rectum, internal hemorrhoids, , benign colonic polyp  . COLONOSCOPY N/A 01/05/2017   Procedure: COLONOSCOPY;  Surgeon: Rogene Houston, MD;  [facility];  Service: Endoscopy;  Laterality: N/A;   1:00  . ESOPHAGOGASTRODUODENOSCOPY     2001 Dr. Amedeo Plenty: distal esophagitis, small hiatal hernia,. Dr. Tamala Julian 2006? no records available currently, pt also reports  EGD a few years ago with Dr. Gala Romney, do not have these reports anywhere in medical records  . ESOPHAGOGASTRODUODENOSCOPY  06/02/2011   DDU:KGURKY pill impaction as described above s/p dilation of a probable cervical esophageal web/bx abnormal esophageal and gastric mucosa. + H.pylori gastritis   . EYE SURGERY     Removed glass  . HAND SURGERY Right   . Left breast lumpectomy     Benign  . LEFT HEART CATHETERIZATION WITH CORONARY ANGIOGRAM N/A 07/03/2014   Procedure: LEFT HEART CATHETERIZATION WITH CORONARY ANGIOGRAM;  Surgeon: Belva Crome, MD;  Location: [facility] CATH LAB;  Service: Cardiovascular;  Laterality: N/A;  . POLYPECTOMY  01/05/2017   Procedure: POLYPECTOMY;  Surgeon: Rogene Houston, MD;  [facility];  Service: Endoscopy;;  colon  . SHOULDER SURGERY Left       Allergies Bee venom; Penicillins; and Shellfish allergy  Family History  Problem Relation Age of Onset  . Colon cancer Paternal Grandfather   . Cancer Father   . Cancer Maternal Uncle   . Alzheimer's disease Paternal Aunt   . Cirrhosis Maternal Uncle   . Cancer Paternal Aunt        lung  . Heart disease Brother   . Heart attack Brother   . Mental illness Daughter   . ADD / ADHD Daughter   . Bipolar disorder Daughter   . Mental illness Son   . ADD / ADHD Son   . Bipolar disorder Son   . Mental illness Son   . ADD / ADHD Son   . Bipolar disorder Son     Social History Social History   Tobacco Use  . Smoking status: Former Smoker    Packs/day: 0.20    Years: 32.00    Pack years: 6.40    Types: Cigarettes    Start date: 07/05/1983    Last attempt to quit: 04/03/2012    Years since quitting: 5.2  . Smokeless tobacco: Never Used  Substance Use Topics  . Alcohol use: No    Alcohol/week: 0.0 oz  . Drug use: No    Review of  Systems  Constitutional: No fever/chills Eyes: No visual changes. ENT: No sore throat. Cardiovascular: Positive chest pain. Respiratory: Denies shortness of breath. Gastrointestinal: No abdominal pain.  No nausea, no vomiting.  No diarrhea.  No constipation. Genitourinary: Negative for dysuria. Musculoskeletal: Negative for back pain. Skin: Negative for rash. Neurological: Negative for headaches, focal weakness or numbness.  10-point ROS otherwise negative.  ____________________________________________   PHYSICAL EXAM:  VITAL SIGNS: ED Triage Vitals  Enc Vitals Group     BP 07/13/17 1420 (!) 162/104     Pulse Rate 07/13/17 1420 67     Resp 07/13/17 1420 15     Temp 07/13/17 1420 98.5 F (36.9  C)     Temp Source 07/13/17 1420 Oral     SpO2 07/13/17 1420 100 %     Weight 07/13/17 1415 151 lb (68.5 kg)     Height 07/13/17 1415 5\' 4"  (1.626 m)     Pain Score 07/13/17 1415 9   Constitutional: Alert and oriented. Well appearing and in no acute distress. Eyes: Conjunctivae are normal. Head: Atraumatic. Nose: No congestion/rhinnorhea. Mouth/Throat: Mucous membranes are moist.  Neck: No stridor. Cardiovascular: Normal rate, regular rhythm. Good peripheral circulation. Grossly normal heart sounds.   Respiratory: Normal respiratory effort.  No retractions. Lungs CTAB. Gastrointestinal: Soft and nontender. No distention.  Musculoskeletal: No lower extremity tenderness nor edema. No gross deformities of extremities. Neurologic:  Normal speech and language. No gross focal neurologic deficits are appreciated.  Skin:  Skin is warm, dry and intact. No rash noted.  ____________________________________________   LABS (all labs ordered are listed, but only abnormal results are displayed)  Labs Reviewed  BASIC METABOLIC PANEL - Abnormal; Notable for the following components:      Result Value   Potassium 3.4 (*)    Glucose, Bld 109 (*)    All other components within normal limits   CBC  TROPONIN I  HIV ANTIBODY (ROUTINE TESTING)  BASIC METABOLIC PANEL  TROPONIN I  TROPONIN I  RAPID URINE DRUG SCREEN, HOSP PERFORMED  MAGNESIUM  HEMOGLOBIN A1C  LIPID PANEL   ____________________________________________  EKG   EKG Interpretation  Date/Time:  Wednesday July 13 2017 14:17:36 EDT Ventricular Rate:  70 PR Interval:    QRS Duration: 117 QT Interval:  426 QTC Calculation: 460 R Axis:   46 Text Interpretation:  Sinus rhythm Probable left ventricular hypertrophy No STEMI.  Confirmed by Nanda Quinton [phone]) on 07/13/2017 2:48:10 PM       ____________________________________________  RADIOLOGY  Dg Chest 2 View  Result Date: 07/13/2017 CLINICAL DATA:  Left-sided chest pain beginning at 8 p.m. last night. EXAM: CHEST - 2 VIEW COMPARISON:  One-view chest x-ray 06/14/2016 FINDINGS: The heart size is normal. Interstitial coarsening is stable. There is no edema or effusion. No focal airspace disease is present. The visualized soft tissues and bony thorax are unremarkable. IMPRESSION: No active cardiopulmonary disease. Electronically Signed   By: San Morelle M.D.   On: 07/13/2017 15:26    ____________________________________________   PROCEDURES  Procedure(s) performed:   Procedures  None ____________________________________________   INITIAL IMPRESSION / ASSESSMENT AND PLAN / ED COURSE  Pertinent labs & imaging results that were available during my care of the patient were reviewed by me and considered in my medical decision making (see chart for details).  Patient presents to the emergency department for evaluation of chest pain over the past 18 hours.  Her heart catheterization from 2018 shows a completely occluded RCA with collateral flow.  There is a ostial artery jailed by the LAD stent which was patent at that time. The 1st obtuse marginal had progressed but was unable to be stented.  She frequently has angina but this has become more severe  and constant and seems much worse than her typical symptoms.  Plan for morphine along with nitroglycerin ointment.  Will obtain baseline labs and discuss with cardiology regarding further medical management here versus transfer.   04:10 PM Troponin negative. Labs and imaging reviewed with no acute findings. Will discuss with the local cards group regarding medically complex case. Patient continued to have chest pain that is improved slightly with morphine and Nitro.  Spoke with Cardiology Dr. Harl Bowie. He will come and evaluate in the ED. Ok with admit here for medication adjustment and enzyme cycling overnight. Will page the hospitalist for admission.   Discussed patient's case with Hospitalist to request admission. Patient and family (if present) updated with plan. Care transferred to Hospitalist service.  I reviewed all nursing notes, vitals, pertinent old records, EKGs, labs, imaging (as available).  ____________________________________________  FINAL CLINICAL IMPRESSION(S) / ED DIAGNOSES  Final diagnoses:  Precordial chest pain     MEDICATIONS GIVEN DURING THIS VISIT:  Medications  amLODipine (NORVASC) tablet 5 mg (has no administration in time range)  thyroid (ARMOUR) tablet 30 mg (has no administration in time range)  aspirin EC tablet 81 mg (has no administration in time range)  atenolol (TENORMIN) tablet 25 mg (has no administration in time range)  atorvastatin (LIPITOR) tablet 40 mg (has no administration in time range)  escitalopram (LEXAPRO) tablet 20 mg (has no administration in time range)  isosorbide mononitrate (IMDUR) 24 hr tablet 90 mg (has no administration in time range)  losartan (COZAAR) tablet 25 mg (has no administration in time range)  multivitamin with minerals tablet 1 tablet (has no administration in time range)  potassium chloride SA (K-DUR,KLOR-CON) CR tablet 20 mEq (has no administration in time range)  enoxaparin (LOVENOX) injection 40 mg (has no  administration in time range)  ondansetron (ZOFRAN) tablet 4 mg (has no administration in time range)    Or  ondansetron (ZOFRAN) injection 4 mg (has no administration in time range)  acetaminophen (TYLENOL) tablet 650 mg (has no administration in time range)    Or  acetaminophen (TYLENOL) suppository 650 mg (has no administration in time range)  HYDROcodone-acetaminophen (NORCO/VICODIN) 5-325 MG per tablet 2 tablet (has no administration in time range)  ALPRAZolam (XANAX) tablet 0.5 mg (has no administration in time range)  hydrALAZINE (APRESOLINE) injection 10 mg (has no administration in time range)  budesonide (PULMICORT) nebulizer solution 0.5 mg (0.5 mg Nebulization Given 07/13/17 2024)  nitroGLYCERIN (NITROGLYN) 2 % ointment 1 inch (1 inch Topical Given 07/13/17 1447)  morphine 4 MG/ML injection 4 mg (4 mg Intravenous Given 07/13/17 1447)  morphine 4 MG/ML injection 4 mg (4 mg Intravenous Given 07/13/17 1629)    Note:  This document was prepared using Dragon voice recognition software and may include unintentional dictation errors.  Nanda Quinton, MD Emergency Medicine   Oluwaseun Cremer, [facility], MD 07/13/17 2112

## 2017-07-13 NOTE — H&P (Signed)
History and Physical  Bethany Wang IWP:[id number] DOB: Sep 03, 1965 DOA: 07/13/2017   PCP: [name], MD   Patient coming from: Home  Chief Complaint: chest pain  HPI:  Bethany Wang is a 52 y.o. female with medical history of coronary artery disease with DES to the RCA in 2013 and LAD in 2014, hypertension, diet-controlled diabetes mellitus, hyperlipidemia presenting with chest pain that started around 8 PM on 07/12/2017 after having an argument with her husband.  She stated that the chest pain is a heaviness that is substernal that started when she got into an argument about cleaning up the house with her husband.  The patient denied any worsening shortness of breath, but complained of some lightheadedness.  Her symptoms persisted through the night and she ended up taking a whole bottle of aspirin 81 mg (#30) and a whole bottle of SL nitro (#30) throughout the night because of continued chest discomfort.  She states that her discomfort has been constant since 8 PM on July 12, 2017.  she continued to have chest pressure in the morning and subsequently went for a walk.  She felt lightheaded after which she decided to call EMS.  The patient states that she has been under a tremendous amount of stress regarding her family.  She states that she wants to have a divorce from her husband.  She denied any syncope, but states that she has been having intermittent dizziness.  Review of the medical record shows that the patient has had some issues with compliance regarding her antihypertensive medications as noted on her last cardiology visit with Dr. Domenic Polite.  When asked regarding her medications, the patient appears to have very poor insight regarding her medications.  In the emergency department, the patient was afebrile hemodynamically stable saturating 100% on room air.  Initial blood pressure was 162/104.  BMP and CBC were essentially unremarkable except for potassium 3.4.  Chest x-ray was  negative for infiltrates or edema.  EKG shows sinus rhythm without any concerning ischemic changes.  Initial troponin was negative.  The patient was given IV morphine and nitroglycerin in the emergency department without relief.  Assessment/Plan: Chest pain in patient with coronary artery disease -Appears mostly atypical by clinical history -Suspect anxiety and uncontrolled hypertension are playing a role -Appreciate cardiology consult -Cycle troponins -EKG without concerning ischemic changes  -05/10/2016 heart catheterization--Progression of first obtuse marginal from 30% to 75-80%. Ostial 75% second diagonal jailed by LAD stent -Continue Imdur, aspirin -UDS  Essential hypertension, uncontrolled -Suspected degree of noncompliance -Patient has poor insight regarding her medications -Continue amlodipine, losartan, atenolol, Imdur  Diet controlled DM2 -check A1C  Anxiety -Continue alprazolam home dose--0.5 mg 3 times daily as needed anxiety -Continue Lexapro -Patient appears to have very pressured speech during my interaction  Hyperlipidemia -Continue Lipitor  Hypothyroidism -Continue Armour Thyroid  Tobacco abuse -Patient has over 50-pack-year history -Currently smoking cigars -I have discussed tobacco cessation with the patient.  I have counseled the patient regarding the negative impacts of continued tobacco use including but not limited to lung cancer, COPD, and cardiovascular disease.  I have discussed alternatives to tobacco and modalities that may help facilitate tobacco cessation including but not limited to biofeedback, hypnosis, and medications.  Total time spent with tobacco counseling was 8 minutes.         Past Medical History:  Diagnosis Date  . Anemia   . Bilateral breast cysts 12/30/2015  . Breast cancer ([place])   .  BV (bacterial vaginosis) 09/09/2015  . Coronary atherosclerosis of native coronary artery    DES to RCA 03/2012, DES to LAD at [facility] 05/2012  .  Cyst of pharynx or nasopharynx    [name]  . Essential hypertension   . Falls    x 3-4 in past year  . GERD (gastroesophageal reflux disease)   . History of breast cancer 09/09/2015  . History of hematuria   . Mixed hyperlipidemia   . PUD (peptic ulcer disease)   . Seizures ([place])   . Shingles 09/09/2015  . Suicide attempt ([place])    3 attempts in remote past  . Type 2 diabetes mellitus ([place])   . Uterine cancer [place])    Past Surgical History:  Procedure Laterality Date  . ABDOMINAL HYSTERECTOMY    . BLADDER SURGERY    . CARDIAC CATHETERIZATION N/A 05/10/2016   Procedure: Left Heart Cath and Coronary Angiography;  Surgeon: Belva Crome, MD;  Location: [facility];  Service: Cardiovascular;  Laterality: N/A;  . COLONOSCOPY  May 2010   Fleishman: normal rectum, internal hemorrhoids, , benign colonic polyp  . COLONOSCOPY N/A 01/05/2017   Procedure: COLONOSCOPY;  Surgeon: Rogene Houston, MD;  [facility];  Service: Endoscopy;  Laterality: N/A;  1:00  . ESOPHAGOGASTRODUODENOSCOPY     2001 Dr. Amedeo Plenty: distal esophagitis, small hiatal hernia,. Dr. Tamala Julian 2006? no records available currently, pt also reports  EGD a few years ago with Dr. Gala Romney, do not have these reports anywhere in medical records  . ESOPHAGOGASTRODUODENOSCOPY  06/02/2011   PRF:FMBWGY pill impaction as described above s/p dilation of a probable cervical esophageal web/bx abnormal esophageal and gastric mucosa. + H.pylori gastritis   . EYE SURGERY     Removed glass  . HAND SURGERY Right   . Left breast lumpectomy     Benign  . LEFT HEART CATHETERIZATION WITH CORONARY ANGIOGRAM N/A 07/03/2014   Procedure: LEFT HEART CATHETERIZATION WITH CORONARY ANGIOGRAM;  Surgeon: Belva Crome, MD;  Location: [facility] CATH [facility];  Service: Cardiovascular;  Laterality: N/A;  . POLYPECTOMY  01/05/2017   Procedure: POLYPECTOMY;  Surgeon: Rogene Houston, MD;  [facility];  Service: Endoscopy;;  colon    . SHOULDER SURGERY Left    Social History:  reports that she quit smoking about 5 years ago. Her smoking use included cigarettes. She started smoking about 34 years ago. She has a 6.40 pack-year smoking history. She has never used smokeless tobacco. She reports that she does not drink alcohol or use drugs.   Family History  Problem Relation Age of Onset  . Colon cancer Paternal Grandfather   . Cancer Father   . Cancer Maternal Uncle   . Alzheimer's disease Paternal Aunt   . Cirrhosis Maternal Uncle   . Cancer Paternal Aunt        lung  . Heart disease Brother   . Heart attack Brother   . Mental illness Daughter   . ADD / ADHD Daughter   . Bipolar disorder Daughter   . Mental illness Son   . ADD / ADHD Son   . Bipolar disorder Son   . Mental illness Son   . ADD / ADHD Son   . Bipolar disorder Son      Allergies  Allergen Reactions  . Bee Venom Anaphylaxis    "throat swelled and had to the hospital" - Yellow jacket sting  . Penicillins Anaphylaxis    Has patient had  a PCN reaction causing immediate rash, facial/tongue/throat swelling, SOB or lightheadedness with hypotension: Yes Has patient had a PCN reaction causing severe rash involving mucus membranes or skin necrosis: Yes Has patient had a PCN reaction that required hospitalization Yes Has patient had a PCN reaction occurring within the last 10 years: Yes If all of the above answers are "NO", then may proceed with Cephalosporin use. Patient states she carries an epi-pen  . Shellfish Allergy Swelling     Prior to Admission medications   Medication Sig Start Date End Date Taking? Authorizing Provider  amLODipine (NORVASC) 5 MG tablet Take 1 tablet by mouth daily.    [provider]  ARMOUR THYROID 30 MG tablet Take 1 tablet by mouth daily. 06/13/17   [provider]  aspirin EC 81 MG tablet Take 1 tablet (81 mg total) by mouth daily. 01/08/17   Rehman, Mechele Dawley, MD  atenolol (TENORMIN) 25 MG tablet  Take 1 tablet (25 mg total) by mouth daily. 05/24/17 08/22/17  Satira Sark, MD  atorvastatin (LIPITOR) 40 MG tablet Take 1 tablet (40 mg total) by mouth daily at 6 PM. 06/16/16   Eber Jones, MD  EPINEPHrine 0.3 mg/0.3 mL IJ SOAJ injection Inject 0.3 mg into the muscle as needed (allergic reaction).     [provider]  escitalopram (LEXAPRO) 20 MG tablet Take 20 mg by mouth daily.  11/03/15   [provider]  fluticasone (FLOVENT HFA) 220 MCG/ACT inhaler Inhale 1 puff into the lungs 2 (two) times daily.    [provider]  isosorbide mononitrate (IMDUR) 30 MG 24 hr tablet Take 3 tablets (90 mg total) by mouth daily. 07/20/16   Satira Sark, MD  lisinopril-hydrochlorothiazide (PRINZIDE,ZESTORETIC) 20-12.5 MG tablet Take 1 tablet by mouth daily.    [provider]  losartan (COZAAR) 25 MG tablet Take 1 tablet (25 mg total) by mouth daily. 05/24/17 08/22/17  Satira Sark, MD  metoprolol tartrate (LOPRESSOR) 25 MG tablet Take 1 tablet by mouth 2 (two) times daily. 06/09/17   [provider]  Multiple Vitamin (MULTIVITAMIN WITH MINERALS) TABS tablet Take 1 tablet by mouth daily.    [provider]  NITROSTAT 0.4 MG SL tablet PLACE 1 TAB UNDER TONGUE EVERY 5 MIN UP TO 3 TIMES AS NEEDED FOR CHEST PAIN, IF NO RELIEF CALL 911. 05/24/17   Herminio Commons, MD  pantoprazole (PROTONIX) 40 MG tablet Take 1 tablet by mouth daily.    [provider]  potassium chloride SA (K-DUR,KLOR-CON) 20 MEQ tablet Take 1 tablet (20 mEq total) by mouth daily. 06/17/16   Eber Jones, MD    Review of Systems:  Constitutional:  No weight loss, night sweats, Fevers, chills, fatigue.  Head&Eyes: No headache.  No vision loss.  No eye pain or scotoma ENT:  No Difficulty swallowing,Tooth/dental problems,Sore throat,  No ear ache, post nasal drip,  Cardio-vascular:  NoOrthopnea, PND, swelling in lower extremities,  palpitations  GI:   No  abdominal pain, nausea, vomiting, diarrhea, loss of appetite, hematochezia, melena, heartburn, indigestion, Resp:  No cough. No coughing up of blood .No wheezing.No chest wall deformity  Skin:  no rash or lesions.  GU:  no dysuria, change in color of urine, no urgency or frequency. No flank pain.  Musculoskeletal:   No decreased range of motion. No back pain.  Psych:  No change in mood or affect.  Neurologic: no dysesthesia, no focal weakness, no vision loss. No syncope  Physical Exam: Vitals:   07/13/17 1500 07/13/17 1600 07/13/17 1630 07/13/17 1700  BP: (!) 142/87 (!) 152/84 (!) 176/99 (!) 143/92  Pulse: (!) 59 (!) 58 (!) 59 (!) 56  Resp: 17 19 14 18   Temp:      TempSrc:      SpO2: 100% 99% 100% 100%  Weight:      Height:       General:  A&O x 3, NAD, nontoxic, pleasant/cooperative Head/Eye: No conjunctival hemorrhage, no icterus, [place]/AT, No nystagmus ENT:  No icterus,  No thrush, good dentition, no pharyngeal exudate Neck:  No masses, no lymphadenpathy, no bruits CV:  RRR, no rub, no gallop, no S3 Lung:  CTAB, good air movement, no wheeze, no rhonchi Abdomen: soft/NT, +BS, nondistended, no peritoneal signs Ext: No cyanosis, No rashes, No petechiae, No lymphangitis, No edema Neuro: CNII-XII intact, strength 4/5 in bilateral upper and lower extremities, no dysmetria  Labs on Admission:  Basic Metabolic Panel: Recent Labs  Lab 07/13/17 1507  NA 144  K 3.4*  CL 107  CO2 28  GLUCOSE 109*  BUN 11  CREATININE 0.81  CALCIUM 9.3   Liver Function Tests: No results for input(s): AST, ALT, ALKPHOS, BILITOT, PROT, ALBUMIN in the last 168 hours. No results for input(s): LIPASE, AMYLASE in the last 168 hours. No results for input(s): AMMONIA in the last 168 hours. CBC: Recent Labs  Lab 07/13/17 1507  WBC 5.8  HGB 12.8  HCT 39.1  MCV 84.6  PLT 212   Coagulation Profile: No results for input(s): INR, PROTIME in the last 168 hours. Cardiac Enzymes: Recent Labs   Lab 07/13/17 1507  TROPONINI <0.03   BNP: Invalid input(s): POCBNP CBG: No results for input(s): GLUCAP in the last 168 hours. Urine analysis: No results found for: COLORURINE, APPEARANCEUR, LABSPEC, PHURINE, GLUCOSEU, HGBUR, BILIRUBINUR, KETONESUR, PROTEINUR, UROBILINOGEN, NITRITE, LEUKOCYTESUR Sepsis Labs: @LABRCNTIP (procalcitonin:4,lacticidven:4) )No results found for this or any previous visit (from the past 240 hour(s)).   Radiological Exams on Admission: Dg Chest 2 View  Result Date: 07/13/2017 CLINICAL DATA:  Left-sided chest pain beginning at 8 p.m. last night. EXAM: CHEST - 2 VIEW COMPARISON:  One-view chest x-ray 06/14/2016 FINDINGS: The heart size is normal. Interstitial coarsening is stable. There is no edema or effusion. No focal airspace disease is present. The visualized soft tissues and bony thorax are unremarkable. IMPRESSION: No active cardiopulmonary disease. Electronically Signed   By: San Morelle M.D.   On: 07/13/2017 15:26    EKG: Independently reviewed.  Sinus rhythm, nonspecific T wave change    Time spent:60 minutes Code Status:   FULL Family Communication:  No Family at bedside Disposition Plan: expect 1-2 day hospitalization Consults called: cardiology DVT Prophylaxis: [place] Lovenox  Orson Eva, DO  [facility] Pager [phone]  If 7PM-7AM, please contact night-coverage [email or web address] Password [email or web address] 07/13/2017, 5:04 PM

## 2017-07-14 ENCOUNTER — Encounter (HOSPITAL_COMMUNITY): Payer: Self-pay

## 2017-07-14 DIAGNOSIS — Z720 Tobacco use: Secondary | ICD-10-CM | POA: Diagnosis not present

## 2017-07-14 DIAGNOSIS — I10 Essential (primary) hypertension: Secondary | ICD-10-CM

## 2017-07-14 DIAGNOSIS — I25119 Atherosclerotic heart disease of native coronary artery with unspecified angina pectoris: Secondary | ICD-10-CM | POA: Diagnosis not present

## 2017-07-14 DIAGNOSIS — R0789 Other chest pain: Secondary | ICD-10-CM | POA: Diagnosis not present

## 2017-07-14 DIAGNOSIS — E782 Mixed hyperlipidemia: Secondary | ICD-10-CM

## 2017-07-14 DIAGNOSIS — R079 Chest pain, unspecified: Secondary | ICD-10-CM

## 2017-07-14 DIAGNOSIS — R072 Precordial pain: Secondary | ICD-10-CM | POA: Diagnosis not present

## 2017-07-14 LAB — BASIC METABOLIC PANEL
ANION GAP: 11 (ref 5–15)
BUN: 13 mg/dL (ref 6–20)
CO2: 24 mmol/L (ref 22–32)
Calcium: 9.3 mg/dL (ref 8.9–10.3)
Chloride: 107 mmol/L (ref 101–111)
Creatinine, Ser: 0.81 mg/dL (ref 0.44–1.00)
Glucose, Bld: 108 mg/dL — ABNORMAL HIGH (ref 65–99)
POTASSIUM: 3.6 mmol/L (ref 3.5–5.1)
SODIUM: 142 mmol/L (ref 135–145)

## 2017-07-14 LAB — TROPONIN I

## 2017-07-14 LAB — MAGNESIUM: Magnesium: 2.2 mg/dL (ref 1.7–2.4)

## 2017-07-14 LAB — LIPID PANEL
Cholesterol: 182 mg/dL (ref 0–200)
HDL: 47 mg/dL (ref >40)
LDL Cholesterol: 91 mg/dL (ref 0–99)
Total CHOL/HDL Ratio: 3.9 RATIO
Triglycerides: 218 mg/dL — ABNORMAL HIGH (ref <150)
VLDL: 44 mg/dL — ABNORMAL HIGH (ref 0–40)

## 2017-07-14 LAB — HEMOGLOBIN A1C
HEMOGLOBIN A1C: 5.2 % (ref 4.8–5.6)
MEAN PLASMA GLUCOSE: 102.54 mg/dL

## 2017-07-14 NOTE — Progress Notes (Signed)
Progress Note  Patient Name: Bethany Wang Date of Encounter: 07/14/2017  Primary Cardiologist: Dr. Satira Sark  Subjective   Feels weak and tired, chest pain has improved.  Admits that she has been under a lot of psychosocial stress.  Inpatient Medications    Scheduled Meds: . amLODipine  5 mg Oral Daily  . aspirin EC  81 mg Oral Daily  . atenolol  25 mg Oral Daily  . atorvastatin  40 mg Oral q1800  . budesonide (PULMICORT) nebulizer solution  0.5 mg Nebulization BID  . enoxaparin (LOVENOX) injection  40 mg Subcutaneous Q24H  . escitalopram  20 mg Oral Daily  . isosorbide mononitrate  90 mg Oral Daily  . losartan  25 mg Oral Daily  . multivitamin with minerals  1 tablet Oral Daily  . potassium chloride SA  20 mEq Oral Daily  . thyroid  30 mg Oral Daily    PRN Meds: acetaminophen **OR** acetaminophen, ALPRAZolam, hydrALAZINE, HYDROcodone-acetaminophen, ondansetron **OR** ondansetron (ZOFRAN) IV   Vital Signs    Vitals:   07/13/17 2027 07/13/17 2114 07/14/17 0507 07/14/17 0804  BP:  (!) 151/86 127/73   Pulse:  (!) 59 (!) 51   Resp:  17 16   Temp:  97.7 F (36.5 C) 97.7 F (36.5 C)   TempSrc:  Oral    SpO2: 96% 98% 98% 95%  Weight:  156 lb 1.6 oz (70.8 kg)    Height:  5\' 4"  (1.626 m)      Intake/Output Summary (Last 24 hours) at 07/14/2017 0806 Last data filed at 07/13/2017 2130 Gross per 24 hour  Intake 240 ml  Output -  Net 240 ml   Filed Weights   07/13/17 1415 07/13/17 2114  Weight: 151 lb (68.5 kg) 156 lb 1.6 oz (70.8 kg)    Telemetry    Sinus bradycardia.  Personally reviewed.  ECG    Tracing from 07/14/2017 shows sinus rhythm with increased voltage.  Personally reviewed.  Physical Exam   GEN: No acute distress.   Neck: No JVD. Cardiac: RRR, no gallop.  Respiratory: Nonlabored. Clear to auscultation bilaterally. GI: Soft, nontender, bowel sounds present. MS: No edema; No deformity. Neuro:  Nonfocal. Psych: Alert and oriented x 3.  Normal affect.  Labs    Chemistry Recent Labs  Lab 07/13/17 1507 07/14/17 0302  NA 144 142  K 3.4* 3.6  CL 107 107  CO2 28 24  GLUCOSE 109* 108*  BUN 11 13  CREATININE 0.81 0.81  CALCIUM 9.3 9.3  GFRNONAA >60 >60  GFRAA >60 >60  ANIONGAP 9 11     Hematology Recent Labs  Lab 07/13/17 1507  WBC 5.8  RBC 4.62  HGB 12.8  HCT 39.1  MCV 84.6  MCH 27.7  MCHC 32.7  RDW 14.0  PLT 212    Cardiac Enzymes Recent Labs  Lab 07/13/17 1507 07/13/17 2121 07/14/17 0302  TROPONINI <0.03 <0.03 <0.03   No results for input(s): TROPIPOC in the last 168 hours.    Radiology    Dg Chest 2 View  Result Date: 07/13/2017 CLINICAL DATA:  Left-sided chest pain beginning at 8 p.m. last night. EXAM: CHEST - 2 VIEW COMPARISON:  One-view chest x-ray 06/14/2016 FINDINGS: The heart size is normal. Interstitial coarsening is stable. There is no edema or effusion. No focal airspace disease is present. The visualized soft tissues and bony thorax are unremarkable. IMPRESSION: No active cardiopulmonary disease. Electronically Signed   By: Wynetta Fines.D.  On: 07/13/2017 15:26    Cardiac Studies   None during this hospitalization.  Patient Profile     52 y.o. 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  She is admitted with prolonged chest pain in the setting of emotional upset and anxiety.  Assessment & Plan    1.  Prolonged episode of chest pain in the setting of emotional upset and anxiety.  ECG without acute ischemic changes and troponin I levels are negative for ACS.  2.  CAD with history of DES to the RCA in 2013 and DES to the LAD in 2014.  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   Most recent cardiac catheterization was in January 2018.  3.  Essential hypertension.  4.  Type 2 diabetes mellitus.  Discussed symptoms with patient this morning.  At this point would recommend continuing medical therapy for treatment of ischemic heart disease.  Will order diet.  Current regimen includes aspirin, Norvasc, atenolol, Lipitor, Imdur, and Cozaar.  Addition of Ranexa could be considered, but may well be cost prohibitive.  No further inpatient cardiac testing is planned.  We should see her back in the office within the next 7-10 days.  Signed, [name], MD  07/14/2017, 8:06 AM

## 2017-07-14 NOTE — Progress Notes (Signed)
Pt arrived to 313 from ED via stretcher. Oriented to room and equipment. PAS and skin swarm completed. No skin issues noted. C/o 8/10 chest pain. Nitro patch in place. Will check for available meds. No other needs voiced at this time. Will continue to monitor.

## 2017-07-14 NOTE — Discharge Summary (Signed)
Physician Discharge Summary  TIMI REESER XFG:[id number] DOB: 08-23-1965 DOA: 07/13/2017  PCP: [name], MD  Admit date: 07/13/2017 Discharge date: 07/14/2017  Admitted From: home Disposition:  home  Recommendations for Outpatient Follow-up:  1. Follow up with PCP in 1-2 weeks 2. Please obtain BMP/CBC in one week 3. Follow up with cardiology in 7-10 days  Home Health: Equipment/Devices:  Discharge Condition: stable CODE STATUS:full code Diet recommendation: Heart Healthy    Brief/Interim Summary: 52 y/o female with history of CAD, HTN and diet controlled DM, presented to the hospital with chest pain. Symptoms occurred in the setting of emotional stress. She ruled out for ACS with negative cardiac markers. She did not have any acute EKG changes. The following day, she did not have any recurrence of symptoms. She was seen by cardiology and no further inpatient cardiac testing was recommended. Consideration was given to start the patient on ranexa, although this may have been cost prohibitive. This will be further addressed in the clinic. She will follow up with cardiology in the next 7-10 days. No significant changes to medications made  Discharge Diagnoses:  Active Problems:   Tobacco abuse   Chest pain   Nonspecific chest pain   Essential hypertension   Mixed hyperlipidemia    Discharge Instructions  Discharge Instructions    Diet - low sodium heart healthy   Complete by:  As directed    Increase activity slowly   Complete by:  As directed      Allergies as of 07/14/2017      Reactions   Bee Venom Anaphylaxis   "throat swelled and had to the hospital" - Yellow jacket sting   Penicillins Anaphylaxis   Has patient had a PCN reaction causing immediate rash, facial/tongue/throat swelling, SOB or lightheadedness with hypotension: Yes Has patient had a PCN reaction causing severe rash involving mucus membranes or skin necrosis: Yes Has patient had a PCN reaction that  required hospitalization Yes Has patient had a PCN reaction occurring within the last 10 years: Yes If all of the above answers are "NO", then may proceed with Cephalosporin use. Patient states she carries an epi-pen   Shellfish Allergy Swelling      Medication List    STOP taking these medications   atenolol 25 MG tablet Commonly known as:  TENORMIN   lisinopril-hydrochlorothiazide 20-12.5 MG tablet Commonly known as:  PRINZIDE,ZESTORETIC     TAKE these medications   amLODipine 5 MG tablet Commonly known as:  NORVASC Take 1 tablet by mouth daily.   ARMOUR THYROID 30 MG tablet Generic drug:  thyroid Take 1 tablet by mouth daily.   aspirin EC 81 MG tablet Take 1 tablet (81 mg total) by mouth daily.   atorvastatin 40 MG tablet Commonly known as:  LIPITOR Take 1 tablet (40 mg total) by mouth daily at 6 PM.   EPINEPHrine 0.3 mg/0.3 mL Soaj injection Commonly known as:  EPI-PEN Inject 0.3 mg into the muscle as needed (allergic reaction).   escitalopram 20 MG tablet Commonly known as:  LEXAPRO Take 20 mg by mouth daily.   fluticasone 220 MCG/ACT inhaler Commonly known as:  FLOVENT HFA Inhale 1 puff into the lungs 2 (two) times daily.   isosorbide mononitrate 30 MG 24 hr tablet Commonly known as:  IMDUR Take 3 tablets (90 mg total) by mouth daily.   losartan 25 MG tablet Commonly known as:  COZAAR Take 1 tablet (25 mg total) by mouth daily.   metoprolol tartrate  25 MG tablet Commonly known as:  LOPRESSOR Take 1 tablet by mouth 2 (two) times daily.   multivitamin with minerals Tabs tablet Take 1 tablet by mouth daily.   NITROSTAT 0.4 MG SL tablet Generic drug:  nitroGLYCERIN PLACE 1 TAB UNDER TONGUE EVERY 5 MIN UP TO 3 TIMES AS NEEDED FOR CHEST PAIN, IF NO RELIEF CALL 911.   pantoprazole 40 MG tablet Commonly known as:  PROTONIX Take 1 tablet by mouth daily.   potassium chloride SA 20 MEQ tablet Commonly known as:  K-DUR,KLOR-CON Take 1 tablet (20 mEq  total) by mouth daily.   XANAX 0.5 MG tablet Generic drug:  ALPRAZolam Take 0.5 mg by mouth 2 (two) times daily.      Follow-up Information    Erma Heritage, PA-C Follow up on 07/28/2017.   Specialties:  Physician Assistant, Cardiology Why:  [place] on 07/28/2017 at 3:30 PM with [name], PA-C (works with Dr. Domenic Polite) Contact information: [place] [address] [phone]          Allergies  Allergen Reactions  . Bee Venom Anaphylaxis    "throat swelled and had to the hospital" - Yellow jacket sting  . Penicillins Anaphylaxis    Has patient had a PCN reaction causing immediate rash, facial/tongue/throat swelling, SOB or lightheadedness with hypotension: Yes Has patient had a PCN reaction causing severe rash involving mucus membranes or skin necrosis: Yes Has patient had a PCN reaction that required hospitalization Yes Has patient had a PCN reaction occurring within the last 10 years: Yes If all of the above answers are "NO", then may proceed with Cephalosporin use. Patient states she carries an epi-pen  . Shellfish Allergy Swelling    Consultations:  cardiology   Procedures/Studies: Dg Chest 2 View  Result Date: 07/13/2017 CLINICAL DATA:  Left-sided chest pain beginning at 8 p.m. last night. EXAM: CHEST - 2 VIEW COMPARISON:  One-view chest x-ray 06/14/2016 FINDINGS: The heart size is normal. Interstitial coarsening is stable. There is no edema or effusion. No focal airspace disease is present. The visualized soft tissues and bony thorax are unremarkable. IMPRESSION: No active cardiopulmonary disease. Electronically Signed   By: San Morelle M.D.   On: 07/13/2017 15:26       Subjective: Patient is very upset that she missed breakfast this morning. She is not having any chest pain. A diet has been ordered for her and she was offered crackers, but this made her increasingly upset. She wants to leave the hospital to  eat. She does not want to wait for her meal tray  Discharge Exam: Vitals:   07/14/17 0507 07/14/17 0804  BP: 127/73   Pulse: (!) 51   Resp: 16   Temp: 97.7 F (36.5 C)   SpO2: 98% 95%   Vitals:   07/13/17 2027 07/13/17 2114 07/14/17 0507 07/14/17 0804  BP:  (!) 151/86 127/73   Pulse:  (!) 59 (!) 51   Resp:  17 16   Temp:  97.7 F (36.5 C) 97.7 F (36.5 C)   TempSrc:  Oral    SpO2: 96% 98% 98% 95%  Weight:  70.8 kg (156 lb 1.6 oz)    Height:  5\' 4"  (1.626 m)      General: Pt is alert, awake, not in acute distress Cardiovascular: RRR, S1/S2 +, no rubs, no gallops Respiratory: CTA bilaterally, no wheezing, no rhonchi Abdominal: Soft, NT, ND, bowel sounds + Extremities: no edema, no cyanosis  The results of significant diagnostics from this hospitalization (including imaging, microbiology, ancillary and laboratory) are listed below for reference.     Microbiology: No results found for this or any previous visit (from the past 240 hour(s)).   Labs: BNP (last 3 results) No results for input(s): BNP in the last 8760 hours. Basic Metabolic Panel: Recent Labs  Lab 07/13/17 1507 07/14/17 0302  NA 144 142  K 3.4* 3.6  CL 107 107  CO2 28 24  GLUCOSE 109* 108*  BUN 11 13  CREATININE 0.81 0.81  CALCIUM 9.3 9.3  MG  --  2.2   Liver Function Tests: No results for input(s): AST, ALT, ALKPHOS, BILITOT, PROT, ALBUMIN in the last 168 hours. No results for input(s): LIPASE, AMYLASE in the last 168 hours. No results for input(s): AMMONIA in the last 168 hours. CBC: Recent Labs  Lab 07/13/17 1507  WBC 5.8  HGB 12.8  HCT 39.1  MCV 84.6  PLT 212   Cardiac Enzymes: Recent Labs  Lab 07/13/17 1507 07/13/17 2121 07/14/17 0302  TROPONINI <0.03 <0.03 <0.03   BNP: Invalid input(s): POCBNP CBG: No results for input(s): GLUCAP in the last 168 hours. D-Dimer No results for input(s): DDIMER in the last 72 hours. Hgb A1c No results for input(s): HGBA1C in the last  72 hours. Lipid Profile Recent Labs    07/14/17 0302  CHOL 182  HDL 47  LDLCALC 91  TRIG 218*  CHOLHDL 3.9   Thyroid function studies No results for input(s): TSH, T4TOTAL, T3FREE, THYROIDAB in the last 72 hours.  Invalid input(s): FREET3 Anemia work up No results for input(s): VITAMINB12, FOLATE, FERRITIN, TIBC, IRON, RETICCTPCT in the last 72 hours. Urinalysis No results found for: COLORURINE, APPEARANCEUR, LABSPEC, [place], GLUCOSEU, HGBUR, BILIRUBINUR, KETONESUR, PROTEINUR, UROBILINOGEN, NITRITE, LEUKOCYTESUR Sepsis Labs Invalid input(s): PROCALCITONIN,  WBC,  LACTICIDVEN Microbiology No results found for this or any previous visit (from the past 240 hour(s)).   Time coordinating discharge: Over 30 minutes  SIGNED:   Kathie Dike, MD  [facility] 07/14/2017, 10:30 AM Pager   If 7PM-7AM, please contact night-coverage [email or web address] Password [email or web address]

## 2017-07-14 NOTE — Progress Notes (Signed)
Patient is to be discharged home and in stable condition. Patient's IV and telemetry removed, WNL. Patient given discharge instructions and verbalized understanding. Patient denies the need for a wheelchair escort out, requesting to ambulate out.  Celestia Khat, RN

## 2017-07-15 LAB — HIV ANTIBODY (ROUTINE TESTING W REFLEX): HIV Screen 4th Generation wRfx: NONREACTIVE

## 2017-07-21 DIAGNOSIS — R5383 Other fatigue: Secondary | ICD-10-CM | POA: Diagnosis not present

## 2017-07-27 DIAGNOSIS — E119 Type 2 diabetes mellitus without complications: Secondary | ICD-10-CM | POA: Diagnosis not present

## 2017-07-27 NOTE — Progress Notes (Signed)
Cardiology Office Note    Date:  07/28/2017   ID:  Bethany Wang, DOB May 11, 1965, MRN [id number]  PCP:  [name], MD  Cardiologist: [name], MD    Chief Complaint  Patient presents with  . Hospitalization Follow-up    History of Present Illness:    Bethany Wang is a 52 y.o. female with past medical history of CAD (s/p DES to RCA in 2013 and DES to LAD in 2014, cath in 04/2016 showing patent LAD stent with D2 jailed by LAD stent and CTO of RCA with left to right collaterals present), HTN, HLD, and Type 2 DM who presents to the office today for hospital follow-up.  She was recently admitted to [facility] from 4/3 - 07/14/2017 for evaluation of chest pain which started the evening prior her arrival. Noted the symptoms during a family argument and pain persisted for over 24 hours. Her EKG showed no acute ischemic changes and cyclic troponin values remained negative. Her symptoms were overall felt to be atypical for a cardiac etiology. She was continued on her current medication regimen at the time of discharge and follow-up was arranged.   In talking with the patient today, she reports intermittent episodes of chest pain since hospital discharge but says these typically spontaneously resolve within a few seconds. Reports she has been having symptoms similar to this ever since her initial stent placement in 2013. She denies any exertional chest pain or dyspnea on exertion.  Lisinopril-HCTZ had been discontinued by her PCP prior to admission due to recent recalls and since that timeframe she has noticed worsening swelling along her lower extremities. Does also note episodes of orthopnea but denies any PND or abdominal distention. She does not weigh herself regularly and is unsure of any recent variations on her home scales.  Past Medical History:  Diagnosis Date  . Anemia   . Bilateral breast cysts 12/30/2015  . Breast cancer ([place])   . BV (bacterial vaginosis) 09/09/2015    . Coronary atherosclerosis of native coronary artery    a. s/p DES to RCA in 2013 b. DES to LAD in 2014 c. cath in 04/2016 showing patent LAD stent with D2 jailed by LAD stent and CTO of RCA with left to right collaterals present  . Cyst of pharynx or nasopharynx    [name]  . Essential hypertension   . Falls    x 3-4 in past year  . GERD (gastroesophageal reflux disease)   . History of breast cancer 09/09/2015  . History of hematuria   . Mixed hyperlipidemia   . PUD (peptic ulcer disease)   . Seizures ([place])   . Shingles 09/09/2015  . Suicide attempt ([place])    3 attempts in remote past  . Type 2 diabetes mellitus ([place])   . Uterine cancer [facility])     Past Surgical History:  Procedure Laterality Date  . ABDOMINAL HYSTERECTOMY    . BLADDER SURGERY    . CARDIAC CATHETERIZATION N/A 05/10/2016   Procedure: Left Heart Cath and Coronary Angiography;  Surgeon: Belva Crome, MD;  Location: [facility];  Service: Cardiovascular;  Laterality: N/A;  . COLONOSCOPY  May 2010   Fleishman: normal rectum, internal hemorrhoids, , benign colonic polyp  . COLONOSCOPY N/A 01/05/2017   Procedure: COLONOSCOPY;  Surgeon: Rogene Houston, MD;  Location: [facility];  Service: Endoscopy;  Laterality: N/A;  1:00  . ESOPHAGOGASTRODUODENOSCOPY     2001 Dr. Amedeo Plenty: distal  esophagitis, small hiatal hernia,. Dr. Tamala Julian 2006? no records available currently, pt also reports  EGD a few years ago with Dr. Gala Romney, do not have these reports anywhere in medical records  . ESOPHAGOGASTRODUODENOSCOPY  06/02/2011   E[facility]:DJSHFW pill impaction as described above s/p dilation of a probable cervical esophageal web/bx abnormal esophageal and gastric mucosa. + H.pylori gastritis   . EYE SURGERY     Removed glass  . HAND SURGERY Right   . Left breast lumpectomy     Benign  . LEFT HEART CATHETERIZATION WITH CORONARY ANGIOGRAM N/A 07/03/2014   Procedure: LEFT HEART CATHETERIZATION WITH CORONARY ANGIOGRAM;   Surgeon: Belva Crome, MD;  Location: [facility] CATH LAB;  Service: Cardiovascular;  Laterality: N/A;  . POLYPECTOMY  01/05/2017   Procedure: POLYPECTOMY;  Surgeon: Rogene Houston, MD;  Location: [facility];  Service: Endoscopy;;  colon  . SHOULDER SURGERY Left     Current Medications: Outpatient Medications Prior to Visit  Medication Sig Dispense Refill  . ALPRAZolam (XANAX) 0.5 MG tablet Take 0.5 mg by mouth 2 (two) times daily.    [name] amLODipine (NORVASC) 5 MG tablet Take 1 tablet by mouth daily.    Francia Greaves THYROID 30 MG tablet Take 1 tablet by mouth daily.    [name] aspirin EC 81 MG tablet Take 1 tablet (81 mg total) by mouth daily.    [name] atorvastatin (LIPITOR) 40 MG tablet Take 1 tablet (40 mg total) by mouth daily at 6 PM. 30 tablet 0  . EPINEPHrine 0.3 mg/0.3 mL IJ SOAJ injection Inject 0.3 mg into the muscle as needed (allergic reaction).     [name] escitalopram (LEXAPRO) 20 MG tablet Take 20 mg by mouth daily.     . fluticasone (FLOVENT HFA) 220 MCG/ACT inhaler Inhale 1 puff into the lungs 2 (two) times daily.    . isosorbide mononitrate (IMDUR) 30 MG 24 hr tablet Take 3 tablets (90 mg total) by mouth daily. 90 tablet 6  . losartan (COZAAR) 25 MG tablet Take 1 tablet (25 mg total) by mouth daily. 90 tablet 3  . metoprolol tartrate (LOPRESSOR) 25 MG tablet Take 1 tablet by mouth 2 (two) times daily.    . Multiple Vitamin (MULTIVITAMIN WITH MINERALS) TABS tablet Take 1 tablet by mouth daily.    [name] NITROSTAT 0.4 MG SL tablet PLACE 1 TAB UNDER TONGUE EVERY 5 MIN UP TO 3 TIMES AS NEEDED FOR CHEST PAIN, IF NO RELIEF CALL 911. 25 tablet 0  . pantoprazole (PROTONIX) 40 MG tablet Take 1 tablet by mouth daily.    . potassium chloride SA (K-DUR,KLOR-CON) 20 MEQ tablet Take 1 tablet (20 mEq total) by mouth daily. 30 tablet 0   No facility-administered medications prior to visit.      Allergies:   Bee venom; Penicillins; and Shellfish allergy   Social History   Socioeconomic History  . Marital status:  Married    Spouse name: Not on file  . Number of children: 3  . Years of education: Assoc  . Highest education level: Not on file  Occupational History  . Occupation: Retired    Comment: disability  Social Needs  . Financial resource strain: Not on file  . Food insecurity:    Worry: Not on file    Inability: Not on file  . Transportation needs:    Medical: Not on file    Non-medical: Not on file  Tobacco Use  . Smoking status: Former Smoker    Packs/day: 0.20  Years: 32.00    Pack years: 6.40    Types: Cigarettes    Start date: 07/05/1983    Last attempt to quit: 04/03/2012    Years since quitting: 5.3  . Smokeless tobacco: Never Used  Substance and Sexual Activity  . Alcohol use: No    Alcohol/week: 0.0 oz  . Drug use: No  . Sexual activity: Yes    Birth control/protection: Surgical    Comment: hyst  Lifestyle  . Physical activity:    Days per week: Not on file    Minutes per session: Not on file  . Stress: Not on file  Relationships  . Social connections:    Talks on phone: Not on file    Gets together: Not on file    Attends religious service: Not on file    Active member of club or organization: Not on file    Attends meetings of clubs or organizations: Not on file    Relationship status: Not on file  Other Topics Concern  . Not on file  Social History Narrative   Patient lives at home with her fiance.   Caffeine Use: 3 cups daily     Family History:  The patient's family history includes ADD / ADHD in her daughter, son, and son; Alzheimer's disease in her paternal aunt; Bipolar disorder in her daughter, son, and son; Cancer in her father, maternal uncle, and paternal aunt; Cirrhosis in her maternal uncle; Colon cancer in her paternal grandfather; Heart attack in her brother; Heart disease in her brother; Mental illness in her daughter, son, and son.   Review of Systems:   Please see the history of present illness.     General:  No chills, fever, night  sweats or weight changes.  Cardiovascular:  No chest pain, dyspnea on exertion, orthopnea, palpitations, paroxysmal nocturnal dyspnea. Positive for lower extremity edema.  Dermatological: No rash, lesions/masses Respiratory: No cough, dyspnea Urologic: No hematuria, dysuria Abdominal:   No nausea, vomiting, diarrhea, bright red blood per rectum, melena, or hematemesis Neurologic:  No visual changes, wkns, changes in mental status. All other systems reviewed and are otherwise negative except as noted above.   Physical Exam:    VS:  BP (!) 146/84   Pulse (!) 54   Ht 5\' 4"  (1.626 m)   Wt 158 lb (71.7 kg)   SpO2 98%   BMI 27.12 kg/m    General: Well developed, well nourished [place] American female appearing in no acute distress. Head: Normocephalic, atraumatic, sclera non-icteric, no xanthomas, nares are without discharge.  Neck: No carotid bruits. JVD not elevated.  Lungs: Respirations regular and unlabored, without wheezes or rales.  Heart: Regular rate and rhythm. No S3 or S4.  No murmur, no rubs, or gallops appreciated. Abdomen: Soft, non-tender, non-distended with normoactive bowel sounds. No hepatomegaly. No rebound/guarding. No obvious abdominal masses. Msk:  Strength and tone appear normal for age. No joint deformities or effusions. Extremities: No clubbing or cyanosis. Trace lower extremity edema.  Distal pedal pulses are 2+ bilaterally. Neuro: Alert and oriented X 3. Moves all extremities spontaneously. No focal deficits noted. Psych:  Responds to questions appropriately with a normal affect. Skin: No rashes or lesions noted  Wt Readings from Last 3 Encounters:  07/28/17 158 lb (71.7 kg)  07/13/17 156 lb 1.6 oz (70.8 kg)  05/24/17 156 lb (70.8 kg)    Studies/Labs Reviewed:   EKG:  EKG is not ordered today.   Recent Labs: 07/13/2017: Hemoglobin 12.8; Platelets 212  07/14/2017: BUN 13; Creatinine, Ser 0.81; Magnesium 2.2; Potassium 3.6; Sodium 142   Lipid Panel      Component Value Date/Time   CHOL 182 07/14/2017 0302   TRIG 218 (H) 07/14/2017 0302   HDL 47 07/14/2017 0302   CHOLHDL 3.9 07/14/2017 0302   VLDL 44 (H) 07/14/2017 0302   LDLCALC 91 07/14/2017 0302    Additional studies/ records that were reviewed today include:   Catheterization: 04/2016  Widely patent LAD stent. Ostial 75% second diagonal jailed by LAD stent.  Totally occluded right coronary within the previously placed stent. The right fills by left to right collaterals.  Progression of first obtuse marginal from 30% to 75-80%. The vessel caliber is in the 1.5-2 mm range and is probably best treated with medical therapy.  Widely patent left main  Normal left ventricular function.EF estimated to be 60%. EDP is 14.  RECOMMENDATIONS:  Suspect angina is coming either from the first obtuse marginal, the second diagonal which is jailed, or from the collateralized right coronary.  Intensify medical therapy. ? Add Ranexa.  Intensify risk factor modification including LDL cholesterol less than 70.  Consider CTO PCI of the RCA if symptoms become refractory.  Consider PCI on the small to moderate size first obtuse marginal, however this vessel would possibly be oversized by our smallest stent.  Assessment:    1. Coronary artery disease involving native coronary artery of native heart with angina pectoris ([place])   2. Essential hypertension   3. Mixed hyperlipidemia   4. Bilateral lower extremity edema      Plan:   In order of problems listed above:  1. CAD - s/p DES to RCA in 2013 and DES to LAD in 2014 with most recent catheterization in 04/2016 showing patent LAD stent with D2 jailed by LAD stent and CTO of RCA with left to right collaterals present.  - recently admitted for evaluation of chest pain which was felt to be atypical for a cardiac etiology. EKG showed no acute ischemic changes and cyclic troponin values remained negative. - she denies any recurrent symptoms  similar to what led to admission. No exertional chest pain and breathing has been at baseline.  - continue ASA, BB, and statin therapy.   2. HTN - BP is slightly elevated at 146/84 during today's visit.  - continue Amlodipine, Imdur, Losartan, and Lopressor at current dosing. Will restart HCTZ as outlined below in the setting of her edema. If she tolerates this well, can consolidate Losartan and HCTZ to a single tablet.   3. HLD - followed by PCP. Remains on Atorvastatin 40mg  daily.   4. Lower Extremity Edema - reports worsening edema since Lisinopril-HCTZ was discontinued due to the recent recall. Was switched to Losartan alone at that time. Only has trace edema on examination today. Will restart HCTZ at prior dosing of 12.5mg  daily. Can further titrate to 25mg  daily if needed. Will need a repeat BMET at the time of her follow-up visit (creatinine stable at 0.81 on 07/14/2017).    Medication Adjustments/Labs and Tests Ordered: Current medicines are reviewed at length with the patient today.  Concerns regarding medicines are outlined above.  Medication changes, Labs and Tests ordered today are listed in the Patient Instructions below. Patient Instructions  Medication Instructions:  Restart HCTZ 12.5 mg daily   Labwork: none  Testing/Procedures: none  Follow-Up: Your physician recommends that you schedule a follow-up appointment on May 23 with DR. MCDOWELL   Any Other Special Instructions Will Be  Listed Below (If Applicable).  If you need a refill on your cardiac medications before your next appointment, please call your pharmacy.    Signed, Erma Heritage, PA-C  07/28/2017 7:14 PM    [facility] [address] [place], [place] [address] Phone: [phone]

## 2017-07-28 ENCOUNTER — Ambulatory Visit (INDEPENDENT_AMBULATORY_CARE_PROVIDER_SITE_OTHER): Payer: Medicare HMO | Admitting: Student

## 2017-07-28 ENCOUNTER — Encounter: Payer: Self-pay | Admitting: Student

## 2017-07-28 VITALS — BP 146/84 | HR 54 | Ht 64.0 in | Wt 158.0 lb

## 2017-07-28 DIAGNOSIS — I25119 Atherosclerotic heart disease of native coronary artery with unspecified angina pectoris: Secondary | ICD-10-CM | POA: Diagnosis not present

## 2017-07-28 DIAGNOSIS — R600 Localized edema: Secondary | ICD-10-CM | POA: Diagnosis not present

## 2017-07-28 DIAGNOSIS — E782 Mixed hyperlipidemia: Secondary | ICD-10-CM

## 2017-07-28 DIAGNOSIS — I10 Essential (primary) hypertension: Secondary | ICD-10-CM

## 2017-07-28 MED ORDER — HYDROCHLOROTHIAZIDE 12.5 MG PO CAPS: 12.5000 mg | ORAL_CAPSULE | Freq: Every day | ORAL | 3 refills | Status: DC

## 2017-07-28 NOTE — Patient Instructions (Signed)
Medication Instructions:  Restart HCTZ 12.5 mg daily   Labwork: none  Testing/Procedures: none  Follow-Up: Your physician recommends that you schedule a follow-up appointment on May 23 with DR. MCDOWELL    Any Other Special Instructions Will Be Listed Below (If Applicable).     If you need a refill on your cardiac medications before your next appointment, please call your pharmacy.

## 2017-08-02 DIAGNOSIS — H3530 Unspecified macular degeneration: Secondary | ICD-10-CM | POA: Diagnosis not present

## 2017-08-02 DIAGNOSIS — G40802 Other epilepsy, not intractable, without status epilepticus: Secondary | ICD-10-CM | POA: Diagnosis not present

## 2017-08-02 DIAGNOSIS — Z993 Dependence on wheelchair: Secondary | ICD-10-CM | POA: Diagnosis not present

## 2017-08-02 DIAGNOSIS — G8929 Other chronic pain: Secondary | ICD-10-CM | POA: Diagnosis not present

## 2017-08-02 DIAGNOSIS — M25552 Pain in left hip: Secondary | ICD-10-CM | POA: Diagnosis not present

## 2017-08-02 DIAGNOSIS — M79605 Pain in left leg: Secondary | ICD-10-CM | POA: Diagnosis not present

## 2017-08-02 DIAGNOSIS — M539 Dorsopathy, unspecified: Secondary | ICD-10-CM | POA: Diagnosis not present

## 2017-08-26 DIAGNOSIS — E119 Type 2 diabetes mellitus without complications: Secondary | ICD-10-CM | POA: Diagnosis not present

## 2017-08-31 NOTE — Progress Notes (Deleted)
Cardiology Office Note  Date: 08/31/2017   ID: Bethany Wang, DOB 07-26-1965, MRN [id number]  PCP: [name], MD  Primary Cardiologist: [name], MD   No chief complaint on file.   History of Present Illness: Bethany Wang is a 52 y.o. female last seen by Ms. Strader PA-C in April.  HCTZ was resumed at the last office visit to help with leg edema.  She is due for follow-up BMET.   Past Medical History:  Diagnosis Date  . Anemia   . Bilateral breast cysts 12/30/2015  . Breast cancer ([place])   . BV (bacterial vaginosis) 09/09/2015  . Coronary atherosclerosis of native coronary artery    a. s/p DES to RCA in 2013 b. DES to LAD in 2014 c. cath in 04/2016 showing patent LAD stent with D2 jailed by LAD stent and CTO of RCA with left to right collaterals present  . Cyst of pharynx or nasopharynx    [name]  . Essential hypertension   . Falls    x 3-4 in past year  . GERD (gastroesophageal reflux disease)   . History of breast cancer 09/09/2015  . History of hematuria   . Mixed hyperlipidemia   . PUD (peptic ulcer disease)   . Seizures ([place])   . Shingles 09/09/2015  . Suicide attempt ([place])    3 attempts in remote past  . Type 2 diabetes mellitus ([place])   . Uterine cancer [facility])     Past Surgical History:  Procedure Laterality Date  . ABDOMINAL HYSTERECTOMY    . BLADDER SURGERY    . CARDIAC CATHETERIZATION N/A 05/10/2016   Procedure: Left Heart Cath and Coronary Angiography;  Surgeon: Belva Crome, MD;  Location: [place] [facility];  Service: Cardiovascular;  Laterality: N/A;  . COLONOSCOPY  May 2010   Fleishman: normal rectum, internal hemorrhoids, , benign colonic polyp  . COLONOSCOPY N/A 01/05/2017   Procedure: COLONOSCOPY;  Surgeon: Rogene Houston, MD;  [facility];  Service: Endoscopy;  Laterality: N/A;  1:00  . ESOPHAGOGASTRODUODENOSCOPY     2001 Dr. Amedeo Plenty: distal esophagitis, small hiatal hernia,. Dr. Tamala Julian 2006? no  records available currently, pt also reports  EGD a few years ago with Dr. Gala Romney, do not have these reports anywhere in medical records  . ESOPHAGOGASTRODUODENOSCOPY  06/02/2011   BJS:[id number] pill impaction as described above s/p dilation of a probable cervical esophageal web/bx abnormal esophageal and gastric mucosa. + H.pylori gastritis   . EYE SURGERY     Removed glass  . HAND SURGERY Right   . Left breast lumpectomy     Benign  . LEFT HEART CATHETERIZATION WITH CORONARY ANGIOGRAM N/A 07/03/2014   Procedure: LEFT HEART CATHETERIZATION WITH CORONARY ANGIOGRAM;  Surgeon: Belva Crome, MD;  Location: [facility] CATH LAB;  Service: Cardiovascular;  Laterality: N/A;  . POLYPECTOMY  01/05/2017   Procedure: POLYPECTOMY;  Surgeon: Rogene Houston, MD;  [facility];  Service: Endoscopy;;  colon  . SHOULDER SURGERY Left     Current Outpatient Medications  Medication Sig Dispense Refill  . ALPRAZolam (XANAX) 0.5 MG tablet Take 0.5 mg by mouth 2 (two) times daily.    [name] amLODipine (NORVASC) 5 MG tablet Take 1 tablet by mouth daily.    Francia Greaves THYROID 30 MG tablet Take 1 tablet by mouth daily.    [name] aspirin EC 81 MG tablet Take 1 tablet (81 mg total) by mouth daily.    [name]  atorvastatin (LIPITOR) 40 MG tablet Take 1 tablet (40 mg total) by mouth daily at 6 PM. 30 tablet 0  . EPINEPHrine 0.3 mg/0.3 mL IJ SOAJ injection Inject 0.3 mg into the muscle as needed (allergic reaction).     [name] escitalopram (LEXAPRO) 20 MG tablet Take 20 mg by mouth daily.     . fluticasone (FLOVENT HFA) 220 MCG/ACT inhaler Inhale 1 puff into the lungs 2 (two) times daily.    . hydrochlorothiazide (MICROZIDE) 12.5 MG capsule Take 1 capsule (12.5 mg total) by mouth daily. 90 capsule 3  . isosorbide mononitrate (IMDUR) 30 MG 24 hr tablet Take 3 tablets (90 mg total) by mouth daily. 90 tablet 6  . losartan (COZAAR) 25 MG tablet Take 1 tablet (25 mg total) by mouth daily. 90 tablet 3  . metoprolol tartrate (LOPRESSOR) 25 MG tablet  Take 1 tablet by mouth 2 (two) times daily.    . Multiple Vitamin (MULTIVITAMIN WITH MINERALS) TABS tablet Take 1 tablet by mouth daily.    [name] NITROSTAT 0.4 MG SL tablet PLACE 1 TAB UNDER TONGUE EVERY 5 MIN UP TO 3 TIMES AS NEEDED FOR CHEST PAIN, IF NO RELIEF CALL 911. 25 tablet 0  . pantoprazole (PROTONIX) 40 MG tablet Take 1 tablet by mouth daily.    . potassium chloride SA (K-DUR,KLOR-CON) 20 MEQ tablet Take 1 tablet (20 mEq total) by mouth daily. 30 tablet 0   No current facility-administered medications for this visit.    Allergies:  Bee venom; Penicillins; and Shellfish allergy   Social History: The patient  reports that she quit smoking about 5 years ago. Her smoking use included cigarettes. She started smoking about 34 years ago. She has a 6.40 pack-year smoking history. She has never used smokeless tobacco. She reports that she does not drink alcohol or use drugs.   Family History: The patient's family history includes ADD / ADHD in her daughter, son, and son; Alzheimer's disease in her paternal aunt; Bipolar disorder in her daughter, son, and son; Cancer in her father, maternal uncle, and paternal aunt; Cirrhosis in her maternal uncle; Colon cancer in her paternal grandfather; Heart attack in her brother; Heart disease in her brother; Mental illness in her daughter, son, and son.   ROS:  Please see the history of present illness. Otherwise, complete review of systems is positive for {NONE DEFAULTED:18576::"none"}.  All other systems are reviewed and negative.   Physical Exam: VS:  There were no vitals taken for this visit., BMI There is no height or weight on file to calculate BMI.  Wt Readings from Last 3 Encounters:  07/28/17 158 lb (71.7 kg)  07/13/17 156 lb 1.6 oz (70.8 kg)  05/24/17 156 lb (70.8 kg)    General: Patient appears comfortable at rest. HEENT: Conjunctiva and lids normal, oropharynx clear with moist mucosa. Neck: Supple, no elevated JVP or carotid bruits, no  thyromegaly. Lungs: Clear to auscultation, nonlabored breathing at rest. Cardiac: Regular rate and rhythm, no S3 or significant systolic murmur, no pericardial rub. Abdomen: Soft, nontender, no hepatomegaly, bowel sounds present, no guarding or rebound. Extremities: No pitting edema, distal pulses 2+. Skin: Warm and dry. Musculoskeletal: No kyphosis. Neuropsychiatric: Alert and oriented x3, affect grossly appropriate.  ECG: I personally reviewed the tracing from 07/14/2017 which showed sinus rhythm with increased voltage.  Recent Labwork: 07/13/2017: Hemoglobin 12.8; Platelets 212 07/14/2017: BUN 13; Creatinine, Ser 0.81; Magnesium 2.2; Potassium 3.6; Sodium 142     Component Value Date/Time   CHOL 182  07/14/2017 0302   TRIG 218 (H) 07/14/2017 0302   HDL 47 07/14/2017 0302   CHOLHDL 3.9 07/14/2017 0302   VLDL 44 (H) 07/14/2017 0302   LDLCALC 91 07/14/2017 0302    Other Studies Reviewed Today:  Cardiac catheterization 05/10/2016:  Widely patent LAD stent. Ostial 75% second diagonal jailed by LAD stent.  Totally occluded right coronary within the previously placed stent. The right fills by left to right collaterals.  Progression of first obtuse marginal from 30% to 75-80%. The vessel caliber is in the 1.5-2 mm range and is probably best treated with medical therapy.  Widely patent left main  Normal left ventricular function.EF estimated to be 60%. EDP is 14.  RECOMMENDATIONS:  Suspect angina is coming either from the first obtuse marginal, the second diagonal which is jailed, or from the collateralized right coronary.  Intensify medical therapy. ? Add Ranexa.  Intensify risk factor modification including LDL cholesterol less than 70.  Consider CTO PCI of the RCA if symptoms become refractory.  Consider PCI on the small to moderate size first obtuse marginal, however this vessel would possibly be oversized by our smallest stent.  Assessment and Plan:   Current medicines  were reviewed with the patient today.  No orders of the defined types were placed in this encounter.   Disposition:  Signed, Satira Sark, MD, [facility] 08/31/2017 10:33 AM    [place] [facility] [place]. [address], [place], [place] [address] Phone: [phone]; Fax: [phone]

## 2017-09-01 ENCOUNTER — Ambulatory Visit: Payer: Medicare HMO | Admitting: Cardiology

## 2017-09-20 NOTE — Progress Notes (Signed)
Cardiology Office Note    Date:  09/26/2017   ID:  Bethany Wang, DOB [birth year], MRN [id number]  PCP:  [name], MD  Cardiologist: [name], MD  Chief Complaint  Patient presents with  . Follow-up    History of Present Illness:  Bethany Wang is a 51 y.o. female with history of CAD status post DES to the RCA in 2013 DES to the LAD in 2014, cath 04/2016 patent LAD stent with jailed D2 by the LAD stent and CTO of RCA with left-to-right collaterals.  Also has hypertension, DM type II, HLD.  Hospitalized for atypical chest pain 07/13/2017 at any [facility].  Last saw [name], PA-C 07/28/2017 complaining of worsening swelling since PCP stopped her lisinopril HCTZ.  She restarted HCTZ because of elevated blood pressure.  Patient was already on losartan.  She comes in today accompanied by her daughter.  Blood pressure is quite high.  She said she was off all her medications for 3-1/2 weeks because she could not get refills.  When looking back refills were called in February and April.  She says she restarted her meds on Friday with Dr. [name] call them in.  She is also been fighting with her husband a lot and is very stressed out.  Having bloody noses in the a.m. and thinks it is from her blood pressure.    Past Medical History:  Diagnosis Date  . Anemia   . Bilateral breast cysts 12/30/2015  . Breast cancer ([place])   . BV (bacterial vaginosis) 09/09/2015  . Coronary atherosclerosis of native coronary artery    a. s/p DES to RCA in 2013 b. DES to LAD in 2014 c. cath in 04/2016 showing patent LAD stent with D2 jailed by LAD stent and CTO of RCA with left to right collaterals present  . Cyst of pharynx or nasopharynx    [name]  . Essential hypertension   . Falls    x 3-4 in past year  . GERD (gastroesophageal reflux disease)   . History of breast cancer 09/09/2015  . History of hematuria   . Mixed hyperlipidemia   . PUD (peptic ulcer disease)     . Seizures ([place])   . Shingles 09/09/2015  . Suicide attempt ([place] [address] [place])    3 attempts in remote past  . Type 2 diabetes mellitus ([place])   . Uterine cancer [facility])     Past Surgical History:  Procedure Laterality Date  . ABDOMINAL HYSTERECTOMY    . BLADDER SURGERY    . CARDIAC CATHETERIZATION N/A 05/10/2016   Procedure: Left Heart Cath and Coronary Angiography;  Surgeon: Belva Crome, MD;  Location: [place] [facility];  Service: Cardiovascular;  Laterality: N/A;  . COLONOSCOPY  May 2010   Fleishman: normal rectum, internal hemorrhoids, , benign colonic polyp  . COLONOSCOPY N/A 01/05/2017   Procedure: COLONOSCOPY;  Surgeon: Rogene Houston, MD;  [facility];  Service: Endoscopy;  Laterality: N/A;  1:00  . ESOPHAGOGASTRODUODENOSCOPY     2001 Dr. Amedeo Plenty: distal esophagitis, small hiatal hernia,. Dr. Tamala Julian 2006? no records available currently, pt also reports  EGD a few years ago with Dr. Gala Romney, do not have these reports anywhere in medical records  . ESOPHAGOGASTRODUODENOSCOPY  06/02/2011   [facility]:[id number] pill impaction as described above s/p dilation of a probable cervical esophageal web/bx abnormal esophageal and gastric mucosa. + H.pylori gastritis   . EYE SURGERY     Removed glass  . HAND  SURGERY Right   . Left breast lumpectomy     Benign  . LEFT HEART CATHETERIZATION WITH CORONARY ANGIOGRAM N/A 07/03/2014   Procedure: LEFT HEART CATHETERIZATION WITH CORONARY ANGIOGRAM;  Surgeon: Belva Crome, MD;  Location: [facility] CATH LAB;  Service: Cardiovascular;  Laterality: N/A;  . POLYPECTOMY  01/05/2017   Procedure: POLYPECTOMY;  Surgeon: Rogene Houston, MD;  [facility];  Service: Endoscopy;;  colon  . SHOULDER SURGERY Left     Current Medications: Current Meds  Medication Sig  . ALPRAZolam (XANAX) 0.5 MG tablet Take 0.5 mg by mouth 2 (two) times daily.  [name] amLODipine (NORVASC) 5 MG tablet Take 1 tablet by mouth daily.  Francia Greaves THYROID 30 MG tablet Take 1 tablet by mouth  daily.  [name] aspirin EC 81 MG tablet Take 1 tablet (81 mg total) by mouth daily.  [name] atorvastatin (LIPITOR) 40 MG tablet Take 1 tablet (40 mg total) by mouth daily at 6 PM.  . EPINEPHrine 0.3 mg/0.3 mL IJ SOAJ injection Inject 0.3 mg into the muscle as needed (allergic reaction).   [name] escitalopram (LEXAPRO) 20 MG tablet Take 20 mg by mouth daily.   . fluticasone (FLOVENT HFA) 220 MCG/ACT inhaler Inhale 1 puff into the lungs 2 (two) times daily.  . isosorbide mononitrate (IMDUR) 30 MG 24 hr tablet Take 3 tablets (90 mg total) by mouth daily.  . metoprolol tartrate (LOPRESSOR) 25 MG tablet Take 1 tablet by mouth 2 (two) times daily.  . Multiple Vitamin (MULTIVITAMIN WITH MINERALS) TABS tablet Take 1 tablet by mouth daily.  [name] NITROSTAT 0.4 MG SL tablet PLACE 1 TAB UNDER TONGUE EVERY 5 MIN UP TO 3 TIMES AS NEEDED FOR CHEST PAIN, IF NO RELIEF CALL 911.  . pantoprazole (PROTONIX) 40 MG tablet Take 1 tablet by mouth daily.  . potassium chloride SA (K-DUR,KLOR-CON) 20 MEQ tablet Take 1 tablet (20 mEq total) by mouth daily.  . [DISCONTINUED] hydrochlorothiazide (MICROZIDE) 12.5 MG capsule Take 1 capsule (12.5 mg total) by mouth daily.  . [DISCONTINUED] losartan (COZAAR) 25 MG tablet Take 1 tablet (25 mg total) by mouth daily.     Allergies:   Bee venom; Penicillins; and Shellfish allergy   Social History   Socioeconomic History  . Marital status: Married    Spouse name: Not on file  . Number of children: 3  . Years of education: Assoc  . Highest education level: Not on file  Occupational History  . Occupation: Retired    Comment: disability  Social Needs  . Financial resource strain: Not on file  . Food insecurity:    Worry: Not on file    Inability: Not on file  . Transportation needs:    Medical: Not on file    Non-medical: Not on file  Tobacco Use  . Smoking status: Current Some Day Smoker    Packs/day: 0.20    Years: 32.00    Pack years: 6.40    Types: Cigarettes, Cigars    Start  date: 07/05/1983    Last attempt to quit: 04/03/2012    Years since quitting: 5.4  . Smokeless tobacco: Never Used  Substance and Sexual Activity  . Alcohol use: No    Alcohol/week: 0.0 oz  . Drug use: No  . Sexual activity: Yes    Birth control/protection: Surgical    Comment: hyst  Lifestyle  . Physical activity:    Days per week: Not on file    Minutes per session: Not on file  .  Stress: Not on file  Relationships  . Social connections:    Talks on phone: Not on file    Gets together: Not on file    Attends religious service: Not on file    Active member of club or organization: Not on file    Attends meetings of clubs or organizations: Not on file    Relationship status: Not on file  Other Topics Concern  . Not on file  Social History Narrative   Patient lives at home with her fiance.   Caffeine Use: 3 cups daily     Family History:  The patient's family history includes ADD / ADHD in her daughter, son, and son; Alzheimer's disease in her paternal aunt; Bipolar disorder in her daughter, son, and son; Cancer in her father, maternal uncle, and paternal aunt; Cirrhosis in her maternal uncle; Colon cancer in her paternal grandfather; Heart attack in her brother; Heart disease in her brother; Mental illness in her daughter, son, and son.   ROS:   Please see the history of present illness.    Review of Systems  Constitution: Negative.  HENT: Negative.   Eyes: Negative.   Cardiovascular: Negative.   Respiratory: Negative.   Hematologic/Lymphatic: Bruises/bleeds easily.  Musculoskeletal: Positive for joint pain.  Gastrointestinal: Negative.   Genitourinary: Negative.   Neurological: Negative.   Psychiatric/Behavioral: The patient is nervous/anxious.    All other systems reviewed and are negative.   PHYSICAL EXAM:   VS:  BP (!) 158/110 (BP Location: Left Arm, Cuff Size: Normal)   Pulse 64   Ht 5\' 4"  (1.626 m)   Wt 152 lb 3.2 oz (69 kg)   SpO2 97% Comment: on room air   BMI 26.13 kg/m   Physical Exam  GEN: Well nourished, well developed, in no acute distress  Neck: no JVD, carotid bruits, or masses Cardiac:RRR; as of S4, 2/6 systolic murmur the left sternal border Respiratory:  clear to auscultation bilaterally, normal work of breathing GI: soft, nontender, nondistended, + BS Ext: without cyanosis, clubbing, or edema, Good distal pulses bilaterally Neuro:  Alert and Oriented x 3,  Psych: euthymic mood, full affect  Wt Readings from Last 3 Encounters:  09/26/17 152 lb 3.2 oz (69 kg)  07/28/17 158 lb (71.7 kg)  07/13/17 156 lb 1.6 oz (70.8 kg)      Studies/Labs Reviewed:   EKG:  EKG is not ordered today.   Recent Labs: 07/13/2017: Hemoglobin 12.8; Platelets 212 07/14/2017: BUN 13; Creatinine, Ser 0.81; Magnesium 2.2; Potassium 3.6; Sodium 142   Lipid Panel    Component Value Date/Time   CHOL 182 07/14/2017 0302   TRIG 218 (H) 07/14/2017 0302   HDL 47 07/14/2017 0302   CHOLHDL 3.9 07/14/2017 0302   VLDL 44 (H) 07/14/2017 0302   LDLCALC 91 07/14/2017 0302    Additional studies/ records that were reviewed today include:   Cardiac catheterization 1/12 by/[facility]   Left Heart Cath and Coronary Angiography  Conclusion    Widely patent LAD stent. Ostial 75% second diagonal jailed by LAD stent.  Totally occluded right coronary within the previously placed stent. The right fills by left to right collaterals.  Progression of first obtuse marginal from 30% to 75-80%. The vessel caliber is in the 1.5-2 mm range and is probably best treated with medical therapy.  Widely patent left main  Normal left ventricular function.EF estimated to be 60%. EDP is 14.   RECOMMENDATIONS:  Suspect angina is coming either from the first obtuse marginal,  the second diagonal which is jailed, or from the collateralized right coronary.  Intensify medical therapy. ? Add Ranexa.  Intensify risk factor modification including LDL cholesterol less than  70.  Consider CTO PCI of the RCA if symptoms become refractory.  Consider PCI on the small to moderate size first obtuse marginal, however this vessel would possibly be oversized by our smallest stent.      ASSESSMENT:    1. Atherosclerosis of native coronary artery of native heart with angina pectoris ([place])   2. Essential hypertension, benign   3. Hyperlipidemia, unspecified hyperlipidemia type      PLAN:  In order of problems listed above:  CAD with prior DES RCA 2013, DES LAD 2014 most recent cath 04/2016 med therapy-see above.  No recurrent chest pain  Essential hypertension blood pressure is quite high today.  She was out of her medications for 3-1/2 weeks and restarted them 3 days ago.  Will increase losartan to 50 mg once daily increase HCTZ to 25 mg daily.  Come back in 2 weeks for blood pressure check and renal function.  2 g sodium diet given.  Try to reduce stress and anxiety at home.  Hyperlipidemia Continue Lipitor  Lower extremity edema. HCTZ restarted by [place].  Edema improved but blood pressure high will increase HCTZ  Medication Adjustments/Labs and Tests Ordered: Current medicines are reviewed at length with the patient today.  Concerns regarding medicines are outlined above.  Medication changes, Labs and Tests ordered today are listed in the Patient Instructions below. There are no Patient Instructions on file for this visit.   Sumner Boast, PA-C  09/26/2017 12:29 PM    [facility] [address], [place], [place]  [address] Phone: [phone]; Fax: [phone]

## 2017-09-21 DIAGNOSIS — E785 Hyperlipidemia, unspecified: Secondary | ICD-10-CM | POA: Diagnosis not present

## 2017-09-21 DIAGNOSIS — Z955 Presence of coronary angioplasty implant and graft: Secondary | ICD-10-CM | POA: Diagnosis not present

## 2017-09-21 DIAGNOSIS — M25569 Pain in unspecified knee: Secondary | ICD-10-CM | POA: Diagnosis not present

## 2017-09-21 DIAGNOSIS — I10 Essential (primary) hypertension: Secondary | ICD-10-CM | POA: Diagnosis not present

## 2017-09-23 ENCOUNTER — Ambulatory Visit: Payer: Medicare HMO | Admitting: Student

## 2017-09-26 ENCOUNTER — Encounter: Payer: Self-pay | Admitting: Physician Assistant

## 2017-09-26 ENCOUNTER — Ambulatory Visit (INDEPENDENT_AMBULATORY_CARE_PROVIDER_SITE_OTHER): Payer: Medicare HMO | Admitting: Physician Assistant

## 2017-09-26 VITALS — BP 158/110 | HR 64 | Ht 64.0 in | Wt 152.2 lb

## 2017-09-26 DIAGNOSIS — E785 Hyperlipidemia, unspecified: Secondary | ICD-10-CM

## 2017-09-26 DIAGNOSIS — I25119 Atherosclerotic heart disease of native coronary artery with unspecified angina pectoris: Secondary | ICD-10-CM | POA: Diagnosis not present

## 2017-09-26 DIAGNOSIS — I10 Essential (primary) hypertension: Secondary | ICD-10-CM

## 2017-09-26 DIAGNOSIS — E119 Type 2 diabetes mellitus without complications: Secondary | ICD-10-CM | POA: Diagnosis not present

## 2017-09-26 MED ORDER — LOSARTAN POTASSIUM 50 MG PO TABS: 50.0000 mg | ORAL_TABLET | Freq: Every day | ORAL | 3 refills | Status: DC

## 2017-09-26 MED ORDER — HYDROCHLOROTHIAZIDE 25 MG PO TABS: 25.0000 mg | ORAL_TABLET | Freq: Every day | ORAL | 3 refills | Status: DC

## 2017-09-26 NOTE — Patient Instructions (Signed)
Your physician recommends that you schedule a follow-up appointment in:  2 weeks for Nurse BP check, get lab work done that day also:BMET   3 months with Dr.McDowell     INCREASE HCTZ to 25 mg daily   INCREASE Losartan to 50 mg daily   No tests ordered todayTwo Gram Sodium Diet 2000 mg  What is Sodium? Sodium is a mineral found naturally in many foods. The most significant source of sodium in the diet is table salt, which is about 40% sodium.  Processed, convenience, and preserved foods also contain a large amount of sodium.  The body needs only 500 mg of sodium daily to function,  A normal diet provides more than enough sodium even if you do not use salt.  Why Limit Sodium? A build up of sodium in the body can cause thirst, increased blood pressure, shortness of breath, and water retention.  Decreasing sodium in the diet can reduce edema and risk of heart attack or stroke associated with high blood pressure.  Keep in mind that there are many other factors involved in these health problems.  Heredity, obesity, lack of exercise, cigarette smoking, stress and what you eat all play a role.  General Guidelines:  Do not add salt at the table or in cooking.  One teaspoon of salt contains over 2 grams of sodium.  Read food labels  Avoid processed and convenience foods  Ask your dietitian before eating any foods not dicussed in the menu planning guidelines  Consult your physician if you wish to use a salt substitute or a sodium containing medication such as antacids.  Limit milk and milk products to 16 oz (2 cups) per day.  Shopping Hints:  READ LABELS!! "Dietetic" does not necessarily mean low sodium.  Salt and other sodium ingredients are often added to foods during processing.   Menu Planning Guidelines Food Group Choose More Often Avoid  Beverages (see also the milk group All fruit juices, low-sodium, salt-free vegetables juices, low-sodium carbonated beverages Regular vegetable  or tomato juices, commercially softened water used for drinking or cooking  Breads and Cereals Enriched white, wheat, rye and pumpernickel bread, hard rolls and dinner rolls; muffins, cornbread and waffles; most dry cereals, cooked cereal without added salt; unsalted crackers and breadsticks; low sodium or homemade bread crumbs Bread, rolls and crackers with salted tops; quick breads; instant hot cereals; pancakes; commercial bread stuffing; self-rising flower and biscuit mixes; regular bread crumbs or cracker crumbs  Desserts and Sweets Desserts and sweets mad with mild should be within allowance Instant pudding mixes and cake mixes  Fats Butter or margarine; vegetable oils; unsalted salad dressings, regular salad dressings limited to 1 Tbs; light, sour and heavy cream Regular salad dressings containing bacon fat, bacon bits, and salt pork; snack dips made with instant soup mixes or processed cheese; salted nuts  Fruits Most fresh, frozen and canned fruits Fruits processed with salt or sodium-containing ingredient (some dried fruits are processed with sodium sulfites        Vegetables Fresh, frozen vegetables and low- sodium canned vegetables Regular canned vegetables, sauerkraut, pickled vegetables, and others prepared in brine; frozen vegetables in sauces; vegetables seasoned with ham, bacon or salt pork  Condiments, Sauces, Miscellaneous  Salt substitute with physician's approval; pepper, herbs, spices; vinegar, lemon or lime juice; hot pepper sauce; garlic powder, onion powder, low sodium soy sauce (1 Tbs.); low sodium condiments (ketchup, chili sauce, mustard) in limited amounts (1 tsp.) fresh ground horseradish; unsalted tortilla chips, pretzels,  potato chips, popcorn, salsa (1/4 cup) Any seasoning made with salt including garlic salt, celery salt, onion salt, and seasoned salt; sea salt, rock salt, kosher salt; meat tenderizers; monosodium glutamate; mustard, regular soy sauce, barbecue, sauce,  chili sauce, teriyaki sauce, steak sauce, Worcestershire sauce, and most flavored vinegars; canned gravy and mixes; regular condiments; salted snack foods, olives, picles, relish, horseradish sauce, catsup   Food preparation: Try these seasonings Meats:    Pork Sage, onion Serve with applesauce  Chicken Poultry seasoning, thyme, parsley Serve with cranberry sauce  Lamb Curry powder, rosemary, garlic, thyme Serve with mint sauce or jelly  Veal Marjoram, basil Serve with current jelly, cranberry sauce  Beef Pepper, bay leaf Serve with dry mustard, unsalted chive butter  Fish [place] leaf, dill Serve with unsalted lemon butter, unsalted parsley butter  Vegetables:    Asparagus Lemon juice   Broccoli Lemon juice   Carrots Mustard dressing parsley, mint, nutmeg, glazed with unsalted butter and sugar   Green beans Marjoram, lemon juice, nutmeg,dill seed   Tomatoes Basil, marjoram, onion   Spice /blend for [employer]" 4 tsp ground thyme 1 tsp ground sage 3 tsp ground rosemary 4 tsp ground marjoram   Test your knowledge 1. A product that says "Salt Free" may still contain sodium. True or False 2. Garlic Powder and Hot Pepper Sauce an be used as alternative seasonings.True or False 3. Processed foods have more sodium than fresh foods.  True or False 4. Canned Vegetables have less sodium than froze True or False  WAYS TO DECREASE YOUR SODIUM INTAKE 1. Avoid the use of added salt in cooking and at the table.  Table salt (and other prepared seasonings which contain salt) is probably one of the greatest sources of sodium in the diet.  Unsalted foods can gain flavor from the sweet, sour, and butter taste sensations of herbs and spices.  Instead of using salt for seasoning, try the following seasonings with the foods listed.  Remember: how you use them to enhance natural food flavors is limited only by your creativity... Allspice-Meat, fish, eggs, fruit, peas, red and yellow vegetables Almond  Extract-Fruit baked goods Anise Seed-Sweet breads, fruit, carrots, beets, cottage cheese, cookies (tastes like licorice) Basil-Meat, fish, eggs, vegetables, rice, vegetables salads, soups, sauces Bay Leaf-Meat, fish, stews, poultry Burnet-Salad, vegetables (cucumber-like flavor) Caraway Seed-Bread, cookies, cottage cheese, meat, vegetables, cheese, rice Cardamon-Baked goods, fruit, soups Celery Powder or seed-Salads, salad dressings, sauces, meatloaf, soup, bread.Do not use  celery salt Chervil-Meats, salads, fish, eggs, vegetables, cottage cheese (parsley-like flavor) Chili Power-Meatloaf, chicken cheese, corn, eggplant, egg dishes Chives-Salads cottage cheese, egg dishes, soups, vegetables, sauces Cilantro-Salsa, casseroles Cinnamon-Baked goods, fruit, pork, lamb, chicken, carrots Cloves-Fruit, baked goods, fish, pot roast, green beans, beets, carrots Coriander-Pastry, cookies, meat, salads, cheese (lemon-orange flavor) Cumin-Meatloaf, fish,cheese, eggs, cabbage,fruit pie (caraway flavor) [employer], fruit, eggs, fish, poultry, cottage cheese, vegetables Dill Seed-Meat, cottage cheese, poultry, vegetables, fish, salads, bread Fennel Seed-Bread, cookies, apples, pork, eggs, fish, beets, cabbage, cheese, Licorice-like flavor Garlic-(buds or powder) Salads, meat, poultry, fish, bread, butter, vegetables, potatoes.Do not  use garlic salt Ginger-Fruit, vegetables, baked goods, meat, fish, poultry Horseradish Root-Meet, vegetables, butter Lemon Juice or Extract-Vegetables, fruit, tea, baked goods, fish salads Mace-Baked goods fruit, vegetables, fish, poultry (taste like nutmeg) Maple Extract-Syrups Marjoram-Meat, chicken, fish, vegetables, breads, green salads (taste like Sage) Mint-Tea, lamb, sherbet, vegetables, desserts, carrots, cabbage Mustard, Dry or Seed-Cheese, eggs, meats, vegetables, poultry Nutmeg-Baked goods, fruit, chicken, eggs, vegetables, desserts Onion Powder-Meat,  fish, poultry,  vegetables, cheese, eggs, bread, rice salads (Do not use   Onion salt) Orange Extract-Desserts, baked goods Oregano-Pasta, eggs, cheese, onions, pork, lamb, fish, chicken, vegetables, green salads Paprika-Meat, fish, poultry, eggs, cheese, vegetables Parsley Flakes-Butter, vegetables, meat fish, poultry, eggs, bread, salads (certain forms may   Contain sodium Pepper-Meat fish, poultry, vegetables, eggs Peppermint Extract-Desserts, baked goods Poppy Seed-Eggs, bread, cheese, fruit dressings, baked goods, noodles, vegetables, cottage  [employer], poultry, meat, fish, cauliflower, turnips,eggs bread Saffron-Rice, bread, veal, chicken, fish, eggs Sage-Meat, fish, poultry, onions, eggplant, tomateos, pork, stews Savory-Eggs, salads, poultry, meat, rice, vegetables, soups, pork Tarragon-Meat, poultry, fish, eggs, butter, vegetables (licorice-like flavor)  Thyme-Meat, poultry, fish, eggs, vegetables, (clover-like flavor), sauces, soups Tumeric-Salads, butter, eggs, fish, rice, vegetables (saffron-like flavor) Vanilla Extract-Baked goods, candy Vinegar-Salads, vegetables, meat marinades Walnut Extract-baked goods, candy  2. Choose your Foods Wisely   The following is a list of foods to avoid which are high in sodium:  Meats-Avoid all smoked, canned, salt cured, dried and kosher meat and fish as well as Anchovies   [employer] meats:Bologna, Liverwurst, Pastrami Canned meat or fish  Marinated herring Caviar    Pepperoni Corned Beef   Pizza Dried chipped beef  Salami Frozen breaded fish or meat Salt pork Frankfurters or hot dogs  Sardines Gefilte fish   Sausage Ham (boiled ham, Proscuitto Smoked butt    spiced ham)   Spam      TV Dinners Vegetables Canned vegetables (Regular) Relish Canned mushrooms  Sauerkraut Olives    Tomato juice Pickles  Bakery and Dessert Products Canned puddings  Cream  pies Cheesecake   Decorated cakes Cookies  Beverages/Juices Tomato juice, regular  Gatorade   V-8 vegetable juice, regular  Breads and Cereals Biscuit mixes   Salted potato chips, corn chips, pretzels Bread stuffing mixes  Salted crackers and rolls Pancake and waffle mixes Self-rising flour  Seasonings Accent    Meat sauces Barbecue sauce  Meat tenderizer Catsup    Monosodium glutamate (MSG) Celery salt   Onion salt Chili sauce   Prepared mustard Garlic salt   Salt, seasoned salt, sea salt Gravy mixes   Soy sauce Horseradish   Steak sauce Ketchup   Tartar sauce Lite salt    Teriyaki sauce Marinade mixes   Worcestershire sauce  Others Baking powder   Cocoa and cocoa mixes Baking soda   Commercial casserole mixes Candy-caramels, chocolate  Dehydrated soups    Bars, fudge,nougats  Instant rice and pasta mixes Canned broth or soup  Maraschino cherries Cheese, aged and processed cheese and cheese spreads  Learning Assessment Quiz  Indicated T (for True) or F (for False) for each of the following statements:  1. _____ Fresh fruits and vegetables and unprocessed grains are generally low in sodium 2. _____ Water may contain a considerable amount of sodium, depending on the source 3. _____ You can always tell if a food is high in sodium by tasting it 4. _____ Certain laxatives my be high in sodium and should be avoided unless prescribed   by a physician or pharmacist 5. _____ Salt substitutes may be used freely by anyone on a sodium restricted diet 6. _____ Sodium is present in table salt, food additives and as a natural component of   most foods 7. _____ Table salt is approximately 90% sodium 8. _____ Limiting sodium intake may help prevent excess fluid accumulation in the body 9. _____ On a sodium-restricted diet, seasonings such as bouillon soy sauce, and  cooking wine should be used in place of table salt 10. _____ On an ingredient list, a product which lists monosodium  glutamate as the first   ingredient is an appropriate food to include on a low sodium diet  Circle the best answer(s) to the following statements (Hint: there may be more than one correct answer)  11. On a low-sodium diet, some acceptable snack items are:    A. Olives  F. Bean dip   K. Grapefruit juice    B. Salted Pretzels G. Commercial Popcorn   L. Canned peaches    C. Carrot Sticks  H. Bouillon   M. Unsalted nuts   D. [place] fries  I. Peanut butter crackers N. Salami   E. Sweet pickles J. Tomato Juice   O. Pizza  12.  Seasonings that may be used freely on a reduced - sodium diet include   A. Lemon wedges F.Monosodium glutamate K. Celery seed    B.Soysauce   G. Pepper   L. Mustard powder   C. Sea salt  H. Cooking wine  M. Onion flakes   D. Vinegar  E. Prepared horseradish N. Salsa   E. Sage   J. Worcestershire sauce  O. Chutney

## 2017-10-03 DIAGNOSIS — G40802 Other epilepsy, not intractable, without status epilepticus: Secondary | ICD-10-CM | POA: Diagnosis not present

## 2017-10-06 ENCOUNTER — Encounter (HOSPITAL_COMMUNITY): Payer: Self-pay | Admitting: Emergency Medicine

## 2017-10-06 ENCOUNTER — Emergency Department (HOSPITAL_COMMUNITY)
Admission: EM | Admit: 2017-10-06 | Discharge: 2017-10-06 | Disposition: A | Discharge status: Home / Self Care | Payer: Medicare HMO | Attending: Emergency Medicine | Admitting: Emergency Medicine

## 2017-10-06 ENCOUNTER — Other Ambulatory Visit: Payer: Self-pay

## 2017-10-06 ENCOUNTER — Emergency Department (HOSPITAL_COMMUNITY): Payer: Medicare HMO

## 2017-10-06 DIAGNOSIS — M542 Cervicalgia: Secondary | ICD-10-CM | POA: Diagnosis not present

## 2017-10-06 DIAGNOSIS — E119 Type 2 diabetes mellitus without complications: Secondary | ICD-10-CM | POA: Insufficient documentation

## 2017-10-06 DIAGNOSIS — F17210 Nicotine dependence, cigarettes, uncomplicated: Secondary | ICD-10-CM | POA: Diagnosis not present

## 2017-10-06 DIAGNOSIS — Z8541 Personal history of malignant neoplasm of cervix uteri: Secondary | ICD-10-CM | POA: Insufficient documentation

## 2017-10-06 DIAGNOSIS — Z853 Personal history of malignant neoplasm of breast: Secondary | ICD-10-CM | POA: Diagnosis not present

## 2017-10-06 DIAGNOSIS — Z7982 Long term (current) use of aspirin: Secondary | ICD-10-CM | POA: Diagnosis not present

## 2017-10-06 DIAGNOSIS — Z79899 Other long term (current) drug therapy: Secondary | ICD-10-CM | POA: Diagnosis not present

## 2017-10-06 DIAGNOSIS — I10 Essential (primary) hypertension: Secondary | ICD-10-CM | POA: Insufficient documentation

## 2017-10-06 DIAGNOSIS — E042 Nontoxic multinodular goiter: Secondary | ICD-10-CM | POA: Diagnosis not present

## 2017-10-06 LAB — CBC WITH DIFFERENTIAL/PLATELET
BASOS ABS: 0 10*3/uL (ref 0.0–0.1)
BASOS PCT: 1 %
EOS PCT: 1 %
Eosinophils Absolute: 0.1 10*3/uL (ref 0.0–0.7)
HCT: 41.5 % (ref 36.0–46.0)
Hemoglobin: 13.6 g/dL (ref 12.0–15.0)
Lymphocytes Relative: 60 %
Lymphs Abs: 3.9 10*3/uL (ref 0.7–4.0)
MCH: 27.9 pg (ref 26.0–34.0)
MCHC: 32.8 g/dL (ref 30.0–36.0)
MCV: 85 fL (ref 78.0–100.0)
MONO ABS: 0.3 10*3/uL (ref 0.1–1.0)
Monocytes Relative: 4 %
Neutro Abs: 2.2 10*3/uL (ref 1.7–7.7)
Neutrophils Relative %: 34 %
Platelets: 221 10*3/uL (ref 150–400)
RBC: 4.88 MIL/uL (ref 3.87–5.11)
RDW: 14.3 % (ref 11.5–15.5)
WBC: 6.4 10*3/uL (ref 4.0–10.5)

## 2017-10-06 LAB — BASIC METABOLIC PANEL
ANION GAP: 6 (ref 5–15)
BUN: 13 mg/dL (ref 6–20)
CALCIUM: 9.4 mg/dL (ref 8.9–10.3)
CO2: 28 mmol/L (ref 22–32)
Chloride: 106 mmol/L (ref 98–111)
Creatinine, Ser: 0.8 mg/dL (ref 0.44–1.00)
GFR calc Af Amer: >60 mL/min (ref >60)
GLUCOSE: 92 mg/dL (ref 70–99)
POTASSIUM: 3.4 mmol/L — AB (ref 3.5–5.1)
SODIUM: 140 mmol/L (ref 135–145)

## 2017-10-06 MED ORDER — KETOROLAC TROMETHAMINE 30 MG/ML IJ SOLN
15.0000 mg | Freq: Once | INTRAMUSCULAR | Status: AC
  Administered 2017-10-06: 15 mg via INTRAVENOUS
  Filled 2017-10-06: qty 1

## 2017-10-06 MED ORDER — TRAMADOL HCL 50 MG PO TABS: 50.0000 mg | ORAL_TABLET | Freq: Four times a day (QID) | ORAL | 0 refills | Status: DC | PRN

## 2017-10-06 MED ORDER — IOPAMIDOL (ISOVUE-300) INJECTION 61%
75.0000 mL | Freq: Once | INTRAVENOUS | Status: AC | PRN
  Administered 2017-10-06: 75 mL via INTRAVENOUS

## 2017-10-06 NOTE — ED Provider Notes (Addendum)
[facility] EMERGENCY DEPARTMENT Provider Note   CSN: [id number] Arrival date & time: 10/06/17  1236     History   Chief Complaint Chief Complaint  Patient presents with  . Neck Pain    HPI Bethany Wang is a 52 y.o. female with a distant history of breast and uterine cancer, DM, HTN, CAD with stents, right carotid stenosis, HTN, GERD presenting with right neck pain and swelling.  She had similar symptoms early May and was seen by her pcp at which time she had thyroid testing which was a little low, so was placed on low dose synthroid. She states the swelling resolved, only to flare up again 5 days ago.  She reports aching pain at the site which is worsened with movement of her neck and direct palpation.  She denies fever or chills and has had no dental pain, sore throat, dizziness or syncope. She does endorse chronic  Headaches but not worsened with these episodes neck pain, no difficulty swallowing, no concerns of chest pain or sob, has a good appetite with no unexplained weight loss.  She has found no alleviators for her symptoms.      The history is provided by the patient.    Past Medical History:  Diagnosis Date  . Anemia   . Bilateral breast cysts 12/30/2015  . Breast cancer ([place])   . BV (bacterial vaginosis) 09/09/2015  . Coronary atherosclerosis of native coronary artery    a. s/p DES to RCA in 2013 b. DES to LAD in 2014 c. cath in 04/2016 showing patent LAD stent with D2 jailed by LAD stent and CTO of RCA with left to right collaterals present  . Cyst of pharynx or nasopharynx    [name]  . Essential hypertension   . Falls    x 3-4 in past year  . GERD (gastroesophageal reflux disease)   . History of breast cancer 09/09/2015  . History of hematuria   . Mixed hyperlipidemia   . PUD (peptic ulcer disease)   . Seizures ([place])   . Shingles 09/09/2015  . Suicide attempt ([place])    3 attempts in remote past  . Type 2 diabetes mellitus ([place])   .  Uterine cancer [facility])     Patient Active Problem List   Diagnosis Date Noted  . Chest pain 07/13/2017  . Nonspecific chest pain 07/13/2017  . Essential hypertension 07/13/2017  . Mixed hyperlipidemia 07/13/2017  . Precordial chest pain   . Hypertensive urgency 06/15/2016  . Unstable angina pectoris ([place]) 06/15/2016  . Unstable angina ([place]) 06/15/2016  . Bilateral breast cysts 12/30/2015  . Vaginal discharge 09/09/2015  . BV (bacterial vaginosis) 09/09/2015  . Shingles 09/09/2015  . History of breast cancer 09/09/2015  . Syncope and collapse 08/08/2015  . Migraine with aura and with status migrainosus, not intractable 08/08/2015  . Chronic low back pain 08/08/2015  . Insomnia 08/08/2015  . Anxiety state 08/08/2015  . Panic attacks 08/08/2015  . Anemia, iron deficiency 08/08/2015  . Pica 08/08/2015  . Angina decubitus ([place]) 07/03/2014  . 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 Encounter for gynecological examination with Papanicolaou smear of cervix 02/04/2014  . Syncope 12/14/2013  . Cervical disc disorder with radiculopathy of cervical region 02/19/2013  . H/O rotator cuff surgery 02/19/2013  . Dyspareunia 02/01/2013  . Left shoulder pain 01/24/2013  . Rotator cuff tear 01/24/2013  . Radicular pain 01/24/2013  . Labral tear of shoulder 01/24/2013  . Complex regional pain syndrome  of upper extremity 01/24/2013  . Herniated disc, cervical 01/24/2013  . Abdominal pain, other specified site 01/18/2013  . Nausea and vomiting 01/18/2013  . Rectal bleeding 01/18/2013  . Edema of left foot 10/16/2012  . Coronary atherosclerosis of native coronary artery   . Essential hypertension, benign   . HLD (hyperlipidemia)   . Tobacco abuse     Past Surgical History:  Procedure Laterality Date  . ABDOMINAL HYSTERECTOMY    . BLADDER SURGERY    . CARDIAC CATHETERIZATION N/A 05/10/2016   Procedure: Left Heart Cath and Coronary Angiography;  Surgeon: Belva Crome, MD;  Location:  [facility];  Service: Cardiovascular;  Laterality: N/A;  . COLONOSCOPY  May 2010   Fleishman: normal rectum, internal hemorrhoids, , benign colonic polyp  . COLONOSCOPY N/A 01/05/2017   Procedure: COLONOSCOPY;  Surgeon: Rogene Houston, MD;  Location: [facility];  Service: Endoscopy;  Laterality: N/A;  1:00  . ESOPHAGOGASTRODUODENOSCOPY     2001 Dr. Amedeo Plenty: distal esophagitis, small hiatal hernia,. Dr. Tamala Julian 2006? no records available currently, pt also reports  EGD a few years ago with Dr. Gala Romney, do not have these reports anywhere in medical records  . ESOPHAGOGASTRODUODENOSCOPY  06/02/2011   ZOX:[id number] pill impaction as described above s/p dilation of a probable cervical esophageal web/bx abnormal esophageal and gastric mucosa. + H.pylori gastritis   . EYE SURGERY     Removed glass  . HAND SURGERY Right   . Left breast lumpectomy     Benign  . LEFT HEART CATHETERIZATION WITH CORONARY ANGIOGRAM N/A 07/03/2014   Procedure: LEFT HEART CATHETERIZATION WITH CORONARY ANGIOGRAM;  Surgeon: Belva Crome, MD;  Location: [facility] CATH LAB;  Service: Cardiovascular;  Laterality: N/A;  . POLYPECTOMY  01/05/2017   Procedure: POLYPECTOMY;  Surgeon: Rogene Houston, MD;  Location: [facility];  Service: Endoscopy;;  colon  . SHOULDER SURGERY Left      OB History    Gravida  4   Para  3   Term      Preterm      AB  1   Living  3     SAB  1   TAB      Ectopic      Multiple      Live Births               Home Medications    Prior to Admission medications   Medication Sig Start Date End Date Taking? Authorizing Provider  ALPRAZolam Duanne Moron) 0.5 MG tablet Take 0.5 mg by mouth 2 (two) times daily.    [provider]  amLODipine (NORVASC) 5 MG tablet Take 1 tablet by mouth daily.    [provider]  ARMOUR THYROID 30 MG tablet Take 1 tablet by mouth daily. 06/13/17   [provider]  aspirin EC 81 MG tablet Take 1 tablet (81 mg total) by mouth daily.  01/08/17   Rogene Houston, MD  atorvastatin (LIPITOR) 40 MG tablet Take 1 tablet (40 mg total) by mouth daily at 6 PM. 06/16/16   Eber Jones, MD  EPINEPHrine 0.3 mg/0.3 mL IJ SOAJ injection Inject 0.3 mg into the muscle as needed (allergic reaction).     [provider]  escitalopram (LEXAPRO) 20 MG tablet Take 20 mg by mouth daily.  11/03/15   [provider]  fluticasone (FLOVENT HFA) 220 MCG/ACT inhaler Inhale 1 puff into the lungs 2 (two) times daily.    [provider]  hydrochlorothiazide (HYDRODIURIL) 25 MG tablet Take 1 tablet (25 mg total) by mouth daily. 09/26/17 12/25/17  Imogene Burn, PA-C  isosorbide mononitrate (IMDUR) 30 MG 24 hr tablet Take 3 tablets (90 mg total) by mouth daily. 07/20/16   Satira Sark, MD  losartan (COZAAR) 50 MG tablet Take 1 tablet (50 mg total) by mouth daily. 09/26/17 12/25/17  Imogene Burn, PA-C  metoprolol tartrate (LOPRESSOR) 25 MG tablet Take 1 tablet by mouth 2 (two) times daily. 06/09/17   [provider]  Multiple Vitamin (MULTIVITAMIN WITH MINERALS) TABS tablet Take 1 tablet by mouth daily.    [provider]  NITROSTAT 0.4 MG SL tablet PLACE 1 TAB UNDER TONGUE EVERY 5 MIN UP TO 3 TIMES AS NEEDED FOR CHEST PAIN, IF NO RELIEF CALL 911. 05/24/17   Herminio Commons, MD  pantoprazole (PROTONIX) 40 MG tablet Take 1 tablet by mouth daily.    [provider]  potassium chloride SA (K-DUR,KLOR-CON) 20 MEQ tablet Take 1 tablet (20 mEq total) by mouth daily. 06/17/16   Eber Jones, MD  traMADol (ULTRAM) 50 MG tablet Take 1 tablet (50 mg total) by mouth every 6 (six) hours as needed. 10/06/17   Evalee Jefferson, PA-C    Family History Family History  Problem Relation Age of Onset  . Colon cancer Paternal Grandfather   . Cancer Father   . Cancer Maternal Uncle   . Alzheimer's disease Paternal Aunt   . Cirrhosis Maternal Uncle   . Cancer Paternal Aunt        lung  . Heart disease  Brother   . Heart attack Brother   . Mental illness Daughter   . ADD / ADHD Daughter   . Bipolar disorder Daughter   . Mental illness Son   . ADD / ADHD Son   . Bipolar disorder Son   . Mental illness Son   . ADD / ADHD Son   . Bipolar disorder Son     Social History Social History   Tobacco Use  . Smoking status: Current Some Day Smoker    Packs/day: 0.20    Years: 32.00    Pack years: 6.40    Types: Cigarettes, Cigars    Start date: 07/05/1983    Last attempt to quit: 04/03/2012    Years since quitting: 5.5  . Smokeless tobacco: Never Used  Substance Use Topics  . Alcohol use: No    Alcohol/week: 0.0 oz  . Drug use: No     Allergies   Bee venom; Penicillins; and Shellfish allergy   Review of Systems Review of Systems  Constitutional: Negative for activity change, appetite change, chills and fever.  HENT: Negative for congestion, sore throat, trouble swallowing and voice change.   Eyes: Negative.   Respiratory: Negative for chest tightness and shortness of breath.   Cardiovascular: Negative for chest pain.  Gastrointestinal: Negative for abdominal pain, nausea and vomiting.  Genitourinary: Negative.   Musculoskeletal: Positive for neck pain. Negative for arthralgias and joint swelling.  Skin: Negative.  Negative for rash and wound.  Neurological: Negative for dizziness, weakness, light-headedness, numbness and headaches.  Psychiatric/Behavioral: Negative.      Physical Exam Updated Vital Signs BP (!) 184/85 (BP Location: Left Arm)   Pulse 68   Temp 98.4 F (36.9 C) (Oral)   Resp 16   Ht 5\' 4"  (1.626 m)   Wt 68.9 kg (152 lb)   SpO2 100%   BMI 26.09 kg/m  Physical Exam  Constitutional: She appears well-developed and well-nourished.  HENT:  Head: Normocephalic and atraumatic.  Right Ear: Tympanic membrane and external ear normal.  Left Ear: Tympanic membrane and external ear normal.  Nose: Nose normal.  Mouth/Throat: Uvula is midline, oropharynx  is clear and moist and mucous membranes are normal. No oral lesions. Normal dentition. No dental abscesses. No oropharyngeal exudate, posterior oropharyngeal edema or posterior oropharyngeal erythema.  Eyes: Conjunctivae are normal.  Neck: Trachea normal, normal range of motion and phonation normal. Neck supple. No JVD present. No spinous process tenderness and no muscular tenderness present. Carotid bruit is not present. Edema present. No tracheal deviation, no erythema and normal range of motion present.  ttp with fullness noted mid right anteriolateral neck near anterior cervical chain.  No induration, no erythema.  Difficult exam as patient will only allow brief palpation due to pain.   Cardiovascular: Normal rate, regular rhythm and normal heart sounds.  Pulmonary/Chest: Effort normal and breath sounds normal.  Musculoskeletal: Normal range of motion.  Neurological: She is alert.  Skin: Skin is warm and dry.  Psychiatric:  Anxious   Nursing note and vitals reviewed.    ED Treatments / Results  Labs (all labs ordered are listed, but only abnormal results are displayed) Results for orders placed or performed during the hospital encounter of 38/10/17  Basic metabolic panel  Result Value Ref Range   Sodium 140 135 - 145 mmol/L   Potassium 3.4 (L) 3.5 - 5.1 mmol/L   Chloride 106 98 - 111 mmol/L   CO2 28 22 - 32 mmol/L   Glucose, Bld 92 70 - 99 mg/dL   BUN 13 6 - 20 mg/dL   Creatinine, Ser 0.80 0.44 - 1.00 mg/dL   Calcium 9.4 8.9 - 10.3 mg/dL   GFR calc non Af Amer >60 >60 mL/min   GFR calc Af Amer >60 >60 mL/min   Anion gap 6 5 - 15  CBC with Differential  Result Value Ref Range   WBC 6.4 4.0 - 10.5 K/uL   RBC 4.88 3.87 - 5.11 MIL/uL   Hemoglobin 13.6 12.0 - 15.0 g/dL   HCT 41.5 36.0 - 46.0 %   MCV 85.0 78.0 - 100.0 fL   MCH 27.9 26.0 - 34.0 pg   MCHC 32.8 30.0 - 36.0 g/dL   RDW 14.3 11.5 - 15.5 %   Platelets 221 150 - 400 K/uL   Neutrophils Relative % 34 %   Neutro Abs  2.2 1.7 - 7.7 K/uL   Lymphocytes Relative 60 %   Lymphs Abs 3.9 0.7 - 4.0 K/uL   Monocytes Relative 4 %   Monocytes Absolute 0.3 0.1 - 1.0 K/uL   Eosinophils Relative 1 %   Eosinophils Absolute 0.1 0.0 - 0.7 K/uL   Basophils Relative 1 %   Basophils Absolute 0.0 0.0 - 0.1 K/uL   Ct Soft Tissue Neck W Contrast  Result Date: 10/06/2017 CLINICAL DATA:  Neck mass, solitary, afebrile. Neck pain with movement and a knot since Saturday. EXAM: CT NECK WITH CONTRAST TECHNIQUE: Multidetector CT imaging of the neck was performed using the standard protocol following the bolus administration of intravenous contrast. CONTRAST:  49mL ISOVUE-300 IOPAMIDOL (ISOVUE-300) INJECTION 61% COMPARISON:  Cervical spine MRI 02/08/2013 FINDINGS: Pharynx and larynx: Low-density in the right nasopharynx correlates on a proteinaceous cyst on 2017 brain MRI. No inflammatory changes or concerning masslike enhancement Salivary glands: Patient's palpable marker projects over the right parotid tail, which is unremarkable. Thyroid: 18  mm solid left thyroid nodule which has enlarged from 11 mm in 2014. Smaller right posterior lobe nodule has also enlarged from prior. Lymph nodes: Mild prominence of upper jugular lymph nodes, but stable from a 2014 cervical MRI and likely physiologic. Vascular: No acute finding.  Mild atherosclerotic calcification Limited intracranial: Negative Visualized orbits: Negative Mastoids and visualized paranasal sinuses: Clear Skeleton: No acute or aggressive finding Upper chest: Negative IMPRESSION: 1. No mass or inflammation to explain palpable complaint in the right neck. Palpable markers overlap the unremarkable right parotid tail. There are mildly prominent regional lymph nodes, but symmetric to the left and stable from a 2014 cervical MRI. 2. Bilateral thyroid nodules, up to 18 mm on the left. Both have enlarged since 2014, recommend outpatient thyroid sonography. Electronically Signed   By: Monte Fantasia  M.D.   On: 10/06/2017 17:10     EKG None  Radiology Ct Soft Tissue Neck W Contrast  Result Date: 10/06/2017 CLINICAL DATA:  Neck mass, solitary, afebrile. Neck pain with movement and a knot since Saturday. EXAM: CT NECK WITH CONTRAST TECHNIQUE: Multidetector CT imaging of the neck was performed using the standard protocol following the bolus administration of intravenous contrast. CONTRAST:  43mL ISOVUE-300 IOPAMIDOL (ISOVUE-300) INJECTION 61% COMPARISON:  Cervical spine MRI 02/08/2013 FINDINGS: Pharynx and larynx: Low-density in the right nasopharynx correlates on a proteinaceous cyst on 2017 brain MRI. No inflammatory changes or concerning masslike enhancement Salivary glands: Patient's palpable marker projects over the right parotid tail, which is unremarkable. Thyroid: 18 mm solid left thyroid nodule which has enlarged from 11 mm in 2014. Smaller right posterior lobe nodule has also enlarged from prior. Lymph nodes: Mild prominence of upper jugular lymph nodes, but stable from a 2014 cervical MRI and likely physiologic. Vascular: No acute finding.  Mild atherosclerotic calcification Limited intracranial: Negative Visualized orbits: Negative Mastoids and visualized paranasal sinuses: Clear Skeleton: No acute or aggressive finding Upper chest: Negative IMPRESSION: 1. No mass or inflammation to explain palpable complaint in the right neck. Palpable markers overlap the unremarkable right parotid tail. There are mildly prominent regional lymph nodes, but symmetric to the left and stable from a 2014 cervical MRI. 2. Bilateral thyroid nodules, up to 18 mm on the left. Both have enlarged since 2014, recommend outpatient thyroid sonography. Electronically Signed   By: Monte Fantasia M.D.   On: 10/06/2017 17:10    Procedures Procedures (including critical care time)  Medications Ordered in ED Medications  ketorolac (TORADOL) 30 MG/ML injection 15 mg (15 mg Intravenous Given 10/06/17 1654)  iopamidol  (ISOVUE-300) 61 % injection 75 mL (75 mLs Intravenous Contrast Given 10/06/17 1643)     Initial Impression / Assessment and Plan / ED Course  I have reviewed the triage vital signs and the nursing notes.  Pertinent labs & imaging results that were available during my care of the patient were reviewed by me and considered in my medical decision making (see chart for details).     Prior to dc pt states she has been frequently taking bc or goody powders, excedrin migraine for her neck pain and also for  chronic headaches and has had recent nose bleeds.  She is taking Brilinta for her CAD hx. She reports compliance with her bp meds and is scheduled for additional lab tests next week due to persistent elevated BP to assess for renal source of htn.  Advised she needs to avoid asa and nsaid medications.  Prescribed tramadol. Advised f/u with pcp for further eval  and to arrange [place] of her thyroid.   Right neck pain of unclear etiology, no acute findings on CT imaging.  Chronic prominent lymph nodes but equal to the left.  Thyroid enlargement but more so on the left.  No metastatic lesions, no mass or obvious vascular lesion.  Final Clinical Impressions(s) / ED Diagnoses   Final diagnoses:  Neck pain    ED Discharge Orders        Ordered    traMADol (ULTRAM) 50 MG tablet  Every 6 hours PRN     10/06/17 1737       Evalee Jefferson, PA-C 10/06/17 1713    Evalee Jefferson, PA-C 10/06/17 1750    Long, Wonda Olds, MD 10/07/17 1350

## 2017-10-06 NOTE — Discharge Instructions (Addendum)
Your CT of the soft tissue structures of your neck do not show an obvious source for your right sided neck symptoms.  Your thyroid gland is enlarged, but more so on the left than the right.  It is recommended you have an ultrasound for further evaluation of your thyroid gland which your doctor can schedule for you.  Please call his office tomorrow to arrange this and for followup care with him.   Also as discussed, stop taking bc powders, goodie powders, Excedrin migraine and no ibuprofen (motrin or advil) and no Aleve as these medicines can adversely interfere with your Brilinta and make you more susceptible to bleeding. You may take the tramadol prescribed for pain relief as needed.  This will make you drowsy - do not drive within 4 hours of taking this medication.

## 2017-10-06 NOTE — ED Triage Notes (Signed)
Pt c/o of neck pain with movement with a "knot" since Saturday. Pt saw PCP last week and stated she was told have her neck evaluated.

## 2017-10-10 ENCOUNTER — Ambulatory Visit (INDEPENDENT_AMBULATORY_CARE_PROVIDER_SITE_OTHER): Payer: Medicare HMO

## 2017-10-10 VITALS — BP 160/84 | HR 74 | Ht 64.0 in | Wt 151.0 lb

## 2017-10-10 DIAGNOSIS — Z0130 Encounter for examination of blood pressure without abnormal findings: Secondary | ICD-10-CM | POA: Diagnosis not present

## 2017-10-10 DIAGNOSIS — I10 Essential (primary) hypertension: Secondary | ICD-10-CM | POA: Diagnosis not present

## 2017-10-10 DIAGNOSIS — I25119 Atherosclerotic heart disease of native coronary artery with unspecified angina pectoris: Secondary | ICD-10-CM | POA: Diagnosis not present

## 2017-10-10 LAB — BASIC METABOLIC PANEL
BUN: 9 mg/dL (ref 7–25)
CALCIUM: 9.6 mg/dL (ref 8.6–10.4)
CHLORIDE: 105 mmol/L (ref 98–110)
CO2: 26 mmol/L (ref 20–32)
Creat: 0.86 mg/dL (ref 0.50–1.05)
Glucose, Bld: 87 mg/dL (ref 65–139)
POTASSIUM: 4.2 mmol/L (ref 3.5–5.3)
SODIUM: 140 mmol/L (ref 135–146)

## 2017-10-10 NOTE — Patient Instructions (Signed)
We will call you with medication changes      Thank you for choosing [facility] !

## 2017-10-10 NOTE — Progress Notes (Signed)
Arrives early for BP check, states she was in ED on 6/27 for neck swelling.Told her BP was elevated.States her BP has not been controlled since she left.Is watching sodium intact

## 2017-10-11 ENCOUNTER — Telehealth: Payer: Self-pay | Admitting: *Deleted

## 2017-10-11 ENCOUNTER — Telehealth: Payer: Self-pay

## 2017-10-11 MED ORDER — METOPROLOL TARTRATE 50 MG PO TABS: 50.0000 mg | ORAL_TABLET | Freq: Two times a day (BID) | ORAL | 3 refills | Status: DC

## 2017-10-11 NOTE — Telephone Encounter (Signed)
Called patient with test results. No answer. Left message to call back.  

## 2017-10-11 NOTE — Telephone Encounter (Signed)
-----   Message from Imogene Burn, PA-C sent at 10/11/2017  8:37 AM EDT ----- Labs look good. No changes

## 2017-10-11 NOTE — Telephone Encounter (Signed)
Pt will increase metoprolol to 50 mg bid and return in 2 weeks for bp/HR check

## 2017-10-11 NOTE — Telephone Encounter (Signed)
-----   Message from Imogene Burn, PA-C sent at 10/11/2017  8:49 AM EDT ----- Please ask patient to increase her metoprolol to 50 mg BID. Come back in 2 weeks for BP and pulse check Thanks, Selinda Eon ----- Message ----- From: Bernita Raisin, RN Sent: 10/10/2017   9:32 AM To: Imogene Burn, PA-C

## 2017-10-12 ENCOUNTER — Other Ambulatory Visit (HOSPITAL_COMMUNITY): Payer: Self-pay | Admitting: Internal Medicine

## 2017-10-12 DIAGNOSIS — E049 Nontoxic goiter, unspecified: Secondary | ICD-10-CM

## 2017-10-12 DIAGNOSIS — E041 Nontoxic single thyroid nodule: Secondary | ICD-10-CM

## 2017-10-18 ENCOUNTER — Ambulatory Visit (HOSPITAL_COMMUNITY)
Admission: RE | Admit: 2017-10-18 | Discharge: 2017-10-18 | Disposition: A | Discharge status: Home / Self Care | Payer: Medicare HMO | Source: Ambulatory Visit | Attending: Internal Medicine | Admitting: Internal Medicine

## 2017-10-18 DIAGNOSIS — E042 Nontoxic multinodular goiter: Secondary | ICD-10-CM | POA: Insufficient documentation

## 2017-10-18 DIAGNOSIS — E049 Nontoxic goiter, unspecified: Secondary | ICD-10-CM | POA: Diagnosis present

## 2017-10-18 DIAGNOSIS — E041 Nontoxic single thyroid nodule: Secondary | ICD-10-CM

## 2017-10-26 ENCOUNTER — Telehealth: Payer: Self-pay

## 2017-10-26 ENCOUNTER — Ambulatory Visit (INDEPENDENT_AMBULATORY_CARE_PROVIDER_SITE_OTHER): Payer: Medicare HMO

## 2017-10-26 VITALS — BP 152/82 | HR 61 | Wt 151.0 lb

## 2017-10-26 DIAGNOSIS — I10 Essential (primary) hypertension: Secondary | ICD-10-CM | POA: Diagnosis not present

## 2017-10-26 NOTE — Telephone Encounter (Signed)
Called pt to let her know she can take Tylenol for a few days. Pt did not answer phone, but  I did leave message on private voicemail .

## 2017-10-26 NOTE — Progress Notes (Signed)
Pt came in today for a blood pressure & hr check. Her metoprolol was increased 2 weeks ago to 50 mg BID. She has had a death in the family which has been stressful for her, but her blood pressure is a lot better than it was 2 weeks ago. She complains that the increase of metoprolol has caused her to have awful headaches. She would like to know what she can take for them as she is not suppose to take OTC Advil or tylenol. Please advise.

## 2017-10-26 NOTE — Telephone Encounter (Signed)
-----   Message from Burtis Junes, NP sent at 10/26/2017  4:11 PM EDT ----- I think Tylenol OTC for a few days would be ok. She has normal kidney function.  Selinda Eon is not back until next week if this is not satisfactory for the patient.   Thanks Cecille Rubin ----- Message ----- From: Drema Dallas, CMA Sent: 10/26/2017   3:10 PM To: Burtis Junes, NP  She stated that Ermalinda Barrios, PA told her to avoid it due to her kidney problems. ??   Lattie Haw  ----- Message ----- From: Burtis Junes, NP Sent: 10/26/2017  12:40 PM To: Drema Dallas, CMA  Reviewing for Ermalinda Barrios PA in her absence.   Why can the patient not take Tylenol?  Burtis Junes, RN, [facility] [address] [place], [place]  [address] [phone]  ----- Message ----- From: Drema Dallas, CMA Sent: 10/26/2017   9:10 AM To: Imogene Burn, PA-C

## 2017-11-07 DIAGNOSIS — E118 Type 2 diabetes mellitus with unspecified complications: Secondary | ICD-10-CM | POA: Diagnosis not present

## 2017-11-28 DIAGNOSIS — E119 Type 2 diabetes mellitus without complications: Secondary | ICD-10-CM | POA: Diagnosis not present

## 2017-11-29 ENCOUNTER — Other Ambulatory Visit: Payer: Medicare Other | Admitting: Adult Health

## 2017-12-01 DIAGNOSIS — M539 Dorsopathy, unspecified: Secondary | ICD-10-CM | POA: Diagnosis not present

## 2017-12-01 DIAGNOSIS — M25552 Pain in left hip: Secondary | ICD-10-CM | POA: Diagnosis not present

## 2017-12-01 DIAGNOSIS — Z993 Dependence on wheelchair: Secondary | ICD-10-CM | POA: Diagnosis not present

## 2017-12-01 DIAGNOSIS — H3530 Unspecified macular degeneration: Secondary | ICD-10-CM | POA: Diagnosis not present

## 2017-12-01 DIAGNOSIS — G40802 Other epilepsy, not intractable, without status epilepticus: Secondary | ICD-10-CM | POA: Diagnosis not present

## 2017-12-01 DIAGNOSIS — M79605 Pain in left leg: Secondary | ICD-10-CM | POA: Diagnosis not present

## 2017-12-01 DIAGNOSIS — G8929 Other chronic pain: Secondary | ICD-10-CM | POA: Diagnosis not present

## 2017-12-11 DIAGNOSIS — I10 Essential (primary) hypertension: Secondary | ICD-10-CM | POA: Diagnosis not present

## 2017-12-11 DIAGNOSIS — Z9282 Status post administration of tPA (rtPA) in a different facility within the last 24 hours prior to admission to current facility: Secondary | ICD-10-CM | POA: Diagnosis not present

## 2017-12-11 DIAGNOSIS — R4781 Slurred speech: Secondary | ICD-10-CM | POA: Diagnosis not present

## 2017-12-11 DIAGNOSIS — E785 Hyperlipidemia, unspecified: Secondary | ICD-10-CM | POA: Diagnosis not present

## 2017-12-11 DIAGNOSIS — R29706 NIHSS score 6: Secondary | ICD-10-CM | POA: Diagnosis not present

## 2017-12-11 DIAGNOSIS — Z853 Personal history of malignant neoplasm of breast: Secondary | ICD-10-CM | POA: Diagnosis not present

## 2017-12-11 DIAGNOSIS — E039 Hypothyroidism, unspecified: Secondary | ICD-10-CM | POA: Diagnosis not present

## 2017-12-11 DIAGNOSIS — E119 Type 2 diabetes mellitus without complications: Secondary | ICD-10-CM | POA: Diagnosis not present

## 2017-12-11 DIAGNOSIS — Z79899 Other long term (current) drug therapy: Secondary | ICD-10-CM | POA: Diagnosis not present

## 2017-12-11 DIAGNOSIS — J352 Hypertrophy of adenoids: Secondary | ICD-10-CM | POA: Diagnosis not present

## 2017-12-11 DIAGNOSIS — I639 Cerebral infarction, unspecified: Secondary | ICD-10-CM | POA: Diagnosis not present

## 2017-12-11 DIAGNOSIS — G8191 Hemiplegia, unspecified affecting right dominant side: Secondary | ICD-10-CM | POA: Diagnosis not present

## 2017-12-11 DIAGNOSIS — Z8543 Personal history of malignant neoplasm of ovary: Secondary | ICD-10-CM | POA: Diagnosis not present

## 2017-12-11 DIAGNOSIS — I6789 Other cerebrovascular disease: Secondary | ICD-10-CM | POA: Diagnosis not present

## 2017-12-11 DIAGNOSIS — R29818 Other symptoms and signs involving the nervous system: Secondary | ICD-10-CM | POA: Diagnosis not present

## 2017-12-11 DIAGNOSIS — G459 Transient cerebral ischemic attack, unspecified: Secondary | ICD-10-CM | POA: Diagnosis not present

## 2017-12-11 DIAGNOSIS — R29810 Facial weakness: Secondary | ICD-10-CM | POA: Diagnosis not present

## 2017-12-11 DIAGNOSIS — E876 Hypokalemia: Secondary | ICD-10-CM | POA: Diagnosis not present

## 2017-12-11 DIAGNOSIS — F17200 Nicotine dependence, unspecified, uncomplicated: Secondary | ICD-10-CM | POA: Diagnosis not present

## 2017-12-11 DIAGNOSIS — Z720 Tobacco use: Secondary | ICD-10-CM | POA: Diagnosis not present

## 2017-12-11 DIAGNOSIS — R531 Weakness: Secondary | ICD-10-CM | POA: Diagnosis not present

## 2017-12-11 DIAGNOSIS — R040 Epistaxis: Secondary | ICD-10-CM | POA: Diagnosis not present

## 2017-12-11 DIAGNOSIS — R569 Unspecified convulsions: Secondary | ICD-10-CM | POA: Diagnosis not present

## 2017-12-11 DIAGNOSIS — R404 Transient alteration of awareness: Secondary | ICD-10-CM | POA: Diagnosis not present

## 2017-12-12 DIAGNOSIS — R29818 Other symptoms and signs involving the nervous system: Secondary | ICD-10-CM | POA: Diagnosis not present

## 2017-12-12 DIAGNOSIS — R930 Abnormal findings on diagnostic imaging of skull and head, not elsewhere classified: Secondary | ICD-10-CM | POA: Diagnosis not present

## 2017-12-12 DIAGNOSIS — I639 Cerebral infarction, unspecified: Secondary | ICD-10-CM | POA: Diagnosis not present

## 2017-12-12 DIAGNOSIS — E039 Hypothyroidism, unspecified: Secondary | ICD-10-CM | POA: Diagnosis not present

## 2017-12-12 DIAGNOSIS — I351 Nonrheumatic aortic (valve) insufficiency: Secondary | ICD-10-CM | POA: Diagnosis not present

## 2017-12-12 DIAGNOSIS — E785 Hyperlipidemia, unspecified: Secondary | ICD-10-CM | POA: Diagnosis not present

## 2017-12-12 DIAGNOSIS — I519 Heart disease, unspecified: Secondary | ICD-10-CM | POA: Diagnosis not present

## 2017-12-12 DIAGNOSIS — I517 Cardiomegaly: Secondary | ICD-10-CM | POA: Diagnosis not present

## 2017-12-12 DIAGNOSIS — E876 Hypokalemia: Secondary | ICD-10-CM | POA: Diagnosis not present

## 2017-12-12 DIAGNOSIS — I10 Essential (primary) hypertension: Secondary | ICD-10-CM | POA: Diagnosis not present

## 2017-12-13 DIAGNOSIS — M539 Dorsopathy, unspecified: Secondary | ICD-10-CM | POA: Diagnosis not present

## 2017-12-13 DIAGNOSIS — G8929 Other chronic pain: Secondary | ICD-10-CM | POA: Diagnosis not present

## 2017-12-13 DIAGNOSIS — G40802 Other epilepsy, not intractable, without status epilepticus: Secondary | ICD-10-CM | POA: Diagnosis not present

## 2017-12-13 DIAGNOSIS — R569 Unspecified convulsions: Secondary | ICD-10-CM | POA: Diagnosis not present

## 2017-12-13 DIAGNOSIS — H3530 Unspecified macular degeneration: Secondary | ICD-10-CM | POA: Diagnosis not present

## 2017-12-13 DIAGNOSIS — G459 Transient cerebral ischemic attack, unspecified: Secondary | ICD-10-CM | POA: Diagnosis not present

## 2017-12-20 DIAGNOSIS — G459 Transient cerebral ischemic attack, unspecified: Secondary | ICD-10-CM | POA: Diagnosis not present

## 2017-12-20 DIAGNOSIS — H5461 Unqualified visual loss, right eye, normal vision left eye: Secondary | ICD-10-CM | POA: Diagnosis not present

## 2017-12-20 DIAGNOSIS — H04123 Dry eye syndrome of bilateral lacrimal glands: Secondary | ICD-10-CM | POA: Diagnosis not present

## 2017-12-20 DIAGNOSIS — H527 Unspecified disorder of refraction: Secondary | ICD-10-CM | POA: Diagnosis not present

## 2017-12-22 DIAGNOSIS — E785 Hyperlipidemia, unspecified: Secondary | ICD-10-CM | POA: Diagnosis not present

## 2017-12-22 DIAGNOSIS — I10 Essential (primary) hypertension: Secondary | ICD-10-CM | POA: Diagnosis not present

## 2017-12-22 DIAGNOSIS — Z23 Encounter for immunization: Secondary | ICD-10-CM | POA: Diagnosis not present

## 2017-12-22 DIAGNOSIS — G459 Transient cerebral ischemic attack, unspecified: Secondary | ICD-10-CM | POA: Diagnosis not present

## 2017-12-27 ENCOUNTER — Telehealth: Payer: Self-pay | Admitting: Cardiology

## 2017-12-27 NOTE — Telephone Encounter (Signed)
Noted. Notes in care everywhere will forward as [facility]

## 2017-12-27 NOTE — Telephone Encounter (Signed)
Pt called stating she has had a mini stroke and was seen in [facility], she wanted Dr. Domenic Polite to be aware for her apt next week

## 2018-01-03 DIAGNOSIS — F4310 Post-traumatic stress disorder, unspecified: Secondary | ICD-10-CM | POA: Diagnosis not present

## 2018-01-04 NOTE — Progress Notes (Signed)
Cardiology Office Note  Date: 01/05/2018   ID: [name], DOB [birth year], MRN [id number]  PCP: [name], MD  Primary Cardiologist: [name], MD   Chief Complaint  Patient presents with  . Cardiac follow-up    History of Present Illness: Bethany Wang is a 52 y.o. female last seen by Ms. [name] in June.  At that time she had been noncompliant with medical therapy with uncontrolled hypertension.  Adjustments were made in her medication doses.  Current blood pressure check within a few weeks showed better control overall, although not optimal.  I reviewed interval records.  She was recently hospitalized at [facility] in transfer from [facility] with right sided hemiparesis, dysarthria, and blurred vision concerning for acute stroke.  No acute intracranial abnormality noted by head CT imaging, she did receive IV TPA per tele-neurology consultation.  Subsequent brain MRI showed no acute stroke.  She had no significant carotid, vertebral, or intracranial stenoses by angiography.  She also remained in sinus rhythm throughout with follow-up echocardiogram revealing LVEF 60 to 65% and no obvious intracardiac shunt.  EEG also negative for seizures.  She was diagnosed with possible TIA and placed on Plavix, also continued on statin therapy, LDL was 122.  She presents today for a follow-up visit, states that she has been doing well despite the recent events.  She continues to follow with Neurology and also a behavioral health specialist through the [facility] system.  She does not report any significant angina symptoms or palpitations.  She brought in all of her medications today and states that she has been taking them regularly.  She also has scheduled follow-up with Dr. [name].  Her blood pressure is much better controlled today.  She states that she and her husband have been talking more rather than arguing and that her stress level is less overall.  Past  Medical History:  Diagnosis Date  . Anemia   . Bilateral breast cysts 12/30/2015  . Breast cancer ([place])   . BV (bacterial vaginosis) 09/09/2015  . Coronary atherosclerosis of native coronary artery    a. s/p DES to RCA in 2013 b. DES to LAD in 2014 c. cath in 04/2016 showing patent LAD stent with D2 jailed by LAD stent and CTO of RCA with left to right collaterals present  . Cyst of pharynx or nasopharynx    [name]  . Essential hypertension   . Falls    x 3-4 in past year  . GERD (gastroesophageal reflux disease)   . History of breast cancer 09/09/2015  . History of hematuria   . Mixed hyperlipidemia   . PUD (peptic ulcer disease)   . Seizures ([place])   . Shingles 09/09/2015  . Suicide attempt ([place])    3 attempts in remote past  . Type 2 diabetes mellitus ([place])   . Uterine cancer [facility])     Past Surgical History:  Procedure Laterality Date  . ABDOMINAL HYSTERECTOMY    . BLADDER SURGERY    . CARDIAC CATHETERIZATION N/A 05/10/2016   Procedure: Left Heart Cath and Coronary Angiography;  Surgeon: Belva Crome, MD;  Location: [place] CV LAB;  Service: Cardiovascular;  Laterality: N/A;  . COLONOSCOPY  May 2010   Fleishman: normal rectum, internal hemorrhoids, , benign colonic polyp  . COLONOSCOPY N/A 01/05/2017   Procedure: COLONOSCOPY;  Surgeon: Rogene Houston, MD;  [facility];  Service: Endoscopy;  Laterality: N/A;  1:00  .  ESOPHAGOGASTRODUODENOSCOPY     2001 Dr. Amedeo Plenty: distal esophagitis, small hiatal hernia,. Dr. Tamala Julian 2006? no records available currently, pt also reports  EGD a few years ago with Dr. Gala Romney, do not have these reports anywhere in medical records  . ESOPHAGOGASTRODUODENOSCOPY  06/02/2011   LSL:[id number] pill impaction as described above s/p dilation of a probable cervical esophageal web/bx abnormal esophageal and gastric mucosa. + H.pylori gastritis   . EYE SURGERY     Removed glass  . HAND SURGERY Right   . Left breast lumpectomy      Benign  . LEFT HEART CATHETERIZATION WITH CORONARY ANGIOGRAM N/A 07/03/2014   Procedure: LEFT HEART CATHETERIZATION WITH CORONARY ANGIOGRAM;  Surgeon: Belva Crome, MD;  Location: [facility] CATH LAB;  Service: Cardiovascular;  Laterality: N/A;  . POLYPECTOMY  01/05/2017   Procedure: POLYPECTOMY;  Surgeon: Rogene Houston, MD;  [facility];  Service: Endoscopy;;  colon  . SHOULDER SURGERY Left     Current Outpatient Medications  Medication Sig Dispense Refill  . ALPRAZolam (XANAX) 0.5 MG tablet Take 0.5 mg by mouth 2 (two) times daily.    [name] amLODipine (NORVASC) 5 MG tablet Take 1 tablet by mouth daily.    [name] atorvastatin (LIPITOR) 40 MG tablet Take 1 tablet (40 mg total) by mouth daily at 6 PM. 30 tablet 0  . clopidogrel (PLAVIX) 75 MG tablet Take 1 tablet by mouth daily.    [name] EPINEPHrine 0.3 mg/0.3 mL IJ SOAJ injection Inject 0.3 mg into the muscle as needed (allergic reaction).     [name] escitalopram (LEXAPRO) 20 MG tablet Take 20 mg by mouth daily.     . isosorbide mononitrate (IMDUR) 30 MG 24 hr tablet Take 3 tablets (90 mg total) by mouth daily. 90 tablet 6  . metoprolol tartrate (LOPRESSOR) 50 MG tablet Take 1 tablet (50 mg total) by mouth 2 (two) times daily. 180 tablet 3  . mometasone (ASMANEX, 30 METERED DOSES,) 220 MCG/INH inhaler Inhale 1 puff into the lungs 2 (two) times daily.    . Multiple Vitamin (MULTIVITAMIN WITH MINERALS) TABS tablet Take 1 tablet by mouth daily.    [name] NITROSTAT 0.4 MG SL tablet PLACE 1 TAB UNDER TONGUE EVERY 5 MIN UP TO 3 TIMES AS NEEDED FOR CHEST PAIN, IF NO RELIEF CALL 911. 25 tablet 0  . pantoprazole (PROTONIX) 40 MG tablet Take 1 tablet by mouth daily.    . potassium chloride SA (K-DUR,KLOR-CON) 20 MEQ tablet Take 1 tablet (20 mEq total) by mouth daily. 30 tablet 0  . thyroid (ARMOUR) 60 MG tablet Take 1 tablet by mouth daily.    [name] topiramate (TOPAMAX) 25 MG tablet Take 1 tablet by mouth 2 (two) times daily.    . traMADol (ULTRAM) 50 MG tablet Take  1 tablet (50 mg total) by mouth every 6 (six) hours as needed. 20 tablet 0   No current facility-administered medications for this visit.    Allergies:  Bee venom; Penicillins; and Shellfish allergy   Social History: The patient  reports that she has been smoking cigarettes and cigars. She started smoking about 34 years ago. She has a 6.40 pack-year smoking history. She has never used smokeless tobacco. She reports that she does not drink alcohol or use drugs.   ROS:  Please see the history of present illness. Otherwise, complete review of systems is positive for intermittent headaches.  All other systems are reviewed and negative.   Physical Exam: VS:  BP 100/72  Pulse 75   Ht 5\' 4"  (1.626 m)   Wt 150 lb (68 kg)   SpO2 97%   BMI 25.75 kg/m , BMI Body mass index is 25.75 kg/m.  Wt Readings from Last 3 Encounters:  01/05/18 150 lb (68 kg)  10/26/17 151 lb (68.5 kg)  10/10/17 151 lb (68.5 kg)    General: Patient appears comfortable at rest. HEENT: Conjunctiva and lids normal, oropharynx clear. Neck: Supple, no elevated JVP or carotid bruits, no thyromegaly. Lungs: Clear to auscultation, nonlabored breathing at rest. Cardiac: Regular rate and rhythm, no S3 or significant systolic murmur. Abdomen: Soft, nontender, bowel sounds present. Extremities: No pitting edema, distal pulses 2+. Skin: Warm and dry. Musculoskeletal: No kyphosis. Neuropsychiatric: Alert and oriented x3, affect grossly appropriate.  ECG: I personally reviewed the tracing from 07/13/2017 which showed sinus rhythm with increased voltage.  Recent Labwork: 07/14/2017: Magnesium 2.2 10/06/2017: Hemoglobin 13.6; Platelets 221 10/10/2017: BUN 9; Creat 0.86; Potassium 4.2; Sodium 140     Component Value Date/Time   CHOL 182 07/14/2017 0302   TRIG 218 (H) 07/14/2017 0302   HDL 47 07/14/2017 0302   CHOLHDL 3.9 07/14/2017 0302   VLDL 44 (H) 07/14/2017 0302   LDLCALC 91 07/14/2017 0302  September 2019: Cholesterol 193,  triglycerides 95, HDL 52, LDL 122  Other Studies Reviewed Today:  Echocardiogram 12/12/2017 [facility]): Left Ventricle The left ventricle is normal in size, wall thickness and wall motion with ejection fraction of 60-65%. Grade I mild diastolic dysfunction; abnormal relaxation pattern.  Right Ventricle The right ventricle is grossly normal in size and function.  Atria The left atrium is normal size. Injection of contrast documented no interatrial shunt . The right atrium is mildly dilated.  Mitral Valve The mitral valve is normal in structure and function. There is trace mitral regurgitation.  Tricuspid Valve The tricuspid valve leaflets are thin and pliable. There is trace tricuspid regurgitation. Estimation of right ventricular systolic pressure is not possible.  Aortic Valve The aortic valve is trileaflet with thin, pliable leaflets that move normally. There is mild to moderate [1-2+] aortic regurgitation present.  Pulmonic Valve The pulmonic valve is not well visualized. There is no pulmonic regurgitation.  Vessels The aortic root is normal in diameter.  Pericardium There is no pericardial effusion.  Brain MRI 12/11/2017 [facility]): FINDINGS:  There is no acute cortical infarct, intracranial hemorrhage, mass, or mass-effect.   The extra-axial spaces, basal cisterns, ventricles and sulci are normal. Intracranial flow-voids appear normal.   The orbits, cranium, mastoid sinuses, and visualized paranasal sinuses are unremarkable.   Incidental 13 mm Tornwaldt cyst in the nasopharynx.  Assessment and Plan:  1.  Multivessel CAD with with prior history of DES to the RCA in 2013 and DES to the LAD in 2014.  Cardiac catheterization from last year revealed patent LAD stent site with 75% second diagonal stenosis, occluded RCA at prior stent site with left-to-right collaterals, and 75 to 80% small obtuse marginal stenosis.  Medical therapy is recommended.  I reviewed her recent  medication adjustments.  She will continue on Plavix, beta-blocker, Imdur, and statin therapy.  2.  Recent TIA with work-up at [facility].  Records reviewed.  She continues to follow with Neurology.  3.  Hyperlipidemia, back on Lipitor, history of medication noncompliance complicates management.  Her LDL was 122 recently at [facility].  We will plan to repeat levels around the time of her next visit.  4.  Hypertension, blood pressure is much better controlled today.  Continue with current regimen.  Keep follow-up with Dr. [name].  Current medicines were reviewed with the patient today.  Disposition: Follow-up in 3 months.  Signed, Satira Sark, MD, [facility] 01/05/2018 3:17 PM    [facility] [address], [place], [place] [address] Phone: [phone]; Fax: [phone]

## 2018-01-05 ENCOUNTER — Ambulatory Visit (INDEPENDENT_AMBULATORY_CARE_PROVIDER_SITE_OTHER): Payer: Medicare HMO | Admitting: Cardiology

## 2018-01-05 ENCOUNTER — Encounter: Payer: Self-pay | Admitting: Cardiology

## 2018-01-05 VITALS — BP 100/72 | HR 75 | Ht 64.0 in | Wt 150.0 lb

## 2018-01-05 DIAGNOSIS — Z8673 Personal history of transient ischemic attack (TIA), and cerebral infarction without residual deficits: Secondary | ICD-10-CM

## 2018-01-05 DIAGNOSIS — I25119 Atherosclerotic heart disease of native coronary artery with unspecified angina pectoris: Secondary | ICD-10-CM | POA: Diagnosis not present

## 2018-01-05 DIAGNOSIS — E782 Mixed hyperlipidemia: Secondary | ICD-10-CM | POA: Diagnosis not present

## 2018-01-05 DIAGNOSIS — I10 Essential (primary) hypertension: Secondary | ICD-10-CM | POA: Diagnosis not present

## 2018-01-05 NOTE — Patient Instructions (Signed)
Your physician wants you to follow-up in: [place]   Your physician recommends that you continue on your current medications as directed. Please refer to the Current Medication list given to you today.  Thank you for choosing [facility]!!

## 2018-01-12 DIAGNOSIS — Z0100 Encounter for examination of eyes and vision without abnormal findings: Secondary | ICD-10-CM | POA: Diagnosis not present

## 2018-01-17 DIAGNOSIS — N6310 Unspecified lump in the right breast, unspecified quadrant: Secondary | ICD-10-CM | POA: Diagnosis not present

## 2018-01-18 ENCOUNTER — Other Ambulatory Visit: Payer: Self-pay | Admitting: Internal Medicine

## 2018-01-18 DIAGNOSIS — N63 Unspecified lump in unspecified breast: Secondary | ICD-10-CM

## 2018-01-20 ENCOUNTER — Ambulatory Visit
Admission: RE | Admit: 2018-01-20 | Discharge: 2018-01-20 | Disposition: A | Discharge status: Home / Self Care | Payer: Medicare HMO | Source: Ambulatory Visit | Attending: Internal Medicine | Admitting: Internal Medicine

## 2018-01-20 ENCOUNTER — Other Ambulatory Visit: Payer: Self-pay | Admitting: Internal Medicine

## 2018-01-20 DIAGNOSIS — N6011 Diffuse cystic mastopathy of right breast: Secondary | ICD-10-CM | POA: Diagnosis not present

## 2018-01-20 DIAGNOSIS — R922 Inconclusive mammogram: Secondary | ICD-10-CM | POA: Diagnosis not present

## 2018-01-20 DIAGNOSIS — N6001 Solitary cyst of right breast: Secondary | ICD-10-CM

## 2018-01-20 DIAGNOSIS — N63 Unspecified lump in unspecified breast: Secondary | ICD-10-CM

## 2018-01-20 HISTORY — DX: Personal history of irradiation: Z92.3

## 2018-01-20 HISTORY — DX: Personal history of antineoplastic chemotherapy: Z92.21

## 2018-01-25 DIAGNOSIS — E119 Type 2 diabetes mellitus without complications: Secondary | ICD-10-CM | POA: Diagnosis not present

## 2018-01-25 LAB — AEROBIC/ANAEROBIC CULTURE (SURGICAL/DEEP WOUND)

## 2018-01-25 LAB — AEROBIC/ANAEROBIC CULTURE W GRAM STAIN (SURGICAL/DEEP WOUND): Culture: NO GROWTH

## 2018-01-27 ENCOUNTER — Ambulatory Visit
Admission: RE | Admit: 2018-01-27 | Discharge: 2018-01-27 | Disposition: A | Discharge status: Home / Self Care | Payer: Medicare HMO | Source: Ambulatory Visit | Attending: Internal Medicine | Admitting: Internal Medicine

## 2018-01-27 DIAGNOSIS — N6001 Solitary cyst of right breast: Secondary | ICD-10-CM

## 2018-01-30 DIAGNOSIS — G40802 Other epilepsy, not intractable, without status epilepticus: Secondary | ICD-10-CM | POA: Diagnosis not present

## 2018-02-01 DIAGNOSIS — N6001 Solitary cyst of right breast: Secondary | ICD-10-CM | POA: Diagnosis not present

## 2018-02-03 ENCOUNTER — Other Ambulatory Visit: Payer: Medicare HMO

## 2018-02-06 DIAGNOSIS — G8929 Other chronic pain: Secondary | ICD-10-CM | POA: Diagnosis not present

## 2018-02-06 DIAGNOSIS — G40802 Other epilepsy, not intractable, without status epilepticus: Secondary | ICD-10-CM | POA: Diagnosis not present

## 2018-02-06 DIAGNOSIS — M539 Dorsopathy, unspecified: Secondary | ICD-10-CM | POA: Diagnosis not present

## 2018-02-06 DIAGNOSIS — H3530 Unspecified macular degeneration: Secondary | ICD-10-CM | POA: Diagnosis not present

## 2018-02-10 ENCOUNTER — Ambulatory Visit: Payer: Self-pay | Admitting: General Surgery

## 2018-02-16 DIAGNOSIS — F331 Major depressive disorder, recurrent, moderate: Secondary | ICD-10-CM | POA: Diagnosis not present

## 2018-02-16 DIAGNOSIS — I10 Essential (primary) hypertension: Secondary | ICD-10-CM | POA: Diagnosis not present

## 2018-02-16 DIAGNOSIS — M539 Dorsopathy, unspecified: Secondary | ICD-10-CM | POA: Diagnosis not present

## 2018-02-16 DIAGNOSIS — R21 Rash and other nonspecific skin eruption: Secondary | ICD-10-CM | POA: Diagnosis not present

## 2018-02-17 NOTE — Progress Notes (Signed)
Took the chart to Dr. Smith Robert to have him review. After looking over pt medical hx, he agreed that the pt will need cardiac clearance as well as a follow up note from neurology on the the pts recent hospital admission to r/o stroke. Will call Dr. Ethlyn Gallery office and inform his staff.

## 2018-02-17 NOTE — Progress Notes (Signed)
I have spoke with the staff at Dr. Ethlyn Gallery office about this pt and the need for cardiac clearance and updated note from neurology regarding her recent admission to [facility] to r/o CVA.

## 2018-02-21 ENCOUNTER — Other Ambulatory Visit: Payer: Self-pay

## 2018-02-21 ENCOUNTER — Encounter (HOSPITAL_BASED_OUTPATIENT_CLINIC_OR_DEPARTMENT_OTHER): Payer: Self-pay | Admitting: *Deleted

## 2018-02-21 NOTE — Progress Notes (Signed)
Chart reviewed with Dr Lissa Hoard, due to pt having recent stroke 12-12-17 and received TPA, also cardiac hx with stents, patient should be done at [place]. Caryl Pina at Dr Ethlyn Gallery office notified.

## 2018-02-23 ENCOUNTER — Encounter (HOSPITAL_COMMUNITY)
Admission: RE | Admit: 2018-02-23 | Discharge: 2018-02-23 | Disposition: A | Discharge status: Home / Self Care | Payer: Medicare HMO | Source: Ambulatory Visit | Attending: General Surgery | Admitting: General Surgery

## 2018-02-23 DIAGNOSIS — Z955 Presence of coronary angioplasty implant and graft: Secondary | ICD-10-CM | POA: Diagnosis not present

## 2018-02-23 DIAGNOSIS — F329 Major depressive disorder, single episode, unspecified: Secondary | ICD-10-CM | POA: Diagnosis not present

## 2018-02-23 DIAGNOSIS — Z952 Presence of prosthetic heart valve: Secondary | ICD-10-CM | POA: Diagnosis not present

## 2018-02-23 DIAGNOSIS — Z880 Allergy status to penicillin: Secondary | ICD-10-CM | POA: Diagnosis not present

## 2018-02-23 DIAGNOSIS — K219 Gastro-esophageal reflux disease without esophagitis: Secondary | ICD-10-CM | POA: Diagnosis not present

## 2018-02-23 DIAGNOSIS — Z7902 Long term (current) use of antithrombotics/antiplatelets: Secondary | ICD-10-CM | POA: Diagnosis not present

## 2018-02-23 DIAGNOSIS — I10 Essential (primary) hypertension: Secondary | ICD-10-CM | POA: Diagnosis not present

## 2018-02-23 DIAGNOSIS — I252 Old myocardial infarction: Secondary | ICD-10-CM | POA: Diagnosis not present

## 2018-02-23 DIAGNOSIS — G40909 Epilepsy, unspecified, not intractable, without status epilepticus: Secondary | ICD-10-CM | POA: Diagnosis not present

## 2018-02-23 DIAGNOSIS — Z01812 Encounter for preprocedural laboratory examination: Secondary | ICD-10-CM

## 2018-02-23 DIAGNOSIS — Z853 Personal history of malignant neoplasm of breast: Secondary | ICD-10-CM | POA: Diagnosis not present

## 2018-02-23 DIAGNOSIS — Z8711 Personal history of peptic ulcer disease: Secondary | ICD-10-CM | POA: Diagnosis not present

## 2018-02-23 DIAGNOSIS — N6011 Diffuse cystic mastopathy of right breast: Secondary | ICD-10-CM | POA: Diagnosis not present

## 2018-02-23 DIAGNOSIS — E7800 Pure hypercholesterolemia, unspecified: Secondary | ICD-10-CM | POA: Diagnosis not present

## 2018-02-23 DIAGNOSIS — Z8542 Personal history of malignant neoplasm of other parts of uterus: Secondary | ICD-10-CM | POA: Diagnosis not present

## 2018-02-23 DIAGNOSIS — N6001 Solitary cyst of right breast: Secondary | ICD-10-CM | POA: Diagnosis not present

## 2018-02-23 DIAGNOSIS — Z79899 Other long term (current) drug therapy: Secondary | ICD-10-CM | POA: Diagnosis not present

## 2018-02-23 DIAGNOSIS — F419 Anxiety disorder, unspecified: Secondary | ICD-10-CM | POA: Diagnosis not present

## 2018-02-23 DIAGNOSIS — Z8673 Personal history of transient ischemic attack (TIA), and cerebral infarction without residual deficits: Secondary | ICD-10-CM | POA: Diagnosis not present

## 2018-02-23 DIAGNOSIS — N644 Mastodynia: Secondary | ICD-10-CM | POA: Diagnosis not present

## 2018-02-23 DIAGNOSIS — Z87891 Personal history of nicotine dependence: Secondary | ICD-10-CM | POA: Diagnosis not present

## 2018-02-23 DIAGNOSIS — R7303 Prediabetes: Secondary | ICD-10-CM | POA: Insufficient documentation

## 2018-02-23 DIAGNOSIS — I2510 Atherosclerotic heart disease of native coronary artery without angina pectoris: Secondary | ICD-10-CM | POA: Diagnosis not present

## 2018-02-23 DIAGNOSIS — E119 Type 2 diabetes mellitus without complications: Secondary | ICD-10-CM | POA: Diagnosis not present

## 2018-02-23 LAB — CBC
HEMATOCRIT: 43.3 % (ref 36.0–46.0)
Hemoglobin: 13.4 g/dL (ref 12.0–15.0)
MCH: 26.8 pg (ref 26.0–34.0)
MCHC: 30.9 g/dL (ref 30.0–36.0)
MCV: 86.6 fL (ref 80.0–100.0)
Platelets: 240 10*3/uL (ref 150–400)
RBC: 5 MIL/uL (ref 3.87–5.11)
RDW: 13.9 % (ref 11.5–15.5)
WBC: 7.1 10*3/uL (ref 4.0–10.5)
nRBC: 0 % (ref 0.0–0.2)

## 2018-02-23 LAB — BASIC METABOLIC PANEL
Anion gap: 7 (ref 5–15)
BUN: 5 mg/dL — AB (ref 6–20)
CALCIUM: 9.5 mg/dL (ref 8.9–10.3)
CHLORIDE: 109 mmol/L (ref 98–111)
CO2: 24 mmol/L (ref 22–32)
CREATININE: 1.08 mg/dL — AB (ref 0.44–1.00)
GFR, EST NON AFRICAN AMERICAN: 58 mL/min — AB (ref >60)
Glucose, Bld: 120 mg/dL — ABNORMAL HIGH (ref 70–99)
Potassium: 3.6 mmol/L (ref 3.5–5.1)
SODIUM: 140 mmol/L (ref 135–145)

## 2018-02-23 LAB — GLUCOSE, CAPILLARY: Glucose-Capillary: 93 mg/dL (ref 70–99)

## 2018-02-23 MED ORDER — MIDAZOLAM HCL 2 MG/2ML IJ SOLN: 1.0000 mg | INTRAMUSCULAR | Status: DC | PRN

## 2018-02-23 MED ORDER — FENTANYL CITRATE (PF) 100 MCG/2ML IJ SOLN: 50.0000 ug | INTRAMUSCULAR | Status: DC | PRN

## 2018-02-23 MED ORDER — LACTATED RINGERS IV SOLN: INTRAVENOUS | Status: DC

## 2018-02-23 MED ORDER — SCOPOLAMINE 1 MG/3DAYS TD PT72: 1.0000 | MEDICATED_PATCH | Freq: Once | TRANSDERMAL | Status: DC | PRN

## 2018-02-23 NOTE — Progress Notes (Signed)
Anesthesia Chart Review: SAME DAY WORK-UP (but able to come in to PAT for labs 02/23/18)   Case:  [id number] Date/Time:  02/24/18 1530   Procedure:  RIGHT BREAST LUMPECTOMY ERAS PATHWAY (Right )   Anesthesia type:  General   Pre-op diagnosis:  Right breast cyst   Location:  [facility] OR ROOM 04 / [place] OR   Surgeon:  Jovita Kussmaul, MD      DISCUSSION: Patient is a 52 year old female scheduled for the above procedure. Case was initially scheduled at [facility], but moved to [place] due to cardiac history and recent TIA (s/p tPA). Patient has cardiac clearance. Per Janelle Floor, RN, [facility] staff reported they received neurology clearance to hold Plavix for 5 days prior to surgery. [facility] said they would fax note to PAT.   History includes former smoker (quit 04/03/12), CAD (s/p DES RCA 04/03/12, DES LAD 04/25/12; 05/10/16: patent LAD stent with D2 jailed by LAD stent and CTO RCA with L->R collaterals), CVA 12/2017, seizures, pre-diabetes, HTN, HLD, GERD, anemia, uterine "cancer" (s/p hysterectomy 04/25/01; op note states for fibroids and severe dysmenorrhea, but pathology report not available), left breast cancer (s/p left lumpectomy and radiation, details unavailable), remote history of suicide attempts.   - Admission at [facility] 12/11/17-12/13/17 for TIA. Presented to [facility] with right hemiparesis, dysarthria, and right visual field cut. Head CT showed no acute abnormality. Tele-neurologist recommended tPA which was initiated, and patient transferred to [facility] for on-going care. CTA showed no significant carotid, vertebral, or intracranial stenoses. No shunt on echo. No arrhythmias on EKG. EEG negative for seizures. She was changed from ASA 81 mg to Plavix 75 mg daily. Reported headaches as residual.   Based on available information, I would anticipate that she can proceed as planned.    VS: Ht 5\' 4"  (1.626 m)   Wt 68 kg   BMI 25.75 kg/m  BP 167/89, HR 61, T 36.7C, RR 20, O2 sat 100%. CBG  93.   PROVIDERS: [name], MD is PCP - [name], MD is cardiologist. Last visit 01/05/18 with continued medical therapy recommended. On 02/01/18, he wrote that patient "may proceed with surgery from a cardiac perspective." (His notation is scanned under the Media tab.) Terrill Mohr, MD is neurologist ([place]).   LABS: Preoperative CBC and BMET results acceptable for OR. Lab Results  Component Value Date   WBC 7.1 02/23/2018   HGB 13.4 02/23/2018   HCT 43.3 02/23/2018   PLT 240 02/23/2018   GLUCOSE 120 (H) 02/23/2018   NA 140 02/23/2018   K 3.6 02/23/2018   CL 109 02/23/2018   CREATININE 1.08 (H) 02/23/2018   BUN 5 (L) 02/23/2018   CO2 24 02/23/2018   HGBA1C 5.2 07/13/2017     IMAGES: CT Head (post tPA) 12/12/17 ([place]): FINDINGS: No evidence of hydrocephalus. No intracranial mass, mass effect or midline shift. No acute infarction evident. No intracranial hemorrhage. Paranasal sinuses: Clear. Mastoid air cells: Clear. Calvarium: Intact. IMPRESSION: No acute intracranial abnormality.  MRI head 12/12/17 ([place]): IMPRESSION:  Normal brain MR.  EEG (long-term) 12/12/17 ([place]): Interpretation:This is normal long term EEG. Clinical Correlation: While a normal EEG does not rule out epilepsy, there  was no evidence of an underlying seizure disorder on this study.   EKG:  - EKG 12/14/17 ([place]): Result Narrative ONLY:  Ventricular Rate 74 Atrial Rate 74 P-R Interval 163 QRS Duration 92 Q-T Interval  421 QTC Calculation([place]) 468 Calculated P Axis 79 Calculated R Axis 67 Calculated T Axis 63 Diagnosis Normal sinus rhythm with frequent premature ventricular complexes with occasional premature atrial complexes Abnormal ECG No previous ECGs available  -EKG 07/13/17: SR, probable LVH.    CV: Echo with bubble study 12/12/17 ([place]): Interpretation  Summary The left ventricle is normal in size, wall thickness and wall motion with ejection fraction of 60-65%. There is mild to moderate [1-2+] aortic regurgitation present. Injection of contrast documented no interatrial shunt.  Cardiac cath 05/10/16: Left Anterior Descending  Mid LAD to Dist LAD lesion 10% stenosed  The lesion was previously treated.  Dist LAD lesion 50% stenosed  Dist LAD lesion.  First Diagonal Branch  Vessel is small in size.  Second Diagonal [employer] 2nd Diag lesion 75% stenosed  The lesion is eccentric.  Ramus Intermedius  Vessel is small.  Left Circumflex  Prox Cx to Dist Cx lesion 30% stenosed  Prox Cx to Dist Cx lesion.  First Obtuse Marginal Branch  Vessel is small in size.  1st Mrg lesion 80% stenosed  1st Mrg lesion.  Second Obtuse Marginal Branch  2nd Mrg lesion 20% stenosed  2nd Mrg lesion.  Right Coronary Artery  Prox RCA to Mid RCA lesion 100% stenosed  The lesion was previously treated.  Right Posterior Descending Artery  Collaterals  RPDA filled by collaterals from Dist LAD.  CONCLUSIONS:  Widely patent LAD stent. Ostial 75% second diagonal jailed by LAD stent.  Totally occluded right coronary within the previously placed stent. The right fills by left to right collaterals.  Progression of first obtuse marginal from 30% to 75-80%. The vessel caliber is in the 1.5-2 mm range and is probably best treated with medical therapy.  Widely patent left main  Normal left ventricular function.EF estimated to be 60%. EDP is 14. RECOMMENDATIONS:  Suspect angina is coming either from the first obtuse marginal, the second diagonal which is jailed, or from the collateralized right coronary.  Intensify medical therapy. ? Add Ranexa.  Intensify risk factor modification including LDL cholesterol less than 70.  Consider CTO PCI of the RCA if symptoms become refractory.  Consider PCI on the small to moderate size first obtuse marginal, however  this vessel would possibly be oversized by our smallest stent.   Past Medical History:  Diagnosis Date  . Anemia   . Anxiety   . Bilateral breast cysts 12/30/2015  . Breast cancer ([place])    Left Breast Cancer  . BV (bacterial vaginosis) 09/09/2015  . Coronary atherosclerosis of native coronary artery    a. s/p DES to RCA in 2013 b. DES to LAD in 2014 c. cath in 04/2016 showing patent LAD stent with D2 jailed by LAD stent and CTO of RCA with left to right collaterals present  . Cyst of pharynx or nasopharynx    [name]  . Depression   . Essential hypertension   . Falls    x 3-4 in past year  . GERD (gastroesophageal reflux disease)   . History of breast cancer 09/09/2015  . History of hematuria   . Mixed hyperlipidemia   . Personal history of chemotherapy    Left Breast Cancer  . Personal history of radiation therapy    Left Breast Cancer  . Pre-diabetes   . PUD (peptic ulcer disease)   . Seizures ([place])   . Shingles 09/09/2015  . Stroke ([place])    12-2017 on Plavix, only deficit  is headaches  . Suicide attempt [facility])    3 attempts in remote past  . Uterine cancer [facility])     Past Surgical History:  Procedure Laterality Date  . ABDOMINAL HYSTERECTOMY    . BLADDER SURGERY    . BREAST LUMPECTOMY Left   . CARDIAC CATHETERIZATION N/A 05/10/2016   Procedure: Left Heart Cath and Coronary Angiography;  Surgeon: Belva Crome, MD;  Location: [place] CV LAB;  Service: Cardiovascular;  Laterality: N/A;  . COLONOSCOPY  May 2010   Fleishman: normal rectum, internal hemorrhoids, , benign colonic polyp  . COLONOSCOPY N/A 01/05/2017   Procedure: COLONOSCOPY;  Surgeon: Rogene Houston, MD;  [facility];  Service: Endoscopy;  Laterality: N/A;  1:00  . ESOPHAGOGASTRODUODENOSCOPY     2001 Dr. Amedeo Plenty: distal esophagitis, small hiatal hernia,. Dr. Tamala Julian 2006? no records available currently, pt also reports  EGD a few years ago with Dr. Gala Romney, do not have these  reports anywhere in medical records  . ESOPHAGOGASTRODUODENOSCOPY  06/02/2011   ZOX:WRUEAV pill impaction as described above s/p dilation of a probable cervical esophageal web/bx abnormal esophageal and gastric mucosa. + H.pylori gastritis   . EYE SURGERY     Removed glass  . HAND SURGERY Right   . Left breast lumpectomy     Benign  . LEFT HEART CATHETERIZATION WITH CORONARY ANGIOGRAM N/A 07/03/2014   Procedure: LEFT HEART CATHETERIZATION WITH CORONARY ANGIOGRAM;  Surgeon: Belva Crome, MD;  Location: [facility] CATH LAB;  Service: Cardiovascular;  Laterality: N/A;  . POLYPECTOMY  01/05/2017   Procedure: POLYPECTOMY;  Surgeon: Rogene Houston, MD;  [facility];  Service: Endoscopy;;  colon  . SHOULDER SURGERY Left     MEDICATIONS: . fentaNYL (SUBLIMAZE) injection 50-100 mcg  . lactated ringers infusion  . midazolam (VERSED) injection 1-2 mg  . scopolamine (TRANSDERM-SCOP) 1 MG/3DAYS 1.5 mg   . ALPRAZolam (XANAX) 0.5 MG tablet  . amLODipine (NORVASC) 5 MG tablet  . atorvastatin (LIPITOR) 40 MG tablet  . EPINEPHrine 0.3 mg/0.3 mL IJ SOAJ injection  . escitalopram (LEXAPRO) 20 MG tablet  . isosorbide mononitrate (IMDUR) 30 MG 24 hr tablet  . losartan (COZAAR) 50 MG tablet  . metoprolol tartrate (LOPRESSOR) 50 MG tablet  . mometasone (ASMANEX, 30 METERED DOSES,) 220 MCG/INH inhaler  . NITROSTAT 0.4 MG SL tablet  . pantoprazole (PROTONIX) 40 MG tablet  . potassium chloride SA (K-DUR,KLOR-CON) 20 MEQ tablet  . thyroid (ARMOUR) 60 MG tablet  . topiramate (TOPAMAX) 25 MG tablet  . clopidogrel (PLAVIX) 75 MG tablet    George Hugh [facility] Short Stay Center/Anesthesiology Phone [phone] 02/23/2018 4:56 PM

## 2018-02-23 NOTE — Pre-Procedure Instructions (Signed)
Bethany Wang  02/23/2018      Your procedure is scheduled on Friday, November 15.  Report to [facility] at [phone] PM    Call this number if you have problems the morning of surgery: [phone]  This is the number for the Pre- Surgical Desk.    Remember:  Do not eat after midnight.              Drink Pre- surgery Ensure by 12:45 PM  You may drink clear liquids until  12:45.  Clear liquids allowed are:   Water, Juice (non-citric and without pulp), Carbonated beverages, Clear Tea, Black Coffee only, Plain Jell-O only, Gatorade and Plain Popsicles only    Take these medicines the morning of surgery with A SIP OF WATER: Amlodipine,  Escitalopram,  Isorsorbide Metoprolol Pantoprazole Thyroid Topamax Use Asomex inhaler  May take if needed Alpraolam NItroglycerin  Special instructions:  [facility]- Preparing For Surgery  Before surgery, you can play an important role. Because skin is not sterile, your skin needs to be as free of germs as possible. You can reduce the number of germs on your skin by washing with CHG (chlorahexidine gluconate) Soap before surgery.  CHG is an antiseptic cleaner which kills germs and bonds with the skin to continue killing germs even after washing.    Oral Hygiene is also important to reduce your risk of infection.  Remember - BRUSH YOUR TEETH THE MORNING OF SURGERY WITH YOUR REGULAR TOOTHPASTE  Please do not use if you have an allergy to CHG or antibacterial soaps. If your skin becomes reddened/irritated stop using the CHG.  Do not shave (including legs and underarms) for at least 48 hours prior to first CHG shower. It is OK to shave your face.  Please follow these instructions carefully.   1. Shower the NIGHT BEFORE SURGERY and the MORNING OF SURGERY with CHG.   2. If you chose to wash your hair, wash your hair first as usual with your normal shampoo.  3. After you shampoo, wash your face and private area with the soap  you use at home, rinse your hair and body thoroughly to remove the shampoo.  4. Use CHG as you would any other liquid soap. You can apply CHG directly to the skin and wash gently with a scrungie or a clean washcloth.   5. Apply the CHG Soap to your body ONLY FROM THE NECK DOWN.  Do not use on open wounds or open sores. Avoid contact with your eyes, ears, mouth and genitals (private parts). Wash Face and genitals (private parts)  with your normal soap.  6. Wash thoroughly, paying special attention to the area where your surgery will be performed.  7. Thoroughly rinse your body with warm water from the neck down.  8. DO NOT shower/wash with your normal soap after using and rinsing off the CHG Soap.  9. Pat yourself dry with a CLEAN TOWEL.  10. Wear CLEAN PAJAMAS to bed the night before surgery, wear comfortable clothes the morning of surgery  11. Place CLEAN SHEETS on your bed the night of your first shower and DO NOT SLEEP WITH PETS.   Day of Surgery: Shower as above  Do not apply any deodorants/lotions, powders or colognes.  Please wear clean clothes to the hospital/surgery center.   Remember to brush your teeth WITH YOUR REGULAR TOOTHPASTE.  Do not wear jewelry, make-up or nail polish..  Do not shave 48 hours prior  to surgery.  Men may shave face and neck.  Do not bring valuables to the hospital.  [facility] is not responsible for any belongings or valuables.  Contacts, dentures or bridgework may not be worn into surgery.  Leave your suitcase in the car.  After surgery it may be brought to your room.  For patients admitted to the hospital, discharge time will be determined by your treatment team.  Patients discharged the day of surgery will not be allowed to drive home.   Please read over the following fact sheets that you were given.

## 2018-02-23 NOTE — Progress Notes (Signed)
I spoke with Hassan Rowan this am prior to 1200, and asked if their office had received clearance from Neurology to have patient hold Plavix.  Hassan Rowan read a note that said patient could hold Plavix for 5 days. I asked Hassan Rowan to fax it to me.

## 2018-02-23 NOTE — Anesthesia Preprocedure Evaluation (Addendum)
Anesthesia Evaluation  Patient identified by MRN, date of birth, ID band Patient awake    Reviewed: Allergy & Precautions, NPO status , Patient's Chart, lab work & pertinent test results  History of Anesthesia Complications Negative for: history of anesthetic complications  Airway Mallampati: I  TM Distance: >3 FB Neck ROM: Full    Dental  (+) Dental Advisory Given, Teeth Intact   Pulmonary former smoker,    breath sounds clear to auscultation       Cardiovascular Exercise Tolerance: Good hypertension, Pt. on medications and Pt. on home beta blockers (-) angina+ CAD and + Cardiac Stents   Rhythm:Regular Rate:Normal   '19 TTE - EF of 60-65%. There is mild to moderate (1-2+) aortic regurgitation present.  '18 Cath - Widely patent LAD stent. Ostial 75% second diagonal jailed by LAD stent. Totally occluded right coronary within the previously placed stent. The right fills by left to right collaterals. Progression of first obtuse marginal from 30% to 75-80%. The vessel caliber is in the 1.5-2 mm range and is probably best treated with medical therapy. Widely patent left main Normal EF, estimated to be 60%. EDP is 14.    Neuro/Psych  Headaches, Seizures -,  PSYCHIATRIC DISORDERS Anxiety Depression  Hx multiple suicide attempts Per PAT note - "Admission at [facility] 9/1-12/13/17 for TIA. Presented to [facility] with right hemiparesis, dysarthria, and right visual field cut. Head CT showed no acute abnormality. Tele-neurologist recommended tPA which was initiated, and patient transferred to [facility]. CTA showed no significant carotid, vertebral, or intracranial stenoses. No shunt on echo. No arrhythmias on EKG. EEG negative for seizures. She was changed from ASA 81 mg to Plavix 75 mg daily. Reported headaches as residual"  CVA    GI/Hepatic Neg liver ROS, PUD, GERD  Medicated,  Endo/Other   Pre-DM    Renal/GU negative Renal ROS     Musculoskeletal negative musculoskeletal ROS (+)   Abdominal   Peds  Hematology  (+) anemia ,   Anesthesia Other Findings   Reproductive/Obstetrics  B/l breast cysts Uterine cancer s/p hysterectomy                            Anesthesia Physical Anesthesia Plan  ASA: III  Anesthesia Plan: General   Post-op Pain Management:    Induction: Intravenous  PONV Risk Score and Plan: 3 and Treatment may vary due to age or medical condition, Ondansetron and Dexamethasone  Airway Management Planned: LMA  Additional Equipment: None  Intra-op Plan:   Post-operative Plan: Extubation in OR  Informed Consent: I have reviewed the patients History and Physical, chart, labs and discussed the procedure including the risks, benefits and alternatives for the proposed anesthesia with the patient or authorized representative who has indicated his/her understanding and acceptance.   Dental advisory given  Plan Discussed with: CRNA and Anesthesiologist  Anesthesia Plan Comments:       Anesthesia Quick Evaluation

## 2018-02-24 ENCOUNTER — Encounter (HOSPITAL_COMMUNITY): Payer: Self-pay

## 2018-02-24 ENCOUNTER — Encounter (HOSPITAL_COMMUNITY)
Admission: RE | Disposition: A | Discharge status: Home / Self Care | Payer: Self-pay | Source: Ambulatory Visit | Attending: General Surgery

## 2018-02-24 ENCOUNTER — Ambulatory Visit (HOSPITAL_COMMUNITY): Payer: Medicare HMO | Admitting: Anesthesiology

## 2018-02-24 ENCOUNTER — Ambulatory Visit (HOSPITAL_COMMUNITY)
Admission: RE | Admit: 2018-02-24 | Discharge: 2018-02-24 | Disposition: A | Discharge status: Home / Self Care | Payer: Medicare HMO | Source: Ambulatory Visit | Attending: General Surgery | Admitting: General Surgery

## 2018-02-24 DIAGNOSIS — Z880 Allergy status to penicillin: Secondary | ICD-10-CM | POA: Insufficient documentation

## 2018-02-24 DIAGNOSIS — Z952 Presence of prosthetic heart valve: Secondary | ICD-10-CM | POA: Insufficient documentation

## 2018-02-24 DIAGNOSIS — Z853 Personal history of malignant neoplasm of breast: Secondary | ICD-10-CM | POA: Insufficient documentation

## 2018-02-24 DIAGNOSIS — E7800 Pure hypercholesterolemia, unspecified: Secondary | ICD-10-CM | POA: Diagnosis not present

## 2018-02-24 DIAGNOSIS — Z79899 Other long term (current) drug therapy: Secondary | ICD-10-CM | POA: Insufficient documentation

## 2018-02-24 DIAGNOSIS — Z8542 Personal history of malignant neoplasm of other parts of uterus: Secondary | ICD-10-CM | POA: Insufficient documentation

## 2018-02-24 DIAGNOSIS — F419 Anxiety disorder, unspecified: Secondary | ICD-10-CM | POA: Insufficient documentation

## 2018-02-24 DIAGNOSIS — Z8711 Personal history of peptic ulcer disease: Secondary | ICD-10-CM | POA: Insufficient documentation

## 2018-02-24 DIAGNOSIS — N6021 Fibroadenosis of right breast: Secondary | ICD-10-CM | POA: Diagnosis not present

## 2018-02-24 DIAGNOSIS — I252 Old myocardial infarction: Secondary | ICD-10-CM | POA: Diagnosis not present

## 2018-02-24 DIAGNOSIS — N6041 Mammary duct ectasia of right breast: Secondary | ICD-10-CM | POA: Diagnosis not present

## 2018-02-24 DIAGNOSIS — G40909 Epilepsy, unspecified, not intractable, without status epilepticus: Secondary | ICD-10-CM | POA: Diagnosis not present

## 2018-02-24 DIAGNOSIS — K219 Gastro-esophageal reflux disease without esophagitis: Secondary | ICD-10-CM | POA: Insufficient documentation

## 2018-02-24 DIAGNOSIS — N6011 Diffuse cystic mastopathy of right breast: Secondary | ICD-10-CM | POA: Diagnosis not present

## 2018-02-24 DIAGNOSIS — N6001 Solitary cyst of right breast: Secondary | ICD-10-CM | POA: Diagnosis not present

## 2018-02-24 DIAGNOSIS — E119 Type 2 diabetes mellitus without complications: Secondary | ICD-10-CM | POA: Insufficient documentation

## 2018-02-24 DIAGNOSIS — I10 Essential (primary) hypertension: Secondary | ICD-10-CM | POA: Diagnosis not present

## 2018-02-24 DIAGNOSIS — I2510 Atherosclerotic heart disease of native coronary artery without angina pectoris: Secondary | ICD-10-CM | POA: Diagnosis not present

## 2018-02-24 DIAGNOSIS — Z955 Presence of coronary angioplasty implant and graft: Secondary | ICD-10-CM | POA: Insufficient documentation

## 2018-02-24 DIAGNOSIS — Z8673 Personal history of transient ischemic attack (TIA), and cerebral infarction without residual deficits: Secondary | ICD-10-CM | POA: Insufficient documentation

## 2018-02-24 DIAGNOSIS — N644 Mastodynia: Secondary | ICD-10-CM | POA: Insufficient documentation

## 2018-02-24 DIAGNOSIS — Z7902 Long term (current) use of antithrombotics/antiplatelets: Secondary | ICD-10-CM | POA: Insufficient documentation

## 2018-02-24 DIAGNOSIS — N6081 Other benign mammary dysplasias of right breast: Secondary | ICD-10-CM | POA: Diagnosis not present

## 2018-02-24 DIAGNOSIS — Z87891 Personal history of nicotine dependence: Secondary | ICD-10-CM | POA: Insufficient documentation

## 2018-02-24 DIAGNOSIS — F329 Major depressive disorder, single episode, unspecified: Secondary | ICD-10-CM | POA: Insufficient documentation

## 2018-02-24 DIAGNOSIS — R920 Mammographic microcalcification found on diagnostic imaging of breast: Secondary | ICD-10-CM | POA: Diagnosis not present

## 2018-02-24 HISTORY — DX: Cerebral infarction, unspecified: I63.9

## 2018-02-24 HISTORY — DX: Anxiety disorder, unspecified: F41.9

## 2018-02-24 HISTORY — DX: Prediabetes: R73.03

## 2018-02-24 HISTORY — PX: BREAST LUMPECTOMY: SHX2

## 2018-02-24 HISTORY — DX: Depression, unspecified: F32.A

## 2018-02-24 HISTORY — DX: Major depressive disorder, single episode, unspecified: F32.9

## 2018-02-24 LAB — GLUCOSE, CAPILLARY
Glucose-Capillary: 195 mg/dL — ABNORMAL HIGH (ref 70–99)
Glucose-Capillary: 104 mg/dL — ABNORMAL HIGH (ref 70–99)

## 2018-02-24 SURGERY — BREAST LUMPECTOMY
Anesthesia: General | Laterality: Right

## 2018-02-24 MED ORDER — CELECOXIB 200 MG PO CAPS
ORAL_CAPSULE | ORAL | Status: AC
  Administered 2018-02-24: 200 mg via ORAL
  Filled 2018-02-24: qty 1

## 2018-02-24 MED ORDER — PHENYLEPHRINE 40 MCG/ML (10ML) SYRINGE FOR IV PUSH (FOR BLOOD PRESSURE SUPPORT)
PREFILLED_SYRINGE | INTRAVENOUS | Status: AC
  Filled 2018-02-24: qty 10

## 2018-02-24 MED ORDER — CHLORHEXIDINE GLUCONATE CLOTH 2 % EX PADS: 6.0000 | MEDICATED_PAD | Freq: Once | CUTANEOUS | Status: DC

## 2018-02-24 MED ORDER — OXYCODONE HCL 5 MG/5ML PO SOLN: 5.0000 mg | Freq: Once | ORAL | Status: AC | PRN

## 2018-02-24 MED ORDER — GLYCOPYRROLATE PF 0.2 MG/ML IJ SOSY
PREFILLED_SYRINGE | INTRAMUSCULAR | Status: AC
  Filled 2018-02-24: qty 1

## 2018-02-24 MED ORDER — ONDANSETRON HCL 4 MG/2ML IJ SOLN
INTRAMUSCULAR | Status: AC
  Filled 2018-02-24: qty 2

## 2018-02-24 MED ORDER — MIDAZOLAM HCL 2 MG/2ML IJ SOLN
INTRAMUSCULAR | Status: DC | PRN
  Administered 2018-02-24: 2 mg via INTRAVENOUS

## 2018-02-24 MED ORDER — VANCOMYCIN HCL IN DEXTROSE 1-5 GM/200ML-% IV SOLN
INTRAVENOUS | Status: AC
  Filled 2018-02-24: qty 200

## 2018-02-24 MED ORDER — LACTATED RINGERS IV SOLN
INTRAVENOUS | Status: DC
  Administered 2018-02-24: 14:00:00 via INTRAVENOUS

## 2018-02-24 MED ORDER — DEXAMETHASONE SODIUM PHOSPHATE 10 MG/ML IJ SOLN
INTRAMUSCULAR | Status: AC
  Filled 2018-02-24: qty 1

## 2018-02-24 MED ORDER — BUPIVACAINE-EPINEPHRINE (PF) 0.25% -1:200000 IJ SOLN
INTRAMUSCULAR | Status: AC
  Filled 2018-02-24: qty 30

## 2018-02-24 MED ORDER — ACETAMINOPHEN 500 MG PO TABS
ORAL_TABLET | ORAL | Status: AC
  Administered 2018-02-24: 1000 mg via ORAL
  Filled 2018-02-24: qty 2

## 2018-02-24 MED ORDER — ACETAMINOPHEN 500 MG PO TABS
1000.0000 mg | ORAL_TABLET | ORAL | Status: AC
  Administered 2018-02-24: 1000 mg via ORAL

## 2018-02-24 MED ORDER — BUPIVACAINE HCL (PF) 0.25 % IJ SOLN
INTRAMUSCULAR | Status: DC | PRN
  Administered 2018-02-24: 30 mL

## 2018-02-24 MED ORDER — CELECOXIB 200 MG PO CAPS
200.0000 mg | ORAL_CAPSULE | ORAL | Status: AC
  Administered 2018-02-24: 200 mg via ORAL

## 2018-02-24 MED ORDER — ROCURONIUM BROMIDE 50 MG/5ML IV SOSY
PREFILLED_SYRINGE | INTRAVENOUS | Status: AC
  Filled 2018-02-24: qty 5

## 2018-02-24 MED ORDER — HYDROCODONE-ACETAMINOPHEN 5-325 MG PO TABS: 1.0000 | ORAL_TABLET | Freq: Four times a day (QID) | ORAL | 0 refills | Status: DC | PRN

## 2018-02-24 MED ORDER — MIDAZOLAM HCL 2 MG/2ML IJ SOLN
INTRAMUSCULAR | Status: AC
  Filled 2018-02-24: qty 2

## 2018-02-24 MED ORDER — FENTANYL CITRATE (PF) 250 MCG/5ML IJ SOLN
INTRAMUSCULAR | Status: AC
  Filled 2018-02-24: qty 5

## 2018-02-24 MED ORDER — SODIUM CHLORIDE 0.9 % IV SOLN
INTRAVENOUS | Status: DC | PRN
  Administered 2018-02-24: 40 ug/min via INTRAVENOUS

## 2018-02-24 MED ORDER — OXYCODONE HCL 5 MG PO TABS
ORAL_TABLET | ORAL | Status: AC
  Filled 2018-02-24: qty 1

## 2018-02-24 MED ORDER — VANCOMYCIN HCL IN DEXTROSE 1-5 GM/200ML-% IV SOLN
1000.0000 mg | INTRAVENOUS | Status: AC
  Administered 2018-02-24: 1000 mg via INTRAVENOUS

## 2018-02-24 MED ORDER — PROPOFOL 10 MG/ML IV BOLUS
INTRAVENOUS | Status: AC
  Filled 2018-02-24: qty 20

## 2018-02-24 MED ORDER — DEXAMETHASONE SODIUM PHOSPHATE 10 MG/ML IJ SOLN
INTRAMUSCULAR | Status: DC | PRN
  Administered 2018-02-24: 10 mg via INTRAVENOUS

## 2018-02-24 MED ORDER — LIDOCAINE 2% (20 MG/ML) 5 ML SYRINGE
INTRAMUSCULAR | Status: AC
  Filled 2018-02-24: qty 5

## 2018-02-24 MED ORDER — ONDANSETRON HCL 4 MG/2ML IJ SOLN
INTRAMUSCULAR | Status: DC | PRN
  Administered 2018-02-24: 4 mg via INTRAVENOUS

## 2018-02-24 MED ORDER — OXYCODONE HCL 5 MG PO TABS
5.0000 mg | ORAL_TABLET | Freq: Once | ORAL | Status: AC | PRN
  Administered 2018-02-24: 5 mg via ORAL

## 2018-02-24 MED ORDER — GABAPENTIN 300 MG PO CAPS
300.0000 mg | ORAL_CAPSULE | ORAL | Status: AC
  Administered 2018-02-24: 300 mg via ORAL

## 2018-02-24 MED ORDER — PROPOFOL 10 MG/ML IV BOLUS
INTRAVENOUS | Status: DC | PRN
  Administered 2018-02-24: 150 mg via INTRAVENOUS

## 2018-02-24 MED ORDER — LIDOCAINE 2% (20 MG/ML) 5 ML SYRINGE
INTRAMUSCULAR | Status: DC | PRN
  Administered 2018-02-24: 100 mg via INTRAVENOUS
  Administered 2018-02-24: 60 mg via INTRAVENOUS

## 2018-02-24 MED ORDER — GABAPENTIN 300 MG PO CAPS
ORAL_CAPSULE | ORAL | Status: AC
  Administered 2018-02-24: 300 mg via ORAL
  Filled 2018-02-24: qty 1

## 2018-02-24 MED ORDER — FENTANYL CITRATE (PF) 100 MCG/2ML IJ SOLN: 25.0000 ug | INTRAMUSCULAR | Status: DC | PRN

## 2018-02-24 MED ORDER — ONDANSETRON HCL 4 MG/2ML IJ SOLN: 4.0000 mg | Freq: Once | INTRAMUSCULAR | Status: DC | PRN

## 2018-02-24 MED ORDER — 0.9 % SODIUM CHLORIDE (POUR BTL) OPTIME
TOPICAL | Status: DC | PRN
  Administered 2018-02-24: 1000 mL

## 2018-02-24 MED ORDER — EPHEDRINE 5 MG/ML INJ
INTRAVENOUS | Status: AC
  Filled 2018-02-24: qty 10

## 2018-02-24 MED ORDER — SUCCINYLCHOLINE CHLORIDE 200 MG/10ML IV SOSY
PREFILLED_SYRINGE | INTRAVENOUS | Status: AC
  Filled 2018-02-24: qty 10

## 2018-02-24 MED ORDER — FENTANYL CITRATE (PF) 250 MCG/5ML IJ SOLN
INTRAMUSCULAR | Status: DC | PRN
  Administered 2018-02-24 (×2): 50 ug via INTRAVENOUS

## 2018-02-24 SURGICAL SUPPLY — 41 items
ADH SKN CLS APL DERMABOND .7 (GAUZE/BANDAGES/DRESSINGS) ×1
APPLIER CLIP 9.375 MED OPEN (MISCELLANEOUS) ×3
APR CLP MED 9.3 20 MLT OPN (MISCELLANEOUS) ×1
BINDER BREAST LRG (GAUZE/BANDAGES/DRESSINGS) ×2 IMPLANT
BINDER BREAST XLRG (GAUZE/BANDAGES/DRESSINGS) IMPLANT
BLADE SURG 10 STRL SS (BLADE) ×3 IMPLANT
BLADE SURG 15 STRL LF DISP TIS (BLADE) ×1 IMPLANT
BLADE SURG 15 STRL SS (BLADE) ×3
CHLORAPREP W/TINT 26ML (MISCELLANEOUS) ×3 IMPLANT
CLIP APPLIE 9.375 MED OPEN (MISCELLANEOUS) ×1 IMPLANT
COVER SURGICAL LIGHT HANDLE (MISCELLANEOUS) ×3 IMPLANT
COVER WAND RF STERILE (DRAPES) ×3 IMPLANT
DERMABOND ADVANCED (GAUZE/BANDAGES/DRESSINGS) ×2
DERMABOND ADVANCED .7 DNX12 (GAUZE/BANDAGES/DRESSINGS) ×1 IMPLANT
DEVICE DUBIN SPECIMEN MAMMOGRA (MISCELLANEOUS) IMPLANT
DRAPE CHEST BREAST 15X10 FENES (DRAPES) ×3 IMPLANT
DRAPE UTILITY XL STRL (DRAPES) ×3 IMPLANT
ELECT CAUTERY BLADE 6.4 (BLADE) ×3 IMPLANT
ELECT COATED BLADE 2.86 ST (ELECTRODE) ×3 IMPLANT
ELECT REM PT RETURN 9FT ADLT (ELECTROSURGICAL) ×3
ELECTRODE REM PT RTRN 9FT ADLT (ELECTROSURGICAL) ×1 IMPLANT
GAUZE 4X4 16PLY RFD (DISPOSABLE) ×3 IMPLANT
GLOVE BIO SURGEON STRL SZ7.5 (GLOVE) ×3 IMPLANT
GOWN STRL REUS W/ TWL LRG LVL3 (GOWN DISPOSABLE) ×2 IMPLANT
GOWN STRL REUS W/TWL LRG LVL3 (GOWN DISPOSABLE) ×6
KIT BASIN OR (CUSTOM PROCEDURE TRAY) ×3 IMPLANT
KIT MARKER MARGIN INK (KITS) ×2 IMPLANT
KIT TURNOVER KIT B (KITS) ×3 IMPLANT
LIGHT WAVEGUIDE WIDE FLAT (MISCELLANEOUS) IMPLANT
NDL HYPO 25GX1X1/2 BEV (NEEDLE) ×1 IMPLANT
NEEDLE HYPO 25GX1X1/2 BEV (NEEDLE) ×3 IMPLANT
NS IRRIG 1000ML POUR BTL (IV SOLUTION) ×3 IMPLANT
PACK SURGICAL SETUP 50X90 (CUSTOM PROCEDURE TRAY) ×3 IMPLANT
PAD ARMBOARD 7.5X6 YLW CONV (MISCELLANEOUS) ×3 IMPLANT
PENCIL BUTTON HOLSTER BLD 10FT (ELECTRODE) ×3 IMPLANT
SUT MNCRL AB 4-0 PS2 18 (SUTURE) ×3 IMPLANT
SUT VIC AB 3-0 SH 18 (SUTURE) ×3 IMPLANT
SYR BULB 3OZ (MISCELLANEOUS) ×3 IMPLANT
SYR CONTROL 10ML LL (SYRINGE) ×3 IMPLANT
TOWEL OR 17X24 6PK STRL BLUE (TOWEL DISPOSABLE) ×3 IMPLANT
TOWEL OR 17X26 10 PK STRL BLUE (TOWEL DISPOSABLE) ×3 IMPLANT

## 2018-02-24 NOTE — Interval H&P Note (Signed)
History and Physical Interval Note:  02/24/2018 2:27 PM  Bethany Wang  has presented today for surgery, with the diagnosis of Right breast cyst  The various methods of treatment have been discussed with the patient and family. After consideration of risks, benefits and other options for treatment, the patient has consented to  Procedure(s): RIGHT BREAST LUMPECTOMY ERAS PATHWAY (Right) as a surgical intervention .  The patient's history has been reviewed, patient examined, no change in status, stable for surgery.  I have reviewed the patient's chart and labs.  Questions were answered to the patient's satisfaction.     [name]

## 2018-02-24 NOTE — Anesthesia Procedure Notes (Signed)
Procedure Name: LMA Insertion Date/Time: 02/24/2018 3:55 PM Performed by: Marsa Aris, CRNA Pre-anesthesia Checklist: Patient identified, Emergency Drugs available, Suction available and Patient being monitored Patient Re-evaluated:Patient Re-evaluated prior to induction Oxygen Delivery Method: Circle System Utilized Preoxygenation: Pre-oxygenation with 100% oxygen Induction Type: IV induction Ventilation: Mask ventilation without difficulty LMA: LMA inserted LMA Size: 4.0 Number of attempts: 1 Airway Equipment and Method: Bite block Placement Confirmation: positive ETCO2 Tube secured with: Tape Dental Injury: Teeth and Oropharynx as per pre-operative assessment

## 2018-02-24 NOTE — Op Note (Signed)
02/24/2018  4:47 PM  PATIENT:  Bethany Wang  52 y.o. female  PRE-OPERATIVE DIAGNOSIS:  Right breast cyst  POST-OPERATIVE DIAGNOSIS:  Right breast cyst  PROCEDURE:  Procedure(s): RIGHT BREAST LUMPECTOMY ERAS PATHWAY (Right)  SURGEON:  Surgeon(s) and Role:    Jovita Kussmaul, MD - Primary  PHYSICIAN ASSISTANT:   ASSISTANTS: none   ANESTHESIA:   local and general  EBL:  20 mL   BLOOD ADMINISTERED:none  DRAINS: none   LOCAL MEDICATIONS USED:  MARCAINE     SPECIMEN:  Source of Specimen:  right breast tissue  DISPOSITION OF SPECIMEN:  PATHOLOGY  COUNTS:  YES  TOURNIQUET:  * No tourniquets in log *  DICTATION: .Dragon Dictation   After informed consent was obtained the patient was brought to the operating room and placed in the supine position on the operating table.  After adequate induction of general anesthesia the patient's right breast was prepped with ChloraPrep, allowed to dry, and draped in usual sterile manner.  An appropriate timeout was performed.  The patient has a recurrent painful cyst in the upper outer quadrant of the right breast that is palpable.  The area around this was infiltrated with quarter percent Marcaine.  A curvilinear incision was made along the upper outer edge of the areola on the right.  The incision was carried through the skin and subcutaneous tissue sharply with the electrocautery.  Dissection was then carried between the breast tissue in the subcutaneous fat in the upper outer quadrant of the right breast.  Once we were beyond the palpable mass we then removed a circular portion of breast tissue sharply around the palpable cyst.  Once the specimen was removed it was oriented with the appropriate paint colors.  The specimen was then sent to pathology for further evaluation.  Hemostasis was achieved using the Bovie electrocautery.  The wound was irrigated with copious amounts of saline and infiltrated with quarter percent Marcaine.  The deep  layer of the wound was then closed with layers of interrupted 3-0 Vicryl stitches.  The skin was then closed with interrupted 4-0 Monocryl subcuticular stitches.  Dermabond dressings were applied.  The patient tolerated the procedure well.  At the end of the case all needle sponge and instrument counts were correct.  The patient was then awakened and taken to recovery in stable condition.  PLAN OF CARE: Discharge to home after PACU  PATIENT DISPOSITION:  PACU - hemodynamically stable.   Delay start of Pharmacological VTE agent (>24hrs) due to surgical blood loss or risk of bleeding: not applicable

## 2018-02-24 NOTE — H&P (Signed)
Bethany Wang  Location: [facility] Patient #: [id number] DOB: 1965/11/03 Married / Language: English / Race: Black or African American Female   History of Present Illness  The patient is a 52 year old female who presents with a breast mass. We are asked to see the patient in consultation by Dr. Ammie Ferrier to evaluate her for a right breast cyst. The patient is a 52 year old black female who has had a recurring cyst in the right breast in the same spot in the upper outer quadrant for the last 4-5 years. The cyst gets painful. She has been treated with aspirations and antibiotics with no improvement. She does have a history of left breast cancer treated with lumpectomy, chemotherapy, and radiation therapy about 30 years ago. She does have a history of breast cancer in a niece and a paternal grandmother as well as uterine cancer and a maternal grandmother. She also has a history of 2 myocardial infarctions the last one was in 2015 and stents were placed. She is on Plavix. She also had a TIA in September of this year.   Past Surgical History  Breast Biopsy  Bilateral. Breast Mass; Local Excision  Left. Breast Reconstruction  Left. Colon Polyp Removal - Colonoscopy  Colon Polyp Removal - Open  Hysterectomy (due to cancer) - Complete  Shoulder Surgery  Left. Spinal Surgery - Lower Back  Valve Replacement   Diagnostic Studies History  Colonoscopy  within last year Mammogram  within last year Pap Smear  1-5 years ago  Allergies  Penicillins  Allergies Reconciled   Medication History  ALPRAZolam (1MG  Tablet, Oral) Active. amLODIPine Besylate (5MG  Tablet, Oral) Active. Clopidogrel Bisulfate (75MG  Tablet, Oral) Active. EPINEPHrine (0.3MG /0.3ML Soln Auto-inj, Injection) Active. Isosorbide Dinitrate (30MG  Tablet, Oral) Active. Metoprolol Tartrate (50MG  Tablet, Oral) Active. Pantoprazole Sodium (40MG  Tablet DR, Oral) Active. Thyroid (60MG   Tablet, Oral) Active. Topiramate (25MG  Tablet, Oral) Active. Medications Reconciled  Social History  Caffeine use  Coffee, Tea. No alcohol use  No drug use  Tobacco use  Former smoker.  Family History Alcohol Abuse  Brother, Daughter, Mother, Sister. Depression  Daughter. Heart disease in female family member before age 8  Heart disease in female family member before age 33  Hypertension  Brother, Mother. Respiratory Condition  Father.  Pregnancy / Birth History  Age at menarche  69 years. Age of menopause  48-50 Gravida  5 Length (months) of breastfeeding  3-6 Maternal age  <15 Para  3  Other Problems  Anxiety Disorder  Back Pain  Breast Cancer  Cancer  Cerebrovascular Accident  Diabetes Mellitus  Gastric Ulcer  Gastroesophageal Reflux Disease  High blood pressure  Hypercholesterolemia  Lump In Breast  Migraine Headache  Myocardial infarction  Seizure Disorder  Thyroid Disease  Vascular Disease     Review of Systems  General Present- Appetite Loss, Chills, Fatigue, Fever and Night Sweats. Not Present- Weight Gain and Weight Loss. Skin Not Present- Change in Wart/Mole, Dryness, Hives, Jaundice, New Lesions, Non-Healing Wounds, Rash and Ulcer. HEENT Present- Hearing Loss, Hoarseness, Ringing in the Ears, Sinus Pain and Wears glasses/contact lenses. Not Present- Earache, Nose Bleed, Oral Ulcers, Seasonal Allergies, Sore Throat, Visual Disturbances and Yellow Eyes. Respiratory Not Present- Bloody sputum, Chronic Cough, Difficulty Breathing, Snoring and Wheezing. Breast Present- Breast Mass. Not Present- Breast Pain, Nipple Discharge and Skin Changes. Cardiovascular Present- Leg Cramps and Swelling of Extremities. Not Present- Chest Pain, Difficulty Breathing Lying Down, Palpitations, Rapid Heart Rate and Shortness of Breath. Gastrointestinal Present-  Nausea and Vomiting. Not Present- Abdominal Pain, Bloating, Bloody Stool, Change in  Bowel Habits, Chronic diarrhea, Constipation, Difficulty Swallowing, Excessive gas, Gets full quickly at meals, Hemorrhoids, Indigestion and Rectal Pain. Female Genitourinary Not Present- Frequency, Nocturia, Painful Urination, Pelvic Pain and Urgency. Musculoskeletal Present- Back Pain and Joint Pain. Not Present- Joint Stiffness, Muscle Pain, Muscle Weakness and Swelling of Extremities. Neurological Present- Headaches and Tingling. Not Present- Decreased Memory, Fainting, Numbness, Seizures, Tremor, Trouble walking and Weakness. Psychiatric Not Present- Anxiety, Bipolar, Change in Sleep Pattern, Depression, Fearful and Frequent crying. Endocrine Present- Hot flashes. Not Present- Cold Intolerance, Excessive Hunger, Hair Changes, Heat Intolerance and New Diabetes. Hematology Present- Blood Thinners. Not Present- Easy Bruising, Excessive bleeding, Gland problems, HIV and Persistent Infections.  Vitals  Weight: 152.13 lb Height: 64in Body Surface Area: 1.74 m Body Mass Index: 26.11 kg/m  Temp.: 98.65F(Oral)  Pulse: 75 (Regular)  BP: 130/82 (Sitting, Left Arm, Standard)       Physical Exam  General Mental Status-Alert. General Appearance-Consistent with stated age. Hydration-Well hydrated. Voice-Normal.  Head and Neck Head-normocephalic, atraumatic with no lesions or palpable masses. Trachea-midline. Thyroid Gland Characteristics - normal size and consistency.  Eye Eyeball - Bilateral-Extraocular movements intact. Sclera/Conjunctiva - Bilateral-No scleral icterus.  Chest and Lung Exam Chest and lung exam reveals -quiet, even and easy respiratory effort with no use of accessory muscles and on auscultation, normal breath sounds, no adventitious sounds and normal vocal resonance. Inspection Chest Wall - Normal. Back - normal.  Breast Note: There is a 2 cm mobile mass in the upper outer right breast. There is no palpable mass in the left breast.  There is no palpable axillary, subclavicular, or cervical lymphadenopathy.   Cardiovascular Cardiovascular examination reveals -normal heart sounds, regular rate and rhythm with no murmurs and normal pedal pulses bilaterally.  Abdomen Inspection Inspection of the abdomen reveals - No Hernias. Skin - Scar - no surgical scars. Palpation/Percussion Palpation and Percussion of the abdomen reveal - Soft, Non Tender, No Rebound tenderness, No Rigidity (guarding) and No hepatosplenomegaly. Auscultation Auscultation of the abdomen reveals - Bowel sounds normal.  Neurologic Neurologic evaluation reveals -alert and oriented x 3 with no impairment of recent or remote memory. Mental Status-Normal.  Musculoskeletal Normal Exam - Left-Upper Extremity Strength Normal and Lower Extremity Strength Normal. Normal Exam - Right-Upper Extremity Strength Normal and Lower Extremity Strength Normal.  Lymphatic Head & Neck  General Head & Neck Lymphatics: Bilateral - Description - Normal. Axillary  General Axillary Region: Bilateral - Description - Normal. Tenderness - Non Tender. Femoral & Inguinal  Generalized Femoral & Inguinal Lymphatics: Bilateral - Description - Normal. Tenderness - Non Tender.    Assessment & Plan  BREAST CYST, RIGHT (N60.01) Impression: The patient appears to have a painful recurrent cyst in the right breast in the upper outer quadrant. Because of the recurrent nature of it and because of the pain associated with that she would like to have this removed and I think this is a reasonable thing to do. I have discussed with her in detail the risks and benefits of the operation as well as some of the technical aspects and she understands and wishes to proceed. Given her cardiac history she will need cardiac clearance from Dr. Domenic Polite and she will need to be off Plavix for 5 days prior to surgery.

## 2018-02-24 NOTE — Transfer of Care (Signed)
Immediate Anesthesia Transfer of Care Note  Patient: Bethany Wang  Procedure(s) Performed: RIGHT BREAST LUMPECTOMY ERAS PATHWAY (Right )  Patient Location: PACU  Anesthesia Type:General  Level of Consciousness: awake, alert  and oriented  Airway & Oxygen Therapy: Patient Spontanous Breathing and Patient connected to face mask oxygen  Post-op Assessment: Report given to RN and Post -op Vital signs reviewed and stable  Post vital signs: Reviewed and stable  Last Vitals:  Vitals Value Taken Time  BP 157/85 02/24/2018  4:58 PM  Temp    Pulse 75 02/24/2018  4:58 PM  Resp 18 02/24/2018  4:58 PM  SpO2 100 % 02/24/2018  4:58 PM  Vitals shown include unvalidated device data.  Last Pain:  Vitals:   02/24/18 1331  TempSrc:   PainSc: 0-No pain         Complications: No apparent anesthesia complications

## 2018-02-25 ENCOUNTER — Encounter (HOSPITAL_COMMUNITY): Payer: Self-pay | Admitting: General Surgery

## 2018-02-25 NOTE — Anesthesia Postprocedure Evaluation (Signed)
Anesthesia Post Note  Patient: Bethany Wang  Procedure(s) Performed: RIGHT BREAST LUMPECTOMY ERAS PATHWAY (Right )     Patient location during evaluation: PACU Anesthesia Type: General Level of consciousness: awake and alert Pain management: pain level controlled Vital Signs Assessment: post-procedure vital signs reviewed and stable Respiratory status: spontaneous breathing, nonlabored ventilation, respiratory function stable and patient connected to nasal cannula oxygen Cardiovascular status: blood pressure returned to baseline and stable Postop Assessment: no apparent nausea or vomiting Anesthetic complications: no    Last Vitals:  Vitals:   02/24/18 1715 02/24/18 1745  BP: (!) 159/89 (!) 154/87  Pulse: 74 70  Resp: 19 16  Temp:  (!) 36.2 C  SpO2: 100% 100%    Last Pain:  Vitals:   02/24/18 1820  TempSrc:   PainSc: 7                  [name] DAVID

## 2018-03-28 DIAGNOSIS — E119 Type 2 diabetes mellitus without complications: Secondary | ICD-10-CM | POA: Diagnosis not present

## 2018-03-30 DIAGNOSIS — G40802 Other epilepsy, not intractable, without status epilepticus: Secondary | ICD-10-CM | POA: Diagnosis not present

## 2018-03-30 NOTE — Progress Notes (Signed)
Cardiology Office Note  Date: 03/31/2018   ID: Bethany Wang, DOB [birth year], MRN [id number]  PCP: [name], MD  Primary Cardiologist: [name], MD   Chief Complaint  Patient presents with  . Coronary Artery Disease    History of Present Illness: [name] last seen in September.  She presents today for a follow-up visit with a family member.  She tells me that she has had a feeling of her heart skipping and stopping, occurs intermittently, sometimes wakes her up at nighttime.  She also has intermittent angina symptoms and reported spikes in her blood pressure.  She has a home nurse check in on her per Dr. [name].  She has had no syncope.  She is status post right breast lumpectomy in November by Dr. Marlou Starks.  I reviewed her current medications which are listed below.  She states that she has been compliant with full cardiac regimen.  I personally reviewed her ECG today which shows sinus rhythm with nonspecific ST-T changes.  Past Medical History:  Diagnosis Date  . Anemia   . Anxiety   . Bilateral breast cysts 12/30/2015  . Breast cancer ([place])    Left Breast Cancer  . BV (bacterial vaginosis) 09/09/2015  . Coronary atherosclerosis of native coronary artery    a. s/p DES to RCA in 2013 b. DES to LAD in 2014 c. cath in 04/2016 showing patent LAD stent with D2 jailed by LAD stent and CTO of RCA with left to right collaterals present  . Cyst of pharynx or nasopharynx    [name]  . Depression   . Essential hypertension   . Falls    x 3-4 in past year  . GERD (gastroesophageal reflux disease)   . History of breast cancer 09/09/2015  . History of hematuria   . Mixed hyperlipidemia   . Personal history of chemotherapy    Left Breast Cancer  . Personal history of radiation therapy    Left Breast Cancer  . Pre-diabetes   . PUD (peptic ulcer disease)   . Seizures ([place])   . Shingles 09/09/2015  . Stroke ([place])    12-2017 on Plavix, only deficit is headaches  . Suicide attempt [facility])    3 attempts in remote past  . Uterine cancer [facility])     Past Surgical History:  Procedure Laterality Date  . ABDOMINAL HYSTERECTOMY    . BLADDER SURGERY    . BREAST LUMPECTOMY Left   . BREAST LUMPECTOMY Right 02/24/2018   Procedure: RIGHT BREAST LUMPECTOMY ERAS PATHWAY;  Surgeon: Jovita Kussmaul, MD;  Location: [facility];  Service: General;  Laterality: Right;  . CARDIAC CATHETERIZATION N/A 05/10/2016   Procedure: Left Heart Cath and Coronary Angiography;  Surgeon: Belva Crome, MD;  Location: [facility] CV [facility];  Service: Cardiovascular;  Laterality: N/A;  . COLONOSCOPY  May 2010   [place]: normal rectum, internal hemorrhoids, , benign colonic polyp  . COLONOSCOPY N/A 01/05/2017   Procedure: COLONOSCOPY;  Surgeon: Rogene Houston, MD;  [facility];  Service: Endoscopy;  Laterality: N/A;  1:00  . ESOPHAGOGASTRODUODENOSCOPY     2001 Dr. Amedeo Plenty: distal esophagitis, small hiatal hernia,. Dr. Tamala Julian 2006? no records available currently, pt also reports  EGD a few years ago with Dr. Gala Romney, do not have these reports anywhere in medical records  . ESOPHAGOGASTRODUODENOSCOPY  06/02/2011   [facility]:DJSHFW pill impaction as described above s/p dilation of a probable  cervical esophageal web/bx abnormal esophageal and gastric mucosa. + H.pylori gastritis   . EYE SURGERY     Removed glass  . HAND SURGERY Right   . Left breast lumpectomy     Benign  . LEFT HEART CATHETERIZATION WITH CORONARY ANGIOGRAM N/A 07/03/2014   Procedure: LEFT HEART CATHETERIZATION WITH CORONARY ANGIOGRAM;  Surgeon: Belva Crome, MD;  Location: [facility] CATH LAB;  Service: Cardiovascular;  Laterality: N/A;  . POLYPECTOMY  01/05/2017   Procedure: POLYPECTOMY;  Surgeon: Rogene Houston, MD;  [facility];  Service: Endoscopy;;  colon  . SHOULDER SURGERY Left     Current Outpatient Medications  Medication Sig Dispense Refill  . ALPRAZolam (XANAX)  0.5 MG tablet Take 0.5 mg by mouth 2 (two) times daily as needed for anxiety.     [name] amLODipine (NORVASC) 5 MG tablet Take 5 mg by mouth daily.     [name] atorvastatin (LIPITOR) 40 MG tablet Take 1 tablet (40 mg total) by mouth daily at 6 PM. 30 tablet 0  . clopidogrel (PLAVIX) 75 MG tablet Take 75 mg by mouth daily.     [name] EPINEPHrine 0.3 mg/0.3 mL IJ SOAJ injection Inject 0.3 mg into the muscle once.     . escitalopram (LEXAPRO) 20 MG tablet Take 20 mg by mouth daily.     [name] HYDROcodone-acetaminophen (NORCO/VICODIN) 5-325 MG tablet Take 1-2 tablets by mouth every 6 (six) hours as needed for moderate pain or severe pain. 15 tablet 0  . isosorbide mononitrate (IMDUR) 30 MG 24 hr tablet Take 3 tablets (90 mg total) by mouth daily. 90 tablet 6  . losartan (COZAAR) 50 MG tablet Take 50 mg by mouth daily.    . metoprolol tartrate (LOPRESSOR) 50 MG tablet Take 1 tablet (50 mg total) by mouth 2 (two) times daily. 180 tablet 3  . mometasone (ASMANEX, 30 METERED DOSES,) 220 MCG/INH inhaler Inhale 1 puff into the lungs 2 (two) times daily.    [name] NITROSTAT 0.4 MG SL tablet PLACE 1 TAB UNDER TONGUE EVERY 5 MIN UP TO 3 TIMES AS NEEDED FOR CHEST PAIN, IF NO RELIEF CALL 911. (Patient taking differently: Place 0.4 mg under the tongue every 5 (five) minutes as needed for chest pain. ) 25 tablet 0  . pantoprazole (PROTONIX) 40 MG tablet Take 40 mg by mouth daily.     . potassium chloride SA (K-DUR,KLOR-CON) 20 MEQ tablet Take 1 tablet (20 mEq total) by mouth daily. 30 tablet 0  . thyroid (ARMOUR) 60 MG tablet Take 60 mg by mouth daily.     [name] topiramate (TOPAMAX) 25 MG tablet Take 25 mg by mouth 2 (two) times daily.      No current facility-administered medications for this visit.    Allergies:  Bee venom; Penicillins; and Shellfish allergy   Social History: The patient  reports that she quit smoking about 5 years ago. Her smoking use included cigarettes and cigars. She started smoking about 34 years ago. She has a 6.40  pack-year smoking history. She has never used smokeless tobacco. She reports that she does not drink alcohol or use drugs.   ROS:  Please see the history of present illness. Otherwise, complete review of systems is positive for anxiety.  All other systems are reviewed and negative.   Physical Exam: VS:  BP (!) 170/93   Pulse 70   Ht 5\' 4"  (1.626 m)   Wt 151 lb 9.6 oz (68.8 kg)   SpO2 100% Comment: on room  air  BMI 26.02 kg/m , BMI Body mass index is 26.02 kg/m.  Wt Readings from Last 3 Encounters:  03/31/18 151 lb 9.6 oz (68.8 kg)  02/21/18 150 lb (68 kg)  02/23/18 150 lb (68 kg)    General: Patient appears comfortable at rest. HEENT: Conjunctiva and lids normal, oropharynx clear. Neck: Supple, no elevated JVP or carotid bruits, no thyromegaly. Lungs: Clear to auscultation, nonlabored breathing at rest. Cardiac: Regular rate and rhythm, no S3 or significant systolic murmur. Abdomen: Soft, nontender, bowel sounds present. Extremities: No pitting edema, distal pulses 2+. Skin: Warm and dry. Musculoskeletal: No kyphosis. Neuropsychiatric: Alert and oriented x3, affect grossly appropriate.  ECG: I personally reviewed the tracing from 07/13/2017 which showed sinus rhythm with increased voltage.  Recent Labwork: 07/14/2017: Magnesium 2.2 02/23/2018: BUN 5; Creatinine, Ser 1.08; Hemoglobin 13.4; Platelets 240; Potassium 3.6; Sodium 140     Component Value Date/Time   CHOL 182 07/14/2017 0302   TRIG 218 (H) 07/14/2017 0302   HDL 47 07/14/2017 0302   CHOLHDL 3.9 07/14/2017 0302   VLDL 44 (H) 07/14/2017 0302   LDLCALC 91 07/14/2017 0302    Other Studies Reviewed Today:  Echocardiogram 12/12/2017 [facility]): Left Ventricle The left ventricle is normal in size, wall thickness and wall motion with ejection fraction of 60-65%. Grade I mild diastolic dysfunction; abnormal relaxation pattern.  Right Ventricle The right ventricle is grossly normal in size and function.  Atria The  left atrium is normal size. Injection of contrast documented no interatrial shunt . The right atrium is mildly dilated.  Mitral Valve The mitral valve is normal in structure and function. There is trace mitral regurgitation.  Tricuspid Valve The tricuspid valve leaflets are thin and pliable. There is trace tricuspid regurgitation. Estimation of right ventricular systolic pressure is not possible.  Aortic Valve The aortic valve is trileaflet with thin, pliable leaflets that move normally. There is mild to moderate [1-2+] aortic regurgitation present.  Pulmonic Valve The pulmonic valve is not well visualized. There is no pulmonic regurgitation.  Vessels The aortic root is normal in diameter.  Pericardium There is no pericardial effusion.  Brain MRI 12/11/2017 [facility]): FINDINGS:  There is no acute cortical infarct, intracranial hemorrhage, mass, or mass-effect.   The extra-axial spaces, basal cisterns, ventricles and sulci are normal. Intracranial flow-voids appear normal.   The orbits, cranium, mastoid sinuses, and visualized paranasal sinuses are unremarkable.   Incidental 13 mm Tornwaldt cyst in the nasopharynx.  Assessment and Plan:  1.  Intermittent palpitations, describes a feeling of skipping and a pause.  Could be having PVCs.  Before making any medication adjustments we will obtain a 48-hour Holter monitor.  She describes daily symptoms at this time.  No syncope.  2.  Multivessel CAD status post DES to the RCA in 2013, DES to the LAD in 2014, and cardiac catheterization from last year demonstrating patent LAD stent site with 75% second diagonal, 75-80% small obtuse marginal stenosis, and occluded RCA at previous stent site associated with left-to-right collaterals.  Medical therapy is planned at this point.  3.  History of TIA earlier in the year.  I recommended that she continue to follow with her neurologist.  She is on antiplatelet regimen and statin.  4.   Hyperlipidemia, on statin therapy at this point.  Previous LDL 91.  Current medicines were reviewed with the patient today.   Orders Placed This Encounter  Procedures  . HOLTER MONITOR - 48 HOUR  . EKG 12-Lead  Disposition: Call with monitor results and determine next step.  Signed, Satira Sark, MD, [facility] 03/31/2018 1:22 PM    [facility] [place], [place], [place] [address] Phone: [phone]; Fax: [phone]

## 2018-03-31 ENCOUNTER — Ambulatory Visit: Payer: Medicare HMO

## 2018-03-31 ENCOUNTER — Encounter: Payer: Self-pay | Admitting: Cardiology

## 2018-03-31 ENCOUNTER — Ambulatory Visit (INDEPENDENT_AMBULATORY_CARE_PROVIDER_SITE_OTHER): Payer: Medicare HMO | Admitting: Cardiology

## 2018-03-31 ENCOUNTER — Encounter: Payer: Self-pay | Admitting: *Deleted

## 2018-03-31 VITALS — BP 170/93 | HR 70 | Ht 64.0 in | Wt 151.6 lb

## 2018-03-31 DIAGNOSIS — I25119 Atherosclerotic heart disease of native coronary artery with unspecified angina pectoris: Secondary | ICD-10-CM

## 2018-03-31 DIAGNOSIS — R002 Palpitations: Secondary | ICD-10-CM

## 2018-03-31 DIAGNOSIS — Z8673 Personal history of transient ischemic attack (TIA), and cerebral infarction without residual deficits: Secondary | ICD-10-CM

## 2018-03-31 DIAGNOSIS — E782 Mixed hyperlipidemia: Secondary | ICD-10-CM

## 2018-03-31 NOTE — Patient Instructions (Addendum)
Medication Instructions:   Your physician recommends that you continue on your current medications as directed. Please refer to the Current Medication list given to you today.  Labwork:  NONE  Testing/Procedures: Your physician has recommended that you wear a holter monitor. Holter monitors are medical devices that record the heart's electrical activity. Doctors most often use these monitors to diagnose arrhythmias. Arrhythmias are problems with the speed or rhythm of the heartbeat. The monitor is a small, portable device. You can wear one while you do your normal daily activities. This is usually used to diagnose what is causing palpitations/syncope (passing out).  Follow-Up:  Your physician recommends that you schedule a follow-up appointment in: pending test result.  Any Other Special Instructions Will Be Listed Below (If Applicable).  If you need a refill on your cardiac medications before your next appointment, please call your pharmacy.

## 2018-04-10 DIAGNOSIS — M539 Dorsopathy, unspecified: Secondary | ICD-10-CM | POA: Diagnosis not present

## 2018-04-10 DIAGNOSIS — G8929 Other chronic pain: Secondary | ICD-10-CM | POA: Diagnosis not present

## 2018-04-10 DIAGNOSIS — H3530 Unspecified macular degeneration: Secondary | ICD-10-CM | POA: Diagnosis not present

## 2018-04-10 DIAGNOSIS — G40802 Other epilepsy, not intractable, without status epilepticus: Secondary | ICD-10-CM | POA: Diagnosis not present

## 2018-04-13 ENCOUNTER — Encounter (HOSPITAL_COMMUNITY): Payer: Self-pay | Admitting: Emergency Medicine

## 2018-04-13 ENCOUNTER — Other Ambulatory Visit: Payer: Self-pay

## 2018-04-13 ENCOUNTER — Emergency Department (HOSPITAL_COMMUNITY): Payer: Medicare Other

## 2018-04-13 ENCOUNTER — Emergency Department (HOSPITAL_COMMUNITY)
Admission: EM | Admit: 2018-04-13 | Discharge: 2018-04-13 | Disposition: A | Discharge status: Home / Self Care | Payer: Medicare Other | Attending: Emergency Medicine | Admitting: Emergency Medicine

## 2018-04-13 DIAGNOSIS — Z5321 Procedure and treatment not carried out due to patient leaving prior to being seen by health care provider: Secondary | ICD-10-CM | POA: Insufficient documentation

## 2018-04-13 DIAGNOSIS — R0602 Shortness of breath: Secondary | ICD-10-CM | POA: Diagnosis not present

## 2018-04-13 DIAGNOSIS — R070 Pain in throat: Secondary | ICD-10-CM | POA: Diagnosis not present

## 2018-04-13 LAB — CBC WITH DIFFERENTIAL/PLATELET
Abs Immature Granulocytes: 0.01 10*3/uL (ref 0.00–0.07)
BASOS ABS: 0 10*3/uL (ref 0.0–0.1)
Basophils Relative: 0 %
EOS ABS: 0 10*3/uL (ref 0.0–0.5)
Eosinophils Relative: 1 %
HCT: 39 % (ref 36.0–46.0)
Hemoglobin: 12.6 g/dL (ref 12.0–15.0)
IMMATURE GRANULOCYTES: 0 %
Lymphocytes Relative: 48 %
Lymphs Abs: 3.1 10*3/uL (ref 0.7–4.0)
MCH: 27.1 pg (ref 26.0–34.0)
MCHC: 32.3 g/dL (ref 30.0–36.0)
MCV: 83.9 fL (ref 80.0–100.0)
Monocytes Absolute: 0.4 10*3/uL (ref 0.1–1.0)
Monocytes Relative: 7 %
NEUTROS PCT: 44 %
Neutro Abs: 2.9 10*3/uL (ref 1.7–7.7)
PLATELETS: 218 10*3/uL (ref 150–400)
RBC: 4.65 MIL/uL (ref 3.87–5.11)
RDW: 13.5 % (ref 11.5–15.5)
WBC: 6.5 10*3/uL (ref 4.0–10.5)
nRBC: 0 % (ref 0.0–0.2)

## 2018-04-13 LAB — BASIC METABOLIC PANEL
ANION GAP: 7 (ref 5–15)
BUN: 7 mg/dL (ref 6–20)
CALCIUM: 9.1 mg/dL (ref 8.9–10.3)
CO2: 25 mmol/L (ref 22–32)
CREATININE: 0.74 mg/dL (ref 0.44–1.00)
Chloride: 108 mmol/L (ref 98–111)
Glucose, Bld: 98 mg/dL (ref 70–99)
Potassium: 3.4 mmol/L — ABNORMAL LOW (ref 3.5–5.1)
SODIUM: 140 mmol/L (ref 135–145)

## 2018-04-13 LAB — GROUP A STREP BY PCR: GROUP A STREP BY PCR: NOT DETECTED

## 2018-04-13 NOTE — ED Triage Notes (Addendum)
Patient complaining of sore throat x 1 week. States "I haven't taken any of my medications because it won't go down because of the swelling." States Dr Sharren Bridge sent patient here for chest x-ray and possible dehydration.

## 2018-04-13 NOTE — ED Notes (Signed)
Notified by registration that patient left. Patient left after triage but before being placed in room or seen by physician.

## 2018-04-17 DIAGNOSIS — N951 Menopausal and female climacteric states: Secondary | ICD-10-CM | POA: Diagnosis not present

## 2018-04-17 DIAGNOSIS — E119 Type 2 diabetes mellitus without complications: Secondary | ICD-10-CM | POA: Diagnosis not present

## 2018-04-17 DIAGNOSIS — R5383 Other fatigue: Secondary | ICD-10-CM | POA: Diagnosis not present

## 2018-04-17 DIAGNOSIS — J209 Acute bronchitis, unspecified: Secondary | ICD-10-CM | POA: Diagnosis not present

## 2018-04-17 DIAGNOSIS — I10 Essential (primary) hypertension: Secondary | ICD-10-CM | POA: Diagnosis not present

## 2018-04-17 DIAGNOSIS — E559 Vitamin D deficiency, unspecified: Secondary | ICD-10-CM | POA: Diagnosis not present

## 2018-04-17 DIAGNOSIS — E785 Hyperlipidemia, unspecified: Secondary | ICD-10-CM | POA: Diagnosis not present

## 2018-04-19 ENCOUNTER — Telehealth: Payer: Self-pay | Admitting: *Deleted

## 2018-04-27 ENCOUNTER — Encounter: Payer: Self-pay | Admitting: *Deleted

## 2018-04-27 NOTE — Telephone Encounter (Signed)
Letter mailed to patient.

## 2018-05-17 ENCOUNTER — Telehealth: Payer: Self-pay | Admitting: *Deleted

## 2018-05-17 DIAGNOSIS — R5383 Other fatigue: Secondary | ICD-10-CM | POA: Diagnosis not present

## 2018-05-17 DIAGNOSIS — E559 Vitamin D deficiency, unspecified: Secondary | ICD-10-CM | POA: Diagnosis not present

## 2018-05-17 DIAGNOSIS — N951 Menopausal and female climacteric states: Secondary | ICD-10-CM | POA: Diagnosis not present

## 2018-05-17 NOTE — Telephone Encounter (Signed)
[employer] called patient today to get information about the location of her drop off for her heart monitor but did not get an answer.

## 2018-06-06 ENCOUNTER — Telehealth: Payer: Self-pay | Admitting: *Deleted

## 2018-06-06 ENCOUNTER — Ambulatory Visit (INDEPENDENT_AMBULATORY_CARE_PROVIDER_SITE_OTHER): Payer: Medicare Other

## 2018-06-06 DIAGNOSIS — R002 Palpitations: Secondary | ICD-10-CM

## 2018-06-06 NOTE — Progress Notes (Unsigned)
Came to office to have holter monitor replaced since last monitor got lost in the mail. Patient asked to have BP checked while here. Patient advised to contact her PCP about her elevated BP today. Verbalized understanding.

## 2018-06-06 NOTE — Telephone Encounter (Signed)
Holter monitor placed on patient in December. Monitor taken to a local [employer] by patient. Per [employer], they never received the monitor back, so no baseline or result ever obtained. Patient contacted to come to office to have new monitor placed that she will bring back to office if she was willing to still have this test done. Patient agreed and coming today to have holter replaced.

## 2018-06-12 DIAGNOSIS — J209 Acute bronchitis, unspecified: Secondary | ICD-10-CM | POA: Diagnosis not present

## 2018-06-12 DIAGNOSIS — K529 Noninfective gastroenteritis and colitis, unspecified: Secondary | ICD-10-CM | POA: Diagnosis not present

## 2018-06-13 ENCOUNTER — Telehealth: Payer: Self-pay | Admitting: *Deleted

## 2018-06-13 NOTE — Telephone Encounter (Signed)
-----   Message from Satira Sark, MD sent at 06/12/2018 12:44 PM EST ----- Results reviewed.  No arrhythmias noted, but she may be feeling palpitations with atrial and ventricular ectopy as outlined.  We could try and increase her metoprolol to 75 mg twice daily. A copy of this test should be forwarded to [name], MD.

## 2018-06-15 MED ORDER — METOPROLOL TARTRATE 50 MG PO TABS: 75.0000 mg | ORAL_TABLET | Freq: Two times a day (BID) | ORAL | 3 refills | Status: DC

## 2018-06-15 NOTE — Telephone Encounter (Signed)
Patient informed and agrees with plan. New rx sent to [place].

## 2018-06-19 ENCOUNTER — Ambulatory Visit (HOSPITAL_COMMUNITY)
Admission: RE | Admit: 2018-06-19 | Discharge: 2018-06-19 | Disposition: A | Discharge status: Home / Self Care | Payer: Medicare Other | Source: Ambulatory Visit | Attending: Internal Medicine | Admitting: Internal Medicine

## 2018-06-19 ENCOUNTER — Other Ambulatory Visit (HOSPITAL_COMMUNITY): Payer: Self-pay | Admitting: Internal Medicine

## 2018-06-19 ENCOUNTER — Other Ambulatory Visit: Payer: Self-pay | Admitting: Internal Medicine

## 2018-06-19 DIAGNOSIS — R1900 Intra-abdominal and pelvic swelling, mass and lump, unspecified site: Secondary | ICD-10-CM

## 2018-06-19 DIAGNOSIS — R1013 Epigastric pain: Secondary | ICD-10-CM | POA: Diagnosis not present

## 2018-06-19 LAB — POCT I-STAT CREATININE: CREATININE: 0.7 mg/dL (ref 0.44–1.00)

## 2018-06-19 MED ORDER — IOHEXOL 300 MG/ML  SOLN: 30.0000 mL | Freq: Once | INTRAMUSCULAR | Status: DC | PRN

## 2018-06-19 MED ORDER — IOHEXOL 300 MG/ML  SOLN
100.0000 mL | Freq: Once | INTRAMUSCULAR | Status: AC | PRN
  Administered 2018-06-19: 100 mL via INTRAVENOUS

## 2018-07-04 ENCOUNTER — Encounter (INDEPENDENT_AMBULATORY_CARE_PROVIDER_SITE_OTHER): Payer: Self-pay | Admitting: Internal Medicine

## 2018-07-06 ENCOUNTER — Encounter (INDEPENDENT_AMBULATORY_CARE_PROVIDER_SITE_OTHER): Payer: Self-pay | Admitting: Internal Medicine

## 2018-10-03 ENCOUNTER — Ambulatory Visit (HOSPITAL_COMMUNITY)
Admission: RE | Admit: 2018-10-03 | Discharge: 2018-10-03 | Disposition: A | Discharge status: Home / Self Care | Payer: Medicare Other | Source: Ambulatory Visit | Attending: Internal Medicine | Admitting: Internal Medicine

## 2018-10-03 ENCOUNTER — Other Ambulatory Visit: Payer: Self-pay

## 2018-10-03 ENCOUNTER — Other Ambulatory Visit (HOSPITAL_COMMUNITY): Payer: Self-pay | Admitting: Internal Medicine

## 2018-10-03 DIAGNOSIS — M79604 Pain in right leg: Secondary | ICD-10-CM | POA: Diagnosis present

## 2018-10-24 ENCOUNTER — Emergency Department (HOSPITAL_COMMUNITY): Payer: Medicare Other

## 2018-10-24 ENCOUNTER — Observation Stay (HOSPITAL_COMMUNITY)
Admission: EM | Admit: 2018-10-24 | Discharge: 2018-10-26 | Disposition: A | Discharge status: Home / Self Care | Payer: Medicare Other | Attending: Cardiovascular Disease | Admitting: Cardiovascular Disease

## 2018-10-24 ENCOUNTER — Observation Stay (HOSPITAL_COMMUNITY): Payer: Medicare Other

## 2018-10-24 ENCOUNTER — Telehealth: Payer: Self-pay | Admitting: Cardiology

## 2018-10-24 ENCOUNTER — Encounter (HOSPITAL_COMMUNITY): Payer: Self-pay | Admitting: Emergency Medicine

## 2018-10-24 ENCOUNTER — Other Ambulatory Visit: Payer: Self-pay

## 2018-10-24 DIAGNOSIS — I25110 Atherosclerotic heart disease of native coronary artery with unstable angina pectoris: Principal | ICD-10-CM | POA: Insufficient documentation

## 2018-10-24 DIAGNOSIS — I200 Unstable angina: Secondary | ICD-10-CM | POA: Diagnosis present

## 2018-10-24 DIAGNOSIS — Z7901 Long term (current) use of anticoagulants: Secondary | ICD-10-CM | POA: Insufficient documentation

## 2018-10-24 DIAGNOSIS — Z8249 Family history of ischemic heart disease and other diseases of the circulatory system: Secondary | ICD-10-CM | POA: Insufficient documentation

## 2018-10-24 DIAGNOSIS — E782 Mixed hyperlipidemia: Secondary | ICD-10-CM | POA: Diagnosis present

## 2018-10-24 DIAGNOSIS — Z880 Allergy status to penicillin: Secondary | ICD-10-CM | POA: Diagnosis not present

## 2018-10-24 DIAGNOSIS — Z7902 Long term (current) use of antithrombotics/antiplatelets: Secondary | ICD-10-CM | POA: Insufficient documentation

## 2018-10-24 DIAGNOSIS — Z90710 Acquired absence of both cervix and uterus: Secondary | ICD-10-CM | POA: Diagnosis not present

## 2018-10-24 DIAGNOSIS — Z7951 Long term (current) use of inhaled steroids: Secondary | ICD-10-CM | POA: Insufficient documentation

## 2018-10-24 DIAGNOSIS — R7303 Prediabetes: Secondary | ICD-10-CM | POA: Insufficient documentation

## 2018-10-24 DIAGNOSIS — R079 Chest pain, unspecified: Secondary | ICD-10-CM | POA: Diagnosis present

## 2018-10-24 DIAGNOSIS — Z79899 Other long term (current) drug therapy: Secondary | ICD-10-CM | POA: Insufficient documentation

## 2018-10-24 DIAGNOSIS — Z8673 Personal history of transient ischemic attack (TIA), and cerebral infarction without residual deficits: Secondary | ICD-10-CM | POA: Insufficient documentation

## 2018-10-24 DIAGNOSIS — R072 Precordial pain: Secondary | ICD-10-CM

## 2018-10-24 DIAGNOSIS — Z1159 Encounter for screening for other viral diseases: Secondary | ICD-10-CM | POA: Diagnosis not present

## 2018-10-24 DIAGNOSIS — H538 Other visual disturbances: Secondary | ICD-10-CM | POA: Diagnosis not present

## 2018-10-24 DIAGNOSIS — I10 Essential (primary) hypertension: Secondary | ICD-10-CM | POA: Diagnosis not present

## 2018-10-24 DIAGNOSIS — K219 Gastro-esophageal reflux disease without esophagitis: Secondary | ICD-10-CM | POA: Diagnosis not present

## 2018-10-24 DIAGNOSIS — R51 Headache: Secondary | ICD-10-CM | POA: Insufficient documentation

## 2018-10-24 DIAGNOSIS — Z955 Presence of coronary angioplasty implant and graft: Secondary | ICD-10-CM | POA: Diagnosis not present

## 2018-10-24 DIAGNOSIS — Z7982 Long term (current) use of aspirin: Secondary | ICD-10-CM | POA: Insufficient documentation

## 2018-10-24 DIAGNOSIS — I351 Nonrheumatic aortic (valve) insufficiency: Secondary | ICD-10-CM | POA: Insufficient documentation

## 2018-10-24 DIAGNOSIS — I209 Angina pectoris, unspecified: Secondary | ICD-10-CM

## 2018-10-24 LAB — SARS CORONAVIRUS 2 BY RT PCR (HOSPITAL ORDER, PERFORMED IN ~~LOC~~ HOSPITAL LAB): SARS Coronavirus 2: NEGATIVE

## 2018-10-24 LAB — RAPID URINE DRUG SCREEN, HOSP PERFORMED
Amphetamines: NOT DETECTED
Barbiturates: NOT DETECTED
Benzodiazepines: NOT DETECTED
Cocaine: NOT DETECTED
Opiates: POSITIVE — AB
Tetrahydrocannabinol: NOT DETECTED

## 2018-10-24 LAB — BASIC METABOLIC PANEL
Anion gap: 6 (ref 5–15)
BUN: 11 mg/dL (ref 6–20)
CO2: 27 mmol/L (ref 22–32)
Calcium: 9 mg/dL (ref 8.9–10.3)
Chloride: 105 mmol/L (ref 98–111)
Creatinine, Ser: 0.74 mg/dL (ref 0.44–1.00)
GFR calc Af Amer: >60 mL/min (ref >60)
GFR calc non Af Amer: >60 mL/min (ref >60)
Glucose, Bld: 95 mg/dL (ref 70–99)
Potassium: 4.1 mmol/L (ref 3.5–5.1)
Sodium: 138 mmol/L (ref 135–145)

## 2018-10-24 LAB — CBC
HCT: 42.4 % (ref 36.0–46.0)
Hemoglobin: 13.6 g/dL (ref 12.0–15.0)
MCH: 27.3 pg (ref 26.0–34.0)
MCHC: 32.1 g/dL (ref 30.0–36.0)
MCV: 85.1 fL (ref 80.0–100.0)
Platelets: 266 10*3/uL (ref 150–400)
RBC: 4.98 MIL/uL (ref 3.87–5.11)
RDW: 14.2 % (ref 11.5–15.5)
WBC: 7.3 10*3/uL (ref 4.0–10.5)
nRBC: 0 % (ref 0.0–0.2)

## 2018-10-24 LAB — URINALYSIS, ROUTINE W REFLEX MICROSCOPIC
Bacteria, UA: NONE SEEN
Bilirubin Urine: NEGATIVE
Glucose, UA: NEGATIVE mg/dL
Ketones, ur: NEGATIVE mg/dL
Leukocytes,Ua: NEGATIVE
Nitrite: NEGATIVE
Protein, ur: NEGATIVE mg/dL
Specific Gravity, Urine: 1.012 (ref 1.005–1.030)
pH: 6 (ref 5.0–8.0)

## 2018-10-24 LAB — TROPONIN I (HIGH SENSITIVITY)
Troponin I (High Sensitivity): 3 ng/L (ref <18)
Troponin I (High Sensitivity): 3 ng/L (ref <18)
Troponin I (High Sensitivity): 5 ng/L (ref <18)

## 2018-10-24 LAB — D-DIMER, QUANTITATIVE: D-Dimer, Quant: 0.39 ug/mL-FEU (ref 0.00–0.50)

## 2018-10-24 LAB — GLUCOSE, CAPILLARY: Glucose-Capillary: 131 mg/dL — ABNORMAL HIGH (ref 70–99)

## 2018-10-24 MED ORDER — MORPHINE SULFATE (PF) 2 MG/ML IV SOLN
2.0000 mg | INTRAVENOUS | Status: DC | PRN
  Administered 2018-10-24 – 2018-10-25 (×2): 2 mg via INTRAVENOUS
  Filled 2018-10-24 (×2): qty 1

## 2018-10-24 MED ORDER — ISOSORBIDE MONONITRATE ER 60 MG PO TB24
90.0000 mg | ORAL_TABLET | Freq: Every day | ORAL | Status: DC
  Administered 2018-10-24 – 2018-10-26 (×3): 90 mg via ORAL
  Filled 2018-10-24 (×2): qty 2
  Filled 2018-10-24: qty 1

## 2018-10-24 MED ORDER — ASPIRIN EC 81 MG PO TBEC
162.0000 mg | DELAYED_RELEASE_TABLET | Freq: Every day | ORAL | Status: DC
  Administered 2018-10-25 (×2): 162 mg via ORAL
  Filled 2018-10-24 (×3): qty 2

## 2018-10-24 MED ORDER — ACETAMINOPHEN 325 MG PO TABS: 650.0000 mg | ORAL_TABLET | Freq: Four times a day (QID) | ORAL | Status: DC | PRN

## 2018-10-24 MED ORDER — AMLODIPINE BESYLATE 5 MG PO TABS
5.0000 mg | ORAL_TABLET | Freq: Every day | ORAL | Status: DC
  Administered 2018-10-24: 5 mg via ORAL
  Filled 2018-10-24: qty 1

## 2018-10-24 MED ORDER — NITROGLYCERIN 0.4 MG SL SUBL
0.4000 mg | SUBLINGUAL_TABLET | Freq: Once | SUBLINGUAL | Status: AC
  Administered 2018-10-24: 0.4 mg via SUBLINGUAL
  Filled 2018-10-24: qty 1

## 2018-10-24 MED ORDER — ONDANSETRON HCL 4 MG/2ML IJ SOLN: 4.0000 mg | Freq: Four times a day (QID) | INTRAMUSCULAR | Status: DC | PRN

## 2018-10-24 MED ORDER — LOSARTAN POTASSIUM 50 MG PO TABS
50.0000 mg | ORAL_TABLET | Freq: Every day | ORAL | Status: DC
  Administered 2018-10-26: 50 mg via ORAL
  Filled 2018-10-24: qty 1

## 2018-10-24 MED ORDER — METOPROLOL TARTRATE 50 MG PO TABS
50.0000 mg | ORAL_TABLET | Freq: Two times a day (BID) | ORAL | Status: DC
  Administered 2018-10-24 – 2018-10-25 (×2): 50 mg via ORAL
  Filled 2018-10-24 (×2): qty 1

## 2018-10-24 MED ORDER — NITROGLYCERIN 0.4 MG SL SUBL
0.4000 mg | SUBLINGUAL_TABLET | SUBLINGUAL | Status: DC | PRN
  Administered 2018-10-24 (×3): 0.4 mg via SUBLINGUAL
  Filled 2018-10-24 (×2): qty 1

## 2018-10-24 MED ORDER — ACETAMINOPHEN 650 MG RE SUPP: 650.0000 mg | Freq: Four times a day (QID) | RECTAL | Status: DC | PRN

## 2018-10-24 MED ORDER — METOPROLOL TARTRATE 50 MG PO TABS
100.0000 mg | ORAL_TABLET | Freq: Two times a day (BID) | ORAL | Status: DC
  Filled 2018-10-24: qty 2

## 2018-10-24 MED ORDER — POLYETHYLENE GLYCOL 3350 17 G PO PACK: 17.0000 g | PACK | Freq: Every day | ORAL | Status: DC | PRN

## 2018-10-24 MED ORDER — ONDANSETRON HCL 4 MG PO TABS: 4.0000 mg | ORAL_TABLET | Freq: Four times a day (QID) | ORAL | Status: DC | PRN

## 2018-10-24 MED ORDER — CLOPIDOGREL BISULFATE 75 MG PO TABS
75.0000 mg | ORAL_TABLET | Freq: Every day | ORAL | Status: DC
  Administered 2018-10-25 – 2018-10-26 (×2): 75 mg via ORAL
  Filled 2018-10-24 (×2): qty 1

## 2018-10-24 MED ORDER — ATORVASTATIN CALCIUM 40 MG PO TABS
40.0000 mg | ORAL_TABLET | Freq: Every day | ORAL | Status: DC
  Administered 2018-10-25: 40 mg via ORAL
  Filled 2018-10-24: qty 1

## 2018-10-24 MED ORDER — HYDRALAZINE HCL 20 MG/ML IJ SOLN: 10.0000 mg | INTRAMUSCULAR | Status: DC | PRN

## 2018-10-24 MED ORDER — LINACLOTIDE 145 MCG PO CAPS
145.0000 ug | ORAL_CAPSULE | Freq: Every day | ORAL | Status: DC
  Administered 2018-10-25 – 2018-10-26 (×2): 145 ug via ORAL
  Filled 2018-10-24 (×2): qty 1

## 2018-10-24 MED ORDER — MORPHINE SULFATE (PF) 4 MG/ML IV SOLN
4.0000 mg | Freq: Once | INTRAVENOUS | Status: AC
  Administered 2018-10-24: 4 mg via INTRAVENOUS
  Filled 2018-10-24: qty 1

## 2018-10-24 MED ORDER — ALPRAZOLAM 0.5 MG PO TABS
0.5000 mg | ORAL_TABLET | Freq: Every morning | ORAL | Status: DC
  Administered 2018-10-25 – 2018-10-26 (×2): 0.5 mg via ORAL
  Filled 2018-10-24 (×2): qty 1

## 2018-10-24 MED ORDER — ENOXAPARIN SODIUM 40 MG/0.4ML ~~LOC~~ SOLN
40.0000 mg | SUBCUTANEOUS | Status: DC
  Administered 2018-10-24 – 2018-10-25 (×2): 40 mg via SUBCUTANEOUS
  Filled 2018-10-24 (×2): qty 0.4

## 2018-10-24 MED ORDER — SODIUM CHLORIDE 0.9% FLUSH: 3.0000 mL | Freq: Once | INTRAVENOUS | Status: DC

## 2018-10-24 NOTE — Telephone Encounter (Signed)
Pt called stating that she's swelling and she's having chest pain, she's been taking her Nitro

## 2018-10-24 NOTE — ED Provider Notes (Signed)
Emergency Department Provider Note   I have reviewed the triage vital signs and the nursing notes.   HISTORY  Chief Complaint Chest Pain   HPI Bethany Wang is a 53 y.o. female with past medical history of CAD status post CABG presents to the emergency department with chest pain, nausea, weakness, and diaphoresis.  Patient states she has been feeling badly for the past 3 days with mainly intermittent symptoms.  Her chest pain is pressure-like and now has become constant.  Patient states she is under a significant amount of stress at home but that the pain feels similar to prior episodes of bypass failure and AMI.  She took full dose aspirin at home along with 2 nitroglycerin with no improvement in symptoms.  She denies fevers, chills, shortness of breath, cough.  She has had vomiting without blood.  No abdominal pain or diarrhea.  She called her cardiologist office, Dr. Domenic Polite, who referred her to the emergency department for evaluation.  Past Medical History:  Diagnosis Date  . Anemia   . Anxiety   . Bilateral breast cysts 12/30/2015  . Breast cancer ([place])    Left Breast Cancer  . BV (bacterial vaginosis) 09/09/2015  . Coronary atherosclerosis of native coronary artery    a. s/p DES to RCA in 2013 b. DES to LAD in 2014 c. cath in 04/2016 showing patent LAD stent with D2 jailed by LAD stent and CTO of RCA with left to right collaterals present  . Cyst of pharynx or nasopharynx    [name]  . Depression   . Essential hypertension   . Falls    x 3-4 in past year  . GERD (gastroesophageal reflux disease)   . History of breast cancer 09/09/2015  . History of hematuria   . Mixed hyperlipidemia   . Personal history of chemotherapy    Left Breast Cancer  . Personal history of radiation therapy    Left Breast Cancer  . Pre-diabetes   . PUD (peptic ulcer disease)   . Seizures ([place])   . Shingles 09/09/2015  . Stroke ([place])    12-2017 on Plavix, only deficit is  headaches  . Suicide attempt [facility])    3 attempts in remote past  . Uterine cancer [facility])     Patient Active Problem List   Diagnosis Date Noted  . Palpitations 06/06/2018  . Chest pain 07/13/2017  . Nonspecific chest pain 07/13/2017  . Essential hypertension 07/13/2017  . Mixed hyperlipidemia 07/13/2017  . Precordial chest pain   . Hypertensive urgency 06/15/2016  . Unstable angina pectoris ([place]) 06/15/2016  . Unstable angina ([place]) 06/15/2016  . Bilateral breast cysts 12/30/2015  . Vaginal discharge 09/09/2015  . BV (bacterial vaginosis) 09/09/2015  . Shingles 09/09/2015  . History of breast cancer 09/09/2015  . Syncope and collapse 08/08/2015  . Migraine with aura and with status migrainosus, not intractable 08/08/2015  . Chronic low back pain 08/08/2015  . Insomnia 08/08/2015  . Anxiety state 08/08/2015  . Panic attacks 08/08/2015  . Anemia, iron deficiency 08/08/2015  . Pica 08/08/2015  . Angina decubitus ([place]) 07/03/2014  . 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 Encounter for gynecological examination with Papanicolaou smear of cervix 02/04/2014  . Syncope 12/14/2013  . Cervical disc disorder with radiculopathy of cervical region 02/19/2013  . H/O rotator cuff surgery 02/19/2013  . Dyspareunia 02/01/2013  . Left shoulder pain 01/24/2013  . Rotator cuff tear 01/24/2013  . Radicular pain 01/24/2013  . Labral  tear of shoulder 01/24/2013  . Complex regional pain syndrome of upper extremity 01/24/2013  . Herniated disc, cervical 01/24/2013  . Abdominal pain, other specified site 01/18/2013  . Nausea and vomiting 01/18/2013  . Rectal bleeding 01/18/2013  . Edema of left foot 10/16/2012  . Coronary atherosclerosis of native coronary artery   . Essential hypertension, benign   . HLD (hyperlipidemia)   . Tobacco abuse     Past Surgical History:  Procedure Laterality Date  . ABDOMINAL HYSTERECTOMY    . BLADDER SURGERY    . BREAST LUMPECTOMY Left   . BREAST  LUMPECTOMY Right 02/24/2018   Procedure: RIGHT BREAST LUMPECTOMY ERAS PATHWAY;  Surgeon: Jovita Kussmaul, MD;  Location: [place];  Service: General;  Laterality: Right;  . CARDIAC CATHETERIZATION N/A 05/10/2016   Procedure: Left Heart Cath and Coronary Angiography;  Surgeon: Belva Crome, MD;  Location: [place] [facility];  Service: Cardiovascular;  Laterality: N/A;  . COLONOSCOPY  May 2010   Fle[place]man: normal rectum, internal hemorrhoids, , benign colonic polyp  . COLONOSCOPY N/A 01/05/2017   Procedure: COLONOSCOPY;  Surgeon: Rogene Houston, MD;  [facility];  Service: Endoscopy;  Laterality: N/A;  1:00  . ESOPHAGOGASTRODUODENOSCOPY     2001 Dr. Amedeo Plenty: distal esophagitis, small hiatal hernia,. Dr. Tamala Julian 2006? no records available currently, pt also reports  EGD a few years ago with Dr. Gala Romney, do not have these reports anywhere in medical records  . ESOPHAGOGASTRODUODENOSCOPY  06/02/2011   KDT:OIZTIW pill impaction as described above s/p dilation of a probable cervical esophageal web/bx abnormal esophageal and gastric mucosa. + H.pylori gastritis   . EYE SURGERY     Removed glass  . HAND SURGERY Right   . Left breast lumpectomy     Benign  . LEFT HEART CATHETERIZATION WITH CORONARY ANGIOGRAM N/A 07/03/2014   Procedure: LEFT HEART CATHETERIZATION WITH CORONARY ANGIOGRAM;  Surgeon: Belva Crome, MD;  Location: [facility] CATH LAB;  Service: Cardiovascular;  Laterality: N/A;  . POLYPECTOMY  01/05/2017   Procedure: POLYPECTOMY;  Surgeon: Rogene Houston, MD;  [facility];  Service: Endoscopy;;  colon  . SHOULDER SURGERY Left     Allergies Bee venom, Penicillins, and Shellfish allergy  Family History  Problem Relation Age of Onset  . Colon cancer Paternal Grandfather   . Cancer Father   . Cancer Maternal Uncle   . Alzheimer's disease Paternal Aunt   . Cirrhosis Maternal Uncle   . Cancer Paternal Aunt        lung  . Heart disease Brother   . Heart attack Brother   .  Mental illness Daughter   . ADD / ADHD Daughter   . Bipolar disorder Daughter   . Mental illness Son   . ADD / ADHD Son   . Bipolar disorder Son   . Mental illness Son   . ADD / ADHD Son   . Bipolar disorder Son     Social History Social History   Tobacco Use  . Smoking status: Former Smoker    Packs/day: 0.20    Years: 32.00    Pack years: 6.40    Types: Cigarettes, Cigars    Start date: 07/05/1983    Quit date: 04/03/2012    Years since quitting: 6.5  . Smokeless tobacco: Never Used  Substance Use Topics  . Alcohol use: No    Alcohol/week: 0.0 standard drinks  . Drug use: No    Review of Systems  Constitutional:  No fever/chills. Positive weakness.  Eyes: No visual changes. ENT: No sore throat. Cardiovascular: Positive chest pain. Respiratory: Denies shortness of breath. Gastrointestinal: No abdominal pain. Positive nausea and vomiting.  No diarrhea.  No constipation. Genitourinary: Negative for dysuria. Musculoskeletal: Negative for back pain. Skin: Negative for rash. Neurological: Negative for headaches, focal weakness or numbness.  10-point ROS otherwise negative.  ____________________________________________   PHYSICAL EXAM:  VITAL SIGNS: ED Triage Vitals  Enc Vitals Group     BP 10/24/18 1232 (!) 168/114     Pulse Rate 10/24/18 1232 75     Resp 10/24/18 1232 16     Temp 10/24/18 1232 98.3 F (36.8 C)     Temp Source 10/24/18 1232 Oral     SpO2 10/24/18 1232 98 %     Pain Score 10/24/18 1239 10   Constitutional: Alert and oriented. Well appearing and in no acute distress. Eyes: Conjunctivae are normal.  Head: Atraumatic. Nose: No congestion/rhinnorhea. Mouth/Throat: Mucous membranes are moist.  Oropharynx non-erythematous. Neck: No stridor.  Cardiovascular: Normal rate, regular rhythm. Good peripheral circulation. Grossly normal heart sounds.   Respiratory: Normal respiratory effort.  No retractions. Lungs CTAB. Gastrointestinal: Soft and  nontender. No distention.  Musculoskeletal: No lower extremity tenderness nor edema. No gross deformities of extremities. Neurologic:  Normal speech and language. No gross focal neurologic deficits are appreciated.  Skin:  Skin is warm, dry and intact. No rash noted. Psychiatric: Mood and affect are normal. Speech and behavior are normal.  ____________________________________________   LABS (all labs ordered are listed, but only abnormal results are displayed)  Labs Reviewed  SARS CORONAVIRUS 2 (HOSPITAL ORDER, [place] [facility])  CBC  BASIC METABOLIC PANEL  D-DIMER, QUANTITATIVE (NOT AT [facility])  URINALYSIS, ROUTINE W REFLEX MICROSCOPIC  HIV ANTIBODY (ROUTINE TESTING W REFLEX)  RAPID URINE DRUG SCREEN, HOSP PERFORMED  TROPONIN I (HIGH SENSITIVITY)  TROPONIN I (HIGH SENSITIVITY)  TROPONIN I (HIGH SENSITIVITY)   ____________________________________________  EKG   EKG Interpretation  Date/Time:  Tuesday October 24 2018 12:34:39 EDT Ventricular Rate:  70 PR Interval:    QRS Duration: 104 QT Interval:  406 QTC Calculation: 439 R Axis:   63 Text Interpretation:  Sinus rhythm Left ventricular hypertrophy No STEMI  Confirmed by Nanda Quinton [phone]) on 10/24/2018 12:57:19 PM       ____________________________________________  RADIOLOGY  Dg Chest 2 View  Result Date: 10/24/2018 CLINICAL DATA:  Chest pain EXAM: CHEST - 2 VIEW COMPARISON:  April 13, 2018 FINDINGS: The heart size and mediastinal contours are within normal limits. The aorta is tortuous. Both lungs are clear. The visualized skeletal structures are stable. IMPRESSION: No active cardiopulmonary disease. Electronically Signed   By: Abelardo Diesel M.D.   On: 10/24/2018 13:29    ____________________________________________   PROCEDURES  Procedure(s) performed:   Procedures  None ____________________________________________   INITIAL IMPRESSION / ASSESSMENT AND PLAN / ED COURSE  Pertinent  labs & imaging results that were available during my care of the patient were reviewed by me and considered in my medical decision making (see chart for details).   Patient presents to the emergency department with chest discomfort. History of CAD s/p CABG with pain similar to prior ACS over the last few days. Initial labs and CXR reviewed and unremarkable. Pain improved with morphine. Considered PE but feel this is much less likely. Plan for obs admit for CP.   Discussed patient's case with Hospitalist to request admission. Patient and family (if present) updated  with plan. Care transferred to Hospitalist service.  I reviewed all nursing notes, vitals, pertinent old records, EKGs, labs, imaging (as available).    ____________________________________________  FINAL CLINICAL IMPRESSION(S) / ED DIAGNOSES  Final diagnoses:  Precordial chest pain     MEDICATIONS GIVEN DURING THIS VISIT:  Medications  enoxaparin (LOVENOX) injection 40 mg (has no administration in time range)  acetaminophen (TYLENOL) tablet 650 mg (has no administration in time range)    Or  acetaminophen (TYLENOL) suppository 650 mg (has no administration in time range)  ondansetron (ZOFRAN) tablet 4 mg (has no administration in time range)    Or  ondansetron (ZOFRAN) injection 4 mg (has no administration in time range)  polyethylene glycol (MIRALAX / GLYCOLAX) packet 17 g (has no administration in time range)  nitroGLYCERIN (NITROSTAT) SL tablet 0.4 mg (0.4 mg Sublingual Given 10/24/18 1911)  morphine 2 MG/ML injection 2 mg (2 mg Intravenous Given 10/24/18 1942)  ALPRAZolam (XANAX) tablet 0.5 mg (has no administration in time range)  aspirin EC tablet 162 mg (has no administration in time range)  atorvastatin (LIPITOR) tablet 40 mg (has no administration in time range)  linaclotide (LINZESS) capsule 145 mcg (has no administration in time range)  hydrALAZINE (APRESOLINE) injection 10 mg (has no administration in time  range)  amLODipine (NORVASC) tablet 5 mg (5 mg Oral Given 10/24/18 2037)  clopidogrel (PLAVIX) tablet 75 mg (75 mg Oral Not Given 10/24/18 1936)  isosorbide mononitrate (IMDUR) 24 hr tablet 90 mg (90 mg Oral Given 10/24/18 2037)  losartan (COZAAR) tablet 50 mg (50 mg Oral Not Given 10/24/18 2039)  metoprolol tartrate (LOPRESSOR) tablet 50 mg (has no administration in time range)  nitroGLYCERIN (NITROSTAT) SL tablet 0.4 mg (0.4 mg Sublingual Given 10/24/18 1441)  morphine 4 MG/ML injection 4 mg (4 mg Intravenous Given 10/24/18 1537)    Note:  This document was prepared using Dragon voice recognition software and may include unintentional dictation errors.  Nanda Quinton, MD Emergency Medicine    Maxon Kresse, [facility], MD 10/24/18 [phone]

## 2018-10-24 NOTE — ED Triage Notes (Signed)
Patient complaining of chest pain with nausea and vomiting that started 2 days ago. Patient had a cardiac stent placement about four years ago. Patient took nitro prior to arrival without resolution.

## 2018-10-24 NOTE — Progress Notes (Signed)
Received from ED. Pt c/o c/p "elephant sitting on chest": radiates to left shoulder and left neck. C/O HA left side-: "shooting " pain with blurred vision. Neuro checks stable. At 1852: Dr. Denton Brick paged with order obtained for  EKG and administer Nitro as ordered. Continue to monitor

## 2018-10-24 NOTE — Telephone Encounter (Signed)
Patient called to say she has CP under left breast which radiated to left shoulder and neck. She aslo has SOB and feels "dead tired" Onset today but has had sx's intermittently for "a while".states she has taken 2 NTG and aspirin and is still having pain. She also reports left leg swelling and states she has pain in her calf.I directed her to go to the ED, she says family will take her.

## 2018-10-24 NOTE — Progress Notes (Signed)
Patients heart rate 57, BP 151/98. Dr. Denton Brick notified of patient's vitals. Instructed to give tonight's BP meds as ordered.

## 2018-10-24 NOTE — H&P (Addendum)
History and Physical    LORRAINA SPRING Wang:[id number] DOB: [birth year] DOA: 10/24/2018  PCP: [name], MD   Patient coming from: Home  I have personally briefly reviewed patient's old medical records in [place]  Chief Complaint: Chest Pain  HPI: Bethany Wang is a 53 y.o. female with medical history significant for CAD, hypertension, tobacco abuse, CVA, suicide attempt, prediabetes, right breast cancer ,  who presented to the ED with complaints of constant left-sided chest pain of 3-4 days.  Pain radiates down her left arm with numbness.  She describes pain as pressure-like.  Chest pain is associated with difficulty catching her breath, and is different from her prior heart attacks. she also reports some leg swelling over the past 3 to 4 days but this appears to have not improved today.  She tells me she has also had a blood clot in her legs for which was placed on warfarin for short duration.  She takes 2 baby aspirins twice a day.  ED Course: Blood pressure systolic 885O to 277A, heart rate 50s to 60.  O2 sats greater than 94% on room air.  High-sensitivity troponin x2 WNL.  Two-view chest x-ray without acute abnormality.  EKG sinus rhythm, no significant ST or T wave abnormalities compared to prior.  Hospitalist called to admit for chest pain rule out ACS.  Review of Systems: As per HPI all other systems reviewed and negative.  Past Medical History:  Diagnosis Date  . Anemia   . Anxiety   . Bilateral breast cysts 12/30/2015  . Breast cancer ([place])    Left Breast Cancer  . BV (bacterial vaginosis) 09/09/2015  . Coronary atherosclerosis of native coronary artery    a. s/p DES to RCA in 2013 b. DES to LAD in 2014 c. cath in 04/2016 showing patent LAD stent with D2 jailed by LAD stent and CTO of RCA with left to right collaterals present  . Cyst of pharynx or nasopharynx    [name]  . Depression   . Essential hypertension   . Falls    x 3-4 in  past year  . GERD (gastroesophageal reflux disease)   . History of breast cancer 09/09/2015  . History of hematuria   . Mixed hyperlipidemia   . Personal history of chemotherapy    Left Breast Cancer  . Personal history of radiation therapy    Left Breast Cancer  . Pre-diabetes   . PUD (peptic ulcer disease)   . Seizures ([place])   . Shingles 09/09/2015  . Stroke ([place])    12-2017 on Plavix, only deficit is headaches  . Suicide attempt [facility])    3 attempts in remote past  . Uterine cancer [facility])     Past Surgical History:  Procedure Laterality Date  . ABDOMINAL HYSTERECTOMY    . BLADDER SURGERY    . BREAST LUMPECTOMY Left   . BREAST LUMPECTOMY Right 02/24/2018   Procedure: RIGHT BREAST LUMPECTOMY ERAS PATHWAY;  Surgeon: Jovita Kussmaul, MD;  Location: [facility];  Service: General;  Laterality: Right;  . CARDIAC CATHETERIZATION N/A 05/10/2016   Procedure: Left Heart Cath and Coronary Angiography;  Surgeon: Belva Crome, MD;  Location: [place] CV LAB;  Service: Cardiovascular;  Laterality: N/A;  . COLONOSCOPY  May 2010   Fleishman: normal rectum, internal hemorrhoids, , benign colonic polyp  . COLONOSCOPY N/A 01/05/2017   Procedure: COLONOSCOPY;  Surgeon: Rogene Houston, MD;  [facility];  Service:  Endoscopy;  Laterality: N/A;  1:00  . ESOPHAGOGASTRODUODENOSCOPY     2001 Dr. Amedeo Plenty: distal esophagitis, small hiatal hernia,. Dr. Tamala Julian 2006? no records available currently, pt also reports  EGD a few years ago with Dr. Gala Romney, do not have these reports anywhere in medical records  . ESOPHAGOGASTRODUODENOSCOPY  06/02/2011   AUQ:JFHLKT pill impaction as described above s/p dilation of a probable cervical esophageal web/bx abnormal esophageal and gastric mucosa. + H.pylori gastritis   . EYE SURGERY     Removed glass  . HAND SURGERY Right   . Left breast lumpectomy     Benign  . LEFT HEART CATHETERIZATION WITH CORONARY ANGIOGRAM N/A 07/03/2014   Procedure: LEFT HEART CATHETERIZATION  WITH CORONARY ANGIOGRAM;  Surgeon: Belva Crome, MD;  Location: [facility] CATH LAB;  Service: Cardiovascular;  Laterality: N/A;  . POLYPECTOMY  01/05/2017   Procedure: POLYPECTOMY;  Surgeon: Rogene Houston, MD;  [facility];  Service: Endoscopy;;  colon  . SHOULDER SURGERY Left      reports that she quit smoking about 6 years ago. Her smoking use included cigarettes and cigars. She started smoking about 35 years ago. She has a 6.40 pack-year smoking history. She has never used smokeless tobacco. She reports that she does not drink alcohol or use drugs.  Allergies  Allergen Reactions  . Bee Venom Anaphylaxis and Other (See Comments)    "throat swelled and had to the hospital" - Yellow jacket sting  . Penicillins Anaphylaxis and Other (See Comments)    Has patient had a PCN reaction causing immediate rash, facial/tongue/throat swelling, SOB or lightheadedness with hypotension: Yes Has patient had a PCN reaction causing severe rash involving mucus membranes or skin necrosis: Yes Has patient had a PCN reaction that required hospitalization Yes Has patient had a PCN reaction occurring within the last 10 years: Yes If all of the above answers are "NO", then may proceed with Cephalosporin use. Patient states she carries an epi-pen  . Shellfish Allergy Swelling    Family History  Problem Relation Age of Onset  . Colon cancer Paternal Grandfather   . Cancer Father   . Cancer Maternal Uncle   . Alzheimer's disease Paternal Aunt   . Cirrhosis Maternal Uncle   . Cancer Paternal Aunt        lung  . Heart disease Brother   . Heart attack Brother   . Mental illness Daughter   . ADD / ADHD Daughter   . Bipolar disorder Daughter   . Mental illness Son   . ADD / ADHD Son   . Bipolar disorder Son   . Mental illness Son   . ADD / ADHD Son   . Bipolar disorder Son     Prior to Admission medications   Medication Sig Start Date End Date Taking? Authorizing Provider  ALPRAZolam Duanne Moron)  0.5 MG tablet Take 0.5 mg by mouth every morning.    Yes [provider]  aspirin EC 81 MG tablet Take 162 mg by mouth at bedtime.   Yes [provider]  EPINEPHrine 0.3 mg/0.3 mL IJ SOAJ injection Inject 0.3 mg into the muscle once.    Yes [provider]  ibuprofen (ADVIL) 200 MG tablet Take 200 mg by mouth every 6 (six) hours as needed for mild pain or moderate pain.   Yes [provider]  LINZESS 145 MCG CAPS capsule Take 145 mcg by mouth daily.  10/17/18  Yes [provider]  metoprolol tartrate (LOPRESSOR)  50 MG tablet Take 1.5 tablets (75 mg total) by mouth 2 (two) times daily. Patient taking differently: Take 100 mg by mouth 2 (two) times daily.  06/15/18  Yes Satira Sark, MD  naproxen sodium (ALEVE) 220 MG tablet Take 220 mg by mouth daily as needed (for pain).   Yes [provider]  NITROSTAT 0.4 MG SL tablet PLACE 1 TAB UNDER TONGUE EVERY 5 MIN UP TO 3 TIMES AS NEEDED FOR CHEST PAIN, IF NO RELIEF CALL 911. Patient taking differently: Place 0.4 mg under the tongue every 5 (five) minutes as needed for chest pain.  05/24/17  Yes Herminio Commons, MD  amLODipine (NORVASC) 5 MG tablet Take 5 mg by mouth daily.     [provider]  atorvastatin (LIPITOR) 40 MG tablet Take 1 tablet (40 mg total) by mouth daily at 6 PM. 06/16/16   Eber Jones, MD  clopidogrel (PLAVIX) 75 MG tablet Take 75 mg by mouth daily.  12/13/17   [provider]  escitalopram (LEXAPRO) 20 MG tablet Take 20 mg by mouth daily.  11/03/15   [provider]  isosorbide mononitrate (IMDUR) 30 MG 24 hr tablet Take 3 tablets (90 mg total) by mouth daily. 07/20/16   Satira Sark, MD  losartan (COZAAR) 50 MG tablet Take 50 mg by mouth daily.    [provider]  mometasone (ASMANEX, 30 METERED DOSES,) 220 MCG/INH inhaler Inhale 1 puff into the lungs 2 (two) times daily.    [provider]  pantoprazole (PROTONIX) 40 MG  tablet Take 40 mg by mouth daily.     [provider]  potassium chloride SA (K-DUR,KLOR-CON) 20 MEQ tablet Take 1 tablet (20 mEq total) by mouth daily. 06/17/16   Eber Jones, MD  thyroid (ARMOUR) 60 MG tablet Take 60 mg by mouth daily.  12/20/17   [provider]  topiramate (TOPAMAX) 25 MG tablet Take 25 mg by mouth 2 (two) times daily.  12/13/17   [provider]    Physical Exam: Vitals:   10/24/18 1615 10/24/18 1630 10/24/18 1700 10/24/18 1730  BP:  (!) 188/97 (!) 172/89 (!) 171/86  Pulse: (!) 51 (!) 59 (!) 56 60  Resp: 18 16 17  (!) 22  Temp:      TempSrc:      SpO2: 97% 99% 95% 91%    Constitutional: NAD, calm, comfortable Vitals:   10/24/18 1615 10/24/18 1630 10/24/18 1700 10/24/18 1730  BP:  (!) 188/97 (!) 172/89 (!) 171/86  Pulse: (!) 51 (!) 59 (!) 56 60  Resp: 18 16 17  (!) 22  Temp:      TempSrc:      SpO2: 97% 99% 95% 91%   Eyes: PERRL, lids and conjunctivae normal ENMT: Mucous membranes are moist. Posterior pharynx clear of any exudate or lesions. Neck: normal, supple, no masses, no thyromegaly Respiratory: clear to auscultation bilaterally, no wheezing, no crackles. Normal respiratory effort. No accessory muscle use.  Cardiovascular: Regular rate and rhythm, no murmurs / rubs / gallops. No extremity edema. 2+ pedal pulses.   Abdomen: no tenderness, no masses palpated. No hepatosplenomegaly. Bowel sounds positive.  Musculoskeletal: no clubbing / cyanosis. No joint deformity upper and lower extremities. Good ROM, no contractures. Normal muscle tone.  Skin: no rashes, lesions, ulcers. No induration Neurologic: CN 2-12 grossly intact.  Strength 5/5 in all 4.  Psychiatric: Normal judgment and insight. Alert and oriented x 3. Normal mood.   Labs on Admission: I  have personally reviewed following labs and imaging studies  CBC: Recent Labs  Lab 10/24/18 1241  WBC 7.3  HGB 13.6  HCT 42.4  MCV 85.1  PLT 825   Basic Metabolic  Panel: Recent Labs  Lab 10/24/18 1334  NA 138  K 4.1  CL 105  CO2 27  GLUCOSE 95  BUN 11  CREATININE 0.74  CALCIUM 9.0    Radiological Exams on Admission: Dg Chest 2 View  Result Date: 10/24/2018 CLINICAL DATA:  Chest pain EXAM: CHEST - 2 VIEW COMPARISON:  April 13, 2018 FINDINGS: The heart size and mediastinal contours are within normal limits. The aorta is tortuous. Both lungs are clear. The visualized skeletal structures are stable. IMPRESSION: No active cardiopulmonary disease. Electronically Signed   By: Abelardo Diesel M.D.   On: 10/24/2018 13:29    EKG: Independently reviewed.  Sinus rhythm, rate 70 bpm.  QTc 436.  No significant ST or T wave abnormalities compared to prior EKG.  Assessment/Plan Active Problems:   Chest pain    Chest pain with CAD history- atypical, but high risk for ACS with history of coronary artery disease.  No improvement with nitroglycerin.  Multivessel CAD status post DES to the RCA in 2013, DES to the LAD in 2014.  Last cardiac cath 2018-  demonstrating patent LAD stent site with 75% second diagonal, 75-80% small obtuse marginal stenosis, and occluded RCA at previous stent site associated with left-to-right collaterals.  Medical therapy was planned.  She also reports a history of DVT in lower extremity.  She tells me she is on aspirin 162 mg twice daily and Plavix and compliant. -Echocardiogram -D-dimer -EKG a.m. -Continue home aspirin, Plavix, statins, Imdur, metoprolol -Would benefit from better blood pressure control. -UDS -Cardiology consult -PRN nitro, morphine -Addendum-on arrival to floor patient started complaining of worsening left-sided chest pain, headaches and left eye blurred vision-patient reports she has had prior episodes in the recent past.  Repeat Stat EKG showed T wave changes in V2 V3.  I talked to cardiology, Dr. Marlou Porch on-call at [facility], who recommended that patient might benefit from cardiac cath, but at this time her troponin is  reassuring considering her duration of symptoms, we agreed to get a repeat high-sensitivity troponin, if elevated transfer to [facility].  Otherwise patient can be evaluated in a.m. by cardiology team here at [facility].  Also recommended continuing and optimizing medical therapy.  Hx of TIA -no residual deficits.  Reports of headache and left eye blurred vision.  Patient reports prior episodes of intermittent left eye blurry vision in the recent past. -Continue antiplatelet, statins -Head CT negative for acute abnormality.  Hypertension-elevated.   -Continue home metoprolol 100 BID, norvasc 5mg , lorsartan and imdur -PRN hydralazine  Breast cancer-status post right breast lumpectomy 2019, chemo and radiation to left breast.  DVT prophylaxis: Lovenox Code Status: Full Family Communication: None at bedside Disposition Plan: 1 to 2 days Consults called: Cardiology Admission status: Observation, telemetry   Bethena Roys MD [facility]  10/24/2018, 6:43 PM

## 2018-10-24 NOTE — Progress Notes (Signed)
Pt c/o c/p and left eye blurriness/HA unchanged post three sl nitro tabs. Dr. Denton Brick called with order to give morphine 2 mg as order (IV hard to flush-pending new IV) and order pending  for CT of head. Pt state this is " not unusual" for her to have c/p and left eye blurriness/HA. She is "use to it". RN asked what brought her in today? Pt stated  leg swelling.  No leg  swelling noted on admission assessment. Pt self report of leg swelling given to Dr. Denton Brick. Continue to monitor.

## 2018-10-25 ENCOUNTER — Observation Stay (HOSPITAL_BASED_OUTPATIENT_CLINIC_OR_DEPARTMENT_OTHER): Payer: Medicare Other

## 2018-10-25 ENCOUNTER — Encounter (HOSPITAL_COMMUNITY)
Admission: EM | Disposition: A | Discharge status: Home / Self Care | Payer: Self-pay | Source: Home / Self Care | Attending: Emergency Medicine

## 2018-10-25 ENCOUNTER — Encounter (HOSPITAL_COMMUNITY): Payer: Self-pay

## 2018-10-25 DIAGNOSIS — E782 Mixed hyperlipidemia: Secondary | ICD-10-CM | POA: Diagnosis not present

## 2018-10-25 DIAGNOSIS — E785 Hyperlipidemia, unspecified: Secondary | ICD-10-CM | POA: Diagnosis not present

## 2018-10-25 DIAGNOSIS — I10 Essential (primary) hypertension: Secondary | ICD-10-CM

## 2018-10-25 DIAGNOSIS — I25118 Atherosclerotic heart disease of native coronary artery with other forms of angina pectoris: Secondary | ICD-10-CM | POA: Diagnosis not present

## 2018-10-25 DIAGNOSIS — I200 Unstable angina: Secondary | ICD-10-CM

## 2018-10-25 DIAGNOSIS — Z955 Presence of coronary angioplasty implant and graft: Secondary | ICD-10-CM | POA: Diagnosis not present

## 2018-10-25 DIAGNOSIS — I351 Nonrheumatic aortic (valve) insufficiency: Secondary | ICD-10-CM | POA: Diagnosis not present

## 2018-10-25 DIAGNOSIS — I209 Angina pectoris, unspecified: Secondary | ICD-10-CM | POA: Diagnosis not present

## 2018-10-25 DIAGNOSIS — I25110 Atherosclerotic heart disease of native coronary artery with unstable angina pectoris: Secondary | ICD-10-CM

## 2018-10-25 DIAGNOSIS — I471 Supraventricular tachycardia: Secondary | ICD-10-CM

## 2018-10-25 DIAGNOSIS — Z8673 Personal history of transient ischemic attack (TIA), and cerebral infarction without residual deficits: Secondary | ICD-10-CM

## 2018-10-25 HISTORY — PX: LEFT HEART CATH AND CORONARY ANGIOGRAPHY: CATH118249

## 2018-10-25 LAB — GLUCOSE, CAPILLARY
Glucose-Capillary: 86 mg/dL (ref 70–99)
Glucose-Capillary: 78 mg/dL (ref 70–99)

## 2018-10-25 LAB — ECHOCARDIOGRAM COMPLETE
Height: 64 in
Weight: 2504 oz

## 2018-10-25 LAB — HIV ANTIBODY (ROUTINE TESTING W REFLEX): HIV Screen 4th Generation wRfx: NONREACTIVE

## 2018-10-25 SURGERY — LEFT HEART CATH AND CORONARY ANGIOGRAPHY
Anesthesia: LOCAL

## 2018-10-25 MED ORDER — SODIUM CHLORIDE 0.9 % WEIGHT BASED INFUSION: 3.0000 mL/kg/h | INTRAVENOUS | Status: DC

## 2018-10-25 MED ORDER — FENTANYL CITRATE (PF) 100 MCG/2ML IJ SOLN
INTRAMUSCULAR | Status: AC
  Filled 2018-10-25: qty 2

## 2018-10-25 MED ORDER — SODIUM CHLORIDE 0.9% FLUSH: 3.0000 mL | Freq: Two times a day (BID) | INTRAVENOUS | Status: DC

## 2018-10-25 MED ORDER — VERAPAMIL HCL 2.5 MG/ML IV SOLN
INTRAVENOUS | Status: DC | PRN
  Administered 2018-10-25: 10 mL via INTRA_ARTERIAL

## 2018-10-25 MED ORDER — LIDOCAINE HCL (PF) 1 % IJ SOLN
INTRAMUSCULAR | Status: AC
  Filled 2018-10-25: qty 30

## 2018-10-25 MED ORDER — MIDAZOLAM HCL 2 MG/2ML IJ SOLN
INTRAMUSCULAR | Status: DC | PRN
  Administered 2018-10-25 (×2): 1 mg via INTRAVENOUS

## 2018-10-25 MED ORDER — VERAPAMIL HCL 2.5 MG/ML IV SOLN
INTRAVENOUS | Status: AC
  Filled 2018-10-25: qty 2

## 2018-10-25 MED ORDER — FENTANYL CITRATE (PF) 100 MCG/2ML IJ SOLN
INTRAMUSCULAR | Status: DC | PRN
  Administered 2018-10-25 (×2): 25 ug via INTRAVENOUS

## 2018-10-25 MED ORDER — HEPARIN SODIUM (PORCINE) 1000 UNIT/ML IJ SOLN
INTRAMUSCULAR | Status: DC | PRN
  Administered 2018-10-25: 3500 [IU] via INTRAVENOUS

## 2018-10-25 MED ORDER — IOHEXOL 350 MG/ML SOLN
INTRAVENOUS | Status: DC | PRN
  Administered 2018-10-25: 45 mL via INTRACARDIAC

## 2018-10-25 MED ORDER — NITROGLYCERIN 0.4 MG SL SUBL
SUBLINGUAL_TABLET | SUBLINGUAL | Status: AC
  Filled 2018-10-25: qty 1

## 2018-10-25 MED ORDER — HEPARIN (PORCINE) IN NACL 1000-0.9 UT/500ML-% IV SOLN
INTRAVENOUS | Status: AC
  Filled 2018-10-25: qty 1000

## 2018-10-25 MED ORDER — SODIUM CHLORIDE 0.9 % WEIGHT BASED INFUSION
1.0000 mL/kg/h | INTRAVENOUS | Status: DC
  Administered 2018-10-25: 1 mL/kg/h via INTRAVENOUS

## 2018-10-25 MED ORDER — MIDAZOLAM HCL 2 MG/2ML IJ SOLN
INTRAMUSCULAR | Status: AC
  Filled 2018-10-25: qty 2

## 2018-10-25 MED ORDER — METOPROLOL TARTRATE 25 MG PO TABS
25.0000 mg | ORAL_TABLET | Freq: Two times a day (BID) | ORAL | Status: DC
  Administered 2018-10-25 – 2018-10-26 (×2): 25 mg via ORAL
  Filled 2018-10-25 (×2): qty 1

## 2018-10-25 MED ORDER — HYDRALAZINE HCL 20 MG/ML IJ SOLN: 10.0000 mg | INTRAMUSCULAR | Status: AC | PRN

## 2018-10-25 MED ORDER — HEPARIN SODIUM (PORCINE) 1000 UNIT/ML IJ SOLN
INTRAMUSCULAR | Status: AC
  Filled 2018-10-25: qty 1

## 2018-10-25 MED ORDER — SODIUM CHLORIDE 0.9% FLUSH: 3.0000 mL | INTRAVENOUS | Status: DC | PRN

## 2018-10-25 MED ORDER — HEPARIN (PORCINE) IN NACL 1000-0.9 UT/500ML-% IV SOLN
INTRAVENOUS | Status: DC | PRN
  Administered 2018-10-25 (×2): 500 mL

## 2018-10-25 MED ORDER — SODIUM CHLORIDE 0.9 % IV SOLN: 250.0000 mL | INTRAVENOUS | Status: DC | PRN

## 2018-10-25 MED ORDER — LIDOCAINE HCL (PF) 1 % IJ SOLN
INTRAMUSCULAR | Status: DC | PRN
  Administered 2018-10-25: 2 mL

## 2018-10-25 MED ORDER — SODIUM CHLORIDE 0.9 % IV SOLN
INTRAVENOUS | Status: AC
  Administered 2018-10-25: 15:00:00 via INTRAVENOUS

## 2018-10-25 SURGICAL SUPPLY — 11 items
CATH INFINITI 5FR JK (CATHETERS) ×1 IMPLANT
COVER DOME SNAP 22 D (MISCELLANEOUS) ×1 IMPLANT
DEVICE RAD COMP TR BAND LRG (VASCULAR PRODUCTS) ×1 IMPLANT
GUIDEWIRE INQWIRE 1.5J.035X260 (WIRE) IMPLANT
INQWIRE 1.5J .035X260CM (WIRE) ×2
KIT HEART LEFT (KITS) ×2 IMPLANT
PACK CARDIAC CATHETERIZATION (CUSTOM PROCEDURE TRAY) ×2 IMPLANT
SHEATH PROBE COVER 6X72 (BAG) ×1 IMPLANT
SHEATH RAIN RADIAL 21G 6FR (SHEATH) ×2 IMPLANT
TRANSDUCER W/STOPCOCK (MISCELLANEOUS) ×2 IMPLANT
TUBING CIL FLEX 10 FLL-RA (TUBING) ×2 IMPLANT

## 2018-10-25 NOTE — Progress Notes (Signed)
Report given to [facility] staff. Vitals obtained, WNL. Pt asymptomatic currently. IV patent and intact. Pt ready for transport when [facility] arrives.

## 2018-10-25 NOTE — Progress Notes (Signed)
Pt c/o chest pain rating around 7/10. Morphine given to pt as Nitro was out in [employer]. Checked in with patient 5 minutes after giving Morphine, stated she felt relief and didn't need the Nitro. Will continue to monitor.

## 2018-10-25 NOTE — H&P (View-Only) (Signed)
Cardiology Consult    Patient ID: Bethany Wang; [id number]; 01-Aug-1965   Admit date: 10/24/2018 Date of Consult: 10/25/2018  Primary Care Provider: [name], MD Primary Cardiologist: Bethany Lesches, MD   Patient Profile    Bethany Wang is a 53 y.o. female with past medical history of CAD (s/p DES to RCA in 2013, DES to LAD in 2014, and catheterization in 2018 showing patent LAD stent with D2 jailed by the stent, totally occluded RCA with L-->R collaterals, 75-80% small caliber OM stenosis and patent LM with medical management recommended with consideration of CTO PCI to RCA or PCI to small OM if refractory symptoms), HTN, HLD, palpitations, prior TIA (occurring in 12/2017), and breast cancer (s/p lumpectomy) who is being seen today for the evaluation of chest pain at the request of Dr. Denton Wang.   History of Present Illness    Bethany Wang was last examined by Dr. Domenic Wang in 03/2018 and reported occasional episodes of intermittent angina along with frequent palpitations. A Holter monitor was obtained and showed PAC's and PVC's with occasional episodes of SVT and ventricular bigeminy. There were no sustained arrhythmias and Lopressor was further titrated to 75 mg twice daily.  Bethany Wang called the office on 10/24/2018 reporting worsening chest discomfort, fatigue, and dyspnea. ED evaluation was recommended. Bethany Wang reports having progressive fatigue for the past several weeks but over the past 3-4 days reports very little energy and has started to develop associated chest discomfort and diaphoresis. Says Bethany Wang had been walking 4.0 - 4.5 miles daily without symptoms but starting earlier this week, Bethany Wang started to develop chest discomfort and diaphoresis with this. Yesterday, Bethany Wang experienced symptoms even when walking down her driveway and felt a pressure along her entire chest. Took ASA with some improvement in her symptoms but still has discomfort and left arm pain which prompted her to come to  the ED. Resembles her prior angina but reports symptoms are now more intense. Denies any recent orthopnea or PND. Bethany Wang does experience intermittent lower extremity edema and reports worsening symptoms over the past few days as well.   Initial labs showed WBC 7.3, Hgb 13.6, platelets 266, Na+ 138, K+ 4.1, and creatinine 0.74. Initial and delta HS Troponin values have been negative at 3.00, 3.00, and 5.00.  COVID negative. D-dimer negative. UDS positive for opiates. CXR shows no active cardiopulmonary disease. CT Head with no acute findings. EKG shows sinus bradycardia, HR 52, with LVH and TWI along the anterior leads which is more pronounced when compared to prior tracings. Not as prominent on repeat tracing this morning.    Past Medical History:  Diagnosis Date  . Anemia   . Anxiety   . Bilateral breast cysts 12/30/2015  . Breast cancer ([place])    Left Breast Cancer  . BV (bacterial vaginosis) 09/09/2015  . Coronary atherosclerosis of native coronary artery    a. s/p DES to RCA in 2013 b. DES to LAD in 2014 c. cath in 04/2016 showing patent LAD stent with D2 jailed by LAD stent and CTO of RCA with left to right collaterals present  . Cyst of pharynx or nasopharynx    [name]  . Depression   . Essential hypertension   . Falls    x 3-4 in past year  . GERD (gastroesophageal reflux disease)   . History of breast cancer 09/09/2015  . History of hematuria   . Mixed hyperlipidemia   . Personal history of chemotherapy  Left Breast Cancer  . Personal history of radiation therapy    Left Breast Cancer  . Pre-diabetes   . PUD (peptic ulcer disease)   . Seizures ([place])   . Shingles 09/09/2015  . Stroke ([place])    12-2017 on Plavix, only deficit is headaches  . Suicide attempt [facility])    3 attempts in remote past  . Uterine cancer [facility])     Past Surgical History:  Procedure Laterality Date  . ABDOMINAL HYSTERECTOMY    . BLADDER SURGERY    . BREAST LUMPECTOMY Left   .  BREAST LUMPECTOMY Right 02/24/2018   Procedure: RIGHT BREAST LUMPECTOMY ERAS PATHWAY;  Surgeon: Jovita Kussmaul, MD;  Location: [place];  Service: General;  Laterality: Right;  . CARDIAC CATHETERIZATION N/A 05/10/2016   Procedure: Left Heart Cath and Coronary Angiography;  Surgeon: Belva Crome, MD;  Location: [place] CV LAB;  Service: Cardiovascular;  Laterality: N/A;  . COLONOSCOPY  May 2010   Fleishman: normal rectum, internal hemorrhoids, , benign colonic polyp  . COLONOSCOPY N/A 01/05/2017   Procedure: COLONOSCOPY;  Surgeon: Rogene Houston, MD;  [facility];  Service: Endoscopy;  Laterality: N/A;  1:00  . ESOPHAGOGASTRODUODENOSCOPY     2001 Dr. Amedeo Plenty: distal esophagitis, small hiatal hernia,. Dr. Tamala Julian 2006? no records available currently, pt also reports  EGD a few years ago with Dr. Gala Romney, do not have these reports anywhere in medical records  . ESOPHAGOGASTRODUODENOSCOPY  06/02/2011   SWF:UXNATF pill impaction as described above s/p dilation of a probable cervical esophageal web/bx abnormal esophageal and gastric mucosa. + H.pylori gastritis   . EYE SURGERY     Removed glass  . HAND SURGERY Right   . Left breast lumpectomy     Benign  . LEFT HEART CATHETERIZATION WITH CORONARY ANGIOGRAM N/A 07/03/2014   Procedure: LEFT HEART CATHETERIZATION WITH CORONARY ANGIOGRAM;  Surgeon: Belva Crome, MD;  Location: [facility] CATH LAB;  Service: Cardiovascular;  Laterality: N/A;  . POLYPECTOMY  01/05/2017   Procedure: POLYPECTOMY;  Surgeon: Rogene Houston, MD;  [facility];  Service: Endoscopy;;  colon  . SHOULDER SURGERY Left      Home Medications:  Prior to Admission medications   Medication Sig Start Date End Date Taking? Authorizing Provider  ALPRAZolam Duanne Moron) 0.5 MG tablet Take 0.5 mg by mouth every morning.    Yes [provider]  aspirin EC 81 MG tablet Take 162 mg by mouth at bedtime.   Yes [provider]  EPINEPHrine 0.3 mg/0.3 mL IJ SOAJ  injection Inject 0.3 mg into the muscle once.    Yes [provider]  ibuprofen (ADVIL) 200 MG tablet Take 200 mg by mouth every 6 (six) hours as needed for mild pain or moderate pain.   Yes [provider]  LINZESS 145 MCG CAPS capsule Take 145 mcg by mouth daily.  10/17/18  Yes [provider]  metoprolol tartrate (LOPRESSOR) 50 MG tablet Take 1.5 tablets (75 mg total) by mouth 2 (two) times daily. Patient taking differently: Take 100 mg by mouth 2 (two) times daily.  06/15/18  Yes Satira Sark, MD  naproxen sodium (ALEVE) 220 MG tablet Take 220 mg by mouth daily as needed (for pain).   Yes [provider]  NITROSTAT 0.4 MG SL tablet PLACE 1 TAB UNDER TONGUE EVERY 5 MIN UP TO 3 TIMES AS NEEDED FOR CHEST PAIN, IF NO RELIEF CALL 911. Patient taking differently: Place 0.4 mg  under the tongue every 5 (five) minutes as needed for chest pain.  05/24/17  Yes Herminio Commons, MD  amLODipine (NORVASC) 5 MG tablet Take 5 mg by mouth daily.     [provider]  atorvastatin (LIPITOR) 40 MG tablet Take 1 tablet (40 mg total) by mouth daily at 6 PM. 06/16/16   Eber Jones, MD  clopidogrel (PLAVIX) 75 MG tablet Take 75 mg by mouth daily.  12/13/17   [provider]  escitalopram (LEXAPRO) 20 MG tablet Take 20 mg by mouth daily.  11/03/15   [provider]  isosorbide mononitrate (IMDUR) 30 MG 24 hr tablet Take 3 tablets (90 mg total) by mouth daily. 07/20/16   Satira Sark, MD  losartan (COZAAR) 50 MG tablet Take 50 mg by mouth daily.    [provider]  mometasone (ASMANEX, 30 METERED DOSES,) 220 MCG/INH inhaler Inhale 1 puff into the lungs 2 (two) times daily.    [provider]  potassium chloride SA (K-DUR,KLOR-CON) 20 MEQ tablet Take 1 tablet (20 mEq total) by mouth daily. 06/17/16   Eber Jones, MD  thyroid (ARMOUR) 60 MG tablet Take 60 mg by mouth daily.  12/20/17   [provider]  topiramate  (TOPAMAX) 25 MG tablet Take 25 mg by mouth 2 (two) times daily.  12/13/17   [provider]    Inpatient Medications: Scheduled Meds: . ALPRAZolam  0.5 mg Oral q morning - 10a  . amLODipine  5 mg Oral Daily  . aspirin EC  162 mg Oral QHS  . atorvastatin  40 mg Oral q1800  . clopidogrel  75 mg Oral Daily  . enoxaparin (LOVENOX) injection  40 mg Subcutaneous Q24H  . isosorbide mononitrate  90 mg Oral Daily  . linaclotide  145 mcg Oral Daily  . losartan  50 mg Oral Daily  . metoprolol tartrate  50 mg Oral BID   Continuous Infusions:  PRN Meds: acetaminophen **OR** acetaminophen, hydrALAZINE, morphine injection, nitroGLYCERIN, ondansetron **OR** ondansetron (ZOFRAN) IV, polyethylene glycol  Allergies:    Allergies  Allergen Reactions  . Bee Venom Anaphylaxis and Other (See Comments)    "throat swelled and had to the hospital" - Yellow jacket sting  . Penicillins Anaphylaxis and Other (See Comments)    Has patient had a PCN reaction causing immediate rash, facial/tongue/throat swelling, SOB or lightheadedness with hypotension: Yes Has patient had a PCN reaction causing severe rash involving mucus membranes or skin necrosis: Yes Has patient had a PCN reaction that required hospitalization Yes Has patient had a PCN reaction occurring within the last 10 years: Yes If all of the above answers are "NO", then may proceed with Cephalosporin use. Patient states Bethany Wang carries an epi-pen  . Shellfish Allergy Swelling    Social History:   Social History   Socioeconomic History  . Marital status: Married    Spouse name: Not on file  . Number of children: 3  . Years of education: Assoc  . Highest education level: Not on file  Occupational History  . Occupation: Retired    Comment: disability  Social Needs  . Financial resource strain: Not on file  . Food insecurity    Worry: Not on file    Inability: Not on file  . Transportation needs    Medical: Not on file    Non-medical:  Not on file  Tobacco Use  . Smoking status: Former Smoker    Packs/day: 0.20    Years: 32.00  Pack years: 6.40    Types: Cigarettes, Cigars    Start date: 07/05/1983    Quit date: 04/03/2012    Years since quitting: 6.5  . Smokeless tobacco: Never Used  Substance and Sexual Activity  . Alcohol use: No    Alcohol/week: 0.0 standard drinks  . Drug use: No  . Sexual activity: Yes    Birth control/protection: Surgical    Comment: hyst  Lifestyle  . Physical activity    Days per week: Not on file    Minutes per session: Not on file  . Stress: Not on file  Relationships  . Social Herbalist on phone: Not on file    Gets together: Not on file    Attends religious service: Not on file    Active member of club or organization: Not on file    Attends meetings of clubs or organizations: Not on file    Relationship status: Not on file  . Intimate partner violence    Fear of current or ex partner: Not on file    Emotionally abused: Not on file    Physically abused: Not on file    Forced sexual activity: Not on file  Other Topics Concern  . Not on file  Social History Narrative   Patient lives at home with her fiance.   Caffeine Use: 3 cups daily     Family History:    Family History  Problem Relation Age of Onset  . Colon cancer Paternal Grandfather   . Cancer Father   . Cancer Maternal Uncle   . Alzheimer's disease Paternal Aunt   . Cirrhosis Maternal Uncle   . Cancer Paternal Aunt        lung  . Heart disease Brother   . Heart attack Brother   . Mental illness Daughter   . ADD / ADHD Daughter   . Bipolar disorder Daughter   . Mental illness Son   . ADD / ADHD Son   . Bipolar disorder Son   . Mental illness Son   . ADD / ADHD Son   . Bipolar disorder Son       Review of Systems    General:  No chills, fever, night sweats or weight changes. Positive for fatigue.  Cardiovascular:  No edema, orthopnea, palpitations, paroxysmal nocturnal dyspnea.  Positive for chest pain and dyspnea on exertion.  Dermatological: No rash, lesions/masses Respiratory: No cough, dyspnea Urologic: No hematuria, dysuria Abdominal:   No nausea, vomiting, diarrhea, bright red blood per rectum, melena, or hematemesis Neurologic:  No visual changes, wkns, changes in mental status. All other systems reviewed and are otherwise negative except as noted above.  Physical Exam/Data    Vitals:   10/24/18 2059 10/24/18 2120 10/24/18 2217 10/25/18 0616  BP:  118/77 125/81 99/72  Pulse:  (!) 59 (!) 57 (!) 52  Resp:  20  17  Temp:  98.2 F (36.8 C)  (!) 97.4 F (36.3 C)  TempSrc:  Oral  Oral  SpO2: 96% 98%  96%  Weight:      Height:        Intake/Output Summary (Last 24 hours) at 10/25/2018 0750 Last data filed at 10/25/2018 0000 Gross per 24 hour  Intake 240 ml  Output 400 ml  Net -160 ml   Filed Weights   10/24/18 1842  Weight: 71 kg   Body mass index is 26.86 kg/m.   General: Pleasant, female appearing in NAD Psych: Normal affect. Neuro:  Alert and oriented X 3. Moves all extremities spontaneously. HEENT: Normal  Neck: Supple without bruits or JVD. Lungs:  Resp regular and unlabored, CTA without wheezing or rales. Heart: RRR no s3, s4, or murmurs. Abdomen: Soft, non-tender, non-distended, BS + x 4.  Extremities: No clubbing, cyanosis or lower extremity edema. DP/PT/Radials 2+ and equal bilaterally.   EKG:  The EKG was personally reviewed and demonstrates: Sinus bradycardia, HR 52, with LVH and TWI along the anterior leads which is more pronounced when compared to prior tracings. Not as prominent on repeat tracing this morning.   Telemetry:  Telemetry was personally reviewed and demonstrates: Sinus bradycardia, HR in high-40's to 60's. Occasional PVC's.    Labs/Studies     Relevant CV Studies:  Cardiac Catheterization: 04/2016  Widely patent LAD stent. Ostial 75% second diagonal jailed by LAD stent.  Totally occluded right coronary  within the previously placed stent. The right fills by left to right collaterals.  Progression of first obtuse marginal from 30% to 75-80%. The vessel caliber is in the 1.5-2 mm range and is probably best treated with medical therapy.  Widely patent left main  Normal left ventricular function.EF estimated to be 60%. EDP is 14.  RECOMMENDATIONS:  Suspect angina is coming either from the first obtuse marginal, the second diagonal which is jailed, or from the collateralized right coronary.  Intensify medical therapy. ? Add Ranexa.  Intensify risk factor modification including LDL cholesterol less than 70.  Consider CTO PCI of the RCA if symptoms become refractory.  Consider PCI on the small to moderate size first obtuse marginal, however this vessel would possibly be oversized by our smallest stent.  Holter Monitor: 05/2018 48-hour Holter monitor reviewed.  Sinus rhythm was present throughout.  Heart rate ranged from 53 bpm up to 122 bpm with average heart rate 82 bpm.  Rare atrial and ventricular ectopy noted, mostly PACs and PVCs.  There was a single brief burst of SVT lasting 5 beats.  Also ventricular bigeminy noted.  Importantly, there were no sustained arrhythmias or pauses.   Laboratory Data:  Chemistry Recent Labs  Lab 10/24/18 1334  NA 138  K 4.1  CL 105  CO2 27  GLUCOSE 95  BUN 11  CREATININE 0.74  CALCIUM 9.0  GFRNONAA >60  GFRAA >60  ANIONGAP 6    No results for input(s): PROT, ALBUMIN, AST, ALT, ALKPHOS, BILITOT in the last 168 hours. Hematology Recent Labs  Lab 10/24/18 1241  WBC 7.3  RBC 4.98  HGB 13.6  HCT 42.4  MCV 85.1  MCH 27.3  MCHC 32.1  RDW 14.2  PLT 266   Cardiac EnzymesNo results for input(s): TROPONINI in the last 168 hours. No results for input(s): TROPIPOC in the last 168 hours.  BNPNo results for input(s): BNP, PROBNP in the last 168 hours.  DDimer  Recent Labs  Lab 10/24/18 1759  DDIMER 0.39    Radiology/Studies:  Dg Chest  2 View  Result Date: 10/24/2018 CLINICAL DATA:  Chest pain EXAM: CHEST - 2 VIEW COMPARISON:  April 13, 2018 FINDINGS: The heart size and mediastinal contours are within normal limits. The aorta is tortuous. Both lungs are clear. The visualized skeletal structures are stable. IMPRESSION: No active cardiopulmonary disease. Electronically Signed   By: Abelardo Diesel M.D.   On: 10/24/2018 13:29   Ct Head Wo Contrast  Result Date: 10/24/2018 CLINICAL DATA:  53 y/o  F; left eye blurriness and headache. EXAM: CT HEAD WITHOUT CONTRAST TECHNIQUE: Contiguous axial  images were obtained from the base of the skull through the vertex without intravenous contrast. COMPARISON:  12/11/2017 CT head. FINDINGS: Brain: No evidence of acute infarction, hemorrhage, hydrocephalus, extra-axial collection or mass lesion/mass effect. Vascular: Calcific atherosclerosis of the internal carotid arteries. Skull: Normal. Negative for fracture or focal lesion. Sinuses/Orbits: No acute finding. Other: None. IMPRESSION: Stable negative CT of the head. Electronically Signed   By: Kristine Garbe M.D.   On: 10/24/2018 21:47     Assessment & Plan    1. Chest Pain Concerning for Accelerating Angina - Bethany Wang was previously walking 4+ miles per day without symptoms but starting earlier this week, developed associated chest pressure and diaphoresis with this. Bethany Wang reports now having symptoms when walking along her driveway and radiating down her left arm.  - Initial and delta HS Troponin values have been negative at 3.00, 3.00, and 5.00. COVID negative. D-dimer negative. UDS positive for opiates. EKG shows sinus bradycardia, HR 52, with LVH and TWI along the anterior leads which is more pronounced when compared to prior tracings. Not as prominent on repeat tracing this morning.  - her overall presentation is concerning for accelerating angina. Will review with Dr. Bronson Ing but would anticipate a repeat cardiac catheterization for  definitive evaluation. The patient understands that risks include but are not limited to stroke (1 in 1000), death (1 in 40), kidney failure [usually temporary] (1 in 500), bleeding (1 in 200), allergic reaction [possibly serious] (1 in 200).   - Bethany Wang is pain-free at this time. If symptoms represent, would start Heparin. Continue ASA, Plavix, BB, and Imdur.   2. CAD - s/p DES to RCA in 2013, DES to LAD in 2014, and catheterization in 2018 showing patent LAD stent with D2 jailed by the stent, totally occluded RCA with L-->R collaterals, 75-80% small caliber OM stenosis and patent LM with medical management recommended with consideration of CTO PCI to RCA or PCI to small OM if refractory symptoms. - plan for repeat ischemic evaluation as outlined above.  - remains on ASA (162mg  daily per Neurology), Plavix, Imdur, BB, and statin therapy.  3. Palpitations - prior monitor showed PAC's and PVC's with occasional episodes of SVT and ventricular bigeminy. No significant arrhythmias by review of telemetry thus far but Bethany Wang has been bradycardiac with HR in the mid-40's at times. Agree with reducing PTA Lopressor 75mg  BID to 50mg  BID. Continue to follow on telemetry.   4. HTN - BP has been variable at 99/72 - 188/114 within the past 24 hours. Continue to follow with administration of AM medications.  - Bethany Wang has been continued on PTA Amlodipine, Losartan, Imdur and Lopressor.   5. HLD - FLP in 12/2017 showed total cholesterol of 193, HDL 52, and LDL 122. Not at goal of less than 70. Will recheck FLP. Bethany Wang has been continued on PTA Atorvastatin 40mg  daily.   6. History of TIA - Bethany Wang initially told me this happened 2 months ago while hospitalized at [facility] but by review of records occurred in 12/2017. Head CT this admission shows no acute findings.   For questions or updates, please contact [place] Please consult [email or web address] for contact info under Cardiology/STEMI.  Signed, Erma Heritage,  PA-C 10/25/2018, 7:50 AM Pager: [phone]  The patient was seen and examined, and I agree with the history, physical exam, assessment and plan as documented above, with modifications as noted below. I have also personally reviewed all relevant documentation, old records, labs, and both radiographic and cardiovascular studies.  I have also independently interpreted old and new ECG's.  This is a very pleasant 53 year old woman with a history of coronary disease with most recent cardiac catheterization from 2018 reviewed above with jailing of the second diagonal by previously placed LAD stent, occluded RCA with left-to-right collaterals, and 75 to 80% stenosis in a small caliber obtuse marginal branch felt best to be treated with medical therapy as per Dr. Tamala Julian.  Bethany Wang has been having increasing exertional chest pain and shortness of breath over the past 2 weeks.  This past Saturday, Bethany Wang was unable to walk to her mailbox.  Bethany Wang has also been profusely sweating which is unusual for her.  Bethany Wang had been walking 4 to 4-1/2 miles daily.  Bethany Wang has been eating healthy.  Bethany Wang quit smoking for 5 years ago.  ECG yesterday demonstrated more pronounced precordial T wave inversions when compared to today.  Labs reviewed above with normal troponins.  Overall presentation consistent with accelerating angina.  I feel Bethany Wang would be best served with coronary angiography.  Continue aspirin, Plavix, beta-blocker, statin, and Imdur.  Due to low normal blood pressure, losartan will be held this morning.  We will transfer to [facility] for coronary angiography.   Kate Sable, MD, [facility]  10/25/2018 10:16 AM

## 2018-10-25 NOTE — Plan of Care (Signed)
  Problem: Clinical Measurements: Goal: Cardiovascular complication will be avoided Outcome: Progressing   Problem: Activity: Goal: Risk for activity intolerance will decrease Outcome: Progressing   Problem: Coping: Goal: Level of anxiety will decrease Outcome: Progressing   Problem: Pain Managment: Goal: General experience of comfort will improve Outcome: Progressing   Problem: Safety: Goal: Ability to remain free from injury will improve Outcome: Progressing   Problem: Skin Integrity: Goal: Risk for impaired skin integrity will decrease Outcome: Progressing   

## 2018-10-25 NOTE — TOC Initial Note (Signed)
Transition of Care [facility]) - Initial/Assessment Note    Patient Details  Name: Bethany Wang MRN: [id number] Date of Birth: 04-26-1965  Transition of [facility]) CM/SW Contact:    Candie Chroman, LCSW Phone Number: 10/25/2018, 4:19 PM  Clinical Narrative:  Patient has two CAPs nurses that come to her home. One is there from 9:00 am-2:45 pm and the other 12:00 pm-6:15 pm. She already has a shower chair, walker, and cane at home. CSW will continue to follow for needs.                Expected Discharge Plan: [place] Barriers to Discharge: Continued Medical Work up   Patient Goals and CMS Choice        Expected Discharge Plan and Services Expected Discharge Plan: [place] Choice: Resumption of Svcs/PTA Provider Living arrangements for the past 2 months: Single Family Home                                      Prior Living Arrangements/Services Living arrangements for the past 2 months: Single Family Home Lives with:: Spouse Patient language and need for interpreter reviewed:: Yes Do you feel safe going back to the place where you live?: Yes      Need for Family Participation in Patient Care: Yes (Comment) Care giver support system in place?: Yes (comment) Current home services: Other (comment), DME(CAP's nurses) Criminal Activity/Legal Involvement Pertinent to Current Situation/Hospitalization: No - Comment as needed  Activities of Daily Living Home Assistive Devices/Equipment: Cane (specify quad or straight), Shower chair with back, Grab bars around toilet, Grab bars in shower, Hand-held shower hose ADL Screening (condition at time of admission) Patient's cognitive ability adequate to safely complete daily activities?: Yes Is the patient deaf or have difficulty hearing?: No Does the patient have difficulty seeing, even when wearing glasses/contacts?: No Does the patient have difficulty concentrating,  remembering, or making decisions?: No Patient able to express need for assistance with ADLs?: No Does the patient have difficulty dressing or bathing?: No Independently performs ADLs?: Yes (appropriate for developmental age) Does the patient have difficulty walking or climbing stairs?: No Weakness of Legs: None Weakness of Arms/Hands: None  Permission Sought/Granted                  Emotional Assessment Appearance:: Appears stated age Attitude/Demeanor/Rapport: Engaged, Gracious Affect (typically observed): Accepting, Appropriate, Calm, Pleasant Orientation: : Oriented to Place, Oriented to  Time, Oriented to Self, Oriented to Situation Alcohol / Substance Use: Tobacco Use Psych Involvement: No (comment)  Admission diagnosis:  Precordial chest pain [R07.2] Patient Active Problem List   Diagnosis Date Noted  . Angina pectoris ([place]) 10/25/2018  . Palpitations 06/06/2018  . Chest pain 07/13/2017  . Nonspecific chest pain 07/13/2017  . Essential hypertension 07/13/2017  . Mixed hyperlipidemia 07/13/2017  . Precordial chest pain   . Hypertensive urgency 06/15/2016  . Unstable angina pectoris ([place]) 06/15/2016  . Unstable angina ([place]) 06/15/2016  . Bilateral breast cysts 12/30/2015  . Vaginal discharge 09/09/2015  . BV (bacterial vaginosis) 09/09/2015  . Shingles 09/09/2015  . History of breast cancer 09/09/2015  . Syncope and collapse 08/08/2015  . Migraine with aura and with status migrainosus, not intractable 08/08/2015  . Chronic low back pain 08/08/2015  . Insomnia 08/08/2015  . Anxiety state 08/08/2015  .  Panic attacks 08/08/2015  . Anemia, iron deficiency 08/08/2015  . Pica 08/08/2015  . Angina decubitus ([place]) 07/03/2014  . 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 Encounter for gynecological examination with Papanicolaou smear of cervix 02/04/2014  . Syncope 12/14/2013  . Cervical disc disorder with radiculopathy of cervical region 02/19/2013  . H/O rotator  cuff surgery 02/19/2013  . Dyspareunia 02/01/2013  . Left shoulder pain 01/24/2013  . Rotator cuff tear 01/24/2013  . Radicular pain 01/24/2013  . Labral tear of shoulder 01/24/2013  . Complex regional pain syndrome of upper extremity 01/24/2013  . Herniated disc, cervical 01/24/2013  . Abdominal pain, other specified site 01/18/2013  . Nausea and vomiting 01/18/2013  . Rectal bleeding 01/18/2013  . Edema of left foot 10/16/2012  . Coronary atherosclerosis of native coronary artery   . Essential hypertension, benign   . HLD (hyperlipidemia)   . Tobacco abuse    PCP:  [name], MD Pharmacy:   [facility], [place] [address] [place] [address] Phone: [phone] Fax: [phone]     Social Determinants of Health (SDOH) Interventions    Readmission Risk Interventions No flowsheet data found.
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
## 2018-10-25 NOTE — Interval H&P Note (Signed)
Cath Lab Visit (complete for each Cath Lab visit)  Clinical Evaluation Leading to the Procedure:   ACS: Yes.   unstable angina  Non-ACS:  N/a   History and Physical Interval Note:  10/25/2018 1:29 PM  Bethany Wang  has presented today for surgery, with the diagnosis of angina.  The various methods of treatment have been discussed with the patient and family. After consideration of risks, benefits and other options for treatment, the patient has consented to  Procedure(s): LEFT HEART CATH AND CORONARY ANGIOGRAPHY (N/A) as a surgical intervention.  The patient's history has been reviewed, patient examined, no change in status, stable for surgery.  I have reviewed the patient's chart and labs.  Questions were answered to the patient's satisfaction.     Kathlyn [place]

## 2018-10-25 NOTE — Consult Note (Addendum)
Cardiology Consult    Patient ID: Bethany Wang; [id number]; May 03, 1965   Admit date: 10/24/2018 Date of Consult: 10/25/2018  Primary Care Provider: [name], MD Primary Cardiologist: [name], MD   Patient Profile    Bethany Wang is a 53 y.o. female with past medical history of CAD (s/p DES to RCA in 2013, DES to LAD in 2014, and catheterization in 2018 showing patent LAD stent with D2 jailed by the stent, totally occluded RCA with L-->R collaterals, 75-80% small caliber OM stenosis and patent LM with medical management recommended with consideration of CTO PCI to RCA or PCI to small OM if refractory symptoms), HTN, HLD, palpitations, prior TIA (occurring in 12/2017), and breast cancer (s/p lumpectomy) who is being seen today for the evaluation of chest pain at the request of Dr. Denton Brick.   History of Present Illness    Ms. [name] was last examined by Dr. Domenic Polite in 03/2018 and reported occasional episodes of intermittent angina along with frequent palpitations. A Holter monitor was obtained and showed PAC's and PVC's with occasional episodes of SVT and ventricular bigeminy. There were no sustained arrhythmias and Lopressor was further titrated to 75 mg twice daily.  She called the office on 10/24/2018 reporting worsening chest discomfort, fatigue, and dyspnea. ED evaluation was recommended. She reports having progressive fatigue for the past several weeks but over the past 3-4 days reports very little energy and has started to develop associated chest discomfort and diaphoresis. Says she had been walking 4.0 - 4.5 miles daily without symptoms but starting earlier this week, she started to develop chest discomfort and diaphoresis with this. Yesterday, she experienced symptoms even when walking down her driveway and felt a pressure along her entire chest. Took ASA with some improvement in her symptoms but still has discomfort and left arm pain which prompted her to come to  the ED. Resembles her prior angina but reports symptoms are now more intense. Denies any recent orthopnea or PND. She does experience intermittent lower extremity edema and reports worsening symptoms over the past few days as well.   Initial labs showed WBC 7.3, Hgb 13.6, platelets 266, Na+ 138, K+ 4.1, and creatinine 0.74. Initial and delta HS Troponin values have been negative at 3.00, 3.00, and 5.00.  COVID negative. D-dimer negative. UDS positive for opiates. CXR shows no active cardiopulmonary disease. CT Head with no acute findings. EKG shows sinus bradycardia, HR 52, with LVH and TWI along the anterior leads which is more pronounced when compared to prior tracings. Not as prominent on repeat tracing this morning.    Past Medical History:  Diagnosis Date  . Anemia   . Anxiety   . Bilateral breast cysts 12/30/2015  . Breast cancer ([place])    Left Breast Cancer  . BV (bacterial vaginosis) 09/09/2015  . Coronary atherosclerosis of native coronary artery    a. s/p DES to RCA in 2013 b. DES to LAD in 2014 c. cath in 04/2016 showing patent LAD stent with D2 jailed by LAD stent and CTO of RCA with left to right collaterals present  . Cyst of pharynx or nasopharynx    [name]  . Depression   . Essential hypertension   . Falls    x 3-4 in past year  . GERD (gastroesophageal reflux disease)   . History of breast cancer 09/09/2015  . History of hematuria   . Mixed hyperlipidemia   . Personal history of chemotherapy  Left Breast Cancer  . Personal history of radiation therapy    Left Breast Cancer  . Pre-diabetes   . PUD (peptic ulcer disease)   . Seizures ([place])   . Shingles 09/09/2015  . Stroke ([place])    12-2017 on Plavix, only deficit is headaches  . Suicide attempt [facility])    3 attempts in remote past  . Uterine cancer [facility])     Past Surgical History:  Procedure Laterality Date  . ABDOMINAL HYSTERECTOMY    . BLADDER SURGERY    . BREAST LUMPECTOMY Left   .  BREAST LUMPECTOMY Right 02/24/2018   Procedure: RIGHT BREAST LUMPECTOMY ERAS PATHWAY;  Surgeon: Jovita Kussmaul, MD;  Location: [facility];  Service: General;  Laterality: Right;  . CARDIAC CATHETERIZATION N/A 05/10/2016   Procedure: Left Heart Cath and Coronary Angiography;  Surgeon: Belva Crome, MD;  Location: [facility] CV LAB;  Service: Cardiovascular;  Laterality: N/A;  . COLONOSCOPY  May 2010   Fleishman: normal rectum, internal hemorrhoids, , benign colonic polyp  . COLONOSCOPY N/A 01/05/2017   Procedure: COLONOSCOPY;  Surgeon: Rogene Houston, MD;  [facility];  Service: Endoscopy;  Laterality: N/A;  1:00  . ESOPHAGOGASTRODUODENOSCOPY     2001 Dr. Amedeo Plenty: distal esophagitis, small hiatal hernia,. Dr. Tamala Julian 2006? no records available currently, pt also reports  EGD a few years ago with Dr. Gala Romney, do not have these reports anywhere in medical records  . ESOPHAGOGASTRODUODENOSCOPY  06/02/2011   MWN:UUVOZD pill impaction as described above s/p dilation of a probable cervical esophageal web/bx abnormal esophageal and gastric mucosa. + H.pylori gastritis   . EYE SURGERY     Removed glass  . HAND SURGERY Right   . Left breast lumpectomy     Benign  . LEFT HEART CATHETERIZATION WITH CORONARY ANGIOGRAM N/A 07/03/2014   Procedure: LEFT HEART CATHETERIZATION WITH CORONARY ANGIOGRAM;  Surgeon: Belva Crome, MD;  Location: [facility] CATH LAB;  Service: Cardiovascular;  Laterality: N/A;  . POLYPECTOMY  01/05/2017   Procedure: POLYPECTOMY;  Surgeon: Rogene Houston, MD;  [facility];  Service: Endoscopy;;  colon  . SHOULDER SURGERY Left      Home Medications:  Prior to Admission medications   Medication Sig Start Date End Date Taking? Authorizing Provider  ALPRAZolam Duanne Moron) 0.5 MG tablet Take 0.5 mg by mouth every morning.    Yes [provider]  aspirin EC 81 MG tablet Take 162 mg by mouth at bedtime.   Yes [provider]  EPINEPHrine 0.3 mg/0.3 mL IJ SOAJ  injection Inject 0.3 mg into the muscle once.    Yes [provider]  ibuprofen (ADVIL) 200 MG tablet Take 200 mg by mouth every 6 (six) hours as needed for mild pain or moderate pain.   Yes [provider]  LINZESS 145 MCG CAPS capsule Take 145 mcg by mouth daily.  10/17/18  Yes [provider]  metoprolol tartrate (LOPRESSOR) 50 MG tablet Take 1.5 tablets (75 mg total) by mouth 2 (two) times daily. Patient taking differently: Take 100 mg by mouth 2 (two) times daily.  06/15/18  Yes Satira Sark, MD  naproxen sodium (ALEVE) 220 MG tablet Take 220 mg by mouth daily as needed (for pain).   Yes [provider]  NITROSTAT 0.4 MG SL tablet PLACE 1 TAB UNDER TONGUE EVERY 5 MIN UP TO 3 TIMES AS NEEDED FOR CHEST PAIN, IF NO RELIEF CALL 911. Patient taking differently: Place 0.4 mg  under the tongue every 5 (five) minutes as needed for chest pain.  05/24/17  Yes Herminio Commons, MD  amLODipine (NORVASC) 5 MG tablet Take 5 mg by mouth daily.     [provider]  atorvastatin (LIPITOR) 40 MG tablet Take 1 tablet (40 mg total) by mouth daily at 6 PM. 06/16/16   Eber Jones, MD  clopidogrel (PLAVIX) 75 MG tablet Take 75 mg by mouth daily.  12/13/17   [provider]  escitalopram (LEXAPRO) 20 MG tablet Take 20 mg by mouth daily.  11/03/15   [provider]  isosorbide mononitrate (IMDUR) 30 MG 24 hr tablet Take 3 tablets (90 mg total) by mouth daily. 07/20/16   Satira Sark, MD  losartan (COZAAR) 50 MG tablet Take 50 mg by mouth daily.    [provider]  mometasone (ASMANEX, 30 METERED DOSES,) 220 MCG/INH inhaler Inhale 1 puff into the lungs 2 (two) times daily.    [provider]  potassium chloride SA (K-DUR,KLOR-CON) 20 MEQ tablet Take 1 tablet (20 mEq total) by mouth daily. 06/17/16   Eber Jones, MD  thyroid (ARMOUR) 60 MG tablet Take 60 mg by mouth daily.  12/20/17   [provider]  topiramate  (TOPAMAX) 25 MG tablet Take 25 mg by mouth 2 (two) times daily.  12/13/17   [provider]    Inpatient Medications: Scheduled Meds: . ALPRAZolam  0.5 mg Oral q morning - 10a  . amLODipine  5 mg Oral Daily  . aspirin EC  162 mg Oral QHS  . atorvastatin  40 mg Oral q1800  . clopidogrel  75 mg Oral Daily  . enoxaparin (LOVENOX) injection  40 mg Subcutaneous Q24H  . isosorbide mononitrate  90 mg Oral Daily  . linaclotide  145 mcg Oral Daily  . losartan  50 mg Oral Daily  . metoprolol tartrate  50 mg Oral BID   Continuous Infusions:  PRN Meds: acetaminophen **OR** acetaminophen, hydrALAZINE, morphine injection, nitroGLYCERIN, ondansetron **OR** ondansetron (ZOFRAN) IV, polyethylene glycol  Allergies:    Allergies  Allergen Reactions  . Bee Venom Anaphylaxis and Other (See Comments)    "throat swelled and had to the hospital" - Yellow jacket sting  . Penicillins Anaphylaxis and Other (See Comments)    Has patient had a PCN reaction causing immediate rash, facial/tongue/throat swelling, SOB or lightheadedness with hypotension: Yes Has patient had a PCN reaction causing severe rash involving mucus membranes or skin necrosis: Yes Has patient had a PCN reaction that required hospitalization Yes Has patient had a PCN reaction occurring within the last 10 years: Yes If all of the above answers are "NO", then may proceed with Cephalosporin use. Patient states she carries an epi-pen  . Shellfish Allergy Swelling    Social History:   Social History   Socioeconomic History  . Marital status: Married    Spouse name: Not on file  . Number of children: 3  . Years of education: Assoc  . Highest education level: Not on file  Occupational History  . Occupation: Retired    Comment: disability  Social Needs  . Financial resource strain: Not on file  . Food insecurity    Worry: Not on file    Inability: Not on file  . Transportation needs    Medical: Not on file    Non-medical:  Not on file  Tobacco Use  . Smoking status: Former Smoker    Packs/day: 0.20    Years: 32.00  Pack years: 6.40    Types: Cigarettes, Cigars    Start date: 07/05/1983    Quit date: 04/03/2012    Years since quitting: 6.5  . Smokeless tobacco: Never Used  Substance and Sexual Activity  . Alcohol use: No    Alcohol/week: 0.0 standard drinks  . Drug use: No  . Sexual activity: Yes    Birth control/protection: Surgical    Comment: hyst  Lifestyle  . Physical activity    Days per week: Not on file    Minutes per session: Not on file  . Stress: Not on file  Relationships  . Social Herbalist on phone: Not on file    Gets together: Not on file    Attends religious service: Not on file    Active member of club or organization: Not on file    Attends meetings of clubs or organizations: Not on file    Relationship status: Not on file  . Intimate partner violence    Fear of current or ex partner: Not on file    Emotionally abused: Not on file    Physically abused: Not on file    Forced sexual activity: Not on file  Other Topics Concern  . Not on file  Social History Narrative   Patient lives at home with her fiance.   Caffeine Use: 3 cups daily     Family History:    Family History  Problem Relation Age of Onset  . Colon cancer Paternal Grandfather   . Cancer Father   . Cancer Maternal Uncle   . Alzheimer's disease Paternal Aunt   . Cirrhosis Maternal Uncle   . Cancer Paternal Aunt        lung  . Heart disease Brother   . Heart attack Brother   . Mental illness Daughter   . ADD / ADHD Daughter   . Bipolar disorder Daughter   . Mental illness Son   . ADD / ADHD Son   . Bipolar disorder Son   . Mental illness Son   . ADD / ADHD Son   . Bipolar disorder Son       Review of Systems    General:  No chills, fever, night sweats or weight changes. Positive for fatigue.  Cardiovascular:  No edema, orthopnea, palpitations, paroxysmal nocturnal dyspnea.  Positive for chest pain and dyspnea on exertion.  Dermatological: No rash, lesions/masses Respiratory: No cough, dyspnea Urologic: No hematuria, dysuria Abdominal:   No nausea, vomiting, diarrhea, bright red blood per rectum, melena, or hematemesis Neurologic:  No visual changes, wkns, changes in mental status. All other systems reviewed and are otherwise negative except as noted above.  Physical Exam/Data    Vitals:   10/24/18 2059 10/24/18 2120 10/24/18 2217 10/25/18 0616  BP:  118/77 125/81 99/72  Pulse:  (!) 59 (!) 57 (!) 52  Resp:  20  17  Temp:  98.2 F (36.8 C)  (!) 97.4 F (36.3 C)  TempSrc:  Oral  Oral  SpO2: 96% 98%  96%  Weight:      Height:        Intake/Output Summary (Last 24 hours) at 10/25/2018 0750 Last data filed at 10/25/2018 0000 Gross per 24 hour  Intake 240 ml  Output 400 ml  Net -160 ml   Filed Weights   10/24/18 1842  Weight: 71 kg   Body mass index is 26.86 kg/m.   General: Pleasant, female appearing in NAD Psych: Normal affect. Neuro:  Alert and oriented X 3. Moves all extremities spontaneously. HEENT: Normal  Neck: Supple without bruits or JVD. Lungs:  Resp regular and unlabored, CTA without wheezing or rales. Heart: RRR no s3, s4, or murmurs. Abdomen: Soft, non-tender, non-distended, BS + x 4.  Extremities: No clubbing, cyanosis or lower extremity edema. DP/PT/Radials 2+ and equal bilaterally.   EKG:  The EKG was personally reviewed and demonstrates: Sinus bradycardia, HR 52, with LVH and TWI along the anterior leads which is more pronounced when compared to prior tracings. Not as prominent on repeat tracing this morning.   Telemetry:  Telemetry was personally reviewed and demonstrates: Sinus bradycardia, HR in high-40's to 60's. Occasional PVC's.    Labs/Studies     Relevant CV Studies:  Cardiac Catheterization: 04/2016  Widely patent LAD stent. Ostial 75% second diagonal jailed by LAD stent.  Totally occluded right coronary  within the previously placed stent. The right fills by left to right collaterals.  Progression of first obtuse marginal from 30% to 75-80%. The vessel caliber is in the 1.5-2 mm range and is probably best treated with medical therapy.  Widely patent left main  Normal left ventricular function.EF estimated to be 60%. EDP is 14.  RECOMMENDATIONS:  Suspect angina is coming either from the first obtuse marginal, the second diagonal which is jailed, or from the collateralized right coronary.  Intensify medical therapy. ? Add Ranexa.  Intensify risk factor modification including LDL cholesterol less than 70.  Consider CTO PCI of the RCA if symptoms become refractory.  Consider PCI on the small to moderate size first obtuse marginal, however this vessel would possibly be oversized by our smallest stent.  Holter Monitor: 05/2018 48-hour Holter monitor reviewed.  Sinus rhythm was present throughout.  Heart rate ranged from 53 bpm up to 122 bpm with average heart rate 82 bpm.  Rare atrial and ventricular ectopy noted, mostly PACs and PVCs.  There was a single brief burst of SVT lasting 5 beats.  Also ventricular bigeminy noted.  Importantly, there were no sustained arrhythmias or pauses.   Laboratory Data:  Chemistry Recent Labs  Lab 10/24/18 1334  NA 138  K 4.1  CL 105  CO2 27  GLUCOSE 95  BUN 11  CREATININE 0.74  CALCIUM 9.0  GFRNONAA >60  GFRAA >60  ANIONGAP 6    No results for input(s): PROT, ALBUMIN, AST, ALT, ALKPHOS, BILITOT in the last 168 hours. Hematology Recent Labs  Lab 10/24/18 1241  WBC 7.3  RBC 4.98  HGB 13.6  HCT 42.4  MCV 85.1  MCH 27.3  MCHC 32.1  RDW 14.2  PLT 266   Cardiac EnzymesNo results for input(s): TROPONINI in the last 168 hours. No results for input(s): TROPIPOC in the last 168 hours.  BNPNo results for input(s): BNP, PROBNP in the last 168 hours.  DDimer  Recent Labs  Lab 10/24/18 1759  DDIMER 0.39    Radiology/Studies:  Dg Chest  2 View  Result Date: 10/24/2018 CLINICAL DATA:  Chest pain EXAM: CHEST - 2 VIEW COMPARISON:  April 13, 2018 FINDINGS: The heart size and mediastinal contours are within normal limits. The aorta is tortuous. Both lungs are clear. The visualized skeletal structures are stable. IMPRESSION: No active cardiopulmonary disease. Electronically Signed   By: Abelardo Diesel M.D.   On: 10/24/2018 13:29   Ct Head Wo Contrast  Result Date: 10/24/2018 CLINICAL DATA:  53 y/o  F; left eye blurriness and headache. EXAM: CT HEAD WITHOUT CONTRAST TECHNIQUE: Contiguous axial  images were obtained from the base of the skull through the vertex without intravenous contrast. COMPARISON:  12/11/2017 CT head. FINDINGS: Brain: No evidence of acute infarction, hemorrhage, hydrocephalus, extra-axial collection or mass lesion/mass effect. Vascular: Calcific atherosclerosis of the internal carotid arteries. Skull: Normal. Negative for fracture or focal lesion. Sinuses/Orbits: No acute finding. Other: None. IMPRESSION: Stable negative CT of the head. Electronically Signed   By: Kristine Garbe M.D.   On: 10/24/2018 21:47     Assessment & Plan    1. Chest Pain Concerning for Accelerating Angina - she was previously walking 4+ miles per day without symptoms but starting earlier this week, developed associated chest pressure and diaphoresis with this. She reports now having symptoms when walking along her driveway and radiating down her left arm.  - Initial and delta HS Troponin values have been negative at 3.00, 3.00, and 5.00. COVID negative. D-dimer negative. UDS positive for opiates. EKG shows sinus bradycardia, HR 52, with LVH and TWI along the anterior leads which is more pronounced when compared to prior tracings. Not as prominent on repeat tracing this morning.  - her overall presentation is concerning for accelerating angina. Will review with Dr. Bronson Ing but would anticipate a repeat cardiac catheterization for  definitive evaluation. The patient understands that risks include but are not limited to stroke (1 in 1000), death (1 in 40), kidney failure [usually temporary] (1 in 500), bleeding (1 in 200), allergic reaction [possibly serious] (1 in 200).   - she is pain-free at this time. If symptoms represent, would start Heparin. Continue ASA, Plavix, BB, and Imdur.   2. CAD - s/p DES to RCA in 2013, DES to LAD in 2014, and catheterization in 2018 showing patent LAD stent with D2 jailed by the stent, totally occluded RCA with L-->R collaterals, 75-80% small caliber OM stenosis and patent LM with medical management recommended with consideration of CTO PCI to RCA or PCI to small OM if refractory symptoms. - plan for repeat ischemic evaluation as outlined above.  - remains on ASA (162mg  daily per Neurology), Plavix, Imdur, BB, and statin therapy.  3. Palpitations - prior monitor showed PAC's and PVC's with occasional episodes of SVT and ventricular bigeminy. No significant arrhythmias by review of telemetry thus far but she has been bradycardiac with HR in the mid-40's at times. Agree with reducing PTA Lopressor 75mg  BID to 50mg  BID. Continue to follow on telemetry.   4. HTN - BP has been variable at 99/72 - 188/114 within the past 24 hours. Continue to follow with administration of AM medications.  - she has been continued on PTA Amlodipine, Losartan, Imdur and Lopressor.   5. HLD - FLP in 12/2017 showed total cholesterol of 193, HDL 52, and LDL 122. Not at goal of less than 70. Will recheck FLP. She has been continued on PTA Atorvastatin 40mg  daily.   6. History of TIA - she initially told me this happened 2 months ago while hospitalized at [facility] but by review of records occurred in 12/2017. Head CT this admission shows no acute findings.   For questions or updates, please contact [place] Please consult [email or web address] for contact info under Cardiology/STEMI.  Signed, Erma Heritage,  PA-C 10/25/2018, 7:50 AM Pager: [phone]  The patient was seen and examined, and I agree with the history, physical exam, assessment and plan as documented above, with modifications as noted below. I have also personally reviewed all relevant documentation, old records, labs, and both radiographic and cardiovascular studies.  I have also independently interpreted old and new ECG's.  This is a very pleasant 53 year old woman with a history of coronary disease with most recent cardiac catheterization from 2018 reviewed above with jailing of the second diagonal by previously placed LAD stent, occluded RCA with left-to-right collaterals, and 75 to 80% stenosis in a small caliber obtuse marginal branch felt best to be treated with medical therapy as per Dr. Tamala Julian.  She has been having increasing exertional chest pain and shortness of breath over the past 2 weeks.  This past Saturday, she was unable to walk to her mailbox.  She has also been profusely sweating which is unusual for her.  She had been walking 4 to 4-1/2 miles daily.  She has been eating healthy.  She quit smoking for 5 years ago.  ECG yesterday demonstrated more pronounced precordial T wave inversions when compared to today.  Labs reviewed above with normal troponins.  Overall presentation consistent with accelerating angina.  I feel she would be best served with coronary angiography.  Continue aspirin, Plavix, beta-blocker, statin, and Imdur.  Due to low normal blood pressure, losartan will be held this morning.  We will transfer to [facility] for coronary angiography.   Kate Sable, MD, [facility]  10/25/2018 10:16 AM

## 2018-10-25 NOTE — Progress Notes (Signed)
*  PRELIMINARY RESULTS* Echocardiogram 2D Echocardiogram has been performed.  Leavy Cella 10/25/2018, 10:51 AM

## 2018-10-25 NOTE — Care Management Obs Status (Signed)
[place] NOTIFICATION   Patient Details  Name: Bethany Wang MRN: [id number] Date of Birth: 1966-01-24   Medicare Observation Status Notification Given:  Yes    Candie Chroman, LCSW 10/25/2018, 4:16 PM

## 2018-10-26 ENCOUNTER — Encounter (HOSPITAL_COMMUNITY): Payer: Self-pay | Admitting: Cardiovascular Disease

## 2018-10-26 DIAGNOSIS — I200 Unstable angina: Secondary | ICD-10-CM | POA: Diagnosis not present

## 2018-10-26 DIAGNOSIS — I10 Essential (primary) hypertension: Secondary | ICD-10-CM | POA: Diagnosis not present

## 2018-10-26 DIAGNOSIS — I25110 Atherosclerotic heart disease of native coronary artery with unstable angina pectoris: Secondary | ICD-10-CM | POA: Diagnosis not present

## 2018-10-26 DIAGNOSIS — I351 Nonrheumatic aortic (valve) insufficiency: Secondary | ICD-10-CM | POA: Diagnosis not present

## 2018-10-26 DIAGNOSIS — Z955 Presence of coronary angioplasty implant and graft: Secondary | ICD-10-CM | POA: Diagnosis not present

## 2018-10-26 DIAGNOSIS — I209 Angina pectoris, unspecified: Secondary | ICD-10-CM | POA: Diagnosis not present

## 2018-10-26 DIAGNOSIS — E782 Mixed hyperlipidemia: Secondary | ICD-10-CM | POA: Diagnosis not present

## 2018-10-26 LAB — LIPID PANEL
Cholesterol: 192 mg/dL (ref 0–200)
HDL: 45 mg/dL (ref >40)
LDL Cholesterol: 133 mg/dL — ABNORMAL HIGH (ref 0–99)
Total CHOL/HDL Ratio: 4.3 RATIO
Triglycerides: 71 mg/dL (ref <150)
VLDL: 14 mg/dL (ref 0–40)

## 2018-10-26 MED ORDER — ISOSORBIDE MONONITRATE ER 30 MG PO TB24: 90.0000 mg | ORAL_TABLET | Freq: Every day | ORAL | Status: DC

## 2018-10-26 MED ORDER — ASPIRIN EC 81 MG PO TBEC: 81.0000 mg | DELAYED_RELEASE_TABLET | Freq: Every day | ORAL | Status: DC

## 2018-10-26 MED ORDER — LOSARTAN POTASSIUM 50 MG PO TABS: 50.0000 mg | ORAL_TABLET | Freq: Every day | ORAL | Status: DC

## 2018-10-26 NOTE — Discharge Summary (Addendum)
The patient has been seen in conjunction with Reino Bellis, NP. All aspects of care have been considered and discussed. The patient has been personally interviewed, examined, and all clinical data has been reviewed.   Catheterization is basically unchanged from prior data.  Dyspnea could be related to discontinuation of diuretic therapy, which in her situation could increase angina because of increased LV diameter via the loss of Laplace.  Continue medical therapy/prevention.  Consider reinstitution of diuretic therapy.  Vital signs, access site, and kidney function is stable.  TOC follow-up with Dr. Domenic Polite 7 to 14 days.  Discharge Summary    Patient ID: Bethany Wang,  MRN: [id number], DOB/AGE: 1965/10/13 53 y.o.  Admit date: 10/24/2018 Discharge date: 10/26/2018  Primary Care Provider: [name] Primary Cardiologist: [name], MD  Discharge Diagnoses    Active Problems:   Chest pain   Essential hypertension   Mixed hyperlipidemia   Angina pectoris ([place])   Allergies Allergies  Allergen Reactions  . Bee Venom Anaphylaxis and Other (See Comments)    "throat swelled and had to the hospital" - Yellow jacket sting  . Penicillins Anaphylaxis and Other (See Comments)    Has patient had a PCN reaction causing immediate rash, facial/tongue/throat swelling, SOB or lightheadedness with hypotension: Yes Has patient had a PCN reaction causing severe rash involving mucus membranes or skin necrosis: Yes Has patient had a PCN reaction that required hospitalization Yes Has patient had a PCN reaction occurring within the last 10 years: Yes If all of the above answers are "NO", then may proceed with Cephalosporin use. Patient states she carries an epi-pen  . Shellfish Allergy Swelling    Diagnostic Studies/Procedures    Cath: 10/25/18   Dist LAD lesion is 50% stenosed.  Ost 2nd Diag lesion is 75% stenosed.  1st Mrg lesion is 60% stenosed.  Prox Cx to  Dist Cx lesion is 30% stenosed.  2nd Mrg lesion is 20% stenosed.  Prox RCA to Mid RCA lesion is 100% stenosed.  Mid LAD-1 lesion is 20% stenosed.  Mid LAD-2 lesion is 30% stenosed.  Prox LAD lesion is 30% stenosed.  2nd Diag lesion is 70% stenosed.   1.  Significant underlying two-vessel coronary artery disease with widely patent LAD stent with minimal restenosis.  Chronically occluded RCA stent with well-developed left-to-right collaterals.  Jailed second diagonal has a stable 70% ostial stenosis.  Moderate OM1 disease. 2.  Left ventricular angiography was not performed.  EF is normal by echo. 3.  Normal left ventricular end-diastolic pressure at 11 mmHg.  Recommendations: I do not see a culprit for the patient's presumed unstable angina.  Her coronary anatomy is unchanged from 2018 and if anything, the disease in second diagonal and OM1 appear improved in appearance. I recommend continuing aggressive medical therapy.  Diagnostic Dominance: Co-dominant  TTE: 10/25/18  IMPRESSIONS    1. The left ventricle has normal systolic function with an ejection fraction of 60-65%. The cavity size was normal. There is moderate concentric left ventricular hypertrophy. Left ventricular diastolic Doppler parameters are consistent with  pseudonormalization. No evidence of left ventricular regional wall motion abnormalities.  2. The right ventricle has normal systolic function. The cavity was normal. There is mildly increased right ventricular wall thickness.  3. The mitral valve is grossly normal.  4. The tricuspid valve is grossly normal.  5. The aortic valve is tricuspid. Aortic valve regurgitation is moderate by color flow Doppler.   _____________   History of  Present Illness     Bethany Wang is a 53 y.o. female with past medical history of CAD (s/p DES to RCA in 2013, DES to LAD in 2014, and catheterization in 2018 showing patent LAD stent with D2 jailed by the stent, totally  occluded RCA with L-->R collaterals, 75-80% small caliber OM stenosis and patent LM with medical management recommended with consideration of CTO PCI to RCA or PCI to small OM if refractory symptoms), HTN, HLD, palpitations, prior TIA (occurring in 12/2017), and breast cancer (s/p lumpectomy) who was seen for the evaluation of chest pain at the request of Dr. Denton Brick.   Ms. Shippy was last examined by Dr. Domenic Polite in 03/2018 and reported occasional episodes of intermittent angina along with frequent palpitations. A Holter monitor was obtained and showed PAC's and PVC's with occasional episodes of SVT and ventricular bigeminy. There were no sustained arrhythmias and Lopressor was further titrated to 75 mg twice daily.  She called the office on 10/24/2018 reporting worsening chest discomfort, fatigue, and dyspnea. ED evaluation was recommended. She reported having progressive fatigue for the past several weeks but over the past 3-4 days prior to [place] very little energy and had started to develop associated chest discomfort and diaphoresis. Said she had been walking 4.0 - 4.5 miles daily without symptoms but starting earlier that week, she started to develop chest discomfort and diaphoresis with this. Took ASA with some improvement in her symptoms but still has discomfort and left arm pain which prompted her to come to the ED. Resembled her prior angina but reports symptoms are now more intense. Denies any recent orthopnea or PND.   Initial labs showed WBC 7.3, Hgb 13.6, platelets 266, Na+ 138, K+ 4.1, and creatinine 0.74. Initial and delta HS Troponin values have been negative at 3.00, 3.00, and 5.00.  COVID negative. D-dimer negative. UDS positive for opiates. CXR shows no active cardiopulmonary disease. CT Head with no acute findings. EKG showed sinus bradycardia, HR 52, with LVH and TWI along the anterior leads which was more pronounced when compared to prior tracings. Given symptoms she was  transferred to [facility] for further work up.   Hospital Course     Underwent cardiac cath noted above with significant underlying two-vessel coronary artery disease with widely patent LAD stent with minimal restenosis.  Chronically occluded RCA stent with well-developed left-to-right collaterals.  Jailed second diagonal has a stable 70% ostial stenosis.  Moderate OM1 disease. No culprit noted for symptoms. Recommendation to continue with aggressive medical therapy. Her home medications were continued the same. Ambulated without recurrent chest discomfort.    TANA TREFRY was seen by Dr. Tamala Julian and determined stable for discharge home. Follow up in the office has been arranged. Medications are listed below.   _____________  Discharge Vitals Blood pressure (!) 156/85, pulse (!) 55, temperature 98 F (36.7 C), temperature source Oral, resp. rate 18, height 5\' 4"  (1.626 m), weight 71.2 kg, SpO2 98 %.  Filed Weights   10/24/18 1842 10/26/18 0503  Weight: 71 kg 71.2 kg    Labs & Radiologic Studies    CBC Recent Labs    10/24/18 1241  WBC 7.3  HGB 13.6  HCT 42.4  MCV 85.1  PLT 037   Basic Metabolic Panel Recent Labs    10/24/18 1334  NA 138  K 4.1  CL 105  CO2 27  GLUCOSE 95  BUN 11  CREATININE 0.74  CALCIUM 9.0   Liver Function Tests No results for  input(s): AST, ALT, ALKPHOS, BILITOT, PROT, ALBUMIN in the last 72 hours. No results for input(s): LIPASE, AMYLASE in the last 72 hours. Cardiac Enzymes No results for input(s): CKTOTAL, CKMB, CKMBINDEX, TROPONINI in the last 72 hours. BNP Invalid input(s): POCBNP D-Dimer Recent Labs    10/24/18 1759  DDIMER 0.39   Hemoglobin A1C No results for input(s): HGBA1C in the last 72 hours. Fasting Lipid Panel Recent Labs    10/26/18 0415  CHOL 192  HDL 45  LDLCALC 133*  TRIG 71  CHOLHDL 4.3   Thyroid Function Tests No results for input(s): TSH, T4TOTAL, T3FREE, THYROIDAB in the last 72 hours.  Invalid input(s):  FREET3 _____________  Dg Chest 2 View  Result Date: 10/24/2018 CLINICAL DATA:  Chest pain EXAM: CHEST - 2 VIEW COMPARISON:  April 13, 2018 FINDINGS: The heart size and mediastinal contours are within normal limits. The aorta is tortuous. Both lungs are clear. The visualized skeletal structures are stable. IMPRESSION: No active cardiopulmonary disease. Electronically Signed   By: Abelardo Diesel M.D.   On: 10/24/2018 13:29   Ct Head Wo Contrast  Result Date: 10/24/2018 CLINICAL DATA:  53 y/o  F; left eye blurriness and headache. EXAM: CT HEAD WITHOUT CONTRAST TECHNIQUE: Contiguous axial images were obtained from the base of the skull through the vertex without intravenous contrast. COMPARISON:  12/11/2017 CT head. FINDINGS: Brain: No evidence of acute infarction, hemorrhage, hydrocephalus, extra-axial collection or mass lesion/mass effect. Vascular: Calcific atherosclerosis of the internal carotid arteries. Skull: Normal. Negative for fracture or focal lesion. Sinuses/Orbits: No acute finding. Other: None. IMPRESSION: Stable negative CT of the head. Electronically Signed   By: Kristine Garbe M.D.   On: 10/24/2018 21:47   Dg Femur, Min 2 Views Right  Result Date: 10/03/2018 CLINICAL DATA:  Right thigh pain after grandson jumped on her leg last night. EXAM: RIGHT FEMUR 2 VIEWS COMPARISON:  None. FINDINGS: There is no evidence of fracture or other focal bone lesions. Soft tissues are unremarkable. IMPRESSION: Negative. Electronically Signed   By: Titus Dubin M.D.   On: 10/03/2018 15:57   Disposition   Pt is being discharged home today in good condition.  Follow-up Plans & Appointments    Follow-up Information    Satira Sark, MD.   Specialty: Cardiology Why: Office will call you with a follow up appt.  Contact information: [place] [address] [phone]        [name], MD. Schedule an appointment as soon as possible for a visit in 10 days.    Specialty: Internal Medicine Contact information: [place] [address] [phone]          Discharge Instructions    Call MD for:  redness, tenderness, or signs of infection (pain, swelling, redness, odor or green/yellow discharge around incision site)   Complete by: As directed    Diet - low sodium heart healthy   Complete by: As directed    Discharge instructions   Complete by: As directed    Radial Site Care Refer to this sheet in the next few weeks. These instructions provide you with information on caring for yourself after your procedure. Your caregiver may also give you more specific instructions. Your treatment has been planned according to current medical practices, but problems sometimes occur. Call your caregiver if you have any problems or questions after your procedure. HOME CARE INSTRUCTIONS You may shower the day after the procedure.Remove the bandage (dressing) and gently  wash the site with plain soap and water.Gently pat the site dry.  Do not apply powder or lotion to the site.  Do not submerge the affected site in water for 3 to 5 days.  Inspect the site at least twice daily.  Do not flex or bend the affected arm for 24 hours.  No lifting over 5 pounds (2.3 kg) for 5 days after your procedure.  Do not drive home if you are discharged the same day of the procedure. Have someone else drive you.  You may drive 24 hours after the procedure unless otherwise instructed by your caregiver.  What to expect: Any bruising will usually fade within 1 to 2 weeks.  Blood that collects in the tissue (hematoma) may be painful to the touch. It should usually decrease in size and tenderness within 1 to 2 weeks.  SEEK IMMEDIATE MEDICAL CARE IF: You have unusual pain at the radial site.  You have redness, warmth, swelling, or pain at the radial site.  You have drainage (other than a small amount of blood on the dressing).  You have chills.  You have a fever or  persistent symptoms for more than 72 hours.  You have a fever and your symptoms suddenly get worse.  Your arm becomes pale, cool, tingly, or numb.  You have heavy bleeding from the site. Hold pressure on the site.   Increase activity slowly   Complete by: As directed        Discharge Medications     Medication List    STOP taking these medications   ibuprofen 200 MG tablet Commonly known as: ADVIL   naproxen sodium 220 MG tablet Commonly known as: ALEVE   potassium chloride SA 20 MEQ tablet Commonly known as: K-DUR     TAKE these medications   amLODipine 5 MG tablet Commonly known as: NORVASC Take 5 mg by mouth daily.   aspirin EC 81 MG tablet Take 1 tablet (81 mg total) by mouth at bedtime. What changed: how much to take   atorvastatin 40 MG tablet Commonly known as: LIPITOR Take 1 tablet (40 mg total) by mouth daily at 6 PM.   clopidogrel 75 MG tablet Commonly known as: PLAVIX Take 75 mg by mouth daily.   EPINEPHrine 0.3 mg/0.3 mL Soaj injection Commonly known as: EPI-PEN Inject 0.3 mg into the muscle once.   escitalopram 20 MG tablet Commonly known as: LEXAPRO Take 20 mg by mouth daily.   isosorbide mononitrate 30 MG 24 hr tablet Commonly known as: IMDUR Take 3 tablets (90 mg total) by mouth daily. Start taking on: October 27, 2018   Linzess 145 MCG Caps capsule Generic drug: linaclotide Take 145 mcg by mouth daily.   losartan 50 MG tablet Commonly known as: COZAAR Take 1 tablet (50 mg total) by mouth daily. Start taking on: October 27, 2018   metoprolol tartrate 50 MG tablet Commonly known as: LOPRESSOR Take 1.5 tablets (75 mg total) by mouth 2 (two) times daily. What changed: how much to take   Nitrostat 0.4 MG SL tablet Generic drug: nitroGLYCERIN PLACE 1 TAB UNDER TONGUE EVERY 5 MIN UP TO 3 TIMES AS NEEDED FOR CHEST PAIN, IF NO RELIEF CALL 911. What changed: See the new instructions.   Xanax 0.5 MG tablet Generic drug: ALPRAZolam Take 0.5  mg by mouth every morning.          Acute coronary syndrome (MI, NSTEMI, STEMI, etc) this admission?: No.     Outstanding Labs/Studies  N/a   Duration of Discharge Encounter   Greater than 30 minutes including physician time.  Signed, Reino Bellis NP-C 10/26/2018, 12:05 PM

## 2018-10-26 NOTE — Progress Notes (Signed)
Patient received paper work however pt did not want to wait for verbal instructions.

## 2018-10-26 NOTE — Progress Notes (Signed)
Patient has removed and tele, Iv, and is dressed stated when husband gets here she is walking out. I have attempted to notify MD to make aware.

## 2018-11-22 NOTE — Progress Notes (Signed)
Cardiology Office Note   Date:  11/23/2018   ID:  Bethany Wang, DOB 1965-11-21, MRN [id number]  PCP:  [name], MD Cardiologist:  [name], MD 03/31/2018 Rosaria Ferries, PA-C   No chief complaint on file.   History of Present Illness: Bethany Wang is a 53 y.o. female with a history of palpitations, DES RCA 2013, DES LAD 2014 (D2 jailed), cath 2018 w/ patent stent LAD w/ CTO RCA, HTN, HLD, GERD, breast CA s/p lumpectomy and chemo 1990s, depression, PUD, CVA  Admitted 07/14-07/16/2020 with CP, cath w/ no sig change, med rx  Bethany Wang presents for cardiology follow up.  She lost her mother the week she was in the hospital.   She was evicted the week of her mother's funeral.   She and her husband are staying in a hotel. They are trying to get into places, but it will be several weeks. Cannot stay with family, they do not get along.   She is taking her meds but her BP is through the roof because of the stress.   She is cleaning, not walking much because of the heat. Gets some LE edema during the day, does not wake with it.  Does not put her feet up during the day.  Wakes in the night sometimes with SOB, but says it is because her heart is stopping.  She has been told that she snores, but she is not aware of it.  She sleeps poorly, very restless and wakes up after a few hours. Cannot get back to sleep. Gets fatigued because of the lack of sleep.   She is aware that her blood pressure is elevated, feels that it is at least partly related to stress.   Past Medical History:  Diagnosis Date  . Anemia   . Anxiety   . Bilateral breast cysts 12/30/2015  . Breast cancer ([place])    Left Breast Cancer  . BV (bacterial vaginosis) 09/09/2015  . Coronary artery disease   . Coronary atherosclerosis of native coronary artery    a. s/p DES to RCA in 2013 b. DES to LAD in 2014 c. cath in 04/2016 showing patent LAD stent with D2 jailed by LAD stent and CTO of RCA  with left to right collaterals present  . Cyst of pharynx or nasopharynx    [name]  . Depression   . Essential hypertension   . Falls    x 3-4 in past year  . GERD (gastroesophageal reflux disease)   . History of breast cancer 09/09/2015  . History of hematuria   . Mixed hyperlipidemia   . Personal history of chemotherapy    Left Breast Cancer  . Personal history of radiation therapy    Left Breast Cancer  . Pre-diabetes   . PUD (peptic ulcer disease)   . Seizures ([place])   . Shingles 09/09/2015  . Stroke ([place])    12-2017 on Plavix, only deficit is headaches  . Suicide attempt [facility])    3 attempts in remote past  . Uterine cancer [facility])     Past Surgical History:  Procedure Laterality Date  . ABDOMINAL HYSTERECTOMY    . BLADDER SURGERY    . BREAST LUMPECTOMY Left   . BREAST LUMPECTOMY Right 02/24/2018   Procedure: RIGHT BREAST LUMPECTOMY ERAS PATHWAY;  Surgeon: Jovita Kussmaul, MD;  Location: [facility];  Service: General;  Laterality: Right;  . CARDIAC CATHETERIZATION N/A 05/10/2016   Procedure: Left Heart  Cath and Coronary Angiography;  Surgeon: Belva Crome, MD;  Location: [place] [facility];  Service: Cardiovascular;  Laterality: N/A;  . COLONOSCOPY  May 2010   Fleishman: normal rectum, internal hemorrhoids, , benign colonic polyp  . COLONOSCOPY N/A 01/05/2017   Procedure: COLONOSCOPY;  Surgeon: Rogene Houston, MD;  [facility];  Service: Endoscopy;  Laterality: N/A;  1:00  . ESOPHAGOGASTRODUODENOSCOPY     2001 Dr. Amedeo Plenty: distal esophagitis, small hiatal hernia,. Dr. Tamala Julian 2006? no records available currently, pt also reports  EGD a few years ago with Dr. Gala Romney, do not have these reports anywhere in medical records  . ESOPHAGOGASTRODUODENOSCOPY  06/02/2011   ION:GEXBMW pill impaction as described above s/p dilation of a probable cervical esophageal web/bx abnormal esophageal and gastric mucosa. + H.pylori gastritis   . EYE SURGERY     Removed  glass  . HAND SURGERY Right   . Left breast lumpectomy     Benign  . LEFT HEART CATH AND CORONARY ANGIOGRAPHY N/A 10/25/2018   Procedure: LEFT HEART CATH AND CORONARY ANGIOGRAPHY;  Surgeon: Wellington Hampshire, MD;  Location: [place] [facility];  Service: Cardiovascular;  Laterality: N/A;  . LEFT HEART CATHETERIZATION WITH CORONARY ANGIOGRAM N/A 07/03/2014   Procedure: LEFT HEART CATHETERIZATION WITH CORONARY ANGIOGRAM;  Surgeon: Belva Crome, MD;  Location: [facility] CATH LAB;  Service: Cardiovascular;  Laterality: N/A;  . POLYPECTOMY  01/05/2017   Procedure: POLYPECTOMY;  Surgeon: Rogene Houston, MD;  [facility];  Service: Endoscopy;;  colon  . SHOULDER SURGERY Left     Current Outpatient Medications  Medication Sig Dispense Refill  . ALPRAZolam (XANAX) 0.5 MG tablet Take 0.5 mg by mouth every morning.     [name] amLODipine (NORVASC) 5 MG tablet Take 5 mg by mouth daily.     [name] aspirin EC 81 MG tablet Take 1 tablet (81 mg total) by mouth at bedtime.    [name] atorvastatin (LIPITOR) 40 MG tablet Take 1 tablet (40 mg total) by mouth daily at 6 PM. 30 tablet 0  . clopidogrel (PLAVIX) 75 MG tablet Take 75 mg by mouth daily.     [name] EPINEPHrine 0.3 mg/0.3 mL IJ SOAJ injection Inject 0.3 mg into the muscle once.     . escitalopram (LEXAPRO) 20 MG tablet Take 20 mg by mouth daily.     . isosorbide mononitrate (IMDUR) 30 MG 24 hr tablet Take 3 tablets (90 mg total) by mouth daily.    [name] LINZESS 145 MCG CAPS capsule Take 145 mcg by mouth daily.     [name] losartan (COZAAR) 50 MG tablet Take 1 tablet (50 mg total) by mouth daily.    . metoprolol tartrate (LOPRESSOR) 50 MG tablet Take 50 mg by mouth 2 (two) times daily.    [name] NITROSTAT 0.4 MG SL tablet PLACE 1 TAB UNDER TONGUE EVERY 5 MIN UP TO 3 TIMES AS NEEDED FOR CHEST PAIN, IF NO RELIEF CALL 911. (Patient taking differently: Place 0.4 mg under the tongue every 5 (five) minutes as needed for chest pain. ) 25 tablet 0  . potassium chloride 20 MEQ/15ML (10%) SOLN  Take 15 mLs by mouth daily.     No current facility-administered medications for this visit.     Allergies:   Bee venom, Penicillins, and Shellfish allergy    Social History:  The patient  reports that she quit smoking about 6 years ago. Her smoking use included cigarettes and cigars. She started smoking about  35 years ago. She has a 6.40 pack-year smoking history. She has never used smokeless tobacco. She reports that she does not drink alcohol or use drugs.   Family History:  The patient's family history includes ADD / ADHD in her daughter, son, and son; Alzheimer's disease in her paternal aunt; Bipolar disorder in her daughter, son, and son; Cancer in her father, maternal uncle, and paternal aunt; Cirrhosis in her maternal uncle; Colon cancer in her paternal grandfather; Heart attack in her brother; Heart disease in her brother; Mental illness in her daughter, son, and son.  She indicated that her mother is alive. She indicated that her father is deceased. She indicated that all of her four sisters are alive. She indicated that all of her nine brothers are alive. She indicated that her paternal grandfather is deceased. She indicated that her daughter is alive. She indicated that both of her sons are alive. She indicated that both of her maternal uncles are deceased. She indicated that both of her paternal aunts are deceased. She indicated that her other is alive.   ROS:  Please see the history of present illness. All other systems are reviewed and negative.    PHYSICAL EXAM: VS:  BP (!) 170/101   Pulse 68   Ht 5\' 4"  (1.626 m)   Wt 156 lb (70.8 kg)   SpO2 100% Comment: on room air  BMI 26.78 kg/m  , BMI Body mass index is 26.78 kg/m. GEN: Well nourished, well developed, female in no acute distress HEENT: normal for age  Neck: no JVD, no carotid bruit, no masses Cardiac: RRR; no murmur, no rubs, or gallops Respiratory:  clear to auscultation bilaterally, normal work of breathing GI:  soft, nontender, nondistended, + BS MS: no deformity or atrophy; no edema; distal pulses are 2+ in all 4 extremities  Skin: warm and dry, no rash Neuro:  Strength and sensation are intact Psych: euthymic mood, full affect   EKG:  EKG is not ordered today.   ECHO:   1. The left ventricle has normal systolic function with an ejection fraction of 60-65%. The cavity size was normal. There is moderate concentric left ventricular hypertrophy. Left ventricular diastolic Doppler parameters are consistent with  pseudonormalization. No evidence of left ventricular regional wall motion abnormalities.  2. The right ventricle has normal systolic function. The cavity was normal. There is mildly increased right ventricular wall thickness.  3. The mitral valve is grossly normal.  4. The tricuspid valve is grossly normal.  5. The aortic valve is tricuspid. Aortic valve regurgitation is moderate by color flow Doppler.  CATH:  Cath: 10/25/18  Dist LAD lesion is 50% stenosed.  Ost 2nd Diag lesion is 75% stenosed.  1st Mrg lesion is 60% stenosed.  Prox Cx to Dist Cx lesion is 30% stenosed.  2nd Mrg lesion is 20% stenosed.  Prox RCA to Mid RCA lesion is 100% stenosed.  Mid LAD-1 lesion is 20% stenosed.  Mid LAD-2 lesion is 30% stenosed.  Prox LAD lesion is 30% stenosed.  2nd Diag lesion is 70% stenosed.  1. Significant underlying two-vessel coronary artery disease with widely patent LAD stent with minimal restenosis. Chronically occluded RCA stent with well-developed left-to-right collaterals. Jailed second diagonal has a stable 70% ostial stenosis. Moderate OM1 disease. 2. Left ventricular angiography was not performed. EF is normal by echo. 3. Normal left ventricular end-diastolic pressure at 11 mmHg.  Recommendations: I do not see a culprit for the patient's presumed unstable angina. Her coronary  anatomy is unchanged from 2018 and if anything, the disease in second diagonal and  OM1 appear improved in appearance. I recommend continuing aggressive medical therapy.  Diagnostic Dominance: Co-dominant  TTE: 10/25/18  IMPRESSIONS   1. The left ventricle has normal systolic function with an ejection fraction of 60-65%. The cavity size was normal. There is moderate concentric left ventricular hypertrophy. Left ventricular diastolic Doppler parameters are consistent with  pseudonormalization. No evidence of left ventricular regional wall motion abnormalities. 2. The right ventricle has normal systolic function. The cavity was normal. There is mildly increased right ventricular wall thickness. 3. The mitral valve is grossly normal. 4. The tricuspid valve is grossly normal. 5. The aortic valve is tricuspid. Aortic valve regurgitation is moderate by color flow Doppler.    MONITOR: 06/06/2018 Results reviewed. No arrhythmias noted, but she may be feeling palpitations with atrial and ventricular ectopy as outlined. We could try and increase her metoprolol to 75 mg twice daily.  A copy of this test should be forwarded to [name], MD.  48-hour Holter monitor reviewed.  Sinus rhythm was present throughout.  Heart rate ranged from 53 bpm up to 122 bpm with average heart rate 82 bpm.  Rare atrial and ventricular ectopy noted, mostly PACs and PVCs.  There was a single brief burst of SVT lasting 5 beats.  Also ventricular bigeminy noted.  Importantly, there were no sustained arrhythmias or pauses.  Recent Labs: 10/24/2018: BUN 11; Creatinine, Ser 0.74; Hemoglobin 13.6; Platelets 266; Potassium 4.1; Sodium 138  CBC    Component Value Date/Time   WBC 7.3 10/24/2018 1241   RBC 4.98 10/24/2018 1241   HGB 13.6 10/24/2018 1241   HGB 13.5 11/10/2015 0914   HCT 42.4 10/24/2018 1241   HCT 42.8 11/10/2015 0914   PLT 266 10/24/2018 1241   PLT 251 11/10/2015 0914   MCV 85.1 10/24/2018 1241   MCV 87 11/10/2015 0914   MCH 27.3 10/24/2018 1241   MCHC 32.1  10/24/2018 1241   RDW 14.2 10/24/2018 1241   RDW 15.3 11/10/2015 0914   LYMPHSABS 3.1 04/13/2018 1628   LYMPHSABS 3.2 (H) 11/10/2015 0914   MONOABS 0.4 04/13/2018 1628   EOSABS 0.0 04/13/2018 1628   EOSABS 0.1 11/10/2015 0914   BASOSABS 0.0 04/13/2018 1628   BASOSABS 0.0 11/10/2015 0914   CMP Latest Ref Rng & Units 10/24/2018 06/19/2018 04/13/2018  Glucose 70 - 99 mg/dL 95 - 98  BUN 6 - 20 mg/dL 11 - 7  Creatinine 0.44 - 1.00 mg/dL 0.74 0.70 0.74  Sodium 135 - 145 mmol/L 138 - 140  Potassium 3.5 - 5.1 mmol/L 4.1 - 3.4(L)  Chloride 98 - 111 mmol/L 105 - 108  CO2 22 - 32 mmol/L 27 - 25  Calcium 8.9 - 10.3 mg/dL 9.0 - 9.1  Total Protein 6.5 - 8.1 g/dL - - -  Total Bilirubin 0.3 - 1.2 mg/dL - - -  Alkaline Phos 38 - 126 U/L - - -  AST 15 - 41 U/L - - -  ALT 14 - 54 U/L - - -     Lipid Panel Lab Results  Component Value Date   CHOL 192 10/26/2018   HDL 45 10/26/2018   LDLCALC 133 (H) 10/26/2018   TRIG 71 10/26/2018   CHOLHDL 4.3 10/26/2018      Wt Readings from Last 3 Encounters:  11/23/18 156 lb (70.8 kg)  10/26/18 156 lb 14.4 oz (71.2 kg)  04/13/18 151 lb 7.3 oz (68.7 kg)  Other studies Reviewed: Additional studies/ records that were reviewed today include: Office notes, hospital records and testing.  ASSESSMENT AND PLAN:  1.  CAD: Her RCA is known to be occluded at the site of a previously placed stent.  Medical therapy - On aspirin, Plavix, and statin, Imdur 90 mg, losartan, and metoprolol. -Despite what all she is going through, I do not think she needs additional antianginals - She will benefit from better blood pressure control. - Emphasized the need to take her medications every day and let us know if she is can have trouble with that  2.  Hypertension: We will increase her Norvasc to 10 mg daily and see if that is enough to get her blood pressure down to a goal of 130/80.  3.  Hyperlipidemia, goal LDL less than 70 - At one point, her LDL was lower than it  was when recently checked. -Encourage compliance with Lipitor -Follow-up lipids and liver function testing in 3 months  4.  Social issues: - She and her husband are under great deal of stress trying to solve their housing situation. - Hopefully, things will get better soon.   Current medicines are reviewed at length with the patient today.  The patient does not have concerns regarding medicines.  The following changes have been made: Increase amlodipine  Labs/ tests ordered today include:  No orders of the defined types were placed in this encounter.    Disposition:   FU with [name], MD  Signed, Rosaria Ferries, PA-C  11/23/2018 4:51 PM    [place] Phone: [phone]; Fax: [phone]

## 2018-11-23 ENCOUNTER — Ambulatory Visit (INDEPENDENT_AMBULATORY_CARE_PROVIDER_SITE_OTHER): Payer: Medicare Other | Admitting: Physician Assistant

## 2018-11-23 ENCOUNTER — Encounter: Payer: Self-pay | Admitting: Physician Assistant

## 2018-11-23 ENCOUNTER — Other Ambulatory Visit: Payer: Self-pay

## 2018-11-23 VITALS — BP 170/101 | HR 68 | Ht 64.0 in | Wt 156.0 lb

## 2018-11-23 DIAGNOSIS — I2510 Atherosclerotic heart disease of native coronary artery without angina pectoris: Secondary | ICD-10-CM | POA: Diagnosis not present

## 2018-11-23 DIAGNOSIS — E785 Hyperlipidemia, unspecified: Secondary | ICD-10-CM

## 2018-11-23 DIAGNOSIS — I10 Essential (primary) hypertension: Secondary | ICD-10-CM

## 2018-11-23 MED ORDER — AMLODIPINE BESYLATE 10 MG PO TABS: 10.0000 mg | ORAL_TABLET | Freq: Every day | ORAL | 6 refills | Status: DC

## 2018-11-23 NOTE — Patient Instructions (Addendum)
Medication Instructions:   Increase Amlodipine to 10mg  daily.   Continue all other medications.    Labwork: none  Testing/Procedures: none  Follow-Up: 3 months   Any Other Special Instructions Will Be Listed Below (If Applicable).  Seek counseling or discuss depression medications with primary doctor.    Call office if symptoms worsen.  If you need a refill on your cardiac medications before your next appointment, please call your pharmacy.

## 2018-12-08 ENCOUNTER — Other Ambulatory Visit (HOSPITAL_COMMUNITY): Payer: Self-pay | Admitting: Internal Medicine

## 2018-12-08 DIAGNOSIS — Z13820 Encounter for screening for osteoporosis: Secondary | ICD-10-CM

## 2018-12-12 ENCOUNTER — Ambulatory Visit (INDEPENDENT_AMBULATORY_CARE_PROVIDER_SITE_OTHER): Payer: Medicare Other

## 2018-12-12 ENCOUNTER — Other Ambulatory Visit: Payer: Self-pay

## 2018-12-12 DIAGNOSIS — R6882 Decreased libido: Secondary | ICD-10-CM

## 2018-12-12 MED ORDER — NONFORMULARY OR COMPOUNDED ITEM
0.2000 mL | Freq: Once | Status: AC
  Administered 2018-12-12: 10:00:00 0.2 mL via INTRAMUSCULAR

## 2018-12-12 MED ORDER — NONFORMULARY OR COMPOUNDED ITEM
0.2000 mL | Status: DC
  Administered 2019-01-09 – 2019-02-21 (×10): 0.2 mL via INTRAMUSCULAR

## 2018-12-12 NOTE — Addendum Note (Signed)
Addended by: Laurence Compton on: 12/12/2018 03:26 PM   Modules accepted: Orders

## 2018-12-12 NOTE — Progress Notes (Signed)
Pt was given 0.2 ml IM of Testosterone to the right thigh. Pt tolerated well;no complaints.

## 2018-12-19 ENCOUNTER — Telehealth (INDEPENDENT_AMBULATORY_CARE_PROVIDER_SITE_OTHER): Payer: Self-pay

## 2018-12-19 ENCOUNTER — Other Ambulatory Visit: Payer: Self-pay

## 2018-12-19 ENCOUNTER — Ambulatory Visit (INDEPENDENT_AMBULATORY_CARE_PROVIDER_SITE_OTHER): Payer: Medicare Other

## 2018-12-19 DIAGNOSIS — R6882 Decreased libido: Secondary | ICD-10-CM

## 2018-12-19 MED ORDER — NONFORMULARY OR COMPOUNDED ITEM
0.2000 mL | Freq: Once | Status: AC
  Administered 2018-12-19: 09:00:00 0.2 mL via INTRAMUSCULAR

## 2018-12-19 NOTE — Progress Notes (Signed)
Pt given 0.77ml to the rt thigh IM. Pt tolerated well; no complaints.

## 2018-12-21 ENCOUNTER — Other Ambulatory Visit (INDEPENDENT_AMBULATORY_CARE_PROVIDER_SITE_OTHER): Payer: Self-pay

## 2018-12-21 DIAGNOSIS — M25552 Pain in left hip: Secondary | ICD-10-CM

## 2018-12-21 DIAGNOSIS — M539 Dorsopathy, unspecified: Secondary | ICD-10-CM

## 2018-12-21 DIAGNOSIS — F439 Reaction to severe stress, unspecified: Secondary | ICD-10-CM

## 2018-12-21 DIAGNOSIS — Z742 Need for assistance at home and no other household member able to render care: Secondary | ICD-10-CM

## 2018-12-21 DIAGNOSIS — G40802 Other epilepsy, not intractable, without status epilepticus: Secondary | ICD-10-CM

## 2018-12-21 DIAGNOSIS — Z993 Dependence on wheelchair: Secondary | ICD-10-CM

## 2018-12-21 DIAGNOSIS — F4329 Adjustment disorder with other symptoms: Secondary | ICD-10-CM

## 2018-12-21 DIAGNOSIS — G8929 Other chronic pain: Secondary | ICD-10-CM

## 2018-12-21 DIAGNOSIS — F4321 Adjustment disorder with depressed mood: Secondary | ICD-10-CM

## 2018-12-21 DIAGNOSIS — M79605 Pain in left leg: Secondary | ICD-10-CM

## 2018-12-21 DIAGNOSIS — H3530 Unspecified macular degeneration: Secondary | ICD-10-CM

## 2018-12-21 NOTE — Progress Notes (Signed)
Pt referral was sent in proficient previously 23 days ago. Pt Cardiologist wanted her seen before her next [facility] visit. Someone called,but did not give her a appointment. Resending to follw through on this referral to be set ASAP.

## 2018-12-21 NOTE — Telephone Encounter (Signed)
Resent referral in Epic today. Previous sent in proficient 23 days ago.

## 2018-12-25 ENCOUNTER — Ambulatory Visit (INDEPENDENT_AMBULATORY_CARE_PROVIDER_SITE_OTHER): Payer: Medicare Other | Admitting: Internal Medicine

## 2018-12-25 ENCOUNTER — Other Ambulatory Visit: Payer: Self-pay

## 2018-12-25 ENCOUNTER — Encounter (INDEPENDENT_AMBULATORY_CARE_PROVIDER_SITE_OTHER): Payer: Self-pay | Admitting: Internal Medicine

## 2018-12-25 ENCOUNTER — Other Ambulatory Visit (HOSPITAL_COMMUNITY): Payer: Medicare Other

## 2018-12-25 ENCOUNTER — Encounter (HOSPITAL_COMMUNITY): Payer: Self-pay

## 2018-12-25 ENCOUNTER — Other Ambulatory Visit (INDEPENDENT_AMBULATORY_CARE_PROVIDER_SITE_OTHER): Payer: Self-pay

## 2018-12-25 VITALS — BP 140/80 | HR 60 | Ht 64.0 in | Wt 157.8 lb

## 2018-12-25 DIAGNOSIS — R6882 Decreased libido: Secondary | ICD-10-CM

## 2018-12-25 DIAGNOSIS — E782 Mixed hyperlipidemia: Secondary | ICD-10-CM | POA: Diagnosis not present

## 2018-12-25 DIAGNOSIS — F439 Reaction to severe stress, unspecified: Secondary | ICD-10-CM

## 2018-12-25 DIAGNOSIS — F411 Generalized anxiety disorder: Secondary | ICD-10-CM

## 2018-12-25 DIAGNOSIS — F4321 Adjustment disorder with depressed mood: Secondary | ICD-10-CM

## 2018-12-25 DIAGNOSIS — Z0000 Encounter for general adult medical examination without abnormal findings: Secondary | ICD-10-CM

## 2018-12-25 DIAGNOSIS — Z853 Personal history of malignant neoplasm of breast: Secondary | ICD-10-CM | POA: Diagnosis not present

## 2018-12-25 DIAGNOSIS — I10 Essential (primary) hypertension: Secondary | ICD-10-CM | POA: Diagnosis not present

## 2018-12-25 DIAGNOSIS — F4329 Adjustment disorder with other symptoms: Secondary | ICD-10-CM

## 2018-12-25 DIAGNOSIS — Z13820 Encounter for screening for osteoporosis: Secondary | ICD-10-CM

## 2018-12-25 DIAGNOSIS — Z742 Need for assistance at home and no other household member able to render care: Secondary | ICD-10-CM

## 2018-12-25 DIAGNOSIS — E559 Vitamin D deficiency, unspecified: Secondary | ICD-10-CM

## 2018-12-25 HISTORY — DX: Decreased libido: R68.82

## 2018-12-25 NOTE — Progress Notes (Signed)
Chief Complaint: This 53 year old lady comes in for an annual physical exam and to address her chronic conditions which are described below. HPI: She has a history of coronary artery disease and is being followed by cardiology.  She has had several stenting procedures and more recently had another cardiac catheterization by Dr. Tamala Julian, cardiologist. She denies any significant recurrent chest pains at the present time. She also has a history of hypertension for which she takes medications. Her blood pressure has been reasonably well-controlled lately. She also has hyperlipidemia and takes statin therapy. She has a lot of anxiety stemming from previous mental issues, and is due to see psychiatry although she is having difficulty getting appointments. She has a previous history of breast cancer and thankfully there is no recurrence.  Past Medical History:  Diagnosis Date  . Anemia   . Anxiety   . Bilateral breast cysts 12/30/2015  . Breast cancer ([place])    Left Breast Cancer  . BV (bacterial vaginosis) 09/09/2015  . Coronary artery disease   . Coronary atherosclerosis of native coronary artery    a. s/p DES to RCA in 2013 b. DES to LAD in 2014 c. cath in 04/2016 showing patent LAD stent with D2 jailed by LAD stent and CTO of RCA with left to right collaterals present  . Cyst of pharynx or nasopharynx    [name]  . Decreased libido 12/25/2018  . Depression   . Essential hypertension   . Falls    x 3-4 in past year  . GERD (gastroesophageal reflux disease)   . History of breast cancer 09/09/2015  . History of hematuria   . Mixed hyperlipidemia   . Personal history of chemotherapy    Left Breast Cancer  . Personal history of radiation therapy    Left Breast Cancer  . Pre-diabetes   . PUD (peptic ulcer disease)   . Seizures ([place])   . Shingles 09/09/2015  . Stroke ([place])    12-2017 on Plavix, only deficit is headaches  . Suicide attempt [facility])    3 attempts in remote  past  . Uterine cancer [facility])      Social History   Social History Narrative   Patient lives at home with her husband.On disability since age 59 yrs.Currently living in a hotel,looking for permanent residence.        Allergies:  Allergies  Allergen Reactions  . Bee Venom Anaphylaxis and Other (See Comments)    "throat swelled and had to the hospital" - Yellow jacket sting  . Penicillins Anaphylaxis and Other (See Comments)    Has patient had a PCN reaction causing immediate rash, facial/tongue/throat swelling, SOB or lightheadedness with hypotension: Yes Has patient had a PCN reaction causing severe rash involving mucus membranes or skin necrosis: Yes Has patient had a PCN reaction that required hospitalization Yes Has patient had a PCN reaction occurring within the last 10 years: Yes If all of the above answers are "NO", then may proceed with Cephalosporin use. Patient states she carries an epi-pen  . Shellfish Allergy Swelling     Current Meds  Medication Sig  . ALPRAZolam (XANAX) 0.5 MG tablet Take 0.5 mg by mouth every morning.   [name] amLODipine (NORVASC) 10 MG tablet Take 1 tablet (10 mg total) by mouth daily.  [name] aspirin EC 81 MG tablet Take 1 tablet (81 mg total) by mouth at bedtime. (Patient taking differently: Take 81 mg by mouth 2 (two) times daily. )  . atorvastatin (  LIPITOR) 40 MG tablet Take 1 tablet (40 mg total) by mouth daily at 6 PM.  . clopidogrel (PLAVIX) 75 MG tablet Take 75 mg by mouth daily.   [name] escitalopram (LEXAPRO) 20 MG tablet Take 20 mg by mouth daily.   . isosorbide mononitrate (IMDUR) 30 MG 24 hr tablet Take 3 tablets (90 mg total) by mouth daily.  [name] LINZESS 145 MCG CAPS capsule Take 145 mcg by mouth daily.   [name] losartan (COZAAR) 50 MG tablet Take 1 tablet (50 mg total) by mouth daily.  . metoprolol tartrate (LOPRESSOR) 50 MG tablet Take 50 mg by mouth 2 (two) times daily.  [name] NITROSTAT 0.4 MG SL tablet PLACE 1 TAB UNDER TONGUE EVERY 5 MIN UP TO 3  TIMES AS NEEDED FOR CHEST PAIN, IF NO RELIEF CALL 911. (Patient taking differently: Place 0.4 mg under the tongue every 5 (five) minutes as needed for chest pain. )  . NP THYROID 90 MG tablet Take 90 mg by mouth daily.  . potassium chloride 20 MEQ/15ML (10%) SOLN Take 15 mLs by mouth daily.   Current Facility-Administered Medications for the 12/25/18 encounter (Office Visit) with [name], MD  Medication  . NONFORMULARY OR COMPOUNDED ITEM 0.2 mL     Nutrition Eats vegetables /fruit but also likes bread also.  Sleep Only 2 hours continuous sleep at night  Exercise Walks 4 miles every day.  Bio-identical hormones Testosterone therapy is being used off label for symptoms of testosterone deficiency and benefits that it produces based on several studies.  These benefits include decreasing body fat, increasing in lean muscle mass and increasing in bone density.  There is improvement of memory, cognition.  There is improvement in exercise tolerance and endurance.  Testosterone therapy has also been shown to be protective against coronary artery disease, cerebrovascular disease, diabetes, hypertension and degenerative joint disease. I have discussed with the patient the FDA warnings regarding testosterone therapy, benefits and side effects and modes of administration as well as monitoring blood levels and side effects  on a regular basis The patient is agreeable that testosterone therapy should be an integral part of his/her wellness,quality of life and prevention of chronic disease.  [id number] from the symptoms mentioned above,there are no other symptoms referable to all systems reviewed.  Physical Exam: Blood pressure 140/80, pulse 60, height 5\' 4"  (1.626 m), weight 157 lb 12.8 oz (71.6 kg). Vitals with BMI 12/25/2018 11/23/2018 10/26/2018  Height 5\' 4"  5\' 4"  -  Weight 157 lbs 13 oz 156 lbs 156 lbs 14 oz  BMI 27.07 [id number]  Systolic [id number]  Diastolic 80 [id number] 85  Pulse 60 68  55      She looks systemically well.  She does not appear to be in any acute discomfort or pain. General: Alert, cooperative, and appears to be the stated age.No pallor.  No jaundice.  No clubbing. Head: Normocephalic Eyes: Sclera white, pupils equal and reactive to light, red reflex x 2,  Ears: Normal bilaterally Oral cavity: Lips, mucosa, and tongue normal: Teeth and gums normal Neck: No adenopathy, supple, symmetrical, trachea midline, and thyroid does not appear enlarged Breast: No masses felt today. Respiratory: Clear to auscultation bilaterally.No wheezing, crackles or bronchial breathing. Cardiovascular: Heart sounds are present and appear to be normal without murmurs or added sounds.  No carotid bruits.  Peripheral pulses are present and equal bilaterally.: Soft, nontender, Gastrointestinal:positive bowel sounds, no hepatosplenomegaly.  No masses felt.No tenderness. Skin: Clear, No rashes noted.No worrisome  skin lesions seen. Neurological: Grossly intact without focal findings, cranial nerves II through XII intact, muscle strength equal bilaterally Musculoskeletal: No acute joint abnormalities noted.Full range of movement noted with joints. Psychiatric: Affect appropriate, non-anxious.    Assessment  1. Essential hypertension, benign   2. Mixed hyperlipidemia   3. Anxiety state   4. History of breast cancer   5. Decreased libido   6. Screening for osteoporosis   7. Vitamin D deficiency disease   8. Examination for, medical, general       Plan  1. She will continue with all medications for chronic conditions above. 2. She is up-to-date with most screening/health maintenance procedures but I think she does require a bone density scan as she is in the menopause, surgically induced because she had a total abdominal hysterectomy and by salpingo-oophorectomy in the past.  I will order this now. 3. Further recommendations will depend on blood work and I will see her in about 3  months time for follow-up. 4. Today, in addition to a preventative visit, I performed an office visit to address her chronic conditions above.      Tests Ordered:   Orders Placed This Encounter  Procedures  . DG Bone Density  . T3, free  . TSH  . VITAMIN D 25 Hydroxy (Vit-D Deficiency, Fractures)  . COMPLETE METABOLIC PANEL WITH GFR     Jaycelynn Knickerbocker C Nel Stoneking   12/25/2018, 1:41 PM

## 2018-12-25 NOTE — Progress Notes (Signed)
Pt came into office today for Annual Exam. Pt stated she still has not been contacted for the Behavior health visit at this time. Pt stated she still needs appointment. ASAP.

## 2018-12-26 ENCOUNTER — Ambulatory Visit (INDEPENDENT_AMBULATORY_CARE_PROVIDER_SITE_OTHER): Payer: Medicare Other

## 2018-12-26 DIAGNOSIS — R6882 Decreased libido: Secondary | ICD-10-CM

## 2018-12-26 LAB — COMPLETE METABOLIC PANEL WITH GFR
AG Ratio: 1.4 (calc) (ref 1.0–2.5)
ALT: 19 U/L (ref 6–29)
AST: 21 U/L (ref 10–35)
Albumin: 4.3 g/dL (ref 3.6–5.1)
Alkaline phosphatase (APISO): 153 U/L (ref 37–153)
BUN: 10 mg/dL (ref 7–25)
CO2: 24 mmol/L (ref 20–32)
Calcium: 9.9 mg/dL (ref 8.6–10.4)
Chloride: 106 mmol/L (ref 98–110)
Creat: 0.95 mg/dL (ref 0.50–1.05)
GFR, Est African American: 80 mL/min/{1.73_m2} (ref >=60)
GFR, Est Non African American: 69 mL/min/{1.73_m2} (ref >=60)
Globulin: 3.1 g/dL (calc) (ref 1.9–3.7)
Glucose, Bld: 105 mg/dL — ABNORMAL HIGH (ref 65–99)
Potassium: 3.7 mmol/L (ref 3.5–5.3)
Sodium: 141 mmol/L (ref 135–146)
Total Bilirubin: 0.4 mg/dL (ref 0.2–1.2)
Total Protein: 7.4 g/dL (ref 6.1–8.1)

## 2018-12-26 LAB — VITAMIN D 25 HYDROXY (VIT D DEFICIENCY, FRACTURES): Vit D, 25-Hydroxy: 21 ng/mL — ABNORMAL LOW (ref 30–100)

## 2018-12-26 LAB — T3, FREE: T3, Free: 3.2 pg/mL (ref 2.3–4.2)

## 2018-12-26 LAB — TSH: TSH: 3.21 m[IU]/L

## 2018-12-26 MED ORDER — NONFORMULARY OR COMPOUNDED ITEM: 0.2000 mL | Freq: Once | Status: DC

## 2018-12-26 NOTE — Progress Notes (Signed)
Please call this patient and tell her that her vitamin D levels are extremely low and that she needs to start taking vitamin D3 10,000 units daily which she can get over-the-counter.  Thanks.

## 2018-12-26 NOTE — Progress Notes (Signed)
Pt Given testosterone injection 0.40mml to the left thigh. Pt tolerate well. Pt waited 5 mins.

## 2018-12-27 ENCOUNTER — Telehealth (INDEPENDENT_AMBULATORY_CARE_PROVIDER_SITE_OTHER): Payer: Self-pay

## 2018-12-27 NOTE — Telephone Encounter (Signed)
-----   Message from [name], MD sent at 12/26/2018 12:12 PM EDT ----- Please call this patient and tell her that her vitamin D levels are extremely low and that she needs to start taking vitamin D3 10,000 units daily which she can get over-the-counter.  Thanks.

## 2018-12-27 NOTE — Telephone Encounter (Signed)
Day 2 attempt to call to give lab results & instructions. Pt Did come in to office on Tues for weekly injection. But labs were not ready to release at the time.

## 2019-01-02 ENCOUNTER — Ambulatory Visit (INDEPENDENT_AMBULATORY_CARE_PROVIDER_SITE_OTHER): Payer: Medicare Other

## 2019-01-02 ENCOUNTER — Other Ambulatory Visit (INDEPENDENT_AMBULATORY_CARE_PROVIDER_SITE_OTHER): Payer: Self-pay | Admitting: Internal Medicine

## 2019-01-02 ENCOUNTER — Other Ambulatory Visit: Payer: Self-pay

## 2019-01-02 ENCOUNTER — Other Ambulatory Visit (INDEPENDENT_AMBULATORY_CARE_PROVIDER_SITE_OTHER): Payer: Self-pay

## 2019-01-02 ENCOUNTER — Encounter (INDEPENDENT_AMBULATORY_CARE_PROVIDER_SITE_OTHER): Payer: Self-pay

## 2019-01-02 VITALS — HR 118 | Temp 97.1°F

## 2019-01-02 DIAGNOSIS — R6882 Decreased libido: Secondary | ICD-10-CM

## 2019-01-02 MED ORDER — NONFORMULARY OR COMPOUNDED ITEM: 5.0000 mg | 3 refills | Status: DC

## 2019-01-02 NOTE — Telephone Encounter (Signed)
[facility].

## 2019-01-02 NOTE — Progress Notes (Signed)
Pt given Testosterone injection 0.2 ml IM to the right thigh. Pt tolerated well no complaints.

## 2019-01-02 NOTE — Telephone Encounter (Signed)
I thought she was getting her [facility]?.  This prescription seems to come from [place]?

## 2019-01-09 ENCOUNTER — Ambulatory Visit (INDEPENDENT_AMBULATORY_CARE_PROVIDER_SITE_OTHER): Payer: Medicare Other

## 2019-01-09 ENCOUNTER — Other Ambulatory Visit: Payer: Self-pay

## 2019-01-09 DIAGNOSIS — I10 Essential (primary) hypertension: Secondary | ICD-10-CM

## 2019-01-11 ENCOUNTER — Ambulatory Visit (HOSPITAL_COMMUNITY): Payer: Medicare Other | Admitting: Licensed Clinical Social Worker

## 2019-01-11 ENCOUNTER — Other Ambulatory Visit: Payer: Self-pay

## 2019-01-15 ENCOUNTER — Ambulatory Visit (INDEPENDENT_AMBULATORY_CARE_PROVIDER_SITE_OTHER): Payer: Medicare Other

## 2019-01-15 ENCOUNTER — Other Ambulatory Visit: Payer: Self-pay

## 2019-01-15 ENCOUNTER — Telehealth (INDEPENDENT_AMBULATORY_CARE_PROVIDER_SITE_OTHER): Payer: Self-pay

## 2019-01-15 DIAGNOSIS — R6882 Decreased libido: Secondary | ICD-10-CM

## 2019-01-15 NOTE — Telephone Encounter (Signed)
"  Appt scheduled w/LCSW Glori Bickers on 01/11/19 at 3 PM Virtually. I cannot move this here since it is a [place] referral and the pt is seeing a provider out of our [place] office. This was also loaded in Epic 2 times, one was closed as a duplicate and we documented in the other. Epic referral [phone] & [id number]" [place] at [facility] said 18 days ago  "Rejection Reason - Patient was No Show - Pt no showed on 01/11/19 at 3 PM appt -virtual " [place] at [facility] said 3 days ago  **This is from proficient site: [place] visits.**

## 2019-01-16 ENCOUNTER — Ambulatory Visit (INDEPENDENT_AMBULATORY_CARE_PROVIDER_SITE_OTHER): Payer: Medicare Other

## 2019-01-23 ENCOUNTER — Other Ambulatory Visit: Payer: Self-pay

## 2019-01-23 ENCOUNTER — Ambulatory Visit (INDEPENDENT_AMBULATORY_CARE_PROVIDER_SITE_OTHER): Payer: Medicare Other

## 2019-01-23 DIAGNOSIS — R6882 Decreased libido: Secondary | ICD-10-CM

## 2019-01-23 NOTE — Progress Notes (Signed)
Pt was given given in the left thigh. Pt tolerated well. No complaints.

## 2019-01-30 ENCOUNTER — Other Ambulatory Visit: Payer: Self-pay

## 2019-01-30 ENCOUNTER — Ambulatory Visit (INDEPENDENT_AMBULATORY_CARE_PROVIDER_SITE_OTHER): Payer: Medicare Other

## 2019-01-30 DIAGNOSIS — R6882 Decreased libido: Secondary | ICD-10-CM

## 2019-01-30 NOTE — Progress Notes (Signed)
Pt given the 0.36ml to the right thigh. Pt tolerated well. No complaints.

## 2019-02-06 ENCOUNTER — Other Ambulatory Visit: Payer: Self-pay

## 2019-02-06 ENCOUNTER — Ambulatory Visit (INDEPENDENT_AMBULATORY_CARE_PROVIDER_SITE_OTHER): Payer: Medicare Other

## 2019-02-06 DIAGNOSIS — R6882 Decreased libido: Secondary | ICD-10-CM

## 2019-02-06 NOTE — Progress Notes (Signed)
Pt was given 0.2 ml  IM to the left thigh. Pt tolerated well.

## 2019-02-14 ENCOUNTER — Ambulatory Visit (INDEPENDENT_AMBULATORY_CARE_PROVIDER_SITE_OTHER): Payer: Medicare Other

## 2019-02-14 ENCOUNTER — Other Ambulatory Visit: Payer: Self-pay

## 2019-02-14 DIAGNOSIS — R6882 Decreased libido: Secondary | ICD-10-CM

## 2019-02-14 NOTE — Progress Notes (Signed)
Pt tolerated well; no complaints.

## 2019-02-20 ENCOUNTER — Ambulatory Visit (INDEPENDENT_AMBULATORY_CARE_PROVIDER_SITE_OTHER): Payer: Medicare Other

## 2019-02-21 ENCOUNTER — Ambulatory Visit (INDEPENDENT_AMBULATORY_CARE_PROVIDER_SITE_OTHER): Payer: Medicare Other | Admitting: Nurse Practitioner

## 2019-02-21 ENCOUNTER — Ambulatory Visit (INDEPENDENT_AMBULATORY_CARE_PROVIDER_SITE_OTHER): Payer: Medicare Other | Admitting: Internal Medicine

## 2019-02-21 ENCOUNTER — Encounter (INDEPENDENT_AMBULATORY_CARE_PROVIDER_SITE_OTHER): Payer: Self-pay | Admitting: Internal Medicine

## 2019-02-21 ENCOUNTER — Other Ambulatory Visit: Payer: Self-pay

## 2019-02-21 VITALS — BP 146/100 | HR 79 | Temp 97.9°F | Resp 18 | Ht 64.0 in | Wt 160.2 lb

## 2019-02-21 DIAGNOSIS — M7989 Other specified soft tissue disorders: Secondary | ICD-10-CM

## 2019-02-21 DIAGNOSIS — L03113 Cellulitis of right upper limb: Secondary | ICD-10-CM

## 2019-02-21 DIAGNOSIS — E559 Vitamin D deficiency, unspecified: Secondary | ICD-10-CM

## 2019-02-21 MED ORDER — LEVOFLOXACIN 500 MG PO TABS: 500.0000 mg | ORAL_TABLET | Freq: Every day | ORAL | 0 refills | Status: DC

## 2019-02-21 NOTE — Progress Notes (Signed)
Pt given 0.24ml IM to the lt thigh. Pt tolerated well.

## 2019-02-21 NOTE — Progress Notes (Signed)
Metrics: Intervention Frequency ACO  Documented Smoking Status Yearly  Screened one or more times in 24 months  Cessation Counseling or  Active cessation medication Past 24 months  Past 24 months   Guideline developer: UpToDate (See UpToDate for funding source) Date Released: 2014       Wellness Office Visit  Subjective:  Patient ID: Bethany Wang, female    DOB: 1966/03/01  Age: 53 y.o. MRN: [id number]  CC: Right arm swelling and pain HPI  This lady comes in for an acute visit with the above symptoms which have been present for the last 3 to 4 days.  She denies any injury to the right arm.  She denies any insect bite.  However, the right upper arm has become swollen and somewhat red according to her.  There was some kind of lump in her right antecubital fossa but this seems to have resolved.  She denies feeling feverish. She also had a history of vitamin D deficiency and has been taking vitamin D3 supplementation 10,000 units daily. Past Medical History:  Diagnosis Date  . Anemia   . Anxiety   . Bilateral breast cysts 12/30/2015  . Breast cancer ([place])    Left Breast Cancer  . BV (bacterial vaginosis) 09/09/2015  . Coronary artery disease   . Coronary atherosclerosis of native coronary artery    a. s/p DES to RCA in 2013 b. DES to LAD in 2014 c. cath in 04/2016 showing patent LAD stent with D2 jailed by LAD stent and CTO of RCA with left to right collaterals present  . Cyst of pharynx or nasopharynx    [name]  . Decreased libido 12/25/2018  . Depression   . Essential hypertension   . Falls    x 3-4 in past year  . GERD (gastroesophageal reflux disease)   . History of breast cancer 09/09/2015  . History of hematuria   . Mixed hyperlipidemia   . Personal history of chemotherapy    Left Breast Cancer  . Personal history of radiation therapy    Left Breast Cancer  . Pre-diabetes   . PUD (peptic ulcer disease)   . Seizures ([place])   . Shingles 09/09/2015   . Stroke ([place])    12-2017 on Plavix, only deficit is headaches  . Suicide attempt ([place])    3 attempts in remote past  . Uterine cancer ([place])       Family History  Problem Relation Age of Onset  . Colon cancer Paternal Grandfather   . Cancer Father   . Cancer Maternal Uncle   . Alzheimer's disease Paternal Aunt   . Cirrhosis Maternal Uncle   . Cancer Paternal Aunt        lung  . Heart disease Brother   . Heart attack Brother   . Mental illness Daughter   . ADD / ADHD Daughter   . Bipolar disorder Daughter   . Mental illness Son   . ADD / ADHD Son   . Bipolar disorder Son   . Mental illness Son   . ADD / ADHD Son   . Bipolar disorder Son     Social History   Social History Narrative   Patient lives at home with her husband.On disability since age 2 yrs.Currently living in a hotel,looking for permanent residence.   Social History   Tobacco Use  . Smoking status: Former Smoker    Packs/day: 0.20    Years: 32.00    Pack years: 6.40  Types: Cigarettes, Cigars    Start date: 07/05/1983    Quit date: 04/03/2012    Years since quitting: 6.8  . Smokeless tobacco: Never Used  Substance Use Topics  . Alcohol use: No    Alcohol/week: 0.0 standard drinks    Current Meds  Medication Sig  . ALPRAZolam (XANAX) 0.5 MG tablet Take 0.5 mg by mouth every morning.   [name] amLODipine (NORVASC) 10 MG tablet Take 1 tablet (10 mg total) by mouth daily.  [name] aspirin EC 81 MG tablet Take 1 tablet (81 mg total) by mouth at bedtime. (Patient taking differently: Take 81 mg by mouth 2 (two) times daily. )  . atorvastatin (LIPITOR) 40 MG tablet Take 1 tablet (40 mg total) by mouth daily at 6 PM.  . clopidogrel (PLAVIX) 75 MG tablet Take 75 mg by mouth daily.   [name] EPINEPHrine 0.3 mg/0.3 mL IJ SOAJ injection Inject 0.3 mg into the muscle once.   . escitalopram (LEXAPRO) 20 MG tablet Take 20 mg by mouth daily.   . isosorbide mononitrate (IMDUR) 30 MG 24 hr tablet Take 3 tablets (90 mg total) by  mouth daily.  [name] LINZESS 145 MCG CAPS capsule Take 145 mcg by mouth daily.   [name] losartan (COZAAR) 50 MG tablet Take 1 tablet (50 mg total) by mouth daily.  . metoprolol tartrate (LOPRESSOR) 50 MG tablet Take 50 mg by mouth 2 (two) times daily.  [name] NITROSTAT 0.4 MG SL tablet PLACE 1 TAB UNDER TONGUE EVERY 5 MIN UP TO 3 TIMES AS NEEDED FOR CHEST PAIN, IF NO RELIEF CALL 911. (Patient taking differently: Place 0.4 mg under the tongue every 5 (five) minutes as needed for chest pain. )  . NONFORMULARY OR COMPOUNDED ITEM Inject 5 mg into the muscle every 7 (seven) days. Testosterone cypionate in olive  oil (25 mg/mL).  Dispense 5 mL vial.  . NP THYROID 90 MG tablet Take 90 mg by mouth daily.  . potassium chloride 20 MEQ/15ML (10%) SOLN Take 15 mLs by mouth daily.       Objective:   Today's Vitals: BP (!) 146/100 (BP Location: Left Arm, Patient Position: Sitting, Cuff Size: Normal)   Pulse 79   Temp 97.9 F (36.6 C) (Temporal)   Resp 18   Ht _0  (1.626 m)   Wt 160 lb 3.2 oz (72.7 kg)   SpO2 96% Comment: with mask on in office.  BMI 27.50 kg/m  Vitals with BMI 02/21/2019 01/02/2019 12/25/2018  Height _1  - _2   Weight 160 lbs 3 oz - 157 lbs 13 oz  BMI 27.06 - 23.76  Systolic 283 - 151  Diastolic 761 - 80  Pulse 79 118 60     Physical Exam   Examination of the right arm does show swelling compared to the left side.  I can possibly appreciate some  erythema on this side also.    Assessment   1. Arm swelling   2. Cellulitis of right upper extremity   3. Vitamin D deficiency disease       Tests ordered Orders Placed This Encounter  Procedures  . Vitamin D, 25-hydroxy  . CMP with eGFR([facility])  . D-dimer, Quantitative     Plan: 1. Blood work is ordered above to see if there is any evidence of thromboembolic disease. 2. In the meantime I will treat her with Levaquin for probable cellulitis.   Meds ordered this encounter  Medications  . levofloxacin (LEVAQUIN) 500 MG  tablet  Sig: Take 1 tablet (500 mg total) by mouth daily.    Dispense:  7 tablet    Refill:  0    Zena Vitelli Luther Parody, MD

## 2019-02-22 ENCOUNTER — Telehealth (INDEPENDENT_AMBULATORY_CARE_PROVIDER_SITE_OTHER): Payer: Self-pay

## 2019-02-22 LAB — COMPLETE METABOLIC PANEL WITH GFR
AG Ratio: 1.9 (calc) (ref 1.0–2.5)
ALT: 22 U/L (ref 6–29)
AST: 30 U/L (ref 10–35)
Albumin: 4.5 g/dL (ref 3.6–5.1)
Alkaline phosphatase (APISO): 130 U/L (ref 37–153)
BUN: 7 mg/dL (ref 7–25)
CO2: 26 mmol/L (ref 20–32)
Calcium: 9.8 mg/dL (ref 8.6–10.4)
Chloride: 104 mmol/L (ref 98–110)
Creat: 0.83 mg/dL (ref 0.50–1.05)
GFR, Est African American: 94 mL/min/{1.73_m2} (ref >=60)
GFR, Est Non African American: 81 mL/min/{1.73_m2} (ref >=60)
Globulin: 2.4 g/dL (calc) (ref 1.9–3.7)
Glucose, Bld: 89 mg/dL (ref 65–99)
Potassium: 4.6 mmol/L (ref 3.5–5.3)
Sodium: 140 mmol/L (ref 135–146)
Total Bilirubin: 0.3 mg/dL (ref 0.2–1.2)
Total Protein: 6.9 g/dL (ref 6.1–8.1)

## 2019-02-22 LAB — D-DIMER, QUANTITATIVE: D-Dimer, Quant: 0.49 mcg/mL FEU (ref <0.50)

## 2019-02-22 LAB — VITAMIN D 25 HYDROXY (VIT D DEFICIENCY, FRACTURES): Vit D, 25-Hydroxy: 23 ng/mL — ABNORMAL LOW (ref 30–100)

## 2019-02-22 NOTE — Progress Notes (Signed)
Patient called.

## 2019-02-22 NOTE — Progress Notes (Signed)
Pt called and given instruction of lab results. Pt pick up medication to take she stated. Will increase the Vitamin D3 to 15,000 ui /day. She stated she drinks a great deal of fluids; she thinks it is washing it out her system before it can absorb.

## 2019-02-22 NOTE — Telephone Encounter (Signed)
-----   Message from [name], MD sent at 02/22/2019  9:08 AM EST ----- Please call patient and let her know that the blood test we did for possible blood clot in her arm is negative and there is no need to do a scan at the present time.  So I think that she likely has infection and the antibiotic should help her.  If they do not, she needs to see [place] again next week. Also her vitamin D levels are very low, so she should be taking vitamin D3 10,000 units daily. Follow-up as scheduled.

## 2019-02-27 ENCOUNTER — Telehealth: Payer: Self-pay | Admitting: Cardiology

## 2019-02-27 ENCOUNTER — Other Ambulatory Visit (INDEPENDENT_AMBULATORY_CARE_PROVIDER_SITE_OTHER): Payer: Self-pay | Admitting: Internal Medicine

## 2019-02-27 ENCOUNTER — Encounter: Payer: Self-pay | Admitting: Cardiology

## 2019-02-27 ENCOUNTER — Other Ambulatory Visit (HOSPITAL_COMMUNITY): Payer: Self-pay | Admitting: Internal Medicine

## 2019-02-27 ENCOUNTER — Other Ambulatory Visit: Payer: Self-pay

## 2019-02-27 ENCOUNTER — Ambulatory Visit (INDEPENDENT_AMBULATORY_CARE_PROVIDER_SITE_OTHER): Payer: Medicare Other

## 2019-02-27 ENCOUNTER — Telehealth (INDEPENDENT_AMBULATORY_CARE_PROVIDER_SITE_OTHER): Payer: Medicare Other | Admitting: Family Medicine

## 2019-02-27 VITALS — BP 146/90 | HR 101 | Temp 98.2°F | Ht 64.0 in | Wt 160.0 lb

## 2019-02-27 VITALS — BP 146/100 | HR 79 | Ht 64.0 in | Wt 160.0 lb

## 2019-02-27 DIAGNOSIS — I10 Essential (primary) hypertension: Secondary | ICD-10-CM

## 2019-02-27 DIAGNOSIS — E785 Hyperlipidemia, unspecified: Secondary | ICD-10-CM

## 2019-02-27 DIAGNOSIS — R002 Palpitations: Secondary | ICD-10-CM

## 2019-02-27 DIAGNOSIS — R6882 Decreased libido: Secondary | ICD-10-CM

## 2019-02-27 DIAGNOSIS — I2583 Coronary atherosclerosis due to lipid rich plaque: Secondary | ICD-10-CM | POA: Diagnosis not present

## 2019-02-27 DIAGNOSIS — I2510 Atherosclerotic heart disease of native coronary artery without angina pectoris: Secondary | ICD-10-CM

## 2019-02-27 DIAGNOSIS — Z1231 Encounter for screening mammogram for malignant neoplasm of breast: Secondary | ICD-10-CM

## 2019-02-27 MED ORDER — NONFORMULARY OR COMPOUNDED ITEM
0.2000 mL | Status: DC
  Administered 2019-03-06 – 2019-07-03 (×14): 0.2 mL via INTRAMUSCULAR

## 2019-02-27 MED ORDER — DOXYCYCLINE HYCLATE 100 MG PO TABS: 100.0000 mg | ORAL_TABLET | Freq: Two times a day (BID) | ORAL | 0 refills | Status: DC

## 2019-02-27 NOTE — Telephone Encounter (Signed)
Virtual Visit Pre-Appointment Phone Call  "(Name), I am calling you today to discuss your upcoming appointment. We are currently trying to limit exposure to the virus that causes COVID-19 by seeing patients at home rather than in the office."  1. "What is the BEST phone number to call the day of the visit?" - include this in appointment notes  2. Do you have or have access to (through a family member/friend) a smartphone with video capability that we can use for your visit?" a. If yes - list this number in appt notes as cell (if different from BEST phone #) and list the appointment type as a VIDEO visit in appointment notes b. If no - list the appointment type as a PHONE visit in appointment notes  3. Confirm consent - "In the setting of the current Covid19 crisis, you are scheduled for a (phone or video) visit with your provider on (date) at (time).  Just as we do with many in-office visits, in order for you to participate in this visit, we must obtain consent.  If you'd like, I can send this to your mychart (if signed up) or email for you to review.  Otherwise, I can obtain your verbal consent now.  All virtual visits are billed to your insurance company just like a normal visit would be.  By agreeing to a virtual visit, we'd like you to understand that the technology does not allow for your provider to perform an examination, and thus may limit your provider's ability to fully assess your condition. If your provider identifies any concerns that need to be evaluated in person, we will make arrangements to do so.  Finally, though the technology is pretty good, we cannot assure that it will always work on either your or our end, and in the setting of a video visit, we may have to convert it to a phone-only visit.  In either situation, we cannot ensure that we have a secure connection.  Are you willing to proceed?" STAFF: Did the patient verbally acknowledge consent to telehealth visit? Document  YES/NO here: yes  4. Advise patient to be prepared - "Two hours prior to your appointment, go ahead and check your blood pressure, pulse, oxygen saturation, and your weight (if you have the equipment to check those) and write them all down. When your visit starts, your provider will ask you for this information. If you have an Apple Watch or Kardia device, please plan to have heart rate information ready on the day of your appointment. Please have a pen and paper handy nearby the day of the visit as well."  5. Give patient instructions for MyChart download to smartphone OR Doximity/Doxy.me as below if video visit (depending on what platform provider is using)  6. Inform patient they will receive a phone call 15 minutes prior to their appointment time (may be from unknown caller ID) so they should be prepared to answer    TELEPHONE CALL NOTE  LESVIA CHURCH has been deemed a candidate for a follow-up tele-health visit to limit community exposure during the Covid-19 pandemic. I spoke with the patient via phone to ensure availability of phone/video source, confirm preferred email & phone number, and discuss instructions and expectations.  I reminded GABY THORNER to be prepared with any vital sign and/or heart rhythm information that could potentially be obtained via home monitoring, at the time of her visit. I reminded RINDI MENESES to expect a phone call prior to  her visit.  Weston Anna 02/27/2019 8:44 AM   INSTRUCTIONS FOR DOWNLOADING THE MYCHART APP TO SMARTPHONE  - The patient must first make sure to have activated MyChart and know their login information - If Apple, go to [employer] and type in MyChart in the search bar and download the app. If Android, ask patient to go to [employer] and type in [place] in the search bar and download the app. The app is free but as with any other app downloads, their phone may require them to verify saved payment information or  Apple/Android password.  - The patient will need to then log into the app with their MyChart username and password, and select [facility] as their healthcare provider to link the account. When it is time for your visit, go to the MyChart app, find appointments, and click Begin Video Visit. Be sure to Select Allow for your device to access the Microphone and Camera for your visit. You will then be connected, and your provider will be with you shortly.  **If they have any issues connecting, or need assistance please contact MyChart service desk [phone])**  **If using a computer, in order to ensure the best quality for their visit they will need to use either of the following Internet Browsers: [employer], or Google Chrome**  IF USING DOXIMITY or DOXY.ME - The patient will receive a link just prior to their visit by text.     FULL LENGTH CONSENT FOR TELE-HEALTH VISIT   I hereby voluntarily request, consent and authorize  [facility] and its employed or contracted physicians, physician assistants, nurse practitioners or other licensed health care professionals (the Practitioner), to provide me with telemedicine health care services (the Services") as deemed necessary by the treating Practitioner. I acknowledge and consent to receive the Services by the Practitioner via telemedicine. I understand that the telemedicine visit will involve communicating with the Practitioner through live audiovisual communication technology and the disclosure of certain medical information by electronic transmission. I acknowledge that I have been given the opportunity to request an in-person assessment or other available alternative prior to the telemedicine visit and am voluntarily participating in the telemedicine visit.  I understand that I have the right to withhold or withdraw my consent to the use of telemedicine in the course of my care at any time, without affecting my right to future care  or treatment, and that the Practitioner or I may terminate the telemedicine visit at any time. I understand that I have the right to inspect all information obtained and/or recorded in the course of the telemedicine visit and may receive copies of available information for a reasonable fee.  I understand that some of the potential risks of receiving the Services via telemedicine include:   Delay or interruption in medical evaluation due to technological equipment failure or disruption;  Information transmitted may not be sufficient (e.g. poor resolution of images) to allow for appropriate medical decision making by the Practitioner; and/or   In rare instances, security protocols could fail, causing a breach of personal health information.  Furthermore, I acknowledge that it is my responsibility to provide information about my medical history, conditions and care that is complete and accurate to the best of my ability. I acknowledge that Practitioner's advice, recommendations, and/or decision may be based on factors not within their control, such as incomplete or inaccurate data provided by me or distortions of diagnostic images or specimens that may result from electronic transmissions. I  understand that the practice of medicine is not an exact science and that Practitioner makes no warranties or guarantees regarding treatment outcomes. I acknowledge that I will receive a copy of this consent concurrently upon execution via email to the email address I last provided but may also request a printed copy by calling the office of [facility].    I understand that my insurance will be billed for this visit.   I have read or had this consent read to me.  I understand the contents of this consent, which adequately explains the benefits and risks of the Services being provided via telemedicine.   I have been provided ample opportunity to ask questions regarding this consent and the Services and have had  my questions answered to my satisfaction.  I give my informed consent for the services to be provided through the use of telemedicine in my medical care  By participating in this telemedicine visit I agree to the above.

## 2019-02-27 NOTE — Patient Instructions (Addendum)
Medication Instructions:   Your physician recommends that you continue on your current medications as directed. Please refer to the Current Medication list given to you today.  Labwork:  NONE  Testing/Procedures:  NONE  Follow-Up:  Your physician recommends that you schedule a follow-up appointment in: 9 months. You will receive a reminder letter in the mail in about 4-6 months reminding you to call and schedule your appointment. If you don't receive this letter, please contact our office.  Any Other Special Instructions Will Be Listed Below (If Applicable).  If you need a refill on your cardiac medications before your next appointment, please call your pharmacy.

## 2019-02-27 NOTE — Progress Notes (Signed)
Virtual Visit via Telephone Note   This visit type was conducted due to national recommendations for restrictions regarding the COVID-19 Pandemic (e.g. social distancing) in an effort to limit this patient's exposure and mitigate transmission in our community.  Due to her co-morbid illnesses, this patient is at least at moderate risk for complications without adequate follow up.  This format is felt to be most appropriate for this patient at this time.  The patient did not have access to video technology/had technical difficulties with video requiring transitioning to audio format only (telephone).  All issues noted in this document were discussed and addressed.  No physical exam could be performed with this format.  Please refer to the patient's chart for her  consent to telehealth for [facility].   Date:  02/27/2019   ID:  Bethany Wang, DOB 06-May-1965, MRN [id number]  Patient Location: Home Provider Location: Office  PCP:  [name], MD  Cardiologist:  [name], MD  Electrophysiologist:  None   Evaluation Performed:  Follow-Up Visit  Chief Complaint: Follow up HTN, CAD, Syncope, Palpitations  History of Present Illness:    Bethany Wang is a 53 y.o. female with history of hypertension, coronary artery disease, syncope, palpitations.  Patient is having multiple somatic complaints.  She is under a lot of stress due to social and economic situation.  She currently states she is homeless living with her daughter.  She complains of chest pressure with and without exertion.  States that occasionally radiates to her neck or arm.  She denies any other associated symptoms such as nausea, vomiting, or diaphoresis.  She recently had a cardiac catheterization in July 2020 which showed significant underlying two-vessel coronary artery disease with widely patent the stent with minimal restenosis.  Chronically occluded RCA stent with well-developed left to right collaterals.   A jailed diagonal has a stable 70% ostial stenosis.  Moderate obtuse marginal 1 disease.  Ejection fraction was normal by echo.  Aggressive medical therapy was recommended.   The patient does not have symptoms concerning for COVID-19 infection (fever, chills, cough, or new shortness of breath).    Past Medical History:  Diagnosis Date   Anemia    Anxiety    Bilateral breast cysts 12/30/2015   Breast cancer ([place])    Left Breast Cancer   BV (bacterial vaginosis) 09/09/2015   Coronary artery disease    Coronary atherosclerosis of native coronary artery    a. s/p DES to RCA in 2013 b. DES to LAD in 2014 c. cath in 04/2016 showing patent LAD stent with D2 jailed by LAD stent and CTO of RCA with left to right collaterals present   Cyst of pharynx or nasopharynx    [name]   Decreased libido 12/25/2018   Depression    Essential hypertension    Falls    x 3-4 in past year   GERD (gastroesophageal reflux disease)    History of breast cancer 09/09/2015   History of hematuria    Mixed hyperlipidemia    Personal history of chemotherapy    Left Breast Cancer   Personal history of radiation therapy    Left Breast Cancer   Pre-diabetes    PUD (peptic ulcer disease)    Seizures ([place])    Shingles 09/09/2015   Stroke ([place])    12-2017 on Plavix, only deficit is headaches   Suicide attempt ([place])    3 attempts in remote past   Uterine cancer ([place])  Past Surgical History:  Procedure Laterality Date   ABDOMINAL HYSTERECTOMY     BLADDER SURGERY     BREAST LUMPECTOMY Left    BREAST LUMPECTOMY Right 02/24/2018   Procedure: RIGHT BREAST LUMPECTOMY ERAS PATHWAY;  Surgeon: Jovita Kussmaul, MD;  Location: [facility];  Service: General;  Laterality: Right;   CARDIAC CATHETERIZATION N/A 05/10/2016   Procedure: Left Heart Cath and Coronary Angiography;  Surgeon: Belva Crome, MD;  Location: [facility];  Service: Cardiovascular;  Laterality: N/A;    COLONOSCOPY  May 2010   Fle[place]man: normal rectum, internal hemorrhoids, , benign colonic polyp   COLONOSCOPY N/A 01/05/2017   Procedure: COLONOSCOPY;  Surgeon: Rogene Houston, MD;  [facility];  Service: Endoscopy;  Laterality: N/A;  1:00   ESOPHAGOGASTRODUODENOSCOPY     2001 Dr. Amedeo Plenty: distal esophagitis, small hiatal hernia,. Dr. Tamala Julian 2006? no records available currently, pt also reports  EGD a few years ago with Dr. Gala Romney, do not have these reports anywhere in medical records   ESOPHAGOGASTRODUODENOSCOPY  06/02/2011   [id number] pill impaction as described above s/p dilation of a probable cervical esophageal web/bx abnormal esophageal and gastric mucosa. + H.pylori gastritis    EYE SURGERY     Removed glass   HAND SURGERY Right    Left breast lumpectomy     Benign   LEFT HEART CATH AND CORONARY ANGIOGRAPHY N/A 10/25/2018   Procedure: LEFT HEART CATH AND CORONARY ANGIOGRAPHY;  Surgeon: Wellington Hampshire, MD;  Location: [facility];  Service: Cardiovascular;  Laterality: N/A;   LEFT HEART CATHETERIZATION WITH CORONARY ANGIOGRAM N/A 07/03/2014   Procedure: LEFT HEART CATHETERIZATION WITH CORONARY ANGIOGRAM;  Surgeon: Belva Crome, MD;  Location: [facility];  Service: Cardiovascular;  Laterality: N/A;   POLYPECTOMY  01/05/2017   Procedure: POLYPECTOMY;  Surgeon: Rogene Houston, MD;  [facility];  Service: Endoscopy;;  colon   SHOULDER SURGERY Left      Current Meds  Medication Sig   ALPRAZolam (XANAX) 0.5 MG tablet Take 0.5 mg by mouth every morning.    amLODipine (NORVASC) 10 MG tablet Take 1 tablet (10 mg total) by mouth daily.   aspirin EC 81 MG tablet Take 162 mg by mouth 2 (two) times daily.   atorvastatin (LIPITOR) 40 MG tablet Take 1 tablet (40 mg total) by mouth daily at 6 PM.   clopidogrel (PLAVIX) 75 MG tablet Take 75 mg by mouth daily.    doxycycline (VIBRA-TABS) 100 MG tablet Take 1 tablet (100 mg total) by mouth 2 (two) times  daily.   EPINEPHrine 0.3 mg/0.3 mL IJ SOAJ injection Inject 0.3 mg into the muscle once.    escitalopram (LEXAPRO) 20 MG tablet Take 20 mg by mouth daily.    isosorbide mononitrate (IMDUR) 30 MG 24 hr tablet Take 3 tablets (90 mg total) by mouth daily.   LINZESS 145 MCG CAPS capsule Take 145 mcg by mouth daily.    losartan (COZAAR) 50 MG tablet Take 1 tablet (50 mg total) by mouth daily.   metoprolol tartrate (LOPRESSOR) 50 MG tablet Take 50 mg by mouth 2 (two) times daily.   NITROSTAT 0.4 MG SL tablet PLACE 1 TAB UNDER TONGUE EVERY 5 Wang UP TO 3 TIMES AS NEEDED FOR CHEST PAIN, IF NO RELIEF CALL 911. (Patient taking differently: Place 0.4 mg under the tongue every 5 (five) minutes as needed for chest pain. )   NONFORMULARY OR COMPOUNDED ITEM Inject 5 mg  into the muscle every 7 (seven) days. Testosterone cypionate in olive  oil (25 mg/mL).  Dispense 5 mL vial.   NP THYROID 90 MG tablet Take 90 mg by mouth daily.   potassium chloride 20 MEQ/15ML (10%) SOLN Take 15 mLs by mouth daily.   Current Facility-Administered Medications for the 02/27/19 encounter (Telemedicine) with Verta Ellen., NP  Medication   NONFORMULARY OR COMPOUNDED ITEM 0.2 mL     Allergies:   Bee venom, Penicillins, and Shellfish allergy   Social History   Tobacco Use   Smoking status: Former Smoker    Packs/day: 0.20    Years: 32.00    Pack years: 6.40    Types: Cigarettes, Cigars    Start date: 07/05/1983    Quit date: 04/03/2012    Years since quitting: 6.9   Smokeless tobacco: Never Used  Substance Use Topics   Alcohol use: No    Alcohol/week: 0.0 standard drinks   Drug use: No     Family Hx: The patient's family history includes ADD / ADHD in her daughter, son, and son; Alzheimer's disease in her paternal aunt; Bipolar disorder in her daughter, son, and son; Cancer in her father, maternal uncle, and paternal aunt; Cirrhosis in her maternal uncle; Colon cancer in her paternal grandfather;  Heart attack in her brother; Heart disease in her brother; Mental illness in her daughter, son, and son.  ROS:   Please see the history of present illness.    All other systems reviewed and are negative.   Prior CV studies:   The following studies were reviewed today:  Echocardiogram 10/25/2018 IMPRESSIONS  1. The left ventricle has normal systolic function with an ejection fraction of 60-65%. The cavity size was normal. There is moderate concentric left ventricular hypertrophy. Left ventricular diastolic Doppler parameters are consistent with  pseudonormalization. No evidence of left ventricular regional wall motion abnormalities.  2. The right ventricle has normal systolic function. The cavity was normal. There is mildly increased right ventricular wall thickness.  3. The mitral valve is grossly normal.  4. The tricuspid valve is grossly normal.  5. The aortic valve is tricuspid. Aortic valve regurgitation is moderate by color flow Doppler.    Cath: 10/25/18   Dist LAD lesion is 50% stenosed.  Ost 2nd Diag lesion is 75% stenosed.  1st Mrg lesion is 60% stenosed.  Prox Cx to Dist Cx lesion is 30% stenosed.  2nd Mrg lesion is 20% stenosed.  Prox RCA to Mid RCA lesion is 100% stenosed.  Mid LAD-1 lesion is 20% stenosed.  Mid LAD-2 lesion is 30% stenosed.  Prox LAD lesion is 30% stenosed.  2nd Diag lesion is 70% stenosed.  1. Significant underlying two-vessel coronary artery disease with widely patent LAD stent with minimal restenosis. Chronically occluded RCA stent with well-developed left-to-right collaterals. Jailed second diagonal has a stable 70% ostial stenosis. Moderate OM1 disease. 2. Left ventricular angiography was not performed. EF is normal by echo. 3. Normal left ventricular end-diastolic pressure at 11 mmHg.  Recommendations: I do not see a culprit for the patient's presumed unstable angina. Her coronary anatomy is unchanged from 2018 and if  anything, the disease in second diagonal and OM1 appear improved in appearance. I recommend continuing aggressive medical therapy.  Labs/Other Tests and Data Reviewed:    EKG:  An ECG dated October 26, 2018 was personally reviewed today and demonstrated:  Sinus bradycardia rate of 55 septal infarct.  Recent Labs: 10/24/2018: Hemoglobin 13.6; Platelets 266 12/25/2018:  TSH 3.21 02/21/2019: ALT 22; BUN 7; Creat 0.83; Potassium 4.6; Sodium 140   Recent Lipid Panel Lab Results  Component Value Date/Time   CHOL 192 10/26/2018 04:15 AM   TRIG 71 10/26/2018 04:15 AM   HDL 45 10/26/2018 04:15 AM   CHOLHDL 4.3 10/26/2018 04:15 AM   LDLCALC 133 (H) 10/26/2018 04:15 AM    Wt Readings from Last 3 Encounters:  02/27/19 160 lb (72.6 kg)  02/27/19 160 lb (72.6 kg)  02/21/19 160 lb 3.2 oz (72.7 kg)     Objective:    Vital Signs:  BP (!) 146/100    Pulse 79    Ht 5\' 4"  (1.626 m)    Wt 160 lb (72.6 kg)    BMI 27.46 kg/m    VITAL SIGNS:  reviewed   Patient had a normal speech pattern and demonstrated no evidence of shortness of breath, cough, or wheezing.  ASSESSMENT & PLAN:    1. Coronary artery disease: Patient continues to complain of exertional chest pressure on and off.  States chest pain can occur with or without exertion.  States she is compliant with all of her medications.  States she is taking her aspirin 81 mg, Plavix 75 mg, isosorbide mononitrate 30 mg, losartan 50 mg and metoprolol 50 mg as directed.  Patient attributes much of her chest pain to social and economic stressful situation as well as grieving for her recently deceased mother.  Recent cardiac catheterization in July recommended aggressive medical therapy.  See cath report above.  2. Essential hypertension: Blood pressure today 146/90.  Taking antihypertensive medication as directed losartan 50 mg and metoprolol 50 mg.  May need to uptitrate ARB or beta-blocker if blood pressure remains elevated.  3. Hyperlipidemia: Taking  Lipitor 40 mg as directed.  Last LDL was 133.  May need to recheck fasting lipid profile at next follow-up if not done at PCP office.  4. Palpitations: Patient denies any recent palpitations or abnormal skipping or racing.  Last EKG showed sinus bradycardia with a rate of 55 in July 2020.   All medications reviewed with patient  COVID-19 Education: The signs and symptoms of COVID-19 were discussed with the patient and how to seek care for testing (follow up with PCP or arrange E-visit).  The importance of social distancing was discussed today.  Time:   Today, I have spent 15 minutes with the patient with telehealth technology discussing the above problems.     Medication Adjustments/Labs and Tests Ordered: Current medicines are reviewed at length with the patient today.  Concerns regarding medicines are outlined above.   Tests Ordered: No orders of the defined types were placed in this encounter.   Medication Changes: No orders of the defined types were placed in this encounter.   Follow Up:  Either In Person or Virtual in 9 month(s)  Signed, Verta Ellen, NP  02/27/2019 3:18 PM    [place]

## 2019-02-27 NOTE — Progress Notes (Signed)
Pt was given 0.63mL the right thigh. Pt tolerated well;no comnplaints.

## 2019-03-06 ENCOUNTER — Other Ambulatory Visit (INDEPENDENT_AMBULATORY_CARE_PROVIDER_SITE_OTHER): Payer: Self-pay | Admitting: Internal Medicine

## 2019-03-06 ENCOUNTER — Ambulatory Visit (INDEPENDENT_AMBULATORY_CARE_PROVIDER_SITE_OTHER): Payer: Medicare Other

## 2019-03-06 ENCOUNTER — Other Ambulatory Visit: Payer: Self-pay

## 2019-03-06 ENCOUNTER — Telehealth (INDEPENDENT_AMBULATORY_CARE_PROVIDER_SITE_OTHER): Payer: Self-pay

## 2019-03-06 DIAGNOSIS — Z993 Dependence on wheelchair: Secondary | ICD-10-CM

## 2019-03-06 DIAGNOSIS — G40802 Other epilepsy, not intractable, without status epilepticus: Secondary | ICD-10-CM

## 2019-03-06 DIAGNOSIS — R6882 Decreased libido: Secondary | ICD-10-CM

## 2019-03-06 DIAGNOSIS — G8929 Other chronic pain: Secondary | ICD-10-CM | POA: Diagnosis not present

## 2019-03-06 DIAGNOSIS — M539 Dorsopathy, unspecified: Secondary | ICD-10-CM | POA: Diagnosis not present

## 2019-03-06 DIAGNOSIS — H3530 Unspecified macular degeneration: Secondary | ICD-10-CM | POA: Diagnosis not present

## 2019-03-06 DIAGNOSIS — Z7982 Long term (current) use of aspirin: Secondary | ICD-10-CM

## 2019-03-06 MED ORDER — MEDICAL ID BRACELET MISC: 1.0000 [IU] | Freq: Every day | Status: DC

## 2019-03-06 NOTE — Telephone Encounter (Signed)
Pt CAP case worker asked her to get a new order for a her [place]. PT has has last one over 8 years; it is not working at this time.

## 2019-03-06 NOTE — Progress Notes (Signed)
Pt given 0.74ml IM to the left thigh.Pt tolerated well no complaints.

## 2019-03-13 ENCOUNTER — Emergency Department (HOSPITAL_COMMUNITY)
Admission: EM | Admit: 2019-03-13 | Discharge: 2019-03-13 | Disposition: A | Discharge status: Home / Self Care | Payer: Medicare Other | Attending: Emergency Medicine | Admitting: Emergency Medicine

## 2019-03-13 ENCOUNTER — Encounter (HOSPITAL_COMMUNITY): Payer: Self-pay | Admitting: Emergency Medicine

## 2019-03-13 ENCOUNTER — Other Ambulatory Visit: Payer: Self-pay

## 2019-03-13 ENCOUNTER — Ambulatory Visit (INDEPENDENT_AMBULATORY_CARE_PROVIDER_SITE_OTHER): Payer: Medicare Other

## 2019-03-13 DIAGNOSIS — R42 Dizziness and giddiness: Secondary | ICD-10-CM | POA: Diagnosis not present

## 2019-03-13 DIAGNOSIS — R6882 Decreased libido: Secondary | ICD-10-CM

## 2019-03-13 LAB — CBC WITH DIFFERENTIAL/PLATELET
Abs Immature Granulocytes: 0 10*3/uL (ref 0.00–0.07)
Basophils Absolute: 0 10*3/uL (ref 0.0–0.1)
Basophils Relative: 0 %
Eosinophils Absolute: 0.1 10*3/uL (ref 0.0–0.5)
Eosinophils Relative: 1 %
HCT: 40.2 % (ref 36.0–46.0)
Hemoglobin: 13 g/dL (ref 12.0–15.0)
Immature Granulocytes: 0 %
Lymphocytes Relative: 56 %
Lymphs Abs: 4.1 10*3/uL — ABNORMAL HIGH (ref 0.7–4.0)
MCH: 27.5 pg (ref 26.0–34.0)
MCHC: 32.3 g/dL (ref 30.0–36.0)
MCV: 85.2 fL (ref 80.0–100.0)
Monocytes Absolute: 0.6 10*3/uL (ref 0.1–1.0)
Monocytes Relative: 8 %
Neutro Abs: 2.6 10*3/uL (ref 1.7–7.7)
Neutrophils Relative %: 35 %
Platelets: 235 10*3/uL (ref 150–400)
RBC: 4.72 MIL/uL (ref 3.87–5.11)
RDW: 14.7 % (ref 11.5–15.5)
WBC: 7.4 10*3/uL (ref 4.0–10.5)
nRBC: 0 % (ref 0.0–0.2)

## 2019-03-13 LAB — COMPREHENSIVE METABOLIC PANEL
ALT: 15 U/L (ref 0–44)
AST: 18 U/L (ref 15–41)
Albumin: 4.2 g/dL (ref 3.5–5.0)
Alkaline Phosphatase: 113 U/L (ref 38–126)
Anion gap: 9 (ref 5–15)
BUN: 8 mg/dL (ref 6–20)
CO2: 25 mmol/L (ref 22–32)
Calcium: 9.3 mg/dL (ref 8.9–10.3)
Chloride: 106 mmol/L (ref 98–111)
Creatinine, Ser: 0.77 mg/dL (ref 0.44–1.00)
GFR calc Af Amer: >60 mL/min (ref >60)
GFR calc non Af Amer: >60 mL/min (ref >60)
Glucose, Bld: 87 mg/dL (ref 70–99)
Potassium: 3.8 mmol/L (ref 3.5–5.1)
Sodium: 140 mmol/L (ref 135–145)
Total Bilirubin: 0.5 mg/dL (ref 0.3–1.2)
Total Protein: 7.3 g/dL (ref 6.5–8.1)

## 2019-03-13 MED ORDER — DIAZEPAM 2 MG PO TABS
2.0000 mg | ORAL_TABLET | Freq: Once | ORAL | Status: AC
  Administered 2019-03-13: 2 mg via ORAL
  Filled 2019-03-13: qty 1

## 2019-03-13 MED ORDER — FLUTICASONE PROPIONATE 50 MCG/ACT NA SUSP: 1.0000 | Freq: Every day | NASAL | 0 refills | Status: DC

## 2019-03-13 MED ORDER — MECLIZINE HCL 12.5 MG PO TABS: 25.0000 mg | ORAL_TABLET | Freq: Once | ORAL | Status: DC

## 2019-03-13 MED ORDER — OXYMETAZOLINE HCL 0.05 % NA SOLN
1.0000 | Freq: Once | NASAL | Status: AC
  Administered 2019-03-13: 1 via NASAL
  Filled 2019-03-13: qty 30

## 2019-03-13 MED ORDER — MECLIZINE HCL 25 MG PO TABS: 25.0000 mg | ORAL_TABLET | Freq: Three times a day (TID) | ORAL | 0 refills | Status: DC | PRN

## 2019-03-13 NOTE — ED Triage Notes (Signed)
PT states she went to her PCP today for headache x3 days, decreased libido, and HTN. PT states she has an upcoming surgery to remove growth under right axilla and has hx of breast cancer. PT states she doesn't have any energy.

## 2019-03-13 NOTE — Progress Notes (Signed)
Pt tolerated was given 0.2IM to the Rt thigh. Pt tolerated well. NO complaints.

## 2019-03-13 NOTE — ED Provider Notes (Signed)
[facility] EMERGENCY DEPARTMENT Provider Note   CSN: [id number] Arrival date & time: 03/13/19  1817     History   Chief Complaint Chief Complaint  Patient presents with  . Weakness    HPI Bethany Wang is a 53 y.o. female.     HPI  53 year old female, with a PMH of CAD with 2 stents, breast cancer, prediabetic, presents with dizziness.  Patient states for the last 2 days she has felt like she is spinning.  She states that symptoms are worse when she tries to move, stand up and walk around.  She notes a slight headache but she states this is normal for her.  Patient denies any recent head injury or trauma.  She denies any chest pain, shortness of breath, nausea, vomiting.  She denies a history of dizziness or vertigo.   Past Medical History:  Diagnosis Date  . Anemia   . Anxiety   . Bilateral breast cysts 12/30/2015  . Breast cancer ([place])    Left Breast Cancer  . BV (bacterial vaginosis) 09/09/2015  . Coronary artery disease   . Coronary atherosclerosis of native coronary artery    a. s/p DES to RCA in 2013 b. DES to LAD in 2014 c. cath in 04/2016 showing patent LAD stent with D2 jailed by LAD stent and CTO of RCA with left to right collaterals present  . Cyst of pharynx or nasopharynx    [name]  . Decreased libido 12/25/2018  . Depression   . Essential hypertension   . Falls    x 3-4 in past year  . GERD (gastroesophageal reflux disease)   . History of breast cancer 09/09/2015  . History of hematuria   . Mixed hyperlipidemia   . Personal history of chemotherapy    Left Breast Cancer  . Personal history of radiation therapy    Left Breast Cancer  . Pre-diabetes   . PUD (peptic ulcer disease)   . Seizures ([place])   . Shingles 09/09/2015  . Stroke ([place])    12-2017 on Plavix, only deficit is headaches  . Suicide attempt [facility])    3 attempts in remote past  . Uterine cancer [facility])     Patient Active Problem List   Diagnosis Date Noted  .  Decreased libido 12/25/2018  . Angina pectoris ([place]) 10/25/2018  . Palpitations 06/06/2018  . Chest pain 07/13/2017  . Nonspecific chest pain 07/13/2017  . Essential hypertension 07/13/2017  . Mixed hyperlipidemia 07/13/2017  . Precordial chest pain   . Hypertensive urgency 06/15/2016  . Unstable angina pectoris ([place]) 06/15/2016  . Unstable angina ([place]) 06/15/2016  . Bilateral breast cysts 12/30/2015  . Vaginal discharge 09/09/2015  . BV (bacterial vaginosis) 09/09/2015  . Shingles 09/09/2015  . History of breast cancer 09/09/2015  . Syncope and collapse 08/08/2015  . Migraine with aura and with status migrainosus, not intractable 08/08/2015  . Chronic low back pain 08/08/2015  . Insomnia 08/08/2015  . Anxiety state 08/08/2015  . Panic attacks 08/08/2015  . Anemia, iron deficiency 08/08/2015  . Pica 08/08/2015  . Angina decubitus ([place]) 07/03/2014  . 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 Encounter for gynecological examination with Papanicolaou smear of cervix 02/04/2014  . Syncope 12/14/2013  . Cervical disc disorder with radiculopathy of cervical region 02/19/2013  . H/O rotator cuff surgery 02/19/2013  . Dyspareunia 02/01/2013  . Left shoulder pain 01/24/2013  . Rotator cuff tear 01/24/2013  . Radicular pain 01/24/2013  . Labral tear of  shoulder 01/24/2013  . Complex regional pain syndrome of upper extremity 01/24/2013  . Herniated disc, cervical 01/24/2013  . Nausea and vomiting 01/18/2013  . Rectal bleeding 01/18/2013  . Edema of left foot 10/16/2012  . Coronary atherosclerosis of native coronary artery   . Essential hypertension, benign   . Tobacco abuse     Past Surgical History:  Procedure Laterality Date  . ABDOMINAL HYSTERECTOMY    . BLADDER SURGERY    . BREAST LUMPECTOMY Left   . BREAST LUMPECTOMY Right 02/24/2018   Procedure: RIGHT BREAST LUMPECTOMY ERAS PATHWAY;  Surgeon: Jovita Kussmaul, MD;  Location: [place];  Service: General;  Laterality:  Right;  . CARDIAC CATHETERIZATION N/A 05/10/2016   Procedure: Left Heart Cath and Coronary Angiography;  Surgeon: Belva Crome, MD;  Location: [place] CV LAB;  Service: Cardiovascular;  Laterality: N/A;  . COLONOSCOPY  May 2010   Fleishman: normal rectum, internal hemorrhoids, , benign colonic polyp  . COLONOSCOPY N/A 01/05/2017   Procedure: COLONOSCOPY;  Surgeon: Rogene Houston, MD;  [facility];  Service: Endoscopy;  Laterality: N/A;  1:00  . ESOPHAGOGASTRODUODENOSCOPY     2001 Dr. Amedeo Plenty: distal esophagitis, small hiatal hernia,. Dr. Tamala Julian 2006? no records available currently, pt also reports  EGD a few years ago with Dr. Gala Romney, do not have these reports anywhere in medical records  . ESOPHAGOGASTRODUODENOSCOPY  06/02/2011   [id number] pill impaction as described above s/p dilation of a probable cervical esophageal web/bx abnormal esophageal and gastric mucosa. + H.pylori gastritis   . EYE SURGERY     Removed glass  . HAND SURGERY Right   . Left breast lumpectomy     Benign  . LEFT HEART CATH AND CORONARY ANGIOGRAPHY N/A 10/25/2018   Procedure: LEFT HEART CATH AND CORONARY ANGIOGRAPHY;  Surgeon: Wellington Hampshire, MD;  Location: [place] CV LAB;  Service: Cardiovascular;  Laterality: N/A;  . LEFT HEART CATHETERIZATION WITH CORONARY ANGIOGRAM N/A 07/03/2014   Procedure: LEFT HEART CATHETERIZATION WITH CORONARY ANGIOGRAM;  Surgeon: Belva Crome, MD;  Location[facility] CATH LAB;  Service: Cardiovascular;  Laterality: N/A;  . POLYPECTOMY  01/05/2017   Procedure: POLYPECTOMY;  Surgeon: Rogene Houston, MD;  [facility];  Service: Endoscopy;;  colon  . SHOULDER SURGERY Left      OB History    Gravida  4   Para  3   Term      Preterm      AB  1   Living  3     SAB  1   TAB      Ectopic      Multiple      Live Births               Home Medications    Prior to Admission medications   Medication Sig Start Date End Date Taking? Authorizing  Provider  ALPRAZolam Duanne Moron) 0.5 MG tablet Take 0.5 mg by mouth every morning.     [provider]  amLODipine (NORVASC) 10 MG tablet Take 1 tablet (10 mg total) by mouth daily. 11/23/18   Barrett, Evelene Croon, PA-C  aspirin EC 81 MG tablet Take 162 mg by mouth 2 (two) times daily.    [provider]  atorvastatin (LIPITOR) 40 MG tablet Take 1 tablet (40 mg total) by mouth daily at 6 PM. 06/16/16   Eber Jones, MD  clopidogrel (PLAVIX) 75 MG tablet Take 75 mg by mouth daily.  12/13/17   [provider]  doxycycline (VIBRA-TABS) 100 MG tablet Take 1 tablet (100 mg total) by mouth 2 (two) times daily. 02/27/19   Gosrani, Nimish C, MD  EPINEPHrine 0.3 mg/0.3 mL IJ SOAJ injection Inject 0.3 mg into the muscle once.     [provider]  escitalopram (LEXAPRO) 20 MG tablet Take 20 mg by mouth daily.  11/03/15   [provider]  isosorbide mononitrate (IMDUR) 30 MG 24 hr tablet Take 3 tablets (90 mg total) by mouth daily. 10/27/18   Cheryln Manly, NP  LINZESS 145 MCG CAPS capsule Take 145 mcg by mouth daily.  10/17/18   [provider]  losartan (COZAAR) 50 MG tablet Take 1 tablet (50 mg total) by mouth daily. 10/27/18   Cheryln Manly, NP  Medical ID Bracelet MISC 1 Units by Does not apply route daily. 03/06/19   [name], MD  metoprolol tartrate (LOPRESSOR) 50 MG tablet Take 50 mg by mouth 2 (two) times daily.    [provider]  NITROSTAT 0.4 MG SL tablet PLACE 1 TAB UNDER TONGUE EVERY 5 MIN UP TO 3 TIMES AS NEEDED FOR CHEST PAIN, IF NO RELIEF CALL 911. Patient taking differently: Place 0.4 mg under the tongue every 5 (five) minutes as needed for chest pain.  05/24/17   Herminio Commons, MD  NONFORMULARY OR COMPOUNDED ITEM Inject 5 mg into the muscle every 7 (seven) days. Testosterone cypionate in olive  oil (25 mg/mL).  Dispense 5 mL vial. 01/02/19   Hurshel Party C, MD  NP THYROID 90 MG tablet Take 90 mg by mouth daily.     [provider]  potassium chloride 20 MEQ/15ML (10%) SOLN Take 15 mLs by mouth daily. 10/31/18   [provider]    Family History Family History  Problem Relation Age of Onset  . Colon cancer Paternal Grandfather   . Cancer Father   . Cancer Maternal Uncle   . Alzheimer's disease Paternal Aunt   . Cirrhosis Maternal Uncle   . Cancer Paternal Aunt        lung  . Heart disease Brother   . Heart attack Brother   . Mental illness Daughter   . ADD / ADHD Daughter   . Bipolar disorder Daughter   . Mental illness Son   . ADD / ADHD Son   . Bipolar disorder Son   . Mental illness Son   . ADD / ADHD Son   . Bipolar disorder Son     Social History Social History   Tobacco Use  . Smoking status: Former Smoker    Packs/day: 0.20    Years: 32.00    Pack years: 6.40    Types: Cigarettes, Cigars    Start date: 07/05/1983    Quit date: 04/03/2012    Years since quitting: 6.9  . Smokeless tobacco: Never Used  Substance Use Topics  . Alcohol use: No    Alcohol/week: 0.0 standard drinks  . Drug use: No     Allergies   Bee venom, Penicillins, and Shellfish allergy   Review of Systems Review of Systems  Constitutional: Negative for chills and fever.  HENT: Negative for rhinorrhea and sore throat.   Eyes: Negative for photophobia.  Respiratory: Negative for cough and shortness of breath.   Cardiovascular: Negative for chest pain and leg swelling.  Gastrointestinal: Negative for abdominal pain, diarrhea, nausea and vomiting.  Genitourinary: Negative for dysuria, frequency and urgency.  Skin: Negative  for rash and wound.  Neurological: Positive for dizziness. Negative for syncope, facial asymmetry, speech difficulty and numbness.  All other systems reviewed and are negative.    Physical Exam Updated Vital Signs BP (!) 174/98 (BP Location: Right Arm)   Pulse 72   Temp 98.9 F (37.2 C) (Oral)   Resp 18   Ht 5\' 4"  (1.626 m)   Wt 68.9 kg   SpO2 98%    BMI 26.09 kg/m   Physical Exam Vitals signs and nursing note reviewed.  Constitutional:      Appearance: She is well-developed.  HENT:     Head: Normocephalic and atraumatic.     Right Ear: Tympanic membrane normal.     Left Ear: Tympanic membrane normal.     Nose: Congestion present. No rhinorrhea.  Eyes:     Conjunctiva/sclera: Conjunctivae normal.  Neck:     Musculoskeletal: Neck supple.  Cardiovascular:     Rate and Rhythm: Normal rate and regular rhythm.     Heart sounds: Normal heart sounds. No murmur.  Pulmonary:     Effort: Pulmonary effort is normal. No respiratory distress.     Breath sounds: Normal breath sounds. No wheezing or rales.  Abdominal:     General: Bowel sounds are normal. There is no distension.     Palpations: Abdomen is soft.     Tenderness: There is no abdominal tenderness.  Musculoskeletal: Normal range of motion.        General: No tenderness or deformity.  Skin:    General: Skin is warm and dry.     Findings: No erythema or rash.  Neurological:     General: No focal deficit present.     Mental Status: She is alert and oriented to person, place, and time.     Cranial Nerves: Cranial nerves are intact.     Sensory: Sensation is intact.     Motor: Motor function is intact.     Coordination: Coordination is intact. Finger-Nose-Finger Test normal.     Gait: Gait is intact.  Psychiatric:        Behavior: Behavior normal.      ED Treatments / Results  Labs (all labs ordered are listed, but only abnormal results are displayed) Labs Reviewed  CBC WITH DIFFERENTIAL/PLATELET  COMPREHENSIVE METABOLIC PANEL    EKG None  Radiology No results found.  Procedures Procedures (including critical care time)  Medications Ordered in ED Medications  diazepam (VALIUM) tablet 2 mg (2 mg Oral Given 03/13/19 2036)     Initial Impression / Assessment and Plan / ED Course  I have reviewed the triage vital signs and the nursing notes.  Pertinent  labs & imaging results that were available during my care of the patient were reviewed by me and considered in my medical decision making (see chart for details).        Patient presented with dizziness.  She felt that she was spinning.  On initial exam patient lying on the bed and states that she feels like she is moving.  She is uncooperative initially with exam.  She does note symptoms are worse with moving her head or standing up.  I was able to do a neuro exam which is nonfocal, nonlateralizing.  No nystagmus noted on exam.  Patient has intact posterior circulation with normal finger-to-nose.  She was given Valium and a nasal spray.  She is feeling significantly better.  She is up and ambulating around the room without difficulty.  Prescription sent  for home.  She was encouraged to follow-up with PCP and given return precautions.  Patient agreeable with plan, ready and stable for discharge.    At this time there does not appear to be any evidence of an acute emergency medical condition and the patient appears stable for discharge with appropriate outpatient follow up.Diagnosis was discussed with patient who verbalizes understanding and is agreeable to discharge. Pt case discussed with Dr. Eulis Foster who agrees with my plan.  Final Clinical Impressions(s) / ED Diagnoses   Final diagnoses:  None    ED Discharge Orders    None       Rachel Moulds 03/13/19 2326    Daleen Bo, MD 03/13/19 2330

## 2019-03-13 NOTE — Discharge Instructions (Signed)
Drink plenty of fluids.  Take meclizine as needed for dizziness.  Use Afrin daily on Wednesday or Thursday.  Then restart Flonase.  Follow-up with your primary care provider within 5 days for continued evaluation of your symptoms.  Return to the ED immediately for new or worsening symptoms or concerns, such as chest pain, shortness of breath, fevers, new or worsening dizziness or any concerns at all.

## 2019-03-15 ENCOUNTER — Other Ambulatory Visit: Payer: Self-pay

## 2019-03-15 ENCOUNTER — Ambulatory Visit (HOSPITAL_COMMUNITY)
Admission: RE | Admit: 2019-03-15 | Discharge: 2019-03-15 | Disposition: A | Discharge status: Home / Self Care | Payer: Medicare Other | Source: Ambulatory Visit | Attending: Internal Medicine | Admitting: Internal Medicine

## 2019-03-15 DIAGNOSIS — Z1231 Encounter for screening mammogram for malignant neoplasm of breast: Secondary | ICD-10-CM | POA: Diagnosis present

## 2019-03-15 DIAGNOSIS — Z853 Personal history of malignant neoplasm of breast: Secondary | ICD-10-CM | POA: Insufficient documentation

## 2019-03-15 DIAGNOSIS — Z13820 Encounter for screening for osteoporosis: Secondary | ICD-10-CM | POA: Diagnosis present

## 2019-03-15 DIAGNOSIS — Z780 Asymptomatic menopausal state: Secondary | ICD-10-CM | POA: Diagnosis not present

## 2019-03-16 ENCOUNTER — Encounter (INDEPENDENT_AMBULATORY_CARE_PROVIDER_SITE_OTHER): Payer: Self-pay | Admitting: Internal Medicine

## 2019-03-20 ENCOUNTER — Other Ambulatory Visit: Payer: Self-pay

## 2019-03-20 ENCOUNTER — Ambulatory Visit (INDEPENDENT_AMBULATORY_CARE_PROVIDER_SITE_OTHER): Payer: Medicare Other

## 2019-03-20 DIAGNOSIS — R6882 Decreased libido: Secondary | ICD-10-CM

## 2019-03-20 NOTE — Progress Notes (Signed)
Pt was given 0.2ML for the testosterone to the left thigh.

## 2019-03-27 ENCOUNTER — Ambulatory Visit (INDEPENDENT_AMBULATORY_CARE_PROVIDER_SITE_OTHER): Payer: Medicare Other

## 2019-03-29 ENCOUNTER — Other Ambulatory Visit: Payer: Self-pay

## 2019-03-29 ENCOUNTER — Ambulatory Visit (INDEPENDENT_AMBULATORY_CARE_PROVIDER_SITE_OTHER): Payer: Medicare Other

## 2019-03-29 VITALS — Ht 64.0 in | Wt 162.4 lb

## 2019-03-29 DIAGNOSIS — R6882 Decreased libido: Secondary | ICD-10-CM

## 2019-04-02 ENCOUNTER — Ambulatory Visit (INDEPENDENT_AMBULATORY_CARE_PROVIDER_SITE_OTHER): Payer: Medicare Other | Admitting: Nurse Practitioner

## 2019-04-02 ENCOUNTER — Telehealth (INDEPENDENT_AMBULATORY_CARE_PROVIDER_SITE_OTHER): Payer: Self-pay | Admitting: Nurse Practitioner

## 2019-04-02 NOTE — Telephone Encounter (Signed)
I called this patient as she is scheduled for an appointment today at 11 AM, and it is now 11:13.  She did not answer the phone, I did leave a message asking her to call us back so we can reschedule her annual wellness visit.

## 2019-04-03 ENCOUNTER — Encounter (INDEPENDENT_AMBULATORY_CARE_PROVIDER_SITE_OTHER): Payer: Self-pay | Admitting: Internal Medicine

## 2019-04-03 ENCOUNTER — Other Ambulatory Visit: Payer: Self-pay

## 2019-04-03 ENCOUNTER — Encounter (INDEPENDENT_AMBULATORY_CARE_PROVIDER_SITE_OTHER): Payer: Medicare Other | Admitting: Nurse Practitioner

## 2019-04-03 ENCOUNTER — Ambulatory Visit (INDEPENDENT_AMBULATORY_CARE_PROVIDER_SITE_OTHER): Payer: Medicare Other | Admitting: Internal Medicine

## 2019-04-03 VITALS — BP 150/90 | HR 72 | Ht 64.0 in | Wt 161.0 lb

## 2019-04-03 DIAGNOSIS — E782 Mixed hyperlipidemia: Secondary | ICD-10-CM | POA: Diagnosis not present

## 2019-04-03 DIAGNOSIS — I10 Essential (primary) hypertension: Secondary | ICD-10-CM

## 2019-04-03 DIAGNOSIS — R6882 Decreased libido: Secondary | ICD-10-CM | POA: Diagnosis not present

## 2019-04-03 DIAGNOSIS — I25119 Atherosclerotic heart disease of native coronary artery with unspecified angina pectoris: Secondary | ICD-10-CM

## 2019-04-03 DIAGNOSIS — E559 Vitamin D deficiency, unspecified: Secondary | ICD-10-CM

## 2019-04-03 HISTORY — DX: Vitamin D deficiency, unspecified: E55.9

## 2019-04-03 NOTE — Progress Notes (Signed)
Metrics: Intervention Frequency ACO  Documented Smoking Status Yearly  Screened one or more times in 24 months  Cessation Counseling or  Active cessation medication Past 24 months  Past 24 months   Guideline developer: UpToDate (See UpToDate for funding source) Date Released: 2014       Wellness Office Visit  Subjective:  Patient ID: Bethany Wang, female    DOB: July 05, 1965  Age: 53 y.o. MRN: [id number]  CC: This lady comes in for follow-up of coronary artery disease, hypertension, decreased libido, hyperlipidemia. HPI  She is doing reasonably well at this point in time and has not had any recurrent chest pains. She is compliant with taking statin therapy for hyperlipidemia in the face of diabetes. She continues on antihypertensive therapy. She also continues on desiccated thyroid for symptoms of thyroid deficiency. She also continues on testosterone therapy. Past Medical History:  Diagnosis Date  . Anemia   . Anxiety   . Bilateral breast cysts 12/30/2015  . Breast cancer ([place])    Left Breast Cancer  . BV (bacterial vaginosis) 09/09/2015  . Coronary artery disease   . Coronary atherosclerosis of native coronary artery    a. s/p DES to RCA in 2013 b. DES to LAD in 2014 c. cath in 04/2016 showing patent LAD stent with D2 jailed by LAD stent and CTO of RCA with left to right collaterals present  . Cyst of pharynx or nasopharynx    [name]  . Decreased libido 12/25/2018  . Depression   . Essential hypertension   . Falls    x 3-4 in past year  . GERD (gastroesophageal reflux disease)   . History of breast cancer 09/09/2015  . History of hematuria   . Mixed hyperlipidemia   . Personal history of chemotherapy    Left Breast Cancer  . Personal history of radiation therapy    Left Breast Cancer  . Pre-diabetes   . PUD (peptic ulcer disease)   . Seizures ([place])   . Shingles 09/09/2015  . Stroke ([place])    12-2017 on Plavix, only deficit is headaches  .  Suicide attempt ([place])    3 attempts in remote past  . Uterine cancer ([place])   . Vitamin D deficiency disease 04/03/2019      Family History  Problem Relation Age of Onset  . Colon cancer Paternal Grandfather   . Cancer Father   . Cancer Maternal Uncle   . Alzheimer's disease Paternal Aunt   . Cirrhosis Maternal Uncle   . Cancer Paternal Aunt        lung  . Heart disease Brother   . Heart attack Brother   . Mental illness Daughter   . ADD / ADHD Daughter   . Bipolar disorder Daughter   . Mental illness Son   . ADD / ADHD Son   . Bipolar disorder Son   . Mental illness Son   . ADD / ADHD Son   . Bipolar disorder Son     Social History   Social History Narrative   Patient lives at home with her husband.On disability since age 79 yrs.Currently living in a hotel,looking for permanent residence.   Social History   Tobacco Use  . Smoking status: Former Smoker    Packs/day: 0.20    Years: 32.00    Pack years: 6.40    Types: Cigarettes, Cigars    Start date: 07/05/1983    Quit date: 04/03/2012    Years since quitting: 7.0  .  Smokeless tobacco: Never Used  Substance Use Topics  . Alcohol use: No    Alcohol/week: 0.0 standard drinks    Current Meds  Medication Sig  . ALPRAZolam (XANAX) 0.5 MG tablet Take 0.5 mg by mouth every morning.   [name] amLODipine (NORVASC) 10 MG tablet Take 1 tablet (10 mg total) by mouth daily.  [name] aspirin EC 81 MG tablet Take 162 mg by mouth 2 (two) times daily.  [name] atorvastatin (LIPITOR) 40 MG tablet Take 1 tablet (40 mg total) by mouth daily at 6 PM.  . clopidogrel (PLAVIX) 75 MG tablet Take 75 mg by mouth daily.   [name] escitalopram (LEXAPRO) 20 MG tablet Take 20 mg by mouth daily.   . fluticasone (FLONASE) 50 MCG/ACT nasal spray Place 1 spray into both nostrils daily.  . isosorbide mononitrate (IMDUR) 30 MG 24 hr tablet Take 3 tablets (90 mg total) by mouth daily.  [name] LINZESS 145 MCG CAPS capsule Take 145 mcg by mouth daily.   [name] losartan (COZAAR)  50 MG tablet Take 1 tablet (50 mg total) by mouth daily.  . meclizine (ANTIVERT) 25 MG tablet Take 1 tablet (25 mg total) by mouth 3 (three) times daily as needed for dizziness.  . Medical ID Bracelet MISC 1 Units by Does not apply route daily.  . metoprolol tartrate (LOPRESSOR) 50 MG tablet Take 50 mg by mouth 2 (two) times daily.  [name] NITROSTAT 0.4 MG SL tablet PLACE 1 TAB UNDER TONGUE EVERY 5 MIN UP TO 3 TIMES AS NEEDED FOR CHEST PAIN, IF NO RELIEF CALL 911. (Patient taking differently: Place 0.4 mg under the tongue every 5 (five) minutes as needed for chest pain. )  . NONFORMULARY OR COMPOUNDED ITEM Inject 5 mg into the muscle every 7 (seven) days. Testosterone cypionate in olive  oil (25 mg/mL).  Dispense 5 mL vial.  . NP THYROID 90 MG tablet Take 90 mg by mouth daily.  . potassium chloride 20 MEQ/15ML (10%) SOLN Take 15 mLs by mouth daily.  . [DISCONTINUED] doxycycline (VIBRA-TABS) 100 MG tablet Take 1 tablet (100 mg total) by mouth 2 (two) times daily.  . [DISCONTINUED] EPINEPHrine 0.3 mg/0.3 mL IJ SOAJ injection Inject 0.3 mg into the muscle once.    Current Facility-Administered Medications for the 04/03/19 encounter (Office Visit) with [name], MD  Medication  . NONFORMULARY OR COMPOUNDED ITEM 0.2 mL     Bio Identical Hormones  Testosterone therapy is being used off label for symptoms of testosterone deficiency and benefits that it produces based on several studies.  These benefits include decreasing body fat, increasing in lean muscle mass and increasing in bone density.  There is improvement of memory, cognition.  There is improvement in exercise tolerance and endurance.  Testosterone therapy has also been shown to be protective against coronary artery disease, cerebrovascular disease, diabetes, hypertension and degenerative joint disease. I have discussed with the patient the FDA warnings regarding testosterone therapy, benefits and side effects and modes of administration as  well as monitoring blood levels and side effects  on a regular basis The patient is agreeable that testosterone therapy should be an integral part of his/her wellness,quality of life and prevention of chronic disease.  This patient is being treated with desiccated thyroid, off label, for symptoms of thyroid deficiency.  The patient has been counseled regarding side effects and how to deal with them.  Objective:   Today's Vitals: BP (!) 150/90   Pulse 72   Ht 5' 4" (1.626  m)   Wt 161 lb (73 kg)   BMI 27.64 kg/m  Vitals with BMI 04/03/2019 04/03/2019 03/29/2019  Height 5' 4" 5' 4" 5' 4"  Weight 161 lbs 161 lbs 162 lbs 6 oz  BMI 27.62 63.01 60.10  Systolic 932 355 -  Diastolic 90 88 -  Pulse 72 78 -     Physical Exam  She looks systemically well.  Blood pressure slightly elevated today but she is fairly stressed with Christmas cooking.  Heart sounds are present without murmurs or gallop rhythm.  Lung fields are clear.  She is alert and orientated without any focal neurological signs.     Assessment   1. Essential hypertension, benign   2. Atherosclerosis of native coronary artery of native heart with angina pectoris ([place])   3. Mixed hyperlipidemia   4. Decreased libido   5. Vitamin D deficiency disease       Tests ordered Orders Placed This Encounter  Procedures  . CMP with eGFR([facility])  . Lipid Panel  . Testos,Total,Free and SHBG (Female)  . Vitamin D, 25-hydroxy  . T3, Free  . TSH     Plan: 1.Blood work is ordered as above. 2.  She will continue with antihypertensive therapy as before. 3.  She will continue with statin therapy which she seems to tolerate. 4.  She will continue with testosterone therapy and we will check levels today. 5.  Further recommendations will depend on blood results and I will see her in about 3 months time for an annual physical exam.   No orders of the defined types were placed in this encounter.   [name], MD

## 2019-04-08 LAB — LIPID PANEL
Cholesterol: 230 mg/dL — ABNORMAL HIGH (ref <200)
HDL: 49 mg/dL — ABNORMAL LOW (ref >=50)
LDL Cholesterol (Calc): 155 mg/dL (calc) — ABNORMAL HIGH
Non-HDL Cholesterol (Calc): 181 mg/dL (calc) — ABNORMAL HIGH (ref <130)
Total CHOL/HDL Ratio: 4.7 (calc) (ref <5.0)
Triglycerides: 134 mg/dL (ref <150)

## 2019-04-08 LAB — VITAMIN D 25 HYDROXY (VIT D DEFICIENCY, FRACTURES): Vit D, 25-Hydroxy: 19 ng/mL — ABNORMAL LOW (ref 30–100)

## 2019-04-08 LAB — COMPLETE METABOLIC PANEL WITH GFR
AG Ratio: 1.6 (calc) (ref 1.0–2.5)
ALT: 21 U/L (ref 6–29)
AST: 22 U/L (ref 10–35)
Albumin: 4.2 g/dL (ref 3.6–5.1)
Alkaline phosphatase (APISO): 132 U/L (ref 37–153)
BUN: 8 mg/dL (ref 7–25)
CO2: 22 mmol/L (ref 20–32)
Calcium: 9.5 mg/dL (ref 8.6–10.4)
Chloride: 106 mmol/L (ref 98–110)
Creat: 0.83 mg/dL (ref 0.50–1.05)
GFR, Est African American: 93 mL/min/{1.73_m2} (ref >=60)
GFR, Est Non African American: 81 mL/min/{1.73_m2} (ref >=60)
Globulin: 2.7 g/dL (calc) (ref 1.9–3.7)
Glucose, Bld: 120 mg/dL — ABNORMAL HIGH (ref 65–99)
Potassium: 4.6 mmol/L (ref 3.5–5.3)
Sodium: 141 mmol/L (ref 135–146)
Total Bilirubin: 0.4 mg/dL (ref 0.2–1.2)
Total Protein: 6.9 g/dL (ref 6.1–8.1)

## 2019-04-08 LAB — TSH: TSH: 4.28 mIU/L

## 2019-04-08 LAB — TESTOS,TOTAL,FREE AND SHBG (FEMALE)
Free Testosterone: 7.6 pg/mL — ABNORMAL HIGH (ref 0.1–6.4)
Sex Hormone Binding: 47 nmol/L (ref 17–124)
Testosterone, Total, LC-MS-MS: 59 ng/dL — ABNORMAL HIGH (ref 2–45)

## 2019-04-08 LAB — T3, FREE: T3, Free: 3.2 pg/mL (ref 2.3–4.2)

## 2019-04-10 ENCOUNTER — Encounter (INDEPENDENT_AMBULATORY_CARE_PROVIDER_SITE_OTHER): Payer: Self-pay | Admitting: Internal Medicine

## 2019-04-10 ENCOUNTER — Ambulatory Visit (INDEPENDENT_AMBULATORY_CARE_PROVIDER_SITE_OTHER): Payer: Medicare Other | Admitting: Internal Medicine

## 2019-04-10 DIAGNOSIS — R112 Nausea with vomiting, unspecified: Secondary | ICD-10-CM | POA: Diagnosis not present

## 2019-04-10 DIAGNOSIS — R1013 Epigastric pain: Secondary | ICD-10-CM | POA: Diagnosis not present

## 2019-04-10 MED ORDER — ONDANSETRON HCL 4 MG PO TABS: 4.0000 mg | ORAL_TABLET | Freq: Three times a day (TID) | ORAL | 0 refills | Status: DC | PRN

## 2019-04-10 NOTE — Progress Notes (Signed)
Metrics: Intervention Frequency ACO  Documented Smoking Status Yearly  Screened one or more times in 24 months  Cessation Counseling or  Active cessation medication Past 24 months  Past 24 months   Guideline developer: UpToDate (See UpToDate for funding source) Date Released: 2014       Wellness Office Visit  Subjective:  Patient ID: Bethany Wang, female    DOB: 05/04/1965  Age: 53 y.o. MRN: [id number]  CC: This is an audio telemedicine visit with the permission of the patient who is at home and I am in my office.  I was easily able to recognize her voice. Her chief complaint is nausea and vomiting. HPI  She has had nausea and vomiting for the last 2 days associated with epigastric pain which seems to radiate to the back.  She says that when she eats anything, she starts to vomit.  However, she is able to keep fluids down including water.  She denies any fever.  There is no diarrhea. She is not aware of any food that might have triggered this.  We have not had any major changes in medications.  Blood work recently done about a week ago was within normal limits in terms of renal function. Past Medical History:  Diagnosis Date  . Anemia   . Anxiety   . Bilateral breast cysts 12/30/2015  . Breast cancer ([place])    Left Breast Cancer  . BV (bacterial vaginosis) 09/09/2015  . Coronary artery disease   . Coronary atherosclerosis of native coronary artery    a. s/p DES to RCA in 2013 b. DES to LAD in 2014 c. cath in 04/2016 showing patent LAD stent with D2 jailed by LAD stent and CTO of RCA with left to right collaterals present  . Cyst of pharynx or nasopharynx    [name]  . Decreased libido 12/25/2018  . Depression   . Essential hypertension   . Falls    x 3-4 in past year  . GERD (gastroesophageal reflux disease)   . History of breast cancer 09/09/2015  . History of hematuria   . Mixed hyperlipidemia   . Personal history of chemotherapy    Left Breast Cancer   . Personal history of radiation therapy    Left Breast Cancer  . Pre-diabetes   . PUD (peptic ulcer disease)   . Seizures ([place])   . Shingles 09/09/2015  . Stroke ([place])    12-2017 on Plavix, only deficit is headaches  . Suicide attempt ([place])    3 attempts in remote past  . Uterine cancer ([place])   . Vitamin D deficiency disease 04/03/2019      Family History  Problem Relation Age of Onset  . Colon cancer Paternal Grandfather   . Cancer Father   . Cancer Maternal Uncle   . Alzheimer's disease Paternal Aunt   . Cirrhosis Maternal Uncle   . Cancer Paternal Aunt        lung  . Heart disease Brother   . Heart attack Brother   . Mental illness Daughter   . ADD / ADHD Daughter   . Bipolar disorder Daughter   . Mental illness Son   . ADD / ADHD Son   . Bipolar disorder Son   . Mental illness Son   . ADD / ADHD Son   . Bipolar disorder Son     Social History   Social History Narrative   Patient lives at home with her husband.On disability since age  25 yrs.Currently living in a hotel,looking for permanent residence.   Social History   Tobacco Use  . Smoking status: Former Smoker    Packs/day: 0.20    Years: 32.00    Pack years: 6.40    Types: Cigarettes, Cigars    Start date: 07/05/1983    Quit date: 04/03/2012    Years since quitting: 7.0  . Smokeless tobacco: Never Used  Substance Use Topics  . Alcohol use: No    Alcohol/week: 0.0 standard drinks    Current Meds  Medication Sig  . ALPRAZolam (XANAX) 0.5 MG tablet Take 0.5 mg by mouth every morning.   [name] amLODipine (NORVASC) 10 MG tablet Take 1 tablet (10 mg total) by mouth daily.  [name] aspirin EC 81 MG tablet Take 162 mg by mouth 2 (two) times daily.  [name] atorvastatin (LIPITOR) 40 MG tablet Take 1 tablet (40 mg total) by mouth daily at 6 PM.  . clopidogrel (PLAVIX) 75 MG tablet Take 75 mg by mouth daily.   [name] escitalopram (LEXAPRO) 20 MG tablet Take 20 mg by mouth daily.   . fluticasone (FLONASE) 50 MCG/ACT nasal  spray Place 1 spray into both nostrils daily.  . isosorbide mononitrate (IMDUR) 30 MG 24 hr tablet Take 3 tablets (90 mg total) by mouth daily.  [name] LINZESS 145 MCG CAPS capsule Take 145 mcg by mouth daily.   [name] losartan (COZAAR) 50 MG tablet Take 1 tablet (50 mg total) by mouth daily.  . meclizine (ANTIVERT) 25 MG tablet Take 1 tablet (25 mg total) by mouth 3 (three) times daily as needed for dizziness.  . Medical ID Bracelet MISC 1 Units by Does not apply route daily.  . metoprolol tartrate (LOPRESSOR) 50 MG tablet Take 50 mg by mouth 2 (two) times daily.  [name] NITROSTAT 0.4 MG SL tablet PLACE 1 TAB UNDER TONGUE EVERY 5 MIN UP TO 3 TIMES AS NEEDED FOR CHEST PAIN, IF NO RELIEF CALL 911. (Patient taking differently: Place 0.4 mg under the tongue every 5 (five) minutes as needed for chest pain. )  . NONFORMULARY OR COMPOUNDED ITEM Inject 5 mg into the muscle every 7 (seven) days. Testosterone cypionate in olive  oil (25 mg/mL).  Dispense 5 mL vial.  . NP THYROID 90 MG tablet Take 90 mg by mouth daily.  . potassium chloride 20 MEQ/15ML (10%) SOLN Take 15 mLs by mouth daily.   Current Facility-Administered Medications for the 04/10/19 encounter (Office Visit) with [name], MD  Medication  . NONFORMULARY OR COMPOUNDED ITEM 0.2 mL      Objective:   Today's Vitals: There were no vitals taken for this visit. Vitals with BMI 04/03/2019 04/03/2019 03/29/2019  Height 5\' 4"  5\' 4"  5\' 4"   Weight 161 lbs 161 lbs 162 lbs 6 oz  BMI 27.62 123456 0000000  Systolic Q000111Q 0000000 -  Diastolic 90 88 -  Pulse 72 78 -     Physical Exam  Her speech is normal on the phone and she appears to be alert and orientated.     Assessment   1. Intractable vomiting with nausea, unspecified vomiting type   2. Epigastric pain       Tests ordered No orders of the defined types were placed in this encounter.    Plan: 1. I have sent a prescription for Zofran to see if this will calm her stomach down but I  have told her that if this does not help in the next 24 hours, she must  go to the emergency room to be further evaluated.  She agrees and will follow this advice.   Meds ordered this encounter  Medications  . ondansetron (ZOFRAN) 4 MG tablet    Sig: Take 1 tablet (4 mg total) by mouth every 8 (eight) hours as needed for nausea or vomiting.    Dispense:  30 tablet    Refill:  0    Celese Banner Luther Parody, MD

## 2019-04-11 ENCOUNTER — Other Ambulatory Visit: Payer: Self-pay

## 2019-04-11 ENCOUNTER — Ambulatory Visit (INDEPENDENT_AMBULATORY_CARE_PROVIDER_SITE_OTHER): Payer: Medicare Other

## 2019-04-11 ENCOUNTER — Ambulatory Visit (INDEPENDENT_AMBULATORY_CARE_PROVIDER_SITE_OTHER): Payer: Medicare Other | Admitting: Internal Medicine

## 2019-04-11 ENCOUNTER — Encounter (INDEPENDENT_AMBULATORY_CARE_PROVIDER_SITE_OTHER): Payer: Self-pay | Admitting: Internal Medicine

## 2019-04-11 VITALS — BP 132/88 | HR 88 | Temp 97.8°F | Resp 19 | Ht 67.0 in | Wt 163.2 lb

## 2019-04-11 DIAGNOSIS — R6882 Decreased libido: Secondary | ICD-10-CM

## 2019-04-11 DIAGNOSIS — J0100 Acute maxillary sinusitis, unspecified: Secondary | ICD-10-CM | POA: Diagnosis not present

## 2019-04-11 MED ORDER — AZITHROMYCIN 250 MG PO TABS: ORAL_TABLET | ORAL | 0 refills | Status: DC

## 2019-04-11 MED ORDER — PREDNISONE 20 MG PO TABS: 40.0000 mg | ORAL_TABLET | Freq: Every day | ORAL | 1 refills | Status: DC

## 2019-04-11 NOTE — Progress Notes (Signed)
Pt was given IM of 0.2Ml; to the right thigh. Pt tolerated well; no complaints.

## 2019-04-11 NOTE — Progress Notes (Signed)
Metrics: Intervention Frequency ACO  Documented Smoking Status Yearly  Screened one or more times in 24 months  Cessation Counseling or  Active cessation medication Past 24 months  Past 24 months   Guideline developer: UpToDate (See UpToDate for funding source) Date Released: 2014       Wellness Office Visit  Subjective:  Patient ID: Bethany Wang, female    DOB: [birth year]  Age: 53 y.o. MRN: [id number]  CC: This lady comes in for an acute visit with facial and nasal congestion and pain. HPI I done a telemedicine with her because she was having nausea and vomiting yesterday.  I did prescribe Zofran and I do not think she tolerated it very well but now she is tells me that she has got facial and nasal congestion and pain and she thinks this may be the cause of her problems.  She had also had some epistaxis in the last few days.  She denies any fever, cough, dyspnea.  She has not been in contact with anyone with COVID-19 disease.  Past Medical History:  Diagnosis Date  . Anemia   . Anxiety   . Bilateral breast cysts 12/30/2015  . Breast cancer ([place])    Left Breast Cancer  . BV (bacterial vaginosis) 09/09/2015  . Coronary artery disease   . Coronary atherosclerosis of native coronary artery    a. s/p DES to RCA in 2013 b. DES to LAD in 2014 c. cath in 04/2016 showing patent LAD stent with D2 jailed by LAD stent and CTO of RCA with left to right collaterals present  . Cyst of pharynx or nasopharynx    [name]  . Decreased libido 12/25/2018  . Depression   . Essential hypertension   . Falls    x 3-4 in past year  . GERD (gastroesophageal reflux disease)   . History of breast cancer 09/09/2015  . History of hematuria   . Mixed hyperlipidemia   . Personal history of chemotherapy    Left Breast Cancer  . Personal history of radiation therapy    Left Breast Cancer  . Pre-diabetes   . PUD (peptic ulcer disease)   . Seizures ([place])   . Shingles 09/09/2015  .  Stroke ([place])    12-2017 on Plavix, only deficit is headaches  . Suicide attempt ([place])    3 attempts in remote past  . Uterine cancer ([place])   . Vitamin D deficiency disease 04/03/2019      Family History  Problem Relation Age of Onset  . Colon cancer Paternal Grandfather   . Cancer Father   . Cancer Maternal Uncle   . Alzheimer's disease Paternal Aunt   . Cirrhosis Maternal Uncle   . Cancer Paternal Aunt        lung  . Heart disease Brother   . Heart attack Brother   . Mental illness Daughter   . ADD / ADHD Daughter   . Bipolar disorder Daughter   . Mental illness Son   . ADD / ADHD Son   . Bipolar disorder Son   . Mental illness Son   . ADD / ADHD Son   . Bipolar disorder Son     Social History   Social History Narrative   Patient lives at home with her husband.On disability since age 41 yrs.Currently living in a hotel,looking for permanent residence.   Social History   Tobacco Use  . Smoking status: Former Smoker    Packs/day: 0.20  Years: 32.00    Pack years: 6.40    Types: Cigarettes, Cigars    Start date: 07/05/1983    Quit date: 04/03/2012    Years since quitting: 7.0  . Smokeless tobacco: Never Used  Substance Use Topics  . Alcohol use: No    Alcohol/week: 0.0 standard drinks    Current Meds  Medication Sig  . ALPRAZolam (XANAX) 0.5 MG tablet Take 0.5 mg by mouth every morning.   [name] amLODipine (NORVASC) 10 MG tablet Take 1 tablet (10 mg total) by mouth daily.  [name] aspirin EC 81 MG tablet Take 162 mg by mouth 2 (two) times daily.  [name] atorvastatin (LIPITOR) 40 MG tablet Take 1 tablet (40 mg total) by mouth daily at 6 PM.  . clopidogrel (PLAVIX) 75 MG tablet Take 75 mg by mouth daily.   [name] escitalopram (LEXAPRO) 20 MG tablet Take 20 mg by mouth daily.   . fluticasone (FLONASE) 50 MCG/ACT nasal spray Place 1 spray into both nostrils daily.  . isosorbide mononitrate (IMDUR) 30 MG 24 hr tablet Take 3 tablets (90 mg total) by mouth daily.  [name] LINZESS 145 MCG  CAPS capsule Take 145 mcg by mouth daily.   [name] losartan (COZAAR) 50 MG tablet Take 1 tablet (50 mg total) by mouth daily.  . meclizine (ANTIVERT) 25 MG tablet Take 1 tablet (25 mg total) by mouth 3 (three) times daily as needed for dizziness.  . Medical ID Bracelet MISC 1 Units by Does not apply route daily.  . metoprolol tartrate (LOPRESSOR) 50 MG tablet Take 50 mg by mouth 2 (two) times daily.  [name] NITROSTAT 0.4 MG SL tablet PLACE 1 TAB UNDER TONGUE EVERY 5 MIN UP TO 3 TIMES AS NEEDED FOR CHEST PAIN, IF NO RELIEF CALL 911. (Patient taking differently: Place 0.4 mg under the tongue every 5 (five) minutes as needed for chest pain. )  . NONFORMULARY OR COMPOUNDED ITEM Inject 5 mg into the muscle every 7 (seven) days. Testosterone cypionate in olive  oil (25 mg/mL).  Dispense 5 mL vial.  . NP THYROID 90 MG tablet Take 90 mg by mouth daily.  . ondansetron (ZOFRAN) 4 MG tablet Take 1 tablet (4 mg total) by mouth every 8 (eight) hours as needed for nausea or vomiting.  . potassium chloride 20 MEQ/15ML (10%) SOLN Take 15 mLs by mouth daily.   Current Facility-Administered Medications for the 04/11/19 encounter (Office Visit) with [name], MD  Medication  . NONFORMULARY OR COMPOUNDED ITEM 0.2 mL      Objective:   Today's Vitals: BP 132/88 (BP Location: Right Arm, Patient Position: Sitting, Cuff Size: Normal)   Pulse 88   Temp 97.8 F (36.6 C) (Temporal)   Resp 19   Ht 5\' 7"  (1.702 m)   Wt 163 lb 3.2 oz (74 kg)   SpO2 96%   BMI 25.56 kg/m  Vitals with BMI 04/11/2019 04/10/2019 04/03/2019  Height 5\' 7"  (No Data) 5\' 4"   Weight 163 lbs 3 oz (No Data) 161 lbs  BMI [id number] - 123456  Systolic Q000111Q (No Data) Q000111Q  Diastolic 88 (No Data) 90  Pulse 88 - 72     Physical Exam  She looks systemically well.  She is very tender in the maxillary sinuses bilaterally.  Lung fields are entirely clear.  She is alert and orientated.     Assessment   1. Acute non-recurrent maxillary sinusitis         Tests ordered No orders of the  defined types were placed in this encounter.    Plan: 1. I am going to treat her with a combination of Zithromax and steroids as I believe there is an allergic response to this also.  If she does not improve, she will let me know and she is aware of possible side effects from steroids. 2. Today she was given her testosterone injection also.   Meds ordered this encounter  Medications  . azithromycin (ZITHROMAX) 250 MG tablet    Sig: Take 2 tablets the first day and then 1 tablet every day for the next 4 days    Dispense:  6 tablet    Refill:  0  . predniSONE (DELTASONE) 20 MG tablet    Sig: Take 2 tablets (40 mg total) by mouth daily with breakfast.    Dispense:  10 tablet    Refill:  1    Brielyn Bosak Luther Parody, MD

## 2019-04-17 ENCOUNTER — Ambulatory Visit (INDEPENDENT_AMBULATORY_CARE_PROVIDER_SITE_OTHER): Payer: Medicare Other

## 2019-04-17 ENCOUNTER — Other Ambulatory Visit: Payer: Self-pay

## 2019-04-17 DIAGNOSIS — R6882 Decreased libido: Secondary | ICD-10-CM

## 2019-04-17 NOTE — Progress Notes (Signed)
Pt was given 0.28mL IM to the left thigh. Pt tolerated well.

## 2019-04-24 ENCOUNTER — Ambulatory Visit (INDEPENDENT_AMBULATORY_CARE_PROVIDER_SITE_OTHER): Payer: Medicare Other

## 2019-04-24 ENCOUNTER — Other Ambulatory Visit (INDEPENDENT_AMBULATORY_CARE_PROVIDER_SITE_OTHER): Payer: Self-pay | Admitting: Internal Medicine

## 2019-04-24 MED ORDER — PREDNISONE 20 MG PO TABS: 40.0000 mg | ORAL_TABLET | Freq: Every day | ORAL | 1 refills | Status: DC

## 2019-04-25 ENCOUNTER — Ambulatory Visit (INDEPENDENT_AMBULATORY_CARE_PROVIDER_SITE_OTHER): Payer: Medicare Other

## 2019-04-26 ENCOUNTER — Telehealth (INDEPENDENT_AMBULATORY_CARE_PROVIDER_SITE_OTHER): Payer: Self-pay

## 2019-04-26 NOTE — Telephone Encounter (Signed)
ERROR

## 2019-04-27 ENCOUNTER — Ambulatory Visit: Payer: Medicare Other | Attending: Internal Medicine

## 2019-04-27 ENCOUNTER — Other Ambulatory Visit: Payer: Self-pay

## 2019-04-27 DIAGNOSIS — Z20822 Contact with and (suspected) exposure to covid-19: Secondary | ICD-10-CM

## 2019-04-28 LAB — NOVEL CORONAVIRUS, NAA: SARS-CoV-2, NAA: NOT DETECTED

## 2019-05-01 ENCOUNTER — Ambulatory Visit (INDEPENDENT_AMBULATORY_CARE_PROVIDER_SITE_OTHER): Payer: Medicare Other

## 2019-05-07 ENCOUNTER — Telehealth: Payer: Self-pay | Admitting: Cardiology

## 2019-05-07 MED ORDER — METOPROLOL TARTRATE 50 MG PO TABS: 75.0000 mg | ORAL_TABLET | Freq: Two times a day (BID) | ORAL | 6 refills | Status: DC

## 2019-05-07 NOTE — Telephone Encounter (Signed)
Patient c/o chest pain off/on x last 2-3 days.  Feels like something sitting on her chest & c/o really bad fatigue since this going on.  Feeling palpitations last 2-3 days - happens at any time.  Does note SOB & sweating during these episodes.  No fever & no covid exposure.  Was tested 2 wks ago with negative results.   States that this is the first time this has happened to her.  Explains them as coming in spurts.   Stated that she has taken 2 NTG & then if gets no relief - she takes 4 - 81mg  ASA afterwards & then goes to lay down. Just feels like something is not right.

## 2019-05-07 NOTE — Telephone Encounter (Signed)
Patient called stating that she has been having issues with heart racing . States that she can be watching TV and her heart starts racing.  Feeling fatigue with some shortness of breath. She spoke with her PCP and he told her he really didn't want her going into the hospital due to [place]. She has been taking Nitroglycerin and ASA for the past 2 days.

## 2019-05-07 NOTE — Telephone Encounter (Signed)
Left message to return call 

## 2019-05-07 NOTE — Telephone Encounter (Signed)
Consider increasing Lopressor to 75 mg twice daily.  She did wear a cardiac monitor last year that showed ectopy and brief episode of SVT, I wonder if she experiencing more of this.  If symptoms escalate and are more consistent with her angina, she should be evaluated in the ER, would suggest [facility] in case need to pursue invasive testing.

## 2019-05-07 NOTE — Telephone Encounter (Signed)
Patient notified and verbalized understanding.  New prescription sent to [facility] now.

## 2019-05-08 ENCOUNTER — Other Ambulatory Visit: Payer: Self-pay

## 2019-05-08 ENCOUNTER — Encounter (INDEPENDENT_AMBULATORY_CARE_PROVIDER_SITE_OTHER): Payer: Self-pay | Admitting: Internal Medicine

## 2019-05-08 ENCOUNTER — Ambulatory Visit (INDEPENDENT_AMBULATORY_CARE_PROVIDER_SITE_OTHER): Payer: Medicare Other | Admitting: Internal Medicine

## 2019-05-08 VITALS — BP 161/98 | Temp 97.4°F

## 2019-05-08 DIAGNOSIS — R079 Chest pain, unspecified: Secondary | ICD-10-CM

## 2019-05-08 DIAGNOSIS — R002 Palpitations: Secondary | ICD-10-CM

## 2019-05-08 MED ORDER — ALPRAZOLAM 0.5 MG PO TABS: 0.5000 mg | ORAL_TABLET | Freq: Every morning | ORAL | 0 refills | Status: DC

## 2019-05-08 NOTE — Progress Notes (Signed)
Metrics: Intervention Frequency ACO  Documented Smoking Status Yearly  Screened one or more times in 24 months  Cessation Counseling or  Active cessation medication Past 24 months  Past 24 months   Guideline developer: UpToDate (See UpToDate for funding source) Date Released: 2014       Wellness Office Visit  Subjective:  Patient ID: Bethany Wang, female    DOB: [birth year]  Age: 54 y.o. MRN: [id number]  CC: This patient was seen in the office as an acute visit when she came to have her testosterone injection. She was complaining of chest pain and palpitations. HPI  On closer questioning, she tells me that she has had the symptoms for the last 3 days.  She has been under a lot of stress mainly relating to her husband's behavior. She describes palpitations as a beating out of her chest together with chest pain that she has had previously. She underwent an EKG in the office whilst she was having palpitations.  I reviewed the EKG. Past Medical History:  Diagnosis Date  . Anemia   . Anxiety   . Bilateral breast cysts 12/30/2015  . Breast cancer ([place])    Left Breast Cancer  . BV (bacterial vaginosis) 09/09/2015  . Coronary artery disease   . Coronary atherosclerosis of native coronary artery    a. s/p DES to RCA in 2013 b. DES to LAD in 2014 c. cath in 04/2016 showing patent LAD stent with D2 jailed by LAD stent and CTO of RCA with left to right collaterals present  . Cyst of pharynx or nasopharynx    [name]  . Decreased libido 12/25/2018  . Depression   . Essential hypertension   . Falls    x 3-4 in past year  . GERD (gastroesophageal reflux disease)   . History of breast cancer 09/09/2015  . History of hematuria   . Mixed hyperlipidemia   . Personal history of chemotherapy    Left Breast Cancer  . Personal history of radiation therapy    Left Breast Cancer  . Pre-diabetes   . PUD (peptic ulcer disease)   . Seizures ([place])   . Shingles 09/09/2015  .  Stroke ([place])    12-2017 on Plavix, only deficit is headaches  . Suicide attempt ([place])    3 attempts in remote past  . Uterine cancer ([place])   . Vitamin D deficiency disease 04/03/2019      Family History  Problem Relation Age of Onset  . Colon cancer Paternal Grandfather   . Cancer Father   . Cancer Maternal Uncle   . Alzheimer's disease Paternal Aunt   . Cirrhosis Maternal Uncle   . Cancer Paternal Aunt        lung  . Heart disease Brother   . Heart attack Brother   . Mental illness Daughter   . ADD / ADHD Daughter   . Bipolar disorder Daughter   . Mental illness Son   . ADD / ADHD Son   . Bipolar disorder Son   . Mental illness Son   . ADD / ADHD Son   . Bipolar disorder Son     Social History   Social History Narrative   Patient lives at home with her husband.On disability since age 39 yrs.Currently living in a hotel,looking for permanent residence.   Social History   Tobacco Use  . Smoking status: Former Smoker    Packs/day: 0.20    Years: 32.00    Pack years:  6.40    Types: Cigarettes, Cigars    Start date: 07/05/1983    Quit date: 04/03/2012    Years since quitting: 7.0  . Smokeless tobacco: Never Used  Substance Use Topics  . Alcohol use: No    Alcohol/week: 0.0 standard drinks    Current Meds  Medication Sig  . ALPRAZolam (XANAX) 0.5 MG tablet Take 1 tablet (0.5 mg total) by mouth every morning.  [name] amLODipine (NORVASC) 10 MG tablet Take 1 tablet (10 mg total) by mouth daily.  [name] aspirin EC 81 MG tablet Take 162 mg by mouth 2 (two) times daily.  [name] atorvastatin (LIPITOR) 40 MG tablet Take 1 tablet (40 mg total) by mouth daily at 6 PM.  . clopidogrel (PLAVIX) 75 MG tablet Take 75 mg by mouth daily.   [name] escitalopram (LEXAPRO) 20 MG tablet Take 20 mg by mouth daily.   . fluticasone (FLONASE) 50 MCG/ACT nasal spray Place 1 spray into both nostrils daily.  . isosorbide mononitrate (IMDUR) 30 MG 24 hr tablet Take 3 tablets (90 mg total) by mouth daily.  [name]  LINZESS 145 MCG CAPS capsule Take 145 mcg by mouth daily.   [name] losartan (COZAAR) 50 MG tablet Take 1 tablet (50 mg total) by mouth daily.  . meclizine (ANTIVERT) 25 MG tablet Take 1 tablet (25 mg total) by mouth 3 (three) times daily as needed for dizziness.  . Medical ID Bracelet MISC 1 Units by Does not apply route daily.  . metoprolol tartrate (LOPRESSOR) 50 MG tablet Take 1.5 tablets (75 mg total) by mouth 2 (two) times daily.  [name] NITROSTAT 0.4 MG SL tablet PLACE 1 TAB UNDER TONGUE EVERY 5 MIN UP TO 3 TIMES AS NEEDED FOR CHEST PAIN, IF NO RELIEF CALL 911. (Patient taking differently: Place 0.4 mg under the tongue every 5 (five) minutes as needed for chest pain. )  . NONFORMULARY OR COMPOUNDED ITEM Inject 5 mg into the muscle every 7 (seven) days. Testosterone cypionate in olive  oil (25 mg/mL).  Dispense 5 mL vial.  . NP THYROID 90 MG tablet Take 90 mg by mouth daily.  . ondansetron (ZOFRAN) 4 MG tablet Take 1 tablet (4 mg total) by mouth every 8 (eight) hours as needed for nausea or vomiting.  . potassium chloride 20 MEQ/15ML (10%) SOLN Take 15 mLs by mouth daily.  . [DISCONTINUED] ALPRAZolam (XANAX) 0.5 MG tablet Take 0.5 mg by mouth every morning.   . [DISCONTINUED] azithromycin (ZITHROMAX) 250 MG tablet Take 2 tablets the first day and then 1 tablet every day for the next 4 days  . [DISCONTINUED] predniSONE (DELTASONE) 20 MG tablet Take 2 tablets (40 mg total) by mouth daily with breakfast.   Current Facility-Administered Medications for the 05/08/19 encounter (Office Visit) with [name], MD  Medication  . NONFORMULARY OR COMPOUNDED ITEM 0.2 mL       Objective:   Today's Vitals: BP (!) 161/98 (BP Location: Right Arm, Patient Position: Sitting, Cuff Size: Normal)   Temp (!) 97.4 F (36.3 C) (Temporal)  Vitals with BMI 05/08/2019 04/11/2019 04/10/2019  Height - 5\' 7"  (No Data)  Weight - 163 lbs 3 oz (No Data)  BMI - [id number] -  Systolic Q000111Q Q000111Q (No Data)  Diastolic 98 88 (No  Data)  Pulse - 88 -     Physical Exam  She looks systemically well and does not appear to be in any significant pain.  Her resting heart rate is around 70 to 80/min.  Heart sounds are present without gallop rhythm.  Lung fields are entirely clear.  There is no pleural rub or pericardial rub.     Assessment   1. Chest pain, unspecified type   2. Palpitations       Tests ordered Orders Placed This Encounter  Procedures  . EKG 12-Lead     Plan: 1. Electrocardiogram shows sinus rhythm with a heart rate of 76/min and this is with symptoms of palpitations.  There is no ST-T wave elevation/new changes. 2. I reassured her that I do not think she is currently having acute coronary syndrome and I think this is mostly stress related.  I have given her a prescription of Xanax which she does not like to use but I told her that she should probably use this on a daily basis for the next few days until things calm down. 3. Of course, if she continues to have chest pain and palpitations she will have to go to the emergency room.  She understands this.   Meds ordered this encounter  Medications  . ALPRAZolam (XANAX) 0.5 MG tablet    Sig: Take 1 tablet (0.5 mg total) by mouth every morning.    Dispense:  30 tablet    Refill:  0    Nimish Luther Parody, MD

## 2019-05-09 LAB — HM DIABETES EYE EXAM

## 2019-05-15 ENCOUNTER — Other Ambulatory Visit: Payer: Self-pay

## 2019-05-15 ENCOUNTER — Ambulatory Visit (INDEPENDENT_AMBULATORY_CARE_PROVIDER_SITE_OTHER): Payer: Medicare Other

## 2019-05-15 DIAGNOSIS — R6882 Decreased libido: Secondary | ICD-10-CM

## 2019-05-15 NOTE — Progress Notes (Signed)
Pt given 0.66ml to the right thigh. Pt tolerated well, no complaint.

## 2019-05-22 ENCOUNTER — Ambulatory Visit (INDEPENDENT_AMBULATORY_CARE_PROVIDER_SITE_OTHER): Payer: Medicare Other

## 2019-05-22 ENCOUNTER — Other Ambulatory Visit: Payer: Self-pay

## 2019-05-22 VITALS — Ht 67.0 in | Wt 163.0 lb

## 2019-05-22 DIAGNOSIS — R6882 Decreased libido: Secondary | ICD-10-CM

## 2019-05-22 NOTE — Progress Notes (Signed)
Pt was given 0.82ml IM to the right thigh.

## 2019-05-29 ENCOUNTER — Ambulatory Visit (INDEPENDENT_AMBULATORY_CARE_PROVIDER_SITE_OTHER): Payer: Medicare Other

## 2019-05-29 ENCOUNTER — Other Ambulatory Visit: Payer: Self-pay

## 2019-05-29 DIAGNOSIS — R6882 Decreased libido: Secondary | ICD-10-CM

## 2019-05-29 NOTE — Progress Notes (Signed)
Pt was given 0.7mL to the left thigh. Pt tolerated well; no complaints. Pt will need a new refill of medication.

## 2019-06-05 ENCOUNTER — Other Ambulatory Visit: Payer: Self-pay

## 2019-06-05 ENCOUNTER — Ambulatory Visit (INDEPENDENT_AMBULATORY_CARE_PROVIDER_SITE_OTHER): Payer: Medicare Other

## 2019-06-05 DIAGNOSIS — R6882 Decreased libido: Secondary | ICD-10-CM

## 2019-06-05 NOTE — Progress Notes (Signed)
Patient had testosterone inj in the right thigh and tolerated the injection well

## 2019-06-11 ENCOUNTER — Telehealth (INDEPENDENT_AMBULATORY_CARE_PROVIDER_SITE_OTHER): Payer: Self-pay

## 2019-06-11 DIAGNOSIS — R6882 Decreased libido: Secondary | ICD-10-CM

## 2019-06-11 MED ORDER — NONFORMULARY OR COMPOUNDED ITEM: 5.0000 mg | 3 refills | Status: DC

## 2019-06-11 NOTE — Telephone Encounter (Signed)
LeighAnn Monike Bragdon, CMA  

## 2019-06-12 ENCOUNTER — Ambulatory Visit (INDEPENDENT_AMBULATORY_CARE_PROVIDER_SITE_OTHER): Payer: Medicare Other

## 2019-06-12 ENCOUNTER — Ambulatory Visit (INDEPENDENT_AMBULATORY_CARE_PROVIDER_SITE_OTHER): Payer: Medicare Other | Admitting: Nurse Practitioner

## 2019-06-12 ENCOUNTER — Encounter (INDEPENDENT_AMBULATORY_CARE_PROVIDER_SITE_OTHER): Payer: Self-pay | Admitting: Nurse Practitioner

## 2019-06-12 VITALS — BP 172/96 | HR 92

## 2019-06-12 DIAGNOSIS — R519 Headache, unspecified: Secondary | ICD-10-CM

## 2019-06-12 DIAGNOSIS — R110 Nausea: Secondary | ICD-10-CM

## 2019-06-12 DIAGNOSIS — I10 Essential (primary) hypertension: Secondary | ICD-10-CM | POA: Diagnosis not present

## 2019-06-12 MED ORDER — LOSARTAN POTASSIUM 100 MG PO TABS: 100.0000 mg | ORAL_TABLET | Freq: Every day | ORAL | Status: DC

## 2019-06-12 MED ORDER — METOCLOPRAMIDE HCL 5 MG PO TABS: 5.0000 mg | ORAL_TABLET | Freq: Three times a day (TID) | ORAL | 2 refills | Status: DC

## 2019-06-12 NOTE — Progress Notes (Addendum)
Due to national recommendations of social distancing related to the [place] pandemic, an tele-health visit was felt to be the most appropriate encounter type for this patient today. I connected with  Bethany Wang on 06/12/19 utilizing audio-only technology and verified that I am speaking with the correct person using two identifiers. The patient was located at their home, and I was located at the office of [facility] during the encounter. I discussed the limitations of evaluation and management by telemedicine. The patient expressed understanding and agreed to proceed.    Subjective:  Patient ID: Bethany Wang, female    DOB: Nov 15, 1965  Age: 54 y.o. MRN: [id number]  CC:  Chief Complaint  Patient presents with  . Headache  . Abdominal Pain      HPI  This patient arrives for virtual visit for the above today.  Headache: She tells me that she is been having a headache intermittently for the last 2 months.  Tells me it is quite severe.  It causes her to have to lay down in a dark room for almost 2 days until she starts to feel better.  She tells me she does have visual changes and actually loses her vision during the headache, but not before the headache.  She will use over-the-counter medications such as BC Goody's powder or Aleve with only minimal improvement in her headaches.  She does have a history of a brain tumor and seizures.  She used to see neurology for this, but tells me she has not followed up with them in 4 years.  She also has a history of coronary artery disease.  Abdominal pain: She also tells me that she is been having abdominal pain with vomiting and sometimes with nausea over the last 2 months.  She tells me this normally occurs when she eats food.  She tells me anytime she eats meat or veggies that are dressed with either spices or toppings she will vomit it up.  Sometimes she gets nauseated in addition to the vomiting but some time she only experiences cold  sweats before the vomiting.  She tells me if she eats plain vegetables, fruits, or liquids she does not vomit.  She tells me her bowel movements have been regular and she is having approximately 2 bowel movements per day which is normal for her.   Past Medical History:  Diagnosis Date  . Anemia   . Anxiety   . Bilateral breast cysts 12/30/2015  . Breast cancer ([place])    Left Breast Cancer  . BV (bacterial vaginosis) 09/09/2015  . Coronary artery disease   . Coronary atherosclerosis of native coronary artery    a. s/p DES to RCA in 2013 b. DES to LAD in 2014 c. cath in 04/2016 showing patent LAD stent with D2 jailed by LAD stent and CTO of RCA with left to right collaterals present  . Cyst of pharynx or nasopharynx    [name]  . Decreased libido 12/25/2018  . Depression   . Essential hypertension   . Falls    x 3-4 in past year  . GERD (gastroesophageal reflux disease)   . History of breast cancer 09/09/2015  . History of hematuria   . Mixed hyperlipidemia   . Personal history of chemotherapy    Left Breast Cancer  . Personal history of radiation therapy    Left Breast Cancer  . Pre-diabetes   . PUD (peptic ulcer disease)   . Seizures ([place])   .  Shingles 09/09/2015  . Stroke ([place])    12-2017 on Plavix, only deficit is headaches  . Suicide attempt ([place])    3 attempts in remote past  . Uterine cancer ([place])   . Vitamin D deficiency disease 04/03/2019      Family History  Problem Relation Age of Onset  . Colon cancer Paternal Grandfather   . Cancer Father   . Cancer Maternal Uncle   . Alzheimer's disease Paternal Aunt   . Cirrhosis Maternal Uncle   . Cancer Paternal Aunt        lung  . Heart disease Brother   . Heart attack Brother   . Mental illness Daughter   . ADD / ADHD Daughter   . Bipolar disorder Daughter   . Mental illness Son   . ADD / ADHD Son   . Bipolar disorder Son   . Mental illness Son   . ADD / ADHD Son   . Bipolar disorder Son       Social History   Social History Narrative   Patient lives at home with her husband.On disability since age 22 yrs.Currently living in a hotel,looking for permanent residence.   Social History   Tobacco Use  . Smoking status: Former Smoker    Packs/day: 0.20    Years: 32.00    Pack years: 6.40    Types: Cigarettes, Cigars    Start date: 07/05/1983    Quit date: 04/03/2012    Years since quitting: 7.1  . Smokeless tobacco: Never Used  Substance Use Topics  . Alcohol use: No    Alcohol/week: 0.0 standard drinks     No outpatient medications have been marked as taking for the 06/12/19 encounter (Office Visit) with Ailene Ards, NP.   Current Facility-Administered Medications for the 06/12/19 encounter (Office Visit) with Ailene Ards, NP  Medication  . NONFORMULARY OR COMPOUNDED ITEM 0.2 mL    ROS:  Review of Systems  Constitutional: Negative for fever, malaise/fatigue and weight loss.  Eyes:       (+) vision changes with headaches  Respiratory: Negative for cough, shortness of breath and wheezing.   Cardiovascular: Negative for chest pain.  Gastrointestinal: Positive for abdominal pain and vomiting (diaphoresis). Negative for blood in stool, constipation, diarrhea and heartburn.  Genitourinary: Negative for hematuria.  Neurological: Positive for headaches.     Objective:   Today's Vitals: BP (!) 172/96   Pulse 92  Vitals with BMI 06/12/2019 05/22/2019 05/08/2019  Height - 5\' 7"  (No Data)  Weight - 163 lbs (No Data)  BMI - A999333 -  Systolic Q000111Q - Q000111Q  Diastolic 96 - 98  Pulse 92 - -     Physical Exam Comprehensive physical exam not completed today as patient's visit was completed remotely.  She did sound well over the phone, she answered questions appropriately, she was alert and oriented x3, and appeared to have appropriate judgment.  Of note, her blood pressure is elevated, the recording is from her at home blood pressure monitor and was collected  yesterday.      Assessment   1. Essential hypertension, benign   2. Nausea   3. Nonintractable headache, unspecified chronicity pattern, unspecified headache type       Tests ordered Orders Placed This Encounter  Procedures  . Ambulatory referral to Gastroenterology  . Ambulatory referral to Neurology     Plan: 1.  Upon further investigation she tells me that her blood pressure has been running elevated at home  it has been approximately in the [id number] systolically.  I recommended that we increase her losartan from 50 mg to 100 mg daily.  She will follow-up in 2 weeks for blood pressure check as well as to recheck metabolic panel.  2.  I will start her on Reglan that she can take with meals to see if this helps with her nausea and vomiting.  I did warn her about possibility of extrapyramidal side effects, and if these do occur that she needs to stop the medication right away and let us know. Due to the fact that she is having some abdominal pain and this is been going on for 2 months I am going to refer her to gastroenterology for further evaluation.  3.  I am concerned about her headaches due to their severity as well as the visual changes that are occurring with her headaches.  It does not sound like she is experiencing a migraine with aura as the visual changes occur with the headaches but not prior to the headaches.  In addition due to her personal history of a brain tumor (which I do not see in her chart but I will add it now), I recommended that she follow-up with her neurologist.  I will send a referral to neurology, but I encouraged her to call her neurologist herself to see if she can be seen soon.  In the meantime I told her she can continue to use her over-the-counter medications, but she should try to eat a small snack with them to protect her stomach.  I did tell her that the medication that she takes can increase her risk for gastric ulcer and/or bleeding, and that she should  watch out for any signs of bleeding.  Of note, I also did recommend that she be retested for COVID-19 virus.  My suspicion for this infection is low based on her history, however headache and nausea can be associated with this virus.  She tells me she will consider this.  Meds ordered this encounter  Medications  . losartan (COZAAR) 100 MG tablet    Sig: Take 1 tablet (100 mg total) by mouth daily.    Order Specific Question:   Supervising Provider    Answer:   [name] [id number]  . metoCLOPramide (REGLAN) 5 MG tablet    Sig: Take 1 tablet (5 mg total) by mouth 3 (three) times daily before meals.    Dispense:  90 tablet    Refill:  2    Order Specific Question:   Supervising Provider    Answer:   [name] [id number]    Patient to follow-up in 2 weeks. This telephone encounter lasted for 21 minutes.   Ailene Ards, NP

## 2019-06-19 ENCOUNTER — Telehealth (INDEPENDENT_AMBULATORY_CARE_PROVIDER_SITE_OTHER): Payer: Self-pay

## 2019-06-19 ENCOUNTER — Ambulatory Visit (INDEPENDENT_AMBULATORY_CARE_PROVIDER_SITE_OTHER): Payer: Medicare Other

## 2019-06-20 NOTE — Telephone Encounter (Signed)
Verbal order was called by Dr [name] to custom care.

## 2019-06-26 ENCOUNTER — Other Ambulatory Visit: Payer: Self-pay

## 2019-06-26 ENCOUNTER — Ambulatory Visit (INDEPENDENT_AMBULATORY_CARE_PROVIDER_SITE_OTHER): Payer: Medicare Other | Admitting: Internal Medicine

## 2019-06-26 ENCOUNTER — Ambulatory Visit (INDEPENDENT_AMBULATORY_CARE_PROVIDER_SITE_OTHER): Payer: Medicare Other

## 2019-06-26 DIAGNOSIS — R6882 Decreased libido: Secondary | ICD-10-CM

## 2019-06-26 NOTE — Progress Notes (Signed)
Pt given the 0.2 ML to the left thigh. Pt tolerated well.

## 2019-07-02 ENCOUNTER — Encounter (INDEPENDENT_AMBULATORY_CARE_PROVIDER_SITE_OTHER): Payer: Self-pay | Admitting: Internal Medicine

## 2019-07-02 ENCOUNTER — Other Ambulatory Visit: Payer: Self-pay

## 2019-07-02 ENCOUNTER — Ambulatory Visit (INDEPENDENT_AMBULATORY_CARE_PROVIDER_SITE_OTHER): Payer: Medicare Other | Admitting: Internal Medicine

## 2019-07-02 VITALS — BP 140/90 | HR 79 | Temp 99.2°F | Wt 167.4 lb

## 2019-07-02 DIAGNOSIS — E119 Type 2 diabetes mellitus without complications: Secondary | ICD-10-CM

## 2019-07-02 DIAGNOSIS — R6882 Decreased libido: Secondary | ICD-10-CM

## 2019-07-02 DIAGNOSIS — F411 Generalized anxiety disorder: Secondary | ICD-10-CM

## 2019-07-02 DIAGNOSIS — I10 Essential (primary) hypertension: Secondary | ICD-10-CM | POA: Diagnosis not present

## 2019-07-02 DIAGNOSIS — I25119 Atherosclerotic heart disease of native coronary artery with unspecified angina pectoris: Secondary | ICD-10-CM | POA: Diagnosis not present

## 2019-07-02 DIAGNOSIS — E782 Mixed hyperlipidemia: Secondary | ICD-10-CM

## 2019-07-02 DIAGNOSIS — Z0001 Encounter for general adult medical examination with abnormal findings: Secondary | ICD-10-CM | POA: Diagnosis not present

## 2019-07-02 DIAGNOSIS — E559 Vitamin D deficiency, unspecified: Secondary | ICD-10-CM

## 2019-07-02 DIAGNOSIS — H9193 Unspecified hearing loss, bilateral: Secondary | ICD-10-CM

## 2019-07-02 NOTE — Progress Notes (Signed)
Chief Complaint: This 54 year old lady comes in for an annual physical exam and to address her chronic conditions which are described below. HPI: She does have a history of diabetes in the past but after she has lost weight, she is no longer on any medications and apparently her A1c was much improved.  She now tells me that her blood glucose levels are in the 170 range more recently. She is frustrated by not seen the physician at the cardiology practice.  She would like to do this.  She does have a history of coronary artery disease and has had frequent episodes of chest discomfort. She continues with testosterone therapy for hot flashes, fatigue, decreased libido and initially this has been helping her but now she finds that after a few days, her energy levels tend to wane.  Her last injection was 6 days ago.  She takes the injections once a week.  I have not started her on estradiol or progesterone because of her previous history of breast cancer. She is hypertensive and losartan had to be increased on the last visit that she was seen in this office via telemedicine.  She feels somewhat groggy with blood pressure medicines apparently. She continues on statin therapy in the face of coronary artery disease.  She denies any significant myalgia. She does describe some wrist pain on the right side with a tingling sensation.  She sometimes gets swelling also on the side. She also describes bilateral hearing loss and she feels that her hearing is somewhat muffled.  She has not seen an ENT doctor for a long time.  Past Medical History:  Diagnosis Date  . Anemia   . Anxiety   . Bilateral breast cysts 12/30/2015  . Brain tumor ([place])   . Breast cancer ([place])    Left Breast Cancer  . BV (bacterial vaginosis) 09/09/2015  . Coronary artery disease   . Coronary atherosclerosis of native coronary artery    a. s/p DES to RCA in 2013 b. DES to LAD in 2014 c. cath in 04/2016 showing patent LAD stent with D2  jailed by LAD stent and CTO of RCA with left to right collaterals present  . Cyst of pharynx or nasopharynx    [name]  . Decreased libido 12/25/2018  . Depression   . Essential hypertension   . Falls    x 3-4 in past year  . GERD (gastroesophageal reflux disease)   . History of breast cancer 09/09/2015  . History of hematuria   . Mixed hyperlipidemia   . Personal history of chemotherapy    Left Breast Cancer  . Personal history of radiation therapy    Left Breast Cancer  . Pre-diabetes   . PUD (peptic ulcer disease)   . Seizures ([place])   . Shingles 09/09/2015  . Stroke ([place])    12-2017 on Plavix, only deficit is headaches  . Suicide attempt ([place])    3 attempts in remote past  . Uterine cancer ([place])   . Vitamin D deficiency disease 04/03/2019   Past Surgical History:  Procedure Laterality Date  . ABDOMINAL HYSTERECTOMY    . BLADDER SURGERY    . BREAST LUMPECTOMY Left   . BREAST LUMPECTOMY Right 02/24/2018   Procedure: RIGHT BREAST LUMPECTOMY ERAS PATHWAY;  Surgeon: Jovita Kussmaul, MD;  Location: [facility];  Service: General;  Laterality: Right;  . CARDIAC CATHETERIZATION N/A 05/10/2016   Procedure: Left Heart Cath and Coronary Angiography;  Surgeon: Belva Crome, MD;  Location: [place] [facility] [facility];  Service: Cardiovascular;  Laterality: N/A;  . COLONOSCOPY  May 2010   Fleishman: normal rectum, internal hemorrhoids, , benign colonic polyp  . COLONOSCOPY N/A 01/05/2017   Procedure: COLONOSCOPY;  Surgeon: Rogene Houston, MD;  [facility];  Service: Endoscopy;  Laterality: N/A;  1:00  . ESOPHAGOGASTRODUODENOSCOPY     2001 Dr. Amedeo Plenty: distal esophagitis, small hiatal hernia,. Dr. Tamala Julian 2006? no records available currently, pt also reports  EGD a few years ago with Dr. Gala Romney, do not have these reports anywhere in medical records  . ESOPHAGOGASTRODUODENOSCOPY  06/02/2011   [id number] pill impaction as described above s/p dilation of a probable cervical  esophageal web/bx abnormal esophageal and gastric mucosa. + H.pylori gastritis   . EYE SURGERY     Removed glass  . HAND SURGERY Right   . Left breast lumpectomy     Benign  . LEFT HEART CATH AND CORONARY ANGIOGRAPHY N/A 10/25/2018   Procedure: LEFT HEART CATH AND CORONARY ANGIOGRAPHY;  Surgeon: Wellington Hampshire, MD;  Location: [place] [facility];  Service: Cardiovascular;  Laterality: N/A;  . LEFT HEART CATHETERIZATION WITH CORONARY ANGIOGRAM N/A 07/03/2014   Procedure: LEFT HEART CATHETERIZATION WITH CORONARY ANGIOGRAM;  Surgeon: Belva Crome, MD;  Location: [facility] CATH LAB;  Service: Cardiovascular;  Laterality: N/A;  . POLYPECTOMY  01/05/2017   Procedure: POLYPECTOMY;  Surgeon: Rogene Houston, MD;  [facility];  Service: Endoscopy;;  colon  . SHOULDER SURGERY Left      Social History   Social History Narrative   Patient lives at home with her husband.On disability since age 60 yrs.Currently living in a hotel,looking for permanent residence.    Social History   Tobacco Use  . Smoking status: Former Smoker    Packs/day: 0.20    Years: 32.00    Pack years: 6.40    Types: Cigarettes, Cigars    Start date: 07/05/1983    Quit date: 04/03/2012    Years since quitting: 7.2  . Smokeless tobacco: Never Used  Substance Use Topics  . Alcohol use: No    Alcohol/week: 0.0 standard drinks      Allergies:  Allergies  Allergen Reactions  . Bee Venom Anaphylaxis and Other (See Comments)    "throat swelled and had to the hospital" - Yellow jacket sting  . Penicillins Anaphylaxis and Other (See Comments)    Has patient had a PCN reaction causing immediate rash, facial/tongue/throat swelling, SOB or lightheadedness with hypotension: Yes Has patient had a PCN reaction causing severe rash involving mucus membranes or skin necrosis: Yes Has patient had a PCN reaction that required hospitalization Yes Has patient had a PCN reaction occurring within the last 10 years: Yes If all  of the above answers are "NO", then may proceed with Cephalosporin use. Patient states she carries an epi-pen  . Shellfish Allergy Swelling     Current Meds  Medication Sig  . ALPRAZolam (XANAX) 0.5 MG tablet Take 1 tablet (0.5 mg total) by mouth every morning.  [name] amLODipine (NORVASC) 10 MG tablet Take 1 tablet (10 mg total) by mouth daily.  [name] aspirin EC 81 MG tablet Take 162 mg by mouth 2 (two) times daily.  [name] atorvastatin (LIPITOR) 40 MG tablet Take 1 tablet (40 mg total) by mouth daily at 6 PM.  . Cholecalciferol (VITAMIN D3) 25 MCG (1000 UT) CHEW Chew 2,000 Units by mouth daily.  . clopidogrel (PLAVIX) 75 MG tablet Take 75 mg  by mouth daily.   [name] escitalopram (LEXAPRO) 20 MG tablet Take 20 mg by mouth daily.   . fluticasone (FLONASE) 50 MCG/ACT nasal spray Place 1 spray into both nostrils daily.  . isosorbide mononitrate (IMDUR) 30 MG 24 hr tablet Take 3 tablets (90 mg total) by mouth daily.  [name] LINZESS 145 MCG CAPS capsule Take 145 mcg by mouth daily.   [name] losartan (COZAAR) 100 MG tablet Take 1 tablet (100 mg total) by mouth daily.  . Medical ID Bracelet MISC 1 Units by Does not apply route daily.  . metoprolol tartrate (LOPRESSOR) 50 MG tablet Take 1.5 tablets (75 mg total) by mouth 2 (two) times daily.  [name] NITROSTAT 0.4 MG SL tablet PLACE 1 TAB UNDER TONGUE EVERY 5 MIN UP TO 3 TIMES AS NEEDED FOR CHEST PAIN, IF NO RELIEF CALL 911. (Patient taking differently: Place 0.4 mg under the tongue every 5 (five) minutes as needed for chest pain. )  . NONFORMULARY OR COMPOUNDED ITEM Inject 5 mg into the muscle every 7 (seven) days. Testosterone cypionate in olive  oil (25 mg/mL).  Dispense 5 mL vial.  . NP THYROID 90 MG tablet Take 90 mg by mouth daily.  . potassium chloride 20 MEQ/15ML (10%) SOLN Take 15 mLs by mouth daily.       [id number] from the symptoms mentioned above,there are no other symptoms referable to all systems reviewed.  Physical Exam: There were no vitals taken for this  visit. Vitals with BMI 07/02/2019 06/12/2019 05/22/2019  Height - - 5\' 7"   Weight 167 lbs 6 oz - 163 lbs  BMI - - A999333  Systolic [id number] Q000111Q -  Diastolic 90 96 -  Pulse 79 92 -      She looks systemically well.  Blood pressure has improved since the last time. General: Alert, cooperative, and appears to be the stated age.No pallor.  No jaundice.  No clubbing. Head: Normocephalic Eyes: Sclera white, pupils equal and reactive to light, red reflex x 2,  Ears: Normal bilaterally Oral cavity: Lips, mucosa, and tongue normal: Teeth and gums normal Neck: No adenopathy, supple, symmetrical, trachea midline, and thyroid does not appear enlarged. Breast: No masses felt. Respiratory: Clear to auscultation bilaterally.No wheezing, crackles or bronchial breathing. Cardiovascular: Heart sounds are present and appear to be normal without murmurs or added sounds.  No carotid bruits.  Peripheral pulses are present and equal bilaterally.: Gastrointestinal:positive bowel sounds, no hepatosplenomegaly.  No masses felt.No tenderness. Skin: Clear, No rashes noted.No worrisome skin lesions seen. Neurological: Grossly intact without focal findings, cranial nerves II through XII intact, muscle strength equal bilaterally Musculoskeletal: No acute joint abnormalities noted.Full range of movement noted with joints. Psychiatric: Affect appropriate, non-anxious.    Assessment  1. Essential hypertension, benign   2. Atherosclerosis of native coronary artery of native heart with angina pectoris ([place])   3. Mixed hyperlipidemia   4. Vitamin D deficiency disease   5. Anxiety state   6. Bilateral hearing loss, unspecified hearing loss type   7. Encounter for general adult medical examination with abnormal findings   8. Decreased libido   9. Diabetes mellitus without complication ([place])     Tests Ordered:   Orders Placed This Encounter  Procedures  . CBC  . COMPLETE METABOLIC PANEL WITH GFR  . Testos,Total,Free  and SHBG (Female)  . VITAMIN D 25 Hydroxy (Vit-D Deficiency, Fractures)  . T3, free  . TSH  . Lipid panel  . Hemoglobin A1c  . Ambulatory referral to  ENT  . Ambulatory referral to Cardiology     Plan  1. Blood work is ordered above. 2. She will continue with all medications for the time being but I wonder if we may need to increase testosterone therapy to twice a week. 3. She will continue with statin therapy for hyperlipidemia.  We will check levels today. 4. As far as a history of diabetes is concerned, I will check an A1c. 5. I will refer to ENT for her hearing loss. 6. I will refer to cardiology again with a specific direction to see the physician. 7. We discussed a plant-based diet, exercise and also meditation, all of these things should help her blood pressure. 8. Further recommendations will depend on blood results and I will see her in about 2 months time for follow-up. 9. In addition to a preventative visit, I independently performed an office visit to address her chronic conditions above.     No orders of the defined types were placed in this encounter.    Nimish C Gosrani   07/02/2019, 11:48 AM

## 2019-07-03 ENCOUNTER — Ambulatory Visit (INDEPENDENT_AMBULATORY_CARE_PROVIDER_SITE_OTHER): Payer: Medicare Other

## 2019-07-03 DIAGNOSIS — R6882 Decreased libido: Secondary | ICD-10-CM

## 2019-07-05 ENCOUNTER — Ambulatory Visit: Payer: Medicare Other | Attending: Internal Medicine

## 2019-07-05 DIAGNOSIS — Z23 Encounter for immunization: Secondary | ICD-10-CM

## 2019-07-05 LAB — CBC
HCT: 44 % (ref 35.0–45.0)
Hemoglobin: 13.9 g/dL (ref 11.7–15.5)
MCH: 27.1 pg (ref 27.0–33.0)
MCHC: 31.6 g/dL — ABNORMAL LOW (ref 32.0–36.0)
MCV: 85.8 fL (ref 80.0–100.0)
MPV: 10.1 fL (ref 7.5–12.5)
Platelets: 241 10*3/uL (ref 140–400)
RBC: 5.13 10*6/uL — ABNORMAL HIGH (ref 3.80–5.10)
RDW: 13.9 % (ref 11.0–15.0)
WBC: 6.9 10*3/uL (ref 3.8–10.8)

## 2019-07-05 LAB — TESTOS,TOTAL,FREE AND SHBG (FEMALE)
Free Testosterone: 3.4 pg/mL (ref 0.1–6.4)
Sex Hormone Binding: 50 nmol/L (ref 17–124)
Testosterone, Total, LC-MS-MS: 40 ng/dL (ref 2–45)

## 2019-07-05 LAB — LIPID PANEL
Cholesterol: 245 mg/dL — ABNORMAL HIGH (ref <200)
HDL: 56 mg/dL (ref >=50)
LDL Cholesterol (Calc): 160 mg/dL (calc) — ABNORMAL HIGH
Non-HDL Cholesterol (Calc): 189 mg/dL (calc) — ABNORMAL HIGH (ref <130)
Total CHOL/HDL Ratio: 4.4 (calc) (ref <5.0)
Triglycerides: 159 mg/dL — ABNORMAL HIGH (ref <150)

## 2019-07-05 LAB — HEMOGLOBIN A1C
Hgb A1c MFr Bld: 5.1 % of total Hgb (ref <5.7)
Mean Plasma Glucose: 100 (calc)
eAG (mmol/L): 5.5 (calc)

## 2019-07-05 LAB — TSH: TSH: 6.12 mIU/L — ABNORMAL HIGH

## 2019-07-05 LAB — COMPLETE METABOLIC PANEL WITH GFR
AG Ratio: 1.3 (calc) (ref 1.0–2.5)
ALT: 17 U/L (ref 6–29)
AST: 24 U/L (ref 10–35)
Albumin: 4.3 g/dL (ref 3.6–5.1)
Alkaline phosphatase (APISO): 127 U/L (ref 37–153)
BUN: 12 mg/dL (ref 7–25)
CO2: 25 mmol/L (ref 20–32)
Calcium: 10.2 mg/dL (ref 8.6–10.4)
Chloride: 105 mmol/L (ref 98–110)
Creat: 0.84 mg/dL (ref 0.50–1.05)
GFR, Est African American: 92 mL/min/{1.73_m2} (ref >=60)
GFR, Est Non African American: 79 mL/min/{1.73_m2} (ref >=60)
Globulin: 3.2 g/dL (calc) (ref 1.9–3.7)
Glucose, Bld: 92 mg/dL (ref 65–99)
Potassium: 4.2 mmol/L (ref 3.5–5.3)
Sodium: 142 mmol/L (ref 135–146)
Total Bilirubin: 0.3 mg/dL (ref 0.2–1.2)
Total Protein: 7.5 g/dL (ref 6.1–8.1)

## 2019-07-05 LAB — VITAMIN D 25 HYDROXY (VIT D DEFICIENCY, FRACTURES): Vit D, 25-Hydroxy: 18 ng/mL — ABNORMAL LOW (ref 30–100)

## 2019-07-05 LAB — T3, FREE: T3, Free: 3.7 pg/mL (ref 2.3–4.2)

## 2019-07-05 NOTE — Progress Notes (Signed)
   Covid-19 Vaccination Clinic  Name:  Bethany Wang    MRN: [id number] DOB: 12-15-1965  07/05/2019  Bethany Wang was observed post Covid-19 immunization for 30 minutes based on pre-vaccination screening without incident. She was provided with Vaccine Information Sheet and instruction to access the V-Safe system.   Bethany Wang was instructed to call 911 with any severe reactions post vaccine: [name] Difficulty breathing  . Swelling of face and throat  . A fast heartbeat  . A bad rash all over body  . Dizziness and weakness   Immunizations Administered    Name Date Dose VIS Date Route   Moderna COVID-19 Vaccine 07/05/2019 10:54 AM 0.5 mL 03/13/2019 Intramuscular   Manufacturer: Moderna   Lot: [id number]   [id number]

## 2019-07-09 ENCOUNTER — Other Ambulatory Visit (INDEPENDENT_AMBULATORY_CARE_PROVIDER_SITE_OTHER): Payer: Self-pay | Admitting: Internal Medicine

## 2019-07-09 MED ORDER — THYROID 120 MG PO TABS: 120.0000 mg | ORAL_TABLET | Freq: Every day | ORAL | 3 refills | Status: DC

## 2019-07-09 NOTE — Progress Notes (Signed)
Patient called.  Given the message on thyroid medication increase to 120 mg tablet daily. To start when she pick up from [facility].

## 2019-07-10 ENCOUNTER — Other Ambulatory Visit: Payer: Self-pay

## 2019-07-10 ENCOUNTER — Ambulatory Visit (INDEPENDENT_AMBULATORY_CARE_PROVIDER_SITE_OTHER): Payer: Medicare Other

## 2019-07-10 DIAGNOSIS — R6882 Decreased libido: Secondary | ICD-10-CM

## 2019-07-10 MED ORDER — NONFORMULARY OR COMPOUNDED ITEM
0.2000 mL | Status: DC
  Administered 2019-07-17 – 2019-10-23 (×13): 0.2 mL via INTRAMUSCULAR

## 2019-07-10 NOTE — Progress Notes (Signed)
Pt given 0.74ml to the right thigh. Pt tolerated well.

## 2019-07-11 ENCOUNTER — Ambulatory Visit (INDEPENDENT_AMBULATORY_CARE_PROVIDER_SITE_OTHER): Payer: Medicare Other | Admitting: Cardiology

## 2019-07-11 ENCOUNTER — Encounter: Payer: Self-pay | Admitting: Cardiology

## 2019-07-11 VITALS — BP 162/98 | HR 84 | Temp 97.2°F | Ht 64.0 in | Wt 164.0 lb

## 2019-07-11 DIAGNOSIS — I25119 Atherosclerotic heart disease of native coronary artery with unspecified angina pectoris: Secondary | ICD-10-CM | POA: Diagnosis not present

## 2019-07-11 DIAGNOSIS — E782 Mixed hyperlipidemia: Secondary | ICD-10-CM | POA: Diagnosis not present

## 2019-07-11 MED ORDER — NITROGLYCERIN 0.4 MG SL SUBL: 0.4000 mg | SUBLINGUAL_TABLET | SUBLINGUAL | 3 refills | Status: DC | PRN

## 2019-07-11 NOTE — Patient Instructions (Signed)
Medication Instructions:  Your physician recommends that you continue on your current medications as directed. Please refer to the Current Medication list given to you today.  *If you need a refill on your cardiac medications before your next appointment, please call your pharmacy*   Lab Work: None today If you have labs (blood work) drawn today and your tests are completely normal, you will receive your results only by: . MyChart Message (if you have MyChart) OR . A paper copy in the mail If you have any lab test that is abnormal or we need to change your treatment, we will call you to review the results.   Testing/Procedures: None today   Follow-Up: At [facility], you and your health needs are our priority.  As part of our continuing mission to provide you with exceptional heart care, we have created designated Provider Care Teams.  These Care Teams include your primary Cardiologist (physician) and Advanced Practice Providers (APPs -  Physician Assistants and Nurse Practitioners) who all work together to provide you with the care you need, when you need it.  We recommend signing up for the patient portal called "MyChart".  Sign up information is provided on this After Visit Summary.  MyChart is used to connect with patients for Virtual Visits (Telemedicine).  Patients are able to view lab/test results, encounter notes, upcoming appointments, etc.  Non-urgent messages can be sent to your provider as well.   To learn more about what you can do with MyChart, go to [email or web address].    Your next appointment:   6 month(s)  The format for your next appointment:   In Person  Provider:   Brittany Strader, PA-C   Other Instructions None      Thank you for choosing  [facility] !         

## 2019-07-11 NOTE — Progress Notes (Signed)
Cardiology Office Note  Date: 07/11/2019   ID: Bethany Wang, DOB 1965/06/21, MRN [id number]  PCP:  [name], MD  Cardiologist:  [name], MD Electrophysiologist:  None   Chief Complaint  Patient presents with  . Cardiac follow-up    History of Present Illness: Bethany Wang is a 54 y.o. female last assessed via telehealth encounter in November 2020 by Mr. Leonides Sake NP.  She presents for a routine visit.  She tells me that she is now living in an apartment complex in [place] with her husband.  She has been under a tremendous amount of psychosocial stress.  She states that she has had a number of deaths recently in her family.  She does talk to a therapist and continues to follow regularly with Dr. [name].  We went over her medications today which are stable from a cardiac perspective.  She states that she has had intermittent trouble with swallowing pills and food, reporting frank dysphagia and also emesis of gastric contents.  She plans to follow-up with GI.  Also reporting recurring headaches and plans to follow-up with neurology.  Her most recent cardiac catheterization from last year is outlined below.  We plan to continue medical therapy at this point.  Past Medical History:  Diagnosis Date  . Anemia   . Anxiety   . Bilateral breast cysts 12/30/2015  . Brain tumor ([place])   . Breast cancer ([place])    Left Breast Cancer  . BV (bacterial vaginosis) 09/09/2015  . Coronary atherosclerosis of native coronary artery    a. s/p DES to RCA in 2013 b. DES to LAD in 2014 c. cath in 04/2016 showing patent LAD stent with D2 jailed by LAD stent and CTO of RCA with left to right collaterals present  . Cyst of pharynx or nasopharynx    [name]  . Decreased libido 12/25/2018  . Depression   . Essential hypertension   . Falls    x 3-4 in past year  . GERD (gastroesophageal reflux disease)   . History of hematuria   . Mixed hyperlipidemia   . Personal  history of chemotherapy    Left Breast Cancer  . Personal history of radiation therapy    Left Breast Cancer  . Pre-diabetes   . PUD (peptic ulcer disease)   . Seizures ([place])   . Shingles 09/09/2015  . Stroke ([place])    12-2017 on Plavix, only deficit is headaches  . Suicide attempt ([place])    3 attempts in remote past  . Uterine cancer ([place])   . Vitamin D deficiency disease 04/03/2019    Past Surgical History:  Procedure Laterality Date  . ABDOMINAL HYSTERECTOMY    . BLADDER SURGERY    . BREAST LUMPECTOMY Left   . BREAST LUMPECTOMY Right 02/24/2018   Procedure: RIGHT BREAST LUMPECTOMY ERAS PATHWAY;  Surgeon: Jovita Kussmaul, MD;  Location: [facility];  Service: General;  Laterality: Right;  . CARDIAC CATHETERIZATION N/A 05/10/2016   Procedure: Left Heart Cath and Coronary Angiography;  Surgeon: Belva Crome, MD;  Location: [facility];  Service: Cardiovascular;  Laterality: N/A;  . COLONOSCOPY  May 2010   [place]man: normal rectum, internal hemorrhoids, , benign colonic polyp  . COLONOSCOPY N/A 01/05/2017   Procedure: COLONOSCOPY;  Surgeon: Rogene Houston, MD;  [facility];  Service: Endoscopy;  Laterality: N/A;  1:00  . ESOPHAGOGASTRODUODENOSCOPY     2001 Dr. Amedeo Plenty: distal esophagitis, small hiatal  hernia,. Dr. Tamala Julian 2006? no records available currently, pt also reports  EGD a few years ago with Dr. Gala Romney, do not have these reports anywhere in medical records  . ESOPHAGOGASTRODUODENOSCOPY  06/02/2011   [id number] pill impaction as described above s/p dilation of a probable cervical esophageal web/bx abnormal esophageal and gastric mucosa. + H.pylori gastritis   . EYE SURGERY     Removed glass  . HAND SURGERY Right   . Left breast lumpectomy     Benign  . LEFT HEART CATH AND CORONARY ANGIOGRAPHY N/A 10/25/2018   Procedure: LEFT HEART CATH AND CORONARY ANGIOGRAPHY;  Surgeon: Wellington Hampshire, MD;  Location: [facility];  Service: Cardiovascular;  Laterality: N/A;    . LEFT HEART CATHETERIZATION WITH CORONARY ANGIOGRAM N/A 07/03/2014   Procedure: LEFT HEART CATHETERIZATION WITH CORONARY ANGIOGRAM;  Surgeon: Belva Crome, MD;  [facility];  Service: Cardiovascular;  Laterality: N/A;  . POLYPECTOMY  01/05/2017   Procedure: POLYPECTOMY;  Surgeon: Rogene Houston, MD;  [facility];  Service: Endoscopy;;  colon  . SHOULDER SURGERY Left     Current Outpatient Medications  Medication Sig Dispense Refill  . ALPRAZolam (XANAX) 0.5 MG tablet Take 1 tablet (0.5 mg total) by mouth every morning. 30 tablet 0  . amLODipine (NORVASC) 10 MG tablet Take 1 tablet (10 mg total) by mouth daily. 30 tablet 6  . aspirin EC 81 MG tablet Take 162 mg by mouth 2 (two) times daily.    [name] atorvastatin (LIPITOR) 40 MG tablet Take 1 tablet (40 mg total) by mouth daily at 6 PM. 30 tablet 0  . Cholecalciferol (VITAMIN D3) 25 MCG (1000 UT) CHEW Chew 2,000 Units by mouth daily.    . clopidogrel (PLAVIX) 75 MG tablet Take 75 mg by mouth daily.     [name] escitalopram (LEXAPRO) 20 MG tablet Take 20 mg by mouth daily.     . fluticasone (FLONASE) 50 MCG/ACT nasal spray Place 1 spray into both nostrils daily. 9.9 mL 0  . isosorbide mononitrate (IMDUR) 30 MG 24 hr tablet Take 3 tablets (90 mg total) by mouth daily.    [name] LINZESS 145 MCG CAPS capsule Take 145 mcg by mouth daily.     [name] losartan (COZAAR) 100 MG tablet Take 1 tablet (100 mg total) by mouth daily.    . Medical ID Bracelet MISC 1 Units by Does not apply route daily. 1 each o  . metoprolol tartrate (LOPRESSOR) 50 MG tablet Take 1.5 tablets (75 mg total) by mouth 2 (two) times daily. 90 tablet 6  . nitroGLYCERIN (NITROSTAT) 0.4 MG SL tablet Place 1 tablet (0.4 mg total) under the tongue every 5 (five) minutes as needed for chest pain. 25 tablet 3  . NONFORMULARY OR COMPOUNDED ITEM Inject 5 mg into the muscle every 7 (seven) days. Testosterone cypionate in olive  oil (25 mg/mL).  Dispense 5 mL vial. 1 each 3  . potassium  chloride 20 MEQ/15ML (10%) SOLN Take 15 mLs by mouth daily.    [name] thyroid (NP THYROID) 120 MG tablet Take 1 tablet (120 mg total) by mouth daily before breakfast. 30 tablet 3   Current Facility-Administered Medications  Medication Dose Route Frequency Provider Last Rate Last Admin  . NONFORMULARY OR COMPOUNDED ITEM 0.2 mL  0.2 mL Intramuscular Q7 days [name], Nimish C, MD       Allergies:  Bee venom, Penicillins, and Shellfish allergy   ROS:   Stress and anxiety, recurring  headaches and spells, dysphagia, intermittent nausea and emesis.  Physical Exam: VS:  BP (!) 162/98   Pulse 84   Temp (!) 97.2 F (36.2 C)   Ht 5\' 4"  (1.626 m)   Wt 164 lb (74.4 kg)   SpO2 96%   BMI 28.15 kg/m , BMI Body mass index is 28.15 kg/m.  Wt Readings from Last 3 Encounters:  07/11/19 164 lb (74.4 kg)  07/02/19 167 lb 6.4 oz (75.9 kg)  05/22/19 163 lb (73.9 kg)    General: Patient appears comfortable at rest. HEENT: Conjunctiva and lids normal, wearing a mask. Neck: Supple, no elevated JVP or carotid bruits, no thyromegaly. Lungs: Clear to auscultation, nonlabored breathing at rest. Cardiac: Regular rate and rhythm, no S3 or significant systolic murmur, no pericardial rub. Abdomen: Soft, nontender, bowel sounds present. Extremities: No pitting edema, distal pulses 2+.  ECG:  An ECG dated 05/08/2019 was personally reviewed today and demonstrated:  Sinus rhythm with increased voltage, decreased wave progression.  Recent Labwork: 07/02/2019: ALT 17; AST 24; BUN 12; Creat 0.84; Hemoglobin 13.9; Platelets 241; Potassium 4.2; Sodium 142; TSH 6.12     Component Value Date/Time   CHOL 245 (H) 07/02/2019 1156   TRIG 159 (H) 07/02/2019 1156   HDL 56 07/02/2019 1156   CHOLHDL 4.4 07/02/2019 1156   VLDL 14 10/26/2018 0415   LDLCALC 160 (H) 07/02/2019 1156    Other Studies Reviewed Today:  Cardiac catheterization 10/25/2018:  Dist LAD lesion is 50% stenosed.  Ost 2nd Diag lesion is 75%  stenosed.  1st Mrg lesion is 60% stenosed.  Prox Cx to Dist Cx lesion is 30% stenosed.  2nd Mrg lesion is 20% stenosed.  Prox RCA to Mid RCA lesion is 100% stenosed.  Mid LAD-1 lesion is 20% stenosed.  Mid LAD-2 lesion is 30% stenosed.  Prox LAD lesion is 30% stenosed.  2nd Diag lesion is 70% stenosed.   1.  Significant underlying two-vessel coronary artery disease with widely patent LAD stent with minimal restenosis.  Chronically occluded RCA stent with well-developed left-to-right collaterals.  Jailed second diagonal has a stable 70% ostial stenosis.  Moderate OM1 disease. 2.  Left ventricular angiography was not performed.  EF is normal by echo. 3.  Normal left ventricular end-diastolic pressure at 11 mmHg.  Recommendations: I do not see a culprit for the patient's presumed unstable angina.  Her coronary anatomy is unchanged from 2018 and if anything, the disease in second diagonal and OM1 appear improved in appearance. I recommend continuing aggressive medical therapy.  Assessment and Plan:  1.  Multivessel CAD status post DES to the RCA in 2013, DES to the LAD in 2014, and stable two-vessel disease by cardiac catheterization done in follow-up back in July 2020.  RCA is known to be occluded at prior stent site with well-developed left-to-right collaterals.  She is being managed medically with plan to continue aspirin, Plavix, Norvasc, Lipitor, Imdur, losartan, Lopressor, and as needed nitroglycerin.  2.  Reported dysphagia with intermittent nausea and emesis.  Reports this with medications and solids and plans to follow-up with GI.   Medication Adjustments/Labs and Tests Ordered: Current medicines are reviewed at length with the patient today.  Concerns regarding medicines are outlined above.   Tests Ordered: No orders of the defined types were placed in this encounter.   Medication Changes: Meds ordered this encounter  Medications  . nitroGLYCERIN (NITROSTAT) 0.4 MG  SL tablet    Sig: Place 1 tablet (0.4 mg total) under the  tongue every 5 (five) minutes as needed for chest pain.    Dispense:  25 tablet    Refill:  3    Disposition:  Follow up 6 months in the [place] office with [place].  Signed, Satira Sark, MD, [facility] 07/11/2019 11:58 AM    [place] [facility] [address], [place], [place] [address] Phone: [phone]; Fax: [phone]

## 2019-07-16 ENCOUNTER — Other Ambulatory Visit: Payer: Self-pay

## 2019-07-16 ENCOUNTER — Ambulatory Visit (INDEPENDENT_AMBULATORY_CARE_PROVIDER_SITE_OTHER): Payer: Medicare Other | Admitting: Diagnostic Neuroimaging

## 2019-07-16 ENCOUNTER — Encounter: Payer: Self-pay | Admitting: Diagnostic Neuroimaging

## 2019-07-16 VITALS — BP 176/96 | HR 68 | Temp 98.1°F | Ht 64.0 in | Wt 165.6 lb

## 2019-07-16 DIAGNOSIS — G43101 Migraine with aura, not intractable, with status migrainosus: Secondary | ICD-10-CM

## 2019-07-16 DIAGNOSIS — G44209 Tension-type headache, unspecified, not intractable: Secondary | ICD-10-CM | POA: Diagnosis not present

## 2019-07-16 NOTE — Progress Notes (Signed)
[facility]  PATIENT: Bethany Wang DOB: 1966/02/14  REFERRING CLINICIAN: Manuella Ghazi HISTORY FROM: patient REASON FOR VISIT: follow up   HISTORICAL  CHIEF COMPLAINT:  Chief Complaint  Patient presents with  . Headaches w/visual changes    rm 7 New pt "having bad headaches that cause me to pass out, can only take Tylenol or Ibuprofen"    HISTORY OF PRESENT ILLNESS:   UPDATE (07/16/19, VRP): Since last visit, doing poorly; high stress at home since July 2020. More HA (daily). 4 syncope events at home since July 2020; but did not go to ER. Poor PO intake (eats 1x per day; sometimes does not eat for 2-3 days). Also has poor sleep (stay up all night worrying and praying). No alleviating factors.    UPDATE 11/10/15: Since last visit, had another syncope event at church 2 weeks ago, while sitting down, no warning. Collapsed, hit head, LOC, no seizure activity noted. Taken to [facility] by 911, spent 1 night in ICU and then d/c home. Still eating 1 time per day (minimal food). Has poor appetite due to severe nausea with eating. Not seen GI in a long time. Still eating red dirt and ice.   UPDATE 08/08/15: Since last visit, no syncope events. Continues with headaches (3-4 per week). Also reports significant pica symptoms (ice and red dirt) and has history of iron deficiency anemia. Has tried iron tabs but could not tolerate.   UPDATE 06/25/15: Since last visit, continues to have episodes of syncope (4 since 2015). First feels sweaty, then LOC. Then wakes up with nausea, headache, pounding, left eye pain, ringing in ears, photophobia, then left arm numbness.  UPDATE 02/13/14: Since last visit, patient here for multiple syncope events. Has had 3 events in the last 2 months. Symptoms start with neck pain, then feeling sweaty, hot sensation, sounds feel like they are in an echo chamber, and then patient collapsed to the ground. No convulsions. Patient is unconscious for 5-10 minutes at  a time. She then is able to wake up, with energy drained feeling, sweaty hot feeling. No convulsions. No tongue biting or incontinence. Patient has had some double vision with some of these events.  PRIOR HPI (01/21/12): 54 year old right-handed female with history of hypertension, diabetes, here for evaluation of migraine. Patient reports history of intermittent severe headaches since childhood. Headaches typically start with nausea and vomiting, sensitivity sound, followed by severe frontal headaches. She's been on Friday medications over the years. Nowadays she has to severe migraine days per month. She's tried Topamax but this caused a rash. She's been on amitriptyline since that time to help with migraine control. She also has significant stress in her life with anger issues, and is on Cymbalta, venlafaxine and Depakote. With some of these severe headaches, patient has had episodes where she passes out, falls to the ground and shakes. To the emergency room for evaluation the past. She's also seen by another neurologist, who did EEG, and diagnosed her with nonepileptic spells.   REVIEW OF SYSTEMS: Full 14 system review of systems performed and negative except for: as per HPI.    ALLERGIES: Allergies  Allergen Reactions  . Bee Venom Anaphylaxis and Other (See Comments)    "throat swelled and had to the hospital" - Yellow jacket sting  . Penicillins Anaphylaxis and Other (See Comments)    Has patient had a PCN reaction causing immediate rash, facial/tongue/throat swelling, SOB or lightheadedness with hypotension: Yes Has patient had a PCN reaction causing  severe rash involving mucus membranes or skin necrosis: Yes Has patient had a PCN reaction that required hospitalization Yes Has patient had a PCN reaction occurring within the last 10 years: Yes If all of the above answers are "NO", then may proceed with Cephalosporin use. Patient states she carries an epi-pen  . Shellfish Allergy Swelling     HOME MEDICATIONS: Outpatient Medications Prior to Visit  Medication Sig Dispense Refill  . ALPRAZolam (XANAX) 0.5 MG tablet Take 1 tablet (0.5 mg total) by mouth every morning. 30 tablet 0  . amLODipine (NORVASC) 10 MG tablet Take 1 tablet (10 mg total) by mouth daily. 30 tablet 6  . aspirin EC 81 MG tablet Take 162 mg by mouth 2 (two) times daily.    [name] atorvastatin (LIPITOR) 40 MG tablet Take 1 tablet (40 mg total) by mouth daily at 6 PM. 30 tablet 0  . Cholecalciferol (VITAMIN D3) 25 MCG (1000 UT) CHEW Chew 2,000 Units by mouth daily.    . clopidogrel (PLAVIX) 75 MG tablet Take 75 mg by mouth daily.     [name] escitalopram (LEXAPRO) 20 MG tablet Take 20 mg by mouth daily.     . fluticasone (FLONASE) 50 MCG/ACT nasal spray Place 1 spray into both nostrils daily. 9.9 mL 0  . isosorbide mononitrate (IMDUR) 30 MG 24 hr tablet Take 3 tablets (90 mg total) by mouth daily.    [name] LINZESS 145 MCG CAPS capsule Take 145 mcg by mouth daily.     [name] losartan (COZAAR) 100 MG tablet Take 1 tablet (100 mg total) by mouth daily.    . Medical ID Bracelet MISC 1 Units by Does not apply route daily. 1 each o  . metoprolol tartrate (LOPRESSOR) 50 MG tablet Take 1.5 tablets (75 mg total) by mouth 2 (two) times daily. 90 tablet 6  . nitroGLYCERIN (NITROSTAT) 0.4 MG SL tablet Place 1 tablet (0.4 mg total) under the tongue every 5 (five) minutes as needed for chest pain. 25 tablet 3  . NONFORMULARY OR COMPOUNDED ITEM Inject 5 mg into the muscle every 7 (seven) days. Testosterone cypionate in olive  oil (25 mg/mL).  Dispense 5 mL vial. 1 each 3  . potassium chloride 20 MEQ/15ML (10%) SOLN Take 15 mLs by mouth daily.    [name] thyroid (NP THYROID) 120 MG tablet Take 1 tablet (120 mg total) by mouth daily before breakfast. 30 tablet 3   Facility-Administered Medications Prior to Visit  Medication Dose Route Frequency Provider Last Rate Last Admin  . NONFORMULARY OR COMPOUNDED ITEM 0.2 mL  0.2 mL Intramuscular Q7 days [name], MD        PAST MEDICAL HISTORY: Past Medical History:  Diagnosis Date  . Anemia   . Anxiety   . Bilateral breast cysts 12/30/2015  . Brain tumor ([place])   . Breast cancer ([place])    Left Breast Cancer  . BV (bacterial vaginosis) 09/09/2015  . Coronary atherosclerosis of native coronary artery    a. s/p DES to RCA in 2013 b. DES to LAD in 2014 c. cath in 04/2016 showing patent LAD stent with D2 jailed by LAD stent and CTO of RCA with left to right collaterals present  . Cyst of pharynx or nasopharynx    [name]  . Decreased libido 12/25/2018  . Depression   . Essential hypertension   . Falls    x 3-4 in past year  . GERD (gastroesophageal reflux disease)   . Headache   .  History of hematuria   . Mixed hyperlipidemia   . Personal history of chemotherapy    Left Breast Cancer  . Personal history of radiation therapy    Left Breast Cancer  . Pre-diabetes   . PUD (peptic ulcer disease)   . Seizures ([place])   . Shingles 09/09/2015  . Stroke ([place])    12-2017 on Plavix, only deficit is headaches  . Suicide attempt ([place])    3 attempts in remote past  . Uterine cancer ([place])   . Vitamin D deficiency disease 04/03/2019    PAST SURGICAL HISTORY: Past Surgical History:  Procedure Laterality Date  . ABDOMINAL HYSTERECTOMY    . BLADDER SURGERY    . BREAST LUMPECTOMY Left   . BREAST LUMPECTOMY Right 02/24/2018   Procedure: RIGHT BREAST LUMPECTOMY ERAS PATHWAY;  Surgeon: Jovita Kussmaul, MD;  Location: [facility];  Service: General;  Laterality: Right;  . CARDIAC CATHETERIZATION N/A 05/10/2016   Procedure: Left Heart Cath and Coronary Angiography;  Surgeon: Belva Crome, MD;  Location: [facility];  Service: Cardiovascular;  Laterality: N/A;  . COLONOSCOPY  May 2010   Fleishman: normal rectum, internal hemorrhoids, , benign colonic polyp  . COLONOSCOPY N/A 01/05/2017   Procedure: COLONOSCOPY;  Surgeon: Rogene Houston, MD;  [facility];  Service:  Endoscopy;  Laterality: N/A;  1:00  . ESOPHAGOGASTRODUODENOSCOPY     2001 Dr. Amedeo Plenty: distal esophagitis, small hiatal hernia,. Dr. Tamala Julian 2006? no records available currently, pt also reports  EGD a few years ago with Dr. Gala Romney, do not have these reports anywhere in medical records  . ESOPHAGOGASTRODUODENOSCOPY  06/02/2011   [id number] pill impaction as described above s/p dilation of a probable cervical esophageal web/bx abnormal esophageal and gastric mucosa. + H.pylori gastritis   . EYE SURGERY     Removed glass  . HAND SURGERY Right   . Left breast lumpectomy     Benign  . LEFT HEART CATH AND CORONARY ANGIOGRAPHY N/A 10/25/2018   Procedure: LEFT HEART CATH AND CORONARY ANGIOGRAPHY;  Surgeon: Wellington Hampshire, MD;  Location: [facility];  Service: Cardiovascular;  Laterality: N/A;  . LEFT HEART CATHETERIZATION WITH CORONARY ANGIOGRAM N/A 07/03/2014   Procedure: LEFT HEART CATHETERIZATION WITH CORONARY ANGIOGRAM;  Surgeon: Belva Crome, MD;  Location: [facility];  Service: Cardiovascular;  Laterality: N/A;  . POLYPECTOMY  01/05/2017   Procedure: POLYPECTOMY;  Surgeon: Rogene Houston, MD;  [facility];  Service: Endoscopy;;  colon  . SHOULDER SURGERY Left     FAMILY HISTORY: Family History  Problem Relation Age of Onset  . Colon cancer Paternal Grandfather   . Cancer Father   . Cancer Maternal Uncle   . Alzheimer's disease Paternal Aunt   . Cirrhosis Maternal Uncle   . Cancer Paternal Aunt        lung  . Aneurysm Mother        brain  . Heart disease Brother   . Heart attack Brother   . Mental illness Daughter   . ADD / ADHD Daughter   . Bipolar disorder Daughter   . Mental illness Son   . ADD / ADHD Son   . Bipolar disorder Son   . Mental illness Son   . ADD / ADHD Son   . Bipolar disorder Son     SOCIAL HISTORY:  Social History   Socioeconomic History  . Marital status: Married    Spouse name: Saralyn Pilar  .  Number of children: 3  . Years of  education: Assoc  . Highest education level: Not on file  Occupational History  . Occupation: Retired    Comment: disability  Tobacco Use  . Smoking status: Former Smoker    Packs/day: 0.20    Years: 32.00    Pack years: 6.40    Types: Cigarettes, Cigars    Start date: 07/05/1983    Quit date: 04/03/2012    Years since quitting: 7.2  . Smokeless tobacco: Never Used  Substance and Sexual Activity  . Alcohol use: No    Alcohol/week: 0.0 standard drinks  . Drug use: No  . Sexual activity: Yes    Birth control/protection: Surgical    Comment: hyst  Other Topics Concern  . Not on file  Social History Narrative   Patient lives at home with her husband.On disability since age 72 yrs.Currently living in a hotel,looking for permanent residence.   Social Determinants of Health   Financial Resource Strain:   . Difficulty of Paying Living Expenses:   Food Insecurity:   . Worried About Charity fundraiser in the Last Year:   . Arboriculturist in the Last Year:   Transportation Needs:   . Film/video editor (Medical):   [name] Lack of Transportation (Non-Medical):   Physical Activity:   . Days of Exercise per Week:   . Minutes of Exercise per Session:   Stress:   . Feeling of Stress :   Social Connections:   . Frequency of Communication with Friends and Family:   . Frequency of Social Gatherings with Friends and Family:   . Attends Religious Services:   . Active Member of Clubs or Organizations:   . Attends Archivist Meetings:   [name] Marital Status:   Intimate Partner Violence:   . Fear of Current or Ex-Partner:   . Emotionally Abused:   [name] Physically Abused:   . Sexually Abused:      PHYSICAL EXAM  Vitals:   07/16/19 1542  BP: (!) 176/96  Pulse: 68  Temp: 98.1 F (36.7 C)  Weight: 165 lb 9.6 oz (75.1 kg)  Height: 5\' 4"  (1.626 m)    Not recorded     No exam data present  Body mass index is 28.43 kg/m.   GENERAL EXAM: Patient is in no distress; well  developed, nourished and groomed; neck is supple  CARDIOVASCULAR: Regular rate and rhythm, no murmurs, no carotid bruits  NEUROLOGIC: MENTAL STATUS: awake, alert, language fluent, comprehension intact, naming intact, fund of knowledge appropriate CRANIAL NERVE: no papilledema on fundoscopic exam, pupils equal and reactive to light, visual fields full to confrontation, extraocular muscles intact, no nystagmus, facial sensation and strength symmetric, hearing intact, palate elevates symmetrically, uvula midline, shoulder shrug symmetric, tongue midline. MOTOR: normal bulk and tone, full strength in the BUE, BLE SENSORY: normal and symmetric to light touch and vibration and temperature COORDINATION: finger-nose-finger, fine finger movements normal REFLEXES: deep tendon reflexes present and symmetric GAIT/STATION: LEFT HIP PAIN; LIMPING     DIAGNOSTIC DATA (LABS, IMAGING, TESTING) - I reviewed patient records, labs, notes, testing and imaging myself where available.  Lab Results  Component Value Date   WBC 6.9 07/02/2019   HGB 13.9 07/02/2019   HCT 44.0 07/02/2019   MCV 85.8 07/02/2019   PLT 241 07/02/2019      Component Value Date/Time   NA 142 07/02/2019 1156   NA 143 11/10/2015 0914   K 4.2 07/02/2019 1156  CL 105 07/02/2019 1156   CO2 25 07/02/2019 1156   GLUCOSE 92 07/02/2019 1156   BUN 12 07/02/2019 1156   BUN 9 11/10/2015 0914   CREATININE 0.84 07/02/2019 1156   CALCIUM 10.2 07/02/2019 1156   PROT 7.5 07/02/2019 1156   PROT 7.0 11/10/2015 0914   ALBUMIN 4.2 03/13/2019 2022   ALBUMIN 4.4 11/10/2015 0914   AST 24 07/02/2019 1156   ALT 17 07/02/2019 1156   ALKPHOS 113 03/13/2019 2022   BILITOT 0.3 07/02/2019 1156   BILITOT 0.2 11/10/2015 0914   GFRNONAA 79 07/02/2019 1156   GFRAA 92 07/02/2019 1156   Lab Results  Component Value Date   CHOL 245 (H) 07/02/2019   HDL 56 07/02/2019   LDLCALC 160 (H) 07/02/2019   TRIG 159 (H) 07/02/2019   CHOLHDL 4.4 07/02/2019     Lab Results  Component Value Date   HGBA1C 5.1 07/02/2019   Lab Results  Component Value Date   VITAMINB12 680 11/10/2015   Lab Results  Component Value Date   TSH 6.12 (H) 07/02/2019    3/06/11/15 EEG - normal  07/16/15 MRI brain (without) [I reviewed images myself and agree with interpretation. -VRP]  - No acute findings. No change from MRI on 03/11/14.  07/16/15 MRA head [I reviewed images myself and agree with interpretation. -VRP]  - normal   07/16/15 MRA neck (with and without) [I reviewed images myself and agree with interpretation. -VRP]  1. The right vertebral artery is slightly irregular proximally with 30-40% focal stenosis; however there is robust flow signal superiorly up to the vertebrobasilar junction.  2. The left internal carotid artery at the carotid bulb has smooth plaque without focal stenosis.  3. No significant change from MRA neck on 03/11/14.   11/24/15 MRI brain - Unremarkable MRI brain (without). No acute findings. No change from MRI on 07/16/15.      ASSESSMENT AND PLAN  54 y.o. year old female here with history of migraine, non-epileptic spells, now with new onset of multiple syncope events in last 2 months. These new events do not sound like seizure. Could be cardiogenic syncope or vertebrobasilar TIA. MRI brain and MRA head unremarkable. She does have some atherosclerosis in the neck but I do not this she is symptomatic from these, and I would recommend medical management of risk factors. Now with at least 4 more syncope events since 2015.    Dx:  No diagnosis found.    PLAN:  MIGRAINE HEADACHES / TENSION HEADACHES - continue betablocker - intolerant of topiramate - cannot take triptans due to CAD - consider CGRP antagonists (aimovig, emgality, ajovy); patient wants to hold off on addl meds - high levels of stress are main contributing factor; continue counselor  RECURRENT SYNCOPE ATTACKS (? poor nutrition/PO intake, sleep deprivation) -  continue aspirin - continue BP control - no driving until event free x 6 months - follow up with PCP and cardiology  RECURRENT NAUSEA / VOMITING - follow up with PCP and GI  PICA - follow up with PCP  Return for pending if symptoms worsen or fail to improve, return to PCP.     Penni Bombard, MD [id number], [id number] PM Certified in Neurology, Neurophysiology and Neuroimaging  [facility] [address], [place], [place] [address] [phone]

## 2019-07-17 ENCOUNTER — Ambulatory Visit (INDEPENDENT_AMBULATORY_CARE_PROVIDER_SITE_OTHER): Payer: Medicare Other

## 2019-07-17 ENCOUNTER — Other Ambulatory Visit: Payer: Self-pay

## 2019-07-17 VITALS — BP 132/77 | HR 89 | Temp 97.1°F | Ht 64.0 in | Wt 163.2 lb

## 2019-07-17 DIAGNOSIS — R6882 Decreased libido: Secondary | ICD-10-CM

## 2019-07-17 NOTE — Progress Notes (Signed)
Pt given 0.14mL to the right thigh. Pt tolerate well. No complaint.

## 2019-07-24 ENCOUNTER — Other Ambulatory Visit: Payer: Self-pay

## 2019-07-24 ENCOUNTER — Ambulatory Visit (INDEPENDENT_AMBULATORY_CARE_PROVIDER_SITE_OTHER): Payer: Medicare Other

## 2019-07-24 VITALS — Resp 18 | Ht 64.0 in | Wt 163.0 lb

## 2019-07-24 DIAGNOSIS — R6882 Decreased libido: Secondary | ICD-10-CM

## 2019-07-24 NOTE — Progress Notes (Signed)
Pt given 0.59ml of testosterone  injection to left thigh. Pt tolerated well. No complaints.

## 2019-07-26 ENCOUNTER — Telehealth (INDEPENDENT_AMBULATORY_CARE_PROVIDER_SITE_OTHER): Payer: Self-pay

## 2019-07-31 ENCOUNTER — Ambulatory Visit (INDEPENDENT_AMBULATORY_CARE_PROVIDER_SITE_OTHER): Payer: Medicare Other

## 2019-08-01 NOTE — Telephone Encounter (Signed)
Pt picked up bottles Monday morning.

## 2019-08-03 ENCOUNTER — Other Ambulatory Visit: Payer: Self-pay

## 2019-08-03 ENCOUNTER — Encounter (HOSPITAL_COMMUNITY): Payer: Self-pay | Admitting: *Deleted

## 2019-08-03 ENCOUNTER — Emergency Department (HOSPITAL_COMMUNITY): Payer: Medicare Other

## 2019-08-03 ENCOUNTER — Emergency Department (HOSPITAL_COMMUNITY)
Admission: EM | Admit: 2019-08-03 | Discharge: 2019-08-03 | Disposition: A | Discharge status: Home / Self Care | Payer: Medicare Other | Attending: Emergency Medicine | Admitting: Emergency Medicine

## 2019-08-03 DIAGNOSIS — F419 Anxiety disorder, unspecified: Secondary | ICD-10-CM | POA: Diagnosis not present

## 2019-08-03 DIAGNOSIS — I2510 Atherosclerotic heart disease of native coronary artery without angina pectoris: Secondary | ICD-10-CM | POA: Insufficient documentation

## 2019-08-03 DIAGNOSIS — Z79899 Other long term (current) drug therapy: Secondary | ICD-10-CM | POA: Diagnosis not present

## 2019-08-03 DIAGNOSIS — F439 Reaction to severe stress, unspecified: Secondary | ICD-10-CM | POA: Diagnosis not present

## 2019-08-03 DIAGNOSIS — Z9221 Personal history of antineoplastic chemotherapy: Secondary | ICD-10-CM | POA: Insufficient documentation

## 2019-08-03 DIAGNOSIS — R519 Headache, unspecified: Secondary | ICD-10-CM | POA: Insufficient documentation

## 2019-08-03 DIAGNOSIS — I10 Essential (primary) hypertension: Secondary | ICD-10-CM | POA: Diagnosis not present

## 2019-08-03 DIAGNOSIS — Z923 Personal history of irradiation: Secondary | ICD-10-CM | POA: Diagnosis not present

## 2019-08-03 DIAGNOSIS — Z8673 Personal history of transient ischemic attack (TIA), and cerebral infarction without residual deficits: Secondary | ICD-10-CM | POA: Insufficient documentation

## 2019-08-03 DIAGNOSIS — Z87891 Personal history of nicotine dependence: Secondary | ICD-10-CM | POA: Insufficient documentation

## 2019-08-03 DIAGNOSIS — R296 Repeated falls: Secondary | ICD-10-CM | POA: Diagnosis not present

## 2019-08-03 DIAGNOSIS — Z7982 Long term (current) use of aspirin: Secondary | ICD-10-CM | POA: Diagnosis not present

## 2019-08-03 DIAGNOSIS — R079 Chest pain, unspecified: Secondary | ICD-10-CM | POA: Diagnosis not present

## 2019-08-03 DIAGNOSIS — Z853 Personal history of malignant neoplasm of breast: Secondary | ICD-10-CM | POA: Insufficient documentation

## 2019-08-03 DIAGNOSIS — R200 Anesthesia of skin: Secondary | ICD-10-CM | POA: Diagnosis present

## 2019-08-03 DIAGNOSIS — Z8542 Personal history of malignant neoplasm of other parts of uterus: Secondary | ICD-10-CM | POA: Diagnosis not present

## 2019-08-03 LAB — TROPONIN I (HIGH SENSITIVITY)
Troponin I (High Sensitivity): 4 ng/L (ref <18)
Troponin I (High Sensitivity): 5 ng/L (ref <18)

## 2019-08-03 LAB — CBC WITH DIFFERENTIAL/PLATELET
Abs Immature Granulocytes: 0.01 10*3/uL (ref 0.00–0.07)
Basophils Absolute: 0 10*3/uL (ref 0.0–0.1)
Basophils Relative: 0 %
Eosinophils Absolute: 0.1 10*3/uL (ref 0.0–0.5)
Eosinophils Relative: 1 %
HCT: 41.2 % (ref 36.0–46.0)
Hemoglobin: 13.1 g/dL (ref 12.0–15.0)
Immature Granulocytes: 0 %
Lymphocytes Relative: 52 %
Lymphs Abs: 3.5 10*3/uL (ref 0.7–4.0)
MCH: 27.5 pg (ref 26.0–34.0)
MCHC: 31.8 g/dL (ref 30.0–36.0)
MCV: 86.4 fL (ref 80.0–100.0)
Monocytes Absolute: 0.6 10*3/uL (ref 0.1–1.0)
Monocytes Relative: 8 %
Neutro Abs: 2.7 10*3/uL (ref 1.7–7.7)
Neutrophils Relative %: 39 %
Platelets: 250 10*3/uL (ref 150–400)
RBC: 4.77 MIL/uL (ref 3.87–5.11)
RDW: 14.4 % (ref 11.5–15.5)
WBC: 6.8 10*3/uL (ref 4.0–10.5)
nRBC: 0 % (ref 0.0–0.2)

## 2019-08-03 LAB — COMPREHENSIVE METABOLIC PANEL
ALT: 14 U/L (ref 0–44)
AST: 21 U/L (ref 15–41)
Albumin: 4.1 g/dL (ref 3.5–5.0)
Alkaline Phosphatase: 123 U/L (ref 38–126)
Anion gap: 11 (ref 5–15)
BUN: 9 mg/dL (ref 6–20)
CO2: 24 mmol/L (ref 22–32)
Calcium: 9.3 mg/dL (ref 8.9–10.3)
Chloride: 105 mmol/L (ref 98–111)
Creatinine, Ser: 0.8 mg/dL (ref 0.44–1.00)
GFR calc Af Amer: >60 mL/min (ref >60)
GFR calc non Af Amer: >60 mL/min (ref >60)
Glucose, Bld: 99 mg/dL (ref 70–99)
Potassium: 3.8 mmol/L (ref 3.5–5.1)
Sodium: 140 mmol/L (ref 135–145)
Total Bilirubin: 0.4 mg/dL (ref 0.3–1.2)
Total Protein: 7.7 g/dL (ref 6.5–8.1)

## 2019-08-03 LAB — CBG MONITORING, ED: Glucose-Capillary: 107 mg/dL — ABNORMAL HIGH (ref 70–99)

## 2019-08-03 MED ORDER — HYDRALAZINE HCL 20 MG/ML IJ SOLN
10.0000 mg | Freq: Once | INTRAMUSCULAR | Status: AC
  Administered 2019-08-03: 10 mg via INTRAVENOUS
  Filled 2019-08-03: qty 1

## 2019-08-03 MED ORDER — LORAZEPAM 2 MG/ML IJ SOLN
1.0000 mg | Freq: Once | INTRAMUSCULAR | Status: AC
  Administered 2019-08-03: 1 mg via INTRAVENOUS
  Filled 2019-08-03: qty 1

## 2019-08-03 MED ORDER — METOCLOPRAMIDE HCL 10 MG PO TABS
10.0000 mg | ORAL_TABLET | Freq: Three times a day (TID) | ORAL | 0 refills | Status: DC | PRN
Start: 2019-08-03 — End: 2019-09-11

## 2019-08-03 MED ORDER — HYDROCODONE-ACETAMINOPHEN 5-325 MG PO TABS
2.0000 | ORAL_TABLET | Freq: Once | ORAL | Status: AC
  Administered 2019-08-03: 23:00:00 2 via ORAL
  Filled 2019-08-03: qty 2

## 2019-08-03 NOTE — Discharge Instructions (Signed)
Your CT scan, chest x-ray and all of your blood work was normal, please take the medication called Reglan every 8 hours as needed for headache, this can be used with ibuprofen up to 600 mg 4 times a day.  Please seek medical exam for severe or worsening headache. Please read the attached instructions regarding headaches, if you develop increasing chest pain or shortness of breath back to the hospital immediately, otherwise follow-up with your doctor within 3 days.

## 2019-08-03 NOTE — ED Notes (Signed)
Pt in Rad  

## 2019-08-03 NOTE — ED Notes (Signed)
Pt reports a day long of interpersonal altercations with family members  She reports she wasupset and still  Upset  Her head is pounding

## 2019-08-03 NOTE — ED Triage Notes (Signed)
Patient presents to the ED with chest pain that began today at 12 pm, with left arm numbness since about 6pm today.  Patient tried to get in with her PCP today. Patient reports headache since 12pm.  Patient is on blood thinners for history of stroke.

## 2019-08-03 NOTE — ED Provider Notes (Signed)
[facility] EMERGENCY DEPARTMENT Provider Note   CSN: [id number] Arrival date & time: 08/03/19  1812     History Chief Complaint  Patient presents with  . Chest Pain    left arm numbness    Bethany Wang is a 54 y.o. female.  HPI   This patient is a 54 year old female, she has multiple medical problems including a history of breast cancer, history of brain tumor, history of coronary disease status post stenting history of hypertension and a history of suicide attempts in the past.  She presents to the hospital today with a complaint of numbness, headache, chest pain and feeling like her legs have been wobbly with multiple falls at home.  It is difficult to elucidate which of these occurred first, the patient is somewhat scattered with her history and in fact I was called to the room when the patient collapsed to the floor as she was ambulating from triage back to her patient care room.  When I entered the room the patient was on the bed, her eyelids were fluttering, she was very weak and mumbling, ultimately after a couple of minutes the patient was very clear in her speech and answering questions appropriately.  She states that this morning she has had increasing numbness in her arm which seems to fluctuate over time ever since having a stroke, she has had numbness in her bilateral legs which is chronic after having back surgery gone awry according to the patient's report.  She states that she was having some wobbly knees and falling this morning and endorses having severe anxiety and stress related to family members were currently at home including her daughter and her 3 children who are homeless, not only is her daughter causing drama but her grandson who was at home is "acting just like his mama".  The patient does not go into any further details however she states that she did have a severe headache that occurred approximately 6 hours ago, it was acute in onset and similar to prior  headaches however it is not going away, it is causing some changes in her vision, although at this time her vision seems to be normal according to her report.  The patient also endorses having chronic diarrhea and abdominal upset and is scheduled to see gastroenterology.  Past Medical History:  Diagnosis Date  . Anemia   . Anxiety   . Bilateral breast cysts 12/30/2015  . Brain tumor ([place])   . Breast cancer ([place])    Left Breast Cancer  . BV (bacterial vaginosis) 09/09/2015  . Coronary atherosclerosis of native coronary artery    a. s/p DES to RCA in 2013 b. DES to LAD in 2014 c. cath in 04/2016 showing patent LAD stent with D2 jailed by LAD stent and CTO of RCA with left to right collaterals present  . Cyst of pharynx or nasopharynx    [name]  . Decreased libido 12/25/2018  . Depression   . Essential hypertension   . Falls    x 3-4 in past year  . GERD (gastroesophageal reflux disease)   . Headache   . History of hematuria   . Mixed hyperlipidemia   . Personal history of chemotherapy    Left Breast Cancer  . Personal history of radiation therapy    Left Breast Cancer  . Pre-diabetes   . PUD (peptic ulcer disease)   . Seizures ([place])   . Shingles 09/09/2015  . Stroke [facility])  12-2017 on Plavix, only deficit is headaches  . Suicide attempt ([place])    3 attempts in remote past  . Uterine cancer ([place])   . Vitamin D deficiency disease 04/03/2019    Patient Active Problem List   Diagnosis Date Noted  . Vitamin D deficiency disease 04/03/2019  . Decreased libido 12/25/2018  . Angina pectoris ([place]) 10/25/2018  . Palpitations 06/06/2018  . Chest pain 07/13/2017  . Nonspecific chest pain 07/13/2017  . Mixed hyperlipidemia 07/13/2017  . Precordial chest pain   . Hypertensive urgency 06/15/2016  . Unstable angina pectoris ([place]) 06/15/2016  . Unstable angina ([place]) 06/15/2016  . Bilateral breast cysts 12/30/2015  . Vaginal discharge 09/09/2015  . BV (bacterial  vaginosis) 09/09/2015  . Shingles 09/09/2015  . History of breast cancer 09/09/2015  . Syncope and collapse 08/08/2015  . Migraine with aura and with status migrainosus, not intractable 08/08/2015  . Chronic low back pain 08/08/2015  . Insomnia 08/08/2015  . Anxiety state 08/08/2015  . Panic attacks 08/08/2015  . Anemia, iron deficiency 08/08/2015  . Pica 08/08/2015  . Angina decubitus ([place]) 07/03/2014  . 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 Encounter for gynecological examination with Papanicolaou smear of cervix 02/04/2014  . Syncope 12/14/2013  . Cervical disc disorder with radiculopathy of cervical region 02/19/2013  . H/O rotator cuff surgery 02/19/2013  . Dyspareunia 02/01/2013  . Left shoulder pain 01/24/2013  . Rotator cuff tear 01/24/2013  . Radicular pain 01/24/2013  . Labral tear of shoulder 01/24/2013  . Complex regional pain syndrome of upper extremity 01/24/2013  . Herniated disc, cervical 01/24/2013  . Nausea and vomiting 01/18/2013  . Rectal bleeding 01/18/2013  . Edema of left foot 10/16/2012  . Coronary atherosclerosis of native coronary artery   . Essential hypertension, benign   . HLD (hyperlipidemia)   . Tobacco abuse     Past Surgical History:  Procedure Laterality Date  . ABDOMINAL HYSTERECTOMY    . BLADDER SURGERY    . BREAST LUMPECTOMY Left   . BREAST LUMPECTOMY Right 02/24/2018   Procedure: RIGHT BREAST LUMPECTOMY ERAS PATHWAY;  Surgeon: Jovita Kussmaul, MD;  Location: [facility];  Service: General;  Laterality: Right;  . CARDIAC CATHETERIZATION N/A 05/10/2016   Procedure: Left Heart Cath and Coronary Angiography;  Surgeon: Belva Crome, MD;  Location: [place] [facility];  Service: Cardiovascular;  Laterality: N/A;  . COLONOSCOPY  May 2010   Fleishman: normal rectum, internal hemorrhoids, , benign colonic polyp  . COLONOSCOPY N/A 01/05/2017   Procedure: COLONOSCOPY;  Surgeon: Rogene Houston, MD;  [facility];  Service: Endoscopy;   Laterality: N/A;  1:00  . ESOPHAGOGASTRODUODENOSCOPY     2001 Dr. Amedeo Plenty: distal esophagitis, small hiatal hernia,. Dr. Tamala Julian 2006? no records available currently, pt also reports  EGD a few years ago with Dr. Gala Romney, do not have these reports anywhere in medical records  . ESOPHAGOGASTRODUODENOSCOPY  06/02/2011   [id number] pill impaction as described above s/p dilation of a probable cervical esophageal web/bx abnormal esophageal and gastric mucosa. + H.pylori gastritis   . EYE SURGERY     Removed glass  . HAND SURGERY Right   . Left breast lumpectomy     Benign  . LEFT HEART CATH AND CORONARY ANGIOGRAPHY N/A 10/25/2018   Procedure: LEFT HEART CATH AND CORONARY ANGIOGRAPHY;  Surgeon: Wellington Hampshire, MD;  Location: [place] [facility];  Service: Cardiovascular;  Laterality: N/A;  . LEFT HEART CATHETERIZATION WITH CORONARY ANGIOGRAM N/A  07/03/2014   Procedure: LEFT HEART CATHETERIZATION WITH CORONARY ANGIOGRAM;  Surgeon: Belva Crome, MD;  Location: [facility] CATH LAB;  Service: Cardiovascular;  Laterality: N/A;  . POLYPECTOMY  01/05/2017   Procedure: POLYPECTOMY;  Surgeon: Rogene Houston, MD;  [facility];  Service: Endoscopy;;  colon  . SHOULDER SURGERY Left      OB History    Gravida  4   Para  3   Term      Preterm      AB  1   Living  3     SAB  1   TAB      Ectopic      Multiple      Live Births              Family History  Problem Relation Age of Onset  . Colon cancer Paternal Grandfather   . Cancer Father   . Cancer Maternal Uncle   . Alzheimer's disease Paternal Aunt   . Cirrhosis Maternal Uncle   . Cancer Paternal Aunt        lung  . Aneurysm Mother        brain  . Heart disease Brother   . Heart attack Brother   . Mental illness Daughter   . ADD / ADHD Daughter   . Bipolar disorder Daughter   . Mental illness Son   . ADD / ADHD Son   . Bipolar disorder Son   . Mental illness Son   . ADD / ADHD Son   . Bipolar disorder Son      Social History   Tobacco Use  . Smoking status: Former Smoker    Packs/day: 0.20    Years: 32.00    Pack years: 6.40    Types: Cigarettes, Cigars    Start date: 07/05/1983    Quit date: 04/03/2012    Years since quitting: 7.3  . Smokeless tobacco: Never Used  Substance Use Topics  . Alcohol use: No    Alcohol/week: 0.0 standard drinks  . Drug use: No    Home Medications Prior to Admission medications   Medication Sig Start Date End Date Taking? Authorizing Provider  ALPRAZolam Duanne Moron) 0.5 MG tablet Take 1 tablet (0.5 mg total) by mouth every morning. 05/08/19  Yes Gosrani, Nimish C, MD  amLODipine (NORVASC) 10 MG tablet Take 1 tablet (10 mg total) by mouth daily. 11/23/18  Yes Barrett, Evelene Croon, PA-C  aspirin EC 81 MG tablet Take 162 mg by mouth 2 (two) times daily.   Yes [provider]  atorvastatin (LIPITOR) 40 MG tablet Take 1 tablet (40 mg total) by mouth daily at 6 PM. 06/16/16  Yes Eber Jones, MD  Cholecalciferol (VITAMIN D3) 25 MCG (1000 UT) CHEW Chew 2,000 Units by mouth daily.   Yes [provider]  clopidogrel (PLAVIX) 75 MG tablet Take 75 mg by mouth daily.  12/13/17  Yes [provider]  escitalopram (LEXAPRO) 20 MG tablet Take 20 mg by mouth daily.  11/03/15  Yes [provider]  fluticasone (FLONASE) 50 MCG/ACT nasal spray Place 1 spray into both nostrils daily. 03/13/19  Yes Kendrick, Caitlyn S, PA-C  isosorbide mononitrate (IMDUR) 30 MG 24 hr tablet Take 3 tablets (90 mg total) by mouth daily. 10/27/18  Yes Cheryln Manly, NP  LINZESS 145 MCG CAPS capsule Take 145 mcg by mouth daily.  10/17/18  Yes [provider]  losartan (COZAAR) 100 MG tablet Take 1 tablet (  100 mg total) by mouth daily. 06/12/19  Yes Ailene Ards, NP  metoprolol tartrate (LOPRESSOR) 50 MG tablet Take 1.5 tablets (75 mg total) by mouth 2 (two) times daily. 05/07/19  Yes Satira Sark, MD  nitroGLYCERIN (NITROSTAT) 0.4 MG SL tablet Place 1  tablet (0.4 mg total) under the tongue every 5 (five) minutes as needed for chest pain. 07/11/19  Yes Satira Sark, MD  NONFORMULARY OR COMPOUNDED ITEM Inject 5 mg into the muscle every 7 (seven) days. Testosterone cypionate in olive  oil (25 mg/mL).  Dispense 5 mL vial. 06/11/19  Yes Gosrani, Nimish C, MD  potassium chloride 20 MEQ/15ML (10%) SOLN Take 15 mLs by mouth daily. 10/31/18  Yes [provider]  thyroid (NP THYROID) 120 MG tablet Take 1 tablet (120 mg total) by mouth daily before breakfast. 07/09/19  Yes [name], MD  Medical ID Bracelet MISC 1 Units by Does not apply route daily. 03/06/19   [name], MD  metoCLOPramide (REGLAN) 10 MG tablet Take 1 tablet (10 mg total) by mouth 3 (three) times daily as needed for nausea (headache / nausea). 08/03/19   Noemi Chapel, MD    Allergies    Bee venom, Penicillins, and Shellfish allergy  Review of Systems   Review of Systems  All other systems reviewed and are negative.   Physical Exam Updated Vital Signs BP 136/71   Pulse 71   Temp 98.5 F (36.9 C) (Oral)   Resp 20   Ht 1.626 m (5\' 4" )   Wt 71.2 kg   SpO2 98%   BMI 26.95 kg/m   Physical Exam Vitals and nursing note reviewed.  Constitutional:      General: She is in acute distress.     Appearance: She is well-developed.  HENT:     Head: Normocephalic and atraumatic.     Mouth/Throat:     Pharynx: No oropharyngeal exudate.  Eyes:     General: No scleral icterus.       Right eye: No discharge.        Left eye: No discharge.     Conjunctiva/sclera: Conjunctivae normal.     Pupils: Pupils are equal, round, and reactive to light.  Neck:     Thyroid: No thyromegaly.     Vascular: No JVD.  Cardiovascular:     Rate and Rhythm: Normal rate and regular rhythm.     Heart sounds: Normal heart sounds. No murmur. No friction rub. No gallop.   Pulmonary:     Effort: Pulmonary effort is normal. No respiratory distress.     Breath sounds: Normal  breath sounds. No wheezing or rales.  Abdominal:     General: Bowel sounds are normal. There is no distension.     Palpations: Abdomen is soft. There is no mass.     Tenderness: There is no abdominal tenderness.  Musculoskeletal:        General: No tenderness. Normal range of motion.     Cervical back: Normal range of motion and neck supple.  Lymphadenopathy:     Cervical: No cervical adenopathy.  Skin:    General: Skin is warm and dry.     Findings: No erythema or rash.  Neurological:     Mental Status: She is alert.     Coordination: Coordination normal.     Comments: The patient is able to move all 4 extremities, normal strength when prompted, seems to have give out weakness of the bilateral legs in  the bilateral arms, there is no obvious facial droop, at times she is totally awake opening her eyes and answering questions clearly and at times she is closed eyes stuttering and speaking softly.  She has no facial droop, she can follow commands intermittently  Psychiatric:        Behavior: Behavior normal.     ED Results / Procedures / Treatments   Labs (all labs ordered are listed, but only abnormal results are displayed) Labs Reviewed  CBG MONITORING, ED - Abnormal; Notable for the following components:      Result Value   Glucose-Capillary 107 (*)    All other components within normal limits  CBC WITH DIFFERENTIAL/PLATELET  COMPREHENSIVE METABOLIC PANEL  TROPONIN I (HIGH SENSITIVITY)  TROPONIN I (HIGH SENSITIVITY)    EKG EKG Interpretation  Date/Time:  Friday August 03 2019 18:42:52 EDT Ventricular Rate:  74 PR Interval:    QRS Duration: 120 QT Interval:  471 QTC Calculation: 523 R Axis:   66 Text Interpretation: Sinus rhythm Left ventricular hypertrophy Prolonged QT interval Baseline wander in lead(s) II III aVF V6 Confirmed by Noemi Chapel [phone]) on 08/03/2019 6:53:02 PM   Radiology CT Head Wo Contrast  Result Date: 08/03/2019 CLINICAL DATA:  Severe headache,  syncope; syncope, simple, normal neuro exam. EXAM: CT HEAD WITHOUT CONTRAST TECHNIQUE: Contiguous axial images were obtained from the base of the skull through the vertex without intravenous contrast. COMPARISON:  Head CT 10/24/2018, brain MRI 11/25/2015 FINDINGS: Brain: There is no evidence of acute intracranial hemorrhage, intracranial mass, midline shift or extra-axial fluid collection.No demarcated cortical infarction. Vascular: No hyperdense vessel.  Atherosclerotic calcifications. Skull: Normal. Negative for fracture or focal lesion. Sinuses/Orbits: Visualized orbits demonstrate no acute abnormality. Mild ethmoid sinus mucosal thickening. No significant mastoid effusion. IMPRESSION: No evidence of acute intracranial abnormality. Mild ethmoid sinus mucosal thickening. Electronically Signed   By: Kellie Simmering DO   On: 08/03/2019 19:20   DG Chest Port 1 View  Result Date: 08/03/2019 CLINICAL DATA:  Chest pain. EXAM: PORTABLE CHEST 1 VIEW COMPARISON:  Chest radiograph 10/24/2018 FINDINGS: Heart size within normal limits. No evidence of airspace consolidation within the lungs. No evidence of pleural effusion or pneumothorax. No acute bony abnormality. IMPRESSION: No evidence of acute cardiopulmonary abnormality. Electronically Signed   By: Kellie Simmering DO   On: 08/03/2019 19:25    Procedures Procedures (including critical care time)  Medications Ordered in ED Medications  hydrALAZINE (APRESOLINE) injection 10 mg (10 mg Intravenous Given 08/03/19 1947)  LORazepam (ATIVAN) injection 1 mg (1 mg Intravenous Given 08/03/19 1947)  HYDROcodone-acetaminophen (NORCO/VICODIN) 5-325 MG per tablet 2 tablet (2 tablets Oral Given 08/03/19 2309)    ED Course  I have reviewed the triage vital signs and the nursing notes.  Pertinent labs & imaging results that were available during my care of the patient were reviewed by me and considered in my medical decision making (see chart for details).    MDM  Rules/Calculators/A&P                      Blood sugar is 107, EKG shows no acute findings, will order a CT scan to make sure there is no subarachnoid hemorrhage given her anticoagulated state on Plavix and severe headache.  The patient will also need to have further evaluation with cardiac monitoring and a troponin secondary to the chest pain though I suspect that much of this could be related to anxiety given her home stressors.  The  patient is agreeable to the work-up and the plan.  The neurologic exam to me does not fit with a localizing lesion.  She reports that she has absolutely no sensation from the neck down on either side and in fact when I do light touch and pinprick even with severe pain on the bilateral arms and legs she does not twitch or act like it hurts.  That being said she has normal muscular and strength function.  The patient was given medications including lorazepam, hydralazine with improvement of both blood pressure and her headache. When I went to repeat evaluate the parent after all of the labs and results were back including a normal CT scan of the brain, normal chest x-ray and normal blood work including 2 - troponins. She still had moderate headache though improved. She was given 2 Vicodin, I do not think that her headache is related to a aneurysm, bleeding, trauma or tumor. She appears stable for discharge, she was instructed on the indications for return and will be discharged with metoclopramide prescription to be used with an anti-inflammatory, she is agreeable.   Final Clinical Impression(s) / ED Diagnoses Final diagnoses:  Nonintractable headache, unspecified chronicity pattern, unspecified headache type  Stress at home  Left-sided chest pain    Rx / DC Orders ED Discharge Orders         Ordered    metoCLOPramide (REGLAN) 10 MG tablet  3 times daily PRN     08/03/19 2304           Noemi Chapel, MD 08/03/19 2310

## 2019-08-07 ENCOUNTER — Other Ambulatory Visit: Payer: Self-pay

## 2019-08-07 ENCOUNTER — Ambulatory Visit (INDEPENDENT_AMBULATORY_CARE_PROVIDER_SITE_OTHER): Payer: Medicare Other

## 2019-08-07 DIAGNOSIS — R6882 Decreased libido: Secondary | ICD-10-CM

## 2019-08-08 ENCOUNTER — Ambulatory Visit: Payer: Medicare Other | Attending: Internal Medicine

## 2019-08-08 DIAGNOSIS — Z23 Encounter for immunization: Secondary | ICD-10-CM

## 2019-08-08 NOTE — Progress Notes (Signed)
   Covid-19 Vaccination Clinic  Name:  Bethany Wang    MRN: [id number] DOB: 11/13/1965  08/08/2019  [name] was observed post Covid-19 immunization for 30 minutes based on pre-vaccination screening without incident. She was provided with Vaccine Information Sheet and instruction to access the V-Safe system.   [name] was instructed to call 911 with any severe reactions post vaccine: [name] Difficulty breathing  . Swelling of face and throat  . A fast heartbeat  . A bad rash all over body  . Dizziness and weakness   Immunizations Administered    Name Date Dose VIS Date Route   Moderna COVID-19 Vaccine 08/08/2019  9:28 AM 0.5 mL 03/2019 Intramuscular   Manufacturer: Moderna   Lot: [id number]   [id number]

## 2019-08-14 ENCOUNTER — Ambulatory Visit (INDEPENDENT_AMBULATORY_CARE_PROVIDER_SITE_OTHER): Payer: Medicare Other

## 2019-08-14 ENCOUNTER — Telehealth: Payer: Self-pay | Admitting: Adult Health

## 2019-08-14 ENCOUNTER — Other Ambulatory Visit: Payer: Self-pay

## 2019-08-14 ENCOUNTER — Other Ambulatory Visit (INDEPENDENT_AMBULATORY_CARE_PROVIDER_SITE_OTHER): Payer: Self-pay | Admitting: Internal Medicine

## 2019-08-14 DIAGNOSIS — R6882 Decreased libido: Secondary | ICD-10-CM

## 2019-08-14 MED ORDER — PREDNISONE 20 MG PO TABS: 40.0000 mg | ORAL_TABLET | Freq: Every day | ORAL | 1 refills | Status: DC

## 2019-08-14 NOTE — Progress Notes (Signed)
Pt was given 0.63mL to the right thigh. Pt tolerated; well no complaints.

## 2019-08-14 NOTE — Telephone Encounter (Signed)
Called patient regarding appointment scheduled in our office and advised to come alone to the visit.   Pre-screen questions asked: 1. Any of the following symptoms of COVID such as chills, fever, cough, shortness of breath, muscle pain, diarrhea, rash, vomiting, abdominal pain, red eye, weakness, bruising, bleeding, joint pain, loss of taste or smell, a severe headache, sore throat, fatigue 2. Any exposure to anyone suspected or confirmed of having COVID-19 3. Awaiting test results for COVID-19  Also,to keep you safe, please use the provided hand sanitizer when you enter the office. We are asking everyone in the office to wear a mask to help prevent the spread of germs. If you have a mask of your own, please wear it to your appointment, if not, we are happy to provide one for you.  Thank you for understanding and your cooperation.    [facility] Staff    

## 2019-08-15 ENCOUNTER — Ambulatory Visit (INDEPENDENT_AMBULATORY_CARE_PROVIDER_SITE_OTHER): Payer: Medicare Other | Admitting: Adult Health

## 2019-08-15 ENCOUNTER — Encounter: Payer: Self-pay | Admitting: Adult Health

## 2019-08-15 ENCOUNTER — Other Ambulatory Visit: Payer: Self-pay

## 2019-08-15 VITALS — BP 187/104 | HR 72 | Ht 64.0 in | Wt 166.0 lb

## 2019-08-15 DIAGNOSIS — Z113 Encounter for screening for infections with a predominantly sexual mode of transmission: Secondary | ICD-10-CM | POA: Insufficient documentation

## 2019-08-15 DIAGNOSIS — I10 Essential (primary) hypertension: Secondary | ICD-10-CM

## 2019-08-15 DIAGNOSIS — N898 Other specified noninflammatory disorders of vagina: Secondary | ICD-10-CM

## 2019-08-15 NOTE — Progress Notes (Signed)
  Subjective:     Patient ID: Bethany Wang, female   DOB: 08/26/1965, 54 y.o.   MRN: [id number]  HPI Bethany Wang is a 54 year old black female, married, sp hysterectomy in requesting STD testing, has vaginal discharge and some itching. PCP is Dr Bethany Wang.   Review of Systems Vaginal discharge and itching at times Denies any odor or burning +stressors, mom died, had to move out of house and husband cheated  Reviewed past medical,surgical, social and family history. Reviewed medications and allergies.      Objective:   Physical Exam BP (!) 187/104 (BP Location: Left Arm, Patient Position: Sitting, Cuff Size: Normal)   Pulse 72   Ht 5\' 4"  (1.626 m)   Wt 166 lb (75.3 kg)   BMI 28.49 kg/m Skin warm and dry.  Lungs: clear to ausculation bilaterally. Cardiovascular: regular rate and rhythm. Pelvic: external genitalia is normal in appearance no lesions, vagina: white discharge with slight odor,urethra has no lesions or masses noted, cervix and uterus are absent, adnexa: no masses or tenderness noted. Bladder is non tender and no masses felt. Nuswab obtained AA 0 Fall risk is high, assisted off exam table PHQ 9 score is 15, is on meds, no SI    Assessment:     1. Vaginal discharge Nuswab sent   2. Vaginal itching Nuswab sent  3. Screening examination for STD (sexually transmitted disease) Nuswab sent She declines HIV and RPR, she says had at PCP  4. Hypertension, unspecified type On meds, follow up with PCP    Plan:    Will talk when results back Follow up prn

## 2019-08-18 LAB — NUSWAB VAGINITIS PLUS (VG+)
Atopobium vaginae: HIGH Score — AB
BVAB 2: HIGH Score — AB
Candida albicans, NAA: NEGATIVE
Candida glabrata, NAA: NEGATIVE
Chlamydia trachomatis, NAA: NEGATIVE
Megasphaera 1: HIGH Score — AB
Neisseria gonorrhoeae, NAA: NEGATIVE
Trich vag by NAA: POSITIVE — AB

## 2019-08-18 LAB — SPECIMEN STATUS REPORT

## 2019-08-20 ENCOUNTER — Encounter: Payer: Self-pay | Admitting: Adult Health

## 2019-08-20 ENCOUNTER — Telehealth: Payer: Self-pay | Admitting: Adult Health

## 2019-08-20 DIAGNOSIS — A599 Trichomoniasis, unspecified: Secondary | ICD-10-CM | POA: Insufficient documentation

## 2019-08-20 HISTORY — DX: Trichomoniasis, unspecified: A59.9

## 2019-08-20 MED ORDER — METRONIDAZOLE 500 MG PO TABS: 500.0000 mg | ORAL_TABLET | Freq: Three times a day (TID) | ORAL | 0 refills | Status: DC

## 2019-08-20 NOTE — Telephone Encounter (Signed)
Pt aware that NUswab +BV and trich, will rx flagyl, no sex, POC in about 10 days.partner already treated she says

## 2019-08-21 ENCOUNTER — Other Ambulatory Visit: Payer: Self-pay

## 2019-08-21 ENCOUNTER — Ambulatory Visit (INDEPENDENT_AMBULATORY_CARE_PROVIDER_SITE_OTHER): Payer: Medicare Other

## 2019-08-21 DIAGNOSIS — R6882 Decreased libido: Secondary | ICD-10-CM

## 2019-08-21 NOTE — Progress Notes (Signed)
Pt was given 0.48ml to the left thigh. Pt tolerated well; no complaint.

## 2019-08-28 ENCOUNTER — Other Ambulatory Visit: Payer: Self-pay

## 2019-08-28 ENCOUNTER — Ambulatory Visit (INDEPENDENT_AMBULATORY_CARE_PROVIDER_SITE_OTHER): Payer: Medicare Other

## 2019-08-28 ENCOUNTER — Other Ambulatory Visit (INDEPENDENT_AMBULATORY_CARE_PROVIDER_SITE_OTHER): Payer: Self-pay

## 2019-08-28 VITALS — HR 89 | Temp 98.1°F

## 2019-08-28 DIAGNOSIS — R6882 Decreased libido: Secondary | ICD-10-CM

## 2019-08-28 DIAGNOSIS — M79671 Pain in right foot: Secondary | ICD-10-CM

## 2019-08-28 NOTE — Progress Notes (Signed)
Pt is given 0.15mL to the right thigh. Pt tolerated well no complaints.

## 2019-08-28 NOTE — Progress Notes (Signed)
Pt came into office today for weekly testosterone injection. Pt went to [facility] ER. Pt stated it is a object she can feel inside the skin.Stated Feels like sand; only feel when the foot is swollen.

## 2019-08-30 ENCOUNTER — Telehealth (INDEPENDENT_AMBULATORY_CARE_PROVIDER_SITE_OTHER): Payer: Self-pay

## 2019-08-30 ENCOUNTER — Other Ambulatory Visit (INDEPENDENT_AMBULATORY_CARE_PROVIDER_SITE_OTHER): Payer: Self-pay | Admitting: Internal Medicine

## 2019-08-30 NOTE — Telephone Encounter (Signed)
[place] with the [employer] is calling stating that Bethany Wang is asking for a Rx for 2 Grab Bars to go in her shower to keep from falling, please advise?

## 2019-09-02 ENCOUNTER — Encounter (HOSPITAL_COMMUNITY): Payer: Self-pay | Admitting: Emergency Medicine

## 2019-09-02 ENCOUNTER — Emergency Department (HOSPITAL_COMMUNITY)
Admission: EM | Admit: 2019-09-02 | Discharge: 2019-09-03 | Discharge status: Against Medical Advice | Payer: Medicare Other | Attending: Emergency Medicine | Admitting: Emergency Medicine

## 2019-09-02 ENCOUNTER — Other Ambulatory Visit: Payer: Self-pay

## 2019-09-02 DIAGNOSIS — J209 Acute bronchitis, unspecified: Secondary | ICD-10-CM

## 2019-09-02 DIAGNOSIS — J40 Bronchitis, not specified as acute or chronic: Secondary | ICD-10-CM | POA: Diagnosis not present

## 2019-09-02 DIAGNOSIS — I119 Hypertensive heart disease without heart failure: Secondary | ICD-10-CM | POA: Insufficient documentation

## 2019-09-02 DIAGNOSIS — Z7902 Long term (current) use of antithrombotics/antiplatelets: Secondary | ICD-10-CM | POA: Diagnosis not present

## 2019-09-02 DIAGNOSIS — Z20822 Contact with and (suspected) exposure to covid-19: Secondary | ICD-10-CM | POA: Diagnosis not present

## 2019-09-02 DIAGNOSIS — I2510 Atherosclerotic heart disease of native coronary artery without angina pectoris: Secondary | ICD-10-CM | POA: Diagnosis not present

## 2019-09-02 DIAGNOSIS — R0789 Other chest pain: Secondary | ICD-10-CM | POA: Diagnosis present

## 2019-09-02 DIAGNOSIS — J9801 Acute bronchospasm: Secondary | ICD-10-CM | POA: Insufficient documentation

## 2019-09-02 DIAGNOSIS — Z9861 Coronary angioplasty status: Secondary | ICD-10-CM | POA: Insufficient documentation

## 2019-09-02 DIAGNOSIS — Z79899 Other long term (current) drug therapy: Secondary | ICD-10-CM | POA: Insufficient documentation

## 2019-09-02 NOTE — ED Triage Notes (Signed)
Patient presents with chest pain x 2 days. Patient also states shortness of breath. Patient had 6 baby Aspirin prior to EMS arrival and 2 nitro prior to EMS arrival.

## 2019-09-03 ENCOUNTER — Other Ambulatory Visit (INDEPENDENT_AMBULATORY_CARE_PROVIDER_SITE_OTHER): Payer: Self-pay | Admitting: Internal Medicine

## 2019-09-03 ENCOUNTER — Emergency Department (HOSPITAL_COMMUNITY): Payer: Medicare Other

## 2019-09-03 DIAGNOSIS — J40 Bronchitis, not specified as acute or chronic: Secondary | ICD-10-CM | POA: Diagnosis not present

## 2019-09-03 DIAGNOSIS — R269 Unspecified abnormalities of gait and mobility: Secondary | ICD-10-CM

## 2019-09-03 LAB — CBC
HCT: 41 % (ref 36.0–46.0)
Hemoglobin: 13.3 g/dL (ref 12.0–15.0)
MCH: 27.3 pg (ref 26.0–34.0)
MCHC: 32.4 g/dL (ref 30.0–36.0)
MCV: 84 fL (ref 80.0–100.0)
Platelets: 267 10*3/uL (ref 150–400)
RBC: 4.88 MIL/uL (ref 3.87–5.11)
RDW: 14.6 % (ref 11.5–15.5)
WBC: 7.6 10*3/uL (ref 4.0–10.5)
nRBC: 0 % (ref 0.0–0.2)

## 2019-09-03 LAB — BASIC METABOLIC PANEL
Anion gap: 12 (ref 5–15)
BUN: 9 mg/dL (ref 6–20)
CO2: 24 mmol/L (ref 22–32)
Calcium: 8.9 mg/dL (ref 8.9–10.3)
Chloride: 103 mmol/L (ref 98–111)
Creatinine, Ser: 0.83 mg/dL (ref 0.44–1.00)
GFR calc Af Amer: >60 mL/min (ref >60)
GFR calc non Af Amer: >60 mL/min (ref >60)
Glucose, Bld: 119 mg/dL — ABNORMAL HIGH (ref 70–99)
Potassium: 3.4 mmol/L — ABNORMAL LOW (ref 3.5–5.1)
Sodium: 139 mmol/L (ref 135–145)

## 2019-09-03 LAB — TROPONIN I (HIGH SENSITIVITY)
Troponin I (High Sensitivity): 3 ng/L (ref <18)
Troponin I (High Sensitivity): 4 ng/L (ref <18)

## 2019-09-03 LAB — SARS CORONAVIRUS 2 (TAT 6-24 HRS): SARS Coronavirus 2: NEGATIVE

## 2019-09-03 MED ORDER — PREDNISONE 20 MG PO TABS
40.0000 mg | ORAL_TABLET | Freq: Once | ORAL | Status: AC
  Administered 2019-09-03: 40 mg via ORAL
  Filled 2019-09-03: qty 2

## 2019-09-03 MED ORDER — AEROCHAMBER Z-STAT PLUS/MEDIUM MISC
1.0000 | Freq: Once | Status: AC
  Administered 2019-09-03: 1
  Filled 2019-09-03: qty 1

## 2019-09-03 MED ORDER — ALBUTEROL SULFATE HFA 108 (90 BASE) MCG/ACT IN AERS
2.0000 | INHALATION_SPRAY | Freq: Once | RESPIRATORY_TRACT | Status: AC
  Administered 2019-09-03: 2 via RESPIRATORY_TRACT
  Filled 2019-09-03: qty 6.7

## 2019-09-03 NOTE — Telephone Encounter (Signed)
Please see the note on this patient.  Please fax the order to the number given.  Thanks

## 2019-09-03 NOTE — ED Notes (Signed)
Patient left AMA and advised she would follow up with her Primary Care Doctor this a.m. Patient stated that she was tired of waiting and having no answers. Patient was given her lab results, chest x-ray results before she signed out AMA. Patient also given her inhaler and aero chamber spacer to take home and advised to check her My Chart account for her COVID results later today. IV removed and site wrapped with pressure dressing due to patient being on blood thinners. Patient signed AMA form.

## 2019-09-03 NOTE — ED Provider Notes (Addendum)
[facility] EMERGENCY DEPARTMENT Provider Note   CSN: [id number] Arrival date & time: 09/02/19  2323   Time seen 12:58 AM  History Chief Complaint  Patient presents with  . Chest Pain    Bethany Wang is a 54 y.o. female.  HPI Patient has a history of coronary artery disease and states she has had 4 stents however she states Dr. Tamala Julian, cardiologist has told her she cannot have any more stents because her stents close up afterwards.  She states she started getting left sided chest pain 2 days ago that has been there constantly.  She also has had a dry cough for 2 days "constantly".  She denies nausea, vomiting, diaphoresis, rhinorrhea, sore throat, loss of taste or smell.  She states she does feel short of breath and she has had some mild wheezing.  She states she had to use an inhaler before about 3 years ago when she had bronchitis.  She states coughing makes the pain worse, nothing makes it feel better.  She has taken Mucinex, TheraFlu, nitroglycerin x2, and aspirin without relief.  She states she had the Covid vaccine, Moderna  in March and April.  She does however states this pain feels like when she needed stents before.  PCP [name], MD Cardiology Dr. Domenic Polite    Past Medical History:  Diagnosis Date  . Anemia   . Anxiety   . Bilateral breast cysts 12/30/2015  . Brain tumor ([place])   . Breast cancer ([place])    Left Breast Cancer  . Breast disorder   . BV (bacterial vaginosis) 09/09/2015  . Coronary atherosclerosis of native coronary artery    a. s/p DES to RCA in 2013 b. DES to LAD in 2014 c. cath in 04/2016 showing patent LAD stent with D2 jailed by LAD stent and CTO of RCA with left to right collaterals present  . Cyst of pharynx or nasopharynx    [name]  . Decreased libido 12/25/2018  . Depression   . Essential hypertension   . Falls    x 3-4 in past year  . GERD (gastroesophageal reflux disease)   . Headache   . History of hematuria    . Mixed hyperlipidemia   . Personal history of chemotherapy    Left Breast Cancer  . Personal history of radiation therapy    Left Breast Cancer  . Pre-diabetes   . PUD (peptic ulcer disease)   . Seizures ([place])   . Shingles 09/09/2015  . Stroke ([place])    12-2017 on Plavix, only deficit is headaches  . Suicide attempt ([place])    3 attempts in remote past  . Trichimoniasis 08/20/2019   Treated 08/20/19   . Uterine cancer ([place])   . Vitamin D deficiency disease 04/03/2019    Patient Active Problem List   Diagnosis Date Noted  . Trichimoniasis 08/20/2019  . Screening examination for STD (sexually transmitted disease) 08/15/2019  . Vaginal itching 08/15/2019  . Hypertension 08/15/2019  . Vitamin D deficiency disease 04/03/2019  . Decreased libido 12/25/2018  . Angina pectoris ([place]) 10/25/2018  . Palpitations 06/06/2018  . Chest pain 07/13/2017  . Nonspecific chest pain 07/13/2017  . Mixed hyperlipidemia 07/13/2017  . Precordial chest pain   . Hypertensive urgency 06/15/2016  . Unstable angina pectoris ([place]) 06/15/2016  . Unstable angina ([place]) 06/15/2016  . Bilateral breast cysts 12/30/2015  . Vaginal discharge 09/09/2015  . BV (bacterial vaginosis) 09/09/2015  . Shingles 09/09/2015  . History of  breast cancer 09/09/2015  . Syncope and collapse 08/08/2015  . Migraine with aura and with status migrainosus, not intractable 08/08/2015  . Chronic low back pain 08/08/2015  . Insomnia 08/08/2015  . Anxiety state 08/08/2015  . Panic attacks 08/08/2015  . Anemia, iron deficiency 08/08/2015  . Pica 08/08/2015  . Angina decubitus ([place]) 07/03/2014  . 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 Encounter for gynecological examination with Papanicolaou smear of cervix 02/04/2014  . Syncope 12/14/2013  . Cervical disc disorder with radiculopathy of cervical region 02/19/2013  . H/O rotator cuff surgery 02/19/2013  . Dyspareunia 02/01/2013  . Left shoulder pain 01/24/2013  . Rotator  cuff tear 01/24/2013  . Radicular pain 01/24/2013  . Labral tear of shoulder 01/24/2013  . Complex regional pain syndrome of upper extremity 01/24/2013  . Herniated disc, cervical 01/24/2013  . Nausea and vomiting 01/18/2013  . Rectal bleeding 01/18/2013  . Edema of left foot 10/16/2012  . Coronary atherosclerosis of native coronary artery   . Essential hypertension, benign   . HLD (hyperlipidemia)   . Tobacco abuse     Past Surgical History:  Procedure Laterality Date  . ABDOMINAL HYSTERECTOMY    . BLADDER SURGERY    . BREAST LUMPECTOMY Left   . BREAST LUMPECTOMY Right 02/24/2018   Procedure: RIGHT BREAST LUMPECTOMY ERAS PATHWAY;  Surgeon: Jovita Kussmaul, MD;  Location: [facility];  Service: General;  Laterality: Right;  . CARDIAC CATHETERIZATION N/A 05/10/2016   Procedure: Left Heart Cath and Coronary Angiography;  Surgeon: Belva Crome, MD;  Location: [place] [facility];  Service: Cardiovascular;  Laterality: N/A;  . COLONOSCOPY  May 2010   Fleishman: normal rectum, internal hemorrhoids, , benign colonic polyp  . COLONOSCOPY N/A 01/05/2017   Procedure: COLONOSCOPY;  Surgeon: Rogene Houston, MD;  [facility];  Service: Endoscopy;  Laterality: N/A;  1:00  . ESOPHAGOGASTRODUODENOSCOPY     2001 Dr. Amedeo Plenty: distal esophagitis, small hiatal hernia,. Dr. Tamala Julian 2006? no records available currently, pt also reports  EGD a few years ago with Dr. Gala Romney, do not have these reports anywhere in medical records  . ESOPHAGOGASTRODUODENOSCOPY  06/02/2011   [id number] pill impaction as described above s/p dilation of a probable cervical esophageal web/bx abnormal esophageal and gastric mucosa. + H.pylori gastritis   . EYE SURGERY     Removed glass  . HAND SURGERY Right   . Left breast lumpectomy     Benign  . LEFT HEART CATH AND CORONARY ANGIOGRAPHY N/A 10/25/2018   Procedure: LEFT HEART CATH AND CORONARY ANGIOGRAPHY;  Surgeon: Wellington Hampshire, MD;  Location: [facility];   Service: Cardiovascular;  Laterality: N/A;  . LEFT HEART CATHETERIZATION WITH CORONARY ANGIOGRAM N/A 07/03/2014   Procedure: LEFT HEART CATHETERIZATION WITH CORONARY ANGIOGRAM;  Surgeon: Belva Crome, MD;  Location[facility] C[facility];  Service: Cardiovascular;  Laterality: N/A;  . POLYPECTOMY  01/05/2017   Procedure: POLYPECTOMY;  Surgeon: Rogene Houston, MD;  [facility];  Service: Endoscopy;;  colon  . SHOULDER SURGERY Left      OB History    Gravida  4   Para  3   Term      Preterm      AB  1   Living  3     SAB  1   TAB      Ectopic      Multiple      Live Births  Family History  Problem Relation Age of Onset  . Colon cancer Paternal Grandfather   . Cancer Father   . Cancer Maternal Uncle   . Alzheimer's disease Paternal Aunt   . Cirrhosis Maternal Uncle   . Cancer Paternal Aunt        lung  . Aneurysm Mother        brain  . Heart disease Brother   . Heart attack Brother   . Mental illness Daughter   . ADD / ADHD Daughter   . Bipolar disorder Daughter   . Mental illness Son   . ADD / ADHD Son   . Bipolar disorder Son   . Mental illness Son   . ADD / ADHD Son   . Bipolar disorder Son     Social History   Tobacco Use  . Smoking status: Former Smoker    Packs/day: 0.20    Years: 32.00    Pack years: 6.40    Types: Cigarettes, Cigars    Start date: 07/05/1983    Quit date: 04/03/2012    Years since quitting: 7.4  . Smokeless tobacco: Never Used  Substance Use Topics  . Alcohol use: No    Alcohol/week: 0.0 standard drinks  . Drug use: No  States she quit smoking 5 years ago  Home Medications Prior to Admission medications   Medication Sig Start Date End Date Taking? Authorizing Provider  ALPRAZolam Duanne Moron) 0.5 MG tablet Take 1 tablet (0.5 mg total) by mouth every morning. 05/08/19   [name], MD  amLODipine (NORVASC) 10 MG tablet Take 1 tablet (10 mg total) by mouth daily. 11/23/18   Barrett, Evelene Croon, PA-C   aspirin EC 81 MG tablet Take 162 mg by mouth 2 (two) times daily.    [provider]  atorvastatin (LIPITOR) 40 MG tablet Take 1 tablet (40 mg total) by mouth daily at 6 PM. 06/16/16   Eber Jones, MD  Cholecalciferol (VITAMIN D3) 25 MCG (1000 UT) CHEW Chew 2,000 Units by mouth daily.    [provider]  clopidogrel (PLAVIX) 75 MG tablet Take 75 mg by mouth daily.  12/13/17   [provider]  escitalopram (LEXAPRO) 20 MG tablet Take 20 mg by mouth daily.  11/03/15   [provider]  fluticasone (FLONASE) 50 MCG/ACT nasal spray Place 1 spray into both nostrils daily. 03/13/19   Kendrick, Caitlyn S, PA-C  isosorbide mononitrate (IMDUR) 30 MG 24 hr tablet Take 3 tablets (90 mg total) by mouth daily. 10/27/18   Cheryln Manly, NP  LINZESS 145 MCG CAPS capsule Take 145 mcg by mouth daily.  10/17/18   [provider]  losartan (COZAAR) 100 MG tablet Take 1 tablet (100 mg total) by mouth daily. 06/12/19   Ailene Ards, NP  Medical ID Bracelet MISC 1 Units by Does not apply route daily. 03/06/19   [name], MD  metoCLOPramide (REGLAN) 10 MG tablet Take 1 tablet (10 mg total) by mouth 3 (three) times daily as needed for nausea (headache / nausea). 08/03/19   Noemi Chapel, MD  metoprolol tartrate (LOPRESSOR) 50 MG tablet Take 1.5 tablets (75 mg total) by mouth 2 (two) times daily. 05/07/19   Satira Sark, MD  metroNIDAZOLE (FLAGYL) 500 MG tablet Take 1 tablet (500 mg total) by mouth 3 (three) times daily. 08/20/19   Estill Dooms, NP  nitroGLYCERIN (NITROSTAT) 0.4 MG SL tablet Place 1 tablet (0.4 mg total) under the tongue every 5 (  five) minutes as needed for chest pain. 07/11/19   Satira Sark, MD  NONFORMULARY OR COMPOUNDED ITEM Inject 5 mg into the muscle every 7 (seven) days. Testosterone cypionate in olive  oil (25 mg/mL).  Dispense 5 mL vial. 06/11/19   Gosrani, Nimish C, MD  potassium chloride 20 MEQ/15ML (10%) SOLN Take 15 mLs by  mouth daily. 10/31/18   [provider]  predniSONE (DELTASONE) 20 MG tablet Take 2 tablets (40 mg total) by mouth daily with breakfast. 08/14/19   [name], MD  thyroid (NP THYROID) 120 MG tablet Take 1 tablet (120 mg total) by mouth daily before breakfast. 07/09/19   [name], MD    Allergies    Bee venom, Penicillins, and Shellfish allergy  Review of Systems   Review of Systems  All other systems reviewed and are negative.   Physical Exam Updated Vital Signs BP (!) 176/93   Pulse 66   Temp 98.4 F (36.9 C) (Oral)   Resp 16   Ht 5\' 4"  (1.626 m)   Wt 69.9 kg   SpO2 100%   BMI 26.43 kg/m   Physical Exam Vitals and nursing note reviewed.  Constitutional:      Appearance: Normal appearance. She is normal weight.     Comments: Patient does not seem to want to answer any questions  HENT:     Head: Normocephalic and atraumatic.     Nose: Nose normal.  Eyes:     Extraocular Movements: Extraocular movements intact.     Conjunctiva/sclera: Conjunctivae normal.     Pupils: Pupils are equal, round, and reactive to light.  Cardiovascular:     Rate and Rhythm: Normal rate and regular rhythm.     Pulses: Normal pulses.  Pulmonary:     Effort: Pulmonary effort is normal. No tachypnea, accessory muscle usage, prolonged expiration, respiratory distress or retractions.     Comments: Patient has a few scattered wheezes that clear when she coughs Chest:     Chest wall: Tenderness present.    Abdominal:     General: Abdomen is flat.     Palpations: Abdomen is soft.     Tenderness: There is no abdominal tenderness.  Musculoskeletal:     Cervical back: Normal range of motion.     Right lower leg: No edema.     Left lower leg: No edema.  Skin:    General: Skin is warm and dry.  Neurological:     General: No focal deficit present.     Mental Status: She is alert and oriented to person, place, and time.     Cranial Nerves: No cranial nerve deficit.   Psychiatric:        Mood and Affect: Mood normal.        Behavior: Behavior normal.        Thought Content: Thought content normal.     ED Results / Procedures / Treatments   Labs (all labs ordered are listed, but only abnormal results are displayed) Results for orders placed or performed during the hospital encounter of 09/02/19  CBC  Result Value Ref Range   WBC 7.6 4.0 - 10.5 K/uL   RBC 4.88 3.87 - 5.11 MIL/uL   Hemoglobin 13.3 12.0 - 15.0 g/dL   HCT 41.0 36.0 - 46.0 %   MCV 84.0 80.0 - 100.0 fL   MCH 27.3 26.0 - 34.0 pg   MCHC 32.4 30.0 - 36.0 g/dL   RDW 14.6 11.5 - 15.5 %  Platelets 267 150 - 400 K/uL   nRBC 0.0 0.0 - 0.2 %  Basic metabolic panel  Result Value Ref Range   Sodium 139 135 - 145 mmol/L   Potassium 3.4 (L) 3.5 - 5.1 mmol/L   Chloride 103 98 - 111 mmol/L   CO2 24 22 - 32 mmol/L   Glucose, Bld 119 (H) 70 - 99 mg/dL   BUN 9 6 - 20 mg/dL   Creatinine, Ser 0.83 0.44 - 1.00 mg/dL   Calcium 8.9 8.9 - 10.3 mg/dL   GFR calc non Af Amer >60 >60 mL/min   GFR calc Af Amer >60 >60 mL/min   Anion gap 12 5 - 15  Troponin I (High Sensitivity)  Result Value Ref Range   Troponin I (High Sensitivity) 3 <18 ng/L  Troponin I (High Sensitivity)  Result Value Ref Range   Troponin I (High Sensitivity) 4 <18 ng/L   Laboratory interpretation all normal except mild hypokalemia, nonfasting hyperglycemia   EKG None  ED ECG REPORT   Date: 09/03/2019  Rate: 75  Rhythm: normal sinus rhythm  QRS Axis: right  Intervals: normal  ST/T Wave abnormalities: normal  Conduction Disutrbances:LVH  Narrative Interpretation:   Old EKG Reviewed: none available  I have personally reviewed the EKG tracing and agree with the computerized printout as noted.   Radiology DG Chest Port 1 View  Result Date: 09/03/2019 CLINICAL DATA:  Chest pain and cough. EXAM: PORTABLE CHEST 1 VIEW COMPARISON:  08/03/2019 FINDINGS: The heart size and mediastinal contours are within normal limits.  Both lungs are clear. The visualized skeletal structures are unremarkable. IMPRESSION: No active disease. Electronically Signed   By: Constance Holster M.D.   On: 09/03/2019 02:09    Procedures Procedures (including critical care time)  Medications Ordered in ED Medications  albuterol (VENTOLIN HFA) 108 (90 Base) MCG/ACT inhaler 2 puff (2 puffs Inhalation Given 09/03/19 0122)  predniSONE (DELTASONE) tablet 40 mg (40 mg Oral Given 09/03/19 0120)  aerochamber Z-Stat Plus/medium 1 each (1 each Other Given 09/03/19 0121)    ED Course  I have reviewed the triage vital signs and the nursing notes.  Pertinent labs & imaging results that were available during my care of the patient were reviewed by me and considered in my medical decision making (see chart for details).    MDM Rules/Calculators/A&P                      Patient has had chest pain that appears to be musculoskeletal constantly for the past 2 days.  We discussed her test results.  Her initial troponin is well within normal.  We discussed with her constant chest pain that if she was having a heart attack it would be abnormal by now.  Her blood sugar is 119 and she states her diabetes is easy to control because she follows her diet closely.  Patient was given low-dose prednisone and given albuterol inhaler for her wheezing.  Chest x-ray was ordered.  Patient is agreeable to getting a Covid test.  3:09 AM nurses report patient left AMA.  She was given the inhaler and spacer to use.  Patient states she would follow-up with her primary care doctor.  Final Clinical Impression(s) / ED Diagnoses Final diagnoses:  Bronchitis with bronchospasm    Rx / DC Orders  Pt left AMA  Rolland Porter, MD, Barbette Or, MD 09/03/19 [id number]    Rolland Porter, MD 09/03/19 [phone]

## 2019-09-03 NOTE — Progress Notes (Signed)
dme

## 2019-09-03 NOTE — Telephone Encounter (Signed)
FAXED DME ORDER TO [facility]

## 2019-09-04 ENCOUNTER — Ambulatory Visit (INDEPENDENT_AMBULATORY_CARE_PROVIDER_SITE_OTHER): Payer: Medicare Other | Admitting: Internal Medicine

## 2019-09-04 ENCOUNTER — Other Ambulatory Visit: Payer: Self-pay

## 2019-09-04 ENCOUNTER — Ambulatory Visit (INDEPENDENT_AMBULATORY_CARE_PROVIDER_SITE_OTHER): Payer: Medicare Other

## 2019-09-04 ENCOUNTER — Encounter (INDEPENDENT_AMBULATORY_CARE_PROVIDER_SITE_OTHER): Payer: Self-pay | Admitting: Internal Medicine

## 2019-09-04 VITALS — BP 144/86 | HR 78 | Temp 97.7°F | Resp 18

## 2019-09-04 VITALS — BP 144/86 | HR 78 | Temp 97.7°F | Resp 18 | Ht 66.0 in | Wt 164.4 lb

## 2019-09-04 DIAGNOSIS — R1310 Dysphagia, unspecified: Secondary | ICD-10-CM | POA: Diagnosis not present

## 2019-09-04 DIAGNOSIS — R6882 Decreased libido: Secondary | ICD-10-CM

## 2019-09-04 DIAGNOSIS — R269 Unspecified abnormalities of gait and mobility: Secondary | ICD-10-CM | POA: Diagnosis not present

## 2019-09-04 DIAGNOSIS — I10 Essential (primary) hypertension: Secondary | ICD-10-CM | POA: Diagnosis not present

## 2019-09-04 MED ORDER — PREDNISONE 20 MG PO TABS: 40.0000 mg | ORAL_TABLET | Freq: Every day | ORAL | 1 refills | Status: DC

## 2019-09-04 MED ORDER — FLUTICASONE-SALMETEROL 100-50 MCG/DOSE IN AEPB: 1.0000 | INHALATION_SPRAY | Freq: Two times a day (BID) | RESPIRATORY_TRACT | 3 refills | Status: DC

## 2019-09-04 MED ORDER — FIRST-TESTOSTERONE MC 2 % TD CREA
5.0000 mg | TOPICAL_CREAM | Freq: Every day | TRANSDERMAL | 0 refills | Status: DC
Start: 2019-09-04 — End: 2020-05-20

## 2019-09-04 NOTE — Progress Notes (Signed)
Pt was given 0.64mL injection left thigh. Pt tolerated well.

## 2019-09-04 NOTE — Progress Notes (Signed)
Metrics: Intervention Frequency ACO  Documented Smoking Status Yearly  Screened one or more times in 24 months  Cessation Counseling or  Active cessation medication Past 24 months  Past 24 months   Guideline developer: UpToDate (See UpToDate for funding source) Date Released: 2014       Wellness Office Visit  Subjective:  Patient ID: Bethany Wang, female    DOB: 1965-05-06  Age: 54 y.o. MRN: [id number]  CC: This lady comes in for follow-up of her multiple medical problems which include hypertension, symptoms of thyroid deficiency, symptoms of testosterone deficiency. HPI  She went to the emergency room couple of nights ago with chest pain which is now resolved.  Troponin levels were negative.  She does a couple of grandparents a history of coronary artery disease. She continues with desiccated NP thyroid. She is complaining of dysphagia and she apparently has seen Dr. Radene Journey but she is not quite happy with resolution of symptoms and she would rather seek a second opinion. She had fallen in her bathroom and seems to be somewhat unsteady which requires her to have grab bars to steady herself. As far as testosterone therapy is concerned, her levels were normal range not optimized.  I think she is not consistent with getting her weekly injections because she comes to the office to get them. Whenever she is stressed, she gets more chest pain and on this occasion, she found out recently that her husband had an affair and now she is filing for divorce, all of this is caused her more stress. Past Medical History:  Diagnosis Date  . Anemia   . Anxiety   . Bilateral breast cysts 12/30/2015  . Brain tumor ([place])   . Breast cancer ([place])    Left Breast Cancer  . Breast disorder   . BV (bacterial vaginosis) 09/09/2015  . Coronary atherosclerosis of native coronary artery    a. s/p DES to RCA in 2013 b. DES to LAD in 2014 c. cath in 04/2016 showing patent LAD stent with D2 jailed by LAD  stent and CTO of RCA with left to right collaterals present  . Cyst of pharynx or nasopharynx    [name]  . Decreased libido 12/25/2018  . Depression   . Essential hypertension   . Falls    x 3-4 in past year  . GERD (gastroesophageal reflux disease)   . Headache   . History of hematuria   . Mixed hyperlipidemia   . Personal history of chemotherapy    Left Breast Cancer  . Personal history of radiation therapy    Left Breast Cancer  . Pre-diabetes   . PUD (peptic ulcer disease)   . Seizures ([place])   . Shingles 09/09/2015  . Stroke ([place])    12-2017 on Plavix, only deficit is headaches  . Suicide attempt ([place])    3 attempts in remote past  . Trichimoniasis 08/20/2019   Treated 08/20/19   . Uterine cancer ([place])   . Vitamin D deficiency disease 04/03/2019   Past Surgical History:  Procedure Laterality Date  . ABDOMINAL HYSTERECTOMY    . BLADDER SURGERY    . BREAST LUMPECTOMY Left   . BREAST LUMPECTOMY Right 02/24/2018   Procedure: RIGHT BREAST LUMPECTOMY ERAS PATHWAY;  Surgeon: Jovita Kussmaul, MD;  Location: [facility];  Service: General;  Laterality: Right;  . CARDIAC CATHETERIZATION N/A 05/10/2016   Procedure: Left Heart Cath and Coronary Angiography;  Surgeon: Belva Crome, MD;  Location: [facility]  INVASIVE CV LAB;  Service: Cardiovascular;  Laterality: N/A;  . COLONOSCOPY  May 2010   Fleishman: normal rectum, internal hemorrhoids, , benign colonic polyp  . COLONOSCOPY N/A 01/05/2017   Procedure: COLONOSCOPY;  Surgeon: Rogene Houston, MD;  [facility];  Service: Endoscopy;  Laterality: N/A;  1:00  . ESOPHAGOGASTRODUODENOSCOPY     2001 Dr. Amedeo Plenty: distal esophagitis, small hiatal hernia,. Dr. Tamala Julian 2006? no records available currently, pt also reports  EGD a few years ago with Dr. Gala Romney, do not have these reports anywhere in medical records  . ESOPHAGOGASTRODUODENOSCOPY  06/02/2011   [id number] pill impaction as described above s/p dilation of a probable  cervical esophageal web/bx abnormal esophageal and gastric mucosa. + H.pylori gastritis   . EYE SURGERY     Removed glass  . HAND SURGERY Right   . Left breast lumpectomy     Benign  . LEFT HEART CATH AND CORONARY ANGIOGRAPHY N/A 10/25/2018   Procedure: LEFT HEART CATH AND CORONARY ANGIOGRAPHY;  Surgeon: Wellington Hampshire, MD;  Location: [place] CV LAB;  Service: Cardiovascular;  Laterality: N/A;  . LEFT HEART CATHETERIZATION WITH CORONARY ANGIOGRAM N/A 07/03/2014   Procedure: LEFT HEART CATHETERIZATION WITH CORONARY ANGIOGRAM;  Surgeon: Belva Crome, MD;  Location: [facility] CATH LAB;  Service: Cardiovascular;  Laterality: N/A;  . POLYPECTOMY  01/05/2017   Procedure: POLYPECTOMY;  Surgeon: Rogene Houston, MD;  [facility];  Service: Endoscopy;;  colon  . SHOULDER SURGERY Left      Family History  Problem Relation Age of Onset  . Colon cancer Paternal Grandfather   . Cancer Father   . Cancer Maternal Uncle   . Alzheimer's disease Paternal Aunt   . Cirrhosis Maternal Uncle   . Cancer Paternal Aunt        lung  . Aneurysm Mother        brain  . Heart disease Brother   . Heart attack Brother   . Mental illness Daughter   . ADD / ADHD Daughter   . Bipolar disorder Daughter   . Mental illness Son   . ADD / ADHD Son   . Bipolar disorder Son   . Mental illness Son   . ADD / ADHD Son   . Bipolar disorder Son     Social History   Social History Narrative   Patient lives at home with her husband.On disability since age 16 yrs.Currently living in a hotel,looking for permanent residence.   Social History   Tobacco Use  . Smoking status: Former Smoker    Packs/day: 0.20    Years: 32.00    Pack years: 6.40    Types: Cigarettes, Cigars    Start date: 07/05/1983    Quit date: 04/03/2012    Years since quitting: 7.4  . Smokeless tobacco: Never Used  Substance Use Topics  . Alcohol use: No    Alcohol/week: 0.0 standard drinks    Current Meds  Medication Sig  .  ALPRAZolam (XANAX) 0.5 MG tablet Take 1 tablet (0.5 mg total) by mouth every morning.  [name] amLODipine (NORVASC) 10 MG tablet Take 1 tablet (10 mg total) by mouth daily.  [name] aspirin EC 81 MG tablet Take 162 mg by mouth 2 (two) times daily.  [name] atorvastatin (LIPITOR) 40 MG tablet Take 1 tablet (40 mg total) by mouth daily at 6 PM.  . Cholecalciferol (VITAMIN D3) 25 MCG (1000 UT) CHEW Chew 2,000 Units by mouth daily.  . clopidogrel (PLAVIX)  75 MG tablet Take 75 mg by mouth daily.   [name] escitalopram (LEXAPRO) 20 MG tablet Take 20 mg by mouth daily.   . fluticasone (FLONASE) 50 MCG/ACT nasal spray Place 1 spray into both nostrils daily.  . isosorbide mononitrate (IMDUR) 30 MG 24 hr tablet Take 3 tablets (90 mg total) by mouth daily.  [name] LINZESS 145 MCG CAPS capsule Take 145 mcg by mouth daily.   [name] losartan (COZAAR) 100 MG tablet Take 1 tablet (100 mg total) by mouth daily.  . Medical ID Bracelet MISC 1 Units by Does not apply route daily.  . metoCLOPramide (REGLAN) 10 MG tablet Take 1 tablet (10 mg total) by mouth 3 (three) times daily as needed for nausea (headache / nausea).  . metoprolol tartrate (LOPRESSOR) 50 MG tablet Take 1.5 tablets (75 mg total) by mouth 2 (two) times daily.  . metroNIDAZOLE (FLAGYL) 500 MG tablet Take 1 tablet (500 mg total) by mouth 3 (three) times daily.  . nitroGLYCERIN (NITROSTAT) 0.4 MG SL tablet Place 1 tablet (0.4 mg total) under the tongue every 5 (five) minutes as needed for chest pain.  . NONFORMULARY OR COMPOUNDED ITEM Inject 5 mg into the muscle every 7 (seven) days. Testosterone cypionate in olive  oil (25 mg/mL).  Dispense 5 mL vial.  . potassium chloride 20 MEQ/15ML (10%) SOLN Take 15 mLs by mouth daily.  [name] thyroid (NP THYROID) 120 MG tablet Take 1 tablet (120 mg total) by mouth daily before breakfast.  . [DISCONTINUED] predniSONE (DELTASONE) 20 MG tablet Take 2 tablets (40 mg total) by mouth daily with breakfast.   Current Facility-Administered Medications for the  09/04/19 encounter (Office Visit) with [name], MD  Medication  . NONFORMULARY OR COMPOUNDED ITEM 0.2 mL     Depression screen [facility] 2/9 08/15/2019 07/02/2019 03/14/2017  Decreased Interest 3 0 3  Down, Depressed, Hopeless 1 0 0  PHQ - 2 Score 4 0 3  Altered sleeping 1 - 3  Tired, decreased energy 3 - 3  Change in appetite 3 - 3  Feeling bad or failure about yourself  3 - 0  Trouble concentrating 1 - 0  Moving slowly or fidgety/restless 0 - 0  Suicidal thoughts 0 - 0  PHQ-9 Score 15 - 12  Difficult doing work/chores Very difficult - Very difficult  Some recent data might be hidden     Objective:   Today's Vitals: BP (!) 144/86 (BP Location: Left Arm, Patient Position: Sitting, Cuff Size: Normal)   Pulse 78   Temp 97.7 F (36.5 C) (Temporal)   Resp 18   Ht 5\' 6"  (1.676 m)   Wt 164 lb 6.4 oz (74.6 kg)   SpO2 95%   BMI 26.53 kg/m  Vitals with BMI 09/04/2019 09/04/2019 09/03/2019  Height 5\' 6"  - -  Weight 164 lbs 6 oz - -  BMI 123XX123 - -  Systolic 123456 123456 -  Diastolic 86 86 -  Pulse 78 78 66     Physical Exam  She looks systemically well.  Blood pressure slightly elevated but she is alert and oriented without any focal neurological signs.     Assessment   1. Essential hypertension, benign   2. Dysphagia, unspecified type   3. Decreased libido   4. Gait abnormality       Tests ordered Orders Placed This Encounter  Procedures  . For home use only DME Other see comment  . Ambulatory referral to ENT     Plan: 1.  She will continue with same medications for hypertension.  Monitor closely. 2. I will refer to ENT, Dr. Benjamine Mola, for second opinion. 3. In terms of testosterone therapy I will switch her to testosterone cream 5 mg apply to the labia daily. 4. Follow-up in about 1 month and we will see how she is and we will check blood levels.   Meds ordered this encounter  Medications  . predniSONE (DELTASONE) 20 MG tablet    Sig: Take 2 tablets (40 mg  total) by mouth daily with breakfast.    Dispense:  10 tablet    Refill:  1  . Fluticasone-Salmeterol (ADVAIR DISKUS) 100-50 MCG/DOSE AEPB    Sig: Inhale 1 puff into the lungs 2 (two) times daily.    Dispense:  60 each    Refill:  3  . Testosterone Propionate (FIRST-TESTOSTERONE MC) 2 % CREA    Sig: Place 5 mg onto the skin daily.    Dispense:  30 g    Refill:  0    Harbor Paster Luther Parody, MD

## 2019-09-11 ENCOUNTER — Ambulatory Visit (INDEPENDENT_AMBULATORY_CARE_PROVIDER_SITE_OTHER): Payer: Medicare Other

## 2019-09-11 ENCOUNTER — Other Ambulatory Visit: Payer: Self-pay

## 2019-09-11 ENCOUNTER — Encounter (INDEPENDENT_AMBULATORY_CARE_PROVIDER_SITE_OTHER): Payer: Self-pay | Admitting: Internal Medicine

## 2019-09-11 ENCOUNTER — Ambulatory Visit (INDEPENDENT_AMBULATORY_CARE_PROVIDER_SITE_OTHER): Payer: Medicare Other | Admitting: Internal Medicine

## 2019-09-11 VITALS — BP 150/70 | HR 66 | Temp 98.3°F | Resp 20 | Ht 66.0 in | Wt 166.2 lb

## 2019-09-11 DIAGNOSIS — R05 Cough: Secondary | ICD-10-CM

## 2019-09-11 DIAGNOSIS — R059 Cough, unspecified: Secondary | ICD-10-CM

## 2019-09-11 DIAGNOSIS — R6882 Decreased libido: Secondary | ICD-10-CM

## 2019-09-11 MED ORDER — GUAIFENESIN-CODEINE 100-10 MG/5ML PO SYRP: 5.0000 mL | ORAL_SOLUTION | Freq: Three times a day (TID) | ORAL | 0 refills | Status: DC | PRN

## 2019-09-11 NOTE — Progress Notes (Signed)
Pt given 0.79mL to Rt thigh.Pt tolerated well.

## 2019-09-11 NOTE — Progress Notes (Signed)
Metrics: Intervention Frequency ACO  Documented Smoking Status Yearly  Screened one or more times in 24 months  Cessation Counseling or  Active cessation medication Past 24 months  Past 24 months   Guideline developer: UpToDate (See UpToDate for funding source) Date Released: 2014       Wellness Office Visit  Subjective:  Patient ID: Bethany Wang, female    DOB: 08-22-1965  Age: 54 y.o. MRN: [id number]  CC: Cough HPI  This lady comes in with a persistent cough that she has had for about a week.  She went to the emergency room on May 24th  with chest pain and cough.  Chest x-ray was negative.  She was treated with antibiotics and steroids and most of her symptoms improved with productive cough but now she has a dry cough especially at night which bothers her and gives her some chest pain which is musculoskeletal in nature.  She also admits to some wheezing in the evenings when she coughs excessively. Past Medical History:  Diagnosis Date  . Anemia   . Anxiety   . Bilateral breast cysts 12/30/2015  . Brain tumor ([place])   . Breast cancer ([place])    Left Breast Cancer  . Breast disorder   . BV (bacterial vaginosis) 09/09/2015  . Coronary atherosclerosis of native coronary artery    a. s/p DES to RCA in 2013 b. DES to LAD in 2014 c. cath in 04/2016 showing patent LAD stent with D2 jailed by LAD stent and CTO of RCA with left to right collaterals present  . Cyst of pharynx or nasopharynx    [name]  . Decreased libido 12/25/2018  . Depression   . Essential hypertension   . Falls    x 3-4 in past year  . GERD (gastroesophageal reflux disease)   . Headache   . History of hematuria   . Mixed hyperlipidemia   . Personal history of chemotherapy    Left Breast Cancer  . Personal history of radiation therapy    Left Breast Cancer  . Pre-diabetes   . PUD (peptic ulcer disease)   . Seizures ([place])   . Shingles 09/09/2015  . Stroke ([place])    12-2017 on Plavix, only  deficit is headaches  . Suicide attempt ([place])    3 attempts in remote past  . Trichimoniasis 08/20/2019   Treated 08/20/19   . Uterine cancer ([place])   . Vitamin D deficiency disease 04/03/2019   Past Surgical History:  Procedure Laterality Date  . ABDOMINAL HYSTERECTOMY    . BLADDER SURGERY    . BREAST LUMPECTOMY Left   . BREAST LUMPECTOMY Right 02/24/2018   Procedure: RIGHT BREAST LUMPECTOMY ERAS PATHWAY;  Surgeon: Jovita Kussmaul, MD;  Location: [facility];  Service: General;  Laterality: Right;  . CARDIAC CATHETERIZATION N/A 05/10/2016   Procedure: Left Heart Cath and Coronary Angiography;  Surgeon: Belva Crome, MD;  Location: [place] CV LAB;  Service: Cardiovascular;  Laterality: N/A;  . COLONOSCOPY  May 2010   Fleishman: normal rectum, internal hemorrhoids, , benign colonic polyp  . COLONOSCOPY N/A 01/05/2017   Procedure: COLONOSCOPY;  Surgeon: Rogene Houston, MD;  [facility];  Service: Endoscopy;  Laterality: N/A;  1:00  . ESOPHAGOGASTRODUODENOSCOPY     2001 Dr. Amedeo Plenty: distal esophagitis, small hiatal hernia,. Dr. Tamala Julian 2006? no records available currently, pt also reports  EGD a few years ago with Dr. Gala Romney, do not have these reports anywhere in medical  records  . ESOPHAGOGASTRODUODENOSCOPY  06/02/2011   [id number] pill impaction as described above s/p dilation of a probable cervical esophageal web/bx abnormal esophageal and gastric mucosa. + H.pylori gastritis   . EYE SURGERY     Removed glass  . HAND SURGERY Right   . Left breast lumpectomy     Benign  . LEFT HEART CATH AND CORONARY ANGIOGRAPHY N/A 10/25/2018   Procedure: LEFT HEART CATH AND CORONARY ANGIOGRAPHY;  Surgeon: Wellington Hampshire, MD;  Location: [place] CV LAB;  Service: Cardiovascular;  Laterality: N/A;  . LEFT HEART CATHETERIZATION WITH CORONARY ANGIOGRAM N/A 07/03/2014   Procedure: LEFT HEART CATHETERIZATION WITH CORONARY ANGIOGRAM;  Surgeon: Belva Crome, MD;  Location: [facility] CATH LAB;  Service:  Cardiovascular;  Laterality: N/A;  . POLYPECTOMY  01/05/2017   Procedure: POLYPECTOMY;  Surgeon: Rogene Houston, MD;  [facility];  Service: Endoscopy;;  colon  . SHOULDER SURGERY Left      Family History  Problem Relation Age of Onset  . Colon cancer Paternal Grandfather   . Cancer Father   . Cancer Maternal Uncle   . Alzheimer's disease Paternal Aunt   . Cirrhosis Maternal Uncle   . Cancer Paternal Aunt        lung  . Aneurysm Mother        brain  . Heart disease Brother   . Heart attack Brother   . Mental illness Daughter   . ADD / ADHD Daughter   . Bipolar disorder Daughter   . Mental illness Son   . ADD / ADHD Son   . Bipolar disorder Son   . Mental illness Son   . ADD / ADHD Son   . Bipolar disorder Son     Social History   Social History Narrative   Patient lives at home with her husband.On disability since age 50 yrs.Currently living in a hotel,looking for permanent residence.   Social History   Tobacco Use  . Smoking status: Former Smoker    Packs/day: 0.20    Years: 32.00    Pack years: 6.40    Types: Cigarettes, Cigars    Start date: 07/05/1983    Quit date: 04/03/2012    Years since quitting: 7.4  . Smokeless tobacco: Never Used  Substance Use Topics  . Alcohol use: No    Alcohol/week: 0.0 standard drinks    Current Meds  Medication Sig  . ALPRAZolam (XANAX) 0.5 MG tablet Take 1 tablet (0.5 mg total) by mouth every morning.  [name] amLODipine (NORVASC) 10 MG tablet Take 1 tablet (10 mg total) by mouth daily.  [name] aspirin EC 81 MG tablet Take 162 mg by mouth 2 (two) times daily.  [name] atorvastatin (LIPITOR) 40 MG tablet Take 1 tablet (40 mg total) by mouth daily at 6 PM.  . Cholecalciferol (VITAMIN D3) 25 MCG (1000 UT) CHEW Chew 2,000 Units by mouth daily.  . clopidogrel (PLAVIX) 75 MG tablet Take 75 mg by mouth daily.   [name] escitalopram (LEXAPRO) 20 MG tablet Take 20 mg by mouth daily.   . fluticasone (FLONASE) 50 MCG/ACT nasal spray Place 1  spray into both nostrils daily.  . Fluticasone-Salmeterol (ADVAIR DISKUS) 100-50 MCG/DOSE AEPB Inhale 1 puff into the lungs 2 (two) times daily.  . isosorbide mononitrate (IMDUR) 30 MG 24 hr tablet Take 3 tablets (90 mg total) by mouth daily.  [name] LINZESS 145 MCG CAPS capsule Take 145 mcg by mouth daily.   [name] losartan (COZAAR) 100 MG  tablet Take 1 tablet (100 mg total) by mouth daily.  . Medical ID Bracelet MISC 1 Units by Does not apply route daily.  . metoprolol tartrate (LOPRESSOR) 50 MG tablet Take 1.5 tablets (75 mg total) by mouth 2 (two) times daily.  . nitroGLYCERIN (NITROSTAT) 0.4 MG SL tablet Place 1 tablet (0.4 mg total) under the tongue every 5 (five) minutes as needed for chest pain.  . NONFORMULARY OR COMPOUNDED ITEM Inject 5 mg into the muscle every 7 (seven) days. Testosterone cypionate in olive  oil (25 mg/mL).  Dispense 5 mL vial.  . potassium chloride 20 MEQ/15ML (10%) SOLN Take 15 mLs by mouth daily.  . predniSONE (DELTASONE) 20 MG tablet Take 2 tablets (40 mg total) by mouth daily with breakfast.  . Testosterone Propionate (FIRST-TESTOSTERONE MC) 2 % CREA Place 5 mg onto the skin daily.  [name] thyroid (NP THYROID) 120 MG tablet Take 1 tablet (120 mg total) by mouth daily before breakfast.   Current Facility-Administered Medications for the 09/11/19 encounter (Office Visit) with [name], MD  Medication  . NONFORMULARY OR COMPOUNDED ITEM 0.2 mL       Depression screen [facility] 2/9 08/15/2019 07/02/2019 03/14/2017  Decreased Interest 3 0 3  Down, Depressed, Hopeless 1 0 0  PHQ - 2 Score 4 0 3  Altered sleeping 1 - 3  Tired, decreased energy 3 - 3  Change in appetite 3 - 3  Feeling bad or failure about yourself  3 - 0  Trouble concentrating 1 - 0  Moving slowly or fidgety/restless 0 - 0  Suicidal thoughts 0 - 0  PHQ-9 Score 15 - 12  Difficult doing work/chores Very difficult - Very difficult  Some recent data might be hidden     Objective:   Today's Vitals: BP (!)  150/70 (BP Location: Right Arm, Patient Position: Sitting, Cuff Size: Normal)   Pulse 66   Temp 98.3 F (36.8 C) (Temporal)   Resp 20   Ht 5\' 6"  (1.676 m)   Wt 166 lb 3.2 oz (75.4 kg)   SpO2 99%   BMI 26.83 kg/m  Vitals with BMI 09/11/2019 09/04/2019 09/04/2019  Height 5\' 6"  5\' 6"  -  Weight 166 lbs 3 oz 164 lbs 6 oz -  BMI 0000000 123XX123 -  Systolic Q000111Q 123456 123456  Diastolic 70 86 86  Pulse 66 78 78     Physical Exam   She looks systemically well.  She is in no respiratory distress.  Lung fields are entirely clear with no evidence of wheezing especially on forced expiration.  I cannot hear any crackles, bronchial breathing or pleural rub.    Assessment   1. Cough       Tests ordered No orders of the defined types were placed in this encounter.    Plan: 1. I think the cough is persistent from recovered very from the infection due to some persistent inflammation.  We will treat her symptomatically with Robitussin with codeine which hopefully will help her.  If she does not improve in the next 1 to 2 weeks, she will follow-up again. 2. Otherwise I will see her in about 6 weeks time for follow-up.   Meds ordered this encounter  Medications  . guaiFENesin-codeine (ROBITUSSIN AC) 100-10 MG/5ML syrup    Sig: Take 5 mLs by mouth 3 (three) times daily as needed for cough.    Dispense:  120 mL    Refill:  0    Dayquan Buys Luther Parody, MD

## 2019-09-18 ENCOUNTER — Other Ambulatory Visit: Payer: Self-pay

## 2019-09-18 ENCOUNTER — Ambulatory Visit (INDEPENDENT_AMBULATORY_CARE_PROVIDER_SITE_OTHER): Payer: Medicare Other

## 2019-09-18 DIAGNOSIS — R6882 Decreased libido: Secondary | ICD-10-CM

## 2019-09-18 NOTE — Progress Notes (Signed)
Pt was given 0.22mL injected to the Lrt thigh. Pt at tolerated.

## 2019-09-25 ENCOUNTER — Other Ambulatory Visit: Payer: Self-pay

## 2019-09-25 ENCOUNTER — Ambulatory Visit (INDEPENDENT_AMBULATORY_CARE_PROVIDER_SITE_OTHER): Payer: Medicare Other

## 2019-09-25 DIAGNOSIS — R6882 Decreased libido: Secondary | ICD-10-CM

## 2019-09-25 NOTE — Progress Notes (Signed)
Pt was given 0.69ml to the left thigh. Pt tolerated well.

## 2019-10-02 ENCOUNTER — Ambulatory Visit (INDEPENDENT_AMBULATORY_CARE_PROVIDER_SITE_OTHER): Payer: Medicare Other

## 2019-10-02 ENCOUNTER — Other Ambulatory Visit: Payer: Self-pay

## 2019-10-02 VITALS — HR 90 | Temp 97.1°F

## 2019-10-02 DIAGNOSIS — R6882 Decreased libido: Secondary | ICD-10-CM

## 2019-10-02 NOTE — Progress Notes (Signed)
Pt given 0.24mL IM to the left thigh. Pt tolerated well. No complaints. Applied bandage.

## 2019-10-09 ENCOUNTER — Ambulatory Visit (INDEPENDENT_AMBULATORY_CARE_PROVIDER_SITE_OTHER): Payer: Medicare Other

## 2019-10-10 ENCOUNTER — Ambulatory Visit (INDEPENDENT_AMBULATORY_CARE_PROVIDER_SITE_OTHER): Payer: Medicare Other | Admitting: Internal Medicine

## 2019-10-16 ENCOUNTER — Ambulatory Visit (INDEPENDENT_AMBULATORY_CARE_PROVIDER_SITE_OTHER): Payer: Medicare Other

## 2019-10-16 ENCOUNTER — Other Ambulatory Visit: Payer: Self-pay

## 2019-10-16 DIAGNOSIS — R6882 Decreased libido: Secondary | ICD-10-CM

## 2019-10-16 NOTE — Progress Notes (Signed)
Pt was 0.70mL to the left thigh. Pt tolerated well;no tolerated well.

## 2019-10-17 ENCOUNTER — Encounter (INDEPENDENT_AMBULATORY_CARE_PROVIDER_SITE_OTHER): Payer: Self-pay | Admitting: Gastroenterology

## 2019-10-17 ENCOUNTER — Telehealth (INDEPENDENT_AMBULATORY_CARE_PROVIDER_SITE_OTHER): Payer: Self-pay | Admitting: *Deleted

## 2019-10-17 ENCOUNTER — Ambulatory Visit (INDEPENDENT_AMBULATORY_CARE_PROVIDER_SITE_OTHER): Payer: Medicare Other | Admitting: Gastroenterology

## 2019-10-17 ENCOUNTER — Encounter (INDEPENDENT_AMBULATORY_CARE_PROVIDER_SITE_OTHER): Payer: Self-pay | Admitting: *Deleted

## 2019-10-17 VITALS — BP 147/93 | HR 71 | Temp 99.0°F | Ht 64.0 in | Wt 162.1 lb

## 2019-10-17 DIAGNOSIS — R110 Nausea: Secondary | ICD-10-CM | POA: Diagnosis not present

## 2019-10-17 DIAGNOSIS — K219 Gastro-esophageal reflux disease without esophagitis: Secondary | ICD-10-CM

## 2019-10-17 DIAGNOSIS — G8929 Other chronic pain: Secondary | ICD-10-CM | POA: Diagnosis not present

## 2019-10-17 DIAGNOSIS — R1013 Epigastric pain: Secondary | ICD-10-CM

## 2019-10-17 MED ORDER — PANTOPRAZOLE SODIUM 40 MG PO TBEC: 40.0000 mg | DELAYED_RELEASE_TABLET | Freq: Two times a day (BID) | ORAL | 3 refills | Status: DC

## 2019-10-17 NOTE — Telephone Encounter (Signed)
Please advise if OK to hold

## 2019-10-17 NOTE — Telephone Encounter (Signed)
Patient aware.

## 2019-10-17 NOTE — Patient Instructions (Addendum)
We are scheduling endoscopy for evaluation and starting protonix. Take protonix 30 min before meals

## 2019-10-17 NOTE — Telephone Encounter (Signed)
Yes.  Her cardiac catheterization in July of last year showed stable anatomy, she has not had any DES interventions in the last year.  Reasonable to stop antiplatelet therapy temporarily for endoscopy in case she needs any biopsies.

## 2019-10-17 NOTE — Progress Notes (Signed)
Patient profile: Bethany Wang is a 54 y.o. female seen for evaluation of nausea/epigastric pain.  History of Present Illness: Bethany Wang is seen today and reports epigastric pain for "a while"-approximately 6 months, reports pain daily described a burning pain that radiates around whole abd. Patient states she eats 1 meal a day-Eating can make epigastric pain worse, states she avoids eating at times due to worsening pain.. She reports that her medications can cause n/v.  Processed foods seem to be worst for epigastric pain and can trigger vomiting.    She reports tolerates dinner meal without vomiting but seems to have vomiting mostly after breakfast and lunch. She denies any GERD symptoms. Chronic pill dysphagia which has been evaluated by ENT in past   She feels her cardiac medication is the etiology of her n/v   She reports 4-5 small bowel movements a day w/ linzess. She denies any blood in stool or lower abd pain.     She finished a course of prednisone for knee pain last month. Asa 81mg  but denies any other NSAIDS. Non smoker and no alcohol. Has had a lot of stress caring for 3 grandchildren, separating from her husband.   Wt Readings from Last 3 Encounters:  10/17/19 162 lb 1.6 oz (73.5 kg)  09/11/19 166 lb 3.2 oz (75.4 kg)  09/04/19 164 lb 6.4 oz (74.6 kg)     Last Colonoscopy: 2018--Six diminutive polyps in the sigmoid colon, at the splenic flexure and in the ascending colon. Biopsied. - External hemorrhoids. Patient had 6 small polyps removed and only one is tubular adenoma. Results reviewed with patient. Next colonoscopy in 7 years Last Endoscopy:    Past Medical History:  Past Medical History:  Diagnosis Date  . Anemia   . Anxiety   . Bilateral breast cysts 12/30/2015  . Brain tumor ([place])   . Breast cancer ([place])    Left Breast Cancer  . Breast disorder   . BV (bacterial vaginosis) 09/09/2015  . Coronary atherosclerosis of native coronary artery    a. s/p  DES to RCA in 2013 b. DES to LAD in 2014 c. cath in 04/2016 showing patent LAD stent with D2 jailed by LAD stent and CTO of RCA with left to right collaterals present  . Cyst of pharynx or nasopharynx    [name]  . Decreased libido 12/25/2018  . Depression   . Essential hypertension   . Falls    x 3-4 in past year  . GERD (gastroesophageal reflux disease)   . Headache   . History of hematuria   . Mixed hyperlipidemia   . Personal history of chemotherapy    Left Breast Cancer  . Personal history of radiation therapy    Left Breast Cancer  . Pre-diabetes   . PUD (peptic ulcer disease)   . Seizures ([place])   . Shingles 09/09/2015  . Stroke ([place])    12-2017 on Plavix, only deficit is headaches  . Suicide attempt ([place])    3 attempts in remote past  . Trichimoniasis 08/20/2019   Treated 08/20/19   . Uterine cancer ([place])   . Vitamin D deficiency disease 04/03/2019    Problem List: Patient Active Problem List   Diagnosis Date Noted  . Trichimoniasis 08/20/2019  . Screening examination for STD (sexually transmitted disease) 08/15/2019  . Vaginal itching 08/15/2019  . Hypertension 08/15/2019  . Vitamin D deficiency disease 04/03/2019  . Decreased libido 12/25/2018  . Angina pectoris ([place]) 10/25/2018  .  Palpitations 06/06/2018  . Chest pain 07/13/2017  . Nonspecific chest pain 07/13/2017  . Mixed hyperlipidemia 07/13/2017  . Precordial chest pain   . Hypertensive urgency 06/15/2016  . Unstable angina pectoris ([place]) 06/15/2016  . Unstable angina ([place]) 06/15/2016  . Bilateral breast cysts 12/30/2015  . Vaginal discharge 09/09/2015  . BV (bacterial vaginosis) 09/09/2015  . Shingles 09/09/2015  . History of breast cancer 09/09/2015  . Syncope and collapse 08/08/2015  . Migraine with aura and with status migrainosus, not intractable 08/08/2015  . Chronic low back pain 08/08/2015  . Insomnia 08/08/2015  . Anxiety state 08/08/2015  . Panic attacks 08/08/2015  .  Anemia, iron deficiency 08/08/2015  . Pica 08/08/2015  . Angina decubitus ([place]) 07/03/2014  . 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 Encounter for gynecological examination with Papanicolaou smear of cervix 02/04/2014  . Syncope 12/14/2013  . Cervical disc disorder with radiculopathy of cervical region 02/19/2013  . H/O rotator cuff surgery 02/19/2013  . Dyspareunia 02/01/2013  . Left shoulder pain 01/24/2013  . Rotator cuff tear 01/24/2013  . Radicular pain 01/24/2013  . Labral tear of shoulder 01/24/2013  . Complex regional pain syndrome of upper extremity 01/24/2013  . Herniated disc, cervical 01/24/2013  . Nausea and vomiting 01/18/2013  . Rectal bleeding 01/18/2013  . Edema of left foot 10/16/2012  . Coronary atherosclerosis of native coronary artery   . Essential hypertension, benign   . HLD (hyperlipidemia)   . Tobacco abuse     Past Surgical History: Past Surgical History:  Procedure Laterality Date  . ABDOMINAL HYSTERECTOMY    . BLADDER SURGERY    . BREAST LUMPECTOMY Left   . BREAST LUMPECTOMY Right 02/24/2018   Procedure: RIGHT BREAST LUMPECTOMY ERAS PATHWAY;  Surgeon: Jovita Kussmaul, MD;  Location: [facility];  Service: General;  Laterality: Right;  . CARDIAC CATHETERIZATION N/A 05/10/2016   Procedure: Left Heart Cath and Coronary Angiography;  Surgeon: Belva Crome, MD;  Location: [facility];  Service: Cardiovascular;  Laterality: N/A;  . COLONOSCOPY  May 2010   Fleishman: normal rectum, internal hemorrhoids, , benign colonic polyp  . COLONOSCOPY N/A 01/05/2017   Procedure: COLONOSCOPY;  Surgeon: Rogene Houston, MD;  [facility];  Service: Endoscopy;  Laterality: N/A;  1:00  . ESOPHAGOGASTRODUODENOSCOPY     2001 Dr. Amedeo Plenty: distal esophagitis, small hiatal hernia,. Dr. Tamala Julian 2006? no records available currently, pt also reports  EGD a few years ago with Dr. Gala Romney, do not have these reports anywhere in medical records  .  ESOPHAGOGASTRODUODENOSCOPY  06/02/2011   JXB:JYNWGN pill impaction as described above s/p dilation of a probable cervical esophageal web/bx abnormal esophageal and gastric mucosa. + H.pylori gastritis   . EYE SURGERY     Removed glass  . HAND SURGERY Right   . Left breast lumpectomy     Benign  . LEFT HEART CATH AND CORONARY ANGIOGRAPHY N/A 10/25/2018   Procedure: LEFT HEART CATH AND CORONARY ANGIOGRAPHY;  Surgeon: Wellington Hampshire, MD;  Location: [facility];  Service: Cardiovascular;  Laterality: N/A;  . LEFT HEART CATHETERIZATION WITH CORONARY ANGIOGRAM N/A 07/03/2014   Procedure: LEFT HEART CATHETERIZATION WITH CORONARY ANGIOGRAM;  Surgeon: Belva Crome, MD;  Location: [facility] CATH [facility];  Service: Cardiovascular;  Laterality: N/A;  . POLYPECTOMY  01/05/2017   Procedure: POLYPECTOMY;  Surgeon: Rogene Houston, MD;  [facility];  Service: Endoscopy;;  colon  . SHOULDER SURGERY Left     Allergies:  Allergies  Allergen Reactions  . Bee Venom Anaphylaxis and Other (See Comments)    "throat swelled and had to the hospital" - Yellow jacket sting  . Penicillins Anaphylaxis and Other (See Comments)    Has patient had a PCN reaction causing immediate rash, facial/tongue/throat swelling, SOB or lightheadedness with hypotension: Yes Has patient had a PCN reaction causing severe rash involving mucus membranes or skin necrosis: Yes Has patient had a PCN reaction that required hospitalization Yes Has patient had a PCN reaction occurring within the last 10 years: Yes If all of the above answers are "NO", then may proceed with Cephalosporin use. Patient states she carries an epi-pen  . Shellfish Allergy Swelling      Home Medications:  Current Outpatient Medications:  .  ALPRAZolam (XANAX) 0.5 MG tablet, Take 1 tablet (0.5 mg total) by mouth every morning., Disp: 30 tablet, Rfl: 0 .  amLODipine (NORVASC) 10 MG tablet, Take 1 tablet (10 mg total) by mouth daily., Disp: 30 tablet,  Rfl: 6 .  aspirin EC 81 MG tablet, Take 162 mg by mouth 2 (two) times daily., Disp: , Rfl:  .  atorvastatin (LIPITOR) 40 MG tablet, Take 1 tablet (40 mg total) by mouth daily at 6 PM., Disp: 30 tablet, Rfl: 0 .  Cholecalciferol (VITAMIN D3) 25 MCG (1000 UT) CHEW, Chew 2,000 Units by mouth daily., Disp: , Rfl:  .  clopidogrel (PLAVIX) 75 MG tablet, Take 75 mg by mouth daily. , Disp: , Rfl:  .  escitalopram (LEXAPRO) 20 MG tablet, Take 20 mg by mouth daily. , Disp: , Rfl:  .  fluticasone (FLONASE) 50 MCG/ACT nasal spray, Place 1 spray into both nostrils daily., Disp: 9.9 mL, Rfl: 0 .  Fluticasone-Salmeterol (ADVAIR DISKUS) 100-50 MCG/DOSE AEPB, Inhale 1 puff into the lungs 2 (two) times daily., Disp: 60 each, Rfl: 3 .  isosorbide mononitrate (IMDUR) 30 MG 24 hr tablet, Take 3 tablets (90 mg total) by mouth daily., Disp:  , Rfl:  .  Light Mineral Oil-Mineral Oil (RETAINE MGD OP), Apply to eye. Patient applies 1 drop in the morning , lunch time and at bedtime., Disp: , Rfl:  .  LINZESS 145 MCG CAPS capsule, Take 145 mcg by mouth daily. , Disp: , Rfl:  .  losartan (COZAAR) 100 MG tablet, Take 1 tablet (100 mg total) by mouth daily., Disp: , Rfl:  .  Medical ID Bracelet MISC, 1 Units by Does not apply route daily., Disp: 1 each, Rfl: o .  metoprolol tartrate (LOPRESSOR) 50 MG tablet, Take 1.5 tablets (75 mg total) by mouth 2 (two) times daily., Disp: 90 tablet, Rfl: 6 .  nitroGLYCERIN (NITROSTAT) 0.4 MG SL tablet, Place 1 tablet (0.4 mg total) under the tongue every 5 (five) minutes as needed for chest pain., Disp: 25 tablet, Rfl: 3 .  NONFORMULARY OR COMPOUNDED ITEM, Inject 5 mg into the muscle every 7 (seven) days. Testosterone cypionate in olive  oil (25 mg/mL).  Dispense 5 mL vial., Disp: 1 each, Rfl: 3 .  potassium chloride 20 MEQ/15ML (10%) SOLN, Take 15 mLs by mouth daily., Disp: , Rfl:  .  thyroid (NP THYROID) 120 MG tablet, Take 1 tablet (120 mg total) by mouth daily before breakfast., Disp: 30  tablet, Rfl: 3 .  metroNIDAZOLE (FLAGYL) 500 MG tablet, Take 1 tablet (500 mg total) by mouth 3 (three) times daily. (Patient not taking: Reported on 09/11/2019), Disp: 21 tablet, Rfl: 0 .  pantoprazole (PROTONIX) 40 MG tablet, Take 1  tablet (40 mg total) by mouth 2 (two) times daily before a meal., Disp: 60 tablet, Rfl: 3 .  predniSONE (DELTASONE) 20 MG tablet, Take 2 tablets (40 mg total) by mouth daily with breakfast. (Patient not taking: Reported on 10/17/2019), Disp: 10 tablet, Rfl: 1 .  Testosterone Propionate (FIRST-TESTOSTERONE MC) 2 % CREA, Place 5 mg onto the skin daily. (Patient not taking: Reported on 10/17/2019), Disp: 30 g, Rfl: 0  Current Facility-Administered Medications:  .  NONFORMULARY OR COMPOUNDED ITEM 0.2 mL, 0.2 mL, Intramuscular, Q7 days, Gosrani, Nimish C, MD, 0.2 mL at 10/16/19 1022   Family History: family history includes ADD / ADHD in her daughter, son, and son; Alzheimer's disease in her paternal aunt; Aneurysm in her mother; Bipolar disorder in her daughter, son, and son; Cancer in her father, maternal uncle, and paternal aunt; Cirrhosis in her maternal uncle; Colon cancer in her paternal grandfather; Heart attack in her brother; Heart disease in her brother; Mental illness in her daughter, son, and son.    Social History:   reports that she quit smoking about 7 years ago. Her smoking use included cigarettes and cigars. She started smoking about 36 years ago. She has a 6.40 pack-year smoking history. She has never used smokeless tobacco. She reports that she does not drink alcohol and does not use drugs.   Review of Systems: Constitutional: Denies weight loss/weight gain  Eyes: No changes in vision. ENT: No oral lesions, sore throat.  GI: see HPI.  Heme/Lymph: No easy bruising.  CV: No chest pain.  GU: No hematuria.  Integumentary: No rashes.  Neuro: No headaches.  Psych: No depression/anxiety.  Endocrine: No heat/cold intolerance.  Allergic/Immunologic: No  urticaria.  Resp: No cough, SOB.  Musculoskeletal: No joint swelling.    Physical Examination: BP (!) 147/93 (BP Location: Right Arm, Patient Position: Sitting, Cuff Size: Normal)   Pulse 71   Temp 99 F (37.2 C) (Oral)   Ht 5\' 4"  (1.626 m)   Wt 162 lb 1.6 oz (73.5 kg)   BMI 27.82 kg/m  Gen: NAD, alert and oriented x 4 HEENT: PEERLA, EOMI, Neck: supple, no JVD Chest: CTA bilaterally, no wheezes, crackles, or other adventitious sounds CV: RRR, no m/g/c/r Abd: soft, NT, ND, +BS in all four quadrants; no HSM, guarding, ridigity, or rebound tenderness Ext: no edema, well perfused with 2+ pulses, Skin: no rash or lesions noted on observed skin Lymph: no noted LAD  Data:  06/2018---CBC normal, TSH 6.12, free T3 3.7, CMP normal.    Assessment/Plan: Ms. Jaskiewicz is a 54 y.o. female  Sherrilyn was seen today for new patient (initial visit).  Diagnoses and all orders for this visit:  Abdominal pain, chronic, epigastric  Gastroesophageal reflux disease, unspecified whether esophagitis present  Nausea without vomiting  Other orders -     pantoprazole (PROTONIX) 40 MG tablet; Take 1 tablet (40 mg total) by mouth 2 (two) times daily before a meal.     1.  Nausea/GERD-we will schedule an upper endoscopy for initial evaluation.  Her labs as above were fairly unremarkable except elevated TSH.  We will also start her on a PPI in the interim.  If EGD is unremarkable would consider abdominal imaging.  She is up-to-date on colonoscopy.  Patient denies CP, SOB. I discussed the risks and benefits of procedure including bleeding, perforation, infection, missed lesions, medication reactions and possible hospitalization or surgery if complications. All questions answered.  Given chronic comorbidities plan for propofol.  PLAVIX - will verify  w/ cardiology can hold prior to procedure.    I personally performed the service, non-incident to. (WP)  Laurine Blazer, [facility]

## 2019-10-17 NOTE — Telephone Encounter (Signed)
Patient is scheduled for endoscopy 8/18 and needs to stop Plavix 5 days and ASA 2 days prior - please advise if ok to stop, thanks

## 2019-10-23 ENCOUNTER — Encounter (INDEPENDENT_AMBULATORY_CARE_PROVIDER_SITE_OTHER): Payer: Self-pay

## 2019-10-23 ENCOUNTER — Ambulatory Visit (INDEPENDENT_AMBULATORY_CARE_PROVIDER_SITE_OTHER): Payer: Medicare Other

## 2019-10-23 ENCOUNTER — Other Ambulatory Visit: Payer: Self-pay

## 2019-10-23 VITALS — BP 140/80 | HR 89 | Temp 97.3°F | Resp 18 | Ht 64.0 in | Wt 160.0 lb

## 2019-10-23 DIAGNOSIS — R6882 Decreased libido: Secondary | ICD-10-CM

## 2019-10-23 NOTE — Progress Notes (Signed)
Pt was given testosterone 0.59mL to the right thigh. Pt tolerated well. No complaints.

## 2019-10-29 ENCOUNTER — Other Ambulatory Visit: Payer: Self-pay

## 2019-10-29 ENCOUNTER — Encounter (INDEPENDENT_AMBULATORY_CARE_PROVIDER_SITE_OTHER): Payer: Self-pay | Admitting: Internal Medicine

## 2019-10-29 ENCOUNTER — Ambulatory Visit (INDEPENDENT_AMBULATORY_CARE_PROVIDER_SITE_OTHER): Payer: Medicare Other | Admitting: Internal Medicine

## 2019-10-29 VITALS — BP 150/90 | HR 78 | Temp 97.9°F | Resp 18 | Ht 64.0 in | Wt 164.4 lb

## 2019-10-29 DIAGNOSIS — I25119 Atherosclerotic heart disease of native coronary artery with unspecified angina pectoris: Secondary | ICD-10-CM

## 2019-10-29 DIAGNOSIS — E559 Vitamin D deficiency, unspecified: Secondary | ICD-10-CM | POA: Diagnosis not present

## 2019-10-29 DIAGNOSIS — L989 Disorder of the skin and subcutaneous tissue, unspecified: Secondary | ICD-10-CM | POA: Diagnosis not present

## 2019-10-29 DIAGNOSIS — I10 Essential (primary) hypertension: Secondary | ICD-10-CM | POA: Diagnosis not present

## 2019-10-29 DIAGNOSIS — E119 Type 2 diabetes mellitus without complications: Secondary | ICD-10-CM

## 2019-10-29 DIAGNOSIS — F411 Generalized anxiety disorder: Secondary | ICD-10-CM | POA: Diagnosis not present

## 2019-10-29 DIAGNOSIS — E782 Mixed hyperlipidemia: Secondary | ICD-10-CM

## 2019-10-29 NOTE — Progress Notes (Signed)
Metrics: Intervention Frequency ACO  Documented Smoking Status Yearly  Screened one or more times in 24 months  Cessation Counseling or  Active cessation medication Past 24 months  Past 24 months   Guideline developer: UpToDate (See UpToDate for funding source) Date Released: 2014       Wellness Office Visit  Subjective:  Patient ID: Bethany Wang, female    DOB: [birth year]  Age: 54 y.o. MRN: [id number]  CC: This lady comes in for follow-up of hypertension, hyperlipidemia, coronary artery disease, vitamin D deficiency and anxiety. HPI  Overall she is doing reasonably well.  She has upcoming EGD.  She also has skin lesions on her arms and would like to see a dermatologist. She denies any chest pain currently. She continues on medication as she was before.  No new changes.  She has seen ENT and there are some additional medications for sinusitis. Past Medical History:  Diagnosis Date  . Anemia   . Anxiety   . Bilateral breast cysts 12/30/2015  . Brain tumor ([place])   . Breast cancer ([place])    Left Breast Cancer  . Breast disorder   . BV (bacterial vaginosis) 09/09/2015  . Coronary atherosclerosis of native coronary artery    a. s/p DES to RCA in 2013 b. DES to LAD in 2014 c. cath in 04/2016 showing patent LAD stent with D2 jailed by LAD stent and CTO of RCA with left to right collaterals present  . Cyst of pharynx or nasopharynx    [name]  . Decreased libido 12/25/2018  . Depression   . Essential hypertension   . Falls    x 3-4 in past year  . GERD (gastroesophageal reflux disease)   . Headache   . History of hematuria   . Mixed hyperlipidemia   . Personal history of chemotherapy    Left Breast Cancer  . Personal history of radiation therapy    Left Breast Cancer  . Pre-diabetes   . PUD (peptic ulcer disease)   . Seizures ([place])   . Shingles 09/09/2015  . Stroke ([place])    12-2017 on Plavix, only deficit is headaches  . Suicide attempt ([place])    3  attempts in remote past  . Trichimoniasis 08/20/2019   Treated 08/20/19   . Uterine cancer ([place])   . Vitamin D deficiency disease 04/03/2019   Past Surgical History:  Procedure Laterality Date  . ABDOMINAL HYSTERECTOMY    . BLADDER SURGERY    . BREAST LUMPECTOMY Left   . BREAST LUMPECTOMY Right 02/24/2018   Procedure: RIGHT BREAST LUMPECTOMY ERAS PATHWAY;  Surgeon: Jovita Kussmaul, MD;  Location: [facility];  Service: General;  Laterality: Right;  . CARDIAC CATHETERIZATION N/A 05/10/2016   Procedure: Left Heart Cath and Coronary Angiography;  Surgeon: Belva Crome, MD;  Location: [facility];  Service: Cardiovascular;  Laterality: N/A;  . COLONOSCOPY  May 2010   Fleishman: normal rectum, internal hemorrhoids, , benign colonic polyp  . COLONOSCOPY N/A 01/05/2017   Procedure: COLONOSCOPY;  Surgeon: Rogene Houston, MD;  [facility];  Service: Endoscopy;  Laterality: N/A;  1:00  . ESOPHAGOGASTRODUODENOSCOPY     2001 Dr. Amedeo Plenty: distal esophagitis, small hiatal hernia,. Dr. Tamala Julian 2006? no records available currently, pt also reports  EGD a few years ago with Dr. Gala Romney, do not have these reports anywhere in medical records  . ESOPHAGOGASTRODUODENOSCOPY  06/02/2011   NAT:FTDDUK pill impaction as described above s/p dilation of a  probable cervical esophageal web/bx abnormal esophageal and gastric mucosa. + H.pylori gastritis   . EYE SURGERY     Removed glass  . HAND SURGERY Right   . Left breast lumpectomy     Benign  . LEFT HEART CATH AND CORONARY ANGIOGRAPHY N/A 10/25/2018   Procedure: LEFT HEART CATH AND CORONARY ANGIOGRAPHY;  Surgeon: Wellington Hampshire, MD;  Location: [facility];  Service: Cardiovascular;  Laterality: N/A;  . LEFT HEART CATHETERIZATION WITH CORONARY ANGIOGRAM N/A 07/03/2014   Procedure: LEFT HEART CATHETERIZATION WITH CORONARY ANGIOGRAM;  Surgeon: Belva Crome, MD;  Location: [facility] CATH LAB;  Service: Cardiovascular;  Laterality: N/A;  . POLYPECTOMY   01/05/2017   Procedure: POLYPECTOMY;  Surgeon: Rogene Houston, MD;  [facility];  Service: Endoscopy;;  colon  . SHOULDER SURGERY Left      Family History  Problem Relation Age of Onset  . Colon cancer Paternal Grandfather   . Cancer Father   . Cancer Maternal Uncle   . Alzheimer's disease Paternal Aunt   . Cirrhosis Maternal Uncle   . Cancer Paternal Aunt        lung  . Aneurysm Mother        brain  . Heart disease Brother   . Heart attack Brother   . Mental illness Daughter   . ADD / ADHD Daughter   . Bipolar disorder Daughter   . Mental illness Son   . ADD / ADHD Son   . Bipolar disorder Son   . Mental illness Son   . ADD / ADHD Son   . Bipolar disorder Son     Social History   Social History Narrative   Patient lives at home with her husband but in the process of being separated now.On disability since age 78 yrs.   Social History   Tobacco Use  . Smoking status: Former Smoker    Packs/day: 0.20    Years: 32.00    Pack years: 6.40    Types: Cigarettes, Cigars    Start date: 07/05/1983    Quit date: 04/03/2012    Years since quitting: 7.5  . Smokeless tobacco: Never Used  Substance Use Topics  . Alcohol use: No    Alcohol/week: 0.0 standard drinks    Current Meds  Medication Sig  . ALPRAZolam (XANAX) 0.5 MG tablet Take 1 tablet (0.5 mg total) by mouth every morning.  [name] amLODipine (NORVASC) 10 MG tablet Take 1 tablet (10 mg total) by mouth daily.  [name] aspirin EC 81 MG tablet Take 162 mg by mouth 2 (two) times daily.  [name] atorvastatin (LIPITOR) 40 MG tablet Take 1 tablet (40 mg total) by mouth daily at 6 PM.  . Cholecalciferol (VITAMIN D3) 25 MCG (1000 UT) CHEW Chew 2,000 Units by mouth daily.  . clopidogrel (PLAVIX) 75 MG tablet Take 75 mg by mouth daily.   [name] escitalopram (LEXAPRO) 20 MG tablet Take 20 mg by mouth daily.   . fluticasone (FLONASE) 50 MCG/ACT nasal spray Place 1 spray into both nostrils daily.  . Fluticasone-Salmeterol (ADVAIR  DISKUS) 100-50 MCG/DOSE AEPB Inhale 1 puff into the lungs 2 (two) times daily.  . isosorbide mononitrate (IMDUR) 30 MG 24 hr tablet Take 3 tablets (90 mg total) by mouth daily.  Sunday Corn Mineral Oil-Mineral Oil (RETAINE MGD OP) Apply to eye. Patient applies 1 drop in the morning , lunch time and at bedtime.  [name] LINZESS 145 MCG CAPS capsule Take 145 mcg by mouth  daily.   . losartan (COZAAR) 100 MG tablet Take 1 tablet (100 mg total) by mouth daily.  . Medical ID Bracelet MISC 1 Units by Does not apply route daily.  . metoprolol tartrate (LOPRESSOR) 50 MG tablet Take 1.5 tablets (75 mg total) by mouth 2 (two) times daily.  . nitroGLYCERIN (NITROSTAT) 0.4 MG SL tablet Place 1 tablet (0.4 mg total) under the tongue every 5 (five) minutes as needed for chest pain.  . NONFORMULARY OR COMPOUNDED ITEM Inject 5 mg into the muscle every 7 (seven) days. Testosterone cypionate in olive  oil (25 mg/mL).  Dispense 5 mL vial.  . pantoprazole (PROTONIX) 40 MG tablet Take 1 tablet (40 mg total) by mouth 2 (two) times daily before a meal.  . potassium chloride 20 MEQ/15ML (10%) SOLN Take 15 mLs by mouth daily.  . prednisoLONE acetate (PRED FORTE) 1 % ophthalmic suspension   . Testosterone Propionate (FIRST-TESTOSTERONE MC) 2 % CREA Place 5 mg onto the skin daily.  [name] thyroid (NP THYROID) 120 MG tablet Take 1 tablet (120 mg total) by mouth daily before breakfast.  . [DISCONTINUED] metroNIDAZOLE (FLAGYL) 500 MG tablet Take 1 tablet (500 mg total) by mouth 3 (three) times daily.  . [DISCONTINUED] predniSONE (DELTASONE) 20 MG tablet Take 2 tablets (40 mg total) by mouth daily with breakfast.   Current Facility-Administered Medications for the 10/29/19 encounter (Office Visit) with [name], MD  Medication  . NONFORMULARY OR COMPOUNDED ITEM 0.2 mL      Depression screen [facility] 2/9 08/15/2019 07/02/2019 03/14/2017  Decreased Interest 3 0 3  Down, Depressed, Hopeless 1 0 0  PHQ - 2 Score 4 0 3  Altered sleeping 1 -  3  Tired, decreased energy 3 - 3  Change in appetite 3 - 3  Feeling bad or failure about yourself  3 - 0  Trouble concentrating 1 - 0  Moving slowly or fidgety/restless 0 - 0  Suicidal thoughts 0 - 0  PHQ-9 Score 15 - 12  Difficult doing work/chores Very difficult - Very difficult  Some recent data might be hidden     Objective:   Today's Vitals: BP (!) 150/90 (BP Location: Right Arm, Patient Position: Sitting, Cuff Size: Normal)   Pulse 78   Temp 97.9 F (36.6 C) (Temporal)   Resp 18   Ht 5\' 4"  (1.626 m)   Wt 164 lb 6.4 oz (74.6 kg)   SpO2 98%   BMI 28.22 kg/m  Vitals with BMI 10/29/2019 10/23/2019 10/17/2019  Height 5\' 4"  5\' 4"  5\' 4"   Weight 164 lbs 6 oz 160 lbs 162 lbs 2 oz  BMI 28.21 18.29 93.71  Systolic 696 789 381  Diastolic 90 80 93  Pulse 78 89 71     Physical Exam   She looks systemically well.  Blood pressure for her is reasonable.  Weight is stable.  No new physical findings.  The skin lesions in question I am not sure what they are but they seem to be scattered on her arms especially measuring 1 cm in diameter.    Assessment   1. Skin lesions   2. Essential hypertension, benign   3. Vitamin D deficiency disease   4. Anxiety state   5. Atherosclerosis of native coronary artery of native heart with angina pectoris ([place])   6. Diabetes mellitus without complication ([place])   7. Mixed hyperlipidemia       Tests ordered Orders Placed This Encounter  Procedures  . CBC  .  COMPLETE METABOLIC PANEL WITH GFR  . Hemoglobin A1c  . T3, free  . VITAMIN D 25 Hydroxy (Vit-D Deficiency, Fractures)  . Lipid panel  . Ambulatory referral to Dermatology     Plan: 1. I will refer to dermatologist for further evaluation of the skin lesions. 2. Otherwise, she will continue with losartan and amlodipine refill for hypertension which is keeping her blood pressure under reasonable control. 3. She will continue with atorvastatin for hyperlipidemia and we will check a  lipid panel. 4. She will continue with vitamin D3 supplementation and we will check vitamin D levels. 5. Further recommendations will depend on blood results and I will see her in 3 to 4 months time for follow-up   No orders of the defined types were placed in this encounter.   [name], MD

## 2019-10-30 ENCOUNTER — Ambulatory Visit (INDEPENDENT_AMBULATORY_CARE_PROVIDER_SITE_OTHER): Payer: Medicare Other

## 2019-10-30 LAB — LIPID PANEL
Cholesterol: 218 mg/dL — ABNORMAL HIGH (ref <200)
HDL: 50 mg/dL (ref >=50)
LDL Cholesterol (Calc): 139 mg/dL (calc) — ABNORMAL HIGH
Non-HDL Cholesterol (Calc): 168 mg/dL (calc) — ABNORMAL HIGH (ref <130)
Total CHOL/HDL Ratio: 4.4 (calc) (ref <5.0)
Triglycerides: 154 mg/dL — ABNORMAL HIGH (ref <150)

## 2019-10-30 LAB — VITAMIN D 25 HYDROXY (VIT D DEFICIENCY, FRACTURES): Vit D, 25-Hydroxy: 29 ng/mL — ABNORMAL LOW (ref 30–100)

## 2019-10-30 LAB — COMPLETE METABOLIC PANEL WITH GFR
AG Ratio: 1.4 (calc) (ref 1.0–2.5)
ALT: 14 U/L (ref 6–29)
AST: 18 U/L (ref 10–35)
Albumin: 4.2 g/dL (ref 3.6–5.1)
Alkaline phosphatase (APISO): 134 U/L (ref 37–153)
BUN: 7 mg/dL (ref 7–25)
CO2: 26 mmol/L (ref 20–32)
Calcium: 9.7 mg/dL (ref 8.6–10.4)
Chloride: 105 mmol/L (ref 98–110)
Creat: 0.81 mg/dL (ref 0.50–1.05)
GFR, Est African American: 96 mL/min/{1.73_m2} (ref >=60)
GFR, Est Non African American: 83 mL/min/{1.73_m2} (ref >=60)
Globulin: 3 g/dL (calc) (ref 1.9–3.7)
Glucose, Bld: 118 mg/dL — ABNORMAL HIGH (ref 65–99)
Potassium: 4.3 mmol/L (ref 3.5–5.3)
Sodium: 141 mmol/L (ref 135–146)
Total Bilirubin: 0.6 mg/dL (ref 0.2–1.2)
Total Protein: 7.2 g/dL (ref 6.1–8.1)

## 2019-10-30 LAB — CBC
HCT: 41.8 % (ref 35.0–45.0)
Hemoglobin: 13.4 g/dL (ref 11.7–15.5)
MCH: 27.3 pg (ref 27.0–33.0)
MCHC: 32.1 g/dL (ref 32.0–36.0)
MCV: 85.1 fL (ref 80.0–100.0)
MPV: 10 fL (ref 7.5–12.5)
Platelets: 265 10*3/uL (ref 140–400)
RBC: 4.91 10*6/uL (ref 3.80–5.10)
RDW: 14.1 % (ref 11.0–15.0)
WBC: 6.2 10*3/uL (ref 3.8–10.8)

## 2019-10-30 LAB — HEMOGLOBIN A1C
Hgb A1c MFr Bld: 5.1 % of total Hgb (ref <5.7)
Mean Plasma Glucose: 100 (calc)
eAG (mmol/L): 5.5 (calc)

## 2019-10-30 LAB — T3, FREE: T3, Free: 3.2 pg/mL (ref 2.3–4.2)

## 2019-10-30 NOTE — Progress Notes (Signed)
Call this patient and let her know that her cholesterol numbers are improving which is great news but her vitamin D level is extremely low so please stress to her that she must take vitamin D3 10,000 units every day.Follow-up as scheduled.

## 2019-10-30 NOTE — Progress Notes (Signed)
Pt was given resulta. Asked if she was taking the Vit D3 5,000 iu's? Pt is taking 2 gel caps daily.

## 2019-11-06 ENCOUNTER — Ambulatory Visit (INDEPENDENT_AMBULATORY_CARE_PROVIDER_SITE_OTHER): Payer: Medicare Other

## 2019-11-13 ENCOUNTER — Ambulatory Visit (INDEPENDENT_AMBULATORY_CARE_PROVIDER_SITE_OTHER): Payer: Medicare Other

## 2019-11-14 ENCOUNTER — Encounter: Payer: Self-pay | Admitting: *Deleted

## 2019-11-14 NOTE — Telephone Encounter (Signed)
This encounter was created in error - please disregard.

## 2019-11-16 ENCOUNTER — Other Ambulatory Visit (INDEPENDENT_AMBULATORY_CARE_PROVIDER_SITE_OTHER): Payer: Self-pay | Admitting: *Deleted

## 2019-11-16 ENCOUNTER — Other Ambulatory Visit (INDEPENDENT_AMBULATORY_CARE_PROVIDER_SITE_OTHER): Payer: Self-pay | Admitting: Internal Medicine

## 2019-11-19 ENCOUNTER — Other Ambulatory Visit (INDEPENDENT_AMBULATORY_CARE_PROVIDER_SITE_OTHER): Payer: Self-pay | Admitting: Internal Medicine

## 2019-11-19 DIAGNOSIS — E559 Vitamin D deficiency, unspecified: Secondary | ICD-10-CM

## 2019-11-19 MED ORDER — PANTOPRAZOLE SODIUM 40 MG PO TBEC: 40.0000 mg | DELAYED_RELEASE_TABLET | Freq: Two times a day (BID) | ORAL | 1 refills | Status: DC

## 2019-11-19 MED ORDER — LINZESS 145 MCG PO CAPS: 145.0000 ug | ORAL_CAPSULE | Freq: Every day | ORAL | 1 refills | Status: DC

## 2019-11-19 MED ORDER — VITAMIN D3 25 MCG (1000 UT) PO CHEW: 2000.0000 [IU] | CHEWABLE_TABLET | Freq: Every day | ORAL | 1 refills | Status: DC

## 2019-11-19 MED ORDER — ESCITALOPRAM OXALATE 20 MG PO TABS: 20.0000 mg | ORAL_TABLET | Freq: Every day | ORAL | 1 refills | Status: DC

## 2019-11-19 MED ORDER — THYROID 120 MG PO TABS: 120.0000 mg | ORAL_TABLET | Freq: Every day | ORAL | 1 refills | Status: DC

## 2019-11-19 MED ORDER — AMLODIPINE BESYLATE 10 MG PO TABS: 10.0000 mg | ORAL_TABLET | Freq: Every day | ORAL | 1 refills | Status: DC

## 2019-11-20 ENCOUNTER — Ambulatory Visit (INDEPENDENT_AMBULATORY_CARE_PROVIDER_SITE_OTHER): Payer: Medicare Other

## 2019-11-21 ENCOUNTER — Other Ambulatory Visit: Payer: Self-pay | Admitting: *Deleted

## 2019-11-21 MED ORDER — ISOSORBIDE MONONITRATE ER 30 MG PO TB24: 90.0000 mg | ORAL_TABLET | Freq: Every day | ORAL | 1 refills | Status: DC

## 2019-11-21 MED ORDER — ASPIRIN EC 81 MG PO TBEC: 162.0000 mg | DELAYED_RELEASE_TABLET | Freq: Two times a day (BID) | ORAL | 1 refills | Status: DC

## 2019-11-21 MED ORDER — POTASSIUM CHLORIDE 20 MEQ/15ML (10%) PO SOLN: 20.0000 meq | Freq: Every day | ORAL | 0 refills | Status: DC

## 2019-11-26 ENCOUNTER — Encounter (HOSPITAL_COMMUNITY)
Admission: RE | Admit: 2019-11-26 | Discharge: 2019-11-26 | Disposition: A | Discharge status: Home / Self Care | Payer: Medicare Other | Source: Ambulatory Visit | Attending: Internal Medicine | Admitting: Internal Medicine

## 2019-11-26 ENCOUNTER — Other Ambulatory Visit: Payer: Self-pay

## 2019-11-26 ENCOUNTER — Other Ambulatory Visit (HOSPITAL_COMMUNITY)
Admission: RE | Admit: 2019-11-26 | Discharge: 2019-11-26 | Disposition: A | Discharge status: Home / Self Care | Payer: Medicare Other | Source: Ambulatory Visit | Attending: Internal Medicine | Admitting: Internal Medicine

## 2019-11-26 DIAGNOSIS — Z20822 Contact with and (suspected) exposure to covid-19: Secondary | ICD-10-CM | POA: Diagnosis not present

## 2019-11-26 DIAGNOSIS — Z01812 Encounter for preprocedural laboratory examination: Secondary | ICD-10-CM | POA: Diagnosis present

## 2019-11-26 LAB — SARS CORONAVIRUS 2 (TAT 6-24 HRS): SARS Coronavirus 2: NEGATIVE

## 2019-11-27 ENCOUNTER — Other Ambulatory Visit: Payer: Self-pay | Admitting: Cardiology

## 2019-11-27 ENCOUNTER — Ambulatory Visit (INDEPENDENT_AMBULATORY_CARE_PROVIDER_SITE_OTHER): Payer: Medicare Other

## 2019-11-28 ENCOUNTER — Ambulatory Visit (HOSPITAL_COMMUNITY): Payer: Medicare Other | Admitting: Anesthesiology

## 2019-11-28 ENCOUNTER — Encounter (HOSPITAL_COMMUNITY)
Admission: RE | Disposition: A | Discharge status: Home / Self Care | Payer: Self-pay | Source: Home / Self Care | Attending: Internal Medicine

## 2019-11-28 ENCOUNTER — Other Ambulatory Visit (INDEPENDENT_AMBULATORY_CARE_PROVIDER_SITE_OTHER): Payer: Self-pay | Admitting: *Deleted

## 2019-11-28 ENCOUNTER — Ambulatory Visit (HOSPITAL_COMMUNITY)
Admission: RE | Admit: 2019-11-28 | Discharge: 2019-11-28 | Disposition: A | Discharge status: Home / Self Care | Payer: Medicare Other | Attending: Internal Medicine | Admitting: Internal Medicine

## 2019-11-28 DIAGNOSIS — E782 Mixed hyperlipidemia: Secondary | ICD-10-CM | POA: Insufficient documentation

## 2019-11-28 DIAGNOSIS — K228 Other specified diseases of esophagus: Secondary | ICD-10-CM

## 2019-11-28 DIAGNOSIS — Q394 Esophageal web: Secondary | ICD-10-CM

## 2019-11-28 DIAGNOSIS — R634 Abnormal weight loss: Secondary | ICD-10-CM | POA: Insufficient documentation

## 2019-11-28 DIAGNOSIS — Z7902 Long term (current) use of antithrombotics/antiplatelets: Secondary | ICD-10-CM | POA: Insufficient documentation

## 2019-11-28 DIAGNOSIS — Z87891 Personal history of nicotine dependence: Secondary | ICD-10-CM | POA: Diagnosis not present

## 2019-11-28 DIAGNOSIS — R1314 Dysphagia, pharyngoesophageal phase: Secondary | ICD-10-CM | POA: Diagnosis not present

## 2019-11-28 DIAGNOSIS — Z955 Presence of coronary angioplasty implant and graft: Secondary | ICD-10-CM | POA: Diagnosis not present

## 2019-11-28 DIAGNOSIS — G8929 Other chronic pain: Secondary | ICD-10-CM

## 2019-11-28 DIAGNOSIS — Z8542 Personal history of malignant neoplasm of other parts of uterus: Secondary | ICD-10-CM | POA: Diagnosis not present

## 2019-11-28 DIAGNOSIS — Z79899 Other long term (current) drug therapy: Secondary | ICD-10-CM | POA: Insufficient documentation

## 2019-11-28 DIAGNOSIS — Z923 Personal history of irradiation: Secondary | ICD-10-CM | POA: Insufficient documentation

## 2019-11-28 DIAGNOSIS — Z8711 Personal history of peptic ulcer disease: Secondary | ICD-10-CM | POA: Insufficient documentation

## 2019-11-28 DIAGNOSIS — E559 Vitamin D deficiency, unspecified: Secondary | ICD-10-CM | POA: Diagnosis not present

## 2019-11-28 DIAGNOSIS — B9681 Helicobacter pylori [H. pylori] as the cause of diseases classified elsewhere: Secondary | ICD-10-CM | POA: Insufficient documentation

## 2019-11-28 DIAGNOSIS — K2970 Gastritis, unspecified, without bleeding: Secondary | ICD-10-CM | POA: Insufficient documentation

## 2019-11-28 DIAGNOSIS — I69398 Other sequelae of cerebral infarction: Secondary | ICD-10-CM | POA: Diagnosis not present

## 2019-11-28 DIAGNOSIS — Z853 Personal history of malignant neoplasm of breast: Secondary | ICD-10-CM | POA: Diagnosis not present

## 2019-11-28 DIAGNOSIS — I25119 Atherosclerotic heart disease of native coronary artery with unspecified angina pectoris: Secondary | ICD-10-CM | POA: Diagnosis not present

## 2019-11-28 DIAGNOSIS — R1013 Epigastric pain: Secondary | ICD-10-CM | POA: Diagnosis present

## 2019-11-28 DIAGNOSIS — F419 Anxiety disorder, unspecified: Secondary | ICD-10-CM | POA: Insufficient documentation

## 2019-11-28 DIAGNOSIS — Z79890 Hormone replacement therapy (postmenopausal): Secondary | ICD-10-CM | POA: Insufficient documentation

## 2019-11-28 DIAGNOSIS — F329 Major depressive disorder, single episode, unspecified: Secondary | ICD-10-CM | POA: Insufficient documentation

## 2019-11-28 DIAGNOSIS — R110 Nausea: Secondary | ICD-10-CM

## 2019-11-28 DIAGNOSIS — K219 Gastro-esophageal reflux disease without esophagitis: Secondary | ICD-10-CM | POA: Diagnosis not present

## 2019-11-28 DIAGNOSIS — Z9221 Personal history of antineoplastic chemotherapy: Secondary | ICD-10-CM | POA: Insufficient documentation

## 2019-11-28 DIAGNOSIS — R112 Nausea with vomiting, unspecified: Secondary | ICD-10-CM | POA: Diagnosis not present

## 2019-11-28 DIAGNOSIS — Z7982 Long term (current) use of aspirin: Secondary | ICD-10-CM | POA: Insufficient documentation

## 2019-11-28 DIAGNOSIS — R519 Headache, unspecified: Secondary | ICD-10-CM | POA: Diagnosis not present

## 2019-11-28 DIAGNOSIS — I10 Essential (primary) hypertension: Secondary | ICD-10-CM | POA: Insufficient documentation

## 2019-11-28 DIAGNOSIS — I2510 Atherosclerotic heart disease of native coronary artery without angina pectoris: Secondary | ICD-10-CM | POA: Diagnosis not present

## 2019-11-28 HISTORY — PX: ESOPHAGEAL DILATION: SHX303

## 2019-11-28 HISTORY — PX: ESOPHAGOGASTRODUODENOSCOPY (EGD) WITH PROPOFOL: SHX5813

## 2019-11-28 HISTORY — PX: BIOPSY: SHX5522

## 2019-11-28 SURGERY — ESOPHAGOGASTRODUODENOSCOPY (EGD) WITH PROPOFOL
Anesthesia: General

## 2019-11-28 MED ORDER — PROPOFOL 10 MG/ML IV BOLUS
INTRAVENOUS | Status: DC | PRN
Start: 2019-11-28 — End: 2019-11-28
  Administered 2019-11-28: 30 mg via INTRAVENOUS

## 2019-11-28 MED ORDER — LIDOCAINE HCL (PF) 2 % IJ SOLN
INTRAMUSCULAR | Status: DC | PRN
  Administered 2019-11-28: 50 mg via INTRADERMAL

## 2019-11-28 MED ORDER — LACTATED RINGERS IV SOLN: Freq: Once | INTRAVENOUS | Status: AC

## 2019-11-28 MED ORDER — LIDOCAINE VISCOUS HCL 2 % MT SOLN
15.0000 mL | Freq: Once | OROMUCOSAL | Status: AC
  Administered 2019-11-28: 15 mL via OROMUCOSAL

## 2019-11-28 MED ORDER — GLYCOPYRROLATE 0.2 MG/ML IJ SOLN
0.2000 mg | Freq: Once | INTRAMUSCULAR | Status: AC
  Administered 2019-11-28: 0.2 mg via INTRAVENOUS

## 2019-11-28 MED ORDER — LACTATED RINGERS IV SOLN
INTRAVENOUS | Status: DC | PRN
Start: 2019-11-28 — End: 2019-11-28

## 2019-11-28 MED ORDER — GLYCOPYRROLATE 0.2 MG/ML IJ SOLN
INTRAMUSCULAR | Status: AC
  Filled 2019-11-28: qty 1

## 2019-11-28 MED ORDER — STERILE WATER FOR IRRIGATION IR SOLN
Status: DC | PRN
  Administered 2019-11-28: 1.5 mL

## 2019-11-28 MED ORDER — LIDOCAINE VISCOUS HCL 2 % MT SOLN
OROMUCOSAL | Status: AC
  Filled 2019-11-28: qty 15

## 2019-11-28 MED ORDER — PROPOFOL 500 MG/50ML IV EMUL
INTRAVENOUS | Status: DC | PRN
  Administered 2019-11-28: 200 ug/kg/min via INTRAVENOUS

## 2019-11-28 NOTE — Anesthesia Preprocedure Evaluation (Addendum)
Anesthesia Evaluation  Patient identified by MRN, date of birth, ID band Patient awake    Reviewed: Allergy & Precautions, NPO status , Patient's Chart, lab work & pertinent test results, reviewed documented beta blocker date and time   History of Anesthesia Complications Negative for: history of anesthetic complications  Airway Mallampati: I  TM Distance: >3 FB Neck ROM: Full    Dental  (+) Dental Advisory Given, Teeth Intact, Missing   Pulmonary former smoker,    Pulmonary exam normal breath sounds clear to auscultation       Cardiovascular hypertension, Pt. on medications and Pt. on home beta blockers + angina + CAD and + Cardiac Stents  Normal cardiovascular exam Rhythm:Regular Rate:Normal     Neuro/Psych  Headaches, Seizures -,  PSYCHIATRIC DISORDERS Anxiety Depression  Neuromuscular disease CVA    GI/Hepatic Neg liver ROS, PUD, GERD  Medicated,  Endo/Other  negative endocrine ROS  Renal/GU negative Renal ROS     Musculoskeletal negative musculoskeletal ROS (+)   Abdominal   Peds  Hematology  (+) anemia ,   Anesthesia Other Findings   Reproductive/Obstetrics negative OB ROS                           Anesthesia Physical Anesthesia Plan  ASA: III  Anesthesia Plan: General   Post-op Pain Management:    Induction: Intravenous  PONV Risk Score and Plan: TIVA  Airway Management Planned: Nasal Cannula and Natural Airway  Additional Equipment:   Intra-op Plan:   Post-operative Plan:   Informed Consent: I have reviewed the patients History and Physical, chart, labs and discussed the procedure including the risks, benefits and alternatives for the proposed anesthesia with the patient or authorized representative who has indicated his/her understanding and acceptance.     Dental advisory given  Plan Discussed with: CRNA and Surgeon  Anesthesia Plan Comments:          Anesthesia Quick Evaluation

## 2019-11-28 NOTE — Transfer of Care (Signed)
Immediate Anesthesia Transfer of Care Note  Patient: Bethany Wang  Procedure(s) Performed: ESOPHAGOGASTRODUODENOSCOPY (EGD) WITH PROPOFOL (N/A ) BIOPSY ESOPHAGEAL DILATION  Patient Location: PACU  Anesthesia Type:General  Level of Consciousness: drowsy, patient cooperative and responds to stimulation  Airway & Oxygen Therapy: Patient Spontanous Breathing and Patient connected to nasal cannula oxygen  Post-op Assessment: Report given to RN and Post -op Vital signs reviewed and stable  Post vital signs: Reviewed and stable  Last Vitals:  Vitals Value Taken Time  BP    Temp    Pulse    Resp    SpO2      Last Pain:  Vitals:   11/28/19 0943  PainSc: 6          Complications: No complications documented.

## 2019-11-28 NOTE — H&P (Signed)
Bethany Wang is an 54 y.o. female.   Chief Complaint: Bethany Wang is here for esophagogastroduodenoscopy esophageal dilation. HPI: Bethany Wang is 54 year old African-American female with multiple medical problems including chronic GERD who is heartburn is well controlled with PPI who presents with worsening postprandial epigastric pain.  Bethany Wang states Bethany Wang has had this pain off and on for the 3 to 4 years but has gotten worse lately.  Bethany Wang has postprandial nausea and vomiting.  Bethany Wang also complains of dysphagia to solids and pills.  Bethany Wang points to suprasternal area as site of bolus obstruction.  Bethany Wang denies met emesis or melena.  He says Bethany Wang has lost close to 15 pounds in the last 2 months.  Bethany Wang says Bethany Wang is on soft foods.  Bethany Wang does not take OTC NSAIDs.  Bethany Wang does not smoke cigarettes.  Bethany Wang quit 6 years ago.  Bethany Wang does not drink alcohol either.  No history of peptic ulcer disease.  Last EGD with dilation was in February 2013.  Past Medical History:  Diagnosis Date  . Anemia   . Anxiety   . Bilateral breast cysts 12/30/2015  . Brain tumor ([place])   . Breast cancer ([place])    Left Breast Cancer  . Breast disorder   . BV (bacterial vaginosis) 09/09/2015  . Coronary atherosclerosis of native coronary artery    a. s/p DES to RCA in 2013 b. DES to LAD in 2014 c. cath in 04/2016 showing patent LAD stent with D2 jailed by LAD stent and CTO of RCA with left to right collaterals present  . Cyst of pharynx or nasopharynx    [name]  . Decreased libido 12/25/2018  . Depression   . Essential hypertension   . Falls    x 3-4 in past year  . GERD (gastroesophageal reflux disease)   . Headache   . History of hematuria   . Mixed hyperlipidemia   . Personal history of chemotherapy    Left Breast Cancer  . Personal history of radiation therapy    Left Breast Cancer  . Pre-diabetes   . PUD (peptic ulcer disease)   . Seizures ([place])   . Shingles 09/09/2015  . Stroke ([place])    12-2017 on Plavix, only deficit  is headaches  . Suicide attempt ([place])    3 attempts in remote past  . Trichimoniasis 08/20/2019   Treated 08/20/19   . Uterine cancer ([place])   . Vitamin D deficiency disease 04/03/2019    Past Surgical History:  Procedure Laterality Date  . ABDOMINAL HYSTERECTOMY    . BLADDER SURGERY    . BREAST LUMPECTOMY Left   . BREAST LUMPECTOMY Right 02/24/2018   Procedure: RIGHT BREAST LUMPECTOMY ERAS PATHWAY;  Surgeon: Jovita Kussmaul, MD;  Location: [facility];  Service: General;  Laterality: Right;  . CARDIAC CATHETERIZATION N/A 05/10/2016   Procedure: Left Heart Cath and Coronary Angiography;  Surgeon: Belva Crome, MD;  Location: [facility];  Service: Cardiovascular;  Laterality: N/A;  . COLONOSCOPY  May 2010   Fle[place]man: normal rectum, internal hemorrhoids, , benign colonic polyp  . COLONOSCOPY N/A 01/05/2017   Procedure: COLONOSCOPY;  Surgeon: Rogene Houston, MD;  [facility];  Service: Endoscopy;  Laterality: N/A;  1:00  . ESOPHAGOGASTRODUODENOSCOPY     2001 Dr. Amedeo Plenty: distal esophagitis, small hiatal hernia,. Dr. Tamala Julian 2006? no records available currently, pt also reports  EGD a few years ago with Dr. Gala Romney, do not have these reports anywhere in medical records  .  ESOPHAGOGASTRODUODENOSCOPY  06/02/2011   NLZ:JQBHAL pill impaction as described above s/p dilation of a probable cervical esophageal web/bx abnormal esophageal and gastric mucosa. + H.pylori gastritis   . EYE SURGERY     Removed glass  . HAND SURGERY Right   . Left breast lumpectomy     Benign  . LEFT HEART CATH AND CORONARY ANGIOGRAPHY N/A 10/25/2018   Procedure: LEFT HEART CATH AND CORONARY ANGIOGRAPHY;  Surgeon: Wellington Hampshire, MD;  Location: [facility];  Service: Cardiovascular;  Laterality: N/A;  . LEFT HEART CATHETERIZATION WITH CORONARY ANGIOGRAM N/A 07/03/2014   Procedure: LEFT HEART CATHETERIZATION WITH CORONARY ANGIOGRAM;  Surgeon: Belva Crome, MD;  Location[facility];  Service:  Cardiovascular;  Laterality: N/A;  . POLYPECTOMY  01/05/2017   Procedure: POLYPECTOMY;  Surgeon: Rogene Houston, MD;  [facility];  Service: Endoscopy;;  colon  . SHOULDER SURGERY Left     Family History  Problem Relation Age of Onset  . Colon cancer Paternal Grandfather   . Cancer Father   . Cancer Maternal Uncle   . Alzheimer's disease Paternal Aunt   . Cirrhosis Maternal Uncle   . Cancer Paternal Aunt        lung  . Aneurysm Mother        brain  . Heart disease Brother   . Heart attack Brother   . Mental illness Daughter   . ADD / ADHD Daughter   . Bipolar disorder Daughter   . Mental illness Son   . ADD / ADHD Son   . Bipolar disorder Son   . Mental illness Son   . ADD / ADHD Son   . Bipolar disorder Son    Social History:  reports that Bethany Wang quit smoking about 7 years ago. Bethany Wang smoking use included cigarettes and cigars. Bethany Wang started smoking about 36 years ago. Bethany Wang has a 6.40 pack-year smoking history. Bethany Wang has never used smokeless tobacco. Bethany Wang reports that Bethany Wang does not drink alcohol and does not use drugs.  Allergies:  Allergies  Allergen Reactions  . Bee Venom Anaphylaxis and Other (See Comments)    "throat swelled and had to the hospital" - Yellow jacket sting  . Penicillins Anaphylaxis and Other (See Comments)    Has Bethany Wang had a PCN reaction causing immediate rash, facial/tongue/throat swelling, SOB or lightheadedness with hypotension: Yes Has Bethany Wang had a PCN reaction causing severe rash involving mucus membranes or skin necrosis: Yes Has Bethany Wang had a PCN reaction that required hospitalization Yes Has Bethany Wang had a PCN reaction occurring within the last 10 years: Yes If all of the above answers are "NO", then may proceed with Cephalosporin use. Bethany Wang states Bethany Wang carries an epi-pen  . Shellfish Allergy Swelling    Facility-Administered Medications Prior to Admission  Medication Dose Route Frequency Provider Last Rate Last Admin  . NONFORMULARY OR  COMPOUNDED ITEM 0.2 mL  0.2 mL Intramuscular Q7 days [name], Nimish C, MD   0.2 mL at 10/23/19 1002   Medications Prior to Admission  Medication Sig Dispense Refill  . ALPRAZolam (XANAX) 0.5 MG tablet Take 1 tablet (0.5 mg total) by mouth every morning. (Bethany Wang taking differently: Take 0.5 mg by mouth daily. ) 30 tablet 0  . amLODipine (NORVASC) 10 MG tablet Take 1 tablet (10 mg total) by mouth daily. 90 tablet 1  . aspirin EC 81 MG tablet Take 2 tablets (162 mg total) by mouth 2 (two) times daily. 180 tablet 1  . atorvastatin (  LIPITOR) 40 MG tablet Take 1 tablet (40 mg total) by mouth daily at 6 PM. 30 tablet 0  . Cholecalciferol (VITAMIN D3) 25 MCG (1000 UT) CHEW Chew 2,000 Units by mouth daily. 180 tablet 1  . clopidogrel (PLAVIX) 75 MG tablet Take 75 mg by mouth daily.     [name] escitalopram (LEXAPRO) 20 MG tablet Take 1 tablet (20 mg total) by mouth daily. 90 tablet 1  . fluticasone (FLONASE) 50 MCG/ACT nasal spray Place 1 spray into both nostrils daily. 9.9 mL 0  . isosorbide mononitrate (IMDUR) 30 MG 24 hr tablet Take 3 tablets (90 mg total) by mouth daily. 270 tablet 1  . Light Mineral Oil-Mineral Oil (RETAINE MGD OP) Apply 1 application to eye daily.     [name] LINZESS 145 MCG CAPS capsule Take 1 capsule (145 mcg total) by mouth daily. 90 capsule 1  . losartan (COZAAR) 100 MG tablet Take 1 tablet (100 mg total) by mouth daily.    . metoprolol tartrate (LOPRESSOR) 50 MG tablet Take 1.5 tablets (75 mg total) by mouth 2 (two) times daily. 90 tablet 6  . nitroGLYCERIN (NITROSTAT) 0.4 MG SL tablet Place 1 tablet (0.4 mg total) under the tongue every 5 (five) minutes as needed for chest pain. 25 tablet 3  . NONFORMULARY OR COMPOUNDED ITEM Inject 5 mg into the muscle every 7 (seven) days. Testosterone cypionate in olive  oil (25 mg/mL).  Dispense 5 mL vial. 1 each 3  . pantoprazole (PROTONIX) 40 MG tablet Take 1 tablet (40 mg total) by mouth 2 (two) times daily before a meal. 180 tablet 1  . potassium  chloride 20 MEQ/15ML (10%) SOLN TAKE 15 MLS (20 MEQ TOTAL) BY MOUTH DAILY. 473 mL 1  . prednisoLONE acetate (PRED FORTE) 1 % ophthalmic suspension Place 1 drop into both eyes at bedtime.     [name] thyroid (NP THYROID) 120 MG tablet Take 1 tablet (120 mg total) by mouth daily before breakfast. 90 tablet 1  . WIXELA INHUB 100-50 MCG/DOSE AEPB INHALE 1 PUFF INTO THE LUNGS TWICE DAILY. 60 each 0  . Medical ID Bracelet MISC 1 Units by Does not apply route daily. 1 each o  . Testosterone Propionate (FIRST-TESTOSTERONE MC) 2 % CREA Place 5 mg onto the skin daily. 30 g 0    Results for orders placed or performed during the hospital encounter of 11/26/19 (from the past 48 hour(s))  SARS CORONAVIRUS 2 (TAT 6-24 HRS) Nasopharyngeal Nasopharyngeal Swab     Status: None   Collection Time: 11/26/19 10:43 AM   Specimen: Nasopharyngeal Swab  Result Value Ref Range   SARS Coronavirus 2 NEGATIVE NEGATIVE    Comment: (NOTE) SARS-CoV-2 target nucleic acids are NOT DETECTED.  The SARS-CoV-2 RNA is generally detectable in upper and lower respiratory specimens during the acute phase of infection. Negative results do not preclude SARS-CoV-2 infection, do not rule out co-infections with other pathogens, and should not be used as the sole basis for treatment or other Bethany Wang management decisions. Negative results must be combined with clinical observations, Bethany Wang history, and epidemiological information. The expected result is Negative.  Fact Sheet for Patients: SugarRoll.be  Fact Sheet for Healthcare Providers: https://www.woods-mathews.com/  This test is not yet approved or cleared by the [place] FDA and  has been authorized for detection and/or diagnosis of SARS-CoV-2 by FDA under an Emergency Use Authorization (EUA). This EUA will remain  in effect (meaning this test can be used) for the duration of the COVID-19  declaration under Se ction 564(b)(1) of the  Act, 21 U.S.C. section 360bbb-3(b)(1), unless the authorization is terminated or revoked sooner.  Performed at [facility], [place] [address], [place], [place] [address]    No results found.  Review of Systems  Blood pressure (!) 159/101, pulse (!) 55, temperature 97.9 F (36.6 C), resp. rate 14, SpO2 98 %. Physical Exam HENT:     Mouth/Throat:     Mouth: Mucous membranes are moist.     Pharynx: Oropharynx is clear.  Eyes:     General: No scleral icterus.    Conjunctiva/sclera: Conjunctivae normal.  Cardiovascular:     Rate and Rhythm: Normal rate and regular rhythm.     Heart sounds: Normal heart sounds. No murmur heard.   Pulmonary:     Effort: Pulmonary effort is normal.     Breath sounds: Normal breath sounds.  Abdominal:     Comments: Abdomen is symmetrical and soft.  Bethany Wang is tender in epigastrium, periumbilical region as well as below the right costal margin.  No organomegaly or masses.  Musculoskeletal:        General: No swelling.     Cervical back: Neck supple.  Lymphadenopathy:     Cervical: No cervical adenopathy.  Skin:    General: Skin is warm.  Neurological:     Mental Status: Bethany Wang is alert.   Lab data from 10/29/2019 reviewed.  Bethany Wang had normal CBC and comprehensive chemistry panel except glucose of 118.  Assessment/Plan  Dysphagia to solids and pills Bethany Wang with chronic GERD. Epigastric pain nausea vomiting and weight loss. Esophagogastroduodenoscopy with esophageal dilation.   Hildred Laser, MD 11/28/2019, 9:36 AM

## 2019-11-28 NOTE — Anesthesia Postprocedure Evaluation (Signed)
Anesthesia Post Note  Patient: Bethany Wang  Procedure(s) Performed: ESOPHAGOGASTRODUODENOSCOPY (EGD) WITH PROPOFOL (N/A ) BIOPSY ESOPHAGEAL DILATION  Patient location during evaluation: PACU Anesthesia Type: General Level of consciousness: awake and alert and oriented Pain management: pain level controlled Vital Signs Assessment: post-procedure vital signs reviewed and stable Respiratory status: spontaneous breathing and respiratory function stable Cardiovascular status: blood pressure returned to baseline Postop Assessment: no apparent nausea or vomiting Anesthetic complications: no   No complications documented.   Last Vitals:  Vitals:   11/28/19 1026 11/28/19 1030  BP:  (!) 142/87  Pulse: (!) 50 (!) 50  Resp: 13   Temp:  (!) 36.4 C  SpO2:  100%    Last Pain:  Vitals:   11/28/19 1030  TempSrc: Oral  PainSc:                  [name]

## 2019-11-28 NOTE — Op Note (Signed)
[facility] Patient Name: Bethany Wang Procedure Date: 11/28/2019 9:17 AM MRN: [id number] Date of Birth: 1965-12-03 Attending MD: Hildred Laser , MD CSN: [id number] Age: 54 Admit Type: Outpatient Procedure:                Upper GI endoscopy Indications:              Epigastric abdominal pain, Esophageal dysphagia,                            Nausea with vomiting, Weight loss Providers:                Hildred Laser, MD, Janeece Riggers, RN, Lambert Mody, Casimer Bilis, Technician,                            Randa Spike, Technician Referring MD:             [name], MD Medicines:                Propofol per Anesthesia Complications:            No immediate complications. Estimated Blood Loss:     Estimated blood loss was minimal. Procedure:                Pre-Anesthesia Assessment:                           - Prior to the procedure, a History and Physical                            was performed, and patient medications and                            allergies were reviewed. The patient's tolerance of                            previous anesthesia was also reviewed. The risks                            and benefits of the procedure and the sedation                            options and risks were discussed with the patient.                            All questions were answered, and informed consent                            was obtained. Prior Anticoagulants: The patient                            last took Plavix (clopidogrel) 5 days prior to the  procedure. ASA Grade Assessment: III - A patient                            with severe systemic disease. After reviewing the                            risks and benefits, the patient was deemed in                            satisfactory condition to undergo the procedure.                           After obtaining informed consent, the endoscope was                             passed under direct vision. Throughout the                            procedure, the patient's blood pressure, pulse, and                            oxygen saturations were monitored continuously. The                            [id number]-[id number] ([id number]) scope was introduced through the                            mouth, and advanced to the second part of duodenum.                            The upper GI endoscopy was accomplished without                            difficulty. The patient tolerated the procedure                            well. Scope In: 9:46:25 AM Scope Out: 9:58:24 AM Total Procedure Duration: 0 hours 11 minutes 59 seconds  Findings:      The hypopharynx was normal.      A web was found in the proximal esophagus. The scope was withdrawn.       Dilation was performed with a Maloney dilator with mild resistance at 67       Fr. The dilation site was examined following endoscope reinsertion and       showed mild mucosal disruption, complete resolution of luminal narrowing       and no perforation.      The exam of the esophagus was otherwise normal.      The Z-line was irregular and was found 41 cm from the incisors.      Patchy mild inflammation characterized by congestion (edema), erythema       and friability was found in the gastric body and in the gastric antrum.       Biopsies were taken with a cold forceps for histology. The pathology       specimen from gastric  antrum was placed into Bottle Number 1. The       pathology specimen from gastric body was placed into Bottle Number 2.      The duodenal bulb and second portion of the duodenum were normal. Impression:               - Normal hypopharynx.                           - Web in the proximal esophagus. Dilated.                           - Z-line irregular, 41 cm from the incisors.                           - Gastritis. Biopsied antrum and gastric body.                           - Normal duodenal bulb and  second portion of the                            duodenum. Moderate Sedation:      Per Anesthesia Care Recommendation:           - Patient has a contact number available for                            emergencies. The signs and symptoms of potential                            delayed complications were discussed with the                            patient. Return to normal activities tomorrow.                            Written discharge instructions were provided to the                            patient.                           - Resume previous diet today.                           - Continue present medications.                           - Resume Aspirin and Plavix (clopidogrel) in 3 days                            at prior dose.                           - Await pathology results.                           - Perform an abdominal  ultrasound at appointment to                            be scheduled. Procedure Code(s):        --- Professional ---                           [phone], Esophagogastroduodenoscopy, flexible,                            transoral; with biopsy, single or multiple                           43450, Dilation of esophagus, by unguided sound or                            bougie, single or multiple passes Diagnosis Code(s):        --- Professional ---                           Q39.4, Esophageal web                           K22.8, Other specified diseases of esophagus                           K29.70, Gastritis, unspecified, without bleeding                           R10.13, Epigastric pain                           R13.14, Dysphagia, pharyngoesophageal phase                           R11.2, Nausea with vomiting, unspecified                           R63.4, Abnormal weight loss CPT copyright 2019 [employer] Association. All rights reserved. The codes documented in this report are preliminary and upon coder review may  be revised to meet current compliance  requirements. Hildred Laser, MD Hildred Laser, MD 11/28/2019 10:08:42 AM This report has been signed electronically. Number of Addenda: 0

## 2019-11-28 NOTE — Discharge Instructions (Signed)
Resume aspirin and clopidogrel on 12/01/2019 Please do not take OTC NSAIDs such as Advil and Aleve or BC powder. Resume other medications as before. Resume usual diet. No driving for 24 hours. Physician will call with results of biopsy. Abdominal ultrasound to be scheduled.  PATIENT INSTRUCTIONS POST-ANESTHESIA  IMMEDIATELY FOLLOWING SURGERY:  Do not drive or operate machinery for the first twenty four hours after surgery.  Do not make any important decisions for twenty four hours after surgery or while taking narcotic pain medications or sedatives.  If you develop intractable nausea and vomiting or a severe headache please notify your doctor immediately.  FOLLOW-UP:  Please make an appointment with your surgeon as instructed. You do not need to follow up with anesthesia unless specifically instructed to do so.  WOUND CARE INSTRUCTIONS (if applicable):  Keep a dry clean dressing on the anesthesia/puncture wound site if there is drainage.  Once the wound has quit draining you may leave it open to air.  Generally you should leave the bandage intact for twenty four hours unless there is drainage.  If the epidural site drains for more than 36-48 hours please call the anesthesia department.  QUESTIONS?:  Please feel free to call your physician or the hospital operator if you have any questions, and they will be happy to assist you.    Upper Endoscopy, Adult, Care After This sheet gives you information about how to care for yourself after your procedure. Your health care provider may also give you more specific instructions. If you have problems or questions, contact your health care provider. What can I expect after the procedure? After the procedure, it is common to have:  A sore throat.  Mild stomach pain or discomfort.  Bloating.  Nausea. Follow these instructions at home:   Follow instructions from your health care provider about what to eat or drink after your procedure.  Return  to your normal activities as told by your health care provider. Ask your health care provider what activities are safe for you.  Take over-the-counter and prescription medicines only as told by your health care provider.  Do not drive for 24 hours if you were given a sedative during your procedure.  Keep all follow-up visits as told by your health care provider. This is important. Contact a health care provider if you have:  A sore throat that lasts longer than one day.  Trouble swallowing. Get help right away if:  You vomit blood or your vomit looks like coffee grounds.  You have: ? A fever. ? Bloody, black, or tarry stools. ? A severe sore throat or you cannot swallow. ? Difficulty breathing. ? Severe pain in your chest or abdomen. Summary  After the procedure, it is common to have a sore throat, mild stomach discomfort, bloating, and nausea.  Do not drive for 24 hours if you were given a sedative during the procedure.  Follow instructions from your health care provider about what to eat or drink after your procedure.  Return to your normal activities as told by your health care provider. This information is not intended to replace advice given to you by your health care provider. Make sure you discuss any questions you have with your health care provider. Document Revised: 09/20/2017 Document Reviewed: 08/29/2017 Elsevier Patient Education  [place].   Esophageal Dilatation Esophageal dilatation, also called esophageal dilation, is a procedure to widen or open (dilate) a blocked or narrowed part of the esophagus. The esophagus is the part  of the body that moves food and liquid from the mouth to the stomach. You may need this procedure if:  You have a buildup of scar tissue in your esophagus that makes it difficult, painful, or impossible to swallow. This can be caused by gastroesophageal reflux disease (GERD).  You have cancer of the esophagus.  There is a  problem with how food moves through your esophagus. In some cases, you may need this procedure repeated at a later time to dilate the esophagus gradually. Tell a health care provider about:  Any allergies you have.  All medicines you are taking, including vitamins, herbs, eye drops, creams, and over-the-counter medicines.  Any problems you or family members have had with anesthetic medicines.  Any blood disorders you have.  Any surgeries you have had.  Any medical conditions you have.  Any antibiotic medicines you are required to take before dental procedures.  Whether you are pregnant or may be pregnant. What are the risks? Generally, this is a safe procedure. However, problems may occur, including:  Bleeding due to a tear in the lining of the esophagus.  A hole (perforation) in the esophagus. What happens before the procedure?  Follow instructions from your health care provider about eating or drinking restrictions.  Ask your health care provider about changing or stopping your regular medicines. This is especially important if you are taking diabetes medicines or blood thinners.  Plan to have someone take you home from the hospital or clinic.  Plan to have a responsible adult care for you for at least 24 hours after you leave the hospital or clinic. This is important. What happens during the procedure?  You may be given a medicine to help you relax (sedative).  A numbing medicine may be sprayed into the back of your throat, or you may gargle the medicine.  Your health care provider may perform the dilatation using various surgical instruments, such as: ? Simple dilators. This instrument is carefully placed in the esophagus to stretch it. ? Guided wire bougies. This involves using an endoscope to insert a wire into the esophagus. A dilator is passed over this wire to enlarge the esophagus. Then the wire is removed. ? Balloon dilators. An endoscope with a small balloon at  the end is inserted into the esophagus. The balloon is inflated to stretch the esophagus and open it up. The procedure may vary among health care providers and hospitals. What happens after the procedure?  Your blood pressure, heart rate, breathing rate, and blood oxygen level will be monitored until the medicines you were given have worn off.  Your throat may feel slightly sore and numb. This will improve slowly over time.  You will not be allowed to eat or drink until your throat is no longer numb.  When you are able to drink, urinate, and sit on the edge of the bed without nausea or dizziness, you may be able to return home. Follow these instructions at home:  Take over-the-counter and prescription medicines only as told by your health care provider.  Do not drive for 24 hours if you were given a sedative during your procedure.  You should have a responsible adult with you for 24 hours after the procedure.  Follow instructions from your health care provider about any eating or drinking restrictions.  Do not use any products that contain nicotine or tobacco, such as cigarettes and e-cigarettes. If you need help quitting, ask your health care provider.  Keep all follow-up visits  as told by your health care provider. This is important. Get help right away if you:  Have a fever.  Have chest pain.  Have pain that is not relieved by medication.  Have trouble breathing.  Have trouble swallowing.  Vomit blood. Summary  Esophageal dilatation, also called esophageal dilation, is a procedure to widen or open (dilate) a blocked or narrowed part of the esophagus.  Plan to have someone take you home from the hospital or clinic.  For this procedure, a numbing medicine may be sprayed into the back of your throat, or you may gargle the medicine.  Do not drive for 24 hours if you were given a sedative during your procedure. This information is not intended to replace advice given to  you by your health care provider. Make sure you discuss any questions you have with your health care provider. Document Revised: 01/24/2019 Document Reviewed: 02/01/2017 Elsevier Patient Education  [place].    Gastritis, Adult  Gastritis is swelling (inflammation) of the stomach. Gastritis can develop quickly (acute). It can also develop slowly over time (chronic). It is important to get help for this condition. If you do not get help, your stomach can bleed, and you can get sores (ulcers) in your stomach. What are the causes? This condition may be caused by:  Germs that get to your stomach.  Drinking too much alcohol.  Medicines you are taking.  Too much acid in the stomach.  A disease of the intestines or stomach.  Stress.  An allergic reaction.  Crohn's disease.  Some cancer treatments (radiation). Sometimes the cause of this condition is not known. What are the signs or symptoms? Symptoms of this condition include:  Pain in your stomach.  A burning feeling in your stomach.  Feeling sick to your stomach (nauseous).  Throwing up (vomiting).  Feeling too full after you eat.  Weight loss.  Bad breath.  Throwing up blood.  Blood in your poop (stool). How is this diagnosed? This condition may be diagnosed with:  Your medical history and symptoms.  A physical exam.  Tests. These can include: ? Blood tests. ? Stool tests. ? A procedure to look inside your stomach (upper endoscopy). ? A test in which a sample of tissue is taken for testing (biopsy). How is this treated? Treatment for this condition depends on what caused it. You may be given:  Antibiotic medicine, if your condition was caused by germs.  H2 blockers and similar medicines, if your condition was caused by too much acid. Follow these instructions at home: Medicines  Take over-the-counter and prescription medicines only as told by your doctor.  If you were prescribed an  antibiotic medicine, take it as told by your doctor. Do not stop taking it even if you start to feel better. Eating and drinking   Eat small meals often, instead of large meals.  Avoid foods and drinks that make your symptoms worse.  Drink enough fluid to keep your pee (urine) pale yellow. Alcohol use  Do not drink alcohol if: ? Your doctor tells you not to drink. ? You are pregnant, may be pregnant, or are planning to become pregnant.  If you drink alcohol: ? Limit your use to:  0-1 drink a day for women.  0-2 drinks a day for men. ? Be aware of how much alcohol is in your drink. In the [place] one drink equals one 12 oz bottle of beer (355 mL), one 5 oz glass of wine (148  mL), or one 1 oz glass of hard liquor (44 mL). General instructions  Talk with your doctor about ways to manage stress. You can exercise or do deep breathing, meditation, or yoga.  Do not smoke or use products that have nicotine or tobacco. If you need help quitting, ask your doctor.  Keep all follow-up visits as told by your doctor. This is important. Contact a doctor if:  Your symptoms get worse.  Your symptoms go away and then come back. Get help right away if:  You throw up blood or something that looks like coffee grounds.  You have black or dark red poop.  You throw up any time you try to drink fluids.  Your stomach pain gets worse.  You have a fever.  You do not feel better after one week. Summary  Gastritis is swelling (inflammation) of the stomach.  You must get help for this condition. If you do not get help, your stomach can bleed, and you can get sores (ulcers).  This condition is diagnosed with medical history, physical exam, or tests.  You can be treated with medicines for germs or medicines to block too much acid in your stomach. This information is not intended to replace advice given to you by your health care provider. Make sure you discuss any questions you have with your  health care provider. Document Revised: 08/16/2017 Document Reviewed: 08/16/2017 Elsevier Patient Education  [place].

## 2019-11-29 ENCOUNTER — Other Ambulatory Visit: Payer: Self-pay

## 2019-11-29 LAB — SURGICAL PATHOLOGY

## 2019-11-30 ENCOUNTER — Encounter (HOSPITAL_COMMUNITY): Payer: Self-pay | Admitting: Internal Medicine

## 2019-11-30 ENCOUNTER — Other Ambulatory Visit (INDEPENDENT_AMBULATORY_CARE_PROVIDER_SITE_OTHER): Payer: Self-pay | Admitting: Internal Medicine

## 2019-11-30 MED ORDER — PYLERA 140-125-125 MG PO CAPS: 3.0000 | ORAL_CAPSULE | Freq: Three times a day (TID) | ORAL | 0 refills | Status: DC

## 2019-12-04 ENCOUNTER — Ambulatory Visit (INDEPENDENT_AMBULATORY_CARE_PROVIDER_SITE_OTHER): Payer: Medicare Other | Admitting: Internal Medicine

## 2019-12-04 ENCOUNTER — Encounter (INDEPENDENT_AMBULATORY_CARE_PROVIDER_SITE_OTHER): Payer: Self-pay

## 2019-12-04 ENCOUNTER — Other Ambulatory Visit: Payer: Self-pay

## 2019-12-04 ENCOUNTER — Encounter (INDEPENDENT_AMBULATORY_CARE_PROVIDER_SITE_OTHER): Payer: Self-pay | Admitting: Internal Medicine

## 2019-12-04 VITALS — BP 140/80 | HR 84 | Temp 97.5°F | Resp 19 | Ht 64.0 in | Wt 160.0 lb

## 2019-12-04 DIAGNOSIS — Z79899 Other long term (current) drug therapy: Secondary | ICD-10-CM | POA: Diagnosis not present

## 2019-12-04 NOTE — Progress Notes (Signed)
Pt said she has no medications right now.

## 2019-12-04 NOTE — Progress Notes (Signed)
Metrics: Intervention Frequency ACO  Documented Smoking Status Yearly  Screened one or more times in 24 months  Cessation Counseling or  Active cessation medication Past 24 months  Past 24 months   Guideline developer: UpToDate (See UpToDate for funding source) Date Released: 2014       Wellness Office Visit  Subjective:  Patient ID: Bethany Wang, female    DOB: 11-26-1965  Age: 54 y.o. MRN: [id number]  CC: This was an unscheduled visit for the patient who had concerns about her medications.  HPI  She told me that she needed clear instructions on when she should each medication that she is on. Past Medical History:  Diagnosis Date   Anemia    Anxiety    Bilateral breast cysts 12/30/2015   Brain tumor ([place])    Breast cancer ([place])    Left Breast Cancer   Breast disorder    BV (bacterial vaginosis) 09/09/2015   Coronary atherosclerosis of native coronary artery    a. s/p DES to RCA in 2013 b. DES to LAD in 2014 c. cath in 04/2016 showing patent LAD stent with D2 jailed by LAD stent and CTO of RCA with left to right collaterals present   Cyst of pharynx or nasopharynx    [name]   Decreased libido 12/25/2018   Depression    Essential hypertension    Falls    x 3-4 in past year   GERD (gastroesophageal reflux disease)    Headache    History of hematuria    Mixed hyperlipidemia    Personal history of chemotherapy    Left Breast Cancer   Personal history of radiation therapy    Left Breast Cancer   Pre-diabetes    PUD (peptic ulcer disease)    Seizures ([place])    Shingles 09/09/2015   Stroke ([place])    12-2017 on Plavix, only deficit is headaches   Suicide attempt ([place])    3 attempts in remote past   Trichimoniasis 08/20/2019   Treated 08/20/19    Uterine cancer ([place])    Vitamin D deficiency disease 04/03/2019   Past Surgical History:  Procedure Laterality Date   ABDOMINAL HYSTERECTOMY     BIOPSY  11/28/2019    Procedure: BIOPSY;  Surgeon: Rogene Houston, MD;  [facility];  Service: Endoscopy;;  antral, gastric body    BLADDER SURGERY     BREAST LUMPECTOMY Left    BREAST LUMPECTOMY Right 02/24/2018   Procedure: RIGHT BREAST LUMPECTOMY ERAS PATHWAY;  Surgeon: Jovita Kussmaul, MD;  Location: [facility] OR;  Service: General;  Laterality: Right;   CARDIAC CATHETERIZATION N/A 05/10/2016   Procedure: Left Heart Cath and Coronary Angiography;  Surgeon: Belva Crome, MD;  Location: [facility];  Service: Cardiovascular;  Laterality: N/A;   COLONOSCOPY  May 2010   Fleishman: normal rectum, internal hemorrhoids, , benign colonic polyp   COLONOSCOPY N/A 01/05/2017   Procedure: COLONOSCOPY;  Surgeon: Rogene Houston, MD;  [facility];  Service: Endoscopy;  Laterality: N/A;  1:00   ESOPHAGEAL DILATION  11/28/2019   Procedure: ESOPHAGEAL DILATION;  Surgeon: Rogene Houston, MD;  [facility];  Service: Endoscopy;;   ESOPHAGOGASTRODUODENOSCOPY     2001 Dr. Amedeo Plenty: distal esophagitis, small hiatal hernia,. Dr. Tamala Julian 2006? no records available currently, pt also reports  EGD a few years ago with Dr. Gala Romney, do not have these reports anywhere in medical records   ESOPHAGOGASTRODUODENOSCOPY  06/02/2011  OXB:[id number] pill impaction as described above s/p dilation of a probable cervical esophageal web/bx abnormal esophageal and gastric mucosa. + H.pylori gastritis    ESOPHAGOGASTRODUODENOSCOPY (EGD) WITH PROPOFOL N/A 11/28/2019   Procedure: ESOPHAGOGASTRODUODENOSCOPY (EGD) WITH PROPOFOL;  Surgeon: Rogene Houston, MD;  [facility];  Service: Endoscopy;  Laterality: N/A;  1020   EYE SURGERY     Removed glass   HAND SURGERY Right    Left breast lumpectomy     Benign   LEFT HEART CATH AND CORONARY ANGIOGRAPHY N/A 10/25/2018   Procedure: LEFT HEART CATH AND CORONARY ANGIOGRAPHY;  Surgeon: Wellington Hampshire, MD;  Location: [facility];  Service:  Cardiovascular;  Laterality: N/A;   LEFT HEART CATHETERIZATION WITH CORONARY ANGIOGRAM N/A 07/03/2014   Procedure: LEFT HEART CATHETERIZATION WITH CORONARY ANGIOGRAM;  Surgeon: Belva Crome, MD;  Location: [facility] LAB;  Service: Cardiovascular;  Laterality: N/A;   POLYPECTOMY  01/05/2017   Procedure: POLYPECTOMY;  Surgeon: Rogene Houston, MD;  [facility];  Service: Endoscopy;;  colon   SHOULDER SURGERY Left      Family History  Problem Relation Age of Onset   Colon cancer Paternal Grandfather    Cancer Father    Cancer Maternal Uncle    Alzheimer's disease Paternal Aunt    Cirrhosis Maternal Uncle    Cancer Paternal Aunt        lung   Aneurysm Mother        brain   Heart disease Brother    Heart attack Brother    Mental illness Daughter    ADD / ADHD Daughter    Bipolar disorder Daughter    Mental illness Son    ADD / ADHD Son    Bipolar disorder Son    Mental illness Son    ADD / ADHD Son    Bipolar disorder Son     Social History   Social History Narrative   Patient lives at home with her husband but in the process of being separated now.On disability since age 15 yrs.   Social History   Tobacco Use   Smoking status: Former Smoker    Packs/day: 0.20    Years: 32.00    Pack years: 6.40    Types: Cigarettes, Cigars    Start date: 07/05/1983    Quit date: 04/03/2012    Years since quitting: 7.6   Smokeless tobacco: Never Used  Substance Use Topics   Alcohol use: No    Alcohol/week: 0.0 standard drinks    Current Meds  Medication Sig   ALPRAZolam (XANAX) 0.5 MG tablet Take 1 tablet (0.5 mg total) by mouth every morning. (Patient taking differently: Take 0.5 mg by mouth daily. )   amLODipine (NORVASC) 10 MG tablet Take 1 tablet (10 mg total) by mouth daily.   aspirin EC 81 MG tablet Take 2 tablets (162 mg total) by mouth 2 (two) times daily.   atorvastatin (LIPITOR) 40 MG tablet Take 1 tablet (40 mg total) by mouth  daily at 6 PM.   bismuth-metronidazole-tetracycline (PYLERA) 140-125-125 MG capsule Take 3 capsules by mouth 4 (four) times daily -  before meals and at bedtime.   Cholecalciferol (VITAMIN D3) 25 MCG (1000 UT) CHEW Chew 2,000 Units by mouth daily.   clopidogrel (PLAVIX) 75 MG tablet Take 1 tablet (75 mg total) by mouth daily.   escitalopram (LEXAPRO) 20 MG tablet Take 1 tablet (20 mg total) by mouth daily.   fluticasone (FLONASE) 50  MCG/ACT nasal spray Place 1 spray into both nostrils daily.   isosorbide mononitrate (IMDUR) 30 MG 24 hr tablet Take 3 tablets (90 mg total) by mouth daily.   Light Mineral Oil-Mineral Oil (RETAINE MGD OP) Apply 1 application to eye daily.    LINZESS 145 MCG CAPS capsule Take 1 capsule (145 mcg total) by mouth daily.   losartan (COZAAR) 100 MG tablet Take 1 tablet (100 mg total) by mouth daily.   Medical ID Bracelet MISC 1 Units by Does not apply route daily.   metoprolol tartrate (LOPRESSOR) 50 MG tablet Take 1.5 tablets (75 mg total) by mouth 2 (two) times daily.   nitroGLYCERIN (NITROSTAT) 0.4 MG SL tablet Place 1 tablet (0.4 mg total) under the tongue every 5 (five) minutes as needed for chest pain.   NONFORMULARY OR COMPOUNDED ITEM Inject 5 mg into the muscle every 7 (seven) days. Testosterone cypionate in olive  oil (25 mg/mL).  Dispense 5 mL vial.   pantoprazole (PROTONIX) 40 MG tablet Take 1 tablet (40 mg total) by mouth 2 (two) times daily before a meal.   potassium chloride 20 MEQ/15ML (10%) SOLN TAKE 15 MLS (20 MEQ TOTAL) BY MOUTH DAILY.   prednisoLONE acetate (PRED FORTE) 1 % ophthalmic suspension Place 1 drop into both eyes at bedtime.    Testosterone Propionate (FIRST-TESTOSTERONE MC) 2 % CREA Place 5 mg onto the skin daily.   thyroid (NP THYROID) 120 MG tablet Take 1 tablet (120 mg total) by mouth daily before breakfast.   WIXELA INHUB 100-50 MCG/DOSE AEPB INHALE 1 PUFF INTO THE LUNGS TWICE DAILY.   Current Facility-Administered  Medications for the 12/04/19 encounter (Office Visit) with [name], MD  Medication   NONFORMULARY OR COMPOUNDED ITEM 0.2 mL       Depression screen [facility] 2/9 08/15/2019 07/02/2019 03/14/2017  Decreased Interest 3 0 3  Down, Depressed, Hopeless 1 0 0  PHQ - 2 Score 4 0 3  Altered sleeping 1 - 3  Tired, decreased energy 3 - 3  Change in appetite 3 - 3  Feeling bad or failure about yourself  3 - 0  Trouble concentrating 1 - 0  Moving slowly or fidgety/restless 0 - 0  Suicidal thoughts 0 - 0  PHQ-9 Score 15 - 12  Difficult doing work/chores Very difficult - Very difficult  Some recent data might be hidden     Objective:   Today's Vitals: There were no vitals taken for this visit. Vitals with BMI 12/04/2019 11/28/2019 11/28/2019  Height 5\' 4"  - -  Weight 160 lbs - -  BMI 14.97 - -  Systolic 026 378 -  Diastolic 80 87 -  Pulse 84 50 50     Physical Exam  Vital signs are mostly stable today.     Assessment   1. Medication management       Tests ordered No orders of the defined types were placed in this encounter.    Plan: I went through each and every medication that she is on and recommended when she should take it whether it was going to be in the morning, lunch, evening or nighttime.  I spent about 10 minutes going through all her medication list today. She will follow-up as previously scheduled.  No orders of the defined types were placed in this encounter.   [name], MD

## 2019-12-05 ENCOUNTER — Ambulatory Visit (HOSPITAL_COMMUNITY)
Admission: RE | Admit: 2019-12-05 | Discharge: 2019-12-05 | Disposition: A | Discharge status: Home / Self Care | Payer: Medicare Other | Source: Ambulatory Visit | Attending: Internal Medicine | Admitting: Internal Medicine

## 2019-12-05 DIAGNOSIS — R1013 Epigastric pain: Secondary | ICD-10-CM | POA: Diagnosis not present

## 2019-12-05 DIAGNOSIS — G8929 Other chronic pain: Secondary | ICD-10-CM | POA: Insufficient documentation

## 2019-12-07 ENCOUNTER — Telehealth (INDEPENDENT_AMBULATORY_CARE_PROVIDER_SITE_OTHER): Payer: Self-pay | Admitting: *Deleted

## 2019-12-07 NOTE — Telephone Encounter (Signed)
A PA has been submitted to [facility] for the patient. Patient is allergic to the Pencillin and the preferred alternative , includes Amoxicillin. Patient can not take this.  The pharmacy has been made aware that a PA is being submitted. Patient will be made aware as we are.

## 2019-12-10 ENCOUNTER — Telehealth (INDEPENDENT_AMBULATORY_CARE_PROVIDER_SITE_OTHER): Payer: Self-pay | Admitting: *Deleted

## 2019-12-10 ENCOUNTER — Telehealth (INDEPENDENT_AMBULATORY_CARE_PROVIDER_SITE_OTHER): Payer: Self-pay | Admitting: Internal Medicine

## 2019-12-10 MED ORDER — ONDANSETRON 8 MG PO TBDP
8.0000 mg | ORAL_TABLET | Freq: Three times a day (TID) | ORAL | 1 refills | Status: DC | PRN
Start: 2019-12-10 — End: 2020-04-08

## 2019-12-10 NOTE — Telephone Encounter (Signed)
I called patient and discussed with her.  She is still having nausea despite Phenergan.  We will try sending Zofran to her pharmacy.  I also discussed ultrasound results with her.  She will let us know how she is doing after she completes the pylera

## 2019-12-10 NOTE — Telephone Encounter (Signed)
This has been addressed with the patient 

## 2019-12-10 NOTE — Telephone Encounter (Signed)
Patient was called to be made aware that her PYLERA had been approved.  She ask me about the results of her recent Ultrasound. I see that the results are in , and I told her that I wold ask the PA to take a look.  Patient states that she has lost so much weight, cannot eat , N&V. Still having pain in her side. She has not been able to get there medication and has not heard for [place] and states that she is so worried and would like to know something.

## 2019-12-10 NOTE — Telephone Encounter (Signed)
Noted  

## 2019-12-10 NOTE — Telephone Encounter (Signed)
Patient left message stating she wants to know something about her medication - would like a call back - [phone]

## 2019-12-11 ENCOUNTER — Ambulatory Visit (INDEPENDENT_AMBULATORY_CARE_PROVIDER_SITE_OTHER): Payer: Medicare Other

## 2019-12-14 DIAGNOSIS — H608X3 Other otitis externa, bilateral: Secondary | ICD-10-CM | POA: Diagnosis not present

## 2019-12-14 DIAGNOSIS — H6983 Other specified disorders of Eustachian tube, bilateral: Secondary | ICD-10-CM | POA: Diagnosis not present

## 2019-12-14 DIAGNOSIS — H903 Sensorineural hearing loss, bilateral: Secondary | ICD-10-CM | POA: Diagnosis not present

## 2019-12-18 ENCOUNTER — Ambulatory Visit (INDEPENDENT_AMBULATORY_CARE_PROVIDER_SITE_OTHER): Payer: Medicare Other

## 2019-12-24 DIAGNOSIS — E119 Type 2 diabetes mellitus without complications: Secondary | ICD-10-CM | POA: Diagnosis not present

## 2019-12-25 ENCOUNTER — Emergency Department (HOSPITAL_COMMUNITY): Payer: Medicare Other

## 2019-12-25 ENCOUNTER — Other Ambulatory Visit: Payer: Self-pay

## 2019-12-25 ENCOUNTER — Ambulatory Visit (INDEPENDENT_AMBULATORY_CARE_PROVIDER_SITE_OTHER): Payer: Medicare Other

## 2019-12-25 ENCOUNTER — Emergency Department (HOSPITAL_COMMUNITY)
Admission: EM | Admit: 2019-12-25 | Discharge: 2019-12-25 | Discharge status: Against Medical Advice | Payer: Medicare Other | Attending: Emergency Medicine | Admitting: Emergency Medicine

## 2019-12-25 ENCOUNTER — Encounter (HOSPITAL_COMMUNITY): Payer: Self-pay | Admitting: *Deleted

## 2019-12-25 DIAGNOSIS — R079 Chest pain, unspecified: Secondary | ICD-10-CM | POA: Insufficient documentation

## 2019-12-25 NOTE — ED Triage Notes (Signed)
Chest pain onset yesterday 

## 2019-12-28 DIAGNOSIS — H905 Unspecified sensorineural hearing loss: Secondary | ICD-10-CM | POA: Diagnosis not present

## 2019-12-31 ENCOUNTER — Other Ambulatory Visit: Payer: Self-pay | Admitting: *Deleted

## 2019-12-31 ENCOUNTER — Telehealth (INDEPENDENT_AMBULATORY_CARE_PROVIDER_SITE_OTHER): Payer: Self-pay

## 2019-12-31 DIAGNOSIS — I10 Essential (primary) hypertension: Secondary | ICD-10-CM

## 2019-12-31 DIAGNOSIS — I25119 Atherosclerotic heart disease of native coronary artery with unspecified angina pectoris: Secondary | ICD-10-CM

## 2019-12-31 MED ORDER — ISOSORBIDE MONONITRATE ER 30 MG PO TB24: 90.0000 mg | ORAL_TABLET | Freq: Every day | ORAL | 3 refills | Status: DC

## 2019-12-31 MED ORDER — ATORVASTATIN CALCIUM 40 MG PO TABS: 40.0000 mg | ORAL_TABLET | Freq: Every day | ORAL | 3 refills | Status: DC

## 2019-12-31 MED ORDER — NITROGLYCERIN 0.4 MG SL SUBL: 0.4000 mg | SUBLINGUAL_TABLET | SUBLINGUAL | 3 refills | Status: DC | PRN

## 2019-12-31 MED ORDER — LOSARTAN POTASSIUM 100 MG PO TABS: 100.0000 mg | ORAL_TABLET | Freq: Every day | ORAL | 3 refills | Status: DC

## 2019-12-31 MED ORDER — CLOPIDOGREL BISULFATE 75 MG PO TABS: 75.0000 mg | ORAL_TABLET | Freq: Every day | ORAL | 3 refills | Status: DC

## 2019-12-31 MED ORDER — METOPROLOL TARTRATE 50 MG PO TABS: 75.0000 mg | ORAL_TABLET | Freq: Two times a day (BID) | ORAL | 3 refills | Status: DC

## 2019-12-31 MED ORDER — POTASSIUM CHLORIDE 20 MEQ/15ML (10%) PO SOLN: 20.0000 meq | Freq: Every day | ORAL | 3 refills | Status: DC

## 2019-12-31 NOTE — Telephone Encounter (Signed)
PT STARTED SHE WAS NOT GOING BACK; HE ONLY CAME INTO TO NOTIFY THE PCP OF THE EVENTS AND WHY SHE DECIDE NOT TO GO BACK OR STAY.

## 2019-12-31 NOTE — Telephone Encounter (Signed)
That is a life-threateningly high blood pressure.  Please make sure that she realizes by not being evaluated and treated in the hospital her risk for stroke and heart attack are quite high.  Thus, I highly encouraged her to return back to the hospital.

## 2020-01-01 ENCOUNTER — Ambulatory Visit (INDEPENDENT_AMBULATORY_CARE_PROVIDER_SITE_OTHER): Payer: Medicare Other

## 2020-01-02 ENCOUNTER — Other Ambulatory Visit (INDEPENDENT_AMBULATORY_CARE_PROVIDER_SITE_OTHER): Payer: Self-pay | Admitting: Internal Medicine

## 2020-01-08 ENCOUNTER — Ambulatory Visit (INDEPENDENT_AMBULATORY_CARE_PROVIDER_SITE_OTHER): Payer: Medicare Other

## 2020-01-14 ENCOUNTER — Ambulatory Visit (INDEPENDENT_AMBULATORY_CARE_PROVIDER_SITE_OTHER): Payer: Medicare Other | Admitting: Internal Medicine

## 2020-01-14 ENCOUNTER — Encounter (INDEPENDENT_AMBULATORY_CARE_PROVIDER_SITE_OTHER): Payer: Self-pay | Admitting: Internal Medicine

## 2020-01-14 ENCOUNTER — Other Ambulatory Visit: Payer: Self-pay

## 2020-01-14 VITALS — BP 140/82 | HR 63 | Temp 97.7°F | Resp 18 | Ht 64.0 in | Wt 160.0 lb

## 2020-01-14 DIAGNOSIS — M25521 Pain in right elbow: Secondary | ICD-10-CM | POA: Diagnosis not present

## 2020-01-14 MED ORDER — PREDNISONE 20 MG PO TABS: 40.0000 mg | ORAL_TABLET | Freq: Every day | ORAL | 1 refills | Status: DC

## 2020-01-14 NOTE — Progress Notes (Signed)
Metrics: Intervention Frequency ACO  Documented Smoking Status Yearly  Screened one or more times in 24 months  Cessation Counseling or  Active cessation medication Past 24 months  Past 24 months   Guideline developer: UpToDate (See UpToDate for funding source) Date Released: 2014       Wellness Office Visit  Subjective:  Patient ID: Bethany Wang, female    DOB: [birth year]  Age: 54 y.o. MRN: [id number]  CC: Right elbow pain HPI  This patient comes in as an acute visit with right elbow pain.  Symptoms have been present for 3 days.  She says she was moving furniture which was quite heavy 3 days ago and she felt a "pop", after which she started to get pain in the right elbow with swelling.  She does not experience any pain anywhere else in her limbs.  The swelling is intermittent.  She has supported it by a elbow brace. Past Medical History:  Diagnosis Date  . Anemia   . Anxiety   . Bilateral breast cysts 12/30/2015  . Brain tumor ([place])   . Breast cancer ([place])    Left Breast Cancer  . Breast disorder   . BV (bacterial vaginosis) 09/09/2015  . Coronary atherosclerosis of native coronary artery    a. s/p DES to RCA in 2013 b. DES to LAD in 2014 c. cath in 04/2016 showing patent LAD stent with D2 jailed by LAD stent and CTO of RCA with left to right collaterals present  . Cyst of pharynx or nasopharynx    [name]  . Decreased libido 12/25/2018  . Depression   . Essential hypertension   . Falls    x 3-4 in past year  . GERD (gastroesophageal reflux disease)   . Headache   . History of hematuria   . Mixed hyperlipidemia   . Personal history of chemotherapy    Left Breast Cancer  . Personal history of radiation therapy    Left Breast Cancer  . Pre-diabetes   . PUD (peptic ulcer disease)   . Seizures ([place])   . Shingles 09/09/2015  . Stroke ([place])    12-2017 on Plavix, only deficit is headaches  . Suicide attempt ([place])    3 attempts in remote past  .  Trichimoniasis 08/20/2019   Treated 08/20/19   . Uterine cancer ([place])   . Vitamin D deficiency disease 04/03/2019   Past Surgical History:  Procedure Laterality Date  . ABDOMINAL HYSTERECTOMY    . BIOPSY  11/28/2019   Procedure: BIOPSY;  Surgeon: Rogene Houston, MD;  [facility];  Service: Endoscopy;;  antral, gastric body   . BLADDER SURGERY    . BREAST LUMPECTOMY Left   . BREAST LUMPECTOMY Right 02/24/2018   Procedure: RIGHT BREAST LUMPECTOMY ERAS PATHWAY;  Surgeon: Jovita Kussmaul, MD;  Location: [facility];  Service: General;  Laterality: Right;  . CARDIAC CATHETERIZATION N/A 05/10/2016   Procedure: Left Heart Cath and Coronary Angiography;  Surgeon: Belva Crome, MD;  Location: [facility];  Service: Cardiovascular;  Laterality: N/A;  . COLONOSCOPY  May 2010   Fleishman: normal rectum, internal hemorrhoids, , benign colonic polyp  . COLONOSCOPY N/A 01/05/2017   Procedure: COLONOSCOPY;  Surgeon: Rogene Houston, MD;  [facility];  Service: Endoscopy;  Laterality: N/A;  1:00  . ESOPHAGEAL DILATION  11/28/2019   Procedure: ESOPHAGEAL DILATION;  Surgeon: Rogene Houston, MD;  [facility];  Service: Endoscopy;;  .  ESOPHAGOGASTRODUODENOSCOPY     2001 Dr. Amedeo Plenty: distal esophagitis, small hiatal hernia,. Dr. Tamala Julian 2006? no records available currently, pt also reports  EGD a few years ago with Dr. Gala Romney, do not have these reports anywhere in medical records  . ESOPHAGOGASTRODUODENOSCOPY  06/02/2011   [id number] pill impaction as described above s/p dilation of a probable cervical esophageal web/bx abnormal esophageal and gastric mucosa. + H.pylori gastritis   . ESOPHAGOGASTRODUODENOSCOPY (EGD) WITH PROPOFOL N/A 11/28/2019   Procedure: ESOPHAGOGASTRODUODENOSCOPY (EGD) WITH PROPOFOL;  Surgeon: Rogene Houston, MD;  [facility];  Service: Endoscopy;  Laterality: N/A;  1020  . EYE SURGERY     Removed glass  . HAND SURGERY Right   . Left breast  lumpectomy     Benign  . LEFT HEART CATH AND CORONARY ANGIOGRAPHY N/A 10/25/2018   Procedure: LEFT HEART CATH AND CORONARY ANGIOGRAPHY;  Surgeon: Wellington Hampshire, MD;  Location: [facility];  Service: Cardiovascular;  Laterality: N/A;  . LEFT HEART CATHETERIZATION WITH CORONARY ANGIOGRAM N/A 07/03/2014   Procedure: LEFT HEART CATHETERIZATION WITH CORONARY ANGIOGRAM;  Surgeon: Belva Crome, MD;  Location: [facility];  Service: Cardiovascular;  Laterality: N/A;  . POLYPECTOMY  01/05/2017   Procedure: POLYPECTOMY;  Surgeon: Rogene Houston, MD;  [facility];  Service: Endoscopy;;  colon  . SHOULDER SURGERY Left      Family History  Problem Relation Age of Onset  . Colon cancer Paternal Grandfather   . Cancer Father   . Cancer Maternal Uncle   . Alzheimer's disease Paternal Aunt   . Cirrhosis Maternal Uncle   . Cancer Paternal Aunt        lung  . Aneurysm Mother        brain  . Heart disease Brother   . Heart attack Brother   . Mental illness Daughter   . ADD / ADHD Daughter   . Bipolar disorder Daughter   . Mental illness Son   . ADD / ADHD Son   . Bipolar disorder Son   . Mental illness Son   . ADD / ADHD Son   . Bipolar disorder Son     Social History   Social History Narrative   Patient lives at home with her husband but in the process of being separated now.On disability since age 74 yrs.   Social History   Tobacco Use  . Smoking status: Former Smoker    Packs/day: 0.20    Years: 32.00    Pack years: 6.40    Types: Cigarettes, Cigars    Start date: 07/05/1983    Quit date: 04/03/2012    Years since quitting: 7.7  . Smokeless tobacco: Never Used  Substance Use Topics  . Alcohol use: No    Alcohol/week: 0.0 standard drinks    Current Meds  Medication Sig  . ALPRAZolam (XANAX) 0.5 MG tablet Take 1 tablet (0.5 mg total) by mouth every morning. (Patient taking differently: Take 0.5 mg by mouth daily. )  . amLODipine (NORVASC) 10 MG tablet  Take 1 tablet (10 mg total) by mouth daily.  [name] aspirin EC 81 MG tablet Take 2 tablets (162 mg total) by mouth 2 (two) times daily.  [name] atorvastatin (LIPITOR) 40 MG tablet Take 1 tablet (40 mg total) by mouth daily at 6 PM.  . bismuth-metronidazole-tetracycline (PYLERA) 140-125-125 MG capsule Take 3 capsules by mouth 4 (four) times daily -  before meals and at bedtime.  . Cholecalciferol (VITAMIN D3)  25 MCG (1000 UT) CHEW Chew 2,000 Units by mouth daily.  . clopidogrel (PLAVIX) 75 MG tablet Take 1 tablet (75 mg total) by mouth daily.  [name] escitalopram (LEXAPRO) 20 MG tablet Take 1 tablet (20 mg total) by mouth daily.  . fluticasone (FLONASE) 50 MCG/ACT nasal spray Place 1 spray into both nostrils daily.  . Fluticasone-Salmeterol (ADVAIR) 100-50 MCG/DOSE AEPB INHALE ONE (1) PUFF BY MOUTH TWICE DAILY  . isosorbide mononitrate (IMDUR) 30 MG 24 hr tablet Take 3 tablets (90 mg total) by mouth daily.  Sunday Corn Mineral Oil-Mineral Oil (RETAINE MGD OP) Apply 1 application to eye daily.   [name] LINZESS 145 MCG CAPS capsule Take 1 capsule (145 mcg total) by mouth daily.  [name] losartan (COZAAR) 100 MG tablet Take 1 tablet (100 mg total) by mouth daily.  . Medical ID Bracelet MISC 1 Units by Does not apply route daily.  . metoprolol tartrate (LOPRESSOR) 50 MG tablet Take 1.5 tablets (75 mg total) by mouth 2 (two) times daily.  . nitroGLYCERIN (NITROSTAT) 0.4 MG SL tablet Place 1 tablet (0.4 mg total) under the tongue every 5 (five) minutes as needed for chest pain.  . NONFORMULARY OR COMPOUNDED ITEM Inject 5 mg into the muscle every 7 (seven) days. Testosterone cypionate in olive  oil (25 mg/mL).  Dispense 5 mL vial.  . ondansetron (ZOFRAN ODT) 8 MG disintegrating tablet Take 1 tablet (8 mg total) by mouth every 8 (eight) hours as needed for nausea or vomiting.  . pantoprazole (PROTONIX) 40 MG tablet Take 1 tablet (40 mg total) by mouth 2 (two) times daily before a meal.  . potassium chloride 20 MEQ/15ML (10%) SOLN Take  15 mLs (20 mEq total) by mouth daily.  . prednisoLONE acetate (PRED FORTE) 1 % ophthalmic suspension Place 1 drop into both eyes at bedtime.   . Testosterone Propionate (FIRST-TESTOSTERONE MC) 2 % CREA Place 5 mg onto the skin daily.  [name] thyroid (NP THYROID) 120 MG tablet Take 1 tablet (120 mg total) by mouth daily before breakfast.   Current Facility-Administered Medications for the 01/14/20 encounter (Office Visit) with [name], MD  Medication  . NONFORMULARY OR COMPOUNDED ITEM 0.2 mL      Depression screen [facility] 2/9 08/15/2019 07/02/2019 03/14/2017  Decreased Interest 3 0 3  Down, Depressed, Hopeless 1 0 0  PHQ - 2 Score 4 0 3  Altered sleeping 1 - 3  Tired, decreased energy 3 - 3  Change in appetite 3 - 3  Feeling bad or failure about yourself  3 - 0  Trouble concentrating 1 - 0  Moving slowly or fidgety/restless 0 - 0  Suicidal thoughts 0 - 0  PHQ-9 Score 15 - 12  Difficult doing work/chores Very difficult - Very difficult  Some recent data might be hidden     Objective:   Today's Vitals: BP 140/82 (BP Location: Left Arm, Patient Position: Sitting, Cuff Size: Normal)   Pulse 63   Temp 97.7 F (36.5 C)   Resp 18   Ht 5\' 4"  (1.626 m)   Wt 160 lb (72.6 kg)   SpO2 98%   BMI 27.46 kg/m  Vitals with BMI 01/14/2020 12/04/2019 12/04/2019  Height 5\' 4"  (No Data) 5\' 4"   Weight 160 lbs (No Data) 160 lbs  BMI 01.09 - 32.35  Systolic 573 (No Data) 220  Diastolic 82 (No Data) 80  Pulse 63 - 84     Physical Exam  Examination of the right elbow shows some  swelling and localized tenderness.     Assessment   1. Right elbow pain       Tests ordered Orders Placed This Encounter  Procedures  . Ambulatory referral to Orthopedic Surgery     Plan: 1. I am not sure as to the cause of the right elbow pain.  I wonder if she has some element of dislocation or tendinitis.  I am going to treat her empirically with dexamethasone 4 mg intramuscular in the office and a  short course of prednisone.  However, I will get her to see the orthopedic surgeon as soon as possible and we will make that referral now.   Meds ordered this encounter  Medications  . predniSONE (DELTASONE) 20 MG tablet    Sig: Take 2 tablets (40 mg total) by mouth daily with breakfast.    Dispense:  10 tablet    Refill:  1    Makell Drohan Luther Parody, MD

## 2020-01-15 ENCOUNTER — Ambulatory Visit (INDEPENDENT_AMBULATORY_CARE_PROVIDER_SITE_OTHER): Payer: Medicare Other

## 2020-01-17 ENCOUNTER — Ambulatory Visit: Payer: Self-pay

## 2020-01-17 ENCOUNTER — Other Ambulatory Visit: Payer: Self-pay

## 2020-01-17 ENCOUNTER — Ambulatory Visit (INDEPENDENT_AMBULATORY_CARE_PROVIDER_SITE_OTHER): Payer: Medicare Other | Admitting: Family Medicine

## 2020-01-17 ENCOUNTER — Encounter: Payer: Self-pay | Admitting: Family Medicine

## 2020-01-17 DIAGNOSIS — M25521 Pain in right elbow: Secondary | ICD-10-CM

## 2020-01-17 DIAGNOSIS — M24821 Other specific joint derangements of right elbow, not elsewhere classified: Secondary | ICD-10-CM | POA: Diagnosis not present

## 2020-01-17 NOTE — Progress Notes (Signed)
Office Visit Note   Patient: ADAJA Wang           Date of Birth: [birth year]           MRN: [id number] Visit Date: 01/17/2020 Requested by: Bethany Albee, MD [address] [place],  [place] [address] PCP: Bethany Albee, MD  Subjective: Chief Complaint  Patient presents with  . Right Elbow - Pain    HPI: She is here with right elbow pain. She is right-hand dominant. This past Friday she was packing some boxes to move to a new house. She felt and heard something pop in her elbow. It did not hurt, she was able to continue with her usual activities but then later that night her elbow became severely painful. The next day her elbow popped again. She tried a variety of over-the-counter treatments but nothing was helping. She went to her PCP on Monday and was given intramuscular steroid injection but so far it has not helped. She is unable to straighten her elbow. No history of gout.               ROS: No numbness or tingling. All other systems were reviewed and are negative.  Objective: Vital Signs: There were no vitals taken for this visit.  Physical Exam:  General:  Alert and oriented, in no acute distress. Pulm:  Breathing unlabored. Psy:  Normal mood, congruent affect. Skin: No erythema. Right elbow: She holds it in 90 degrees of flexion. She has tenderness around the lateral elbow. There is no warmth or erythema. Slight effusion. Pain with wrist extension against resistance. Neurovascularly intact.  Imaging: XR Elbow 2 Views Right  Result Date: 01/17/2020 X-rays of the right elbow reveal normal anatomic alignment. She has early DJD. No obvious loose body, no sign of fracture.   Assessment & Plan: 1. Right elbow locking, suspicious for loose body -Discussed options with her and elected to do an intra-articular injection of steroid today. We will also order MRI scan. If the steroid resolves her symptoms, we will cancel the MRI.     Procedures: Right elbow  injection: After sterile prep with Betadine, injected 4 cc 1% lidocaine without epinephrine and 6 mg betamethasone from lateral approach intra-articular.   PMFS History: Patient Active Problem List   Diagnosis Date Noted  . Trichimoniasis 08/20/2019  . Screening examination for STD (sexually transmitted disease) 08/15/2019  . Vaginal itching 08/15/2019  . Hypertension 08/15/2019  . Vitamin D deficiency disease 04/03/2019  . Decreased libido 12/25/2018  . Angina pectoris ([place]) 10/25/2018  . Palpitations 06/06/2018  . Chest pain 07/13/2017  . Nonspecific chest pain 07/13/2017  . Mixed hyperlipidemia 07/13/2017  . Precordial chest pain   . Hypertensive urgency 06/15/2016  . Unstable angina pectoris ([place]) 06/15/2016  . Unstable angina ([place]) 06/15/2016  . Bilateral breast cysts 12/30/2015  . Vaginal discharge 09/09/2015  . BV (bacterial vaginosis) 09/09/2015  . Shingles 09/09/2015  . History of breast cancer 09/09/2015  . Syncope and collapse 08/08/2015  . Migraine with aura and with status migrainosus, not intractable 08/08/2015  . Chronic low back pain 08/08/2015  . Insomnia 08/08/2015  . Anxiety state 08/08/2015  . Panic attacks 08/08/2015  . Anemia, iron deficiency 08/08/2015  . Pica 08/08/2015  . Angina decubitus ([place]) 07/03/2014  . 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 Encounter for gynecological examination with Papanicolaou smear of cervix 02/04/2014  . Syncope 12/14/2013  . Cervical disc disorder with radiculopathy of cervical region 02/19/2013  .  H/O rotator cuff surgery 02/19/2013  . Dyspareunia 02/01/2013  . Left shoulder pain 01/24/2013  . Rotator cuff tear 01/24/2013  . Radicular pain 01/24/2013  . Labral tear of shoulder 01/24/2013  . Complex regional pain syndrome of upper extremity 01/24/2013  . Herniated disc, cervical 01/24/2013  . Nausea and vomiting 01/18/2013  . Rectal bleeding 01/18/2013  . Edema of left foot 10/16/2012  . Coronary  atherosclerosis of native coronary artery   . Essential hypertension, benign   . HLD (hyperlipidemia)   . Tobacco abuse    Past Medical History:  Diagnosis Date  . Anemia   . Anxiety   . Bilateral breast cysts 12/30/2015  . Brain tumor ([place])   . Breast cancer ([place])    Left Breast Cancer  . Breast disorder   . BV (bacterial vaginosis) 09/09/2015  . Coronary atherosclerosis of native coronary artery    a. s/p DES to RCA in 2013 b. DES to LAD in 2014 c. cath in 04/2016 showing patent LAD stent with D2 jailed by LAD stent and CTO of RCA with left to right collaterals present  . Cyst of pharynx or nasopharynx    [name]  . Decreased libido 12/25/2018  . Depression   . Essential hypertension   . Falls    x 3-4 in past year  . GERD (gastroesophageal reflux disease)   . Headache   . History of hematuria   . Mixed hyperlipidemia   . Personal history of chemotherapy    Left Breast Cancer  . Personal history of radiation therapy    Left Breast Cancer  . Pre-diabetes   . PUD (peptic ulcer disease)   . Seizures ([place])   . Shingles 09/09/2015  . Stroke ([place])    12-2017 on Plavix, only deficit is headaches  . Suicide attempt ([place])    3 attempts in remote past  . Trichimoniasis 08/20/2019   Treated 08/20/19   . Uterine cancer ([place])   . Vitamin D deficiency disease 04/03/2019    Family History  Problem Relation Age of Onset  . Colon cancer Paternal Grandfather   . Cancer Father   . Cancer Maternal Uncle   . Alzheimer's disease Paternal Aunt   . Cirrhosis Maternal Uncle   . Cancer Paternal Aunt        lung  . Aneurysm Mother        brain  . Heart disease Brother   . Heart attack Brother   . Mental illness Daughter   . ADD / ADHD Daughter   . Bipolar disorder Daughter   . Mental illness Son   . ADD / ADHD Son   . Bipolar disorder Son   . Mental illness Son   . ADD / ADHD Son   . Bipolar disorder Son     Past Surgical History:  Procedure Laterality Date    . ABDOMINAL HYSTERECTOMY    . BIOPSY  11/28/2019   Procedure: BIOPSY;  Surgeon: Bethany Houston, MD;  [facility];  Service: Endoscopy;;  antral, gastric body   . BLADDER SURGERY    . BREAST LUMPECTOMY Left   . BREAST LUMPECTOMY Right 02/24/2018   Procedure: RIGHT BREAST LUMPECTOMY ERAS PATHWAY;  Surgeon: Jovita Kussmaul, MD;  Location: [facility];  Service: General;  Laterality: Right;  . CARDIAC CATHETERIZATION N/A 05/10/2016   Procedure: Left Heart Cath and Coronary Angiography;  Surgeon: Belva Crome, MD;  Location: [place] [facility];  Service: Cardiovascular;  Laterality: N/A;  . COLONOSCOPY  May 2010   Fleishman: normal rectum, internal hemorrhoids, , benign colonic polyp  . COLONOSCOPY N/A 01/05/2017   Procedure: COLONOSCOPY;  Surgeon: Bethany Houston, MD;  [facility];  Service: Endoscopy;  Laterality: N/A;  1:00  . ESOPHAGEAL DILATION  11/28/2019   Procedure: ESOPHAGEAL DILATION;  Surgeon: Bethany Houston, MD;  [facility];  Service: Endoscopy;;  . ESOPHAGOGASTRODUODENOSCOPY     2001 Dr. Amedeo Plenty: distal esophagitis, small hiatal hernia,. Dr. Tamala Julian 2006? no records available currently, pt also reports  EGD a few years ago with Dr. Gala Romney, do not have these reports anywhere in medical records  . ESOPHAGOGASTRODUODENOSCOPY  06/02/2011   [id number]:[id number] pill impaction as described above s/p dilation of a probable cervical esophageal web/bx abnormal esophageal and gastric mucosa. + H.pylori gastritis   . ESOPHAGOGASTRODUODENOSCOPY (EGD) WITH PROPOFOL N/A 11/28/2019   Procedure: ESOPHAGOGASTRODUODENOSCOPY (EGD) WITH PROPOFOL;  Surgeon: Bethany Houston, MD;  [facility];  Service: Endoscopy;  Laterality: N/A;  1020  . EYE SURGERY     Removed glass  . HAND SURGERY Right   . Left breast lumpectomy     Benign  . LEFT HEART CATH AND CORONARY ANGIOGRAPHY N/A 10/25/2018   Procedure: LEFT HEART CATH AND CORONARY ANGIOGRAPHY;  Surgeon: Wellington Hampshire, MD;   Location: [place] [facility];  Service: Cardiovascular;  Laterality: N/A;  . LEFT HEART CATHETERIZATION WITH CORONARY ANGIOGRAM N/A 07/03/2014   Procedure: LEFT HEART CATHETERIZATION WITH CORONARY ANGIOGRAM;  Surgeon: Belva Crome, MD;  Location: [facility];  Service: Cardiovascular;  Laterality: N/A;  . POLYPECTOMY  01/05/2017   Procedure: POLYPECTOMY;  Surgeon: Bethany Houston, MD;  [facility];  Service: Endoscopy;;  colon  . SHOULDER SURGERY Left    Social History   Occupational History  . Occupation: Retired    Comment: disability  Tobacco Use  . Smoking status: Former Smoker    Packs/day: 0.20    Years: 32.00    Pack years: 6.40    Types: Cigarettes, Cigars    Start date: 07/05/1983    Quit date: 04/03/2012    Years since quitting: 7.7  . Smokeless tobacco: Never Used  Vaping Use  . Vaping Use: Never used  Substance and Sexual Activity  . Alcohol use: No    Alcohol/week: 0.0 standard drinks  . Drug use: No  . Sexual activity: Yes    Birth control/protection: Surgical    Comment: hyst

## 2020-01-17 NOTE — Progress Notes (Signed)
No known injury   Moving boxes..packing to move Friday of last week Heard something pop-didn't hurt at the time Saturday morning is when pain started Started swelling-tried heat and ice but didn't help Sunday woke up still hurt and felt another pop Tried Nsaids and rest but the swelling came back Didn't take prednisone back because of other medications she is taking

## 2020-01-21 ENCOUNTER — Ambulatory Visit (INDEPENDENT_AMBULATORY_CARE_PROVIDER_SITE_OTHER): Payer: Medicare Other

## 2020-01-21 ENCOUNTER — Other Ambulatory Visit: Payer: Self-pay

## 2020-01-21 DIAGNOSIS — Z23 Encounter for immunization: Secondary | ICD-10-CM

## 2020-01-21 NOTE — Progress Notes (Signed)
Pt given Reg flu dose to left arm;tolerated well.

## 2020-01-22 ENCOUNTER — Ambulatory Visit (INDEPENDENT_AMBULATORY_CARE_PROVIDER_SITE_OTHER): Payer: Medicare Other

## 2020-01-22 DIAGNOSIS — E119 Type 2 diabetes mellitus without complications: Secondary | ICD-10-CM | POA: Diagnosis not present

## 2020-01-23 ENCOUNTER — Emergency Department (HOSPITAL_COMMUNITY): Payer: Medicare Other

## 2020-01-23 ENCOUNTER — Telehealth (INDEPENDENT_AMBULATORY_CARE_PROVIDER_SITE_OTHER): Payer: Self-pay | Admitting: Internal Medicine

## 2020-01-23 ENCOUNTER — Emergency Department (HOSPITAL_COMMUNITY)
Admission: EM | Admit: 2020-01-23 | Discharge: 2020-01-23 | Discharge status: Against Medical Advice | Payer: Medicare Other | Attending: Emergency Medicine | Admitting: Emergency Medicine

## 2020-01-23 ENCOUNTER — Other Ambulatory Visit: Payer: Self-pay

## 2020-01-23 ENCOUNTER — Encounter (HOSPITAL_COMMUNITY): Payer: Self-pay

## 2020-01-23 DIAGNOSIS — K7689 Other specified diseases of liver: Secondary | ICD-10-CM | POA: Diagnosis not present

## 2020-01-23 DIAGNOSIS — R109 Unspecified abdominal pain: Secondary | ICD-10-CM | POA: Diagnosis present

## 2020-01-23 DIAGNOSIS — Z79899 Other long term (current) drug therapy: Secondary | ICD-10-CM | POA: Diagnosis not present

## 2020-01-23 DIAGNOSIS — I10 Essential (primary) hypertension: Secondary | ICD-10-CM | POA: Insufficient documentation

## 2020-01-23 DIAGNOSIS — Z87891 Personal history of nicotine dependence: Secondary | ICD-10-CM | POA: Diagnosis not present

## 2020-01-23 DIAGNOSIS — I2510 Atherosclerotic heart disease of native coronary artery without angina pectoris: Secondary | ICD-10-CM | POA: Diagnosis not present

## 2020-01-23 DIAGNOSIS — Z853 Personal history of malignant neoplasm of breast: Secondary | ICD-10-CM | POA: Insufficient documentation

## 2020-01-23 DIAGNOSIS — Z8542 Personal history of malignant neoplasm of other parts of uterus: Secondary | ICD-10-CM | POA: Diagnosis not present

## 2020-01-23 DIAGNOSIS — R1031 Right lower quadrant pain: Secondary | ICD-10-CM | POA: Diagnosis not present

## 2020-01-23 DIAGNOSIS — R1011 Right upper quadrant pain: Secondary | ICD-10-CM | POA: Diagnosis not present

## 2020-01-23 DIAGNOSIS — Z7982 Long term (current) use of aspirin: Secondary | ICD-10-CM | POA: Diagnosis not present

## 2020-01-23 LAB — COMPREHENSIVE METABOLIC PANEL
ALT: 49 U/L — ABNORMAL HIGH (ref 0–44)
AST: 37 U/L (ref 15–41)
Albumin: 3.9 g/dL (ref 3.5–5.0)
Alkaline Phosphatase: 132 U/L — ABNORMAL HIGH (ref 38–126)
Anion gap: 8 (ref 5–15)
BUN: 8 mg/dL (ref 6–20)
CO2: 28 mmol/L (ref 22–32)
Calcium: 9.1 mg/dL (ref 8.9–10.3)
Chloride: 102 mmol/L (ref 98–111)
Creatinine, Ser: 0.74 mg/dL (ref 0.44–1.00)
GFR, Estimated: >60 mL/min (ref >60)
Glucose, Bld: 131 mg/dL — ABNORMAL HIGH (ref 70–99)
Potassium: 3.4 mmol/L — ABNORMAL LOW (ref 3.5–5.1)
Sodium: 138 mmol/L (ref 135–145)
Total Bilirubin: 0.6 mg/dL (ref 0.3–1.2)
Total Protein: 7.2 g/dL (ref 6.5–8.1)

## 2020-01-23 LAB — CBC
HCT: 42 % (ref 36.0–46.0)
Hemoglobin: 13.4 g/dL (ref 12.0–15.0)
MCH: 26.9 pg (ref 26.0–34.0)
MCHC: 31.9 g/dL (ref 30.0–36.0)
MCV: 84.2 fL (ref 80.0–100.0)
Platelets: 263 10*3/uL (ref 150–400)
RBC: 4.99 MIL/uL (ref 3.87–5.11)
RDW: 14.6 % (ref 11.5–15.5)
WBC: 8.8 10*3/uL (ref 4.0–10.5)
nRBC: 0 % (ref 0.0–0.2)

## 2020-01-23 LAB — LIPASE, BLOOD: Lipase: 32 U/L (ref 11–51)

## 2020-01-23 MED ORDER — HYDROMORPHONE HCL 1 MG/ML IJ SOLN: 0.5000 mg | Freq: Once | INTRAMUSCULAR | Status: DC

## 2020-01-23 MED ORDER — IOHEXOL 300 MG/ML  SOLN: 100.0000 mL | Freq: Once | INTRAMUSCULAR | Status: DC | PRN

## 2020-01-23 NOTE — Telephone Encounter (Signed)
Discussed with Dr.Rehman   He recommended  that if the pain is severe patient may consider to go to the ED for further evaluation Patient was called again to ask additional questions. She now states that the pain is the upper right quadrant. Ms. Lore agrees that she is going to come to [facility] ED for a further evaluation.  Dr.Rehman was made aware.

## 2020-01-23 NOTE — ED Provider Notes (Signed)
[facility] EMERGENCY DEPARTMENT Provider Note   CSN: [id number] Arrival date & time: 01/23/20  1436     History Chief Complaint  Patient presents with  . Abdominal Pain    Bethany Wang is a 54 y.o. female.  HPI Patient is a 54 year old female with an extensive medical history as noted below.  She states about 4 days ago she began experiencing waxing and waning right-sided abdominal pain that appears to be worst after eating.  States that she has been eating mashed potatoes only, due to the pain.  She reports a history of similar symptoms and is followed by Dr. Laural Golden with gastroenterology.  She states that she has been compliant with her Linzess and has not been experiencing any constipation.  No nausea or vomiting.  No fevers or chills. No vaginal discharge, dysuria, hematuria.  Patient has no other physical complaints at this time.     Past Medical History:  Diagnosis Date  . Anemia   . Anxiety   . Bilateral breast cysts 12/30/2015  . Brain tumor ([place])   . Breast cancer ([place])    Left Breast Cancer  . Breast disorder   . BV (bacterial vaginosis) 09/09/2015  . Coronary atherosclerosis of native coronary artery    a. s/p DES to RCA in 2013 b. DES to LAD in 2014 c. cath in 04/2016 showing patent LAD stent with D2 jailed by LAD stent and CTO of RCA with left to right collaterals present  . Cyst of pharynx or nasopharynx    [name]  . Decreased libido 12/25/2018  . Depression   . Essential hypertension   . Falls    x 3-4 in past year  . GERD (gastroesophageal reflux disease)   . Headache   . History of hematuria   . Mixed hyperlipidemia   . Personal history of chemotherapy    Left Breast Cancer  . Personal history of radiation therapy    Left Breast Cancer  . Pre-diabetes   . PUD (peptic ulcer disease)   . Seizures ([place])   . Shingles 09/09/2015  . Stroke ([place])    12-2017 on Plavix, only deficit is headaches  . Suicide attempt ([place])    3  attempts in remote past  . Trichimoniasis 08/20/2019   Treated 08/20/19   . Uterine cancer ([place])   . Vitamin D deficiency disease 04/03/2019    Patient Active Problem List   Diagnosis Date Noted  . Trichimoniasis 08/20/2019  . Screening examination for STD (sexually transmitted disease) 08/15/2019  . Vaginal itching 08/15/2019  . Hypertension 08/15/2019  . Vitamin D deficiency disease 04/03/2019  . Decreased libido 12/25/2018  . Angina pectoris ([place]) 10/25/2018  . Palpitations 06/06/2018  . Chest pain 07/13/2017  . Nonspecific chest pain 07/13/2017  . Mixed hyperlipidemia 07/13/2017  . Precordial chest pain   . Hypertensive urgency 06/15/2016  . Unstable angina pectoris ([place]) 06/15/2016  . Unstable angina ([place]) 06/15/2016  . Bilateral breast cysts 12/30/2015  . Vaginal discharge 09/09/2015  . BV (bacterial vaginosis) 09/09/2015  . Shingles 09/09/2015  . History of breast cancer 09/09/2015  . Syncope and collapse 08/08/2015  . Migraine with aura and with status migrainosus, not intractable 08/08/2015  . Chronic low back pain 08/08/2015  . Insomnia 08/08/2015  . Anxiety state 08/08/2015  . Panic attacks 08/08/2015  . Anemia, iron deficiency 08/08/2015  . Pica 08/08/2015  . Angina decubitus ([place]) 07/03/2014  . 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 Encounter for gynecological  examination with Papanicolaou smear of cervix 02/04/2014  . Syncope 12/14/2013  . Cervical disc disorder with radiculopathy of cervical region 02/19/2013  . H/O rotator cuff surgery 02/19/2013  . Dyspareunia 02/01/2013  . Left shoulder pain 01/24/2013  . Rotator cuff tear 01/24/2013  . Radicular pain 01/24/2013  . Labral tear of shoulder 01/24/2013  . Complex regional pain syndrome of upper extremity 01/24/2013  . Herniated disc, cervical 01/24/2013  . Nausea and vomiting 01/18/2013  . Rectal bleeding 01/18/2013  . Edema of left foot 10/16/2012  . Coronary atherosclerosis of native coronary  artery   . Essential hypertension, benign   . HLD (hyperlipidemia)   . Tobacco abuse     Past Surgical History:  Procedure Laterality Date  . ABDOMINAL HYSTERECTOMY    . BIOPSY  11/28/2019   Procedure: BIOPSY;  Surgeon: Rogene Houston, MD;  [facility];  Service: Endoscopy;;  antral, gastric body   . BLADDER SURGERY    . BREAST LUMPECTOMY Left   . BREAST LUMPECTOMY Right 02/24/2018   Procedure: RIGHT BREAST LUMPECTOMY ERAS PATHWAY;  Surgeon: Jovita Kussmaul, MD;  Location: [place];  Service: General;  Laterality: Right;  . CARDIAC CATHETERIZATION N/A 05/10/2016   Procedure: Left Heart Cath and Coronary Angiography;  Surgeon: Belva Crome, MD;  Location: [place] [facility];  Service: Cardiovascular;  Laterality: N/A;  . COLONOSCOPY  May 2010   Fleishman: normal rectum, internal hemorrhoids, , benign colonic polyp  . COLONOSCOPY N/A 01/05/2017   Procedure: COLONOSCOPY;  Surgeon: Rogene Houston, MD;  [facility];  Service: Endoscopy;  Laterality: N/A;  1:00  . ESOPHAGEAL DILATION  11/28/2019   Procedure: ESOPHAGEAL DILATION;  Surgeon: Rogene Houston, MD;  [facility];  Service: Endoscopy;;  . ESOPHAGOGASTRODUODENOSCOPY     2001 Dr. Amedeo Plenty: distal esophagitis, small hiatal hernia,. Dr. Tamala Julian 2006? no records available currently, pt also reports  EGD a few years ago with Dr. Gala Romney, do not have these reports anywhere in medical records  . ESOPHAGOGASTRODUODENOSCOPY  06/02/2011   IRJ:JOACZY pill impaction as described above s/p dilation of a probable cervical esophageal web/bx abnormal esophageal and gastric mucosa. + H.pylori gastritis   . ESOPHAGOGASTRODUODENOSCOPY (EGD) WITH PROPOFOL N/A 11/28/2019   Procedure: ESOPHAGOGASTRODUODENOSCOPY (EGD) WITH PROPOFOL;  Surgeon: Rogene Houston, MD;  [facility];  Service: Endoscopy;  Laterality: N/A;  1020  . EYE SURGERY     Removed glass  . HAND SURGERY Right   . Left breast lumpectomy     Benign  .  LEFT HEART CATH AND CORONARY ANGIOGRAPHY N/A 10/25/2018   Procedure: LEFT HEART CATH AND CORONARY ANGIOGRAPHY;  Surgeon: Wellington Hampshire, MD;  Location: [place] [facility];  Service: Cardiovascular;  Laterality: N/A;  . LEFT HEART CATHETERIZATION WITH CORONARY ANGIOGRAM N/A 07/03/2014   Procedure: LEFT HEART CATHETERIZATION WITH CORONARY ANGIOGRAM;  Surgeon: Belva Crome, MD;  Location: [facility] CATH LAB;  Service: Cardiovascular;  Laterality: N/A;  . POLYPECTOMY  01/05/2017   Procedure: POLYPECTOMY;  Surgeon: Rogene Houston, MD;  [facility];  Service: Endoscopy;;  colon  . SHOULDER SURGERY Left      OB History    Gravida  4   Para  3   Term      Preterm      AB  1   Living  3     SAB  1   TAB      Ectopic  Multiple      Live Births              Family History  Problem Relation Age of Onset  . Colon cancer Paternal Grandfather   . Cancer Father   . Cancer Maternal Uncle   . Alzheimer's disease Paternal Aunt   . Cirrhosis Maternal Uncle   . Cancer Paternal Aunt        lung  . Aneurysm Mother        brain  . Heart disease Brother   . Heart attack Brother   . Mental illness Daughter   . ADD / ADHD Daughter   . Bipolar disorder Daughter   . Mental illness Son   . ADD / ADHD Son   . Bipolar disorder Son   . Mental illness Son   . ADD / ADHD Son   . Bipolar disorder Son     Social History   Tobacco Use  . Smoking status: Former Smoker    Packs/day: 0.20    Years: 32.00    Pack years: 6.40    Types: Cigarettes, Cigars    Start date: 07/05/1983    Quit date: 04/03/2012    Years since quitting: 7.8  . Smokeless tobacco: Never Used  Vaping Use  . Vaping Use: Never used  Substance Use Topics  . Alcohol use: No    Alcohol/week: 0.0 standard drinks  . Drug use: No    Home Medications Prior to Admission medications   Medication Sig Start Date End Date Taking? Authorizing Provider  ALPRAZolam Duanne Moron) 0.5 MG tablet Take 1 tablet (0.5 mg  total) by mouth every morning. Patient taking differently: Take 0.5 mg by mouth daily.  05/08/19   [name], MD  amLODipine (NORVASC) 10 MG tablet Take 1 tablet (10 mg total) by mouth daily. 11/19/19   [name], MD  aspirin EC 81 MG tablet Take 2 tablets (162 mg total) by mouth 2 (two) times daily. 12/01/19   Rogene Houston, MD  atorvastatin (LIPITOR) 40 MG tablet Take 1 tablet (40 mg total) by mouth daily at 6 PM. 12/31/19   Satira Sark, MD  bismuth-metronidazole-tetracycline [facility]) [phone] MG capsule Take 3 capsules by mouth 4 (four) times daily -  before meals and at bedtime. 11/30/19   Rogene Houston, MD  Cholecalciferol (VITAMIN D3) 25 MCG (1000 UT) CHEW Chew 2,000 Units by mouth daily. 11/19/19   [name], MD  clopidogrel (PLAVIX) 75 MG tablet Take 1 tablet (75 mg total) by mouth daily. 12/31/19   Satira Sark, MD  escitalopram (LEXAPRO) 20 MG tablet Take 1 tablet (20 mg total) by mouth daily. 11/19/19   [name], MD  fluticasone (FLONASE) 50 MCG/ACT nasal spray Place 1 spray into both nostrils daily. 03/13/19   Kendrick, Caitlyn S, PA-C  Fluticasone-Salmeterol (ADVAIR) 100-50 MCG/DOSE AEPB INHALE ONE (1) PUFF BY MOUTH TWICE DAILY 01/02/20   Hurshel Party C, MD  isosorbide mononitrate (IMDUR) 30 MG 24 hr tablet Take 3 tablets (90 mg total) by mouth daily. 12/31/19   Satira Sark, MD  Light Mineral Oil-Mineral Oil (RETAINE MGD OP) Apply 1 application to eye daily.     [provider]  LINZESS 145 MCG CAPS capsule Take 1 capsule (145 mcg total) by mouth daily. 11/19/19   [name], MD  losartan (COZAAR) 100 MG tablet Take 1 tablet (100 mg total) by mouth daily. 12/31/19   Satira Sark, MD  Medical ID Bracelet MISC 1 Units by Does not apply route daily. 03/06/19   [name], MD  metoprolol tartrate (LOPRESSOR) 50 MG tablet Take 1.5 tablets (75 mg total) by mouth 2 (two) times daily. 12/31/19   Satira Sark, MD   nitroGLYCERIN (NITROSTAT) 0.4 MG SL tablet Place 1 tablet (0.4 mg total) under the tongue every 5 (five) minutes as needed for chest pain. 12/31/19   Satira Sark, MD  NONFORMULARY OR COMPOUNDED ITEM Inject 5 mg into the muscle every 7 (seven) days. Testosterone cypionate in olive  oil (25 mg/mL).  Dispense 5 mL vial. 06/11/19   Gosrani, Nimish C, MD  ondansetron (ZOFRAN ODT) 8 MG disintegrating tablet Take 1 tablet (8 mg total) by mouth every 8 (eight) hours as needed for nausea or vomiting. 12/10/19   Minus Liberty, PA-C  pantoprazole (PROTONIX) 40 MG tablet Take 1 tablet (40 mg total) by mouth 2 (two) times daily before a meal. 11/19/19   Gosrani, Nimish C, MD  potassium chloride 20 MEQ/15ML (10%) SOLN Take 15 mLs (20 mEq total) by mouth daily. 12/31/19   Satira Sark, MD  prednisoLONE acetate (PRED FORTE) 1 % ophthalmic suspension Place 1 drop into both eyes at bedtime.  10/23/19   [provider]  predniSONE (DELTASONE) 20 MG tablet Take 2 tablets (40 mg total) by mouth daily with breakfast. 01/14/20   [name], MD  Testosterone Propionate (FIRST-TESTOSTERONE MC) 2 % CREA Place 5 mg onto the skin daily. 09/04/19   [name], MD  thyroid (NP THYROID) 120 MG tablet Take 1 tablet (120 mg total) by mouth daily before breakfast. 11/19/19   [name], MD    Allergies    Bee venom, Penicillins, and Shellfish allergy  Review of Systems   Review of Systems  All other systems reviewed and are negative. Ten systems reviewed and are negative for acute change, except as noted in the HPI.   Physical Exam Updated Vital Signs BP (!) 161/92 (BP Location: Right Arm)   Pulse 61   Temp 98.5 F (36.9 C) (Oral)   Resp 18   Ht $R'5\' 4"'tx$  (1.626 m)   Wt 68.9 kg   SpO2 99%   BMI 26.09 kg/m   Physical Exam Vitals and nursing note reviewed.  Constitutional:      General: She is not in acute distress.    Appearance: Normal appearance. She is well-developed and normal  weight. She is not ill-appearing, toxic-appearing or diaphoretic.  HENT:     Head: Normocephalic and atraumatic.     Right Ear: External ear normal.     Left Ear: External ear normal.     Nose: Nose normal.     Mouth/Throat:     Mouth: Mucous membranes are moist.     Pharynx: Oropharynx is clear. No oropharyngeal exudate or posterior oropharyngeal erythema.  Eyes:     Extraocular Movements: Extraocular movements intact.  Cardiovascular:     Rate and Rhythm: Normal rate and regular rhythm.     Pulses: Normal pulses.     Heart sounds: Normal heart sounds. No murmur heard.  No friction rub. No gallop.   Pulmonary:     Effort: Pulmonary effort is normal. No respiratory distress.     Breath sounds: Normal breath sounds. No stridor. No wheezing, rhonchi or rales.  Abdominal:     General: Abdomen is flat.     Palpations: Abdomen is soft.     Tenderness: There  is abdominal tenderness in the right lower quadrant. Negative signs include Murphy's sign.  Musculoskeletal:        General: Normal range of motion.     Cervical back: Normal range of motion and neck supple. No tenderness.  Skin:    General: Skin is warm and dry.  Neurological:     General: No focal deficit present.     Mental Status: She is alert and oriented to person, place, and time.  Psychiatric:        Mood and Affect: Mood normal.        Behavior: Behavior normal.    ED Results / Procedures / Treatments   Labs (all labs ordered are listed, but only abnormal results are displayed) Labs Reviewed  COMPREHENSIVE METABOLIC PANEL - Abnormal; Notable for the following components:      Result Value   Potassium 3.4 (*)    Glucose, Bld 131 (*)    ALT 49 (*)    Alkaline Phosphatase 132 (*)    All other components within normal limits  LIPASE, BLOOD  CBC  URINALYSIS, ROUTINE W REFLEX MICROSCOPIC   EKG None  Radiology US Abdomen Limited RUQ  Result Date: 01/23/2020 CLINICAL DATA:  Right upper quadrant pain EXAM:  ULTRASOUND ABDOMEN LIMITED RIGHT UPPER QUADRANT COMPARISON:  12/05/2019 FINDINGS: Gallbladder: No gallstones or wall thickening visualized. No sonographic Murphy sign noted by sonographer. Common bile duct: Diameter: 3.5 mm. Liver: Multiple hyperechoic lesions are again identified within the liver. The largest of these lies in the left lobe measuring 3.1 cm. These are again consistent with hemangiomas. No other focal abnormality is noted. Portal vein is patent on color Doppler imaging with normal direction of blood flow towards the liver. Other: None. IMPRESSION: Multiple hemangiomas within the liver. No other focal abnormality is noted. Electronically Signed   By: Inez Catalina M.D.   On: 01/23/2020 16:49   Procedures Procedures   Medications Ordered in ED Medications  HYDROmorphone (DILAUDID) injection 0.5 mg (has no administration in time range)  iohexol (OMNIPAQUE) 300 MG/ML solution 100 mL (has no administration in time range)   ED Course  I have reviewed the triage vital signs and the nursing notes.  Pertinent labs & imaging results that were available during my care of the patient were reviewed by me and considered in my medical decision making (see chart for details).  Clinical Course as of Jan 22 2002  Wed Jan 23, 2020  1757 Alkaline Phosphatase(!): 132 [LJ]  1757 ALT(!): 49 [LJ]  1817 Pt discussed with Dr. Laural Golden. Recommended CT scan of abdomen/pelvis and pain control. If negative, f/u with his office first thing tomorrow for likely HIDA scan.    [LJ]    Clinical Course User Index [LJ] Rayna Sexton, PA-C   MDM Rules/Calculators/A&P                          Pt is a 54 y.o. female that presents with a history, physical exam, and ED Clinical Course as noted above.   Patient presents today with acute on chronic right-sided abdominal pain.  On my exam patient is having exquisite right lower quadrant tenderness.  She was sent over by GI for right upper quadrant ultrasound.   This was obtained and showed no acute findings.  Mild elevation in ALT at 49 as well as a mild elevation in alk phos at 132.  Hypokalemia of 3.4.  Afebrile.  Not tachycardic.  Patient was discussed  with her gastroenterologist Dr. Laural Golden.  He recommended that we obtain a CT scan of the abdomen and if negative discharge patient with a short course of pain medication and have her follow-up with him outpatient tomorrow.  This was discussed with the patient and she was amenable.    I was notified by the nursing staff that while waiting for the CT scan the patient chose to elope due to the wait time.  Nursing staff attempted to get her to stay but apparently the patient refused.  Note: Portions of this report may have been transcribed using voice recognition software. Every effort was made to ensure accuracy; however, inadvertent computerized transcription errors may be present.   Final Clinical Impression(s) / ED Diagnoses Final diagnoses:  RUQ abdominal pain  Right lower quadrant abdominal pain   Rx / DC Orders ED Discharge Orders    None       Rayna Sexton, PA-C 01/23/20 2004    Hayden Rasmussen, MD 01/24/20 1052

## 2020-01-23 NOTE — ED Notes (Signed)
Pt's states she is unable to get a urine sample at this time. Would like something to drink.

## 2020-01-23 NOTE — Telephone Encounter (Signed)
Talked with patient. She states that this has been going on for the last 4 days. She states that the pain is Right lower Quadrant- got to pelvic bone and move hand to her stomach. When the patient hits her it brings her to her knees. She states that it is worse when she eats , and feels that she will have to stop eating.She is drinking lots of water. She complains of nausea , has had no fever. She is having normal Bms, she has has 3 today and did not see any blood. The stool was normal.  Patient states that she is becoming perplexed by this.  Patient was advised that I would call her back as I heard from [place]. he has been paged.

## 2020-01-23 NOTE — ED Triage Notes (Signed)
Pt to er, pt states that her doctor sent her over to be evaluated for her gallbladder, pt c/o abd pain that is worse after she eats

## 2020-01-23 NOTE — Telephone Encounter (Signed)
Patient called stated she is having a lot of abdominal pain - she doesn't know what is going on - she has been taking the Linzess - states Dr Laural Golden knows about her gall bladder issues  -  Please advise - ph# [phone]

## 2020-01-23 NOTE — ED Notes (Signed)
Pt still not visualized

## 2020-01-23 NOTE — ED Notes (Signed)
pt not visualized in bed

## 2020-01-23 NOTE — Telephone Encounter (Signed)
Ms. Thomas Hoff, LPN is talk with patient in she will come to emergency room for further evaluation.

## 2020-01-28 ENCOUNTER — Other Ambulatory Visit (INDEPENDENT_AMBULATORY_CARE_PROVIDER_SITE_OTHER): Payer: Self-pay | Admitting: Internal Medicine

## 2020-01-28 ENCOUNTER — Telehealth (INDEPENDENT_AMBULATORY_CARE_PROVIDER_SITE_OTHER): Payer: Self-pay

## 2020-01-28 MED ORDER — ALPRAZOLAM 0.5 MG PO TABS
0.5000 mg | ORAL_TABLET | Freq: Two times a day (BID) | ORAL | 3 refills | Status: DC | PRN
Start: 2020-01-28 — End: 2020-03-04

## 2020-01-28 NOTE — Telephone Encounter (Signed)
Pt came into front office and has some pains & concerns for the due visit at [facility] ER. Pt is stating she went to see Dr Shelva Majestic and he sent her to ER to get some

## 2020-01-28 NOTE — Telephone Encounter (Signed)
Sent her a Advertising account executive.

## 2020-01-28 NOTE — Telephone Encounter (Signed)
I have sent a new prescription for her anxiety medicine now to [facility].  I think we can wait until her next appointment in about a month's time because she probably will calm down.  If she does not, then I can certainly see her.  If she insists that she wants to see me then make a appointment for at least 45 minutes.  Thanks.

## 2020-01-28 NOTE — Telephone Encounter (Signed)
Pt went to ER per Dr Laural Golden. Pt is very upset reason being she stayed in ER for 5 hrs and not told what was  Wrong; she is very "high strung" today and needs to be able to find out what is to be done. ER provider told her she may be having  Appendix to begin rupturing soon;tba. They was to give her pain med's; she got so upset when they put her in hall she left [facility] " AMA". So now today she s in office wanting something done ASAP again.   looking over medications, she is out of her anxiety med's and not clear on Behavioral health medications currently when asked. She did not know. Please advise what to do next. She is saying she has a great deal going on this week ; having to move again this year. 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 Taking care of grandson sick with Asthma attacks last few days.

## 2020-01-29 ENCOUNTER — Ambulatory Visit (INDEPENDENT_AMBULATORY_CARE_PROVIDER_SITE_OTHER): Payer: Medicare Other

## 2020-02-05 ENCOUNTER — Ambulatory Visit
Admission: RE | Admit: 2020-02-05 | Discharge: 2020-02-05 | Disposition: A | Discharge status: Home / Self Care | Payer: Medicare Other | Source: Ambulatory Visit | Attending: Family Medicine | Admitting: Family Medicine

## 2020-02-05 ENCOUNTER — Telehealth: Payer: Self-pay | Admitting: Family Medicine

## 2020-02-05 ENCOUNTER — Other Ambulatory Visit: Payer: Self-pay

## 2020-02-05 ENCOUNTER — Ambulatory Visit (INDEPENDENT_AMBULATORY_CARE_PROVIDER_SITE_OTHER): Payer: Medicare Other

## 2020-02-05 DIAGNOSIS — M25521 Pain in right elbow: Secondary | ICD-10-CM

## 2020-02-05 DIAGNOSIS — M19021 Primary osteoarthritis, right elbow: Secondary | ICD-10-CM | POA: Diagnosis not present

## 2020-02-05 DIAGNOSIS — M24821 Other specific joint derangements of right elbow, not elsewhere classified: Secondary | ICD-10-CM

## 2020-02-05 DIAGNOSIS — R531 Weakness: Secondary | ICD-10-CM | POA: Diagnosis not present

## 2020-02-05 NOTE — Telephone Encounter (Signed)
MRI shows some arthritis in the elbow, but no sign of loose bodies.  No indication for surgery at this point.  If still in a lot of pain, I will request referral to hand therapy.

## 2020-02-06 NOTE — Telephone Encounter (Signed)
I called the patient and advised her of the results. She is still having a lot of pain. The right hand is "unusable." She has dropped/broken 4 coffee cups. Right-hand dominant. Tylenol is not helping the pain. Cannot take anything with aspirin in it. She is willing to try therapy.

## 2020-02-06 NOTE — Telephone Encounter (Signed)
Would you please fax the order and demographics to [facility] in [place]?

## 2020-02-06 NOTE — Addendum Note (Signed)
Addended by: Hortencia Pilar on: 02/06/2020 11:44 AM   Modules accepted: Orders

## 2020-02-06 NOTE — Telephone Encounter (Signed)
Demographics and order has been faxed to [facility] at [phone]

## 2020-02-06 NOTE — Telephone Encounter (Signed)
Orders placed for [facility] in [place].

## 2020-02-11 ENCOUNTER — Other Ambulatory Visit (HOSPITAL_COMMUNITY): Payer: Self-pay | Admitting: Internal Medicine

## 2020-02-11 DIAGNOSIS — Z1231 Encounter for screening mammogram for malignant neoplasm of breast: Secondary | ICD-10-CM

## 2020-02-12 ENCOUNTER — Ambulatory Visit (INDEPENDENT_AMBULATORY_CARE_PROVIDER_SITE_OTHER): Payer: Medicare Other

## 2020-02-18 ENCOUNTER — Telehealth: Payer: Self-pay | Admitting: Licensed Clinical Social Worker

## 2020-02-18 ENCOUNTER — Telehealth (INDEPENDENT_AMBULATORY_CARE_PROVIDER_SITE_OTHER): Payer: Self-pay

## 2020-02-18 ENCOUNTER — Telehealth (INDEPENDENT_AMBULATORY_CARE_PROVIDER_SITE_OTHER): Payer: Self-pay | Admitting: Internal Medicine

## 2020-02-18 NOTE — Telephone Encounter (Signed)
Attempted to reach, unable to leave message. Note indicates her brother is picking her up today and she is with a CNA in a safe place. I will inform our social worker, Ulla Gallo, and attempt again to reach patient.

## 2020-02-18 NOTE — Telephone Encounter (Signed)
HIPAA compliant message left in regards to referral for check in/resources from [facility], message left on [phone]. Left information for pt to return call.   Westley Hummer, MSW, [place] [employer]

## 2020-02-18 NOTE — Telephone Encounter (Signed)
Please see documentation. We can certainly adjust her follow-up visits as needed.  Please reach out to her to see if she needs any other assistance (such as referral to a safe place or notification of authorities).

## 2020-02-18 NOTE — Telephone Encounter (Signed)
When the patient came in the office this morning, did you give her these recommendations per Dr. [name]?
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
## 2020-02-18 NOTE — Telephone Encounter (Signed)
Patient returning call.

## 2020-02-18 NOTE — Telephone Encounter (Signed)
I would recommend Tylenol over-the-counter, allergy medicines that she can tolerate.  If symptoms get worse, she can call us back.

## 2020-02-19 ENCOUNTER — Telehealth: Payer: Self-pay | Admitting: Licensed Clinical Social Worker

## 2020-02-19 ENCOUNTER — Ambulatory Visit (INDEPENDENT_AMBULATORY_CARE_PROVIDER_SITE_OTHER): Payer: Medicare Other

## 2020-02-19 NOTE — Telephone Encounter (Signed)
Additional HIPAA compliant message left for pt at [phone].  Westley Hummer, MSW, [place] [employer]

## 2020-02-20 ENCOUNTER — Telehealth: Payer: Self-pay | Admitting: Licensed Clinical Social Worker
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
## 2020-02-26 ENCOUNTER — Ambulatory Visit (INDEPENDENT_AMBULATORY_CARE_PROVIDER_SITE_OTHER): Payer: Medicare Other

## 2020-03-04 ENCOUNTER — Ambulatory Visit (INDEPENDENT_AMBULATORY_CARE_PROVIDER_SITE_OTHER): Payer: Medicare Other | Admitting: Internal Medicine

## 2020-03-04 ENCOUNTER — Other Ambulatory Visit: Payer: Self-pay

## 2020-03-04 ENCOUNTER — Encounter (INDEPENDENT_AMBULATORY_CARE_PROVIDER_SITE_OTHER): Payer: Self-pay | Admitting: Internal Medicine

## 2020-03-04 ENCOUNTER — Other Ambulatory Visit (INDEPENDENT_AMBULATORY_CARE_PROVIDER_SITE_OTHER): Payer: Self-pay | Admitting: Internal Medicine

## 2020-03-04 ENCOUNTER — Ambulatory Visit (INDEPENDENT_AMBULATORY_CARE_PROVIDER_SITE_OTHER): Payer: Medicare Other

## 2020-03-04 VITALS — BP 164/94 | HR 75 | Temp 97.3°F | Ht 64.0 in | Wt 164.8 lb

## 2020-03-04 DIAGNOSIS — R21 Rash and other nonspecific skin eruption: Secondary | ICD-10-CM | POA: Diagnosis not present

## 2020-03-04 DIAGNOSIS — R519 Headache, unspecified: Secondary | ICD-10-CM | POA: Diagnosis not present

## 2020-03-04 MED ORDER — TRIAMCINOLONE ACETONIDE 0.1 % EX CREA: 1.0000 "application " | TOPICAL_CREAM | Freq: Two times a day (BID) | CUTANEOUS | 0 refills | Status: DC

## 2020-03-04 MED ORDER — ALPRAZOLAM 0.5 MG PO TABS
0.5000 mg | ORAL_TABLET | Freq: Two times a day (BID) | ORAL | 3 refills | Status: DC | PRN
Start: 2020-03-04 — End: 2020-03-19

## 2020-03-04 NOTE — Progress Notes (Signed)
Metrics: Intervention Frequency ACO  Documented Smoking Status Yearly  Screened one or more times in 24 months  Cessation Counseling or  Active cessation medication Past 24 months  Past 24 months   Guideline developer: UpToDate (See UpToDate for funding source) Date Released: 2014       Wellness Office Visit  Subjective:  Patient ID: Bethany Wang, female    DOB: 03-31-1966  Age: 54 y.o. MRN: [id number]  CC: This lady comes in acute visit for an itchy rash. HPI  She has had this rash for at least the last week and it is on her face, hands, shoulder areas.  She does not know why she has this.  I believe we had dealt with this previously and I given her a short course of prednisone which did not really help her significantly.  I also had offered referral to an allergist but this was going to take a very long time. She is also complaining of headaches which are typically associated with photophobia and tearing of her eyes.  They sound like cluster headaches.  These have been going on for the last 1 month.  She has a neurologist that she sees. Past Medical History:  Diagnosis Date  . Anemia   . Anxiety   . Bilateral breast cysts 12/30/2015  . Brain tumor ([place])   . Breast cancer ([place])    Left Breast Cancer  . Breast disorder   . BV (bacterial vaginosis) 09/09/2015  . Coronary atherosclerosis of native coronary artery    a. s/p DES to RCA in 2013 b. DES to LAD in 2014 c. cath in 04/2016 showing patent LAD stent with D2 jailed by LAD stent and CTO of RCA with left to right collaterals present  . Cyst of pharynx or nasopharynx    [name]  . Decreased libido 12/25/2018  . Depression   . Essential hypertension   . Falls    x 3-4 in past year  . GERD (gastroesophageal reflux disease)   . Headache   . History of hematuria   . Mixed hyperlipidemia   . Personal history of chemotherapy    Left Breast Cancer  . Personal history of radiation therapy    Left Breast  Cancer  . Pre-diabetes   . PUD (peptic ulcer disease)   . Seizures ([place])   . Shingles 09/09/2015  . Stroke ([place])    12-2017 on Plavix, only deficit is headaches  . Suicide attempt ([place])    3 attempts in remote past  . Trichimoniasis 08/20/2019   Treated 08/20/19   . Uterine cancer ([place])   . Vitamin D deficiency disease 04/03/2019   Past Surgical History:  Procedure Laterality Date  . ABDOMINAL HYSTERECTOMY    . BIOPSY  11/28/2019   Procedure: BIOPSY;  Surgeon: Rogene Houston, MD;  [facility];  Service: Endoscopy;;  antral, gastric body   . BLADDER SURGERY    . BREAST LUMPECTOMY Left   . BREAST LUMPECTOMY Right 02/24/2018   Procedure: RIGHT BREAST LUMPECTOMY ERAS PATHWAY;  Surgeon: Jovita Kussmaul, MD;  Location: [place];  Service: General;  Laterality: Right;  . CARDIAC CATHETERIZATION N/A 05/10/2016   Procedure: Left Heart Cath and Coronary Angiography;  Surgeon: Belva Crome, MD;  Location: [place] [facility];  Service: Cardiovascular;  Laterality: N/A;  . COLONOSCOPY  May 2010   Fleishman: normal rectum, internal hemorrhoids, , benign colonic polyp  . COLONOSCOPY N/A 01/05/2017   Procedure: COLONOSCOPY;  Surgeon: Rogene Houston, MD;  [facility];  Service: Endoscopy;  Laterality: N/A;  1:00  . ESOPHAGEAL DILATION  11/28/2019   Procedure: ESOPHAGEAL DILATION;  Surgeon: Rogene Houston, MD;  [facility];  Service: Endoscopy;;  . ESOPHAGOGASTRODUODENOSCOPY     2001 Dr. Amedeo Plenty: distal esophagitis, small hiatal hernia,. Dr. Tamala Julian 2006? no records available currently, pt also reports  EGD a few years ago with Dr. Gala Romney, do not have these reports anywhere in medical records  . ESOPHAGOGASTRODUODENOSCOPY  06/02/2011   JOA:CZYSAY pill impaction as described above s/p dilation of a probable cervical esophageal web/bx abnormal esophageal and gastric mucosa. + H.pylori gastritis   . ESOPHAGOGASTRODUODENOSCOPY (EGD) WITH PROPOFOL N/A 11/28/2019   Procedure:  ESOPHAGOGASTRODUODENOSCOPY (EGD) WITH PROPOFOL;  Surgeon: Rogene Houston, MD;  [facility];  Service: Endoscopy;  Laterality: N/A;  1020  . EYE SURGERY     Removed glass  . HAND SURGERY Right   . Left breast lumpectomy     Benign  . LEFT HEART CATH AND CORONARY ANGIOGRAPHY N/A 10/25/2018   Procedure: LEFT HEART CATH AND CORONARY ANGIOGRAPHY;  Surgeon: Wellington Hampshire, MD;  Location: [place] [facility];  Service: Cardiovascular;  Laterality: N/A;  . LEFT HEART CATHETERIZATION WITH CORONARY ANGIOGRAM N/A 07/03/2014   Procedure: LEFT HEART CATHETERIZATION WITH CORONARY ANGIOGRAM;  Surgeon: Belva Crome, MD;  Location: [facility];  Service: Cardiovascular;  Laterality: N/A;  . POLYPECTOMY  01/05/2017   Procedure: POLYPECTOMY;  Surgeon: Rogene Houston, MD;  [facility];  Service: Endoscopy;;  colon  . SHOULDER SURGERY Left      Family History  Problem Relation Age of Onset  . Colon cancer Paternal Grandfather   . Cancer Father   . Cancer Maternal Uncle   . Alzheimer's disease Paternal Aunt   . Cirrhosis Maternal Uncle   . Cancer Paternal Aunt        lung  . Aneurysm Mother        brain  . Heart disease Brother   . Heart attack Brother   . Mental illness Daughter   . ADD / ADHD Daughter   . Bipolar disorder Daughter   . Mental illness Son   . ADD / ADHD Son   . Bipolar disorder Son   . Mental illness Son   . ADD / ADHD Son   . Bipolar disorder Son     Social History   Social History Narrative   Patient lives at home with her husband but in the process of being separated now.On disability since age 36 yrs.   Social History   Tobacco Use  . Smoking status: Former Smoker    Packs/day: 0.20    Years: 32.00    Pack years: 6.40    Types: Cigarettes, Cigars    Start date: 07/05/1983    Quit date: 04/03/2012    Years since quitting: 7.9  . Smokeless tobacco: Never Used  Substance Use Topics  . Alcohol use: No    Alcohol/week: 0.0 standard  drinks    Current Meds  Medication Sig  . ALPRAZolam (XANAX) 0.5 MG tablet Take 1 tablet (0.5 mg total) by mouth 2 (two) times daily as needed.  [name] amLODipine (NORVASC) 10 MG tablet Take 1 tablet (10 mg total) by mouth daily.  [name] aspirin EC 81 MG tablet Take 2 tablets (162 mg total) by mouth 2 (two) times daily.  [name] atorvastatin (LIPITOR) 40 MG tablet Take  1 tablet (40 mg total) by mouth daily at 6 PM.  . bismuth-metronidazole-tetracycline (PYLERA) 140-125-125 MG capsule Take 3 capsules by mouth 4 (four) times daily -  before meals and at bedtime.  . Cholecalciferol (VITAMIN D3) 25 MCG (1000 UT) CHEW Chew 2,000 Units by mouth daily.  . ciprofloxacin-dexamethasone (CIPRODEX) OTIC suspension   . clopidogrel (PLAVIX) 75 MG tablet Take 1 tablet (75 mg total) by mouth daily.  [name] escitalopram (LEXAPRO) 20 MG tablet Take 1 tablet (20 mg total) by mouth daily.  . fluticasone (FLONASE) 50 MCG/ACT nasal spray Place 1 spray into both nostrils daily.  . Fluticasone-Salmeterol (ADVAIR) 100-50 MCG/DOSE AEPB INHALE ONE (1) PUFF BY MOUTH TWICE DAILY  . isosorbide mononitrate (IMDUR) 30 MG 24 hr tablet Take 3 tablets (90 mg total) by mouth daily.  Sunday Corn Mineral Oil-Mineral Oil (RETAINE MGD OP) Apply 1 application to eye daily.   [name] LINZESS 145 MCG CAPS capsule Take 1 capsule (145 mcg total) by mouth daily.  [name] losartan (COZAAR) 100 MG tablet Take 1 tablet (100 mg total) by mouth daily.  . Medical ID Bracelet MISC 1 Units by Does not apply route daily.  . metoprolol tartrate (LOPRESSOR) 50 MG tablet Take 1.5 tablets (75 mg total) by mouth 2 (two) times daily.  . nitroGLYCERIN (NITROSTAT) 0.4 MG SL tablet Place 1 tablet (0.4 mg total) under the tongue every 5 (five) minutes as needed for chest pain.  . NONFORMULARY OR COMPOUNDED ITEM Inject 5 mg into the muscle every 7 (seven) days. Testosterone cypionate in olive  oil (25 mg/mL).  Dispense 5 mL vial.  . ondansetron (ZOFRAN ODT) 8 MG disintegrating tablet Take 1  tablet (8 mg total) by mouth every 8 (eight) hours as needed for nausea or vomiting.  . pantoprazole (PROTONIX) 40 MG tablet Take 1 tablet (40 mg total) by mouth 2 (two) times daily before a meal.  . potassium chloride 20 MEQ/15ML (10%) SOLN Take 15 mLs (20 mEq total) by mouth daily.  . prednisoLONE acetate (PRED FORTE) 1 % ophthalmic suspension Place 1 drop into both eyes at bedtime.   . Testosterone Propionate (FIRST-TESTOSTERONE MC) 2 % CREA Place 5 mg onto the skin daily.  [name] thyroid (NP THYROID) 120 MG tablet Take 1 tablet (120 mg total) by mouth daily before breakfast.   Current Facility-Administered Medications for the 03/04/20 encounter (Office Visit) with [name], MD  Medication  . NONFORMULARY OR COMPOUNDED ITEM 0.2 mL      Depression screen [facility] 2/9 08/15/2019 07/02/2019 03/14/2017  Decreased Interest 3 0 3  Down, Depressed, Hopeless 1 0 0  PHQ - 2 Score 4 0 3  Altered sleeping 1 - 3  Tired, decreased energy 3 - 3  Change in appetite 3 - 3  Feeling bad or failure about yourself  3 - 0  Trouble concentrating 1 - 0  Moving slowly or fidgety/restless 0 - 0  Suicidal thoughts 0 - 0  PHQ-9 Score 15 - 12  Difficult doing work/chores Very difficult - Very difficult  Some recent data might be hidden     Objective:   Today's Vitals: BP (!) 164/94   Pulse 75   Temp (!) 97.3 F (36.3 C) (Temporal)   Ht 5\' 4"  (1.626 m)   Wt 164 lb 12.8 oz (74.8 kg)   SpO2 99%   BMI 28.29 kg/m  Vitals with BMI 03/04/2020 01/23/2020 01/23/2020  Height 5\' 4"  - 5\' 4"   Weight 164 lbs 13 oz - 152  lbs  BMI 94.58 - 59.29  Systolic 244 628 638  Diastolic 94 92 177  Pulse 75 61 79     Physical Exam       Assessment   1. Rash and nonspecific skin eruption   2. Acute nonintractable headache, unspecified headache type       Tests ordered Orders Placed This Encounter  Procedures  . Ambulatory referral to Dermatology     Plan: 1. I am going to try empirically on  triamcinolone cream for this rash to see if it will help her and we will refer also to dermatology. 2. As far as the headaches are concerned, I have urged her to call her neurologist for an evaluation. 3. Follow-up in 3 months.   Meds ordered this encounter  Medications  . triamcinolone (KENALOG) 0.1 %    Sig: Apply 1 application topically 2 (two) times daily.    Dispense:  30 g    Refill:  0    Bear Osten Luther Parody, MD

## 2020-03-11 ENCOUNTER — Ambulatory Visit (INDEPENDENT_AMBULATORY_CARE_PROVIDER_SITE_OTHER): Payer: Medicare Other

## 2020-03-17 ENCOUNTER — Other Ambulatory Visit: Payer: Self-pay

## 2020-03-17 ENCOUNTER — Ambulatory Visit (HOSPITAL_COMMUNITY)
Admission: RE | Admit: 2020-03-17 | Discharge: 2020-03-17 | Disposition: A | Discharge status: Home / Self Care | Payer: Medicare Other | Source: Ambulatory Visit | Attending: Internal Medicine | Admitting: Internal Medicine

## 2020-03-17 DIAGNOSIS — Z1231 Encounter for screening mammogram for malignant neoplasm of breast: Secondary | ICD-10-CM | POA: Insufficient documentation

## 2020-03-18 ENCOUNTER — Ambulatory Visit (INDEPENDENT_AMBULATORY_CARE_PROVIDER_SITE_OTHER): Payer: Medicare Other

## 2020-03-19 ENCOUNTER — Telehealth (INDEPENDENT_AMBULATORY_CARE_PROVIDER_SITE_OTHER): Payer: Self-pay

## 2020-03-19 ENCOUNTER — Encounter (INDEPENDENT_AMBULATORY_CARE_PROVIDER_SITE_OTHER): Payer: Self-pay | Admitting: Internal Medicine

## 2020-03-19 ENCOUNTER — Other Ambulatory Visit (HOSPITAL_COMMUNITY): Payer: Self-pay | Admitting: Internal Medicine

## 2020-03-19 ENCOUNTER — Telehealth (INDEPENDENT_AMBULATORY_CARE_PROVIDER_SITE_OTHER): Payer: Medicare Other | Admitting: Internal Medicine

## 2020-03-19 DIAGNOSIS — J209 Acute bronchitis, unspecified: Secondary | ICD-10-CM | POA: Diagnosis not present

## 2020-03-19 DIAGNOSIS — R059 Cough, unspecified: Secondary | ICD-10-CM | POA: Diagnosis not present

## 2020-03-19 DIAGNOSIS — R928 Other abnormal and inconclusive findings on diagnostic imaging of breast: Secondary | ICD-10-CM

## 2020-03-19 MED ORDER — AZITHROMYCIN 250 MG PO TABS: ORAL_TABLET | ORAL | 0 refills | Status: DC

## 2020-03-19 MED ORDER — PREDNISONE 20 MG PO TABS: 40.0000 mg | ORAL_TABLET | Freq: Every day | ORAL | 1 refills | Status: DC

## 2020-03-19 MED ORDER — ALPRAZOLAM 0.5 MG PO TABS
0.5000 mg | ORAL_TABLET | Freq: Two times a day (BID) | ORAL | 3 refills | Status: DC | PRN
Start: 2020-03-19 — End: 2020-08-11

## 2020-03-19 NOTE — Telephone Encounter (Signed)
Pt came in office up front fussing that she called 3 times and could not get a answer & left a voicemail. Stated she was having the head congestions and coughing real bad again.Pt wants the Dr [name] to re send the medication she taken last time for the cough was syrup & steroid.  Pt was offered  To do a telemedicine visit over the phone /virtural.Pt stated to the front desk  But she was going to the Urgent care or telemedicine . Walked  Out to the curb with patient as she was loud and upset in front of lobby patients. She was told the reason she was offered a telemedicine visit. Also, that we had no way to call her back due to phone number in chart was disconnected. I had no way to call her back.  Pt gave the person her number, [phone]. She is willing to take a call on this number of a friend driving her around.

## 2020-03-19 NOTE — Progress Notes (Signed)
Metrics: Intervention Frequency ACO  Documented Smoking Status Yearly  Screened one or more times in 24 months  Cessation Counseling or  Active cessation medication Past 24 months  Past 24 months   Guideline developer: UpToDate (See UpToDate for funding source) Date Released: 2014       Wellness Office Visit  Subjective:  Patient ID: Bethany Wang, female    DOB: 1965-07-07  Age: 54 y.o. MRN: [id number]  CC: This is an audio telemedicine visit with the permission of the patient who is at home and I am in my office. Cough productive of yellow-green sputum. HPI  The patient describes the above symptoms for the last several days.  She denies any fever, there is some wheezing but no significant dyspnea.  She denies any body aches or headache.  She has had congestion also.  She is fully vaccinated with COVID-19 vaccine as well as a booster that she received last week.  She has not been in contact that she knows of who has had COVID-19 disease. Past Medical History:  Diagnosis Date  . Anemia   . Anxiety   . Bilateral breast cysts 12/30/2015  . Brain tumor ([place])   . Breast cancer ([place])    Left Breast Cancer  . Breast disorder   . BV (bacterial vaginosis) 09/09/2015  . Coronary atherosclerosis of native coronary artery    a. s/p DES to RCA in 2013 b. DES to LAD in 2014 c. cath in 04/2016 showing patent LAD stent with D2 jailed by LAD stent and CTO of RCA with left to right collaterals present  . Cyst of pharynx or nasopharynx    [name]  . Decreased libido 12/25/2018  . Depression   . Essential hypertension   . Falls    x 3-4 in past year  . GERD (gastroesophageal reflux disease)   . Headache   . History of hematuria   . Mixed hyperlipidemia   . Personal history of chemotherapy    Left Breast Cancer  . Personal history of radiation therapy    Left Breast Cancer  . Pre-diabetes   . PUD (peptic ulcer disease)   . Seizures ([place])   . Shingles 09/09/2015  .  Stroke ([place])    12-2017 on Plavix, only deficit is headaches  . Suicide attempt ([place])    3 attempts in remote past  . Trichimoniasis 08/20/2019   Treated 08/20/19   . Uterine cancer ([place])   . Vitamin D deficiency disease 04/03/2019   Past Surgical History:  Procedure Laterality Date  . ABDOMINAL HYSTERECTOMY    . BIOPSY  11/28/2019   Procedure: BIOPSY;  Surgeon: Rogene Houston, MD;  [facility];  Service: Endoscopy;;  antral, gastric body   . BLADDER SURGERY    . BREAST LUMPECTOMY Left   . BREAST LUMPECTOMY Right 02/24/2018   Procedure: RIGHT BREAST LUMPECTOMY ERAS PATHWAY;  Surgeon: Jovita Kussmaul, MD;  Location: [place];  Service: General;  Laterality: Right;  . CARDIAC CATHETERIZATION N/A 05/10/2016   Procedure: Left Heart Cath and Coronary Angiography;  Surgeon: Belva Crome, MD;  Location: [place] [facility];  Service: Cardiovascular;  Laterality: N/A;  . COLONOSCOPY  May 2010   Fleishman: normal rectum, internal hemorrhoids, , benign colonic polyp  . COLONOSCOPY N/A 01/05/2017   Procedure: COLONOSCOPY;  Surgeon: Rogene Houston, MD;  [facility];  Service: Endoscopy;  Laterality: N/A;  1:00  . ESOPHAGEAL DILATION  11/28/2019  Procedure: ESOPHAGEAL DILATION;  Surgeon: Rogene Houston, MD;  [facility];  Service: Endoscopy;;  . ESOPHAGOGASTRODUODENOSCOPY     2001 Dr. Amedeo Plenty: distal esophagitis, small hiatal hernia,. Dr. Tamala Julian 2006? no records available currently, pt also reports  EGD a few years ago with Dr. Gala Romney, do not have these reports anywhere in medical records  . ESOPHAGOGASTRODUODENOSCOPY  06/02/2011   [id number] pill impaction as described above s/p dilation of a probable cervical esophageal web/bx abnormal esophageal and gastric mucosa. + H.pylori gastritis   . ESOPHAGOGASTRODUODENOSCOPY (EGD) WITH PROPOFOL N/A 11/28/2019   Procedure: ESOPHAGOGASTRODUODENOSCOPY (EGD) WITH PROPOFOL;  Surgeon: Rogene Houston, MD;  [facility];   Service: Endoscopy;  Laterality: N/A;  1020  . EYE SURGERY     Removed glass  . HAND SURGERY Right   . Left breast lumpectomy     Benign  . LEFT HEART CATH AND CORONARY ANGIOGRAPHY N/A 10/25/2018   Procedure: LEFT HEART CATH AND CORONARY ANGIOGRAPHY;  Surgeon: Wellington Hampshire, MD;  Location: [place] [facility];  Service: Cardiovascular;  Laterality: N/A;  . LEFT HEART CATHETERIZATION WITH CORONARY ANGIOGRAM N/A 07/03/2014   Procedure: LEFT HEART CATHETERIZATION WITH CORONARY ANGIOGRAM;  Surgeon: Belva Crome, MD;  Location: [facility];  Service: Cardiovascular;  Laterality: N/A;  . POLYPECTOMY  01/05/2017   Procedure: POLYPECTOMY;  Surgeon: Rogene Houston, MD;  [facility];  Service: Endoscopy;;  colon  . SHOULDER SURGERY Left      Family History  Problem Relation Age of Onset  . Colon cancer Paternal Grandfather   . Cancer Father   . Cancer Maternal Uncle   . Alzheimer's disease Paternal Aunt   . Cirrhosis Maternal Uncle   . Cancer Paternal Aunt        lung  . Aneurysm Mother        brain  . Heart disease Brother   . Heart attack Brother   . Mental illness Daughter   . ADD / ADHD Daughter   . Bipolar disorder Daughter   . Mental illness Son   . ADD / ADHD Son   . Bipolar disorder Son   . Mental illness Son   . ADD / ADHD Son   . Bipolar disorder Son     Social History   Social History Narrative   Patient lives at home with her husband but in the process of being separated now.On disability since age 64 yrs.   Social History   Tobacco Use  . Smoking status: Former Smoker    Packs/day: 0.20    Years: 32.00    Pack years: 6.40    Types: Cigarettes, Cigars    Start date: 07/05/1983    Quit date: 04/03/2012    Years since quitting: 7.9  . Smokeless tobacco: Never Used  Substance Use Topics  . Alcohol use: No    Alcohol/week: 0.0 standard drinks    Current Meds  Medication Sig  . ALPRAZolam (XANAX) 0.5 MG tablet Take 1 tablet (0.5 mg total) by  mouth 2 (two) times daily as needed.  [name] amLODipine (NORVASC) 10 MG tablet Take 1 tablet (10 mg total) by mouth daily.  [name] aspirin EC 81 MG tablet Take 2 tablets (162 mg total) by mouth 2 (two) times daily.  [name] atorvastatin (LIPITOR) 40 MG tablet Take 1 tablet (40 mg total) by mouth daily at 6 PM.  . bismuth-metronidazole-tetracycline (PYLERA) 140-125-125 MG capsule Take 3 capsules by mouth 4 (four) times  daily -  before meals and at bedtime.  . Cholecalciferol (VITAMIN D3) 25 MCG (1000 UT) CHEW Chew 2,000 Units by mouth daily.  . ciprofloxacin-dexamethasone (CIPRODEX) OTIC suspension   . clopidogrel (PLAVIX) 75 MG tablet Take 1 tablet (75 mg total) by mouth daily.  [name] escitalopram (LEXAPRO) 20 MG tablet Take 1 tablet (20 mg total) by mouth daily.  . fluticasone (FLONASE) 50 MCG/ACT nasal spray Place 1 spray into both nostrils daily.  . Fluticasone-Salmeterol (ADVAIR) 100-50 MCG/DOSE AEPB INHALE ONE (1) PUFF BY MOUTH TWICE DAILY  . isosorbide mononitrate (IMDUR) 30 MG 24 hr tablet Take 3 tablets (90 mg total) by mouth daily.  Sunday Corn Mineral Oil-Mineral Oil (RETAINE MGD OP) Apply 1 application to eye daily.   [name] LINZESS 145 MCG CAPS capsule Take 1 capsule (145 mcg total) by mouth daily.  [name] losartan (COZAAR) 100 MG tablet Take 1 tablet (100 mg total) by mouth daily.  . Medical ID Bracelet MISC 1 Units by Does not apply route daily.  . metoprolol tartrate (LOPRESSOR) 50 MG tablet Take 1.5 tablets (75 mg total) by mouth 2 (two) times daily.  . nitroGLYCERIN (NITROSTAT) 0.4 MG SL tablet Place 1 tablet (0.4 mg total) under the tongue every 5 (five) minutes as needed for chest pain.  . NONFORMULARY OR COMPOUNDED ITEM Inject 5 mg into the muscle every 7 (seven) days. Testosterone cypionate in olive  oil (25 mg/mL).  Dispense 5 mL vial.  . ondansetron (ZOFRAN ODT) 8 MG disintegrating tablet Take 1 tablet (8 mg total) by mouth every 8 (eight) hours as needed for nausea or vomiting.  . pantoprazole (PROTONIX)  40 MG tablet Take 1 tablet (40 mg total) by mouth 2 (two) times daily before a meal.  . potassium chloride 20 MEQ/15ML (10%) SOLN Take 15 mLs (20 mEq total) by mouth daily.  . prednisoLONE acetate (PRED FORTE) 1 % ophthalmic suspension Place 1 drop into both eyes at bedtime.   . Testosterone Propionate (FIRST-TESTOSTERONE MC) 2 % CREA Place 5 mg onto the skin daily.  [name] thyroid (NP THYROID) 120 MG tablet Take 1 tablet (120 mg total) by mouth daily before breakfast.  . triamcinolone (KENALOG) 0.1 % Apply 1 application topically 2 (two) times daily.  . [DISCONTINUED] ALPRAZolam (XANAX) 0.5 MG tablet Take 1 tablet (0.5 mg total) by mouth 2 (two) times daily as needed.  . [DISCONTINUED] predniSONE (DELTASONE) 20 MG tablet Take 2 tablets (40 mg total) by mouth daily with breakfast.   Current Facility-Administered Medications for the 03/19/20 encounter (Video Visit) with [name], MD  Medication  . NONFORMULARY OR COMPOUNDED ITEM 0.2 mL      Depression screen [facility] 2/9 08/15/2019 07/02/2019 03/14/2017  Decreased Interest 3 0 3  Down, Depressed, Hopeless 1 0 0  PHQ - 2 Score 4 0 3  Altered sleeping 1 - 3  Tired, decreased energy 3 - 3  Change in appetite 3 - 3  Feeling bad or failure about yourself  3 - 0  Trouble concentrating 1 - 0  Moving slowly or fidgety/restless 0 - 0  Suicidal thoughts 0 - 0  PHQ-9 Score 15 - 12  Difficult doing work/chores Very difficult - Very difficult  Some recent data might be hidden     Objective:   Today's Vitals: There were no vitals taken for this visit. Vitals with BMI 03/19/2020 03/04/2020 01/23/2020  Height (No Data) 5\' 4"  -  Weight (No Data) 164 lbs 13 oz -  BMI - 28.27 -  Systolic (No Data) 212 248  Diastolic (No Data) 94 92  Pulse - 75 61     Physical Exam   Her speech appears to be normal on the phone and she does not appear to be dyspneic at rest.    Assessment   1. Cough   2. Acute bronchitis, unspecified organism        Tests ordered No orders of the defined types were placed in this encounter.    Plan: 1. Her history sounds like she may well have bronchitis again and we will treat her empirically with Zithromax and prednisone.  If she gets worse, she will let me know. 2. This phone call lasted 5 minutes and 55 seconds.   Meds ordered this encounter  Medications  . azithromycin (ZITHROMAX) 250 MG tablet    Sig: Take 2 tablets the first day and then 1 tablet every day for the next 4 days    Dispense:  6 tablet    Refill:  0  . predniSONE (DELTASONE) 20 MG tablet    Sig: Take 2 tablets (40 mg total) by mouth daily with breakfast.    Dispense:  10 tablet    Refill:  1  . ALPRAZolam (XANAX) 0.5 MG tablet    Sig: Take 1 tablet (0.5 mg total) by mouth 2 (two) times daily as needed.    Dispense:  60 tablet    Refill:  3    Anmol Paschen Luther Parody, MD

## 2020-03-19 NOTE — Telephone Encounter (Signed)
I agree, she needs a telemedicine visit.  Thanks.

## 2020-03-19 NOTE — Telephone Encounter (Signed)
Will put her in 8AM slot but will have to do when you have a free moment.

## 2020-03-21 DIAGNOSIS — E119 Type 2 diabetes mellitus without complications: Secondary | ICD-10-CM | POA: Diagnosis not present

## 2020-03-25 ENCOUNTER — Ambulatory Visit (HOSPITAL_COMMUNITY)
Admission: RE | Admit: 2020-03-25 | Discharge: 2020-03-25 | Disposition: A | Discharge status: Home / Self Care | Payer: Medicare Other | Source: Ambulatory Visit | Attending: Internal Medicine | Admitting: Internal Medicine

## 2020-03-25 ENCOUNTER — Ambulatory Visit (INDEPENDENT_AMBULATORY_CARE_PROVIDER_SITE_OTHER): Payer: Medicare Other

## 2020-03-25 ENCOUNTER — Other Ambulatory Visit: Payer: Self-pay

## 2020-03-25 DIAGNOSIS — R928 Other abnormal and inconclusive findings on diagnostic imaging of breast: Secondary | ICD-10-CM

## 2020-03-25 DIAGNOSIS — N6002 Solitary cyst of left breast: Secondary | ICD-10-CM | POA: Diagnosis not present

## 2020-03-26 ENCOUNTER — Other Ambulatory Visit (INDEPENDENT_AMBULATORY_CARE_PROVIDER_SITE_OTHER): Payer: Self-pay | Admitting: Internal Medicine

## 2020-03-26 DIAGNOSIS — N63 Unspecified lump in unspecified breast: Secondary | ICD-10-CM

## 2020-03-26 NOTE — Progress Notes (Signed)
Pt is very upset to test. Tech agitated her so bad with her breast/ body. She want to go ahead to get it removed ASAP.

## 2020-03-31 ENCOUNTER — Telehealth (INDEPENDENT_AMBULATORY_CARE_PROVIDER_SITE_OTHER): Payer: Self-pay

## 2020-03-31 NOTE — Telephone Encounter (Signed)
Patient called and is extremely upset because she feels like she is getting the run around about having her mass removed from her breast as it is getting extremely uncomfortable. Patient stated that the general surgeon wants to have more consultations and not scheduling an appointment for her and she wants someone who can go in and remove the mass as soon as possible. Patient stated that she used [facility] in the past and wants to go back to them since they removed another mass not long ago. Please advise how to proceed to get this done ASAP as patient states she is in a lot of pain and needs something done ASAP. Thank you.

## 2020-03-31 NOTE — Telephone Encounter (Signed)
Bethany Wang, she certainly is welcome to call [facility] herself since they have seen her in the past.  Alternatively, if you can call and find out who she saw previously, speak to the front staff and see if we can get her in for an appointment as soon as possible.  Thanks.

## 2020-04-01 ENCOUNTER — Ambulatory Visit (INDEPENDENT_AMBULATORY_CARE_PROVIDER_SITE_OTHER): Payer: Medicare Other

## 2020-04-01 NOTE — Telephone Encounter (Signed)
Called patient and LMOVM to return call  Left a detailed voice message to see if patient had already called [facility] as advised yesterday.   Not sure if Nellie needs to place a new referral to [facility] for patient?

## 2020-04-02 NOTE — Telephone Encounter (Signed)
Pt is now schedule on With [facility]. Pt is aware and will be going soon. Pt is upset because they are making her do another consultation before procedure.

## 2020-04-02 NOTE — Telephone Encounter (Signed)
Thank you :)

## 2020-04-07 ENCOUNTER — Emergency Department (HOSPITAL_COMMUNITY)
Admission: EM | Admit: 2020-04-07 | Discharge: 2020-04-08 | Disposition: A | Discharge status: Home / Self Care | Payer: Medicare Other | Attending: Emergency Medicine | Admitting: Emergency Medicine

## 2020-04-07 ENCOUNTER — Encounter (HOSPITAL_COMMUNITY): Payer: Self-pay | Admitting: Emergency Medicine

## 2020-04-07 ENCOUNTER — Other Ambulatory Visit: Payer: Self-pay

## 2020-04-07 DIAGNOSIS — Z8542 Personal history of malignant neoplasm of other parts of uterus: Secondary | ICD-10-CM | POA: Diagnosis not present

## 2020-04-07 DIAGNOSIS — Z7901 Long term (current) use of anticoagulants: Secondary | ICD-10-CM | POA: Diagnosis not present

## 2020-04-07 DIAGNOSIS — Z853 Personal history of malignant neoplasm of breast: Secondary | ICD-10-CM | POA: Insufficient documentation

## 2020-04-07 DIAGNOSIS — I10 Essential (primary) hypertension: Secondary | ICD-10-CM | POA: Insufficient documentation

## 2020-04-07 DIAGNOSIS — R109 Unspecified abdominal pain: Secondary | ICD-10-CM | POA: Diagnosis not present

## 2020-04-07 DIAGNOSIS — N200 Calculus of kidney: Secondary | ICD-10-CM | POA: Insufficient documentation

## 2020-04-07 DIAGNOSIS — Z87891 Personal history of nicotine dependence: Secondary | ICD-10-CM | POA: Diagnosis not present

## 2020-04-07 DIAGNOSIS — Z79899 Other long term (current) drug therapy: Secondary | ICD-10-CM | POA: Insufficient documentation

## 2020-04-07 DIAGNOSIS — R1011 Right upper quadrant pain: Secondary | ICD-10-CM | POA: Diagnosis present

## 2020-04-07 DIAGNOSIS — Z7982 Long term (current) use of aspirin: Secondary | ICD-10-CM | POA: Insufficient documentation

## 2020-04-07 NOTE — ED Triage Notes (Signed)
Pt c/o upper abd pain and swelling x 4 days. Pt has hx of gallbladder and appendix problems. Pt c/o N/V/D.

## 2020-04-08 ENCOUNTER — Emergency Department (HOSPITAL_COMMUNITY): Payer: Medicare Other

## 2020-04-08 ENCOUNTER — Ambulatory Visit (INDEPENDENT_AMBULATORY_CARE_PROVIDER_SITE_OTHER): Payer: Medicare Other

## 2020-04-08 DIAGNOSIS — N200 Calculus of kidney: Secondary | ICD-10-CM | POA: Diagnosis not present

## 2020-04-08 DIAGNOSIS — R109 Unspecified abdominal pain: Secondary | ICD-10-CM | POA: Diagnosis not present

## 2020-04-08 LAB — CBC
HCT: 40.3 % (ref 36.0–46.0)
Hemoglobin: 13 g/dL (ref 12.0–15.0)
MCH: 27.6 pg (ref 26.0–34.0)
MCHC: 32.3 g/dL (ref 30.0–36.0)
MCV: 85.6 fL (ref 80.0–100.0)
Platelets: 264 10*3/uL (ref 150–400)
RBC: 4.71 MIL/uL (ref 3.87–5.11)
RDW: 14.4 % (ref 11.5–15.5)
WBC: 7.7 10*3/uL (ref 4.0–10.5)
nRBC: 0 % (ref 0.0–0.2)

## 2020-04-08 LAB — COMPREHENSIVE METABOLIC PANEL
ALT: 21 U/L (ref 0–44)
AST: 20 U/L (ref 15–41)
Albumin: 4 g/dL (ref 3.5–5.0)
Alkaline Phosphatase: 120 U/L (ref 38–126)
Anion gap: 9 (ref 5–15)
BUN: 7 mg/dL (ref 6–20)
CO2: 23 mmol/L (ref 22–32)
Calcium: 9.2 mg/dL (ref 8.9–10.3)
Chloride: 102 mmol/L (ref 98–111)
Creatinine, Ser: 0.76 mg/dL (ref 0.44–1.00)
GFR, Estimated: >60 mL/min (ref >60)
Glucose, Bld: 94 mg/dL (ref 70–99)
Potassium: 3.7 mmol/L (ref 3.5–5.1)
Sodium: 134 mmol/L — ABNORMAL LOW (ref 135–145)
Total Bilirubin: 0.6 mg/dL (ref 0.3–1.2)
Total Protein: 7.4 g/dL (ref 6.5–8.1)

## 2020-04-08 LAB — URINALYSIS, ROUTINE W REFLEX MICROSCOPIC
Bilirubin Urine: NEGATIVE
Glucose, UA: NEGATIVE mg/dL
Hgb urine dipstick: NEGATIVE
Ketones, ur: NEGATIVE mg/dL
Leukocytes,Ua: NEGATIVE
Nitrite: NEGATIVE
Protein, ur: NEGATIVE mg/dL
Specific Gravity, Urine: 1.005 (ref 1.005–1.030)
pH: 6 (ref 5.0–8.0)

## 2020-04-08 LAB — LIPASE, BLOOD: Lipase: 28 U/L (ref 11–51)

## 2020-04-08 MED ORDER — TAMSULOSIN HCL 0.4 MG PO CAPS
0.4000 mg | ORAL_CAPSULE | Freq: Every day | ORAL | 0 refills | Status: DC
Start: 2020-04-08 — End: 2020-04-14

## 2020-04-08 MED ORDER — KETOROLAC TROMETHAMINE 30 MG/ML IJ SOLN
15.0000 mg | Freq: Once | INTRAMUSCULAR | Status: AC
  Administered 2020-04-08: 05:00:00 15 mg via INTRAVENOUS
  Filled 2020-04-08: qty 1

## 2020-04-08 MED ORDER — LACTATED RINGERS IV BOLUS
1000.0000 mL | Freq: Once | INTRAVENOUS | Status: AC
  Administered 2020-04-08: 02:00:00 1000 mL via INTRAVENOUS

## 2020-04-08 MED ORDER — HYDROMORPHONE HCL 1 MG/ML IJ SOLN
1.0000 mg | Freq: Once | INTRAMUSCULAR | Status: AC
  Administered 2020-04-08: 02:00:00 1 mg via INTRAVENOUS
  Filled 2020-04-08: qty 1

## 2020-04-08 MED ORDER — IBUPROFEN 400 MG PO TABS: 400.0000 mg | ORAL_TABLET | Freq: Four times a day (QID) | ORAL | 0 refills | Status: DC | PRN

## 2020-04-08 MED ORDER — TAMSULOSIN HCL 0.4 MG PO CAPS
0.4000 mg | ORAL_CAPSULE | Freq: Once | ORAL | Status: AC
  Administered 2020-04-08: 05:00:00 0.4 mg via ORAL
  Filled 2020-04-08: qty 1

## 2020-04-08 MED ORDER — ONDANSETRON 4 MG PO TBDP: ORAL_TABLET | ORAL | 0 refills | Status: DC

## 2020-04-08 MED ORDER — OXYCODONE-ACETAMINOPHEN 5-325 MG PO TABS: 2.0000 | ORAL_TABLET | ORAL | 0 refills | Status: DC | PRN

## 2020-04-08 MED ORDER — OXYCODONE-ACETAMINOPHEN 5-325 MG PO TABS
1.0000 | ORAL_TABLET | Freq: Once | ORAL | Status: AC
  Administered 2020-04-08: 05:00:00 1 via ORAL
  Filled 2020-04-08: qty 1

## 2020-04-08 MED ORDER — ONDANSETRON HCL 4 MG PO TABS: 4.0000 mg | ORAL_TABLET | Freq: Three times a day (TID) | ORAL | 0 refills | Status: DC | PRN

## 2020-04-08 MED ORDER — ONDANSETRON HCL 4 MG/2ML IJ SOLN
4.0000 mg | Freq: Once | INTRAMUSCULAR | Status: AC
  Administered 2020-04-08: 02:00:00 4 mg via INTRAVENOUS
  Filled 2020-04-08: qty 2

## 2020-04-08 MED ORDER — IOHEXOL 300 MG/ML  SOLN
100.0000 mL | Freq: Once | INTRAMUSCULAR | Status: AC | PRN
  Administered 2020-04-08: 02:00:00 100 mL via INTRAVENOUS

## 2020-04-08 NOTE — ED Notes (Signed)
rec'd prepacks and d/c papers

## 2020-04-08 NOTE — ED Provider Notes (Signed)
[facility] EMERGENCY DEPARTMENT Provider Note   CSN: [id number] Arrival date & time: 04/07/20  1928     History Chief Complaint  Patient presents with  . Abdominal Pain    Bethany Wang is a 54 y.o. female.   Abdominal Pain Pain location:  RUQ Pain quality: sharp   Pain quality: not aching and not dull   Pain radiates to:  Does not radiate Pain severity:  Mild Timing:  Constant Progression:  Worsening Chronicity:  New Context: not alcohol use, not awakening from sleep, not medication withdrawal and not sick contacts        Past Medical History:  Diagnosis Date  . Anemia   . Anxiety   . Bilateral breast cysts 12/30/2015  . Brain tumor ([place])   . Breast cancer ([place])    Left Breast Cancer  . Breast disorder   . BV (bacterial vaginosis) 09/09/2015  . Coronary atherosclerosis of native coronary artery    a. s/p DES to RCA in 2013 b. DES to LAD in 2014 c. cath in 04/2016 showing patent LAD stent with D2 jailed by LAD stent and CTO of RCA with left to right collaterals present  . Cyst of pharynx or nasopharynx    [name]  . Decreased libido 12/25/2018  . Depression   . Essential hypertension   . Falls    x 3-4 in past year  . GERD (gastroesophageal reflux disease)   . Headache   . History of hematuria   . Mixed hyperlipidemia   . Personal history of chemotherapy    Left Breast Cancer  . Personal history of radiation therapy    Left Breast Cancer  . Pre-diabetes   . PUD (peptic ulcer disease)   . Seizures ([place])   . Shingles 09/09/2015  . Stroke ([place])    12-2017 on Plavix, only deficit is headaches  . Suicide attempt ([place])    3 attempts in remote past  . Trichimoniasis 08/20/2019   Treated 08/20/19   . Uterine cancer ([place])   . Vitamin D deficiency disease 04/03/2019    Patient Active Problem List   Diagnosis Date Noted  . Trichimoniasis 08/20/2019  . Screening examination for STD (sexually transmitted disease) 08/15/2019  . Vaginal  itching 08/15/2019  . Hypertension 08/15/2019  . Vitamin D deficiency disease 04/03/2019  . Decreased libido 12/25/2018  . Angina pectoris ([place]) 10/25/2018  . Palpitations 06/06/2018  . Chest pain 07/13/2017  . Nonspecific chest pain 07/13/2017  . Mixed hyperlipidemia 07/13/2017  . Precordial chest pain   . Hypertensive urgency 06/15/2016  . Unstable angina pectoris ([place]) 06/15/2016  . Unstable angina ([place]) 06/15/2016  . Bilateral breast cysts 12/30/2015  . Vaginal discharge 09/09/2015  . BV (bacterial vaginosis) 09/09/2015  . Shingles 09/09/2015  . History of breast cancer 09/09/2015  . Syncope and collapse 08/08/2015  . Migraine with aura and with status migrainosus, not intractable 08/08/2015  . Chronic low back pain 08/08/2015  . Insomnia 08/08/2015  . Anxiety state 08/08/2015  . Panic attacks 08/08/2015  . Anemia, iron deficiency 08/08/2015  . Pica 08/08/2015  . Angina decubitus ([place]) 07/03/2014  . 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 Encounter for gynecological examination with Papanicolaou smear of cervix 02/04/2014  . Syncope 12/14/2013  . Cervical disc disorder with radiculopathy of cervical region 02/19/2013  . H/O rotator cuff surgery 02/19/2013  . Dyspareunia 02/01/2013  . Left shoulder pain 01/24/2013  . Rotator cuff tear 01/24/2013  . Radicular pain 01/24/2013  .  Labral tear of shoulder 01/24/2013  . Complex regional pain syndrome of upper extremity 01/24/2013  . Herniated disc, cervical 01/24/2013  . Nausea and vomiting 01/18/2013  . Rectal bleeding 01/18/2013  . Edema of left foot 10/16/2012  . Coronary atherosclerosis of native coronary artery   . Essential hypertension, benign   . HLD (hyperlipidemia)   . Tobacco abuse     Past Surgical History:  Procedure Laterality Date  . ABDOMINAL HYSTERECTOMY    . BIOPSY  11/28/2019   Procedure: BIOPSY;  Surgeon: Rogene Houston, MD;  [facility];  Service: Endoscopy;;  antral, gastric  body   . BLADDER SURGERY    . BREAST LUMPECTOMY Left   . BREAST LUMPECTOMY Right 02/24/2018   Procedure: RIGHT BREAST LUMPECTOMY ERAS PATHWAY;  Surgeon: Jovita Kussmaul, MD;  Location: [place];  Service: General;  Laterality: Right;  . CARDIAC CATHETERIZATION N/A 05/10/2016   Procedure: Left Heart Cath and Coronary Angiography;  Surgeon: Belva Crome, MD;  Location: [place] CV LAB;  Service: Cardiovascular;  Laterality: N/A;  . COLONOSCOPY  May 2010   Fleishman: normal rectum, internal hemorrhoids, , benign colonic polyp  . COLONOSCOPY N/A 01/05/2017   Procedure: COLONOSCOPY;  Surgeon: Rogene Houston, MD;  [facility];  Service: Endoscopy;  Laterality: N/A;  1:00  . ESOPHAGEAL DILATION  11/28/2019   Procedure: ESOPHAGEAL DILATION;  Surgeon: Rogene Houston, MD;  [facility];  Service: Endoscopy;;  . ESOPHAGOGASTRODUODENOSCOPY     2001 Dr. Amedeo Plenty: distal esophagitis, small hiatal hernia,. Dr. Tamala Julian 2006? no records available currently, pt also reports  EGD a few years ago with Dr. Gala Romney, do not have these reports anywhere in medical records  . ESOPHAGOGASTRODUODENOSCOPY  06/02/2011   [id number] pill impaction as described above s/p dilation of a probable cervical esophageal web/bx abnormal esophageal and gastric mucosa. + H.pylori gastritis   . ESOPHAGOGASTRODUODENOSCOPY (EGD) WITH PROPOFOL N/A 11/28/2019   Procedure: ESOPHAGOGASTRODUODENOSCOPY (EGD) WITH PROPOFOL;  Surgeon: Rogene Houston, MD;  [facility];  Service: Endoscopy;  Laterality: N/A;  1020  . EYE SURGERY     Removed glass  . HAND SURGERY Right   . Left breast lumpectomy     Benign  . LEFT HEART CATH AND CORONARY ANGIOGRAPHY N/A 10/25/2018   Procedure: LEFT HEART CATH AND CORONARY ANGIOGRAPHY;  Surgeon: Wellington Hampshire, MD;  Location: [place] CV LAB;  Service: Cardiovascular;  Laterality: N/A;  . LEFT HEART CATHETERIZATION WITH CORONARY ANGIOGRAM N/A 07/03/2014   Procedure: LEFT HEART  CATHETERIZATION WITH CORONARY ANGIOGRAM;  Surgeon: Belva Crome, MD;  Location: [facility] CATH LAB;  Service: Cardiovascular;  Laterality: N/A;  . POLYPECTOMY  01/05/2017   Procedure: POLYPECTOMY;  Surgeon: Rogene Houston, MD;  [facility];  Service: Endoscopy;;  colon  . SHOULDER SURGERY Left      OB History    Gravida  4   Para  3   Term      Preterm      AB  1   Living  3     SAB  1   IAB      Ectopic      Multiple      Live Births              Family History  Problem Relation Age of Onset  . Colon cancer Paternal Grandfather   . Cancer Father   . Cancer Maternal Uncle   .  Alzheimer's disease Paternal Aunt   . Cirrhosis Maternal Uncle   . Cancer Paternal Aunt        lung  . Aneurysm Mother        brain  . Heart disease Brother   . Heart attack Brother   . Mental illness Daughter   . ADD / ADHD Daughter   . Bipolar disorder Daughter   . Mental illness Son   . ADD / ADHD Son   . Bipolar disorder Son   . Mental illness Son   . ADD / ADHD Son   . Bipolar disorder Son     Social History   Tobacco Use  . Smoking status: Former Smoker    Packs/day: 0.20    Years: 32.00    Pack years: 6.40    Types: Cigarettes, Cigars    Start date: 07/05/1983    Quit date: 04/03/2012    Years since quitting: 8.0  . Smokeless tobacco: Never Used  Vaping Use  . Vaping Use: Never used  Substance Use Topics  . Alcohol use: No    Alcohol/week: 0.0 standard drinks  . Drug use: No    Home Medications Prior to Admission medications   Medication Sig Start Date End Date Taking? Authorizing Provider  ibuprofen (ADVIL) 400 MG tablet Take 1 tablet (400 mg total) by mouth every 6 (six) hours as needed. 04/08/20  Yes Nyella Eckels, Corene Cornea, MD  ondansetron (ZOFRAN ODT) 4 MG disintegrating tablet 4mg  ODT q4 hours prn nausea/vomit 04/08/20  Yes Yavuz Kirby, Corene Cornea, MD  ondansetron (ZOFRAN) 4 MG tablet Take 1 tablet (4 mg total) by mouth every 8 (eight) hours as needed for nausea  or vomiting. 04/08/20  Yes Aran Menning, Corene Cornea, MD  oxyCODONE-acetaminophen (PERCOCET) 5-325 MG tablet Take 2 tablets by mouth every 4 (four) hours as needed. 04/08/20  Yes Eloisa Chokshi, Corene Cornea, MD  oxyCODONE-acetaminophen (PERCOCET/ROXICET) 5-325 MG tablet Take 2 tablets by mouth every 4 (four) hours as needed for severe pain. 04/08/20  Yes Abdulhadi Stopa, Corene Cornea, MD  tamsulosin (FLOMAX) 0.4 MG CAPS capsule Take 1 capsule (0.4 mg total) by mouth daily. 04/08/20  Yes Alzora Ha, Corene Cornea, MD  ALPRAZolam Duanne Moron) 0.5 MG tablet Take 1 tablet (0.5 mg total) by mouth 2 (two) times daily as needed. 03/19/20   [name], MD  amLODipine (NORVASC) 10 MG tablet Take 1 tablet (10 mg total) by mouth daily. 11/19/19   [name], MD  aspirin EC 81 MG tablet Take 2 tablets (162 mg total) by mouth 2 (two) times daily. 12/01/19   Rogene Houston, MD  atorvastatin (LIPITOR) 40 MG tablet Take 1 tablet (40 mg total) by mouth daily at 6 PM. 12/31/19   Satira Sark, MD  azithromycin (ZITHROMAX) 250 MG tablet Take 2 tablets the first day and then 1 tablet every day for the next 4 days 03/19/20   [name], MD  bismuth-metronidazole-tetracycline [facility]) [phone] MG capsule Take 3 capsules by mouth 4 (four) times daily -  before meals and at bedtime. 11/30/19   Rogene Houston, MD  Cholecalciferol (VITAMIN D3) 25 MCG (1000 UT) CHEW Chew 2,000 Units by mouth daily. 11/19/19   [name], MD  ciprofloxacin-dexamethasone (CIPRODEX) OTIC suspension  12/14/19   [provider]  clopidogrel (PLAVIX) 75 MG tablet Take 1 tablet (75 mg total) by mouth daily. 12/31/19   Satira Sark, MD  escitalopram (LEXAPRO) 20 MG tablet Take 1 tablet (20 mg total) by mouth daily. 11/19/19   Gosrani, Nimish C,  MD  fluticasone (FLONASE) 50 MCG/ACT nasal spray Place 1 spray into both nostrils daily. 03/13/19   Kendrick, Caitlyn S, PA-C  Fluticasone-Salmeterol (ADVAIR) 100-50 MCG/DOSE AEPB INHALE ONE (1) PUFF BY MOUTH TWICE DAILY 01/02/20    Lilly Cove C, MD  isosorbide mononitrate (IMDUR) 30 MG 24 hr tablet Take 3 tablets (90 mg total) by mouth daily. 12/31/19   Jonelle Sidle, MD  Light Mineral Oil-Mineral Oil (RETAINE MGD OP) Apply 1 application to eye daily.     [provider]  LINZESS 145 MCG CAPS capsule Take 1 capsule (145 mcg total) by mouth daily. 11/19/19   Wilson Singer, MD  losartan (COZAAR) 100 MG tablet Take 1 tablet (100 mg total) by mouth daily. 12/31/19   Jonelle Sidle, MD  Medical ID Bracelet MISC 1 Units by Does not apply route daily. 03/06/19   Wilson Singer, MD  metoprolol tartrate (LOPRESSOR) 50 MG tablet Take 1.5 tablets (75 mg total) by mouth 2 (two) times daily. 12/31/19   Jonelle Sidle, MD  nitroGLYCERIN (NITROSTAT) 0.4 MG SL tablet Place 1 tablet (0.4 mg total) under the tongue every 5 (five) minutes as needed for chest pain. 12/31/19   Jonelle Sidle, MD  NONFORMULARY OR COMPOUNDED ITEM Inject 5 mg into the muscle every 7 (seven) days. Testosterone cypionate in olive  oil (25 mg/mL).  Dispense 5 mL vial. 06/11/19   Gosrani, Nimish C, MD  pantoprazole (PROTONIX) 40 MG tablet Take 1 tablet (40 mg total) by mouth 2 (two) times daily before a meal. 11/19/19   Gosrani, Nimish C, MD  potassium chloride 20 MEQ/15ML (10%) SOLN Take 15 mLs (20 mEq total) by mouth daily. 12/31/19   Jonelle Sidle, MD  prednisoLONE acetate (PRED FORTE) 1 % ophthalmic suspension Place 1 drop into both eyes at bedtime.  10/23/19   [provider]  predniSONE (DELTASONE) 20 MG tablet Take 2 tablets (40 mg total) by mouth daily with breakfast. 03/19/20   Wilson Singer, MD  Testosterone Propionate (FIRST-TESTOSTERONE MC) 2 % CREA Place 5 mg onto the skin daily. 09/04/19   Wilson Singer, MD  thyroid (NP THYROID) 120 MG tablet Take 1 tablet (120 mg total) by mouth daily before breakfast. 11/19/19   Karilyn Cota, Nimish C, MD  triamcinolone (KENALOG) 0.1 % Apply 1 application topically 2 (two) times daily.  03/04/20   Wilson Singer, MD    Allergies    Bee venom, Penicillins, and Shellfish allergy  Review of Systems   Review of Systems  Gastrointestinal: Positive for abdominal pain.  All other systems reviewed and are negative.   Physical Exam Updated Vital Signs BP (!) 153/90   Pulse 67   Temp 98.5 F (36.9 C) (Oral)   Resp 20   Ht 5\' 4"  (1.626 m)   Wt 64.9 kg   SpO2 99%   BMI 24.55 kg/m   Physical Exam Vitals and nursing note reviewed.  Constitutional:      Appearance: She is well-developed and well-nourished.  HENT:     Head: Normocephalic and atraumatic.     Mouth/Throat:     Mouth: Mucous membranes are moist.     Pharynx: Oropharynx is clear.  Eyes:     Pupils: Pupils are equal, round, and reactive to light.  Cardiovascular:     Rate and Rhythm: Normal rate and regular rhythm.  Pulmonary:     Effort: Pulmonary effort is normal. No respiratory distress.     Breath sounds:  No stridor.  Abdominal:     General: Abdomen is flat. There is no distension.     Tenderness: There is abdominal tenderness (mild ruq and right flank).  Musculoskeletal:        General: No swelling or tenderness. Normal range of motion.     Cervical back: Normal range of motion.  Skin:    General: Skin is warm and dry.  Neurological:     General: No focal deficit present.     Mental Status: She is alert.     ED Results / Procedures / Treatments   Labs (all labs ordered are listed, but only abnormal results are displayed) Labs Reviewed  COMPREHENSIVE METABOLIC PANEL - Abnormal; Notable for the following components:      Result Value   Sodium 134 (*)    All other components within normal limits  LIPASE, BLOOD  CBC  URINALYSIS, ROUTINE W REFLEX MICROSCOPIC    EKG None  Radiology CT ABDOMEN PELVIS W CONTRAST  Result Date: 04/08/2020 CLINICAL DATA:  Abdominal pain EXAM: CT ABDOMEN AND PELVIS WITH CONTRAST TECHNIQUE: Multidetector CT imaging of the abdomen and pelvis was  performed using the standard protocol following bolus administration of intravenous contrast. CONTRAST:  166mL OMNIPAQUE IOHEXOL 300 MG/ML  SOLN COMPARISON:  June 19, 2018 FINDINGS: Lower chest: The visualized heart size within normal limits. No pericardial fluid/thickening. No hiatal hernia. The visualized portions of the lungs are clear. Hepatobiliary: Again noted are multiple low-density lesions seen throughout the liver the largest measuring 3 cm within the anterior left liver lobe with some peripheral puddling, likely hemangioma. Main portal vein is patent. The patient is status post cholecystectomy. No biliary ductal dilation. Pancreas: Unremarkable. No pancreatic ductal dilatation or surrounding inflammatory changes. Spleen: Normal in size without focal abnormality. Adrenals/Urinary Tract: Both adrenal glands appear normal. Mild right pelvicaliectasis and proximal ureterectasis is noted to the level of the mid ureter where there is a 3 mm calculus present. No left-sided renal or collecting system calculi. The bladder is unremarkable. Stomach/Bowel: The stomach, small bowel, and colon are normal in appearance. No inflammatory changes, wall thickening, or obstructive findings.A moderate amount colonic stool is present. Vascular/Lymphatic: There are no enlarged mesenteric, retroperitoneal, or pelvic lymph nodes. Scattered aortic atherosclerotic calcifications are seen without aneurysmal dilatation. Reproductive: The patient is status post hysterectomy. Other: No evidence of abdominal wall mass or hernia. Musculoskeletal: No acute or significant osseous findings. IMPRESSION: Mild right pelviectasis to the level of the mid ureter where there is a 3 mm calculus present. Electronically Signed   By: Prudencio Pair M.D.   On: 04/08/2020 02:56    Procedures Procedures (including critical care time)  Medications Ordered in ED Medications  HYDROmorphone (DILAUDID) injection 1 mg (1 mg Intravenous Given 04/08/20  0142)  lactated ringers bolus 1,000 mL (0 mLs Intravenous Stopped 04/08/20 0458)  ondansetron (ZOFRAN) injection 4 mg (4 mg Intravenous Given 04/08/20 0142)  iohexol (OMNIPAQUE) 300 MG/ML solution 100 mL (100 mLs Intravenous Contrast Given 04/08/20 0218)  tamsulosin (FLOMAX) capsule 0.4 mg (0.4 mg Oral Given 04/08/20 0435)  oxyCODONE-acetaminophen (PERCOCET/ROXICET) 5-325 MG per tablet 1 tablet (1 tablet Oral Given 04/08/20 0435)  ketorolac (TORADOL) 30 MG/ML injection 15 mg (15 mg Intravenous Given 04/08/20 0434)    ED Course  I have reviewed the triage vital signs and the nursing notes.  Pertinent labs & imaging results that were available during my care of the patient were reviewed by me and considered in my medical decision making (see  chart for details).    MDM Rules/Calculators/A&P                          Ultimately the patient was found to have a kidney stone with hydronephrosis and some pelviectasis with it. Pain improved with treatments here. Tolerating p.o. No other indication for further imaging, work-up or hospitalization at this time. Patient appears to be stable for discharge with urology follow-up as needed if this does not improve.  Final Clinical Impression(s) / ED Diagnoses Final diagnoses:  Kidney stone    Rx / DC Orders ED Discharge Orders         Ordered    ondansetron (ZOFRAN) 4 MG tablet  Every 8 hours PRN        04/08/20 0411    oxyCODONE-acetaminophen (PERCOCET/ROXICET) 5-325 MG tablet  Every 4 hours PRN        04/08/20 0411    oxyCODONE-acetaminophen (PERCOCET) 5-325 MG tablet  Every 4 hours PRN        04/08/20 0415    ondansetron (ZOFRAN ODT) 4 MG disintegrating tablet        04/08/20 0415    tamsulosin (FLOMAX) 0.4 MG CAPS capsule  Daily        04/08/20 0415    ibuprofen (ADVIL) 400 MG tablet  Every 6 hours PRN        04/08/20 0415           Searra Carnathan, Corene Cornea, MD 04/08/20 [phone]

## 2020-04-09 MED FILL — Oxycodone w/ Acetaminophen Tab 5-325 MG: ORAL | Qty: 6 | Status: AC

## 2020-04-14 ENCOUNTER — Telehealth (INDEPENDENT_AMBULATORY_CARE_PROVIDER_SITE_OTHER): Payer: Self-pay

## 2020-04-14 ENCOUNTER — Telehealth (INDEPENDENT_AMBULATORY_CARE_PROVIDER_SITE_OTHER): Payer: Medicare Other | Admitting: Nurse Practitioner

## 2020-04-14 ENCOUNTER — Encounter (INDEPENDENT_AMBULATORY_CARE_PROVIDER_SITE_OTHER): Payer: Self-pay | Admitting: Nurse Practitioner

## 2020-04-14 ENCOUNTER — Other Ambulatory Visit (INDEPENDENT_AMBULATORY_CARE_PROVIDER_SITE_OTHER): Payer: Self-pay

## 2020-04-14 VITALS — Ht 64.0 in

## 2020-04-14 DIAGNOSIS — R350 Frequency of micturition: Secondary | ICD-10-CM | POA: Diagnosis not present

## 2020-04-14 DIAGNOSIS — N200 Calculus of kidney: Secondary | ICD-10-CM | POA: Diagnosis not present

## 2020-04-14 MED ORDER — OXYCODONE-ACETAMINOPHEN 5-325 MG PO TABS: 1.0000 | ORAL_TABLET | Freq: Four times a day (QID) | ORAL | 0 refills | Status: AC | PRN

## 2020-04-14 MED ORDER — TAMSULOSIN HCL 0.4 MG PO CAPS: 0.4000 mg | ORAL_CAPSULE | Freq: Every day | ORAL | 0 refills | Status: DC

## 2020-04-14 NOTE — Telephone Encounter (Signed)
Please ask Maralyn Sago to take care of this since I'm not in the office, thanks.

## 2020-04-14 NOTE — Telephone Encounter (Signed)
Patient called and stated that she called  Urology and she stated that they were very rude to her and told her they were not scheduling patients and that she needed to go to [facility]. Patient was very upset and is requesting to be sent to [facility] ASAP per Hospital Discharge paperwork from current Kidney Stone and stomach swelling.  Can you send this referral to [facility] ASAP?   Thank you!!

## 2020-04-14 NOTE — Telephone Encounter (Signed)
I sent and attached Sarah as well. Thank you

## 2020-04-14 NOTE — Progress Notes (Signed)
Due to national recommendations of social distancing related to the [place] pandemic, an audio-only tele-health visit was felt to be the most appropriate encounter type for this patient today. I connected with  BONITA GIORDANI on 04/14/20 utilizing audio-only technology and verified that I am speaking with the correct person using two identifiers. The patient was located at their home, and I was located at home during the encounter. I discussed the limitations of evaluation and management by telemedicine. The patient expressed understanding and agreed to proceed.    Subjective:  Patient ID: Leda Min, female    DOB: [birth year]  Age: 55 y.o. MRN: [id number]  CC:  Chief Complaint  Patient presents with  . Hospitalization Follow-up    Patient has a kidney stone, patient stated that she is having stomach swelling and needs help until she can get into Urology      HPI  This patient arrives today for virtual visit for the above.  She tells me that she is experiencing severe pain in her abdomen that radiates to her back.  She also tells me she has noticed some swelling in her stomach.  She was recently discharged in the emergency department and was noted to have a kidney stone.  She was referred to urology, but tells me when she called to get the appointment she was told she would have to schedule an appointment in [place] and she would prefer to schedule an appointment in [place].  We did call [facility] in [place] and they requested that referral to urology be ordered.  This was ordered today.  Now, she tells me that she is drinking a lot of fluids but is experiencing nausea when eating food.  She tells me she is urinating but tells me that she is going to the bathroom every 5 to 10 minutes does not feel like she is urinating very much.  Past Medical History:  Diagnosis Date  . Anemia   . Anxiety   . Bilateral breast cysts 12/30/2015  . Brain tumor ([place])   . Breast cancer  ([place])    Left Breast Cancer  . Breast disorder   . BV (bacterial vaginosis) 09/09/2015  . Coronary atherosclerosis of native coronary artery    a. s/p DES to RCA in 2013 b. DES to LAD in 2014 c. cath in 04/2016 showing patent LAD stent with D2 jailed by LAD stent and CTO of RCA with left to right collaterals present  . Cyst of pharynx or nasopharynx    [name]  . Decreased libido 12/25/2018  . Depression   . Essential hypertension   . Falls    x 3-4 in past year  . GERD (gastroesophageal reflux disease)   . Headache   . History of hematuria   . Mixed hyperlipidemia   . Personal history of chemotherapy    Left Breast Cancer  . Personal history of radiation therapy    Left Breast Cancer  . Pre-diabetes   . PUD (peptic ulcer disease)   . Seizures ([place])   . Shingles 09/09/2015  . Stroke ([place])    12-2017 on Plavix, only deficit is headaches  . Suicide attempt ([place])    3 attempts in remote past  . Trichimoniasis 08/20/2019   Treated 08/20/19   . Uterine cancer ([place])   . Vitamin D deficiency disease 04/03/2019      Family History  Problem Relation Age of Onset  . Colon cancer Paternal Grandfather   . Cancer  Father   . Cancer Maternal Uncle   . Alzheimer's disease Paternal Aunt   . Cirrhosis Maternal Uncle   . Cancer Paternal Aunt        lung  . Aneurysm Mother        brain  . Heart disease Brother   . Heart attack Brother   . Mental illness Daughter   . ADD / ADHD Daughter   . Bipolar disorder Daughter   . Mental illness Son   . ADD / ADHD Son   . Bipolar disorder Son   . Mental illness Son   . ADD / ADHD Son   . Bipolar disorder Son     Social History   Social History Narrative   Patient lives at home with her husband but in the process of being separated now.On disability since age 53 yrs.   Social History   Tobacco Use  . Smoking status: Former Smoker    Packs/day: 0.20    Years: 32.00    Pack years: 6.40    Types: Cigarettes,  Cigars    Start date: 07/05/1983    Quit date: 04/03/2012    Years since quitting: 8.0  . Smokeless tobacco: Never Used  Substance Use Topics  . Alcohol use: No    Alcohol/week: 0.0 standard drinks     Current Meds  Medication Sig  . ALPRAZolam (XANAX) 0.5 MG tablet Take 1 tablet (0.5 mg total) by mouth 2 (two) times daily as needed.  [name] amLODipine (NORVASC) 10 MG tablet Take 1 tablet (10 mg total) by mouth daily.  [name] aspirin EC 81 MG tablet Take 2 tablets (162 mg total) by mouth 2 (two) times daily.  [name] atorvastatin (LIPITOR) 40 MG tablet Take 1 tablet (40 mg total) by mouth daily at 6 PM.  . bismuth-metronidazole-tetracycline (PYLERA) 140-125-125 MG capsule Take 3 capsules by mouth 4 (four) times daily -  before meals and at bedtime.  . Cholecalciferol (VITAMIN D3) 25 MCG (1000 UT) CHEW Chew 2,000 Units by mouth daily.  . ciprofloxacin-dexamethasone (CIPRODEX) OTIC suspension   . clopidogrel (PLAVIX) 75 MG tablet Take 1 tablet (75 mg total) by mouth daily.  [name] escitalopram (LEXAPRO) 20 MG tablet Take 1 tablet (20 mg total) by mouth daily.  . fluticasone (FLONASE) 50 MCG/ACT nasal spray Place 1 spray into both nostrils daily.  . Fluticasone-Salmeterol (ADVAIR) 100-50 MCG/DOSE AEPB INHALE ONE (1) PUFF BY MOUTH TWICE DAILY  . ibuprofen (ADVIL) 400 MG tablet Take 1 tablet (400 mg total) by mouth every 6 (six) hours as needed.  . isosorbide mononitrate (IMDUR) 30 MG 24 hr tablet Take 3 tablets (90 mg total) by mouth daily.  Sunday Corn Mineral Oil-Mineral Oil (RETAINE MGD OP) Apply 1 application to eye daily.   [name] LINZESS 145 MCG CAPS capsule Take 1 capsule (145 mcg total) by mouth daily.  [name] losartan (COZAAR) 100 MG tablet Take 1 tablet (100 mg total) by mouth daily.  . Medical ID Bracelet MISC 1 Units by Does not apply route daily.  . metoprolol tartrate (LOPRESSOR) 50 MG tablet Take 1.5 tablets (75 mg total) by mouth 2 (two) times daily.  . nitroGLYCERIN (NITROSTAT) 0.4 MG SL tablet Place 1  tablet (0.4 mg total) under the tongue every 5 (five) minutes as needed for chest pain.  . NONFORMULARY OR COMPOUNDED ITEM Inject 5 mg into the muscle every 7 (seven) days. Testosterone cypionate in olive  oil (25 mg/mL).  Dispense 5 mL vial.  . ondansetron (ZOFRAN ODT)  4 MG disintegrating tablet 4mg  ODT q4 hours prn nausea/vomit  . ondansetron (ZOFRAN) 4 MG tablet Take 1 tablet (4 mg total) by mouth every 8 (eight) hours as needed for nausea or vomiting.  . pantoprazole (PROTONIX) 40 MG tablet Take 1 tablet (40 mg total) by mouth 2 (two) times daily before a meal.  . potassium chloride 20 MEQ/15ML (10%) SOLN Take 15 mLs (20 mEq total) by mouth daily.  . prednisoLONE acetate (PRED FORTE) 1 % ophthalmic suspension Place 1 drop into both eyes at bedtime.   . predniSONE (DELTASONE) 20 MG tablet Take 2 tablets (40 mg total) by mouth daily with breakfast.  . Testosterone Propionate (FIRST-TESTOSTERONE MC) 2 % CREA Place 5 mg onto the skin daily.  [name] thyroid (NP THYROID) 120 MG tablet Take 1 tablet (120 mg total) by mouth daily before breakfast.  . triamcinolone (KENALOG) 0.1 % Apply 1 application topically 2 (two) times daily.  . [DISCONTINUED] oxyCODONE-acetaminophen (PERCOCET) 5-325 MG tablet Take 2 tablets by mouth every 4 (four) hours as needed.  . [DISCONTINUED] oxyCODONE-acetaminophen (PERCOCET/ROXICET) 5-325 MG tablet Take 2 tablets by mouth every 4 (four) hours as needed for severe pain.  . [DISCONTINUED] tamsulosin (FLOMAX) 0.4 MG CAPS capsule Take 1 capsule (0.4 mg total) by mouth daily.   Current Facility-Administered Medications for the 04/14/20 encounter (Video Visit) with Ailene Ards, NP  Medication  . NONFORMULARY OR COMPOUNDED ITEM 0.2 mL    ROS:  See HPI   Objective:   Today's Vitals: Ht 5\' 4"  (1.626 m)   BMI 24.55 kg/m  Vitals with BMI 04/14/2020 04/08/2020 04/08/2020  Height 5\' 4"  - -  Weight - - -  BMI - - -  Systolic - - 0000000  Diastolic - - 90  Pulse - 67 65      Physical Exam Comprehensive physical exam not conducted today as office visit was conducted remotely.  Patient sounded well over the phone she was alert and oriented and appeared to have appropriate judgment and thought processes.      Assessment and Plan   1. Kidney stone   2. Urinary frequency      Plan: 1.,  2.  I am concerned that the patient is experiencing bladder outlet obstruction.  She tells me she is almost out of her Flomax and her pain medicine.  I will refill these for her, but told her that I am very concerned she may be experiencing urinary retention and may be at risk for her bladder rupturing.  I did tell her that if her bladder were to rupture this could be a life-threatening event and that I recommended she seriously consider going to the emergency department for further evaluation and management.  We have ordered referral to urology and this is being worked on by [facility].  She tells me she understands and will consider this.   Tests ordered No orders of the defined types were placed in this encounter.     Meds ordered this encounter  Medications  . tamsulosin (FLOMAX) 0.4 MG CAPS capsule    Sig: Take 1 capsule (0.4 mg total) by mouth daily.    Dispense:  30 capsule    Refill:  0    Order Specific Question:   Supervising Provider    Answer:   Hurshel Party C [id number]  . oxyCODONE-acetaminophen (PERCOCET) 5-325 MG tablet    Sig: Take 1 tablet by mouth every 6 (six) hours as needed for up to 3 days for moderate  pain or severe pain.    Dispense:  12 tablet    Refill:  0    Order Specific Question:   Supervising Provider    Answer:   Wilson Singer [[id number]]    Patient to follow-up in as scheduled or sooner as needed.  This telephone conversation lasted for 11 minutes.  Elenore Paddy, NP

## 2020-04-14 NOTE — Progress Notes (Signed)
Per previous telephone note. Pt has a kidney stone and abdomen is swelling and in pain. Would like to be seen in the [facility] office ASAP. Pt stated [facility] office was very rude and would not accept her as a patient. Told her to go to [facility]. Unclear of details. Please advise and pass to provider & office manager on the details.

## 2020-04-28 ENCOUNTER — Other Ambulatory Visit: Payer: Self-pay | Admitting: Cardiology

## 2020-04-28 ENCOUNTER — Other Ambulatory Visit (INDEPENDENT_AMBULATORY_CARE_PROVIDER_SITE_OTHER): Payer: Self-pay | Admitting: Internal Medicine

## 2020-04-28 DIAGNOSIS — I25119 Atherosclerotic heart disease of native coronary artery with unspecified angina pectoris: Secondary | ICD-10-CM

## 2020-05-14 ENCOUNTER — Other Ambulatory Visit: Payer: Self-pay | Admitting: General Surgery

## 2020-05-15 ENCOUNTER — Encounter (HOSPITAL_COMMUNITY): Payer: Self-pay | Admitting: *Deleted

## 2020-05-15 ENCOUNTER — Emergency Department (HOSPITAL_COMMUNITY): Payer: 59

## 2020-05-15 ENCOUNTER — Emergency Department (HOSPITAL_COMMUNITY)
Admission: EM | Admit: 2020-05-15 | Discharge: 2020-05-15 | Disposition: A | Discharge status: Home / Self Care | Payer: 59 | Attending: Emergency Medicine | Admitting: Emergency Medicine

## 2020-05-15 ENCOUNTER — Other Ambulatory Visit: Payer: Self-pay

## 2020-05-15 DIAGNOSIS — R110 Nausea: Secondary | ICD-10-CM | POA: Diagnosis not present

## 2020-05-15 DIAGNOSIS — Z87891 Personal history of nicotine dependence: Secondary | ICD-10-CM | POA: Diagnosis not present

## 2020-05-15 DIAGNOSIS — Z7982 Long term (current) use of aspirin: Secondary | ICD-10-CM | POA: Diagnosis not present

## 2020-05-15 DIAGNOSIS — I2510 Atherosclerotic heart disease of native coronary artery without angina pectoris: Secondary | ICD-10-CM | POA: Insufficient documentation

## 2020-05-15 DIAGNOSIS — I10 Essential (primary) hypertension: Secondary | ICD-10-CM | POA: Diagnosis not present

## 2020-05-15 DIAGNOSIS — R1012 Left upper quadrant pain: Secondary | ICD-10-CM | POA: Diagnosis not present

## 2020-05-15 DIAGNOSIS — Z79899 Other long term (current) drug therapy: Secondary | ICD-10-CM | POA: Insufficient documentation

## 2020-05-15 DIAGNOSIS — Z853 Personal history of malignant neoplasm of breast: Secondary | ICD-10-CM | POA: Insufficient documentation

## 2020-05-15 DIAGNOSIS — R1084 Generalized abdominal pain: Secondary | ICD-10-CM | POA: Insufficient documentation

## 2020-05-15 DIAGNOSIS — R1011 Right upper quadrant pain: Secondary | ICD-10-CM | POA: Diagnosis not present

## 2020-05-15 DIAGNOSIS — R1031 Right lower quadrant pain: Secondary | ICD-10-CM | POA: Diagnosis present

## 2020-05-15 LAB — URINALYSIS, ROUTINE W REFLEX MICROSCOPIC
Bacteria, UA: NONE SEEN
Bilirubin Urine: NEGATIVE
Glucose, UA: NEGATIVE mg/dL
Ketones, ur: NEGATIVE mg/dL
Leukocytes,Ua: NEGATIVE
Nitrite: NEGATIVE
Protein, ur: NEGATIVE mg/dL
Specific Gravity, Urine: 1.038 — ABNORMAL HIGH (ref 1.005–1.030)
pH: 7 (ref 5.0–8.0)

## 2020-05-15 LAB — COMPREHENSIVE METABOLIC PANEL
ALT: 26 U/L (ref 0–44)
AST: 25 U/L (ref 15–41)
Albumin: 3.7 g/dL (ref 3.5–5.0)
Alkaline Phosphatase: 137 U/L — ABNORMAL HIGH (ref 38–126)
Anion gap: 9 (ref 5–15)
BUN: 9 mg/dL (ref 6–20)
CO2: 24 mmol/L (ref 22–32)
Calcium: 9.3 mg/dL (ref 8.9–10.3)
Chloride: 107 mmol/L (ref 98–111)
Creatinine, Ser: 0.71 mg/dL (ref 0.44–1.00)
GFR, Estimated: >60 mL/min (ref >60)
Glucose, Bld: 96 mg/dL (ref 70–99)
Potassium: 3.7 mmol/L (ref 3.5–5.1)
Sodium: 140 mmol/L (ref 135–145)
Total Bilirubin: 0.5 mg/dL (ref 0.3–1.2)
Total Protein: 7.4 g/dL (ref 6.5–8.1)

## 2020-05-15 LAB — CBC WITH DIFFERENTIAL/PLATELET
Abs Immature Granulocytes: 0.01 10*3/uL (ref 0.00–0.07)
Basophils Absolute: 0 10*3/uL (ref 0.0–0.1)
Basophils Relative: 1 %
Eosinophils Absolute: 0.1 10*3/uL (ref 0.0–0.5)
Eosinophils Relative: 1 %
HCT: 39.7 % (ref 36.0–46.0)
Hemoglobin: 12.9 g/dL (ref 12.0–15.0)
Immature Granulocytes: 0 %
Lymphocytes Relative: 51 %
Lymphs Abs: 3.5 10*3/uL (ref 0.7–4.0)
MCH: 27.3 pg (ref 26.0–34.0)
MCHC: 32.5 g/dL (ref 30.0–36.0)
MCV: 83.9 fL (ref 80.0–100.0)
Monocytes Absolute: 0.4 10*3/uL (ref 0.1–1.0)
Monocytes Relative: 6 %
Neutro Abs: 2.8 10*3/uL (ref 1.7–7.7)
Neutrophils Relative %: 41 %
Platelets: 244 10*3/uL (ref 150–400)
RBC: 4.73 MIL/uL (ref 3.87–5.11)
RDW: 14.1 % (ref 11.5–15.5)
WBC: 6.8 10*3/uL (ref 4.0–10.5)
nRBC: 0 % (ref 0.0–0.2)

## 2020-05-15 LAB — LIPASE, BLOOD: Lipase: 34 U/L (ref 11–51)

## 2020-05-15 MED ORDER — IOHEXOL 300 MG/ML  SOLN
100.0000 mL | Freq: Once | INTRAMUSCULAR | Status: AC | PRN
  Administered 2020-05-15: 100 mL via INTRAVENOUS

## 2020-05-15 MED ORDER — KETOROLAC TROMETHAMINE 30 MG/ML IJ SOLN
15.0000 mg | Freq: Once | INTRAMUSCULAR | Status: AC
  Administered 2020-05-15: 15 mg via INTRAVENOUS
  Filled 2020-05-15: qty 1

## 2020-05-15 MED ORDER — DICYCLOMINE HCL 20 MG PO TABS: 20.0000 mg | ORAL_TABLET | Freq: Two times a day (BID) | ORAL | 0 refills | Status: DC

## 2020-05-15 MED ORDER — SODIUM CHLORIDE 0.9 % IV BOLUS
1000.0000 mL | Freq: Once | INTRAVENOUS | Status: AC
  Administered 2020-05-15: 1000 mL via INTRAVENOUS

## 2020-05-15 MED ORDER — MORPHINE SULFATE (PF) 4 MG/ML IV SOLN
4.0000 mg | Freq: Once | INTRAVENOUS | Status: AC
  Administered 2020-05-15: 4 mg via INTRAVENOUS
  Filled 2020-05-15: qty 1

## 2020-05-15 MED ORDER — SODIUM CHLORIDE 0.9 % IV BOLUS
500.0000 mL | Freq: Once | INTRAVENOUS | Status: AC
  Administered 2020-05-15: 500 mL via INTRAVENOUS

## 2020-05-15 MED ORDER — ONDANSETRON HCL 4 MG/2ML IJ SOLN
4.0000 mg | Freq: Once | INTRAMUSCULAR | Status: AC
  Administered 2020-05-15: 4 mg via INTRAVENOUS
  Filled 2020-05-15: qty 2

## 2020-05-15 MED ORDER — DICYCLOMINE HCL 10 MG PO CAPS
20.0000 mg | ORAL_CAPSULE | Freq: Once | ORAL | Status: AC
  Administered 2020-05-15: 20 mg via ORAL
  Filled 2020-05-15: qty 2

## 2020-05-15 MED ORDER — PROMETHAZINE HCL 25 MG PO TABS: 25.0000 mg | ORAL_TABLET | Freq: Four times a day (QID) | ORAL | 0 refills | Status: DC | PRN

## 2020-05-15 NOTE — ED Notes (Signed)
C/o pain to right lower abdomen for last 2 months.  Pt had kidney stone Dec 28 th and since passed the stone.  See by PCP this am and told that she has pass the stone but sent to ED because pain continues.

## 2020-05-15 NOTE — Discharge Instructions (Signed)
Your CT scan and lab tests are negative today for the source of your pain.  It is possible you have intestinal colic (spasm of the wall of your intestine) which can be quite painful but will not be seen with bloodwork or CT studies.    Additionally, when you were seen here in October with these same symptoms, Dr Laural Golden sent you here for an ultrasound to assess your gallbladder which was a normal study.  His next plan was to set up an additional test called a HIDA scan to completely eliminate your gallbladder as the source of your pain. Apparently this did not occur.  You need to followup with him so he can complete your test and further evaluate your symptoms.

## 2020-05-15 NOTE — ED Provider Notes (Signed)
[facility] EMERGENCY DEPARTMENT Provider Note   CSN: [id number] Arrival date & time: 05/15/20  1138     History Chief Complaint  Patient presents with  . Abdominal Pain    Bethany Wang is a 55 y.o. female with a history significant for CAD, HTN, GERD including PUD and recent passage of right ureteral stone after being seen here in December with CT imaging, returns with complaint of persistent right lower, right upper and left upper abdominal pain, worsened in the RLQ, despite passage of the urine stone.  Pain is severe and reports constant x 2 months now. Also reports nausea with smell of food or any attempts at PO intake although is able to tolerate liquids.  Her nausea improves if she drinks cola and has been getting most nutrition with Ensure.  Denies weight loss, in fact states she weighed 143 last month, was 163 today in PCP's office. Denies fevers, chills, chest pain, sob, no dysuria, constipation or diarrhea.  Last bm yesterday and normal.  Passing flatus, denies reflux. Surgical hx sig for total hysterectomy. Has taken zofran but not helpful.   Was seen by her PCP this am and directed here for further evaluation.    HPI     Past Medical History:  Diagnosis Date  . Anemia   . Anxiety   . Bilateral breast cysts 12/30/2015  . Brain tumor ([place])   . Breast cancer ([place])    Left Breast Cancer  . Breast disorder   . BV (bacterial vaginosis) 09/09/2015  . Coronary atherosclerosis of native coronary artery    a. s/p DES to RCA in 2013 b. DES to LAD in 2014 c. cath in 04/2016 showing patent LAD stent with D2 jailed by LAD stent and CTO of RCA with left to right collaterals present  . Cyst of pharynx or nasopharynx    [name]  . Decreased libido 12/25/2018  . Depression   . Essential hypertension   . Falls    x 3-4 in past year  . GERD (gastroesophageal reflux disease)   . Headache   . History of hematuria   . Mixed hyperlipidemia   . Personal history of  chemotherapy    Left Breast Cancer  . Personal history of radiation therapy    Left Breast Cancer  . Pre-diabetes   . PUD (peptic ulcer disease)   . Seizures ([place])   . Shingles 09/09/2015  . Stroke ([place])    12-2017 on Plavix, only deficit is headaches  . Suicide attempt ([place])    3 attempts in remote past  . Trichimoniasis 08/20/2019   Treated 08/20/19   . Uterine cancer ([place])   . Vitamin D deficiency disease 04/03/2019    Patient Active Problem List   Diagnosis Date Noted  . Trichimoniasis 08/20/2019  . Screening examination for STD (sexually transmitted disease) 08/15/2019  . Vaginal itching 08/15/2019  . Hypertension 08/15/2019  . Vitamin D deficiency disease 04/03/2019  . Decreased libido 12/25/2018  . Angina pectoris ([place]) 10/25/2018  . Palpitations 06/06/2018  . Chest pain 07/13/2017  . Nonspecific chest pain 07/13/2017  . Mixed hyperlipidemia 07/13/2017  . Precordial chest pain   . Hypertensive urgency 06/15/2016  . Unstable angina pectoris ([place]) 06/15/2016  . Unstable angina ([place]) 06/15/2016  . Bilateral breast cysts 12/30/2015  . Vaginal discharge 09/09/2015  . BV (bacterial vaginosis) 09/09/2015  . Shingles 09/09/2015  . History of breast cancer 09/09/2015  . Syncope and collapse 08/08/2015  . Migraine with  aura and with status migrainosus, not intractable 08/08/2015  . Chronic low back pain 08/08/2015  . Insomnia 08/08/2015  . Anxiety state 08/08/2015  . Panic attacks 08/08/2015  . Anemia, iron deficiency 08/08/2015  . Pica 08/08/2015  . Angina decubitus ([place]) 07/03/2014  . 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 Encounter for gynecological examination with Papanicolaou smear of cervix 02/04/2014  . Syncope 12/14/2013  . Cervical disc disorder with radiculopathy of cervical region 02/19/2013  . H/O rotator cuff surgery 02/19/2013  . Dyspareunia 02/01/2013  . Left shoulder pain 01/24/2013  . Rotator cuff tear 01/24/2013  . Radicular pain 01/24/2013   . Labral tear of shoulder 01/24/2013  . Complex regional pain syndrome of upper extremity 01/24/2013  . Herniated disc, cervical 01/24/2013  . Nausea and vomiting 01/18/2013  . Rectal bleeding 01/18/2013  . Edema of left foot 10/16/2012  . Coronary atherosclerosis of native coronary artery   . Essential hypertension, benign   . HLD (hyperlipidemia)   . Tobacco abuse     Past Surgical History:  Procedure Laterality Date  . ABDOMINAL HYSTERECTOMY    . BIOPSY  11/28/2019   Procedure: BIOPSY;  Surgeon: Rogene Houston, MD;  [facility];  Service: Endoscopy;;  antral, gastric body   . BLADDER SURGERY    . BREAST LUMPECTOMY Left   . BREAST LUMPECTOMY Right 02/24/2018   Procedure: RIGHT BREAST LUMPECTOMY ERAS PATHWAY;  Surgeon: Jovita Kussmaul, MD;  Location: [place];  Service: General;  Laterality: Right;  . CARDIAC CATHETERIZATION N/A 05/10/2016   Procedure: Left Heart Cath and Coronary Angiography;  Surgeon: Belva Crome, MD;  Location: [place] [facility];  Service: Cardiovascular;  Laterality: N/A;  . COLONOSCOPY  May 2010   [place]man: normal rectum, internal hemorrhoids, , benign colonic polyp  . COLONOSCOPY N/A 01/05/2017   Procedure: COLONOSCOPY;  Surgeon: Rogene Houston, MD;  [facility];  Service: Endoscopy;  Laterality: N/A;  1:00  . ESOPHAGEAL DILATION  11/28/2019   Procedure: ESOPHAGEAL DILATION;  Surgeon: Rogene Houston, MD;  [facility];  Service: Endoscopy;;  . ESOPHAGOGASTRODUODENOSCOPY     2001 Dr. Amedeo Plenty: distal esophagitis, small hiatal hernia,. Dr. Tamala Julian 2006? no records available currently, pt also reports  EGD a few years ago with Dr. Gala Romney, do not have these reports anywhere in medical records  . ESOPHAGOGASTRODUODENOSCOPY  06/02/2011   TGY:BWLSLH pill impaction as described above s/p dilation of a probable cervical esophageal web/bx abnormal esophageal and gastric mucosa. + H.pylori gastritis   . ESOPHAGOGASTRODUODENOSCOPY (EGD)  WITH PROPOFOL N/A 11/28/2019   Procedure: ESOPHAGOGASTRODUODENOSCOPY (EGD) WITH PROPOFOL;  Surgeon: Rogene Houston, MD;  [facility];  Service: Endoscopy;  Laterality: N/A;  1020  . EYE SURGERY     Removed glass  . HAND SURGERY Right   . Left breast lumpectomy     Benign  . LEFT HEART CATH AND CORONARY ANGIOGRAPHY N/A 10/25/2018   Procedure: LEFT HEART CATH AND CORONARY ANGIOGRAPHY;  Surgeon: Wellington Hampshire, MD;  Location: [place] [facility];  Service: Cardiovascular;  Laterality: N/A;  . LEFT HEART CATHETERIZATION WITH CORONARY ANGIOGRAM N/A 07/03/2014   Procedure: LEFT HEART CATHETERIZATION WITH CORONARY ANGIOGRAM;  Surgeon: Belva Crome, MD;  Location: [facility] C[facility];  Service: Cardiovascular;  Laterality: N/A;  . POLYPECTOMY  01/05/2017   Procedure: POLYPECTOMY;  Surgeon: Rogene Houston, MD;  [facility];  Service: Endoscopy;;  colon  . SHOULDER SURGERY Left  OB History    Gravida  4   Para  3   Term      Preterm      AB  1   Living  3     SAB  1   IAB      Ectopic      Multiple      Live Births              Family History  Problem Relation Age of Onset  . Colon cancer Paternal Grandfather   . Cancer Father   . Cancer Maternal Uncle   . Alzheimer's disease Paternal Aunt   . Cirrhosis Maternal Uncle   . Cancer Paternal Aunt        lung  . Aneurysm Mother        brain  . Heart disease Brother   . Heart attack Brother   . Mental illness Daughter   . ADD / ADHD Daughter   . Bipolar disorder Daughter   . Mental illness Son   . ADD / ADHD Son   . Bipolar disorder Son   . Mental illness Son   . ADD / ADHD Son   . Bipolar disorder Son     Social History   Tobacco Use  . Smoking status: Former Smoker    Packs/day: 0.20    Years: 32.00    Pack years: 6.40    Types: Cigarettes, Cigars    Start date: 07/05/1983    Quit date: 04/03/2012    Years since quitting: 8.1  . Smokeless tobacco: Never Used  Vaping Use  .  Vaping Use: Never used  Substance Use Topics  . Alcohol use: No    Alcohol/week: 0.0 standard drinks  . Drug use: No    Home Medications Prior to Admission medications   Medication Sig Start Date End Date Taking? Authorizing Provider  dicyclomine (BENTYL) 20 MG tablet Take 1 tablet (20 mg total) by mouth 2 (two) times daily. 05/15/20  Yes Shahzaib Azevedo, Almyra Free, PA-C  promethazine (PHENERGAN) 25 MG tablet Take 1 tablet (25 mg total) by mouth every 6 (six) hours as needed for nausea or vomiting. 05/15/20  Yes Ryanne Morand, Almyra Free, PA-C  ALPRAZolam Duanne Moron) 0.5 MG tablet Take 1 tablet (0.5 mg total) by mouth 2 (two) times daily as needed. 03/19/20   Hurshel Party C, MD  amLODipine (NORVASC) 10 MG tablet TAKE 1 TABLET BY MOUTH DAILY *DOSE INCREASED 11/23/18* 04/28/20   Hurshel Party C, MD  aspirin EC 81 MG tablet Take 2 tablets (162 mg total) by mouth 2 (two) times daily. 12/01/19   Rogene Houston, MD  atorvastatin (LIPITOR) 40 MG tablet Take 1 tablet (40 mg total) by mouth daily at 6 PM. 12/31/19   Satira Sark, MD  azithromycin (ZITHROMAX) 250 MG tablet Take 2 tablets the first day and then 1 tablet every day for the next 4 days 03/19/20   [name], MD  bismuth-metronidazole-tetracycline [facility]) [phone] MG capsule Take 3 capsules by mouth 4 (four) times daily -  before meals and at bedtime. 11/30/19   Rogene Houston, MD  Cholecalciferol (VITAMIN D3) 25 MCG (1000 UT) CHEW Chew 2,000 Units by mouth daily. 11/19/19   [name], MD  ciprofloxacin-dexamethasone (CIPRODEX) OTIC suspension  12/14/19   [provider]  clopidogrel (PLAVIX) 75 MG tablet Take 1 tablet (75 mg total) by mouth daily. 12/31/19   Satira Sark, MD  escitalopram (LEXAPRO) 20 MG tablet TAKE 1 TABLET  BY MOUTH DAILY 04/28/20   [name], MD  fluticasone (FLONASE) 50 MCG/ACT nasal spray Place 1 spray into both nostrils daily. 03/13/19   Kendrick, Caitlyn S, PA-C  Fluticasone-Salmeterol (ADVAIR) 100-50 MCG/DOSE  AEPB INHALE 1 PUFF BY MOUTH TWICE DAILY 04/28/20   Hurshel Party C, MD  ibuprofen (ADVIL) 400 MG tablet Take 1 tablet (400 mg total) by mouth every 6 (six) hours as needed. 04/08/20   Mesner, Corene Cornea, MD  isosorbide mononitrate (IMDUR) 30 MG 24 hr tablet Take 3 tablets (90 mg total) by mouth daily. 12/31/19   Satira Sark, MD  Light Mineral Oil-Mineral Oil (RETAINE MGD OP) Apply 1 application to eye daily.     [provider]  LINZESS 145 MCG CAPS capsule TAKE 1 CAPSULE BY MOUTH DAILY 04/28/20   Ailene Ards, NP  losartan (COZAAR) 100 MG tablet Take 1 tablet (100 mg total) by mouth daily. 12/31/19   Satira Sark, MD  Medical ID Bracelet MISC 1 Units by Does not apply route daily. 03/06/19   [name], MD  metoprolol tartrate (LOPRESSOR) 50 MG tablet Take 1.5 tablets (75 mg total) by mouth 2 (two) times daily. 12/31/19   Satira Sark, MD  nitroGLYCERIN (NITROSTAT) 0.4 MG SL tablet DISSOLVE ONE TABLET UNDER THE TONGUE AS NEEDED FOR CHEST PAIN EVERY 5 MINUTES UP TO 3 TIMES. IF NO RELIEF CALL 911. 04/28/20   Satira Sark, MD  NONFORMULARY OR COMPOUNDED ITEM Inject 5 mg into the muscle every 7 (seven) days. Testosterone cypionate in olive  oil (25 mg/mL).  Dispense 5 mL vial. 06/11/19   [name], Nimish C, MD  ondansetron (ZOFRAN ODT) 4 MG disintegrating tablet 61m ODT q4 hours prn nausea/vomit 04/08/20   Mesner, JCorene Cornea MD  ondansetron (ZOFRAN) 4 MG tablet Take 1 tablet (4 mg total) by mouth every 8 (eight) hours as needed for nausea or vomiting. 04/08/20   Mesner, JCorene Cornea MD  pantoprazole (PROTONIX) 40 MG tablet Take 1 tablet (40 mg total) by mouth 2 (two) times daily before a meal. 11/19/19   Gosrani, Nimish C, MD  potassium chloride 20 MEQ/15ML (10%) SOLN Take 15 mLs (20 mEq total) by mouth daily. 12/31/19   MSatira Sark MD  prednisoLONE acetate (PRED FORTE) 1 % ophthalmic suspension Place 1 drop into both eyes at bedtime.  10/23/19   [provider]   predniSONE (DELTASONE) 20 MG tablet Take 2 tablets (40 mg total) by mouth daily with breakfast. 03/19/20   GHurshel PartyC, MD  tamsulosin (FLOMAX) 0.4 MG CAPS capsule Take 1 capsule (0.4 mg total) by mouth daily. 04/14/20   GAilene Ards NP  Testosterone Propionate (FIRST-TESTOSTERONE MC) 2 % CREA Place 5 mg onto the skin daily. 09/04/19   GDoree Albee MD  thyroid (NP THYROID) 120 MG tablet Take 1 tablet (120 mg total) by mouth daily before breakfast. 11/19/19   GAnastasio Champion Nimish C, MD  triamcinolone (KENALOG) 0.1 % Apply 1 application topically 2 (two) times daily. 03/04/20   GDoree Albee MD    Allergies    Bee venom, Penicillins, and Shellfish allergy  Review of Systems   Review of Systems  Constitutional: Negative for chills and fever.  HENT: Negative for congestion and sore throat.   Eyes: Negative.   Respiratory: Negative for chest tightness and shortness of breath.   Cardiovascular: Negative for chest pain.  Gastrointestinal: Positive for abdominal pain, nausea and vomiting. Negative for abdominal distention and diarrhea.  Genitourinary: Negative.  Negative for dysuria.  Musculoskeletal: Negative for arthralgias, joint swelling and neck pain.  Skin: Negative.  Negative for rash and wound.  Neurological: Negative for dizziness, weakness, light-headedness, numbness and headaches.  Psychiatric/Behavioral: Negative.   All other systems reviewed and are negative.   Physical Exam Updated Vital Signs BP (!) 161/89 (BP Location: Left Arm)   Pulse 60   Temp 98.3 F (36.8 C) (Oral)   Resp 16   Ht _0  (1.626 m)   Wt 73.9 kg   SpO2 97%   BMI 27.98 kg/m   Physical Exam Vitals and nursing note reviewed.  Constitutional:      Appearance: She is well-developed and well-nourished.  HENT:     Head: Normocephalic and atraumatic.  Eyes:     Conjunctiva/sclera: Conjunctivae normal.  Cardiovascular:     Rate and Rhythm: Normal rate and regular rhythm.     Pulses: Intact  distal pulses.     Heart sounds: Normal heart sounds.  Pulmonary:     Effort: Pulmonary effort is normal.     Breath sounds: Normal breath sounds. No wheezing.  Abdominal:     General: Abdomen is protuberant. Bowel sounds are normal.     Palpations: Abdomen is soft.     Tenderness: There is generalized abdominal tenderness and tenderness in the right upper quadrant, right lower quadrant, epigastric area and left upper quadrant.     Comments: Initial guarding on exam, but distractible.  Normal tympany to percussion.  Musculoskeletal:        General: Normal range of motion.     Cervical back: Normal range of motion.  Skin:    General: Skin is warm and dry.  Neurological:     Mental Status: She is alert.  Psychiatric:        Mood and Affect: Mood and affect normal.     ED Results / Procedures / Treatments   Labs (all labs ordered are listed, but only abnormal results are displayed) Labs Reviewed  COMPREHENSIVE METABOLIC PANEL - Abnormal; Notable for the following components:      Result Value   Alkaline Phosphatase 137 (*)    All other components within normal limits  URINALYSIS, ROUTINE W REFLEX MICROSCOPIC - Abnormal; Notable for the following components:   Specific Gravity, Urine 1.038 (*)    Hgb urine dipstick SMALL (*)    All other components within normal limits  LIPASE, BLOOD  CBC WITH DIFFERENTIAL/PLATELET    EKG None  Radiology CT ABDOMEN PELVIS W CONTRAST  Result Date: 05/15/2020 CLINICAL DATA:  Acute nonlocalized abdominal pain. Right-sided pain with nausea, vomiting, and decreased appetite for 2 months. EXAM: CT ABDOMEN AND PELVIS WITH CONTRAST TECHNIQUE: Multidetector CT imaging of the abdomen and pelvis was performed using the standard protocol following bolus administration of intravenous contrast. CONTRAST:  13m OMNIPAQUE IOHEXOL 300 MG/ML  SOLN COMPARISON:  Noncontrast CT 05/06/2020. Contrast-enhanced CT 04/08/2020 FINDINGS: Lower chest: No acute airspace  disease or pleural effusion. There are coronary artery calcifications. Hepatobiliary: Multiple lobulated low-density liver lesions are not significantly changed from prior exam. Largest lesion in the left lobe measures 2.7 cm. There is some fill-in on delayed phase imaging and peripheral puddling of contrast, consistent with hemangiomas. These are unchanged from prior exam. No new hepatic lesion. Gallbladder physiologically distended, no calcified stone. No biliary dilatation. Pancreas: No ductal dilatation or inflammation. No evidence of pancreatic mass. Spleen: Normal in size without focal abnormality. Adrenals/Urinary Tract: Normal adrenal glands. No hydronephrosis. No perinephric edema. Homogeneous  renal enhancement with symmetric excretion on delayed phase imaging. No evidence of renal calculi. A right retroperitoneal calcification is felt to be a phlebolith. Unremarkable urinary bladder. No bladder wall thickening. Stomach/Bowel: Lack of enteric contrast limits detailed bowel assessment. Stomach is decompressed. No small bowel obstruction or evidence of wall thickening. No small bowel inflammation. The appendix is normal, for example series 5, image 47. There is no terminal ileal inflammation. Small to moderate colonic stool burden. Sigmoid colon is redundant coursing into the central abdomen. No colonic wall thickening or pericolonic edema. Vascular/Lymphatic: Moderate aorto bi-iliac atherosclerosis. No aortic aneurysm or acute vascular findings. Patent portal vein. No enlarged lymph nodes in the abdomen or pelvis. Reproductive: Status post hysterectomy. No adnexal masses. Other: No free air or free fluid. No intra-abdominal fluid collection. No body wall hernia. Musculoskeletal: There are no acute or suspicious osseous abnormalities. IMPRESSION: 1. No acute abnormality in the abdomen/pelvis. 2. Multiple hepatic hemangiomas, unchanged from prior exam. Aortic Atherosclerosis (ICD10-I70.0). Electronically  Signed   By: Keith Rake M.D.   On: 05/15/2020 15:36    Procedures Procedures   Medications Ordered in ED Medications  sodium chloride 0.9 % bolus 500 mL (0 mLs Intravenous Stopped 05/15/20 1357)  ondansetron (ZOFRAN) injection 4 mg (4 mg Intravenous Given 05/15/20 1245)  morphine 4 MG/ML injection 4 mg (4 mg Intravenous Given 05/15/20 1250)  morphine 4 MG/ML injection 4 mg (4 mg Intravenous Given 05/15/20 1416)  iohexol (OMNIPAQUE) 300 MG/ML solution 100 mL (100 mLs Intravenous Contrast Given 05/15/20 1523)  sodium chloride 0.9 % bolus 1,000 mL (0 mLs Intravenous Stopped 05/15/20 1753)  ketorolac (TORADOL) 30 MG/ML injection 15 mg (15 mg Intravenous Given 05/15/20 1638)  dicyclomine (BENTYL) capsule 20 mg (20 mg Oral Given 05/15/20 1637)    ED Course  I have reviewed the triage vital signs and the nursing notes.  Pertinent labs & imaging results that were available during my care of the patient were reviewed by me and considered in my medical decision making (see chart for details).    MDM Rules/Calculators/A&P                          Labs and imaging reviewed and discussed with pt.  Still with 8/10 pain despite morphine IV x 2.  Switched to bentyl and toradol for tx of pain.  Review of chart indicates was seen here 10/21, sent by Dr Laural Golden for [place] given slight bump in LFT's and Alk phos at that time.  [place] was negative. Plan was for outpatient Hida scan. Pt advises she had not followed back up with Dr. Laural Golden since that time. Strongly encouraged to contact him for further management and so he can get outpt Hida scan ordered if still felt appropriate.  Pt had no emesis here after giving phenergan and was able to tolerate Po intake.  Prescribed phenergan for use in place of zofran.  Bentyl for pain relief.  Advised to contact Dr Laural Golden for further management per above.  No acute abd findings.   Final Clinical Impression(s) / ED Diagnoses Final diagnoses:  Generalized abdominal pain    Rx / DC  Orders ED Discharge Orders         Ordered    promethazine (PHENERGAN) 25 MG tablet  Every 6 hours PRN        05/15/20 1847    dicyclomine (BENTYL) 20 MG tablet  2 times daily        05/15/20  [id number]           Evalee Jefferson, Hershal Coria 05/15/20 1920    Hayden Rasmussen, MD 05/15/20 2049

## 2020-05-15 NOTE — ED Triage Notes (Signed)
Abdominal pain onset 2 months ago

## 2020-05-15 NOTE — ED Notes (Signed)
Not able to void at this time.  

## 2020-05-16 ENCOUNTER — Other Ambulatory Visit: Payer: Self-pay | Admitting: General Surgery

## 2020-05-16 DIAGNOSIS — N6002 Solitary cyst of left breast: Secondary | ICD-10-CM

## 2020-05-20 ENCOUNTER — Other Ambulatory Visit (INDEPENDENT_AMBULATORY_CARE_PROVIDER_SITE_OTHER): Payer: Self-pay

## 2020-05-20 ENCOUNTER — Ambulatory Visit (INDEPENDENT_AMBULATORY_CARE_PROVIDER_SITE_OTHER): Payer: 59 | Admitting: Internal Medicine

## 2020-05-20 ENCOUNTER — Other Ambulatory Visit: Payer: Self-pay

## 2020-05-20 ENCOUNTER — Encounter (INDEPENDENT_AMBULATORY_CARE_PROVIDER_SITE_OTHER): Payer: Self-pay | Admitting: Internal Medicine

## 2020-05-20 VITALS — BP 169/91 | HR 83 | Temp 99.1°F | Ht 64.0 in | Wt 161.9 lb

## 2020-05-20 DIAGNOSIS — G8929 Other chronic pain: Secondary | ICD-10-CM

## 2020-05-20 DIAGNOSIS — R109 Unspecified abdominal pain: Secondary | ICD-10-CM | POA: Insufficient documentation

## 2020-05-20 DIAGNOSIS — R634 Abnormal weight loss: Secondary | ICD-10-CM | POA: Diagnosis not present

## 2020-05-20 DIAGNOSIS — R1031 Right lower quadrant pain: Secondary | ICD-10-CM

## 2020-05-20 DIAGNOSIS — R1010 Upper abdominal pain, unspecified: Secondary | ICD-10-CM | POA: Insufficient documentation

## 2020-05-20 DIAGNOSIS — R112 Nausea with vomiting, unspecified: Secondary | ICD-10-CM

## 2020-05-20 DIAGNOSIS — R1013 Epigastric pain: Secondary | ICD-10-CM | POA: Insufficient documentation

## 2020-05-20 MED ORDER — TRAMADOL HCL 50 MG PO TABS: 50.0000 mg | ORAL_TABLET | Freq: Two times a day (BID) | ORAL | 0 refills | Status: DC | PRN

## 2020-05-20 NOTE — Patient Instructions (Signed)
Physician will call with results of blood work HIDA scan and CT when completed.

## 2020-05-20 NOTE — Progress Notes (Signed)
Presenting complaint;  Nausea vomiting and abdominal pain.  History of present illness  Patient is 55 year old African American female who presents for reevaluation of abdominal pain nausea and vomiting. Patient was initially evaluated back in July 2021 for nausea vomiting and dysphagia.  She was also complaining of generalized burning abdominal pain as well as postprandial epigastric pain. She underwent esophagogastroduodenoscopy on 11/28/2019 revealing esophageal web which was disrupted by passing 61 [place] Maloney dilator and she also had gastritis and biopsy revealed H. pylori gastritis and she was treated with Pylera and she states she finished the prescription.  She did not feel much better.  She had abdominal ultrasound on 01/23/2020 revealing hemangiomas and no evidence of cholelithiasis. Patient states she has had the symptoms for over 6 months but her pain has gotten worse over the last 2 months and is described to be constant pain located in right mid abdomen radiating posteriorly.  In addition to constant pain she has sharp severe pain occurring intermittently.  She does not have any other associated symptoms with sharp pain.  What she has noted is that when she rocks her body back-and-forth it helps ease the pain.  She has been seen in emergency room on at least 2 occasions and was told she had IBS.  She was given dicyclomine which did not help and she does not believe that she has IBS.  On her first visit to emergency room on 04/08/2020 she was found to have a small stone in left ureter.  She was seen by urologist and follow-up studies revealed that she had passed the stone spontaneously.  She was seen in the emergency room again last week and had another abdominal pelvic CT and no abnormality was noted to account for her symptoms.  Both of these studies revealed hepatic hemangiomas felt to be stable.  She was given ibuprofen prescription during her ER visit which she has been using when pain  is severe. She also complains of nausea and vomiting.  She usually eats oatmeal at breakfast and 15 minutes later she swallows her pills.  She says she takes 7 pills and potassium at the same time.  And she vomits about 15 minutes later.  She says vomitus consists of food and pills.  She is concerned that she is losing her medications.  She has not experienced hematemesis melena or rectal bleeding.  She says her bowels move 2-3 times a day.  She passes formed stool.  She says she does not have any food until suppertime when she is able to eat lettuce and able to keep it down.  She states she weighed 187 pounds 6 months ago and dropped down to 143 pounds 2 months ago and now she weighs 161 pounds.  She says she does not understand why she has gained weight while she is eating so little.  Heartburn is well controlled with medication.    Current Medications: Outpatient Encounter Medications as of 05/20/2020  Medication Sig  . ALPRAZolam (XANAX) 0.5 MG tablet Take 1 tablet (0.5 mg total) by mouth 2 (two) times daily as needed.  [name] amLODipine (NORVASC) 10 MG tablet TAKE 1 TABLET BY MOUTH DAILY *DOSE INCREASED 11/23/18*  . aspirin EC 81 MG tablet Take 2 tablets (162 mg total) by mouth 2 (two) times daily.  [name] atorvastatin (LIPITOR) 40 MG tablet Take 1 tablet (40 mg total) by mouth daily at 6 PM.  . Cholecalciferol (VITAMIN D3) 25 MCG (1000 UT) CHEW Chew 2,000 Units by mouth daily.  [name]  clopidogrel (PLAVIX) 75 MG tablet Take 1 tablet (75 mg total) by mouth daily.  [name] escitalopram (LEXAPRO) 20 MG tablet TAKE 1 TABLET BY MOUTH DAILY  . fluticasone (FLONASE) 50 MCG/ACT nasal spray Place 1 spray into both nostrils daily.  . Fluticasone-Salmeterol (ADVAIR) 100-50 MCG/DOSE AEPB INHALE 1 PUFF BY MOUTH TWICE DAILY  . ibuprofen (ADVIL) 400 MG tablet Take 1 tablet (400 mg total) by mouth every 6 (six) hours as needed.  . isosorbide mononitrate (IMDUR) 30 MG 24 hr tablet Take 3 tablets (90 mg total) by mouth daily.  Sunday Corn Mineral Oil-Mineral Oil (RETAINE MGD OP) Apply 1 application to eye daily. Right Eye  . LINZESS 145 MCG CAPS capsule TAKE 1 CAPSULE BY MOUTH DAILY  . losartan (COZAAR) 100 MG tablet Take 1 tablet (100 mg total) by mouth daily.  . Medical ID Bracelet MISC 1 Units by Does not apply route daily.  . metoprolol tartrate (LOPRESSOR) 50 MG tablet Take 1.5 tablets (75 mg total) by mouth 2 (two) times daily.  . nitroGLYCERIN (NITROSTAT) 0.4 MG SL tablet DISSOLVE ONE TABLET UNDER THE TONGUE AS NEEDED FOR CHEST PAIN EVERY 5 MINUTES UP TO 3 TIMES. IF NO RELIEF CALL 911.  . pantoprazole (PROTONIX) 40 MG tablet Take 1 tablet (40 mg total) by mouth 2 (two) times daily before a meal.  . potassium chloride 20 MEQ/15ML (10%) SOLN Take 15 mLs (20 mEq total) by mouth daily.  . prednisoLONE acetate (PRED FORTE) 1 % ophthalmic suspension Place 1 drop into both eyes at bedtime.   . tamsulosin (FLOMAX) 0.4 MG CAPS capsule Take 1 capsule (0.4 mg total) by mouth daily.  [name] thyroid (NP THYROID) 120 MG tablet Take 1 tablet (120 mg total) by mouth daily before breakfast.  . triamcinolone (KENALOG) 0.1 % Apply 1 application topically 2 (two) times daily.  [name] dicyclomine (BENTYL) 20 MG tablet Take 1 tablet (20 mg total) by mouth 2 (two) times daily. (Patient not taking: Reported on 05/20/2020)  . [DISCONTINUED] azithromycin (ZITHROMAX) 250 MG tablet Take 2 tablets the first day and then 1 tablet every day for the next 4 days (Patient not taking: Reported on 05/20/2020)  . [DISCONTINUED] bismuth-metronidazole-tetracycline (PYLERA) 140-125-125 MG capsule Take 3 capsules by mouth 4 (four) times daily -  before meals and at bedtime. (Patient not taking: Reported on 05/20/2020)  . [DISCONTINUED] ciprofloxacin-dexamethasone (CIPRODEX) OTIC suspension  (Patient not taking: Reported on 05/20/2020)  . [DISCONTINUED] NONFORMULARY OR COMPOUNDED ITEM Inject 5 mg into the muscle every 7 (seven) days. Testosterone cypionate in olive  oil (25  mg/mL).  Dispense 5 mL vial. (Patient not taking: Reported on 05/20/2020)  . [DISCONTINUED] ondansetron (ZOFRAN ODT) 4 MG disintegrating tablet 8m ODT q4 hours prn nausea/vomit (Patient not taking: Reported on 05/20/2020)  . [DISCONTINUED] ondansetron (ZOFRAN) 4 MG tablet Take 1 tablet (4 mg total) by mouth every 8 (eight) hours as needed for nausea or vomiting. (Patient not taking: Reported on 05/20/2020)  . [DISCONTINUED] predniSONE (DELTASONE) 20 MG tablet Take 2 tablets (40 mg total) by mouth daily with breakfast. (Patient not taking: Reported on 05/20/2020)  . [DISCONTINUED] promethazine (PHENERGAN) 25 MG tablet Take 1 tablet (25 mg total) by mouth every 6 (six) hours as needed for nausea or vomiting. (Patient not taking: Reported on 05/20/2020)  . [DISCONTINUED] Testosterone Propionate (FIRST-TESTOSTERONE MC) 2 % CREA Place 5 mg onto the skin daily. (Patient not taking: Reported on 05/20/2020)   Facility-Administered Encounter Medications as of 05/20/2020  Medication  . NONFORMULARY  OR COMPOUNDED ITEM 0.2 mL   Past Medical History:  Diagnosis Date  . Anemia   . Anxiety   . Bilateral breast cysts 12/30/2015  . Brain tumor ([place])   . Breast cancer ([place])    Left Breast Cancer  . Breast disorder   . BV (bacterial vaginosis) 09/09/2015  . Coronary atherosclerosis of native coronary artery    a. s/p DES to RCA in 2013 b. DES to LAD in 2014 c. cath in 04/2016 showing patent LAD stent with D2 jailed by LAD stent and CTO of RCA with left to right collaterals present  . Cyst of pharynx or nasopharynx    [name]  . Decreased libido 12/25/2018  . Depression   . Essential hypertension   . Falls    x 3-4 in past year  . GERD (gastroesophageal reflux disease)   . Headache   . History of hematuria   . Mixed hyperlipidemia   . Personal history of chemotherapy    Left Breast Cancer  . Personal history of radiation therapy    Left Breast Cancer  . Pre-diabetes   . PUD (peptic ulcer  disease)   . Seizures ([place])   . Shingles 09/09/2015  . Stroke ([place])    12-2017 on Plavix, only deficit is headaches  . Suicide attempt ([place])    3 attempts in remote past  . Trichimoniasis 08/20/2019   Treated 08/20/19   . Uterine cancer ([place]) diagnosed at age 41.  She says following surgery she had chemo and radiation therapy.   . Vitamin D deficiency disease 04/03/2019   Past Surgical History:  Procedure Laterality Date  . ABDOMINAL HYSTERECTOMY    . BIOPSY  11/28/2019   Procedure: BIOPSY;  Surgeon: Rogene Houston, MD;  [facility];  Service: Endoscopy;;  antral, gastric body   . BLADDER SURGERY    . BREAST LUMPECTOMY Left   . BREAST LUMPECTOMY Right 02/24/2018   Procedure: RIGHT BREAST LUMPECTOMY ERAS PATHWAY;  Surgeon: Jovita Kussmaul, MD;  Location: [facility];  Service: General;  Laterality: Right;  . CARDIAC CATHETERIZATION N/A 05/10/2016   Procedure: Left Heart Cath and Coronary Angiography;  Surgeon: Belva Crome, MD;  Location: [facility];  Service: Cardiovascular;  Laterality: N/A;  . COLONOSCOPY  May 2010   Fleishman: normal rectum, internal hemorrhoids, , benign colonic polyp  . COLONOSCOPY N/A 01/05/2017   Procedure: COLONOSCOPY;  Surgeon: Rogene Houston, MD;  [facility];  Service: Endoscopy;  Laterality: N/A;  1:00  . ESOPHAGEAL DILATION  11/28/2019   Procedure: ESOPHAGEAL DILATION;  Surgeon: Rogene Houston, MD;  [facility];  Service: Endoscopy;;  . ESOPHAGOGASTRODUODENOSCOPY     2001 Dr. Amedeo Plenty: distal esophagitis, small hiatal hernia,. Dr. Tamala Julian 2006? no records available currently, pt also reports  EGD a few years ago with Dr. Gala Romney, do not have these reports anywhere in medical records  . ESOPHAGOGASTRODUODENOSCOPY  06/02/2011   NFA:OZHYQM pill impaction as described above s/p dilation of a probable cervical esophageal web/bx abnormal esophageal and gastric mucosa. + H.pylori gastritis   . ESOPHAGOGASTRODUODENOSCOPY (EGD) WITH  PROPOFOL N/A 11/28/2019   Procedure: ESOPHAGOGASTRODUODENOSCOPY (EGD) WITH PROPOFOL;  Surgeon: Rogene Houston, MD;  [facility];  Service: Endoscopy;  Laterality: N/A;  1020  . EYE SURGERY     Removed glass  . HAND SURGERY Right   . Left breast lumpectomy     Benign  . LEFT HEART CATH  AND CORONARY ANGIOGRAPHY N/A 10/25/2018   Procedure: LEFT HEART CATH AND CORONARY ANGIOGRAPHY;  Surgeon: Wellington Hampshire, MD;  Location: [facility];  Service: Cardiovascular;  Laterality: N/A;  . LEFT HEART CATHETERIZATION WITH CORONARY ANGIOGRAM N/A 07/03/2014   Procedure: LEFT HEART CATHETERIZATION WITH CORONARY ANGIOGRAM;  Surgeon: Belva Crome, MD;  Location: [facility];  Service: Cardiovascular;  Laterality: N/A;  . POLYPECTOMY  01/05/2017   Procedure: POLYPECTOMY;  Surgeon: Rogene Houston, MD;  [facility];  Service: Endoscopy;;  colon  . SHOULDER SURGERY Left    Allergies  Allergen Reactions  . Bee Venom Anaphylaxis and Other (See Comments)    "throat swelled and had to the hospital" - Yellow jacket sting  . Penicillins Anaphylaxis and Other (See Comments)    Has patient had a PCN reaction causing immediate rash, facial/tongue/throat swelling, SOB or lightheadedness with hypotension: Yes Has patient had a PCN reaction causing severe rash involving mucus membranes or skin necrosis: Yes Has patient had a PCN reaction that required hospitalization Yes Has patient had a PCN reaction occurring within the last 10 years: Yes If all of the above answers are "NO", then may proceed with Cephalosporin use. Patient states she carries an epi-pen  . Shellfish Allergy Swelling  . Zofran [Ondansetron] Itching   Family history  Father was diagnosed with lung carcinoma at age 18 and died at age 75.  Mother had brain aneurysm and Covid and died in 25-Jun-2018. She has 4 sisters and 3 of her sisters have had her gallbladder removed.  Her mother also had gallbladder surgery.  She has 9  brothers and she states they are all alcoholics.  Social history.  She is married.  She has 2 sons and 1 daughter.  Her son age 51 is diabetic.  Her younger son age 24 also drinks too much alcohol.  Her daughter age 18 is in good health.  She worked at [employer] and [employer] other [employer] for total of 25 years.  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  She smokes cigarettes for 25 years about a pack a day but quit 7 years ago when she was diagnosed with coronary artery disease.  She does not drink alcohol.    Physical examination  Blood pressure (!) 169/91, pulse 83, temperature 99.1 F (37.3 C), temperature source Oral, height _0  (1.626 m), weight 161 lb 14.4 oz (73.4 kg). Patient is alert and in no acute distress. She is wearing a mask. Conjunctiva is pink. Sclera is nonicteric Oropharyngeal mucosa is normal. No neck masses or thyromegaly noted. Cardiac exam with regular rhythm normal S1 and S2. No murmur or gallop noted. Lungs are clear to auscultation. Abdomen is symmetrical.  Bowel sounds are normal.  No bruits noted.  On palpation abdomen is soft.  She has tenderness in epigastrium as well as in left and right mid abdomen.  She is most tender in the right mid abdomen where she guards.  No rebound.  No organomegaly or masses. No LE edema or clubbing noted.  Labs/studies Results:  CBC Latest Ref Rng & Units 05/15/2020 04/07/2020 01/23/2020  WBC 4.0 - 10.5 K/uL 6.8 7.7 8.8  Hemoglobin 12.0 - 15.0 g/dL 12.9 13.0 13.4  Hematocrit 36.0 - 46.0 % 39.7 40.3 42.0  Platelets 150 - 400 K/uL 244 264 263    CMP Latest Ref Rng & Units 05/15/2020 04/07/2020 01/23/2020  Glucose 70 - 99 mg/dL  96 94 131(H)  BUN 6 - 20 mg/dL _0 Creatinine 0.44 - 1.00 mg/dL 0.71 0.76 0.74  Sodium 135 - 145 mmol/L 140 134(L) 138  Potassium 3.5 - 5.1 mmol/L 3.7 3.7 3.4(L)  Chloride 98 - 111 mmol/L 107 102 102  CO2 22 - 32 mmol/L _1 Calcium 8.9 - 10.3 mg/dL  9.3 9.2 9.1  Total Protein 6.5 - 8.1 g/dL 7.4 7.4 7.2  Total Bilirubin 0.3 - 1.2 mg/dL 0.5 0.6 0.6  Alkaline Phos 38 - 126 U/L 137(H) 120 132(H)  AST 15 - 41 U/L 25 20 37  ALT 0 - 44 U/L 26 21 49(H)    Hepatic Function Latest Ref Rng & Units 05/15/2020 04/07/2020 01/23/2020  Total Protein 6.5 - 8.1 g/dL 7.4 7.4 7.2  Albumin 3.5 - 5.0 g/dL 3.7 4.0 3.9  AST 15 - 41 U/L 25 20 37  ALT 0 - 44 U/L 26 21 49(H)  Alk Phosphatase 38 - 126 U/L 137(H) 120 132(H)  Total Bilirubin 0.3 - 1.2 mg/dL 0.5 0.6 0.6  Bilirubin, Direct 0.0 - 0.3 mg/dL - - -    Abdominal pelvic CT images from 04/08/2020 and 05/15/2020 reviewed. Multiple hepatic hemangiomas.  No evidence of cholelithiasis pancreatic abnormality stomach or bowel wall thickening.  Assessment:  #1.  Chronic nausea and vomiting.  Patient has remote history of peptic ulcer disease.  She underwent esophagogastroduodenoscopy and August 2021 when she was found to have H. pylori gastritis and treated with Pylera.  However there was no symptomatic improvement.  No evidence of cholelithiasis on ultrasound.  It remains to be seen if she has developed ulcer since her last exam.  Her risk include history of peptic ulcer disease and she is on 4 baby aspirins along with clopidogrel and she is also been using ibuprofen on as-needed basis. She is on double dose PPI which will have some protective value. She could also have gallbladder disease despite not having gallstones.  #2.  Abdominal pain.  Most of her pain is located right mid abdomen.  Lab studies been unremarkable.  To CT is within the last 6 weeks of failed to reveal any abnormality that would explain her pain.  I think this pain may be referred pain from her back.  Given history of stroke and coronary artery disease need to rule out abdominal angina before attention directed to her back.  #3.  Weight loss.  She has had net weight loss of 25 pounds in the last 6 months or so.  I wonder if she is hypothyroid  since she is vomiting her pills every day.   Plan:  Discontinue ibuprofen.  Patient advised not to take OTC NSAIDs other than aspirin that she is on. Patient will go to the lab for sed rate and TSH. Proceed with HIDA scan with CCK. CTA abdomen and pelvis. Tramadol 50 mg p.o. twice daily as needed for severe pain.  Prescription given for 20 doses without refill. Office visit date to be determined once the studies are completed.

## 2020-05-21 LAB — TSH: TSH: 4.24 mIU/L

## 2020-05-21 LAB — SEDIMENTATION RATE: Sed Rate: 75 mm/h — ABNORMAL HIGH (ref 0–30)

## 2020-05-23 ENCOUNTER — Other Ambulatory Visit (INDEPENDENT_AMBULATORY_CARE_PROVIDER_SITE_OTHER): Payer: Self-pay | Admitting: *Deleted

## 2020-05-23 ENCOUNTER — Ambulatory Visit
Admission: RE | Admit: 2020-05-23 | Discharge: 2020-05-23 | Disposition: A | Discharge status: Home / Self Care | Payer: 59 | Source: Ambulatory Visit | Attending: General Surgery | Admitting: General Surgery

## 2020-05-23 ENCOUNTER — Other Ambulatory Visit: Payer: Self-pay

## 2020-05-23 DIAGNOSIS — R112 Nausea with vomiting, unspecified: Secondary | ICD-10-CM

## 2020-05-23 DIAGNOSIS — R634 Abnormal weight loss: Secondary | ICD-10-CM

## 2020-05-23 DIAGNOSIS — N6002 Solitary cyst of left breast: Secondary | ICD-10-CM

## 2020-05-23 DIAGNOSIS — R1031 Right lower quadrant pain: Secondary | ICD-10-CM

## 2020-05-23 DIAGNOSIS — G8929 Other chronic pain: Secondary | ICD-10-CM

## 2020-05-28 ENCOUNTER — Encounter (HOSPITAL_COMMUNITY)
Admission: RE | Admit: 2020-05-28 | Discharge: 2020-05-28 | Disposition: A | Discharge status: Home / Self Care | Payer: 59 | Source: Ambulatory Visit | Attending: Internal Medicine | Admitting: Internal Medicine

## 2020-05-28 ENCOUNTER — Encounter (HOSPITAL_COMMUNITY): Payer: Self-pay

## 2020-05-28 ENCOUNTER — Other Ambulatory Visit: Payer: 59

## 2020-05-28 ENCOUNTER — Other Ambulatory Visit: Payer: Self-pay

## 2020-05-28 DIAGNOSIS — R1031 Right lower quadrant pain: Secondary | ICD-10-CM | POA: Insufficient documentation

## 2020-05-28 DIAGNOSIS — G8929 Other chronic pain: Secondary | ICD-10-CM | POA: Diagnosis present

## 2020-05-28 DIAGNOSIS — R112 Nausea with vomiting, unspecified: Secondary | ICD-10-CM | POA: Insufficient documentation

## 2020-05-28 HISTORY — DX: Type 2 diabetes mellitus without complications: E11.9

## 2020-05-28 MED ORDER — TECHNETIUM TC 99M MEBROFENIN IV KIT
5.0000 | PACK | Freq: Once | INTRAVENOUS | Status: AC | PRN
  Administered 2020-05-28: 5 via INTRAVENOUS

## 2020-05-29 ENCOUNTER — Other Ambulatory Visit (INDEPENDENT_AMBULATORY_CARE_PROVIDER_SITE_OTHER): Payer: Self-pay | Admitting: *Deleted

## 2020-05-29 DIAGNOSIS — R112 Nausea with vomiting, unspecified: Secondary | ICD-10-CM

## 2020-05-29 DIAGNOSIS — G8929 Other chronic pain: Secondary | ICD-10-CM

## 2020-05-29 DIAGNOSIS — R634 Abnormal weight loss: Secondary | ICD-10-CM

## 2020-06-02 ENCOUNTER — Telehealth (INDEPENDENT_AMBULATORY_CARE_PROVIDER_SITE_OTHER): Payer: Self-pay

## 2020-06-02 ENCOUNTER — Telehealth (INDEPENDENT_AMBULATORY_CARE_PROVIDER_SITE_OTHER): Payer: Self-pay | Admitting: Internal Medicine

## 2020-06-02 NOTE — Telephone Encounter (Signed)
Which medications that she talking about?

## 2020-06-02 NOTE — Telephone Encounter (Signed)
Patient called the office regarding test results  -  Please advise - ph# [phone]

## 2020-06-02 NOTE — Telephone Encounter (Signed)
I have called the patient . The phone rang several time and then the call disconnected. Either myself or Crystal will call the patient back on 06/03/20, to give result and recommendation from [facility].. Also per Dr.Rehman he has called the patient twice with no answer.

## 2020-06-04 NOTE — Telephone Encounter (Signed)
The GI procedure she done few days ago. Pt stated she was told stop all medicines for procedure. Pt is asking should she wait   until GI doctor starts her back? But she asked should she go back on everything? Or wait til Doctor. At GI who done the testing wants to tell her what to take?

## 2020-06-05 ENCOUNTER — Telehealth (INDEPENDENT_AMBULATORY_CARE_PROVIDER_SITE_OTHER): Payer: Self-pay | Admitting: Internal Medicine

## 2020-06-05 NOTE — Telephone Encounter (Signed)
Once she has had all the procedures, she can go back on all her medications. Follow-up with Dr. Laural Golden as recommended.

## 2020-06-05 NOTE — Telephone Encounter (Signed)
I was finally able to reach patient today. She states she is busy and usually does not answer phone calls and particularly if she is not familiar with the number Bethany Wang scan revealed high EF.  She remains with postprandial pain.  She has undergone multiple studies including ultrasound and CT EGD. I would recommend surgical consultation as her symptoms are suspicious of gallbladder disease.  I also reviewed letter from [place] [facility] regarding the retrospective intervention program. Patient does not have bronchial asthma.  She states her grandchildren have it but not her. She was given Flovent when she was having sinus problems. He is using tramadol only when pain is pronounced otherwise she will have to go to ER.  Will request surgical consultation with Dr. Arnoldo Morale or Dr. Constance Haw.

## 2020-06-06 NOTE — Telephone Encounter (Signed)
Referral placed in epic.

## 2020-06-09 ENCOUNTER — Encounter (INDEPENDENT_AMBULATORY_CARE_PROVIDER_SITE_OTHER): Payer: Self-pay

## 2020-06-09 ENCOUNTER — Ambulatory Visit (INDEPENDENT_AMBULATORY_CARE_PROVIDER_SITE_OTHER): Payer: Medicare Other | Admitting: Internal Medicine

## 2020-06-09 NOTE — Telephone Encounter (Signed)
I believe chain of messages meant for Dr. Laural Golden

## 2020-06-11 ENCOUNTER — Telehealth (INDEPENDENT_AMBULATORY_CARE_PROVIDER_SITE_OTHER): Payer: 59 | Admitting: Nurse Practitioner

## 2020-06-11 DIAGNOSIS — R059 Cough, unspecified: Secondary | ICD-10-CM | POA: Diagnosis not present

## 2020-06-11 MED ORDER — ALBUTEROL SULFATE HFA 108 (90 BASE) MCG/ACT IN AERS: 2.0000 | INHALATION_SPRAY | Freq: Four times a day (QID) | RESPIRATORY_TRACT | 0 refills | Status: DC | PRN

## 2020-06-11 MED ORDER — BENZONATATE 100 MG PO CAPS: 100.0000 mg | ORAL_CAPSULE | Freq: Two times a day (BID) | ORAL | 0 refills | Status: DC | PRN

## 2020-06-11 MED ORDER — PREDNISONE 20 MG PO TABS: 40.0000 mg | ORAL_TABLET | Freq: Every day | ORAL | 0 refills | Status: DC

## 2020-06-11 MED ORDER — AZITHROMYCIN 250 MG PO TABS: ORAL_TABLET | ORAL | 0 refills | Status: DC

## 2020-06-11 NOTE — Telephone Encounter (Signed)
I called and left a message on the patient's voice mail asking that she call our office back.

## 2020-06-11 NOTE — Telephone Encounter (Signed)
Which you please call patient and find out which medication she has question about.

## 2020-06-11 NOTE — Progress Notes (Signed)
An audio-only tele-health visit was conducted today. I connected with  Bethany Wang on 06/11/20 utilizing audio-only technology and verified that I am speaking with the correct person using two identifiers. The patient was located at their home, and I was located at the office of [facility] during the encounter. I discussed the limitations of evaluation and management by telemedicine. The patient expressed understanding and agreed to proceed.   Subjective:  Patient ID: Bethany Wang, female    DOB: [birth year]  Age: 55 y.o. MRN: [id number]  CC:  Chief Complaint  Patient presents with  . Cough      HPI  This patient arrives today for virtual visit for the above.  Approximately 4 days ago she started experiencing sinus congestion which has now progressed to sinus congestion and cough.  She denies any fever, she tells me she was tested for Covid and this came back negative on Saturday.  She tells me she had similar symptoms before and improved with the use of antibiotics, cough suppressant, and prednisone.  She does experience some intermittent shortness of breath and wheezing.  Past Medical History:  Diagnosis Date  . Anemia   . Anxiety   . Bilateral breast cysts 12/30/2015  . Brain tumor ([place])   . Breast cancer ([place])    Left Breast Cancer  . Breast disorder   . BV (bacterial vaginosis) 09/09/2015  . Coronary atherosclerosis of native coronary artery    a. s/p DES to RCA in 2013 b. DES to LAD in 2014 c. cath in 04/2016 showing patent LAD stent with D2 jailed by LAD stent and CTO of RCA with left to right collaterals present  . Cyst of pharynx or nasopharynx    [name]  . Decreased libido 12/25/2018  . Depression   . Diabetes mellitus without complication ([place])   . Essential hypertension   . Falls    x 3-4 in past year  . GERD (gastroesophageal reflux disease)   . Headache   . History of hematuria   . Mixed hyperlipidemia   . Personal  history of chemotherapy    Left Breast Cancer  . Personal history of radiation therapy    Left Breast Cancer  . Pre-diabetes   . PUD (peptic ulcer disease)   . Seizures ([place])   . Shingles 09/09/2015  . Stroke ([place])    12-2017 on Plavix, only deficit is headaches  . Suicide attempt ([place])    3 attempts in remote past  . Trichimoniasis 08/20/2019   Treated 08/20/19   . Uterine cancer ([place])   . Vitamin D deficiency disease 04/03/2019      Family History  Problem Relation Age of Onset  . Colon cancer Paternal Grandfather   . Cancer Father   . Cancer Maternal Uncle   . Alzheimer's disease Paternal Aunt   . Cirrhosis Maternal Uncle   . Cancer Paternal Aunt        lung  . Aneurysm Mother        brain  . Heart disease Brother   . Heart attack Brother   . Mental illness Daughter   . ADD / ADHD Daughter   . Bipolar disorder Daughter   . Mental illness Son   . ADD / ADHD Son   . Bipolar disorder Son   . Mental illness Son   . ADD / ADHD Son   . Bipolar disorder Son     Social History   Social History  Narrative   Patient lives at home with her husband but in the process of being separated now.On disability since age 45 yrs.   Social History   Tobacco Use  . Smoking status: Former Smoker    Packs/day: 0.20    Years: 32.00    Pack years: 6.40    Types: Cigarettes, Cigars    Start date: 07/05/1983    Quit date: 04/03/2012    Years since quitting: 8.1  . Smokeless tobacco: Never Used  Substance Use Topics  . Alcohol use: No    Alcohol/week: 0.0 standard drinks     Current Meds  Medication Sig  . ALPRAZolam (XANAX) 0.5 MG tablet Take 1 tablet (0.5 mg total) by mouth 2 (two) times daily as needed.  [name] amLODipine (NORVASC) 10 MG tablet TAKE 1 TABLET BY MOUTH DAILY *DOSE INCREASED 11/23/18*  . aspirin EC 81 MG tablet Take 2 tablets (162 mg total) by mouth 2 (two) times daily.  [name] atorvastatin (LIPITOR) 40 MG tablet Take 1 tablet (40 mg total) by mouth daily at 6 PM.  .  Cholecalciferol (VITAMIN D3) 25 MCG (1000 UT) CHEW Chew 2,000 Units by mouth daily.  . clopidogrel (PLAVIX) 75 MG tablet Take 1 tablet (75 mg total) by mouth daily.  [name] escitalopram (LEXAPRO) 20 MG tablet TAKE 1 TABLET BY MOUTH DAILY  . fluticasone (FLONASE) 50 MCG/ACT nasal spray Place 1 spray into both nostrils daily.  . Fluticasone-Salmeterol (ADVAIR) 100-50 MCG/DOSE AEPB INHALE 1 PUFF BY MOUTH TWICE DAILY  . isosorbide mononitrate (IMDUR) 30 MG 24 hr tablet Take 3 tablets (90 mg total) by mouth daily.  Sunday Corn Mineral Oil-Mineral Oil (RETAINE MGD OP) Apply 1 application to eye daily. Right Eye  . LINZESS 145 MCG CAPS capsule TAKE 1 CAPSULE BY MOUTH DAILY  . losartan (COZAAR) 100 MG tablet Take 1 tablet (100 mg total) by mouth daily.  . metoprolol tartrate (LOPRESSOR) 50 MG tablet Take 1.5 tablets (75 mg total) by mouth 2 (two) times daily.  . nitroGLYCERIN (NITROSTAT) 0.4 MG SL tablet DISSOLVE ONE TABLET UNDER THE TONGUE AS NEEDED FOR CHEST PAIN EVERY 5 MINUTES UP TO 3 TIMES. IF NO RELIEF CALL 911.  . pantoprazole (PROTONIX) 40 MG tablet Take 1 tablet (40 mg total) by mouth 2 (two) times daily before a meal.  . potassium chloride 20 MEQ/15ML (10%) SOLN Take 15 mLs (20 mEq total) by mouth daily.  . prednisoLONE acetate (PRED FORTE) 1 % ophthalmic suspension Place 1 drop into both eyes at bedtime.   . tamsulosin (FLOMAX) 0.4 MG CAPS capsule Take 1 capsule (0.4 mg total) by mouth daily.  [name] thyroid (NP THYROID) 120 MG tablet Take 1 tablet (120 mg total) by mouth daily before breakfast.  . traMADol (ULTRAM) 50 MG tablet Take 1 tablet (50 mg total) by mouth every 12 (twelve) hours as needed.  . triamcinolone (KENALOG) 0.1 % Apply 1 application topically 2 (two) times daily.   Current Facility-Administered Medications for the 06/11/20 encounter (Video Visit) with Ailene Ards, NP  Medication  . NONFORMULARY OR COMPOUNDED ITEM 0.2 mL    ROS:  Review of Systems  Constitutional: Negative for fever.   HENT: Positive for congestion.   Respiratory: Positive for cough, shortness of breath and wheezing.      Objective:   Today's Vitals: There were no vitals taken for this visit. Vitals with BMI 05/20/2020 05/15/2020 05/15/2020  Height 5\' 4"  - -  Weight 161 lbs 14 oz - -  BMI  53.29 - -  Systolic 924 268 341  Diastolic 91 93 89  Pulse 83 50 60     Physical Exam Comprehensive physical exam not completed today as office visit was conducted remotely.  Patient sounded fairly well over the phone, she did cough a couple times but did not seem especially short of breath as she never had to stop talking or to catch her breath.  Patient was alert and oriented, and appeared to have appropriate judgment.       Assessment and Plan   1. Cough      Plan: 1.  Current etiology unclear however patient does tell me she feels fairly ill and has had similar symptoms before with improvement when prescribed azithromycin, prednisone, and cough suppression.  Will order a Z-Pak, prednisone burst, and Tessalon Perles that she can take as needed for cough suppression.  Because she is having some episodes of wheezing we will also prescribe as needed albuterol inhaler.  She was told to call the office if her symptoms persist or worsen into next week at which point we may need to consider chest x-ray.  She tells me she understands.  I also recommended that if her symptoms do not improve over the weekend that she consider going and getting retested for COVID-19 as I have seen a few cases recently where initial testing has been negative but repeat testing comes back positive.  She tells me she understands.   Tests ordered No orders of the defined types were placed in this encounter.     No orders of the defined types were placed in this encounter.   Patient to follow-up as scheduled later this month or sooner as needed.  Total time spent on the telephone today was 8 minutes and 25 seconds.  Ailene Ards,  NP

## 2020-06-12 ENCOUNTER — Ambulatory Visit (HOSPITAL_COMMUNITY): Payer: 59

## 2020-06-12 ENCOUNTER — Ambulatory Visit (INDEPENDENT_AMBULATORY_CARE_PROVIDER_SITE_OTHER): Payer: 59 | Admitting: Internal Medicine

## 2020-06-19 ENCOUNTER — Encounter: Payer: Self-pay | Admitting: General Surgery

## 2020-06-19 ENCOUNTER — Ambulatory Visit (INDEPENDENT_AMBULATORY_CARE_PROVIDER_SITE_OTHER): Payer: 59 | Admitting: General Surgery

## 2020-06-19 ENCOUNTER — Other Ambulatory Visit: Payer: Self-pay

## 2020-06-19 VITALS — BP 157/102 | HR 81 | Temp 97.9°F | Resp 16 | Ht 64.0 in | Wt 162.0 lb

## 2020-06-19 DIAGNOSIS — R1084 Generalized abdominal pain: Secondary | ICD-10-CM | POA: Diagnosis not present

## 2020-06-19 NOTE — Progress Notes (Deleted)
Cardiology Office Note  Date: 06/19/2020   ID: Bethany Wang, DOB 1965-10-24, MRN [id number]  PCP:  [name], MD  Cardiologist:  [name], MD Electrophysiologist:  None   Chief Complaint: 59-month cardiac follow-up  History of Present Illness: Bethany Wang is a 55 y.o. female with a history of CAD, HTN, DM 2, falls, mixed hyperlipidemia, GERD, breast cancer, seizure, PUD, CVA.  Last seen by Dr. Domenic Polite 07/11/2019.  She had been under tremendous psychosocial stress.  She was following with a therapist and her PCP regularly.  Her medications were stable from a cardiac perspective.  Having intermittent trouble swallowing pills and food.  Having dysphagia and emesis of gastric contents.  Plans were to follow-up with GI.  Having recurrent headaches and plans to follow-up with neurology.  Plans were to continue medical therapy with aspirin, Plavix, Norvasc, Lipitor, Imdur, losartan, Lopressor and as needed nitroglycerin.   Past Medical History:  Diagnosis Date  . Anemia   . Anxiety   . Bilateral breast cysts 12/30/2015  . Brain tumor ([place])   . Breast cancer ([place])    Left Breast Cancer  . Breast disorder   . BV (bacterial vaginosis) 09/09/2015  . Coronary atherosclerosis of native coronary artery    a. s/p DES to RCA in 2013 b. DES to LAD in 2014 c. cath in 04/2016 showing patent LAD stent with D2 jailed by LAD stent and CTO of RCA with left to right collaterals present  . Cyst of pharynx or nasopharynx    [name]  . Decreased libido 12/25/2018  . Depression   . Diabetes mellitus without complication ([place])   . Essential hypertension   . Falls    x 3-4 in past year  . GERD (gastroesophageal reflux disease)   . Headache   . History of hematuria   . Mixed hyperlipidemia   . Personal history of chemotherapy    Left Breast Cancer  . Personal history of radiation therapy    Left Breast Cancer  . Pre-diabetes   . PUD (peptic ulcer disease)    . Seizures ([place])   . Shingles 09/09/2015  . Stroke ([place])    12-2017 on Plavix, only deficit is headaches  . Suicide attempt ([place])    3 attempts in remote past  . Trichimoniasis 08/20/2019   Treated 08/20/19   . Uterine cancer ([place])   . Vitamin D deficiency disease 04/03/2019    Past Surgical History:  Procedure Laterality Date  . ABDOMINAL HYSTERECTOMY    . BIOPSY  11/28/2019   Procedure: BIOPSY;  Surgeon: Rogene Houston, MD;  [facility];  Service: Endoscopy;;  antral, gastric body   . BLADDER SURGERY    . BREAST LUMPECTOMY Left   . BREAST LUMPECTOMY Right 02/24/2018   Procedure: RIGHT BREAST LUMPECTOMY ERAS PATHWAY;  Surgeon: Jovita Kussmaul, MD;  Location: [facility];  Service: General;  Laterality: Right;  . CARDIAC CATHETERIZATION N/A 05/10/2016   Procedure: Left Heart Cath and Coronary Angiography;  Surgeon: Belva Crome, MD;  Location: [facility];  Service: Cardiovascular;  Laterality: N/A;  . COLONOSCOPY  May 2010   [place]man: normal rectum, internal hemorrhoids, , benign colonic polyp  . COLONOSCOPY N/A 01/05/2017   Procedure: COLONOSCOPY;  Surgeon: Rogene Houston, MD;  [facility];  Service: Endoscopy;  Laterality: N/A;  1:00  . ESOPHAGEAL DILATION  11/28/2019   Procedure: ESOPHAGEAL DILATION;  Surgeon: Rogene Houston, MD;  [facility];  Service: Endoscopy;;  . ESOPHAGOGASTRODUODENOSCOPY     2001 Dr. Amedeo Plenty: distal esophagitis, small hiatal hernia,. Dr. Tamala Julian 2006? no records available currently, pt also reports  EGD a few years ago with Dr. Gala Romney, do not have these reports anywhere in medical records  . ESOPHAGOGASTRODUODENOSCOPY  06/02/2011   IRS:WNIOEV pill impaction as described above s/p dilation of a probable cervical esophageal web/bx abnormal esophageal and gastric mucosa. + H.pylori gastritis   . ESOPHAGOGASTRODUODENOSCOPY (EGD) WITH PROPOFOL N/A 11/28/2019   Procedure: ESOPHAGOGASTRODUODENOSCOPY (EGD) WITH PROPOFOL;  Surgeon:  Rogene Houston, MD;  [facility];  Service: Endoscopy;  Laterality: N/A;  1020  . EYE SURGERY     Removed glass  . HAND SURGERY Right   . Left breast lumpectomy     Benign  . LEFT HEART CATH AND CORONARY ANGIOGRAPHY N/A 10/25/2018   Procedure: LEFT HEART CATH AND CORONARY ANGIOGRAPHY;  Surgeon: Wellington Hampshire, MD;  Location: [facility];  Service: Cardiovascular;  Laterality: N/A;  . LEFT HEART CATHETERIZATION WITH CORONARY ANGIOGRAM N/A 07/03/2014   Procedure: LEFT HEART CATHETERIZATION WITH CORONARY ANGIOGRAM;  Surgeon: Belva Crome, MD;  [facility];  Service: Cardiovascular;  Laterality: N/A;  . POLYPECTOMY  01/05/2017   Procedure: POLYPECTOMY;  Surgeon: Rogene Houston, MD;  [facility];  Service: Endoscopy;;  colon  . SHOULDER SURGERY Left     Current Outpatient Medications  Medication Sig Dispense Refill  . albuterol (VENTOLIN HFA) 108 (90 Base) MCG/ACT inhaler Inhale 2 puffs into the lungs every 6 (six) hours as needed for wheezing or shortness of breath. 8 g 0  . ALPRAZolam (XANAX) 0.5 MG tablet Take 1 tablet (0.5 mg total) by mouth 2 (two) times daily as needed. 60 tablet 3  . amLODipine (NORVASC) 10 MG tablet TAKE 1 TABLET BY MOUTH DAILY *DOSE INCREASED 11/23/18* 30 tablet 1  . aspirin EC 81 MG tablet Take 2 tablets (162 mg total) by mouth 2 (two) times daily. 180 tablet 1  . atorvastatin (LIPITOR) 40 MG tablet Take 1 tablet (40 mg total) by mouth daily at 6 PM. 90 tablet 3  . Cholecalciferol (VITAMIN D3) 25 MCG (1000 UT) CHEW Chew 2,000 Units by mouth daily. 180 tablet 1  . clopidogrel (PLAVIX) 75 MG tablet Take 1 tablet (75 mg total) by mouth daily. 90 tablet 3  . escitalopram (LEXAPRO) 20 MG tablet TAKE 1 TABLET BY MOUTH DAILY 30 tablet 1  . fluticasone (FLONASE) 50 MCG/ACT nasal spray Place 1 spray into both nostrils daily. 9.9 mL 0  . Fluticasone-Salmeterol (ADVAIR) 100-50 MCG/DOSE AEPB INHALE 1 PUFF BY MOUTH TWICE DAILY 60 each 3   . isosorbide mononitrate (IMDUR) 30 MG 24 hr tablet Take 3 tablets (90 mg total) by mouth daily. 270 tablet 3  . Light Mineral Oil-Mineral Oil (RETAINE MGD OP) Apply 1 application to eye daily. Right Eye    . LINZESS 145 MCG CAPS capsule TAKE 1 CAPSULE BY MOUTH DAILY 30 capsule 1  . losartan (COZAAR) 100 MG tablet Take 1 tablet (100 mg total) by mouth daily. 90 tablet 3  . Medical ID Bracelet MISC 1 Units by Does not apply route daily. 1 each o  . metoprolol tartrate (LOPRESSOR) 50 MG tablet Take 1.5 tablets (75 mg total) by mouth 2 (two) times daily. 270 tablet 3  . nitroGLYCERIN (NITROSTAT) 0.4 MG SL tablet DISSOLVE ONE TABLET UNDER THE TONGUE AS NEEDED FOR CHEST PAIN EVERY  5 MINUTES UP TO 3 TIMES. IF NO RELIEF CALL 911. 25 tablet 1  . pantoprazole (PROTONIX) 40 MG tablet Take 1 tablet (40 mg total) by mouth 2 (two) times daily before a meal. 180 tablet 1  . potassium chloride 20 MEQ/15ML (10%) SOLN Take 15 mLs (20 mEq total) by mouth daily. 1350 mL 3  . prednisoLONE acetate (PRED FORTE) 1 % ophthalmic suspension Place 1 drop into both eyes at bedtime.     . predniSONE (DELTASONE) 20 MG tablet Take 2 tablets (40 mg total) by mouth daily with breakfast. 10 tablet 0  . tamsulosin (FLOMAX) 0.4 MG CAPS capsule Take 1 capsule (0.4 mg total) by mouth daily. 30 capsule 0  . thyroid (NP THYROID) 120 MG tablet Take 1 tablet (120 mg total) by mouth daily before breakfast. 90 tablet 1  . traMADol (ULTRAM) 50 MG tablet Take 1 tablet (50 mg total) by mouth every 12 (twelve) hours as needed. 20 tablet 0  . triamcinolone (KENALOG) 0.1 % Apply 1 application topically 2 (two) times daily. 30 g 0   Current Facility-Administered Medications  Medication Dose Route Frequency Provider Last Rate Last Admin  . NONFORMULARY OR COMPOUNDED ITEM 0.2 mL  0.2 mL Intramuscular Q7 days [name], Nimish C, MD   0.2 mL at 10/23/19 1002   Allergies:  Bee venom, Penicillins, Shellfish allergy, and Zofran [ondansetron]   Social  History: The patient  reports that she quit smoking about 8 years ago. Her smoking use included cigarettes and cigars. She started smoking about 36 years ago. She has a 6.40 pack-year smoking history. She has never used smokeless tobacco. She reports that she does not drink alcohol and does not use drugs.   Family History: The patient's family history includes ADD / ADHD in her daughter, son, and son; Alzheimer's disease in her paternal aunt; Aneurysm in her mother; Bipolar disorder in her daughter, son, and son; Cancer in her father, maternal uncle, and paternal aunt; Cirrhosis in her maternal uncle; Colon cancer in her paternal grandfather; Heart attack in her brother; Heart disease in her brother; Mental illness in her daughter, son, and son.   ROS:  Please see the history of present illness. Otherwise, complete review of systems is positive for none.  All other systems are reviewed and negative.   Physical Exam: VS:  There were no vitals taken for this visit., BMI There is no height or weight on file to calculate BMI.  Wt Readings from Last 3 Encounters:  06/19/20 162 lb (73.5 kg)  05/20/20 161 lb 14.4 oz (73.4 kg)  05/15/20 163 lb (73.9 kg)    General: Patient appears comfortable at rest. HEENT: Conjunctiva and lids normal, oropharynx clear with moist mucosa. Neck: Supple, no elevated JVP or carotid bruits, no thyromegaly. Lungs: Clear to auscultation, nonlabored breathing at rest. Cardiac: Regular rate and rhythm, no S3 or significant systolic murmur, no pericardial rub. Abdomen: Soft, nontender, no hepatomegaly, bowel sounds present, no guarding or rebound. Extremities: No pitting edema, distal pulses 2+. Skin: Warm and dry. Musculoskeletal: No kyphosis. Neuropsychiatric: Alert and oriented x3, affect grossly appropriate.  ECG:  {EKG/Telemetry Strips Reviewed:[phone]}  Recent Labwork: 05/15/2020: ALT 26; AST 25; BUN 9; Creatinine, Ser 0.71; Hemoglobin 12.9; Platelets 244;  Potassium 3.7; Sodium 140 05/20/2020: TSH 4.24     Component Value Date/Time   CHOL 218 (H) 10/29/2019 1121   TRIG 154 (H) 10/29/2019 1121   HDL 50 10/29/2019 1121   CHOLHDL 4.4 10/29/2019 1121   VLDL  14 10/26/2018 0415   LDLCALC 139 (H) 10/29/2019 1121    Other Studies Reviewed Today:  Cardiac catheterization 10/25/2018:  Dist LAD lesion is 50% stenosed.  Ost 2nd Diag lesion is 75% stenosed.  1st Mrg lesion is 60% stenosed.  Prox Cx to Dist Cx lesion is 30% stenosed.  2nd Mrg lesion is 20% stenosed.  Prox RCA to Mid RCA lesion is 100% stenosed.  Mid LAD-1 lesion is 20% stenosed.  Mid LAD-2 lesion is 30% stenosed.  Prox LAD lesion is 30% stenosed.  2nd Diag lesion is 70% stenosed.  1. Significant underlying two-vessel coronary artery disease with widely patent LAD stent with minimal restenosis. Chronically occluded RCA stent with well-developed left-to-right collaterals. Jailed second diagonal has a stable 70% ostial stenosis. Moderate OM1 disease. 2. Left ventricular angiography was not performed. EF is normal by echo. 3. Normal left ventricular end-diastolic pressure at 11 mmHg.  Recommendations: I do not see a culprit for the patient's presumed unstable angina. Her coronary anatomy is unchanged from 2018 and if anything, the disease in second diagonal and OM1 appear improved in appearance. I recommend continuing aggressive medical therapy.   Assessment and Plan:  1. CAD in native artery   2. Essential hypertension, benign      Medication Adjustments/Labs and Tests Ordered: Current medicines are reviewed at length with the patient today.  Concerns regarding medicines are outlined above.   Disposition: Follow-up with ***  Signed, Levell July, NP 06/19/2020 4:27 PM    [facility] at [facility] [place], [place], [place] [address] Phone: [phone]; Fax: [phone]

## 2020-06-20 ENCOUNTER — Ambulatory Visit: Payer: 59 | Admitting: Family Medicine

## 2020-06-20 DIAGNOSIS — I10 Essential (primary) hypertension: Secondary | ICD-10-CM

## 2020-06-20 DIAGNOSIS — I2510 Atherosclerotic heart disease of native coronary artery without angina pectoris: Secondary | ICD-10-CM

## 2020-06-21 NOTE — Progress Notes (Signed)
Bethany Wang; [id number]; 07/06/1965   HPI Patient is a 55 year old black female who was referred to my care by Dr. Laural Golden for evaluation and treatment of nonspecific abdominal pain, nausea, and vomiting.  Patient has had issues with her abdominal pain and nausea for many months since the summer 2021.  She states that she continues to have nonspecific abdominal pain, but then occasionally points to the right lower quadrant.  The pain comes and goes.  She has some postprandial abdominal pain with nausea and occasional vomiting.  She denies any fever or chills.  She states the pain can occur spontaneously or made worse with movement.  She has had extensive work-up by Dr. Laural Golden.  She has been tried on various medications for IBS but she continues to have the symptoms.  No jaundice has been noted. Past Medical History:  Diagnosis Date  . Anemia   . Anxiety   . Bilateral breast cysts 12/30/2015  . Brain tumor ([place])   . Breast cancer ([place])    Left Breast Cancer  . Breast disorder   . BV (bacterial vaginosis) 09/09/2015  . Coronary atherosclerosis of native coronary artery    a. s/p DES to RCA in 2013 b. DES to LAD in 2014 c. cath in 04/2016 showing patent LAD stent with D2 jailed by LAD stent and CTO of RCA with left to right collaterals present  . Cyst of pharynx or nasopharynx    [name]  . Decreased libido 12/25/2018  . Depression   . Diabetes mellitus without complication ([place])   . Essential hypertension   . Falls    x 3-4 in past year  . GERD (gastroesophageal reflux disease)   . Headache   . History of hematuria   . Mixed hyperlipidemia   . Personal history of chemotherapy    Left Breast Cancer  . Personal history of radiation therapy    Left Breast Cancer  . Pre-diabetes   . PUD (peptic ulcer disease)   . Seizures ([place])   . Shingles 09/09/2015  . Stroke ([place])    12-2017 on Plavix, only deficit is headaches  . Suicide attempt ([place])    3 attempts in remote  past  . Trichimoniasis 08/20/2019   Treated 08/20/19   . Uterine cancer ([place])   . Vitamin D deficiency disease 04/03/2019    Past Surgical History:  Procedure Laterality Date  . ABDOMINAL HYSTERECTOMY    . BIOPSY  11/28/2019   Procedure: BIOPSY;  Surgeon: Rogene Houston, MD;  [facility];  Service: Endoscopy;;  antral, gastric body   . BLADDER SURGERY    . BREAST LUMPECTOMY Left   . BREAST LUMPECTOMY Right 02/24/2018   Procedure: RIGHT BREAST LUMPECTOMY ERAS PATHWAY;  Surgeon: Jovita Kussmaul, MD;  Location: [facility];  Service: General;  Laterality: Right;  . CARDIAC CATHETERIZATION N/A 05/10/2016   Procedure: Left Heart Cath and Coronary Angiography;  Surgeon: Belva Crome, MD;  Location: [facility];  Service: Cardiovascular;  Laterality: N/A;  . COLONOSCOPY  May 2010   Fleishman: normal rectum, internal hemorrhoids, , benign colonic polyp  . COLONOSCOPY N/A 01/05/2017   Procedure: COLONOSCOPY;  Surgeon: Rogene Houston, MD;  [facility];  Service: Endoscopy;  Laterality: N/A;  1:00  . ESOPHAGEAL DILATION  11/28/2019   Procedure: ESOPHAGEAL DILATION;  Surgeon: Rogene Houston, MD;  [facility];  Service: Endoscopy;;  . ESOPHAGOGASTRODUODENOSCOPY     2001 Dr. Amedeo Plenty:  distal esophagitis, small hiatal hernia,. Dr. Tamala Julian 2006? no records available currently, pt also reports  EGD a few years ago with Dr. Gala Romney, do not have these reports anywhere in medical records  . ESOPHAGOGASTRODUODENOSCOPY  06/02/2011   EZM:OQHUTM pill impaction as described above s/p dilation of a probable cervical esophageal web/bx abnormal esophageal and gastric mucosa. + H.pylori gastritis   . ESOPHAGOGASTRODUODENOSCOPY (EGD) WITH PROPOFOL N/A 11/28/2019   Procedure: ESOPHAGOGASTRODUODENOSCOPY (EGD) WITH PROPOFOL;  Surgeon: Rogene Houston, MD;  [facility];  Service: Endoscopy;  Laterality: N/A;  1020  . EYE SURGERY     Removed glass  . HAND SURGERY Right   .  Left breast lumpectomy     Benign  . LEFT HEART CATH AND CORONARY ANGIOGRAPHY N/A 10/25/2018   Procedure: LEFT HEART CATH AND CORONARY ANGIOGRAPHY;  Surgeon: Wellington Hampshire, MD;  Location: [facility];  Service: Cardiovascular;  Laterality: N/A;  . LEFT HEART CATHETERIZATION WITH CORONARY ANGIOGRAM N/A 07/03/2014   Procedure: LEFT HEART CATHETERIZATION WITH CORONARY ANGIOGRAM;  Surgeon: Belva Crome, MD;  [facility];  Service: Cardiovascular;  Laterality: N/A;  . POLYPECTOMY  01/05/2017   Procedure: POLYPECTOMY;  Surgeon: Rogene Houston, MD;  [facility];  Service: Endoscopy;;  colon  . SHOULDER SURGERY Left     Family History  Problem Relation Age of Onset  . Colon cancer Paternal Grandfather   . Cancer Father   . Cancer Maternal Uncle   . Alzheimer's disease Paternal Aunt   . Cirrhosis Maternal Uncle   . Cancer Paternal Aunt        lung  . Aneurysm Mother        brain  . Heart disease Brother   . Heart attack Brother   . Mental illness Daughter   . ADD / ADHD Daughter   . Bipolar disorder Daughter   . Mental illness Son   . ADD / ADHD Son   . Bipolar disorder Son   . Mental illness Son   . ADD / ADHD Son   . Bipolar disorder Son     Current Outpatient Medications on File Prior to Visit  Medication Sig Dispense Refill  . albuterol (VENTOLIN HFA) 108 (90 Base) MCG/ACT inhaler Inhale 2 puffs into the lungs every 6 (six) hours as needed for wheezing or shortness of breath. 8 g 0  . ALPRAZolam (XANAX) 0.5 MG tablet Take 1 tablet (0.5 mg total) by mouth 2 (two) times daily as needed. 60 tablet 3  . amLODipine (NORVASC) 10 MG tablet TAKE 1 TABLET BY MOUTH DAILY *DOSE INCREASED 11/23/18* 30 tablet 1  . aspirin EC 81 MG tablet Take 2 tablets (162 mg total) by mouth 2 (two) times daily. 180 tablet 1  . atorvastatin (LIPITOR) 40 MG tablet Take 1 tablet (40 mg total) by mouth daily at 6 PM. 90 tablet 3  . Cholecalciferol (VITAMIN D3) 25 MCG (1000 UT)  CHEW Chew 2,000 Units by mouth daily. 180 tablet 1  . clopidogrel (PLAVIX) 75 MG tablet Take 1 tablet (75 mg total) by mouth daily. 90 tablet 3  . escitalopram (LEXAPRO) 20 MG tablet TAKE 1 TABLET BY MOUTH DAILY 30 tablet 1  . fluticasone (FLONASE) 50 MCG/ACT nasal spray Place 1 spray into both nostrils daily. 9.9 mL 0  . Fluticasone-Salmeterol (ADVAIR) 100-50 MCG/DOSE AEPB INHALE 1 PUFF BY MOUTH TWICE DAILY 60 each 3  . isosorbide mononitrate (IMDUR) 30 MG 24 hr tablet Take 3  tablets (90 mg total) by mouth daily. 270 tablet 3  . Light Mineral Oil-Mineral Oil (RETAINE MGD OP) Apply 1 application to eye daily. Right Eye    . LINZESS 145 MCG CAPS capsule TAKE 1 CAPSULE BY MOUTH DAILY 30 capsule 1  . losartan (COZAAR) 100 MG tablet Take 1 tablet (100 mg total) by mouth daily. 90 tablet 3  . Medical ID Bracelet MISC 1 Units by Does not apply route daily. 1 each o  . metoprolol tartrate (LOPRESSOR) 50 MG tablet Take 1.5 tablets (75 mg total) by mouth 2 (two) times daily. 270 tablet 3  . nitroGLYCERIN (NITROSTAT) 0.4 MG SL tablet DISSOLVE ONE TABLET UNDER THE TONGUE AS NEEDED FOR CHEST PAIN EVERY 5 MINUTES UP TO 3 TIMES. IF NO RELIEF CALL 911. 25 tablet 1  . pantoprazole (PROTONIX) 40 MG tablet Take 1 tablet (40 mg total) by mouth 2 (two) times daily before a meal. 180 tablet 1  . potassium chloride 20 MEQ/15ML (10%) SOLN Take 15 mLs (20 mEq total) by mouth daily. 1350 mL 3  . prednisoLONE acetate (PRED FORTE) 1 % ophthalmic suspension Place 1 drop into both eyes at bedtime.     . predniSONE (DELTASONE) 20 MG tablet Take 2 tablets (40 mg total) by mouth daily with breakfast. 10 tablet 0  . tamsulosin (FLOMAX) 0.4 MG CAPS capsule Take 1 capsule (0.4 mg total) by mouth daily. 30 capsule 0  . thyroid (NP THYROID) 120 MG tablet Take 1 tablet (120 mg total) by mouth daily before breakfast. 90 tablet 1  . traMADol (ULTRAM) 50 MG tablet Take 1 tablet (50 mg total) by mouth every 12 (twelve) hours as needed. 20  tablet 0  . triamcinolone (KENALOG) 0.1 % Apply 1 application topically 2 (two) times daily. 30 g 0   Current Facility-Administered Medications on File Prior to Visit  Medication Dose Route Frequency Provider Last Rate Last Admin  . NONFORMULARY OR COMPOUNDED ITEM 0.2 mL  0.2 mL Intramuscular Q7 days [name], Nimish C, MD   0.2 mL at 10/23/19 1002    Allergies  Allergen Reactions  . Bee Venom Anaphylaxis and Other (See Comments)    "throat swelled and had to the hospital" - Yellow jacket sting  . Penicillins Anaphylaxis and Other (See Comments)    Has patient had a PCN reaction causing immediate rash, facial/tongue/throat swelling, SOB or lightheadedness with hypotension: Yes Has patient had a PCN reaction causing severe rash involving mucus membranes or skin necrosis: Yes Has patient had a PCN reaction that required hospitalization Yes Has patient had a PCN reaction occurring within the last 10 years: Yes If all of the above answers are "NO", then may proceed with Cephalosporin use. Patient states she carries an epi-pen  . Shellfish Allergy Swelling  . Zofran [Ondansetron] Itching    Social History   Substance and Sexual Activity  Alcohol Use No  . Alcohol/week: 0.0 standard drinks    Social History   Tobacco Use  Smoking Status Former Smoker  . Packs/day: 0.20  . Years: 32.00  . Pack years: 6.40  . Types: Cigarettes, Cigars  . Start date: 07/05/1983  . Quit date: 04/03/2012  . Years since quitting: 8.2  Smokeless Tobacco Never Used    Review of Systems  Constitutional: Positive for malaise/fatigue.  HENT: Positive for sinus pain.   Eyes: Positive for blurred vision, double vision and pain.  Respiratory: Negative.   Cardiovascular: Negative.   Gastrointestinal: Positive for abdominal pain and nausea.  Genitourinary: Negative.   Musculoskeletal: Positive for back pain, joint pain and neck pain.  Neurological: Negative.   Endo/Heme/Allergies: Negative.    Psychiatric/Behavioral: Negative.     Objective   Vitals:   06/19/20 1005  BP: (!) 157/102  Pulse: 81  Resp: 16  Temp: 97.9 F (36.6 C)  SpO2: 96%    Physical Exam Vitals reviewed.  Constitutional:      Appearance: She is well-developed and normal weight. She is not ill-appearing.  HENT:     Head: Normocephalic and atraumatic.  Cardiovascular:     Rate and Rhythm: Normal rate.     Heart sounds: Normal heart sounds. No murmur heard. No friction rub. No gallop.      Comments: Easily palpable femoral pulses. Abdominal:     General: Bowel sounds are normal. There is no distension or abdominal bruit. There are no signs of injury.     Palpations: Abdomen is soft. There is no splenomegaly or mass.     Tenderness: There is generalized abdominal tenderness. There is no rebound. Negative signs include Murphy's sign and McBurney's sign.     Hernia: No hernia is present.     Comments: Abdominal tenderness is migratory and varied throughout the examination.  No rigidity is noted.  Skin:    General: Skin is warm and dry.  Neurological:     Mental Status: She is alert and oriented to person, place, and time.    Multiple radiologic examinations reviewed including ultrasound and CT scan of the abdomen and pelvis Previous lab work reviewed Assessment  Abdominal pain of unknown etiology.  I do not see anything acutely surgical at this time.  She has had multiple abdominal CTs which showed just some atherosclerotic disease.  I think we need to rule out chronic mesenteric ischemia. Plan   Have ordered CTA of the abdomen and pelvis to further delineate the vasculature.  Further management is pending those results.

## 2020-06-26 ENCOUNTER — Other Ambulatory Visit (INDEPENDENT_AMBULATORY_CARE_PROVIDER_SITE_OTHER): Payer: Self-pay | Admitting: Internal Medicine

## 2020-06-26 ENCOUNTER — Other Ambulatory Visit: Payer: Self-pay | Admitting: Cardiology

## 2020-06-26 ENCOUNTER — Other Ambulatory Visit (INDEPENDENT_AMBULATORY_CARE_PROVIDER_SITE_OTHER): Payer: Self-pay | Admitting: Nurse Practitioner

## 2020-06-26 DIAGNOSIS — I25119 Atherosclerotic heart disease of native coronary artery with unspecified angina pectoris: Secondary | ICD-10-CM

## 2020-06-30 LAB — SEDIMENTATION RATE: Sed Rate: 34 mm/h — ABNORMAL HIGH (ref 0–30)

## 2020-07-03 ENCOUNTER — Other Ambulatory Visit: Payer: Self-pay

## 2020-07-03 ENCOUNTER — Ambulatory Visit (INDEPENDENT_AMBULATORY_CARE_PROVIDER_SITE_OTHER): Payer: 59 | Admitting: Internal Medicine

## 2020-07-03 ENCOUNTER — Encounter (INDEPENDENT_AMBULATORY_CARE_PROVIDER_SITE_OTHER): Payer: Self-pay | Admitting: Internal Medicine

## 2020-07-03 VITALS — BP 133/78 | HR 87 | Temp 97.8°F | Resp 18 | Ht 64.0 in | Wt 164.3 lb

## 2020-07-03 DIAGNOSIS — E559 Vitamin D deficiency, unspecified: Secondary | ICD-10-CM | POA: Diagnosis not present

## 2020-07-03 DIAGNOSIS — E782 Mixed hyperlipidemia: Secondary | ICD-10-CM

## 2020-07-03 DIAGNOSIS — I25119 Atherosclerotic heart disease of native coronary artery with unspecified angina pectoris: Secondary | ICD-10-CM | POA: Diagnosis not present

## 2020-07-03 DIAGNOSIS — Z1159 Encounter for screening for other viral diseases: Secondary | ICD-10-CM

## 2020-07-03 DIAGNOSIS — I10 Essential (primary) hypertension: Secondary | ICD-10-CM | POA: Diagnosis not present

## 2020-07-03 NOTE — Progress Notes (Signed)
Metrics: Intervention Frequency ACO  Documented Smoking Status Yearly  Screened one or more times in 24 months  Cessation Counseling or  Active cessation medication Past 24 months  Past 24 months   Guideline developer: UpToDate (See UpToDate for funding source) Date Released: 2014       Wellness Office Visit  Subjective:  Patient ID: Bethany Wang, female    DOB: 07-30-1965  Age: 55 y.o. MRN: [id number]  CC: This lady was scheduled for an annual physical today but we had many things to discuss so we will postpone this for another time.  She has a history of hypertension, dyslipidemia, coronary artery disease, vitamin D deficiency. HPI  Approximately 1 month ago, she decided to stop taking all medications as she was getting rather confused as to what medication she should be taking.  She had been having some nonspecific abdominal pain.  She was investigated by gastroenterology and then referred to surgery.  Dr. Arnoldo Morale, surgery, has ordered a CT scan to look at vasculature.  Since stopping all the medications, her abdominal pain has improved significantly.  She says she is also sleeping better now. She denies any chest pain, dyspnea, palpitations or limb weakness.  Past Medical History:  Diagnosis Date  . Anemia   . Anxiety   . Bilateral breast cysts 12/30/2015  . Brain tumor ([place])   . Breast cancer ([place])    Left Breast Cancer  . Breast disorder   . BV (bacterial vaginosis) 09/09/2015  . Coronary atherosclerosis of native coronary artery    a. s/p DES to RCA in 2013 b. DES to LAD in 2014 c. cath in 04/2016 showing patent LAD stent with D2 jailed by LAD stent and CTO of RCA with left to right collaterals present  . Cyst of pharynx or nasopharynx    [name]  . Decreased libido 12/25/2018  . Depression   . Diabetes mellitus without complication ([place])   . Essential hypertension   . Falls    x 3-4 in past year  . GERD (gastroesophageal reflux disease)   .  Headache   . History of hematuria   . Mixed hyperlipidemia   . Personal history of chemotherapy    Left Breast Cancer  . Personal history of radiation therapy    Left Breast Cancer  . Pre-diabetes   . PUD (peptic ulcer disease)   . Seizures ([place])   . Shingles 09/09/2015  . Stroke ([place])    12-2017 on Plavix, only deficit is headaches  . Suicide attempt ([place])    3 attempts in remote past  . Trichimoniasis 08/20/2019   Treated 08/20/19   . Uterine cancer ([place])   . Vitamin D deficiency disease 04/03/2019   Past Surgical History:  Procedure Laterality Date  . ABDOMINAL HYSTERECTOMY    . BIOPSY  11/28/2019   Procedure: BIOPSY;  Surgeon: Rogene Houston, MD;  [facility];  Service: Endoscopy;;  antral, gastric body   . BLADDER SURGERY    . BREAST LUMPECTOMY Left   . BREAST LUMPECTOMY Right 02/24/2018   Procedure: RIGHT BREAST LUMPECTOMY ERAS PATHWAY;  Surgeon: Jovita Kussmaul, MD;  Location: [facility];  Service: General;  Laterality: Right;  . CARDIAC CATHETERIZATION N/A 05/10/2016   Procedure: Left Heart Cath and Coronary Angiography;  Surgeon: Belva Crome, MD;  Location: [facility];  Service: Cardiovascular;  Laterality: N/A;  . COLONOSCOPY  May 2010   Fleishman: normal rectum, internal hemorrhoids, , benign colonic  polyp  . COLONOSCOPY N/A 01/05/2017   Procedure: COLONOSCOPY;  Surgeon: Rogene Houston, MD;  [facility];  Service: Endoscopy;  Laterality: N/A;  1:00  . ESOPHAGEAL DILATION  11/28/2019   Procedure: ESOPHAGEAL DILATION;  Surgeon: Rogene Houston, MD;  [facility];  Service: Endoscopy;;  . ESOPHAGOGASTRODUODENOSCOPY     2001 Dr. Amedeo Plenty: distal esophagitis, small hiatal hernia,. Dr. Tamala Julian 2006? no records available currently, pt also reports  EGD a few years ago with Dr. Gala Romney, do not have these reports anywhere in medical records  . ESOPHAGOGASTRODUODENOSCOPY  06/02/2011   INO:MVEHMC pill impaction as described above s/p dilation of a  probable cervical esophageal web/bx abnormal esophageal and gastric mucosa. + H.pylori gastritis   . ESOPHAGOGASTRODUODENOSCOPY (EGD) WITH PROPOFOL N/A 11/28/2019   Procedure: ESOPHAGOGASTRODUODENOSCOPY (EGD) WITH PROPOFOL;  Surgeon: Rogene Houston, MD;  [facility];  Service: Endoscopy;  Laterality: N/A;  1020  . EYE SURGERY     Removed glass  . HAND SURGERY Right   . Left breast lumpectomy     Benign  . LEFT HEART CATH AND CORONARY ANGIOGRAPHY N/A 10/25/2018   Procedure: LEFT HEART CATH AND CORONARY ANGIOGRAPHY;  Surgeon: Wellington Hampshire, MD;  Location: [facility] [facility];  Service: Cardiovascular;  Laterality: N/A;  . LEFT HEART CATHETERIZATION WITH CORONARY ANGIOGRAM N/A 07/03/2014   Procedure: LEFT HEART CATHETERIZATION WITH CORONARY ANGIOGRAM;  Surgeon: Belva Crome, MD;  Location: [facility];  Service: Cardiovascular;  Laterality: N/A;  . POLYPECTOMY  01/05/2017   Procedure: POLYPECTOMY;  Surgeon: Rogene Houston, MD;  [facility];  Service: Endoscopy;;  colon  . SHOULDER SURGERY Left      Family History  Problem Relation Age of Onset  . Colon cancer Paternal Grandfather   . Cancer Father   . Cancer Maternal Uncle   . Alzheimer's disease Paternal Aunt   . Cirrhosis Maternal Uncle   . Cancer Paternal Aunt        lung  . Aneurysm Mother        brain  . Heart disease Brother   . Heart attack Brother   . Mental illness Daughter   . ADD / ADHD Daughter   . Bipolar disorder Daughter   . Mental illness Son   . ADD / ADHD Son   . Bipolar disorder Son   . Mental illness Son   . ADD / ADHD Son   . Bipolar disorder Son     Social History   Social History Narrative   Patient lives at new home with husband and extended family..On disability since age 83 yrs.   Social History   Tobacco Use  . Smoking status: Former Smoker    Packs/day: 0.20    Years: 32.00    Pack years: 6.40    Types: Cigarettes, Cigars    Start date: 07/05/1983    Quit  date: 04/03/2012    Years since quitting: 8.2  . Smokeless tobacco: Never Used  Substance Use Topics  . Alcohol use: No    Alcohol/week: 0.0 standard drinks    Current Meds  Medication Sig  . triamcinolone (KENALOG) 0.1 % Apply 1 application topically 2 (two) times daily.   Current Facility-Administered Medications for the 07/03/20 encounter (Office Visit) with [name], MD  Medication  . NONFORMULARY OR COMPOUNDED ITEM 0.2 mL     Flowsheet Row Office Visit from 08/15/2019 in [facility]  PHQ-9 Total  Score 15      Objective:   Today's Vitals: BP 133/78 (BP Location: Right Arm, Patient Position: Sitting, Cuff Size: Normal)   Pulse 87   Temp 97.8 F (36.6 C) (Temporal)   Resp 18   Ht 5\' 4"  (1.626 m)   Wt 164 lb 4.8 oz (74.5 kg)   SpO2 98%   BMI 28.20 kg/m  Vitals with BMI 07/03/2020 06/19/2020 06/11/2020  Height 5\' 4"  5\' 4"  -  Weight 164 lbs 5 oz 162 lbs -  BMI 69.48 54.62 -  Systolic 703 500 (No Data)  Diastolic 78 938 (No Data)  Pulse 87 81 -     Physical Exam  She seems much better than I had seen her previously and full of energy today.  Blood pressure is in a good range for her.     Assessment   1. Essential hypertension, benign   2. Mixed hyperlipidemia   3. Atherosclerosis of native coronary artery of native heart with angina pectoris ([place])   4. Vitamin D deficiency disease   5. Encounter for hepatitis C screening test for low risk patient       Tests ordered Orders Placed This Encounter  Procedures  . COMPLETE METABOLIC PANEL WITH GFR  . T3, free  . T4, free  . TSH  . Lipid panel  . VITAMIN D 25 Hydroxy (Vit-D Deficiency, Fractures)  . Hepatitis C antibody     Plan: 1. Her hypertension is reasonably well controlled without any medications. 2. We will check blood work to see if there are any abnormal changes that we need to be concerned about but it seems to me that she is feeling better without taking any medications  overall. 3. She tells me that she is training to run a marathon in October!  She says she has lots of energy, feels well now. 4. Follow-up in 6 months and further recommendations will depend on blood results.  I spent 30 minutes with this patient reviewing her medical record regarding evaluations by other specialists and reviewing all the medications that she previously was taking.   No orders of the defined types were placed in this encounter.   [name], MD

## 2020-07-04 LAB — COMPLETE METABOLIC PANEL WITH GFR
AG Ratio: 1.6 (calc) (ref 1.0–2.5)
ALT: 15 U/L (ref 6–29)
AST: 18 U/L (ref 10–35)
Albumin: 4.2 g/dL (ref 3.6–5.1)
Alkaline phosphatase (APISO): 118 U/L (ref 37–153)
BUN: 11 mg/dL (ref 7–25)
CO2: 27 mmol/L (ref 20–32)
Calcium: 9.5 mg/dL (ref 8.6–10.4)
Chloride: 106 mmol/L (ref 98–110)
Creat: 1.01 mg/dL (ref 0.50–1.05)
GFR, Est African American: 73 mL/min/{1.73_m2} (ref >=60)
GFR, Est Non African American: 63 mL/min/{1.73_m2} (ref >=60)
Globulin: 2.7 g/dL (calc) (ref 1.9–3.7)
Glucose, Bld: 135 mg/dL (ref 65–139)
Potassium: 3.7 mmol/L (ref 3.5–5.3)
Sodium: 142 mmol/L (ref 135–146)
Total Bilirubin: 0.3 mg/dL (ref 0.2–1.2)
Total Protein: 6.9 g/dL (ref 6.1–8.1)

## 2020-07-04 LAB — HEPATITIS C ANTIBODY
Hepatitis C Ab: NONREACTIVE
SIGNAL TO CUT-OFF: 0.01 (ref <1.00)

## 2020-07-04 LAB — LIPID PANEL
Cholesterol: 230 mg/dL — ABNORMAL HIGH (ref <200)
HDL: 53 mg/dL (ref >=50)
LDL Cholesterol (Calc): 146 mg/dL (calc) — ABNORMAL HIGH
Non-HDL Cholesterol (Calc): 177 mg/dL (calc) — ABNORMAL HIGH (ref <130)
Total CHOL/HDL Ratio: 4.3 (calc) (ref <5.0)
Triglycerides: 175 mg/dL — ABNORMAL HIGH (ref <150)

## 2020-07-04 LAB — VITAMIN D 25 HYDROXY (VIT D DEFICIENCY, FRACTURES): Vit D, 25-Hydroxy: 22 ng/mL — ABNORMAL LOW (ref 30–100)

## 2020-07-04 LAB — T3, FREE: T3, Free: 3.3 pg/mL (ref 2.3–4.2)

## 2020-07-04 LAB — TSH: TSH: 1.84 mIU/L

## 2020-07-04 LAB — T4, FREE: Free T4: 1.1 ng/dL (ref 0.8–1.8)

## 2020-07-07 ENCOUNTER — Telehealth (INDEPENDENT_AMBULATORY_CARE_PROVIDER_SITE_OTHER): Payer: Self-pay

## 2020-07-07 NOTE — Telephone Encounter (Signed)
Patient came in the office today and stated that she forgot to discuss at her last visit that every morning when she wakes up she has dark blood with dark blood clots running out of her nose, both nostrils and has been going on for a month. Patient denies feeling pressure or pain anywhere and states that it only happens in the mornings and just starts coming out. Patient would like to know what she needs to do?  Please advise if OK to schedule and when?

## 2020-07-07 NOTE — Telephone Encounter (Signed)
Okay to schedule an office appointment sometime this week.

## 2020-07-07 NOTE — Telephone Encounter (Signed)
Called patient and scheduled her for an appointment tomorrow at 8:30am. Patient verbalized an understanding.

## 2020-07-08 ENCOUNTER — Other Ambulatory Visit: Payer: Self-pay

## 2020-07-08 ENCOUNTER — Ambulatory Visit (INDEPENDENT_AMBULATORY_CARE_PROVIDER_SITE_OTHER): Payer: 59 | Admitting: Internal Medicine

## 2020-07-08 ENCOUNTER — Encounter (INDEPENDENT_AMBULATORY_CARE_PROVIDER_SITE_OTHER): Payer: Self-pay | Admitting: Internal Medicine

## 2020-07-08 VITALS — BP 122/82 | Temp 96.9°F | Ht 64.0 in | Wt 164.6 lb

## 2020-07-08 DIAGNOSIS — R040 Epistaxis: Secondary | ICD-10-CM | POA: Diagnosis not present

## 2020-07-08 NOTE — Progress Notes (Signed)
Metrics: Intervention Frequency ACO  Documented Smoking Status Yearly  Screened one or more times in 24 months  Cessation Counseling or  Active cessation medication Past 24 months  Past 24 months   Guideline developer: UpToDate (See UpToDate for funding source) Date Released: 2014       Wellness Office Visit  Subjective:  Patient ID: Bethany Wang, female    DOB: [birth year]  Age: 55 y.o. MRN: [id number]  CC: Nose bleeding HPI  This patient comes in for an acute visit with the above symptoms which on closer questioning she has had for almost 1 month.  It has been intermittent but seems to be getting worse.  It appears to be coming from both nostrils. She also describes pain/discomfort in the right jaw area.  She is not able to open it wide. In the past, she has seen Dr. Benjamine Mola, ENT. Past Medical History:  Diagnosis Date  . Anemia   . Anxiety   . Bilateral breast cysts 12/30/2015  . Brain tumor ([place])   . Breast cancer ([place])    Left Breast Cancer  . Breast disorder   . BV (bacterial vaginosis) 09/09/2015  . Coronary atherosclerosis of native coronary artery    a. s/p DES to RCA in 2013 b. DES to LAD in 2014 c. cath in 04/2016 showing patent LAD stent with D2 jailed by LAD stent and CTO of RCA with left to right collaterals present  . Cyst of pharynx or nasopharynx    [name]  . Decreased libido 12/25/2018  . Depression   . Diabetes mellitus without complication ([place])   . Essential hypertension   . Falls    x 3-4 in past year  . GERD (gastroesophageal reflux disease)   . Headache   . History of hematuria   . Mixed hyperlipidemia   . Personal history of chemotherapy    Left Breast Cancer  . Personal history of radiation therapy    Left Breast Cancer  . Pre-diabetes   . PUD (peptic ulcer disease)   . Seizures ([place])   . Shingles 09/09/2015  . Stroke ([place])    12-2017 on Plavix, only deficit is headaches  . Suicide attempt ([place])    3 attempts in remote  past  . Trichimoniasis 08/20/2019   Treated 08/20/19   . Uterine cancer ([place])   . Vitamin D deficiency disease 04/03/2019   Past Surgical History:  Procedure Laterality Date  . ABDOMINAL HYSTERECTOMY    . BIOPSY  11/28/2019   Procedure: BIOPSY;  Surgeon: Rogene Houston, MD;  [facility];  Service: Endoscopy;;  antral, gastric body   . BLADDER SURGERY    . BREAST LUMPECTOMY Left   . BREAST LUMPECTOMY Right 02/24/2018   Procedure: RIGHT BREAST LUMPECTOMY ERAS PATHWAY;  Surgeon: Jovita Kussmaul, MD;  Location: [facility];  Service: General;  Laterality: Right;  . CARDIAC CATHETERIZATION N/A 05/10/2016   Procedure: Left Heart Cath and Coronary Angiography;  Surgeon: Belva Crome, MD;  Location: [facility];  Service: Cardiovascular;  Laterality: N/A;  . COLONOSCOPY  May 2010   [place]le[place]man: normal rectum, internal hemorrhoids, , benign colonic polyp  . COLONOSCOPY N/A 01/05/2017   Procedure: COLONOSCOPY;  Surgeon: Rogene Houston, MD;  [facility];  Service: Endoscopy;  Laterality: N/A;  1:00  . ESOPHAGEAL DILATION  11/28/2019   Procedure: ESOPHAGEAL DILATION;  Surgeon: Rogene Houston, MD;  [facility];  Service: Endoscopy;;  .  ESOPHAGOGASTRODUODENOSCOPY     2001 Dr. Amedeo Plenty: distal esophagitis, small hiatal hernia,. Dr. Tamala Julian 2006? no records available currently, pt also reports  EGD a few years ago with Dr. Gala Romney, do not have these reports anywhere in medical records  . ESOPHAGOGASTRODUODENOSCOPY  06/02/2011   [id number] pill impaction as described above s/p dilation of a probable cervical esophageal web/bx abnormal esophageal and gastric mucosa. + H.pylori gastritis   . ESOPHAGOGASTRODUODENOSCOPY (EGD) WITH PROPOFOL N/A 11/28/2019   Procedure: ESOPHAGOGASTRODUODENOSCOPY (EGD) WITH PROPOFOL;  Surgeon: Rogene Houston, MD;  [facility];  Service: Endoscopy;  Laterality: N/A;  1020  . EYE SURGERY     Removed glass  . HAND SURGERY Right   . Left  breast lumpectomy     Benign  . LEFT HEART CATH AND CORONARY ANGIOGRAPHY N/A 10/25/2018   Procedure: LEFT HEART CATH AND CORONARY ANGIOGRAPHY;  Surgeon: Wellington Hampshire, MD;  Location: [facility];  Service: Cardiovascular;  Laterality: N/A;  . LEFT HEART CATHETERIZATION WITH CORONARY ANGIOGRAM N/A 07/03/2014   Procedure: LEFT HEART CATHETERIZATION WITH CORONARY ANGIOGRAM;  Surgeon: Belva Crome, MD;  Location: [facility];  Service: Cardiovascular;  Laterality: N/A;  . POLYPECTOMY  01/05/2017   Procedure: POLYPECTOMY;  Surgeon: Rogene Houston, MD;  [facility];  Service: Endoscopy;;  colon  . SHOULDER SURGERY Left      Family History  Problem Relation Age of Onset  . Colon cancer Paternal Grandfather   . Cancer Father   . Cancer Maternal Uncle   . Alzheimer's disease Paternal Aunt   . Cirrhosis Maternal Uncle   . Cancer Paternal Aunt        lung  . Aneurysm Mother        brain  . Heart disease Brother   . Heart attack Brother   . Mental illness Daughter   . ADD / ADHD Daughter   . Bipolar disorder Daughter   . Mental illness Son   . ADD / ADHD Son   . Bipolar disorder Son   . Mental illness Son   . ADD / ADHD Son   . Bipolar disorder Son     Social History   Social History Narrative   Patient lives at new home with husband and extended family..On disability since age 56 yrs.   Social History   Tobacco Use  . Smoking status: Former Smoker    Packs/day: 0.20    Years: 32.00    Pack years: 6.40    Types: Cigarettes, Cigars    Start date: 07/05/1983    Quit date: 04/03/2012    Years since quitting: 8.2  . Smokeless tobacco: Never Used  Substance Use Topics  . Alcohol use: No    Alcohol/week: 0.0 standard drinks    No outpatient medications have been marked as taking for the 07/08/20 encounter (Office Visit) with [name], MD.   Current Facility-Administered Medications for the 07/08/20 encounter (Office Visit) with [name],  MD  Medication  . NONFORMULARY OR COMPOUNDED ITEM 0.2 mL     Flowsheet Row Office Visit from 07/08/2020 in [facility]  PHQ-9 Total Score 0      Objective:   Today's Vitals: BP 122/82   Temp (!) 96.9 F (36.1 C) (Temporal)   Ht 5\' 4"  (1.626 m)   Wt 164 lb 9.6 oz (74.7 kg)   BMI 28.25 kg/m  Vitals with BMI 07/08/2020 07/03/2020 06/19/2020  Height 5\' 4"  5\' 4"   5\' 4"   Weight 164 lbs 10 oz 164 lbs 5 oz 162 lbs  BMI 28.24 17.49 44.96  Systolic 759 163 846  Diastolic 82 78 659  Pulse - 87 81     Physical Exam  Blood pressure remains under good control.  Examination of her nostrils does not show any obvious site of bleeding.  There is no bleeding currently.     Assessment   1. Bleeding from the nose       Tests ordered Orders Placed This Encounter  Procedures  . Ambulatory referral to ENT     Plan: 1. I will refer back to Dr. Benjamine Mola as soon as possible for evaluation.   No orders of the defined types were placed in this encounter.   [name], MD

## 2020-07-13 HISTORY — PX: CAUTERIZE INNER NOSE: SHX279

## 2020-07-14 DIAGNOSIS — R040 Epistaxis: Secondary | ICD-10-CM | POA: Diagnosis not present

## 2020-07-16 ENCOUNTER — Ambulatory Visit (INDEPENDENT_AMBULATORY_CARE_PROVIDER_SITE_OTHER): Payer: Medicare Other | Admitting: Student

## 2020-07-16 ENCOUNTER — Encounter: Payer: Self-pay | Admitting: Student

## 2020-07-16 ENCOUNTER — Other Ambulatory Visit: Payer: Self-pay

## 2020-07-16 VITALS — BP 142/88 | HR 83 | Ht 64.0 in | Wt 161.0 lb

## 2020-07-16 DIAGNOSIS — R002 Palpitations: Secondary | ICD-10-CM

## 2020-07-16 DIAGNOSIS — I10 Essential (primary) hypertension: Secondary | ICD-10-CM | POA: Diagnosis not present

## 2020-07-16 DIAGNOSIS — I2510 Atherosclerotic heart disease of native coronary artery without angina pectoris: Secondary | ICD-10-CM

## 2020-07-16 DIAGNOSIS — R55 Syncope and collapse: Secondary | ICD-10-CM | POA: Diagnosis not present

## 2020-07-16 DIAGNOSIS — E785 Hyperlipidemia, unspecified: Secondary | ICD-10-CM

## 2020-07-16 MED ORDER — ASPIRIN EC 81 MG PO TBEC: 81.0000 mg | DELAYED_RELEASE_TABLET | Freq: Every day | ORAL | 1 refills | Status: DC

## 2020-07-16 MED ORDER — ATORVASTATIN CALCIUM 40 MG PO TABS: 40.0000 mg | ORAL_TABLET | Freq: Every day | ORAL | 3 refills | Status: DC

## 2020-07-16 MED ORDER — METOPROLOL TARTRATE 25 MG PO TABS: 25.0000 mg | ORAL_TABLET | Freq: Two times a day (BID) | ORAL | 3 refills | Status: DC

## 2020-07-16 NOTE — Progress Notes (Signed)
Cardiology Office Note    Date:  07/16/2020   ID:  Bethany Wang, DOB Feb 05, 1966, MRN [id number]  PCP:  [name], MD  Cardiologist: [name], MD    Chief Complaint  Patient presents with  . Follow-up    Elevated BP and palpitations    History of Present Illness:    Bethany Wang is a 55 y.o. female with past medical history of CAD (s/p DES to RCA in 2013, DES to LAD in 2014, and catheterization in 2018 showing patent LAD stent with D2 jailed by the stent, totally occluded RCA with L-->R collaterals, 75-80% small caliber OM stenosis and patent LM with medical management recommended, cath in 10/2018 with patent LAD stent and jailed D2 with overall unchanged anatomy from prior cath in 2018), HTN, HLD, palpitations, prior TIA (occurring in 12/2017), and breast cancer (s/p lumpectomy) who presents to the office today for evaluation of elevated BP and palpitations.  She was last examined by Dr. Domenic Polite in 06/2019 and reported being under increased stress at that time due to the death of several family members. She did report episodes of dysphagia but denied any specific exertional chest pain or worsening dyspnea. She was continued on her current medical therapy and was encouraged to keep scheduled follow-up with GI for further evaluation of her dysphagia.  In talking with the patient today, she reports she discontinued ALL of her medications in 04/2020. She provides several reasons for this as she was undergoing multiple GI procedures along with GU procedures giving kidney stones and she was also confused about what the medications were for. Says she was prescribed antibiotics intermittently and was informed they interacted with several medicines as well. She is open to restarting some medications but wants to limit this to just a few.   She reports overall doing well and denies any recent chest pain or dyspnea on exertion. She walks 4 miles each day and denies any anginal  symptoms with this. No recent orthopnea, PND or lower extremity edema. She does report episodes of palpitations over the past several weeks and reports a syncopal event which occurred at church. No seizure-like activity was witnessed and she did not have any prodromal symptoms. She also reports being under increased emotional stress as she is currently getting separated from her husband.    Past Medical History:  Diagnosis Date  . Anemia   . Anxiety   . Bilateral breast cysts 12/30/2015  . Brain tumor ([place])   . Breast cancer ([place])    Left Breast Cancer  . Breast disorder   . BV (bacterial vaginosis) 09/09/2015  . Coronary atherosclerosis of native coronary artery    a. s/p DES to RCA in 2013 b. DES to LAD in 2014 c. cath in 04/2016 showing patent LAD stent with D2 jailed by LAD stent and CTO of RCA with left to right collaterals present  . Cyst of pharynx or nasopharynx    [name]  . Decreased libido 12/25/2018  . Depression   . Diabetes mellitus without complication ([place])   . Essential hypertension   . Falls    x 3-4 in past year  . GERD (gastroesophageal reflux disease)   . Headache   . History of hematuria   . Mixed hyperlipidemia   . Personal history of chemotherapy    Left Breast Cancer  . Personal history of radiation therapy    Left Breast Cancer  . Pre-diabetes   . PUD (peptic ulcer  disease)   . Seizures ([place])   . Shingles 09/09/2015  . Stroke ([place])    12-2017 on Plavix, only deficit is headaches  . Suicide attempt ([place])    3 attempts in remote past  . Trichimoniasis 08/20/2019   Treated 08/20/19   . Uterine cancer ([place])   . Vitamin D deficiency disease 04/03/2019    Past Surgical History:  Procedure Laterality Date  . ABDOMINAL HYSTERECTOMY    . BIOPSY  11/28/2019   Procedure: BIOPSY;  Surgeon: Rogene Houston, MD;  [facility];  Service: Endoscopy;;  antral, gastric body   . BLADDER SURGERY    . BREAST LUMPECTOMY Left   . BREAST  LUMPECTOMY Right 02/24/2018   Procedure: RIGHT BREAST LUMPECTOMY ERAS PATHWAY;  Surgeon: Jovita Kussmaul, MD;  Location: [facility];  Service: General;  Laterality: Right;  . CARDIAC CATHETERIZATION N/A 05/10/2016   Procedure: Left Heart Cath and Coronary Angiography;  Surgeon: Belva Crome, MD;  Location: [facility];  Service: Cardiovascular;  Laterality: N/A;  . CAUTERIZE INNER NOSE  07/13/2020  . COLONOSCOPY  May 2010   [place]le[place]man: normal rectum, internal hemorrhoids, , benign colonic polyp  . COLONOSCOPY N/A 01/05/2017   Procedure: COLONOSCOPY;  Surgeon: Rogene Houston, MD;  [facility];  Service: Endoscopy;  Laterality: N/A;  1:00  . ESOPHAGEAL DILATION  11/28/2019   Procedure: ESOPHAGEAL DILATION;  Surgeon: Rogene Houston, MD;  [facility];  Service: Endoscopy;;  . ESOPHAGOGASTRODUODENOSCOPY     2001 Dr. Amedeo Plenty: distal esophagitis, small hiatal hernia,. Dr. Tamala Julian 2006? no records available currently, pt also reports  EGD a few years ago with Dr. Gala Romney, do not have these reports anywhere in medical records  . ESOPHAGOGASTRODUODENOSCOPY  06/02/2011   HAL:PFXTKW pill impaction as described above s/p dilation of a probable cervical esophageal web/bx abnormal esophageal and gastric mucosa. + H.pylori gastritis   . ESOPHAGOGASTRODUODENOSCOPY (EGD) WITH PROPOFOL N/A 11/28/2019   Procedure: ESOPHAGOGASTRODUODENOSCOPY (EGD) WITH PROPOFOL;  Surgeon: Rogene Houston, MD;  [facility];  Service: Endoscopy;  Laterality: N/A;  1020  . EYE SURGERY     Removed glass  . HAND SURGERY Right   . Left breast lumpectomy     Benign  . LEFT HEART CATH AND CORONARY ANGIOGRAPHY N/A 10/25/2018   Procedure: LEFT HEART CATH AND CORONARY ANGIOGRAPHY;  Surgeon: Wellington Hampshire, MD;  Location: [facility];  Service: Cardiovascular;  Laterality: N/A;  . LEFT HEART CATHETERIZATION WITH CORONARY ANGIOGRAM N/A 07/03/2014   Procedure: LEFT HEART CATHETERIZATION WITH CORONARY  ANGIOGRAM;  Surgeon: Belva Crome, MD;  Location: [facility];  Service: Cardiovascular;  Laterality: N/A;  . POLYPECTOMY  01/05/2017   Procedure: POLYPECTOMY;  Surgeon: Rogene Houston, MD;  [facility];  Service: Endoscopy;;  colon  . SHOULDER SURGERY Left     Current Medications: Outpatient Medications Prior to Visit  Medication Sig Dispense Refill  . albuterol (VENTOLIN HFA) 108 (90 Base) MCG/ACT inhaler Inhale 2 puffs into the lungs every 6 (six) hours as needed for wheezing or shortness of breath. (Patient not taking: No sig reported) 8 g 0  . ALPRAZolam (XANAX) 0.5 MG tablet Take 1 tablet (0.5 mg total) by mouth 2 (two) times daily as needed. (Patient not taking: No sig reported) 60 tablet 3  . Cholecalciferol (VITAMIN D3) 25 MCG (1000 UT) CHEW Chew 2,000 Units by mouth daily. (Patient not taking: No sig reported) 180  tablet 1  . escitalopram (LEXAPRO) 20 MG tablet TAKE 1 TABLET BY MOUTH DAILY (Patient not taking: No sig reported) 30 tablet 3  . fluticasone (FLONASE) 50 MCG/ACT nasal spray Place 1 spray into both nostrils daily. (Patient not taking: No sig reported) 9.9 mL 0  . Fluticasone-Salmeterol (ADVAIR) 100-50 MCG/DOSE AEPB INHALE 1 PUFF BY MOUTH TWICE DAILY (Patient not taking: No sig reported) 60 each 3  . Light Mineral Oil-Mineral Oil (RETAINE MGD OP) Apply 1 application to eye daily. Right Eye (Patient not taking: No sig reported)    . LINZESS 145 MCG CAPS capsule TAKE 1 CAPSULE BY MOUTH DAILY (Patient not taking: No sig reported) 30 capsule 3  . Medical ID Bracelet MISC 1 Units by Does not apply route daily. (Patient not taking: No sig reported) 1 each o  . nitroGLYCERIN (NITROSTAT) 0.4 MG SL tablet DISSOLVE 1 TABLET UNDER THE TONGUE AS NEEDED FOR CHEST PAIN EVERY 5 MINUTES UP TO 3 TIMES. IF NO RELIEF CALL 911. (Patient not taking: No sig reported) 25 tablet 1  . pantoprazole (PROTONIX) 40 MG tablet Take 1 tablet (40 mg total) by mouth 2 (two) times daily before a  meal. (Patient not taking: No sig reported) 180 tablet 1  . potassium chloride 20 MEQ/15ML (10%) SOLN Take 15 mLs (20 mEq total) by mouth daily. (Patient not taking: No sig reported) 1350 mL 3  . prednisoLONE acetate (PRED FORTE) 1 % ophthalmic suspension Place 1 drop into both eyes at bedtime.  (Patient not taking: No sig reported)    . predniSONE (DELTASONE) 20 MG tablet Take 2 tablets (40 mg total) by mouth daily with breakfast. (Patient not taking: No sig reported) 10 tablet 0  . tamsulosin (FLOMAX) 0.4 MG CAPS capsule Take 1 capsule (0.4 mg total) by mouth daily. (Patient not taking: No sig reported) 30 capsule 0  . thyroid (NP THYROID) 120 MG tablet Take 1 tablet (120 mg total) by mouth daily before breakfast. (Patient not taking: No sig reported) 90 tablet 1  . traMADol (ULTRAM) 50 MG tablet Take 1 tablet (50 mg total) by mouth every 12 (twelve) hours as needed. (Patient not taking: No sig reported) 20 tablet 0  . triamcinolone (KENALOG) 0.1 % Apply 1 application topically 2 (two) times daily. (Patient not taking: No sig reported) 30 g 0  . amLODipine (NORVASC) 10 MG tablet TAKE 1 TABLET BY MOUTH DAILY (Patient not taking: No sig reported) 30 tablet 3  . aspirin EC 81 MG tablet Take 2 tablets (162 mg total) by mouth 2 (two) times daily. (Patient not taking: No sig reported) 180 tablet 1  . atorvastatin (LIPITOR) 40 MG tablet Take 1 tablet (40 mg total) by mouth daily at 6 PM. (Patient not taking: No sig reported) 90 tablet 3  . clopidogrel (PLAVIX) 75 MG tablet Take 1 tablet (75 mg total) by mouth daily. (Patient not taking: No sig reported) 90 tablet 3  . isosorbide mononitrate (IMDUR) 30 MG 24 hr tablet Take 3 tablets (90 mg total) by mouth daily. (Patient not taking: No sig reported) 270 tablet 3  . losartan (COZAAR) 100 MG tablet Take 1 tablet (100 mg total) by mouth daily. (Patient not taking: No sig reported) 90 tablet 3  . metoprolol tartrate (LOPRESSOR) 50 MG tablet Take 1.5 tablets (75  mg total) by mouth 2 (two) times daily. (Patient not taking: No sig reported) 270 tablet 3   Facility-Administered Medications Prior to Visit  Medication Dose Route Frequency Provider Last  Rate Last Admin  . NONFORMULARY OR COMPOUNDED ITEM 0.2 mL  0.2 mL Intramuscular Q7 days [name], Nimish C, MD   0.2 mL at 10/23/19 1002     Allergies:   Bee venom, Penicillins, Shellfish allergy, Tomato, and Zofran [ondansetron]   Social History   Socioeconomic History  . Marital status: Married    Spouse name: Saralyn Pilar  . Number of children: 3  . Years of education: Assoc  . Highest education level: Not on file  Occupational History  . Occupation: Retired    Comment: disability  Tobacco Use  . Smoking status: Former Smoker    Packs/day: 0.20    Years: 32.00    Pack years: 6.40    Types: Cigarettes, Cigars    Start date: 07/05/1983    Quit date: 04/03/2012    Years since quitting: 8.2  . Smokeless tobacco: Never Used  Vaping Use  . Vaping Use: Never used  Substance and Sexual Activity  . Alcohol use: No    Alcohol/week: 0.0 standard drinks  . Drug use: No  . Sexual activity: Yes    Birth control/protection: Surgical    Comment: hyst  Other Topics Concern  . Not on file  Social History Narrative   Patient lives at new home with husband and extended family..On disability since age 26 yrs.   Social Determinants of Health   Financial Resource Strain: Low Risk   . Difficulty of Paying Living Expenses: Not hard at all  Food Insecurity: No Food Insecurity  . Worried About Charity fundraiser in the Last Year: Never true  . Ran Out of Food in the Last Year: Never true  Transportation Needs: No Transportation Needs  . Lack of Transportation (Medical): No  . Lack of Transportation (Non-Medical): No  Physical Activity: Sufficiently Active  . Days of Exercise per Week: 7 days  . Minutes of Exercise per Session: 60 min  Stress: Stress Concern Present  . Feeling of Stress : To some extent   Social Connections: Moderately Integrated  . Frequency of Communication with Friends and Family: Once a week  . Frequency of Social Gatherings with Friends and Family: Three times a week  . Attends Religious Services: More than 4 times per year  . Active Member of Clubs or Organizations: No  . Attends Archivist Meetings: Never  . Marital Status: Married     Family History:  The patient's family history includes ADD / ADHD in her daughter, son, and son; Alzheimer's disease in her paternal aunt; Aneurysm in her mother; Bipolar disorder in her daughter, son, and son; Cancer in her father, maternal uncle, and paternal aunt; Cirrhosis in her maternal uncle; Colon cancer in her paternal grandfather; Heart attack in her brother; Heart disease in her brother; Mental illness in her daughter, son, and son.   Review of Systems:   Please see the history of present illness.     General:  No chills, fever, night sweats or weight changes.  Cardiovascular:  No chest pain, dyspnea on exertion, edema, orthopnea,  paroxysmal nocturnal dyspnea. Positive for palpitations and syncope.  Dermatological: No rash, lesions/masses Respiratory: No cough, dyspnea Urologic: No hematuria, dysuria Abdominal:   No nausea, vomiting, diarrhea, bright red blood per rectum, melena, or hematemesis Neurologic:  No visual changes, wkns, changes in mental status. All other systems reviewed and are otherwise negative except as noted above.   Physical Exam:    VS:  BP (!) 142/88   Pulse 83  Ht 5\' 4"  (1.626 m)   Wt 161 lb (73 kg)   SpO2 97%   BMI 27.64 kg/m    General: Well developed, well nourished,female appearing in no acute distress. Head: Normocephalic, atraumatic. Neck: No carotid bruits. JVD not elevated.  Lungs: Respirations regular and unlabored, without wheezes or rales.  Heart: Regular rate and rhythm. No S3 or S4.  No murmur, no rubs, or gallops appreciated. Abdomen: Appears non-distended. No  obvious abdominal masses. Msk:  Strength and tone appear normal for age. No obvious joint deformities or effusions. Extremities: No clubbing or cyanosis. No lower extremity edema.  Distal pedal pulses are 2+ bilaterally. Neuro: Alert and oriented X 3. Moves all extremities spontaneously. No focal deficits noted. Psych:  Responds to questions appropriately with a normal affect. Skin: No rashes or lesions noted  Wt Readings from Last 3 Encounters:  07/16/20 161 lb (73 kg)  07/08/20 164 lb 9.6 oz (74.7 kg)  07/03/20 164 lb 4.8 oz (74.5 kg)     Studies/Labs Reviewed:   EKG:  EKG is ordered today. The EKG ordered today demonstrates NSR, HR 86 with LVH and TWI along Lead III. Overall similar to prior tracings.   Recent Labs: 05/15/2020: Hemoglobin 12.9; Platelets 244 07/03/2020: ALT 15; BUN 11; Creat 1.01; Potassium 3.7; Sodium 142; TSH 1.84   Lipid Panel    Component Value Date/Time   CHOL 230 (H) 07/03/2020 1334   TRIG 175 (H) 07/03/2020 1334   HDL 53 07/03/2020 1334   CHOLHDL 4.3 07/03/2020 1334   VLDL 14 10/26/2018 0415   LDLCALC 146 (H) 07/03/2020 1334    Additional studies/ records that were reviewed today include:   Echocardiogram: 10/2018 IMPRESSIONS    1. The left ventricle has normal systolic function with an ejection  fraction of 60-65%. The cavity size was normal. There is moderate  concentric left ventricular hypertrophy. Left ventricular diastolic  Doppler parameters are consistent with  pseudonormalization. No evidence of left ventricular regional wall motion  abnormalities.  2. The right ventricle has normal systolic function. The cavity was  normal. There is mildly increased right ventricular wall thickness.  3. The mitral valve is grossly normal.  4. The tricuspid valve is grossly normal.  5. The aortic valve is tricuspid. Aortic valve regurgitation is moderate  by color flow Doppler.    Cardiac Catheterization: 10/2018  Dist LAD lesion is 50%  stenosed.  Ost 2nd Diag lesion is 75% stenosed.  1st Mrg lesion is 60% stenosed.  Prox Cx to Dist Cx lesion is 30% stenosed.  2nd Mrg lesion is 20% stenosed.  Prox RCA to Mid RCA lesion is 100% stenosed.  Mid LAD-1 lesion is 20% stenosed.  Mid LAD-2 lesion is 30% stenosed.  Prox LAD lesion is 30% stenosed.  2nd Diag lesion is 70% stenosed.   1.  Significant underlying two-vessel coronary artery disease with widely patent LAD stent with minimal restenosis.  Chronically occluded RCA stent with well-developed left-to-right collaterals.  Jailed second diagonal has a stable 70% ostial stenosis.  Moderate OM1 disease. 2.  Left ventricular angiography was not performed.  EF is normal by echo. 3.  Normal left ventricular end-diastolic pressure at 11 mmHg.  Recommendations: I do not see a culprit for the patient's presumed unstable angina.  Her coronary anatomy is unchanged from 2018 and if anything, the disease in second diagonal and OM1 appear improved in appearance. I recommend continuing aggressive medical therapy.   Assessment:    1. Coronary artery disease involving  native coronary artery of native heart without angina pectoris   2. Essential hypertension, benign   3. Hyperlipidemia LDL goal <70   4. Palpitations   5. Syncope, unspecified syncope type      Plan:   In order of problems listed above:  1. CAD - She is s/p DES to RCA in 2013, DES to LAD in 2014, and catheterization in 2018 showing patent LAD stent with D2 jailed by the stent, totally occluded RCA with L-->R collaterals, 75-80% small caliber OM stenosis and patent LM with medical management recommended with most recent cath in 10/2018 with patent LAD stent and jailed D2 with overall unchanged anatomy from prior cath in 2018. - She walks for over 4 miles each day and denies any chest pain or dyspnea on exertion with these activities. - Given that she self discontinued all of her medications approximately 4  months ago, we had a conversation about her cardiac medications and she is open to restarting some of these.  Therefore, I recommended that she restart ASA 81 mg daily, Atorvastatin 40 mg daily and Lopressor but at a lower dose of 25 mg twice daily (previously on 75mg  BID) given that she has not taken this for months. Did not restart Imdur given no recent chest pain and did not restart Plavix at this time given no recent stenting and her current issues with epistaxis.   2. HTN - BP was initially recorded at 162/100 but improved to 142/88 on recheck. She did report having a verbal argument with her husband prior to her visit today and reports her readings have overall been well controlled when checked at prior office visits. Will restart Lopressor at 25 mg twice daily as outlined above. She was previously on Amlodipine 10mg  daily and Losartan 100mg  daily but has not taken in 4+ months.   3. HLD - Recent FLP in 06/2020 showed total cholesterol 230, HDL 53, triglycerides 175 and LDL 146.  We reviewed the indications for goal LDL of less than 70 given her history of CAD and prior TIA's. She is in agreement to restarting Atorvastatin 40 mg daily.  4. Palpitations/Syncope - She does report intermittent palpitations over the past month and reports a syncopal episode while at church. Reviewed driving restrictions given a syncopal episode but she reports she does not drive. Reviewed options in regards to an event monitor versus reinitiation of Lopressor and she wishes to restart Lopressor at this time and see how symptoms progress with this. I encouraged her to reach out if she has any recurrent syncopal episodes as an event monitor can be placed.    Medication Adjustments/Labs and Tests Ordered: Current medicines are reviewed at length with the patient today.  Concerns regarding medicines are outlined above.  Medication changes, Labs and Tests ordered today are listed in the Patient Instructions  below. Patient Instructions  Medication Instructions:  Your physician has recommended you make the following change in your medication:   Restart Aspirin 81 mg Daily  Restart Atorvastatin 40 mg Daily  Restart Lopressor 25 mg Two Times Daily   *If you need a refill on your cardiac medications before your next appointment, please call your pharmacy*   Lab Work: NONE   If you have labs (blood work) drawn today and your tests are completely normal, you will receive your results only by: [name] MyChart Message (if you have MyChart) OR . A paper copy in the mail If you have any lab test that is abnormal or we  need to change your treatment, we will call you to review the results.   Testing/Procedures: NONE    Follow-Up: At [facility], you and your health needs are our priority.  As part of our continuing mission to provide you with exceptional heart care, we have created designated Provider Care Teams.  These Care Teams include your primary Cardiologist (physician) and Advanced Practice Providers (APPs -  Physician Assistants and Nurse Practitioners) who all work together to provide you with the care you need, when you need it.  We recommend signing up for the patient portal called "MyChart".  Sign up information is provided on this After Visit Summary.  MyChart is used to connect with patients for Virtual Visits (Telemedicine).  Patients are able to view lab/test results, encounter notes, upcoming appointments, etc.  Non-urgent messages can be sent to your provider as well.   To learn more about what you can do with MyChart, go to [email or web address].    Your next appointment:   6 month(s)  The format for your next appointment:   In Person  Provider:   [name], MD   Other Instructions Thank you for choosing [facility]!      Signed, Erma Heritage, PA-C  07/16/2020 2:00 PM    [facility] [address] [place], [place]  [address] Phone: [phone] Fax: [phone]

## 2020-07-16 NOTE — Patient Instructions (Signed)
Medication Instructions:  Your physician has recommended you make the following change in your medication:   Restart Aspirin 81 mg Daily  Restart Atorvastatin 40 mg Daily  Restart Lopressor 25 mg Two Times Daily   *If you need a refill on your cardiac medications before your next appointment, please call your pharmacy*   Lab Work: NONE   If you have labs (blood work) drawn today and your tests are completely normal, you will receive your results only by: [name] MyChart Message (if you have MyChart) OR . A paper copy in the mail If you have any lab test that is abnormal or we need to change your treatment, we will call you to review the results.   Testing/Procedures: NONE    Follow-Up: At [facility], you and your health needs are our priority.  As part of our continuing mission to provide you with exceptional heart care, we have created designated Provider Care Teams.  These Care Teams include your primary Cardiologist (physician) and Advanced Practice Providers (APPs -  Physician Assistants and Nurse Practitioners) who all work together to provide you with the care you need, when you need it.  We recommend signing up for the patient portal called "MyChart".  Sign up information is provided on this After Visit Summary.  MyChart is used to connect with patients for Virtual Visits (Telemedicine).  Patients are able to view lab/test results, encounter notes, upcoming appointments, etc.  Non-urgent messages can be sent to your provider as well.   To learn more about what you can do with MyChart, go to [email or web address].    Your next appointment:   6 month(s)  The format for your next appointment:   In Person  Provider:   [name], MD   Other Instructions Thank you for choosing [facility]!

## 2020-07-17 ENCOUNTER — Ambulatory Visit (HOSPITAL_COMMUNITY)
Admission: RE | Admit: 2020-07-17 | Discharge: 2020-07-17 | Disposition: A | Discharge status: Home / Self Care | Payer: Medicare Other | Source: Ambulatory Visit | Attending: General Surgery | Admitting: General Surgery

## 2020-07-17 DIAGNOSIS — R1084 Generalized abdominal pain: Secondary | ICD-10-CM | POA: Diagnosis not present

## 2020-07-17 DIAGNOSIS — C50919 Malignant neoplasm of unspecified site of unspecified female breast: Secondary | ICD-10-CM | POA: Diagnosis not present

## 2020-07-17 DIAGNOSIS — R109 Unspecified abdominal pain: Secondary | ICD-10-CM | POA: Diagnosis not present

## 2020-07-17 MED ORDER — IOHEXOL 350 MG/ML SOLN
100.0000 mL | Freq: Once | INTRAVENOUS | Status: AC | PRN
  Administered 2020-07-17: 100 mL via INTRAVENOUS

## 2020-07-22 DIAGNOSIS — E119 Type 2 diabetes mellitus without complications: Secondary | ICD-10-CM | POA: Diagnosis not present

## 2020-07-30 ENCOUNTER — Other Ambulatory Visit (INDEPENDENT_AMBULATORY_CARE_PROVIDER_SITE_OTHER): Payer: Self-pay | Admitting: Internal Medicine

## 2020-08-07 ENCOUNTER — Telehealth (INDEPENDENT_AMBULATORY_CARE_PROVIDER_SITE_OTHER): Payer: Self-pay | Admitting: Internal Medicine

## 2020-08-07 NOTE — Telephone Encounter (Signed)
Left message for patient to call back and schedule Medicare Annual Wellness Visit (AWV) either virtually or in office.   awvi 04/12/09 per [facility]   please schedule at anytime with [facility]  health coach  This should be a 40 minute visit.

## 2020-08-11 ENCOUNTER — Telehealth: Payer: Self-pay | Admitting: Student

## 2020-08-11 ENCOUNTER — Other Ambulatory Visit: Payer: Self-pay

## 2020-08-11 ENCOUNTER — Ambulatory Visit (INDEPENDENT_AMBULATORY_CARE_PROVIDER_SITE_OTHER): Payer: Medicare Other | Admitting: Internal Medicine

## 2020-08-11 ENCOUNTER — Ambulatory Visit (HOSPITAL_COMMUNITY)
Admission: RE | Admit: 2020-08-11 | Discharge: 2020-08-11 | Disposition: A | Discharge status: Home / Self Care | Payer: Medicare Other | Source: Ambulatory Visit | Attending: Internal Medicine | Admitting: Internal Medicine

## 2020-08-11 ENCOUNTER — Encounter (INDEPENDENT_AMBULATORY_CARE_PROVIDER_SITE_OTHER): Payer: Self-pay | Admitting: Internal Medicine

## 2020-08-11 VITALS — BP 148/100 | HR 77 | Temp 97.3°F | Ht 64.0 in | Wt 160.8 lb

## 2020-08-11 DIAGNOSIS — R55 Syncope and collapse: Secondary | ICD-10-CM | POA: Diagnosis not present

## 2020-08-11 DIAGNOSIS — M79671 Pain in right foot: Secondary | ICD-10-CM

## 2020-08-11 DIAGNOSIS — S99921A Unspecified injury of right foot, initial encounter: Secondary | ICD-10-CM | POA: Diagnosis not present

## 2020-08-11 DIAGNOSIS — M2011 Hallux valgus (acquired), right foot: Secondary | ICD-10-CM | POA: Diagnosis not present

## 2020-08-11 DIAGNOSIS — M7989 Other specified soft tissue disorders: Secondary | ICD-10-CM | POA: Diagnosis not present

## 2020-08-11 DIAGNOSIS — M19071 Primary osteoarthritis, right ankle and foot: Secondary | ICD-10-CM | POA: Diagnosis not present

## 2020-08-11 NOTE — Progress Notes (Signed)
Metrics: Intervention Frequency ACO  Documented Smoking Status Yearly  Screened one or more times in 24 months  Cessation Counseling or  Active cessation medication Past 24 months  Past 24 months   Guideline developer: UpToDate (See UpToDate for funding source) Date Released: 2014       Wellness Office Visit  Subjective:  Patient ID: Bethany Wang, female    DOB: 1966-02-20  Age: 55 y.o. MRN: [id number]  CC: Right foot pain. Syncopal episode. HPI  This lady comes in because she had an accidental fall 4 days ago and her right foot is hurting her tremendously and although she can walk, it is very painful.  Intermittently she has swelling of the right foot also.  She wonders if she has had a fracture. Also, yesterday, despite taking her metoprolol in the morning, she had another syncopal episode which was preceded by rapid palpitations.  We think that the syncopal episode lasted for 15 minutes.  She made a full recovery.  She has seen cardiology for this and discussed regarding whether she should have another event monitor. Past Medical History:  Diagnosis Date  . Anemia   . Anxiety   . Bilateral breast cysts 12/30/2015  . Brain tumor ([place])   . Breast cancer ([place])    Left Breast Cancer  . Breast disorder   . BV (bacterial vaginosis) 09/09/2015  . Coronary atherosclerosis of native coronary artery    a. s/p DES to RCA in 2013 b. DES to LAD in 2014 c. cath in 04/2016 showing patent LAD stent with D2 jailed by LAD stent and CTO of RCA with left to right collaterals present  . Cyst of pharynx or nasopharynx    [name]  . Decreased libido 12/25/2018  . Depression   . Diabetes mellitus without complication ([place])   . Essential hypertension   . Falls    x 3-4 in past year  . GERD (gastroesophageal reflux disease)   . Headache   . History of hematuria   . Mixed hyperlipidemia   . Personal history of chemotherapy    Left Breast Cancer  . Personal history of  radiation therapy    Left Breast Cancer  . Pre-diabetes   . PUD (peptic ulcer disease)   . Seizures ([place])   . Shingles 09/09/2015  . Stroke ([place])    12-2017 on Plavix, only deficit is headaches  . Suicide attempt ([place])    3 attempts in remote past  . Trichimoniasis 08/20/2019   Treated 08/20/19   . Uterine cancer ([place])   . Vitamin D deficiency disease 04/03/2019   Past Surgical History:  Procedure Laterality Date  . ABDOMINAL HYSTERECTOMY    . BIOPSY  11/28/2019   Procedure: BIOPSY;  Surgeon: Rogene Houston, MD;  [facility];  Service: Endoscopy;;  antral, gastric body   . BLADDER SURGERY    . BREAST LUMPECTOMY Left   . BREAST LUMPECTOMY Right 02/24/2018   Procedure: RIGHT BREAST LUMPECTOMY ERAS PATHWAY;  Surgeon: Jovita Kussmaul, MD;  Location: [place];  Service: General;  Laterality: Right;  . CARDIAC CATHETERIZATION N/A 05/10/2016   Procedure: Left Heart Cath and Coronary Angiography;  Surgeon: Belva Crome, MD;  Location: [place];  Service: Cardiovascular;  Laterality: N/A;  . CAUTERIZE INNER NOSE  07/13/2020  . COLONOSCOPY  May 2010   Fleishman: normal rectum, internal hemorrhoids, , benign colonic polyp  . COLONOSCOPY N/A 01/05/2017   Procedure: COLONOSCOPY;  Surgeon: Laural Golden,  Mechele Dawley, MD;  [facility];  Service: Endoscopy;  Laterality: N/A;  1:00  . ESOPHAGEAL DILATION  11/28/2019   Procedure: ESOPHAGEAL DILATION;  Surgeon: Rogene Houston, MD;  [facility];  Service: Endoscopy;;  . ESOPHAGOGASTRODUODENOSCOPY     2001 Dr. Amedeo Plenty: distal esophagitis, small hiatal hernia,. Dr. Tamala Julian 2006? no records available currently, pt also reports  EGD a few years ago with Dr. Gala Romney, do not have these reports anywhere in medical records  . ESOPHAGOGASTRODUODENOSCOPY  06/02/2011   [id number] pill impaction as described above s/p dilation of a probable cervical esophageal web/bx abnormal esophageal and gastric mucosa. + H.pylori gastritis   .  ESOPHAGOGASTRODUODENOSCOPY (EGD) WITH PROPOFOL N/A 11/28/2019   Procedure: ESOPHAGOGASTRODUODENOSCOPY (EGD) WITH PROPOFOL;  Surgeon: Rogene Houston, MD;  [facility];  Service: Endoscopy;  Laterality: N/A;  1020  . EYE SURGERY     Removed glass  . HAND SURGERY Right   . Left breast lumpectomy     Benign  . LEFT HEART CATH AND CORONARY ANGIOGRAPHY N/A 10/25/2018   Procedure: LEFT HEART CATH AND CORONARY ANGIOGRAPHY;  Surgeon: Wellington Hampshire, MD;  Location: [facility];  Service: Cardiovascular;  Laterality: N/A;  . LEFT HEART CATHETERIZATION WITH CORONARY ANGIOGRAM N/A 07/03/2014   Procedure: LEFT HEART CATHETERIZATION WITH CORONARY ANGIOGRAM;  Surgeon: Belva Crome, MD;  Location: [facility];  Service: Cardiovascular;  Laterality: N/A;  . POLYPECTOMY  01/05/2017   Procedure: POLYPECTOMY;  Surgeon: Rogene Houston, MD;  [facility];  Service: Endoscopy;;  colon  . SHOULDER SURGERY Left      Family History  Problem Relation Age of Onset  . Colon cancer Paternal Grandfather   . Cancer Father   . Cancer Maternal Uncle   . Alzheimer's disease Paternal Aunt   . Cirrhosis Maternal Uncle   . Cancer Paternal Aunt        lung  . Aneurysm Mother        brain  . Heart disease Brother   . Heart attack Brother   . Mental illness Daughter   . ADD / ADHD Daughter   . Bipolar disorder Daughter   . Mental illness Son   . ADD / ADHD Son   . Bipolar disorder Son   . Mental illness Son   . ADD / ADHD Son   . Bipolar disorder Son     Social History   Social History Narrative   Patient lives at new home with husband and extended family..On disability since age 27 yrs.   Social History   Tobacco Use  . Smoking status: Former Smoker    Packs/day: 0.20    Years: 32.00    Pack years: 6.40    Types: Cigarettes, Cigars    Start date: 07/05/1983    Quit date: 04/03/2012    Years since quitting: 8.3  . Smokeless tobacco: Never Used  Substance Use Topics   . Alcohol use: No    Alcohol/week: 0.0 standard drinks    Current Meds  Medication Sig  . aspirin EC 81 MG tablet Take 1 tablet (81 mg total) by mouth daily.  [name] atorvastatin (LIPITOR) 40 MG tablet Take 1 tablet (40 mg total) by mouth daily at 6 PM.  . metoprolol tartrate (LOPRESSOR) 25 MG tablet Take 1 tablet (25 mg total) by mouth 2 (two) times daily.  . pantoprazole (PROTONIX) 40 MG tablet TAKE ONE (1) TABLET BY MOUTH TWICE DAILY BEFORE  A MEAL   Current Facility-Administered Medications for the 08/11/20 encounter (Office Visit) with [name], MD  Medication  . NONFORMULARY OR COMPOUNDED ITEM 0.2 mL     Flowsheet Row Office Visit from 07/08/2020 in [facility]  PHQ-9 Total Score 0      Objective:   Today's Vitals: BP (!) 148/100   Pulse 77   Temp (!) 97.3 F (36.3 C) (Temporal)   Ht 5\' 4"  (1.626 m)   Wt 160 lb 12.8 oz (72.9 kg)   SpO2 99%   BMI 27.60 kg/m  Vitals with BMI 08/11/2020 07/16/2020 07/16/2020  Height 5\' 4"  - 5\' 4"   Weight 160 lbs 13 oz - 161 lbs  BMI 54.00 - 86.76  Systolic 195 093 267  Diastolic 124 88 580  Pulse 77 - 83     Physical Exam  She looks systemically well.  Blood pressure elevated today but I think she is anxious and in pain with her right foot.  Acute tenderness in the right plantar aspect of the sole of the foot.  No obvious swelling.  She appears to be alert and oriented at the present time.     Assessment   1. Syncope, unspecified syncope type   2. Right foot pain       Tests ordered Orders Placed This Encounter  Procedures  . DG Foot Complete Right     Plan: 1. I will send her for foot x-ray to evaluate the right foot pain. 2. I will communicate with her cardiologist regarding the syncopal episode and I think she likely warrants another event monitor. 3. Follow-up as scheduled.   No orders of the defined types were placed in this encounter.   [name], MD

## 2020-08-11 NOTE — Telephone Encounter (Signed)
New message    Patient stopped by the office today , she has had 3 episodes where she collapsed , she has episodes of really fast heart rates and then she collapse , she was out for about 15 minutes before she came back around. Dr Lavonne Chick told her to come see you about a monitor

## 2020-08-11 NOTE — Telephone Encounter (Signed)
Patient returning call to Kisha 

## 2020-08-11 NOTE — Telephone Encounter (Signed)
Returned call to pt. No answer. Left msg to call back.  

## 2020-08-12 ENCOUNTER — Other Ambulatory Visit (INDEPENDENT_AMBULATORY_CARE_PROVIDER_SITE_OTHER): Payer: Self-pay | Admitting: Internal Medicine

## 2020-08-12 DIAGNOSIS — M79671 Pain in right foot: Secondary | ICD-10-CM

## 2020-08-12 NOTE — Progress Notes (Signed)
Thank you :)

## 2020-08-12 NOTE — Progress Notes (Signed)
Yes, she said send her to [place], not [place] office staff.

## 2020-08-12 NOTE — Progress Notes (Signed)
Let the patient know that the foot x-ray did not show any evidence of fracture.  If she would like to be referred to orthopedics, I can do that.  Let me know.

## 2020-08-15 DIAGNOSIS — J310 Chronic rhinitis: Secondary | ICD-10-CM | POA: Diagnosis not present

## 2020-08-15 DIAGNOSIS — R040 Epistaxis: Secondary | ICD-10-CM | POA: Diagnosis not present

## 2020-08-15 NOTE — Progress Notes (Signed)
Subjective:   Bethany Wang is a 55 y.o. female who presents for an Initial Medicare Annual Wellness Visit.  Review of Systems    N/A  Cardiac Risk Factors include: hypertension;dyslipidemia     Objective:    Today's Vitals   08/18/20 1108 08/18/20 1113  BP: 132/78   Pulse: 67   Temp: 97.6 F (36.4 C)   TempSrc: Temporal   SpO2: 97%   Weight: 161 lb 6 oz (73.2 kg)   Height: 5\' 4"  (1.626 m)   PainSc:  9    Body mass index is 27.7 kg/m.  Advanced Directives 08/18/2020 05/15/2020 04/07/2020 01/23/2020 01/23/2020 12/25/2019 11/28/2019  Does Patient Have a Medical Advance Directive? Yes Yes No Yes No No No  Type of Paramedic of [place];Living will Living will - [place];Living will - - -  Does patient want to make changes to medical advance directive? No - Patient declined - - - - - -  Copy of [place] in Chart? No - copy requested - - - - - -  Would patient like information on creating a medical advance directive? - - - - - - No - Patient declined    Current Medications (verified) Outpatient Encounter Medications as of 08/18/2020  Medication Sig  . aspirin EC 81 MG tablet Take 1 tablet (81 mg total) by mouth daily.  [name] atorvastatin (LIPITOR) 40 MG tablet Take 1 tablet (40 mg total) by mouth daily at 6 PM.  . metoprolol tartrate (LOPRESSOR) 25 MG tablet Take 1 tablet (25 mg total) by mouth 2 (two) times daily.  . pantoprazole (PROTONIX) 40 MG tablet TAKE ONE (1) TABLET BY MOUTH TWICE DAILY BEFORE A MEAL   Facility-Administered Encounter Medications as of 08/18/2020  Medication  . NONFORMULARY OR COMPOUNDED ITEM 0.2 mL    Allergies (verified) Bee venom, Penicillins, Shellfish allergy, Tomato, and Zofran [ondansetron]   History: Past Medical History:  Diagnosis Date  . Anemia   . Anxiety   . Bilateral breast cysts 12/30/2015  . Brain tumor ([place])   . Breast cancer ([place])    Left Breast Cancer  . Breast  disorder   . BV (bacterial vaginosis) 09/09/2015  . Coronary atherosclerosis of native coronary artery    a. s/p DES to RCA in 2013 b. DES to LAD in 2014 c. cath in 04/2016 showing patent LAD stent with D2 jailed by LAD stent and CTO of RCA with left to right collaterals present  . Cyst of pharynx or nasopharynx    [name]  . Decreased libido 12/25/2018  . Depression   . Diabetes mellitus without complication ([place])   . Essential hypertension   . Falls    x 3-4 in past year  . GERD (gastroesophageal reflux disease)   . Headache   . History of hematuria   . Mixed hyperlipidemia   . Personal history of chemotherapy    Left Breast Cancer  . Personal history of radiation therapy    Left Breast Cancer  . Pre-diabetes   . PUD (peptic ulcer disease)   . Seizures ([place])   . Shingles 09/09/2015  . Stroke ([place])    12-2017 on Plavix, only deficit is headaches  . Suicide attempt ([place])    3 attempts in remote past  . Trichimoniasis 08/20/2019   Treated 08/20/19   . Uterine cancer ([place])   . Vitamin D deficiency disease 04/03/2019   Past Surgical History:  Procedure Laterality  Date  . ABDOMINAL HYSTERECTOMY    . BIOPSY  11/28/2019   Procedure: BIOPSY;  Surgeon: Rogene Houston, MD;  [facility];  Service: Endoscopy;;  antral, gastric body   . BLADDER SURGERY    . BREAST LUMPECTOMY Left   . BREAST LUMPECTOMY Right 02/24/2018   Procedure: RIGHT BREAST LUMPECTOMY ERAS PATHWAY;  Surgeon: Jovita Kussmaul, MD;  Location: [facility];  Service: General;  Laterality: Right;  . CARDIAC CATHETERIZATION N/A 05/10/2016   Procedure: Left Heart Cath and Coronary Angiography;  Surgeon: Belva Crome, MD;  Location: [facility];  Service: Cardiovascular;  Laterality: N/A;  . CAUTERIZE INNER NOSE  07/13/2020  . COLONOSCOPY  May 2010   Fleishman: normal rectum, internal hemorrhoids, , benign colonic polyp  . COLONOSCOPY N/A 01/05/2017   Procedure: COLONOSCOPY;  Surgeon: Rogene Houston, MD;  [facility];  Service: Endoscopy;  Laterality: N/A;  1:00  . ESOPHAGEAL DILATION  11/28/2019   Procedure: ESOPHAGEAL DILATION;  Surgeon: Rogene Houston, MD;  [facility];  Service: Endoscopy;;  . ESOPHAGOGASTRODUODENOSCOPY     2001 Dr. Amedeo Plenty: distal esophagitis, small hiatal hernia,. Dr. Tamala Julian 2006? no records available currently, pt also reports  EGD a few years ago with Dr. Gala Romney, do not have these reports anywhere in medical records  . ESOPHAGOGASTRODUODENOSCOPY  06/02/2011   [id number] pill impaction as described above s/p dilation of a probable cervical esophageal web/bx abnormal esophageal and gastric mucosa. + H.pylori gastritis   . ESOPHAGOGASTRODUODENOSCOPY (EGD) WITH PROPOFOL N/A 11/28/2019   Procedure: ESOPHAGOGASTRODUODENOSCOPY (EGD) WITH PROPOFOL;  Surgeon: Rogene Houston, MD;  [facility];  Service: Endoscopy;  Laterality: N/A;  1020  . EYE SURGERY     Removed glass  . HAND SURGERY Right   . Left breast lumpectomy     Benign  . LEFT HEART CATH AND CORONARY ANGIOGRAPHY N/A 10/25/2018   Procedure: LEFT HEART CATH AND CORONARY ANGIOGRAPHY;  Surgeon: Wellington Hampshire, MD;  Location: [place] [facility];  Service: Cardiovascular;  Laterality: N/A;  . LEFT HEART CATHETERIZATION WITH CORONARY ANGIOGRAM N/A 07/03/2014   Procedure: LEFT HEART CATHETERIZATION WITH CORONARY ANGIOGRAM;  Surgeon: Belva Crome, MD;  Location: [facility];  Service: Cardiovascular;  Laterality: N/A;  . POLYPECTOMY  01/05/2017   Procedure: POLYPECTOMY;  Surgeon: Rogene Houston, MD;  [facility];  Service: Endoscopy;;  colon  . SHOULDER SURGERY Left    Family History  Problem Relation Age of Onset  . Colon cancer Paternal Grandfather   . Cancer Father   . Cancer Maternal Uncle   . Alzheimer's disease Paternal Aunt   . Cirrhosis Maternal Uncle   . Cancer Paternal Aunt        lung  . Aneurysm Mother        brain  . Heart disease Brother   .  Heart attack Brother   . Mental illness Daughter   . ADD / ADHD Daughter   . Bipolar disorder Daughter   . Mental illness Son   . ADD / ADHD Son   . Bipolar disorder Son   . Mental illness Son   . ADD / ADHD Son   . Bipolar disorder Son    Social History   Socioeconomic History  . Marital status: Married    Spouse name: Saralyn Pilar  . Number of children: 3  . Years of education: Assoc  . Highest education level: Not on file  Occupational History  . Occupation: Retired    Comment: disability  Tobacco Use  . Smoking status: Former Smoker    Packs/day: 0.20    Years: 32.00    Pack years: 6.40    Types: Cigarettes, Cigars    Start date: 07/05/1983    Quit date: 04/03/2012    Years since quitting: 8.3  . Smokeless tobacco: Never Used  Vaping Use  . Vaping Use: Never used  Substance and Sexual Activity  . Alcohol use: No    Alcohol/week: 0.0 standard drinks  . Drug use: No  . Sexual activity: Yes    Birth control/protection: Surgical    Comment: hyst  Other Topics Concern  . Not on file  Social History Narrative   Patient lives at new home with husband and extended family..On disability since age 1 yrs.   Social Determinants of Health   Financial Resource Strain: Low Risk   . Difficulty of Paying Living Expenses: Not hard at all  Food Insecurity: No Food Insecurity  . Worried About Charity fundraiser in the Last Year: Never true  . Ran Out of Food in the Last Year: Never true  Transportation Needs: No Transportation Needs  . Lack of Transportation (Medical): No  . Lack of Transportation (Non-Medical): No  Physical Activity: Inactive  . Days of Exercise per Week: 0 days  . Minutes of Exercise per Session: 0 min  Stress: No Stress Concern Present  . Feeling of Stress : Not at all  Social Connections: Socially Integrated  . Frequency of Communication with Friends and Family: More than three times a week  . Frequency of Social Gatherings with Friends and Family: More  than three times a week  . Attends Religious Services: More than 4 times per year  . Active Member of Clubs or Organizations: Yes  . Attends Archivist Meetings: More than 4 times per year  . Marital Status: Married    Tobacco Counseling Counseling given: Not Answered   Clinical Intake:  Pre-visit preparation completed: Yes  Pain : 0-10 Pain Score: 9  Pain Type: Acute pain Pain Location: Head Pain Descriptors / Indicators: Aching Pain Onset: In the past 7 days Pain Frequency: Intermittent     Nutritional Risks: Nausea/ vomitting/ diarrhea (nausea) Diabetes: No  How often do you need to have someone help you when you read instructions, pamphlets, or other written materials from your doctor or pharmacy?: 5 - Always (has difficulties seeing)  Diabetic?No   Interpreter Needed?: No  Information entered by :: [place] of Daily Living In your present state of health, do you have any difficulty performing the following activities: 08/18/2020 07/03/2020  Hearing? Y N  Vision? Y Y  Comment - for reading  Difficulty concentrating or making decisions? Y N  Comment has difficulties with remembering names, and appointments -  Walking or climbing stairs? Y N  Comment due to tachycardia -  Dressing or bathing? N N  Doing errands, shopping? Y N  Preparing Food and eating ? N -  Using the Toilet? N -  In the past six months, have you accidently leaked urine? Y -  Comment has some leakage with urgency -  Do you have problems with loss of bowel control? N -  Managing your Medications? N -  Managing your Finances? N -  Housekeeping or managing your Housekeeping? N -  Some recent data might be hidden    Patient Care Team: [name], MD  as PCP - General (Internal Medicine) Satira Sark, MD as PCP - Cardiology (Cardiology) Gala Romney Cristopher Estimable, MD as Consulting Physician (Gastroenterology)  Indicate any recent Medical Services you may have  received from other than [facility] providers in the past year (date may be approximate).     Assessment:   This is a routine wellness examination for Bethany Wang.  Hearing/Vision screen  Hearing Screening   125Hz  250Hz  500Hz  1000Hz  2000Hz  3000Hz  4000Hz  6000Hz  8000Hz   Right ear:           Left ear:           Vision Screening Comments: Patient states gets eyes examined. Currently wears glasses, Has some vision disturbances due to stroke   Dietary issues and exercise activities discussed: Current Exercise Habits: The patient does not participate in regular exercise at present  Goals Addressed            This Visit's Progress   . Patient Stated       I would like to go to [place]       Depression Screen PHQ 2/9 Scores 08/18/2020 07/08/2020 08/15/2019 07/02/2019 03/14/2017  PHQ - 2 Score 0 0 4 0 3  PHQ- 9 Score 0 0 15 - 12  Exception Documentation - - - Medical reason -    Fall Risk Fall Risk  08/18/2020 07/03/2020 06/19/2020 08/15/2019 07/02/2019  Falls in the past year? 1 1 0 1 0  Number falls in past yr: 1 1 - 1 0  Comment - 3; - - -  Injury with Fall? 1 0 - 1 0  Comment - just bruising - - -  Risk for fall due to : Impaired balance/gait;History of fall(s) History of fall(s);Impaired vision;Other (Comment) - - No Fall Risks  Risk for fall due to: Comment - due to post stoke; rt eye low vision - - -  Follow up Falls evaluation completed;Falls prevention discussed Falls evaluation completed Falls evaluation completed - Falls evaluation completed    FALL RISK PREVENTION PERTAINING TO THE HOME:  Any stairs in or around the home? No  If so, are there any without handrails? No  Home free of loose throw rugs in walkways, pet beds, electrical cords, etc? Yes  Adequate lighting in your home to reduce risk of falls? Yes   ASSISTIVE DEVICES UTILIZED TO PREVENT FALLS:  Life alert? Yes Use of a cane, walker or w/c? Yes  Grab bars in the bathroom? No  Shower chair or bench in shower? Yes  Elevated  toilet seat or a handicapped toilet? Yes   TIMED UP AND GO:  Was the test performed? Yes .  Length of time to ambulate 10 feet: 4 sec.   Gait steady and fast without use of assistive device  Cognitive Function: 6CIT Screen 08/18/2020  What Year? 0 points  What month? 0 points  What time? 0 points  Count back from 20 0 points  Months in reverse 0 points  Repeat phrase 4 points  Total Score 4    Immunizations Immunization History  Administered Date(s) Administered  . Influenza Inj Mdck Quad With Preservative 01/21/2020  . Influenza,inj,Quad PF,6+ Mos 01/29/2019  . Influenza-Unspecified 01/14/2020  . Moderna Sars-Covid-2 Vaccination 07/05/2019, 08/08/2019, 03/11/2020  . Pneumococcal Conjugate-13 01/21/2020  . Pneumococcal Polysaccharide-23 01/18/2017, 01/15/2019  . Tdap 02/16/2018  . Zoster Recombinat (Shingrix) 01/15/2019    TDAP status: Up to date  Flu Vaccine status: Up to date  Pneumococcal vaccine status: Up to date  Covid-19 vaccine status:  Completed vaccines  Qualifies for Shingles Vaccine? Yes   Zostavax completed No   Shingrix Completed?: Yes  Screening Tests Health Maintenance  Topic Date Due  . URINE MICROALBUMIN  Never done  . COVID-19 Vaccine (4 - Booster for Moderna series) 09/08/2020  . INFLUENZA VACCINE  11/10/2020  . MAMMOGRAM  03/17/2021  . COLONOSCOPY (Pts 45-26yrs Insurance coverage will need to be confirmed)  01/06/2027  . TETANUS/TDAP  07/24/2028  . Hepatitis C Screening  Completed  . HIV Screening  Completed  . HPV VACCINES  Aged Out  . PAP SMEAR-Modifier  Discontinued    Health Maintenance  Health Maintenance Due  Topic Date Due  . URINE MICROALBUMIN  Never done    Colorectal cancer screening: Type of screening: Colonoscopy. Completed 01/05/2017. Repeat every 10 years  Mammogram status: Completed 03/17/2020. Repeat every year  Bone Density Status: Not due until age 33   Lung Cancer Screening: (Low Dose CT Chest recommended if  Age 26-80 years, 30 pack-year currently smoking OR have quit w/in 15years.) does not qualify.   Lung Cancer Screening Referral: N/A   Additional Screening:  Hepatitis C Screening: does qualify; Completed 07/03/2020   Vision Screening: Recommended annual ophthalmology exams for early detection of glaucoma and other disorders of the eye. Is the patient up to date with their annual eye exam?  Yes  Who is the provider or what is the name of the office in which the patient attends annual eye exams? Dr. Jorja Loa If pt is not established with a provider, would they like to be referred to a provider to establish care? No .   Dental Screening: Recommended annual dental exams for proper oral hygiene  Community Resource Referral / Chronic Care Management: CRR required this visit?  No   CCM required this visit?  No      Plan:     I have personally reviewed and noted the following in the patient's chart:   . Medical and social history . Use of alcohol, tobacco or illicit drugs  . Current medications and supplements including opioid prescriptions. Patient is not currently taking opioid prescriptions. . Functional ability and status . Nutritional status . Physical activity . Advanced directives . List of other physicians . Hospitalizations, surgeries, and ER visits in previous 12 months . Vitals . Screenings to include cognitive, depression, and falls . Referrals and appointments  In addition, I have reviewed and discussed with patient certain preventive protocols, quality metrics, and best practice recommendations. A written personalized care plan for preventive services as well as general preventive health recommendations were provided to patient.     Ofilia Neas, LPN   11/13/6627   Nurse Notes: None

## 2020-08-18 ENCOUNTER — Other Ambulatory Visit: Payer: Self-pay

## 2020-08-18 ENCOUNTER — Ambulatory Visit (INDEPENDENT_AMBULATORY_CARE_PROVIDER_SITE_OTHER): Payer: Medicare Other

## 2020-08-18 ENCOUNTER — Encounter (INDEPENDENT_AMBULATORY_CARE_PROVIDER_SITE_OTHER): Payer: Self-pay

## 2020-08-18 VITALS — BP 132/78 | HR 67 | Temp 97.6°F | Ht 64.0 in | Wt 161.4 lb

## 2020-08-18 DIAGNOSIS — Z0000 Encounter for general adult medical examination without abnormal findings: Secondary | ICD-10-CM | POA: Diagnosis not present

## 2020-08-18 NOTE — Patient Instructions (Addendum)
[name] , Thank you for taking time to come for your Medicare Wellness Visit. I appreciate your ongoing commitment to your health goals. Please review the following plan we discussed and let me know if I can assist you in the future.   Screening recommendations/referrals: Colonoscopy: Up to date, next due 01/06/2027 Mammogram: Up to date, next due 03/17/2021 Bone Density: Not due until age 55  Recommended yearly ophthalmology/optometry visit for glaucoma screening and checkup Recommended yearly dental visit for hygiene and checkup  Vaccinations: Influenza vaccine: Up to date, next due fall 2022  Pneumococcal vaccine: Up to date, next due at age 49 Tdap vaccine: Up to date, next due 02/17/2028 Shingles vaccine: Completed series     Advanced directives: Please bring copies of your advanced medical directives so that we may scan into your chart.  Conditions/risks identified: None   Next appointment: 01/05/2021 @ 8:45 am with Dr. [name]  Preventive Care 40-64 Years, Female Preventive care refers to lifestyle choices and visits with your health care provider that can promote health and wellness. What does preventive care include?  A yearly physical exam. This is also called an annual well check.  Dental exams once or twice a year.  Routine eye exams. Ask your health care provider how often you should have your eyes checked.  Personal lifestyle choices, including:  Daily care of your teeth and gums.  Regular physical activity.  Eating a healthy diet.  Avoiding tobacco and drug use.  Limiting alcohol use.  Practicing safe sex.  Taking low-dose aspirin daily starting at age 50.  Taking vitamin and mineral supplements as recommended by your health care provider. What happens during an annual well check? The services and screenings done by your health care provider during your annual well check will depend on your age, overall health, lifestyle risk factors, and family  history of disease. Counseling  Your health care provider may ask you questions about your:  Alcohol use.  Tobacco use.  Drug use.  Emotional well-being.  Home and relationship well-being.  Sexual activity.  Eating habits.  Work and work Statistician.  Method of birth control.  Menstrual cycle.  Pregnancy history. Screening  You may have the following tests or measurements:  Height, weight, and BMI.  Blood pressure.  Lipid and cholesterol levels. These may be checked every 5 years, or more frequently if you are over 73 years old.  Skin check.  Lung cancer screening. You may have this screening every year starting at age 40 if you have a 30-pack-year history of smoking and currently smoke or have quit within the past 15 years.  Fecal occult blood test (FOBT) of the stool. You may have this test every year starting at age 63.  Flexible sigmoidoscopy or colonoscopy. You may have a sigmoidoscopy every 5 years or a colonoscopy every 10 years starting at age 4.  Hepatitis C blood test.  Hepatitis B blood test.  Sexually transmitted disease (STD) testing.  Diabetes screening. This is done by checking your blood sugar (glucose) after you have not eaten for a while (fasting). You may have this done every 1-3 years.  Mammogram. This may be done every 1-2 years. Talk to your health care provider about when you should start having regular mammograms. This may depend on whether you have a family history of breast cancer.  BRCA-related cancer screening. This may be done if you have a family history of breast, ovarian, tubal, or peritoneal cancers.  Pelvic exam and Pap test.  This may be done every 3 years starting at age 94. Starting at age 45, this may be done every 5 years if you have a Pap test in combination with an HPV test.  Bone density scan. This is done to screen for osteoporosis. You may have this scan if you are at high risk for osteoporosis. Discuss your test  results, treatment options, and if necessary, the need for more tests with your health care provider. Vaccines  Your health care provider may recommend certain vaccines, such as:  Influenza vaccine. This is recommended every year.  Tetanus, diphtheria, and acellular pertussis (Tdap, Td) vaccine. You may need a Td booster every 10 years.  Zoster vaccine. You may need this after age 24.  Pneumococcal 13-valent conjugate (PCV13) vaccine. You may need this if you have certain conditions and were not previously vaccinated.  Pneumococcal polysaccharide (PPSV23) vaccine. You may need one or two doses if you smoke cigarettes or if you have certain conditions. Talk to your health care provider about which screenings and vaccines you need and how often you need them. This information is not intended to replace advice given to you by your health care provider. Make sure you discuss any questions you have with your health care provider. Document Released: 04/25/2015 Document Revised: 12/17/2015 Document Reviewed: 01/28/2015 Elsevier Interactive Patient Education  2017 [place] Prevention in the Home Falls can cause injuries. They can happen to people of all ages. There are many things you can do to make your home safe and to help prevent falls. What can I do on the outside of my home?  Regularly fix the edges of walkways and driveways and fix any cracks.  Remove anything that might make you trip as you walk through a door, such as a raised step or threshold.  Trim any bushes or trees on the path to your home.  Use bright outdoor lighting.  Clear any walking paths of anything that might make someone trip, such as rocks or tools.  Regularly check to see if handrails are loose or broken. Make sure that both sides of any steps have handrails.  Any raised decks and porches should have guardrails on the edges.  Have any leaves, snow, or ice cleared regularly.  Use sand or salt on  walking paths during winter.  Clean up any spills in your garage right away. This includes oil or grease spills. What can I do in the bathroom?  Use night lights.  Install grab bars by the toilet and in the tub and shower. Do not use towel bars as grab bars.  Use non-skid mats or decals in the tub or shower.  If you need to sit down in the shower, use a plastic, non-slip stool.  Keep the floor dry. Clean up any water that spills on the floor as soon as it happens.  Remove soap buildup in the tub or shower regularly.  Attach bath mats securely with double-sided non-slip rug tape.  Do not have throw rugs and other things on the floor that can make you trip. What can I do in the bedroom?  Use night lights.  Make sure that you have a light by your bed that is easy to reach.  Do not use any sheets or blankets that are too big for your bed. They should not hang down onto the floor.  Have a firm chair that has side arms. You can use this for support while you get dressed.  Do not have throw rugs and other things on the floor that can make you trip. What can I do in the kitchen?  Clean up any spills right away.  Avoid walking on wet floors.  Keep items that you use a lot in easy-to-reach places.  If you need to reach something above you, use a strong step stool that has a grab bar.  Keep electrical cords out of the way.  Do not use floor polish or wax that makes floors slippery. If you must use wax, use non-skid floor wax.  Do not have throw rugs and other things on the floor that can make you trip. What can I do with my stairs?  Do not leave any items on the stairs.  Make sure that there are handrails on both sides of the stairs and use them. Fix handrails that are broken or loose. Make sure that handrails are as long as the stairways.  Check any carpeting to make sure that it is firmly attached to the stairs. Fix any carpet that is loose or worn.  Avoid having throw  rugs at the top or bottom of the stairs. If you do have throw rugs, attach them to the floor with carpet tape.  Make sure that you have a light switch at the top of the stairs and the bottom of the stairs. If you do not have them, ask someone to add them for you. What else can I do to help prevent falls?  Wear shoes that:  Do not have high heels.  Have rubber bottoms.  Are comfortable and fit you well.  Are closed at the toe. Do not wear sandals.  If you use a stepladder:  Make sure that it is fully opened. Do not climb a closed stepladder.  Make sure that both sides of the stepladder are locked into place.  Ask someone to hold it for you, if possible.  Clearly mark and make sure that you can see:  Any grab bars or handrails.  First and last steps.  Where the edge of each step is.  Use tools that help you move around (mobility aids) if they are needed. These include:  Canes.  Walkers.  Scooters.  Crutches.  Turn on the lights when you go into a dark area. Replace any light bulbs as soon as they burn out.  Set up your furniture so you have a clear path. Avoid moving your furniture around.  If any of your floors are uneven, fix them.  If there are any pets around you, be aware of where they are.  Review your medicines with your doctor. Some medicines can make you feel dizzy. This can increase your chance of falling. Ask your doctor what other things that you can do to help prevent falls. This information is not intended to replace advice given to you by your health care provider. Make sure you discuss any questions you have with your health care provider. Document Released: 01/23/2009 Document Revised: 09/04/2015 Document Reviewed: 05/03/2014 Elsevier Interactive Patient Education  2017 [employer].

## 2020-08-19 ENCOUNTER — Encounter: Payer: Self-pay | Admitting: Family Medicine

## 2020-08-19 ENCOUNTER — Ambulatory Visit (INDEPENDENT_AMBULATORY_CARE_PROVIDER_SITE_OTHER): Payer: Medicare Other

## 2020-08-19 ENCOUNTER — Ambulatory Visit (INDEPENDENT_AMBULATORY_CARE_PROVIDER_SITE_OTHER): Payer: Medicare Other | Admitting: Family Medicine

## 2020-08-19 ENCOUNTER — Other Ambulatory Visit: Payer: Self-pay | Admitting: Cardiology

## 2020-08-19 DIAGNOSIS — R55 Syncope and collapse: Secondary | ICD-10-CM

## 2020-08-19 DIAGNOSIS — M25571 Pain in right ankle and joints of right foot: Secondary | ICD-10-CM | POA: Diagnosis not present

## 2020-08-19 MED ORDER — METHYLPREDNISOLONE 4 MG PO TBPK: ORAL_TABLET | ORAL | 0 refills | Status: DC

## 2020-08-19 MED ORDER — COLCHICINE 0.6 MG PO CAPS: 1.0000 | ORAL_CAPSULE | Freq: Two times a day (BID) | ORAL | 3 refills | Status: DC | PRN

## 2020-08-19 NOTE — Telephone Encounter (Signed)
Called pt. No answer. Left msg to call back.  

## 2020-08-19 NOTE — Telephone Encounter (Signed)
We can arrange a 7-day Zio patch.

## 2020-08-19 NOTE — Telephone Encounter (Signed)
Pt to come into the [facility] office this afternoon for Zio patch placement.

## 2020-08-19 NOTE — Telephone Encounter (Signed)
She last wore a heart monitor in 2020.  No definite arrhythmias at that time.  Please find out from her how frequently she is having these episodes of apparent syncope and if she is having any other palpitations.  Based on symptom frequency, we can determine how long she could wear a Zio patch.

## 2020-08-19 NOTE — Telephone Encounter (Addendum)
Pt states that she is have syncope episodes. She states that her heart starts to beat fast and then she passes out. Pt refused to be see in ED during episodes. She denies having SOB or chest pain during this times. Pt states that her PCP is requesting that she wear a monitor. Please advise.

## 2020-08-19 NOTE — Telephone Encounter (Addendum)
Bethany Wang reports that 3 weeks ago she had one episode of syncope.The last week she had two episodes of syncope, and this week she has had three episodes of syncope.   The only symptom she experiences prior to syncope is a feeling that her heart is racing out of her chest. The rest of the time she has no c/o palpitations.

## 2020-08-19 NOTE — Progress Notes (Signed)
Office Visit Note   Patient: Bethany Wang           Date of Birth: [birth year]           MRN: [id number] Visit Date: 08/19/2020 Requested by: [name], MD [address] [place],  [place] [id number] PCP: [name], MD  Subjective: Chief Complaint  Patient presents with  . Right Foot - Pain    Tenderness in the bottom of the forefoot. Hurts to wear a shoe -- foot swells when she tries to wear a shoe. Started 1&1/2 weeks ago. Had recent xray per PCP. H/o fx of 2 of her toes 4 years ago, after dropping a frying pain on her foot.    HPI: She is here with right foot pain.  Symptoms started a week and a half ago, no injury.  She woke up 1 day, stepped out of bed and felt immediate severe pain on the bottom of her foot.  She has been unable to bear full weight since then.  She went to her PCP who ordered x-rays which were negative for acute fracture, I reviewed those myself today.  She is concerned because her son gets married next month and she needs to be able to walk for the wedding.  She has a history of diabetes which has been well controlled.  She exercises by walking 2 hours/day.  She has not had a significant change in her routine.  Her mother has a history of gout, but patient has never had gout.              ROS:   All other systems were reviewed and are negative.  Objective: Vital Signs: There were no vitals taken for this visit.  Physical Exam:  General:  Alert and oriented, in no acute distress. Pulm:  Breathing unlabored. Psy:  Normal mood, congruent affect. Skin: No erythema.  There is a slight bruise appearance on the plantar aspect of the third MTP joint. Right foot: She has diffuse tenderness and some soft tissue swelling in the foot, she is tender all across the dorsum and the plantar aspect.  Imaging: No results found.  Assessment & Plan: 1.  Acute right foot pain, etiology uncertain.  Question gout. -We will try Medrol Dosepak, colchicine.  If she is  not improving in a couple days, she will contact me and I will order an MRI scan.  Postop shoe given for comfort.     Procedures: No procedures performed        PMFS History: Patient Active Problem List   Diagnosis Date Noted  . Loss of weight 05/20/2020  . Abdominal pain 05/20/2020  . Trichimoniasis 08/20/2019  . Screening examination for STD (sexually transmitted disease) 08/15/2019  . Vaginal itching 08/15/2019  . Hypertension 08/15/2019  . Vitamin D deficiency disease 04/03/2019  . Decreased libido 12/25/2018  . Angina pectoris ([place]) 10/25/2018  . Palpitations 06/06/2018  . Chest pain 07/13/2017  . Nonspecific chest pain 07/13/2017  . Mixed hyperlipidemia 07/13/2017  . Precordial chest pain   . Hypertensive urgency 06/15/2016  . Unstable angina pectoris ([place]) 06/15/2016  . Unstable angina ([place]) 06/15/2016  . Bilateral breast cysts 12/30/2015  . Vaginal discharge 09/09/2015  . BV (bacterial vaginosis) 09/09/2015  . Shingles 09/09/2015  . History of breast cancer 09/09/2015  . Syncope and collapse 08/08/2015  . Migraine with aura and with status migrainosus, not intractable 08/08/2015  . Chronic low back pain 08/08/2015  . Insomnia 08/08/2015  .  Anxiety state 08/08/2015  . Panic attacks 08/08/2015  . Anemia, iron deficiency 08/08/2015  . Pica 08/08/2015  . Angina decubitus ([place]) 07/03/2014  . 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 Encounter for gynecological examination with Papanicolaou smear of cervix 02/04/2014  . Syncope 12/14/2013  . Cervical disc disorder with radiculopathy of cervical region 02/19/2013  . H/O rotator cuff surgery 02/19/2013  . Dyspareunia 02/01/2013  . Left shoulder pain 01/24/2013  . Rotator cuff tear 01/24/2013  . Radicular pain 01/24/2013  . Labral tear of shoulder 01/24/2013  . Complex regional pain syndrome of upper extremity 01/24/2013  . Herniated disc, cervical 01/24/2013  . Nausea and vomiting 01/18/2013  . Rectal  bleeding 01/18/2013  . Edema of left foot 10/16/2012  . Coronary atherosclerosis of native coronary artery   . Essential hypertension, benign   . HLD (hyperlipidemia)   . Tobacco abuse    Past Medical History:  Diagnosis Date  . Anemia   . Anxiety   . Bilateral breast cysts 12/30/2015  . Brain tumor ([place])   . Breast cancer ([place])    Left Breast Cancer  . Breast disorder   . BV (bacterial vaginosis) 09/09/2015  . Coronary atherosclerosis of native coronary artery    a. s/p DES to RCA in 2013 b. DES to LAD in 2014 c. cath in 04/2016 showing patent LAD stent with D2 jailed by LAD stent and CTO of RCA with left to right collaterals present  . Cyst of pharynx or nasopharynx    [name]  . Decreased libido 12/25/2018  . Depression   . Diabetes mellitus without complication ([place])   . Essential hypertension   . Falls    x 3-4 in past year  . GERD (gastroesophageal reflux disease)   . Headache   . History of hematuria   . Mixed hyperlipidemia   . Personal history of chemotherapy    Left Breast Cancer  . Personal history of radiation therapy    Left Breast Cancer  . Pre-diabetes   . PUD (peptic ulcer disease)   . Seizures ([place])   . Shingles 09/09/2015  . Stroke ([place])    12-2017 on Plavix, only deficit is headaches  . Suicide attempt ([place])    3 attempts in remote past  . Trichimoniasis 08/20/2019   Treated 08/20/19   . Uterine cancer ([place])   . Vitamin D deficiency disease 04/03/2019    Family History  Problem Relation Age of Onset  . Colon cancer Paternal Grandfather   . Cancer Father   . Cancer Maternal Uncle   . Alzheimer's disease Paternal Aunt   . Cirrhosis Maternal Uncle   . Cancer Paternal Aunt        lung  . Aneurysm Mother        brain  . Heart disease Brother   . Heart attack Brother   . Mental illness Daughter   . ADD / ADHD Daughter   . Bipolar disorder Daughter   . Mental illness Son   . ADD / ADHD Son   . Bipolar disorder Son   .  Mental illness Son   . ADD / ADHD Son   . Bipolar disorder Son     Past Surgical History:  Procedure Laterality Date  . ABDOMINAL HYSTERECTOMY    . BIOPSY  11/28/2019   Procedure: BIOPSY;  Surgeon: Rogene Houston, MD;  [facility];  Service: Endoscopy;;  antral, gastric body   . BLADDER SURGERY    .  BREAST LUMPECTOMY Left   . BREAST LUMPECTOMY Right 02/24/2018   Procedure: RIGHT BREAST LUMPECTOMY ERAS PATHWAY;  Surgeon: Jovita Kussmaul, MD;  Location: [facility];  Service: General;  Laterality: Right;  . CARDIAC CATHETERIZATION N/A 05/10/2016   Procedure: Left Heart Cath and Coronary Angiography;  Surgeon: Belva Crome, MD;  Location: [facility];  Service: Cardiovascular;  Laterality: N/A;  . CAUTERIZE INNER NOSE  07/13/2020  . COLONOSCOPY  May 2010   [place]man: normal rectum, internal hemorrhoids, , benign colonic polyp  . COLONOSCOPY N/A 01/05/2017   Procedure: COLONOSCOPY;  Surgeon: Rogene Houston, MD;  [facility];  Service: Endoscopy;  Laterality: N/A;  1:00  . ESOPHAGEAL DILATION  11/28/2019   Procedure: ESOPHAGEAL DILATION;  Surgeon: Rogene Houston, MD;  [facility];  Service: Endoscopy;;  . ESOPHAGOGASTRODUODENOSCOPY     2001 Dr. Amedeo Plenty: distal esophagitis, small hiatal hernia,. Dr. Tamala Julian 2006? no records available currently, pt also reports  EGD a few years ago with Dr. Gala Romney, do not have these reports anywhere in medical records  . ESOPHAGOGASTRODUODENOSCOPY  06/02/2011   UYQ:[id number] pill impaction as described above s/p dilation of a probable cervical esophageal web/bx abnormal esophageal and gastric mucosa. + H.pylori gastritis   . ESOPHAGOGASTRODUODENOSCOPY (EGD) WITH PROPOFOL N/A 11/28/2019   Procedure: ESOPHAGOGASTRODUODENOSCOPY (EGD) WITH PROPOFOL;  Surgeon: Rogene Houston, MD;  [facility];  Service: Endoscopy;  Laterality: N/A;  1020  . EYE SURGERY     Removed glass  . HAND SURGERY Right   . Left breast lumpectomy      Benign  . LEFT HEART CATH AND CORONARY ANGIOGRAPHY N/A 10/25/2018   Procedure: LEFT HEART CATH AND CORONARY ANGIOGRAPHY;  Surgeon: Wellington Hampshire, MD;  Location: [facility];  Service: Cardiovascular;  Laterality: N/A;  . LEFT HEART CATHETERIZATION WITH CORONARY ANGIOGRAM N/A 07/03/2014   Procedure: LEFT HEART CATHETERIZATION WITH CORONARY ANGIOGRAM;  Surgeon: Belva Crome, MD;  Location: [facility];  Service: Cardiovascular;  Laterality: N/A;  . POLYPECTOMY  01/05/2017   Procedure: POLYPECTOMY;  Surgeon: Rogene Houston, MD;  [facility];  Service: Endoscopy;;  colon  . SHOULDER SURGERY Left    Social History   Occupational History  . Occupation: Retired    Comment: disability  Tobacco Use  . Smoking status: Former Smoker    Packs/day: 0.20    Years: 32.00    Pack years: 6.40    Types: Cigarettes, Cigars    Start date: 07/05/1983    Quit date: 04/03/2012    Years since quitting: 8.3  . Smokeless tobacco: Never Used  Vaping Use  . Vaping Use: Never used  Substance and Sexual Activity  . Alcohol use: No    Alcohol/week: 0.0 standard drinks  . Drug use: No  . Sexual activity: Yes    Birth control/protection: Surgical    Comment: hyst

## 2020-08-20 DIAGNOSIS — E119 Type 2 diabetes mellitus without complications: Secondary | ICD-10-CM | POA: Diagnosis not present

## 2020-08-20 DIAGNOSIS — H905 Unspecified sensorineural hearing loss: Secondary | ICD-10-CM | POA: Diagnosis not present

## 2020-08-20 NOTE — Telephone Encounter (Signed)
Ziopatch placed.

## 2020-08-21 DIAGNOSIS — H04123 Dry eye syndrome of bilateral lacrimal glands: Secondary | ICD-10-CM | POA: Diagnosis not present

## 2020-08-25 ENCOUNTER — Other Ambulatory Visit (INDEPENDENT_AMBULATORY_CARE_PROVIDER_SITE_OTHER): Payer: Self-pay | Admitting: Internal Medicine

## 2020-08-26 ENCOUNTER — Other Ambulatory Visit: Payer: Self-pay | Admitting: Cardiology

## 2020-08-26 DIAGNOSIS — I25119 Atherosclerotic heart disease of native coronary artery with unspecified angina pectoris: Secondary | ICD-10-CM

## 2020-08-26 NOTE — Telephone Encounter (Signed)
Per medication list, NTG was d/c'd on 08/11/2020 - d/c reason "patient preference".  - done by Dr. Lanice Shirts office.   Left message to return call.

## 2020-08-27 MED ORDER — NITROGLYCERIN 0.4 MG SL SUBL: 0.4000 mg | SUBLINGUAL_TABLET | SUBLINGUAL | 3 refills | Status: DC | PRN

## 2020-08-27 NOTE — Telephone Encounter (Signed)
Spoke to pt who stated that she has plenty and she does not need any refills at the moment. She states that she still carries the medication and it was a mistake that Dr. Manson Allan office took it off of her medication list. Medication added back to patient's list.

## 2020-08-27 NOTE — Telephone Encounter (Signed)
LMFCB

## 2020-09-01 DIAGNOSIS — R55 Syncope and collapse: Secondary | ICD-10-CM | POA: Diagnosis not present

## 2020-09-02 ENCOUNTER — Other Ambulatory Visit (INDEPENDENT_AMBULATORY_CARE_PROVIDER_SITE_OTHER): Payer: Self-pay | Admitting: Internal Medicine

## 2020-09-02 ENCOUNTER — Telehealth: Payer: Self-pay | Admitting: Cardiology

## 2020-09-02 ENCOUNTER — Telehealth (INDEPENDENT_AMBULATORY_CARE_PROVIDER_SITE_OTHER): Payer: Self-pay | Admitting: Internal Medicine

## 2020-09-02 MED ORDER — ALPRAZOLAM 0.5 MG PO TABS: 0.5000 mg | ORAL_TABLET | Freq: Two times a day (BID) | ORAL | 0 refills | Status: AC | PRN

## 2020-09-02 NOTE — Telephone Encounter (Signed)
Done

## 2020-09-02 NOTE — Telephone Encounter (Signed)
New message    Patient stopped in to get monitor results, Dr Lavonne Chick is asking patient if she received them because her bp is high

## 2020-09-02 NOTE — Telephone Encounter (Signed)
Pt notified that monitor results are being read by MD and nursing staff will call with results when available. Pt voiced understanding.

## 2020-09-03 ENCOUNTER — Encounter (INDEPENDENT_AMBULATORY_CARE_PROVIDER_SITE_OTHER): Payer: Self-pay | Admitting: Internal Medicine

## 2020-09-03 ENCOUNTER — Ambulatory Visit (INDEPENDENT_AMBULATORY_CARE_PROVIDER_SITE_OTHER): Payer: Medicare Other | Admitting: Internal Medicine

## 2020-09-03 ENCOUNTER — Other Ambulatory Visit: Payer: Self-pay

## 2020-09-03 VITALS — BP 169/99 | HR 71 | Temp 97.8°F | Resp 18 | Ht 64.0 in | Wt 152.4 lb

## 2020-09-03 DIAGNOSIS — B370 Candidal stomatitis: Secondary | ICD-10-CM

## 2020-09-03 MED ORDER — NYSTATIN 100000 UNIT/ML MT SUSP: 5.0000 mL | Freq: Four times a day (QID) | OROMUCOSAL | 0 refills | Status: DC

## 2020-09-03 NOTE — Progress Notes (Signed)
Metrics: Intervention Frequency ACO  Documented Smoking Status Yearly  Screened one or more times in 24 months  Cessation Counseling or  Active cessation medication Past 24 months  Past 24 months   Guideline developer: UpToDate (See UpToDate for funding source) Date Released: 2014       Wellness Office Visit  Subjective:  Patient ID: Bethany Wang, female    DOB: 1965-12-02  Age: 55 y.o. MRN: [id number]  CC: Oral thrush HPI  Acute visit with white spots in her mouth with soreness. Past Medical History:  Diagnosis Date  . Anemia   . Anxiety   . Bilateral breast cysts 12/30/2015  . Brain tumor ([place])   . Breast cancer ([place])    Left Breast Cancer  . Breast disorder   . BV (bacterial vaginosis) 09/09/2015  . Coronary atherosclerosis of native coronary artery    a. s/p DES to RCA in 2013 b. DES to LAD in 2014 c. cath in 04/2016 showing patent LAD stent with D2 jailed by LAD stent and CTO of RCA with left to right collaterals present  . Cyst of pharynx or nasopharynx    [name]  . Decreased libido 12/25/2018  . Depression   . Diabetes mellitus without complication ([place])   . Essential hypertension   . Falls    x 3-4 in past year  . GERD (gastroesophageal reflux disease)   . Headache   . History of hematuria   . Mixed hyperlipidemia   . Personal history of chemotherapy    Left Breast Cancer  . Personal history of radiation therapy    Left Breast Cancer  . Pre-diabetes   . PUD (peptic ulcer disease)   . Seizures ([place])   . Shingles 09/09/2015  . Stroke ([place])    12-2017 on Plavix, only deficit is headaches  . Suicide attempt ([place])    3 attempts in remote past  . Trichimoniasis 08/20/2019   Treated 08/20/19   . Uterine cancer ([place])   . Vitamin D deficiency disease 04/03/2019   Past Surgical History:  Procedure Laterality Date  . ABDOMINAL HYSTERECTOMY    . BIOPSY  11/28/2019   Procedure: BIOPSY;  Surgeon: Rogene Houston, MD;  [facility];  Service: Endoscopy;;  antral, gastric body   . BLADDER SURGERY    . BREAST LUMPECTOMY Left   . BREAST LUMPECTOMY Right 02/24/2018   Procedure: RIGHT BREAST LUMPECTOMY ERAS PATHWAY;  Surgeon: Jovita Kussmaul, MD;  Location: [facility];  Service: General;  Laterality: Right;  . CARDIAC CATHETERIZATION N/A 05/10/2016   Procedure: Left Heart Cath and Coronary Angiography;  Surgeon: Belva Crome, MD;  Location: [facility];  Service: Cardiovascular;  Laterality: N/A;  . CAUTERIZE INNER NOSE  07/13/2020  . COLONOSCOPY  May 2010   Fleishman: normal rectum, internal hemorrhoids, , benign colonic polyp  . COLONOSCOPY N/A 01/05/2017   Procedure: COLONOSCOPY;  Surgeon: Rogene Houston, MD;  [facility];  Service: Endoscopy;  Laterality: N/A;  1:00  . ESOPHAGEAL DILATION  11/28/2019   Procedure: ESOPHAGEAL DILATION;  Surgeon: Rogene Houston, MD;  [facility];  Service: Endoscopy;;  . ESOPHAGOGASTRODUODENOSCOPY     2001 Dr. Amedeo Plenty: distal esophagitis, small hiatal hernia,. Dr. Tamala Julian 2006? no records available currently, pt also reports  EGD a few years ago with Dr. Gala Romney, do not have these reports anywhere in medical records  . ESOPHAGOGASTRODUODENOSCOPY  06/02/2011   [id number] pill impaction as described  above s/p dilation of a probable cervical esophageal web/bx abnormal esophageal and gastric mucosa. + H.pylori gastritis   . ESOPHAGOGASTRODUODENOSCOPY (EGD) WITH PROPOFOL N/A 11/28/2019   Procedure: ESOPHAGOGASTRODUODENOSCOPY (EGD) WITH PROPOFOL;  Surgeon: Rogene Houston, MD;  [facility];  Service: Endoscopy;  Laterality: N/A;  1020  . EYE SURGERY     Removed glass  . HAND SURGERY Right   . Left breast lumpectomy     Benign  . LEFT HEART CATH AND CORONARY ANGIOGRAPHY N/A 10/25/2018   Procedure: LEFT HEART CATH AND CORONARY ANGIOGRAPHY;  Surgeon: Wellington Hampshire, MD;  Location: [facility];  Service: Cardiovascular;  Laterality: N/A;  . LEFT  HEART CATHETERIZATION WITH CORONARY ANGIOGRAM N/A 07/03/2014   Procedure: LEFT HEART CATHETERIZATION WITH CORONARY ANGIOGRAM;  Surgeon: Belva Crome, MD;  Location: [facility];  Service: Cardiovascular;  Laterality: N/A;  . POLYPECTOMY  01/05/2017   Procedure: POLYPECTOMY;  Surgeon: Rogene Houston, MD;  [facility];  Service: Endoscopy;;  colon  . SHOULDER SURGERY Left      Family History  Problem Relation Age of Onset  . Colon cancer Paternal Grandfather   . Cancer Father   . Cancer Maternal Uncle   . Alzheimer's disease Paternal Aunt   . Cirrhosis Maternal Uncle   . Cancer Paternal Aunt        lung  . Aneurysm Mother        brain  . Heart disease Brother   . Heart attack Brother   . Mental illness Daughter   . ADD / ADHD Daughter   . Bipolar disorder Daughter   . Mental illness Son   . ADD / ADHD Son   . Bipolar disorder Son   . Mental illness Son   . ADD / ADHD Son   . Bipolar disorder Son     Social History   Social History Narrative   Patient lives at new home with husband and extended family..On disability since age 80 yrs.   Social History   Tobacco Use  . Smoking status: Former Smoker    Packs/day: 0.20    Years: 32.00    Pack years: 6.40    Types: Cigarettes, Cigars    Start date: 07/05/1983    Quit date: 04/03/2012    Years since quitting: 8.4  . Smokeless tobacco: Never Used  Substance Use Topics  . Alcohol use: No    Alcohol/week: 0.0 standard drinks    Current Meds  Medication Sig  . ALPRAZolam (XANAX) 0.5 MG tablet Take 1 tablet (0.5 mg total) by mouth 2 (two) times daily as needed for anxiety.  [name] aspirin EC 81 MG tablet Take 1 tablet (81 mg total) by mouth daily.  [name] atorvastatin (LIPITOR) 40 MG tablet Take 1 tablet (40 mg total) by mouth daily at 6 PM.  . Colchicine (MITIGARE) 0.6 MG CAPS Take 1 capsule by mouth 2 (two) times daily as needed.  . fluorometholone (FML) 0.1 % ophthalmic suspension SMARTSIG:In Eye(s)  .  fluticasone-salmeterol (ADVAIR) 100-50 MCG/ACT AEPB INHALE 1 PUFF BY MOUTH TWICE DAILY  . methylPREDNISolone (MEDROL DOSEPAK) 4 MG TBPK tablet As directed for 6 days.  . metoprolol tartrate (LOPRESSOR) 25 MG tablet Take 1 tablet (25 mg total) by mouth 2 (two) times daily.  . nitroGLYCERIN (NITROSTAT) 0.4 MG SL tablet Place 1 tablet (0.4 mg total) under the tongue every 5 (five) minutes as needed for chest pain.  [name] nystatin (MYCOSTATIN) 100000 UNIT/ML suspension  Take 5 mLs (500,000 Units total) by mouth 4 (four) times daily.  . pantoprazole (PROTONIX) 40 MG tablet TAKE ONE (1) TABLET BY MOUTH TWICE DAILY BEFORE A MEAL   Current Facility-Administered Medications for the 09/03/20 encounter (Office Visit) with [name], MD  Medication  . NONFORMULARY OR COMPOUNDED ITEM 0.2 mL     Flowsheet Row Clinical Support from 08/18/2020 in [facility]  PHQ-9 Total Score 0      Objective:   Today's Vitals: BP (!) 169/99 (BP Location: Left Arm, Patient Position: Sitting, Cuff Size: Normal)   Pulse 71   Temp 97.8 F (36.6 C) (Temporal)   Resp 18   Ht 5\' 4"  (1.626 m)   Wt 152 lb 6.4 oz (69.1 kg)   SpO2 98%   BMI 26.16 kg/m  Vitals with BMI 09/03/2020 08/18/2020 08/11/2020  Height 5\' 4"  5\' 4"  5\' 4"   Weight 152 lbs 6 oz 161 lbs 6 oz 160 lbs 13 oz  BMI 26.15 28.76 81.15  Systolic 726 203 559  Diastolic 99 78 741  Pulse 71 67 77     Physical Exam Oral thrush     Assessment   1. Oral thrush       Tests ordered No orders of the defined types were placed in this encounter.    Plan: 1. Nystatin swish and swallow -she will let me know if she does not improve.   Meds ordered this encounter  Medications  . nystatin (MYCOSTATIN) 100000 UNIT/ML suspension    Sig: Take 5 mLs (500,000 Units total) by mouth 4 (four) times daily.    Dispense:  60 mL    Refill:  0    Pedro Oldenburg Luther Parody, MD

## 2020-09-05 ENCOUNTER — Other Ambulatory Visit: Payer: Self-pay | Admitting: Cardiology

## 2020-09-22 ENCOUNTER — Ambulatory Visit (INDEPENDENT_AMBULATORY_CARE_PROVIDER_SITE_OTHER): Payer: Medicare Other | Admitting: Internal Medicine

## 2020-09-22 ENCOUNTER — Encounter (INDEPENDENT_AMBULATORY_CARE_PROVIDER_SITE_OTHER): Payer: Self-pay | Admitting: Internal Medicine

## 2020-09-22 ENCOUNTER — Other Ambulatory Visit: Payer: Self-pay

## 2020-09-22 VITALS — BP 152/86 | HR 73 | Temp 97.3°F | Ht 64.0 in | Wt 155.6 lb

## 2020-09-22 DIAGNOSIS — G43919 Migraine, unspecified, intractable, without status migrainosus: Secondary | ICD-10-CM | POA: Diagnosis not present

## 2020-09-22 DIAGNOSIS — E119 Type 2 diabetes mellitus without complications: Secondary | ICD-10-CM | POA: Diagnosis not present

## 2020-09-22 MED ORDER — TRAMADOL HCL 50 MG PO TABS: 50.0000 mg | ORAL_TABLET | Freq: Three times a day (TID) | ORAL | 0 refills | Status: AC | PRN

## 2020-09-22 MED ORDER — PREDNISONE 20 MG PO TABS: 40.0000 mg | ORAL_TABLET | Freq: Every day | ORAL | 1 refills | Status: DC

## 2020-09-22 NOTE — Progress Notes (Signed)
L Metrics: Intervention Frequency ACO  Documented Smoking Status Yearly  Screened one or more times in 24 months  Cessation Counseling or  Active cessation medication Past 24 months  Past 24 months   Guideline developer: UpToDate (See UpToDate for funding source) Date Released: 2014       Wellness Office Visit  Subjective:  Patient ID: Bethany Wang, female    DOB: Jul 13, 1965  Age: 55 y.o. MRN: [id number]  CC: Migraine headache HPI  This lady comes in with a 5-day history of migrainous headaches which she has experienced before.  These typically are frontal in nature and associated with photophobia, and nausea.  Thankfully, she has not had vomiting.  She has been using ibuprofen over-the-counter 6 tablets in a day.  She is afraid to use any other medications due to her cardiac issues. Past Medical History:  Diagnosis Date   Anemia    Anxiety    Bilateral breast cysts 12/30/2015   Brain tumor ([place])    Breast cancer ([place])    Left Breast Cancer   Breast disorder    BV (bacterial vaginosis) 09/09/2015   Coronary atherosclerosis of native coronary artery    a. s/p DES to RCA in 2013 b. DES to LAD in 2014 c. cath in 04/2016 showing patent LAD stent with D2 jailed by LAD stent and CTO of RCA with left to right collaterals present   Cyst of pharynx or nasopharynx    [name]   Decreased libido 12/25/2018   Depression    Diabetes mellitus without complication ([place])    Essential hypertension    Falls    x 3-4 in past year   GERD (gastroesophageal reflux disease)    Headache    History of hematuria    Mixed hyperlipidemia    Personal history of chemotherapy    Left Breast Cancer   Personal history of radiation therapy    Left Breast Cancer   Pre-diabetes    PUD (peptic ulcer disease)    Seizures ([place])    Shingles 09/09/2015   Stroke ([place])    12-2017 on Plavix, only deficit is headaches   Suicide attempt ([place])    3 attempts in remote past   Trichimoniasis  08/20/2019   Treated 08/20/19    Uterine cancer ([place])    Vitamin D deficiency disease 04/03/2019   Past Surgical History:  Procedure Laterality Date   ABDOMINAL HYSTERECTOMY     BIOPSY  11/28/2019   Procedure: BIOPSY;  Surgeon: Rogene Houston, MD;  [facility];  Service: Endoscopy;;  antral, gastric body    BLADDER SURGERY     BREAST LUMPECTOMY Left    BREAST LUMPECTOMY Right 02/24/2018   Procedure: RIGHT BREAST LUMPECTOMY ERAS PATHWAY;  Surgeon: Jovita Kussmaul, MD;  Location: [facility] OR;  Service: General;  Laterality: Right;   CARDIAC CATHETERIZATION N/A 05/10/2016   Procedure: Left Heart Cath and Coronary Angiography;  Surgeon: Belva Crome, MD;  Location: [facility];  Service: Cardiovascular;  Laterality: N/A;   CAUTERIZE INNER NOSE  07/13/2020   COLONOSCOPY  May 2010   Fleishman: normal rectum, internal hemorrhoids, , benign colonic polyp   COLONOSCOPY N/A 01/05/2017   Procedure: COLONOSCOPY;  Surgeon: Rogene Houston, MD;  [facility];  Service: Endoscopy;  Laterality: N/A;  1:00   ESOPHAGEAL DILATION  11/28/2019   Procedure: ESOPHAGEAL DILATION;  Surgeon: Rogene Houston, MD;  [facility];  Service: Endoscopy;;  ESOPHAGOGASTRODUODENOSCOPY     2001 Dr. Amedeo Plenty: distal esophagitis, small hiatal hernia,. Dr. Tamala Julian 2006? no records available currently, pt also reports  EGD a few years ago with Dr. Gala Romney, do not have these reports anywhere in medical records   ESOPHAGOGASTRODUODENOSCOPY  06/02/2011   [id number] pill impaction as described above s/p dilation of a probable cervical esophageal web/bx abnormal esophageal and gastric mucosa. + H.pylori gastritis    ESOPHAGOGASTRODUODENOSCOPY (EGD) WITH PROPOFOL N/A 11/28/2019   Procedure: ESOPHAGOGASTRODUODENOSCOPY (EGD) WITH PROPOFOL;  Surgeon: Rogene Houston, MD;  [facility];  Service: Endoscopy;  Laterality: N/A;  1020   EYE SURGERY     Removed glass   HAND SURGERY Right    Left  breast lumpectomy     Benign   LEFT HEART CATH AND CORONARY ANGIOGRAPHY N/A 10/25/2018   Procedure: LEFT HEART CATH AND CORONARY ANGIOGRAPHY;  Surgeon: Wellington Hampshire, MD;  Location: [facility];  Service: Cardiovascular;  Laterality: N/A;   LEFT HEART CATHETERIZATION WITH CORONARY ANGIOGRAM N/A 07/03/2014   Procedure: LEFT HEART CATHETERIZATION WITH CORONARY ANGIOGRAM;  Surgeon: Belva Crome, MD;  Location: [facility] CATH LAB;  Service: Cardiovascular;  Laterality: N/A;   POLYPECTOMY  01/05/2017   Procedure: POLYPECTOMY;  Surgeon: Rogene Houston, MD;  [facility];  Service: Endoscopy;;  colon   SHOULDER SURGERY Left      Family History  Problem Relation Age of Onset   Colon cancer Paternal Grandfather    Cancer Father    Cancer Maternal Uncle    Alzheimer's disease Paternal Aunt    Cirrhosis Maternal Uncle    Cancer Paternal Aunt        lung   Aneurysm Mother        brain   Heart disease Brother    Heart attack Brother    Mental illness Daughter    ADD / ADHD Daughter    Bipolar disorder Daughter    Mental illness Son    ADD / ADHD Son    Bipolar disorder Son    Mental illness Son    ADD / ADHD Son    Bipolar disorder Son     Social History   Social History Narrative   Patient lives at new home with husband and extended family..On disability since age 51 yrs.   Social History   Tobacco Use   Smoking status: Former    Packs/day: 0.20    Years: 32.00    Pack years: 6.40    Types: Cigarettes, Cigars    Start date: 07/05/1983    Quit date: 04/03/2012    Years since quitting: 8.4   Smokeless tobacco: Never  Substance Use Topics   Alcohol use: No    Alcohol/week: 0.0 standard drinks    Current Meds  Medication Sig   ALPRAZolam (XANAX) 0.5 MG tablet Take 1 tablet (0.5 mg total) by mouth 2 (two) times daily as needed for anxiety.   amLODipine (NORVASC) 10 MG tablet Take 1 tablet by mouth daily.   aspirin EC 81 MG tablet Take 1 tablet (81 mg total)  by mouth daily.   atorvastatin (LIPITOR) 40 MG tablet Take 1 tablet (40 mg total) by mouth daily at 6 PM.   Colchicine (MITIGARE) 0.6 MG CAPS Take 1 capsule by mouth 2 (two) times daily as needed.   escitalopram (LEXAPRO) 20 MG tablet Take 1 tablet by mouth daily.   fluorometholone (FML) 0.1 % ophthalmic suspension SMARTSIG:In Eye(s)   fluticasone (FLONASE)  50 MCG/ACT nasal spray Place into both nostrils.   fluticasone-salmeterol (ADVAIR) 100-50 MCG/ACT AEPB INHALE 1 PUFF BY MOUTH TWICE DAILY   isosorbide mononitrate (IMDUR) 30 MG 24 hr tablet Take 90 mg by mouth daily.   LINZESS 145 MCG CAPS capsule Take 145 mcg by mouth daily.   losartan (COZAAR) 100 MG tablet Take 1 tablet by mouth daily.   metoprolol tartrate (LOPRESSOR) 25 MG tablet Take 1 tablet (25 mg total) by mouth 2 (two) times daily.   nitroGLYCERIN (NITROSTAT) 0.4 MG SL tablet DISSOLVE 1 TABLET UNDER THE TONGUE AS NEEDED FOR CHEST PAIN EVERY 5 MINUTES UP TO 3 TIMES. IF NO RELIEF CALL 911.   nystatin (MYCOSTATIN) 100000 UNIT/ML suspension Take 5 mLs (500,000 Units total) by mouth 4 (four) times daily.   pantoprazole (PROTONIX) 40 MG tablet TAKE ONE (1) TABLET BY MOUTH TWICE DAILY BEFORE A MEAL   Current Facility-Administered Medications for the 09/22/20 encounter (Office Visit) with [name], MD  Medication   NONFORMULARY OR COMPOUNDED ITEM 0.2 mL     Flowsheet Row Clinical Support from 08/18/2020 in [facility]  PHQ-9 Total Score 0       Objective:   Today's Vitals: BP (!) 152/86   Pulse 73   Temp (!) 97.3 F (36.3 C) (Temporal)   Ht 5\' 4"  (1.626 m)   Wt 155 lb 9.6 oz (70.6 kg)   SpO2 96%   BMI 26.71 kg/m  Vitals with BMI 09/22/2020 09/03/2020 08/18/2020  Height 5\' 4"  5\' 4"  5\' 4"   Weight 155 lbs 10 oz 152 lbs 6 oz 161 lbs 6 oz  BMI 26.7 88.11 03.15  Systolic 945 859 292  Diastolic 86 99 78  Pulse 73 71 67     Physical Exam  She looks systemically well.  She is alert and orientated without any  focal logical signs.     Assessment   1. Intractable migraine without status migrainosus, unspecified migraine type       Tests ordered No orders of the defined types were placed in this encounter.    Plan: 1.  Dexamethasone 4 mg intramuscular was given in the office to see if steroids will help the migrainous headaches.  I have followed that up with prednisone 40 mg every morning for 5 days which will start tomorrow.  If she does not improve, she will let me know.    No orders of the defined types were placed in this encounter.   [name], MD

## 2020-09-24 DIAGNOSIS — H04123 Dry eye syndrome of bilateral lacrimal glands: Secondary | ICD-10-CM | POA: Diagnosis not present

## 2020-10-01 ENCOUNTER — Telehealth (INDEPENDENT_AMBULATORY_CARE_PROVIDER_SITE_OTHER): Payer: Self-pay

## 2020-10-01 ENCOUNTER — Other Ambulatory Visit (INDEPENDENT_AMBULATORY_CARE_PROVIDER_SITE_OTHER): Payer: Self-pay | Admitting: Internal Medicine

## 2020-10-01 DIAGNOSIS — R040 Epistaxis: Secondary | ICD-10-CM

## 2020-10-01 NOTE — Telephone Encounter (Signed)
Yes,order is put into Dr [name]. Waiting for him to sign off then I can fax over once completed. Thank you

## 2020-10-01 NOTE — Telephone Encounter (Signed)
Cap nurse needs  a order for a humidifier to send to [facility] to [employer]. Pt is having nose bleeds more now again.

## 2020-10-01 NOTE — Telephone Encounter (Signed)
Sent you a voice message from [facility] for humidifier order today.

## 2020-10-04 IMAGING — DX DG CHEST 1V PORT
1 series · 1 of 1 positions shown · non-contrast
Comparison: 08/03/2019

CLINICAL DATA: Chest pain and cough.

EXAM:
PORTABLE CHEST 1 VIEW

[chest ap]
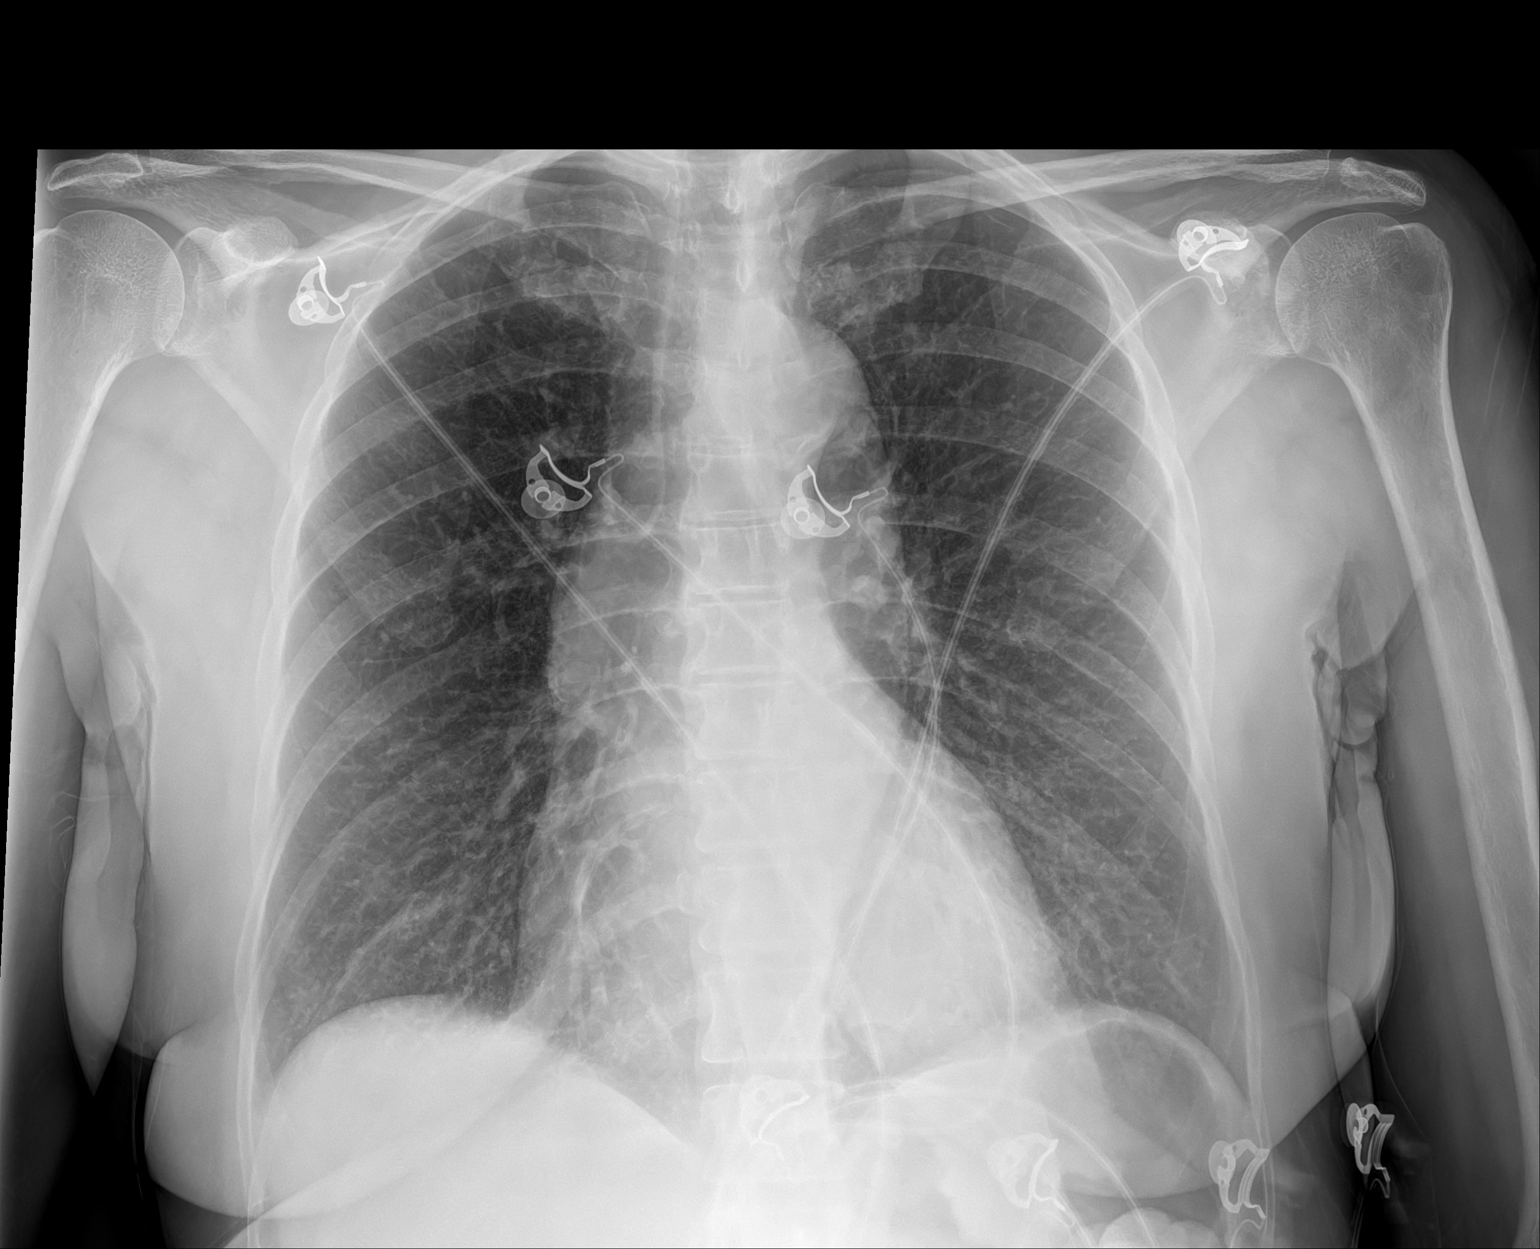

[1 of 1 positions shown; findings below may reference images not displayed]

FINDINGS: The heart size and mediastinal contours are within normal limits.
Both lungs are clear. The visualized skeletal structures are
unremarkable.
IMPRESSION: No active disease.

## 2020-10-21 ENCOUNTER — Other Ambulatory Visit (INDEPENDENT_AMBULATORY_CARE_PROVIDER_SITE_OTHER): Payer: Self-pay | Admitting: Internal Medicine

## 2020-10-22 ENCOUNTER — Other Ambulatory Visit: Payer: Self-pay

## 2020-10-22 ENCOUNTER — Telehealth (INDEPENDENT_AMBULATORY_CARE_PROVIDER_SITE_OTHER): Payer: Self-pay

## 2020-10-22 ENCOUNTER — Inpatient Hospital Stay (HOSPITAL_COMMUNITY)
Admission: EM | Admit: 2020-10-22 | Discharge: 2020-10-23 | DRG: 287 | Discharge status: Against Medical Advice | Payer: Medicare Other | Attending: Cardiology | Admitting: Cardiology

## 2020-10-22 ENCOUNTER — Emergency Department (HOSPITAL_COMMUNITY): Payer: Medicare Other

## 2020-10-22 ENCOUNTER — Encounter (HOSPITAL_COMMUNITY): Payer: Self-pay

## 2020-10-22 DIAGNOSIS — Z20822 Contact with and (suspected) exposure to covid-19: Secondary | ICD-10-CM | POA: Diagnosis present

## 2020-10-22 DIAGNOSIS — Z8673 Personal history of transient ischemic attack (TIA), and cerebral infarction without residual deficits: Secondary | ICD-10-CM

## 2020-10-22 DIAGNOSIS — Z90710 Acquired absence of both cervix and uterus: Secondary | ICD-10-CM

## 2020-10-22 DIAGNOSIS — Z7982 Long term (current) use of aspirin: Secondary | ICD-10-CM | POA: Diagnosis not present

## 2020-10-22 DIAGNOSIS — Z955 Presence of coronary angioplasty implant and graft: Secondary | ICD-10-CM

## 2020-10-22 DIAGNOSIS — Z8711 Personal history of peptic ulcer disease: Secondary | ICD-10-CM

## 2020-10-22 DIAGNOSIS — E782 Mixed hyperlipidemia: Secondary | ICD-10-CM | POA: Diagnosis present

## 2020-10-22 DIAGNOSIS — Z7902 Long term (current) use of antithrombotics/antiplatelets: Secondary | ICD-10-CM | POA: Diagnosis not present

## 2020-10-22 DIAGNOSIS — I25118 Atherosclerotic heart disease of native coronary artery with other forms of angina pectoris: Secondary | ICD-10-CM

## 2020-10-22 DIAGNOSIS — F419 Anxiety disorder, unspecified: Secondary | ICD-10-CM | POA: Diagnosis present

## 2020-10-22 DIAGNOSIS — R55 Syncope and collapse: Secondary | ICD-10-CM

## 2020-10-22 DIAGNOSIS — I110 Hypertensive heart disease with heart failure: Secondary | ICD-10-CM

## 2020-10-22 DIAGNOSIS — Y712 Prosthetic and other implants, materials and accessory cardiovascular devices associated with adverse incidents: Secondary | ICD-10-CM | POA: Diagnosis present

## 2020-10-22 DIAGNOSIS — Z923 Personal history of irradiation: Secondary | ICD-10-CM | POA: Diagnosis not present

## 2020-10-22 DIAGNOSIS — E785 Hyperlipidemia, unspecified: Secondary | ICD-10-CM

## 2020-10-22 DIAGNOSIS — R079 Chest pain, unspecified: Secondary | ICD-10-CM | POA: Diagnosis not present

## 2020-10-22 DIAGNOSIS — I200 Unstable angina: Secondary | ICD-10-CM | POA: Diagnosis present

## 2020-10-22 DIAGNOSIS — Z8542 Personal history of malignant neoplasm of other parts of uterus: Secondary | ICD-10-CM | POA: Diagnosis not present

## 2020-10-22 DIAGNOSIS — I252 Old myocardial infarction: Secondary | ICD-10-CM

## 2020-10-22 DIAGNOSIS — I25110 Atherosclerotic heart disease of native coronary artery with unstable angina pectoris: Secondary | ICD-10-CM | POA: Diagnosis present

## 2020-10-22 DIAGNOSIS — R001 Bradycardia, unspecified: Secondary | ICD-10-CM | POA: Diagnosis present

## 2020-10-22 DIAGNOSIS — F410 Panic disorder [episodic paroxysmal anxiety] without agoraphobia: Secondary | ICD-10-CM | POA: Diagnosis not present

## 2020-10-22 DIAGNOSIS — F32A Depression, unspecified: Secondary | ICD-10-CM | POA: Diagnosis present

## 2020-10-22 DIAGNOSIS — Z800 Family history of malignant neoplasm of digestive organs: Secondary | ICD-10-CM | POA: Diagnosis not present

## 2020-10-22 DIAGNOSIS — Z8249 Family history of ischemic heart disease and other diseases of the circulatory system: Secondary | ICD-10-CM | POA: Diagnosis not present

## 2020-10-22 DIAGNOSIS — R0602 Shortness of breath: Secondary | ICD-10-CM | POA: Diagnosis not present

## 2020-10-22 DIAGNOSIS — Z888 Allergy status to other drugs, medicaments and biological substances status: Secondary | ICD-10-CM

## 2020-10-22 DIAGNOSIS — Z880 Allergy status to penicillin: Secondary | ICD-10-CM

## 2020-10-22 DIAGNOSIS — K625 Hemorrhage of anus and rectum: Secondary | ICD-10-CM | POA: Diagnosis present

## 2020-10-22 DIAGNOSIS — Z5329 Procedure and treatment not carried out because of patient's decision for other reasons: Secondary | ICD-10-CM | POA: Diagnosis not present

## 2020-10-22 DIAGNOSIS — E119 Type 2 diabetes mellitus without complications: Secondary | ICD-10-CM | POA: Diagnosis not present

## 2020-10-22 DIAGNOSIS — T82855A Stenosis of coronary artery stent, initial encounter: Secondary | ICD-10-CM | POA: Diagnosis not present

## 2020-10-22 DIAGNOSIS — Z720 Tobacco use: Secondary | ICD-10-CM | POA: Diagnosis present

## 2020-10-22 DIAGNOSIS — Z9221 Personal history of antineoplastic chemotherapy: Secondary | ICD-10-CM | POA: Diagnosis not present

## 2020-10-22 DIAGNOSIS — Z853 Personal history of malignant neoplasm of breast: Secondary | ICD-10-CM

## 2020-10-22 DIAGNOSIS — Z91030 Bee allergy status: Secondary | ICD-10-CM

## 2020-10-22 DIAGNOSIS — Z91013 Allergy to seafood: Secondary | ICD-10-CM

## 2020-10-22 DIAGNOSIS — Z820 Family history of epilepsy and other diseases of the nervous system: Secondary | ICD-10-CM | POA: Diagnosis not present

## 2020-10-22 DIAGNOSIS — I160 Hypertensive urgency: Secondary | ICD-10-CM | POA: Diagnosis not present

## 2020-10-22 DIAGNOSIS — I10 Essential (primary) hypertension: Secondary | ICD-10-CM | POA: Diagnosis not present

## 2020-10-22 DIAGNOSIS — K219 Gastro-esophageal reflux disease without esophagitis: Secondary | ICD-10-CM | POA: Diagnosis present

## 2020-10-22 DIAGNOSIS — Z91018 Allergy to other foods: Secondary | ICD-10-CM

## 2020-10-22 DIAGNOSIS — R0609 Other forms of dyspnea: Secondary | ICD-10-CM | POA: Diagnosis not present

## 2020-10-22 DIAGNOSIS — Z87891 Personal history of nicotine dependence: Secondary | ICD-10-CM

## 2020-10-22 DIAGNOSIS — R0600 Dyspnea, unspecified: Secondary | ICD-10-CM | POA: Diagnosis not present

## 2020-10-22 LAB — CBC
HCT: 42.2 % (ref 36.0–46.0)
Hemoglobin: 13.9 g/dL (ref 12.0–15.0)
MCH: 27.8 pg (ref 26.0–34.0)
MCHC: 32.9 g/dL (ref 30.0–36.0)
MCV: 84.4 fL (ref 80.0–100.0)
Platelets: 238 10*3/uL (ref 150–400)
RBC: 5 MIL/uL (ref 3.87–5.11)
RDW: 14.6 % (ref 11.5–15.5)
WBC: 6.5 10*3/uL (ref 4.0–10.5)
nRBC: 0 % (ref 0.0–0.2)

## 2020-10-22 LAB — COMPREHENSIVE METABOLIC PANEL
ALT: 14 U/L (ref 0–44)
AST: 21 U/L (ref 15–41)
Albumin: 3.6 g/dL (ref 3.5–5.0)
Alkaline Phosphatase: 112 U/L (ref 38–126)
Anion gap: 8 (ref 5–15)
BUN: 6 mg/dL (ref 6–20)
CO2: 23 mmol/L (ref 22–32)
Calcium: 9.3 mg/dL (ref 8.9–10.3)
Chloride: 108 mmol/L (ref 98–111)
Creatinine, Ser: 0.74 mg/dL (ref 0.44–1.00)
GFR, Estimated: >60 mL/min (ref >60)
Glucose, Bld: 110 mg/dL — ABNORMAL HIGH (ref 70–99)
Potassium: 3.7 mmol/L (ref 3.5–5.1)
Sodium: 139 mmol/L (ref 135–145)
Total Bilirubin: 0.8 mg/dL (ref 0.3–1.2)
Total Protein: 6.8 g/dL (ref 6.5–8.1)

## 2020-10-22 LAB — BASIC METABOLIC PANEL
Anion gap: 9 (ref 5–15)
BUN: 9 mg/dL (ref 6–20)
CO2: 25 mmol/L (ref 22–32)
Calcium: 9.6 mg/dL (ref 8.9–10.3)
Chloride: 105 mmol/L (ref 98–111)
Creatinine, Ser: 0.77 mg/dL (ref 0.44–1.00)
GFR, Estimated: >60 mL/min (ref >60)
Glucose, Bld: 100 mg/dL — ABNORMAL HIGH (ref 70–99)
Potassium: 3.8 mmol/L (ref 3.5–5.1)
Sodium: 139 mmol/L (ref 135–145)

## 2020-10-22 LAB — TROPONIN I (HIGH SENSITIVITY)
Troponin I (High Sensitivity): 5 ng/L (ref <18)
Troponin I (High Sensitivity): 6 ng/L (ref <18)

## 2020-10-22 LAB — SARS CORONAVIRUS 2 (TAT 6-24 HRS): SARS Coronavirus 2: NEGATIVE

## 2020-10-22 LAB — HIV ANTIBODY (ROUTINE TESTING W REFLEX): HIV Screen 4th Generation wRfx: NONREACTIVE

## 2020-10-22 LAB — BRAIN NATRIURETIC PEPTIDE: B Natriuretic Peptide: 33.8 pg/mL (ref 0.0–100.0)

## 2020-10-22 MED ORDER — ATORVASTATIN CALCIUM 40 MG PO TABS
40.0000 mg | ORAL_TABLET | Freq: Every day | ORAL | Status: DC
  Administered 2020-10-22 – 2020-10-23 (×2): 40 mg via ORAL
  Filled 2020-10-22 (×2): qty 1

## 2020-10-22 MED ORDER — SODIUM CHLORIDE 0.9 % WEIGHT BASED INFUSION
3.0000 mL/kg/h | INTRAVENOUS | Status: DC
  Administered 2020-10-23: 3 mL/kg/h via INTRAVENOUS

## 2020-10-22 MED ORDER — ISOSORBIDE MONONITRATE ER 60 MG PO TB24
90.0000 mg | ORAL_TABLET | Freq: Every day | ORAL | Status: DC
  Administered 2020-10-23: 90 mg via ORAL
  Filled 2020-10-22: qty 1

## 2020-10-22 MED ORDER — CARVEDILOL 6.25 MG PO TABS: 6.2500 mg | ORAL_TABLET | Freq: Two times a day (BID) | ORAL | Status: DC

## 2020-10-22 MED ORDER — ASPIRIN 81 MG PO CHEW
81.0000 mg | CHEWABLE_TABLET | ORAL | Status: AC
Start: 2020-10-23 — End: 2020-10-23
  Administered 2020-10-23: 81 mg via ORAL
  Filled 2020-10-22: qty 1

## 2020-10-22 MED ORDER — HEPARIN SODIUM (PORCINE) 5000 UNIT/ML IJ SOLN
5000.0000 [IU] | Freq: Three times a day (TID) | INTRAMUSCULAR | Status: DC
  Administered 2020-10-22 – 2020-10-23 (×2): 5000 [IU] via SUBCUTANEOUS
  Filled 2020-10-22 (×2): qty 1

## 2020-10-22 MED ORDER — SODIUM CHLORIDE 0.9% FLUSH
3.0000 mL | Freq: Two times a day (BID) | INTRAVENOUS | Status: DC
  Administered 2020-10-22 – 2020-10-23 (×2): 3 mL via INTRAVENOUS

## 2020-10-22 MED ORDER — SODIUM CHLORIDE 0.9 % WEIGHT BASED INFUSION: 1.0000 mL/kg/h | INTRAVENOUS | Status: DC

## 2020-10-22 MED ORDER — ASPIRIN EC 81 MG PO TBEC: 81.0000 mg | DELAYED_RELEASE_TABLET | Freq: Every day | ORAL | Status: DC

## 2020-10-22 MED ORDER — SENNOSIDES-DOCUSATE SODIUM 8.6-50 MG PO TABS: 1.0000 | ORAL_TABLET | Freq: Every evening | ORAL | Status: DC | PRN

## 2020-10-22 MED ORDER — SODIUM CHLORIDE 0.9% FLUSH: 3.0000 mL | INTRAVENOUS | Status: DC | PRN

## 2020-10-22 MED ORDER — METOPROLOL TARTRATE 25 MG PO TABS
25.0000 mg | ORAL_TABLET | Freq: Two times a day (BID) | ORAL | Status: DC
  Administered 2020-10-22: 25 mg via ORAL
  Filled 2020-10-22: qty 1

## 2020-10-22 MED ORDER — ASPIRIN 81 MG PO CHEW
324.0000 mg | CHEWABLE_TABLET | Freq: Once | ORAL | Status: AC
  Administered 2020-10-22: 324 mg via ORAL
  Filled 2020-10-22: qty 4

## 2020-10-22 MED ORDER — ACETAMINOPHEN 325 MG PO TABS: 650.0000 mg | ORAL_TABLET | Freq: Four times a day (QID) | ORAL | Status: DC | PRN

## 2020-10-22 MED ORDER — LABETALOL HCL 5 MG/ML IV SOLN: 5.0000 mg | INTRAVENOUS | Status: DC | PRN

## 2020-10-22 MED ORDER — SODIUM CHLORIDE 0.9 % IV SOLN: 250.0000 mL | INTRAVENOUS | Status: DC | PRN

## 2020-10-22 MED ORDER — ACETAMINOPHEN 650 MG RE SUPP: 650.0000 mg | Freq: Four times a day (QID) | RECTAL | Status: DC | PRN

## 2020-10-22 MED ORDER — AMLODIPINE BESYLATE 10 MG PO TABS
10.0000 mg | ORAL_TABLET | Freq: Every day | ORAL | Status: DC
  Administered 2020-10-22 – 2020-10-23 (×2): 10 mg via ORAL
  Filled 2020-10-22: qty 1
  Filled 2020-10-22: qty 2

## 2020-10-22 MED ORDER — LOSARTAN POTASSIUM 50 MG PO TABS
100.0000 mg | ORAL_TABLET | Freq: Every day | ORAL | Status: DC
  Administered 2020-10-22 – 2020-10-23 (×2): 100 mg via ORAL
  Filled 2020-10-22 (×2): qty 2

## 2020-10-22 NOTE — ED Triage Notes (Signed)
Patient complains of 1 week of chest pain and pressure with SOB with any exertion. Patient reports nausea and intermittent diaphoresis with same. Has seen cardiology pa and wants further evaluation. Patient alert and oriented, NAD

## 2020-10-22 NOTE — ED Provider Notes (Signed)
[facility] Provider Note   CSN: [id number] Arrival date & time: 10/22/20  1047     History No chief complaint on file.   Bethany Wang is a 55 y.o. female.  HPI   Pt was sent in by PMD for hx of known CAD and now having some ongoing CP SOB, and sweating - Bethany Wang ahs had a recent heart cath in July 2020 which showed some significant multivessel coronary disease, there had been some collaterals around stents that had reoccluded, the patient continues on Plavix.  Bethany Wang was unable to see Dr. Domenic Polite in the office due to a full schedule and was redirected to the emergency department because of the symptoms which become significantly worse for her including significant dyspnea on exertion.  Bethany Wang can barely walk 10 steps before Bethany Wang has to stop because of severe shortness of breath.  Bethany Wang reports this is the same thing that happened with her prior heart attacks.  Bethany Wang is not actually having symptoms when Bethany Wang is resting but it gets much worse when Bethany Wang tries to exert herself to the point where Bethany Wang does not want to get up.  No fevers chills nausea vomiting or diarrhea  Past Medical History:  Diagnosis Date   Anemia    Anxiety    Bilateral breast cysts 12/30/2015   Brain tumor ([place])    Breast cancer ([place])    Left Breast Cancer   Breast disorder    BV (bacterial vaginosis) 09/09/2015   Coronary atherosclerosis of native coronary artery    a. s/p DES to RCA in 2013 b. DES to LAD in 2014 c. cath in 04/2016 showing patent LAD stent with D2 jailed by LAD stent and CTO of RCA with left to right collaterals present   Cyst of pharynx or nasopharynx    [name]   Decreased libido 12/25/2018   Depression    Diabetes mellitus without complication ([place])    Essential hypertension    Falls    x 3-4 in past year   GERD (gastroesophageal reflux disease)    Headache    History of hematuria    Mixed hyperlipidemia    Personal history of chemotherapy     Left Breast Cancer   Personal history of radiation therapy    Left Breast Cancer   Pre-diabetes    PUD (peptic ulcer disease)    Seizures ([place])    Shingles 09/09/2015   Stroke ([place])    12-2017 on Plavix, only deficit is headaches   Suicide attempt ([place])    3 attempts in remote past   Trichimoniasis 08/20/2019   Treated 08/20/19    Uterine cancer ([place])    Vitamin D deficiency disease 04/03/2019    Patient Active Problem List   Diagnosis Date Noted   Loss of weight 05/20/2020   Abdominal pain 05/20/2020   Trichimoniasis 08/20/2019   Screening examination for STD (sexually transmitted disease) 08/15/2019   Vaginal itching 08/15/2019   Hypertension 08/15/2019   Vitamin D deficiency disease 04/03/2019   Decreased libido 12/25/2018   Angina pectoris ([place]) 10/25/2018   Palpitations 06/06/2018   Chest pain 07/13/2017   Nonspecific chest pain 07/13/2017   Mixed hyperlipidemia 07/13/2017   Precordial chest pain    Hypertensive urgency 06/15/2016   Unstable angina pectoris ([place]) 06/15/2016   Unstable angina ([place]) 06/15/2016   Bilateral breast cysts 12/30/2015   Vaginal discharge 09/09/2015   BV (bacterial vaginosis) 09/09/2015   Shingles 09/09/2015   History  of breast cancer 09/09/2015   Syncope and collapse 08/08/2015   Migraine with aura and with status migrainosus, not intractable 08/08/2015   Chronic low back pain 08/08/2015   Insomnia 08/08/2015   Anxiety state 08/08/2015   Panic attacks 08/08/2015   Anemia, iron deficiency 08/08/2015   Pica 08/08/2015   Angina decubitus ([place]) 07/03/2014   Vertebrobasilar artery syndrome 03/06/2014   Encounter for gynecological examination with Papanicolaou smear of cervix 02/04/2014   Syncope 12/14/2013   Cervical disc disorder with radiculopathy of cervical region 02/19/2013   H/O rotator cuff surgery 02/19/2013   Dyspareunia 02/01/2013   Left shoulder pain 01/24/2013   Rotator cuff tear 01/24/2013   Radicular pain 01/24/2013    Labral tear of shoulder 01/24/2013   Complex regional pain syndrome of upper extremity 01/24/2013   Herniated disc, cervical 01/24/2013   Nausea and vomiting 01/18/2013   Rectal bleeding 01/18/2013   Edema of left foot 10/16/2012   Coronary atherosclerosis of native coronary artery    Essential hypertension, benign    HLD (hyperlipidemia)    Tobacco abuse     Past Surgical History:  Procedure Laterality Date   ABDOMINAL HYSTERECTOMY     BIOPSY  11/28/2019   Procedure: BIOPSY;  Surgeon: Rogene Houston, MD;  Location: [facility];  Service: Endoscopy;;  antral, gastric body    BLADDER SURGERY     BREAST LUMPECTOMY Left    BREAST LUMPECTOMY Right 02/24/2018   Procedure: RIGHT BREAST LUMPECTOMY ERAS PATHWAY;  Surgeon: Jovita Kussmaul, MD;  Location: [facility];  Service: General;  Laterality: Right;   CARDIAC CATHETERIZATION N/A 05/10/2016   Procedure: Left Heart Cath and Coronary Angiography;  Surgeon: Belva Crome, MD;  Location: [facility];  Service: Cardiovascular;  Laterality: N/A;   CAUTERIZE INNER NOSE  07/13/2020   COLONOSCOPY  May 2010   Fleishman: normal rectum, internal hemorrhoids, , benign colonic polyp   COLONOSCOPY N/A 01/05/2017   Procedure: COLONOSCOPY;  Surgeon: Rogene Houston, MD;  [facility];  Service: Endoscopy;  Laterality: N/A;  1:00   ESOPHAGEAL DILATION  11/28/2019   Procedure: ESOPHAGEAL DILATION;  Surgeon: Rogene Houston, MD;  [facility];  Service: Endoscopy;;   ESOPHAGOGASTRODUODENOSCOPY     2001 Dr. Amedeo Plenty: distal esophagitis, small hiatal hernia,. Dr. Tamala Julian 2006? no records available currently, pt also reports  EGD a few years ago with Dr. Gala Romney, do not have these reports anywhere in medical records   ESOPHAGOGASTRODUODENOSCOPY  06/02/2011   GXQ:JJHERD pill impaction as described above s/p dilation of a probable cervical esophageal web/bx abnormal esophageal and gastric mucosa. + H.pylori gastritis     ESOPHAGOGASTRODUODENOSCOPY (EGD) WITH PROPOFOL N/A 11/28/2019   Procedure: ESOPHAGOGASTRODUODENOSCOPY (EGD) WITH PROPOFOL;  Surgeon: Rogene Houston, MD;  [facility];  Service: Endoscopy;  Laterality: N/A;  1020   EYE SURGERY     Removed glass   HAND SURGERY Right    Left breast lumpectomy     Benign   LEFT HEART CATH AND CORONARY ANGIOGRAPHY N/A 10/25/2018   Procedure: LEFT HEART CATH AND CORONARY ANGIOGRAPHY;  Surgeon: Wellington Hampshire, MD;  Location: [facility];  Service: Cardiovascular;  Laterality: N/A;   LEFT HEART CATHETERIZATION WITH CORONARY ANGIOGRAM N/A 07/03/2014   Procedure: LEFT HEART CATHETERIZATION WITH CORONARY ANGIOGRAM;  Surgeon: Belva Crome, MD;  Location: [facility];  Service: Cardiovascular;  Laterality: N/A;   POLYPECTOMY  01/05/2017   Procedure: POLYPECTOMY;  Surgeon: Rogene Houston, MD;  [facility];  Service: Endoscopy;;  colon   SHOULDER SURGERY Left      OB History     Gravida  4   Para  3   Term      Preterm      AB  1   Living  3      SAB  1   IAB      Ectopic      Multiple      Live Births              Family History  Problem Relation Age of Onset   Colon cancer Paternal Grandfather    Cancer Father    Cancer Maternal Uncle    Alzheimer's disease Paternal Aunt    Cirrhosis Maternal Uncle    Cancer Paternal Aunt        lung   Aneurysm Mother        brain   Heart disease Brother    Heart attack Brother    Mental illness Daughter    ADD / ADHD Daughter    Bipolar disorder Daughter    Mental illness Son    ADD / ADHD Son    Bipolar disorder Son    Mental illness Son    ADD / ADHD Son    Bipolar disorder Son     Social History   Tobacco Use   Smoking status: Former    Packs/day: 0.20    Years: 32.00    Pack years: 6.40    Types: Cigarettes, Cigars    Start date: 07/05/1983    Quit date: 04/03/2012    Years since quitting: 8.5   Smokeless tobacco: Never  Vaping Use   Vaping  Use: Never used  Substance Use Topics   Alcohol use: No    Alcohol/week: 0.0 standard drinks   Drug use: No    Home Medications Prior to Admission medications   Medication Sig Start Date End Date Taking? Authorizing Provider  ALPRAZolam Duanne Moron) 0.5 MG tablet Take 1 tablet (0.5 mg total) by mouth 2 (two) times daily as needed for anxiety. 09/02/20   [name], MD  amLODipine (NORVASC) 10 MG tablet Take 1 tablet (10 mg total) by mouth daily. 10/21/20   [name], MD  aspirin EC 81 MG tablet Take 1 tablet (81 mg total) by mouth daily. 07/16/20   Strader, Fransisco Hertz, PA-C  atorvastatin (LIPITOR) 40 MG tablet Take 1 tablet (40 mg total) by mouth daily at 6 PM. 07/16/20   Strader, Fransisco Hertz, PA-C  clopidogrel (PLAVIX) 75 MG tablet Take 1 tablet by mouth daily. Patient not taking: Reported on 09/22/2020 09/09/20   [provider]  Colchicine (MITIGARE) 0.6 MG CAPS Take 1 capsule by mouth 2 (two) times daily as needed. 08/19/20   Hilts, Legrand Como, MD  escitalopram (LEXAPRO) 20 MG tablet Take 1 tablet (20 mg total) by mouth daily. 10/21/20   [name], MD  fluorometholone (FML) 0.1 % ophthalmic suspension SMARTSIG:In Eye(s) 08/21/20   [provider]  fluticasone (FLONASE) 50 MCG/ACT nasal spray Place into both nostrils. 09/11/20   [provider]  fluticasone-salmeterol (ADVAIR) 100-50 MCG/ACT AEPB INHALE 1 PUFF BY MOUTH TWICE DAILY 08/25/20   Hurshel Party C, MD  isosorbide mononitrate (IMDUR) 30 MG 24 hr tablet Take 90 mg by mouth daily. 09/09/20   [provider]  LINZESS 145 MCG CAPS capsule Take 1 capsule (145 mcg total)  by mouth daily. 10/21/20   [name], MD  losartan (COZAAR) 100 MG tablet Take 1 tablet by mouth daily. 09/09/20   [provider]  methylPREDNISolone (MEDROL DOSEPAK) 4 MG TBPK tablet As directed for 6 days. Patient not taking: Reported on 09/22/2020 08/19/20   Hilts, Legrand Como, MD  metoprolol tartrate (LOPRESSOR) 25 MG  tablet Take 1 tablet (25 mg total) by mouth 2 (two) times daily. 07/16/20   Strader, Fransisco Hertz, PA-C  nitroGLYCERIN (NITROSTAT) 0.4 MG SL tablet DISSOLVE 1 TABLET UNDER THE TONGUE AS NEEDED FOR CHEST PAIN EVERY 5 MINUTES UP TO 3 TIMES. IF NO RELIEF CALL 911. 09/05/20   Satira Sark, MD  nystatin (MYCOSTATIN) 100000 UNIT/ML suspension Take 5 mLs (500,000 Units total) by mouth 4 (four) times daily. 09/03/20   [name], MD  pantoprazole (PROTONIX) 40 MG tablet TAKE ONE (1) TABLET BY MOUTH TWICE DAILY BEFORE A MEAL 07/30/20   Hurshel Party C, MD  prednisoLONE acetate (PRED FORTE) 1 % ophthalmic suspension SMARTSIG:4 Drop(s) In Ear(s) Twice Daily PRN Patient not taking: Reported on 09/22/2020 09/09/20   [provider]  predniSONE (DELTASONE) 20 MG tablet Take 2 tablets (40 mg total) by mouth daily with breakfast. 09/22/20   [name], MD    Allergies    Bee venom, Penicillins, Shellfish allergy, Tomato, and Zofran [ondansetron]  Review of Systems   Review of Systems  All other systems reviewed and are negative.  Physical Exam Updated Vital Signs BP (!) 210/116   Pulse (!) 54   Temp 98.1 F (36.7 C)   Resp 13   Ht 1.626 m (5\' 4" )   Wt 66.2 kg   SpO2 100%   BMI 25.06 kg/m   Physical Exam Vitals and nursing note reviewed.  Constitutional:      General: Bethany Wang is not in acute distress.    Appearance: Bethany Wang is well-developed.  HENT:     Head: Normocephalic and atraumatic.     Mouth/Throat:     Pharynx: No oropharyngeal exudate.  Eyes:     General: No scleral icterus.       Right eye: No discharge.        Left eye: No discharge.     Conjunctiva/sclera: Conjunctivae normal.     Pupils: Pupils are equal, round, and reactive to light.  Neck:     Thyroid: No thyromegaly.     Vascular: No JVD.  Cardiovascular:     Rate and Rhythm: Normal rate and regular rhythm.     Heart sounds: Normal heart sounds. No murmur heard.   No friction rub. No gallop.  Pulmonary:      Effort: Pulmonary effort is normal. No respiratory distress.     Breath sounds: Normal breath sounds. No wheezing or rales.  Abdominal:     General: Bowel sounds are normal. There is no distension.     Palpations: Abdomen is soft. There is no mass.     Tenderness: There is no abdominal tenderness.  Musculoskeletal:        General: No tenderness. Normal range of motion.     Cervical back: Normal range of motion and neck supple.     Right lower leg: No edema.     Left lower leg: No edema.  Lymphadenopathy:     Cervical: No cervical adenopathy.  Skin:    General: Skin is warm and dry.     Findings: No erythema or rash.  Neurological:     Mental Status:  Bethany Wang is alert.     Coordination: Coordination normal.  Psychiatric:        Behavior: Behavior normal.    ED Results / Procedures / Treatments   Labs (all labs ordered are listed, but only abnormal results are displayed) Labs Reviewed  BASIC METABOLIC PANEL - Abnormal; Notable for the following components:      Result Value   Glucose, Bld 100 (*)    All other components within normal limits  CBC  BRAIN NATRIURETIC PEPTIDE  TROPONIN I (HIGH SENSITIVITY)  TROPONIN I (HIGH SENSITIVITY)    EKG EKG Interpretation  Date/Time:  Wednesday October 22 2020 10:57:39 EDT Ventricular Rate:  71 PR Interval:  148 QRS Duration: 86 QT Interval:  408 QTC Calculation: 443 R Axis:   54 Text Interpretation: Sinus rhythm with occasional Premature ventricular complexes Minimal voltage criteria for LVH, may be normal variant ( Sokolow-Lyon ) Anterior infarct , age undetermined Abnormal ECG since last tracing no significant change Confirmed by Noemi Chapel [phone]) on 10/22/2020 11:44:05 AM  Radiology DG Chest 2 View  Result Date: 10/22/2020 CLINICAL DATA:  Chest pain, shortness of breath EXAM: CHEST - 2 VIEW COMPARISON:  12/25/2019 FINDINGS: Heart and mediastinal contours are within normal limits. No focal opacities or effusions. No acute bony  abnormality. IMPRESSION: No active cardiopulmonary disease. Electronically Signed   By: Rolm Baptise M.D.   On: 10/22/2020 11:14    Procedures Procedures   Medications Ordered in ED Medications  amLODipine (NORVASC) tablet 10 mg (has no administration in time range)  losartan (COZAAR) tablet 100 mg (has no administration in time range)  metoprolol tartrate (LOPRESSOR) tablet 25 mg (has no administration in time range)  aspirin chewable tablet 324 mg (324 mg Oral Given 10/22/20 1308)    ED Course  I have reviewed the triage vital signs and the nursing notes.  Pertinent labs & imaging results that were available during my care of the patient were reviewed by me and considered in my medical decision making (see chart for details).    MDM Rules/Calculators/A&P                          The patient's exam is rather unremarkable, Bethany Wang appears to be a little bit short of breath but has no other abnormal lung sounds or heart sounds.  Normal pulses, no edema, no JVD.  Her EKG does not show acute ischemia there is LVH.  Her chest x-ray shows no acute findings.  Troponin pending, anticipate discussing with cardiology due to the patient's significant risk  Thankfully the labs show no elevation in troponin, the metabolic panel shows no abnormalities, the CBC shows no abnormalities, the chest x-ray shows no abnormalities, the EKG shows LVH but no other findings.  I discussed the case with the cardiology service, Wannetta Sender will coordinate the patient being evaluated by cardiology  Final Clinical Impression(s) / ED Diagnoses Final diagnoses:  Unstable angina [facility])    Rx / DC Orders ED Discharge Orders     None        Noemi Chapel, MD 10/22/20 1329

## 2020-10-22 NOTE — Telephone Encounter (Signed)
Sam, I really appreciate you trying to work with this patient and I was not aware that you are down physicians since the departure of Dr. Raliegh Ip.  Thank you once again.

## 2020-10-22 NOTE — Telephone Encounter (Signed)
Mrs. Doshia Dalia walked in today upset and concerned. She is wanting is stating she has been having extreme fatigue, shortness of breathe, with walking on her porch to in the house  or to the car she is stating she is just given out. Haven' seen Dr Domenic Polite in 2 yrs. She said how is it she cant see him. She wants him or someone that is a MD who will see her in heart care. Pt said I am taking all my medicine, plants, gardening, house work. But she know she is not her best. The last time she had procedure done on heart is the way she is feeling.  I don't want to come in to see NP's . She is afraid that if she does not have time or too many patients on my health. I explained is the specialist offices and all medical providers. Pt was are important to all of medical staff. Especially the providers for [facility]. Please let me send a note to Dr Gwen Her & Dr [name] to see what they can come up with to help treat her concerns. Pt said she will see anyone at heat care today on this week if they will need her to go to [facility] she will. No to [place] office now.

## 2020-10-22 NOTE — H&P (Addendum)
Cardiology Admission History and Physical:   Patient ID: Bethany Wang MRN: [id number]; DOB: [birth year]   Admission date: 10/22/2020  PCP:  [name], MD   [facility] Providers Cardiologist:  [name], MD        Chief Complaint: Chest pain, shortness of breath  Patient Profile:   Bethany Wang is a 55 y.o. female with PMH of CAD (s/p DES to RCA in 2013 b. DES to LAD in 2014 c. cath in 04/2016 showing patent LAD stent with D2 jailed by LAD stent and CTO of RCA with L to R collaterals, cath in 10/2018 with patent LAD stent and jailed D2), uncontrolled HTN, HLD, palpitations, TIA and breast cancer (s/p lumpectomy) and MI who is being seen 10/22/2020 for the evaluation of chest pain and shortness of breath.  History of Present Illness:   Bethany Wang is a 55 year old lady with significant cardiac history who presents for evaluation of progressive shortness of breath and chest pain.  Patient reports that since Saturday, she has not been feeling well.  She reports increased dyspnea on exertion to the point where she she has difficulty walking to her mailbox without stopping to rest.  She has had pressure-like chest pain that is worse with deep inspiration.  She reports associated sweats, fatigue and having "brain freeze".  She denies any PND but endorses orthopnea stating that she sleeps on 4 pillows.  She has also had intermittent lower extremity swelling but denies any nausea, vomiting, abdominal pain. Patient also reports her blood pressure has been elevated with SBP in the 170s to 180s at home.  BP was found to be elevated with SBP in the 200s at her primary care doctor's office today. She has not worsening blurred vision since Saturday but denies any focal weakness.  Patient reports she has been frustrated with her cardiologist, Dr. Domenic Polite since she has not been able to see him in the last 2 years. States her current symptoms are similar to when she had her previous  heart attacks and strokes.  Patient last saw cardiology [facility] in April of this year and was restarted on her aspirin, and atorvastatin and metoprolol. She was sent to the ED today by her PCP because Dr. Myles Gip schedule was full.    Past Medical History:  Diagnosis Date   Anemia    Anxiety    Bilateral breast cysts 12/30/2015   Brain tumor ([place])    Breast cancer ([place])    Left Breast Cancer   Breast disorder    BV (bacterial vaginosis) 09/09/2015   Coronary atherosclerosis of native coronary artery    a. s/p DES to RCA in 2013 b. DES to LAD in 2014 c. cath in 04/2016 showing patent LAD stent with D2 jailed by LAD stent and CTO of RCA with left to right collaterals present   Cyst of pharynx or nasopharynx    [name]   Decreased libido 12/25/2018   Depression    Diabetes mellitus without complication ([place])    Essential hypertension    Falls    x 3-4 in past year   GERD (gastroesophageal reflux disease)    Headache    History of hematuria    Mixed hyperlipidemia    Personal history of chemotherapy    Left Breast Cancer   Personal history of radiation therapy    Left Breast Cancer   Pre-diabetes    PUD (peptic ulcer disease)    Seizures ([place])    Shingles  09/09/2015   Stroke ([place])    12-2017 on Plavix, only deficit is headaches   Suicide attempt ([place])    3 attempts in remote past   Trichimoniasis 08/20/2019   Treated 08/20/19    Uterine cancer ([place])    Vitamin D deficiency disease 04/03/2019    Past Surgical History:  Procedure Laterality Date   ABDOMINAL HYSTERECTOMY     BIOPSY  11/28/2019   Procedure: BIOPSY;  Surgeon: Rogene Houston, MD;  [facility];  Service: Endoscopy;;  antral, gastric body    BLADDER SURGERY     BREAST LUMPECTOMY Left    BREAST LUMPECTOMY Right 02/24/2018   Procedure: RIGHT BREAST LUMPECTOMY ERAS PATHWAY;  Surgeon: Jovita Kussmaul, MD;  Location: [place];  Service: General;  Laterality: Right;   CARDIAC CATHETERIZATION  N/A 05/10/2016   Procedure: Left Heart Cath and Coronary Angiography;  Surgeon: Belva Crome, MD;  Location: [place] [facility];  Service: Cardiovascular;  Laterality: N/A;   CAUTERIZE INNER NOSE  07/13/2020   COLONOSCOPY  May 2010   [place]le[place]man: normal rectum, internal hemorrhoids, , benign colonic polyp   COLONOSCOPY N/A 01/05/2017   Procedure: COLONOSCOPY;  Surgeon: Rogene Houston, MD;  [facility];  Service: Endoscopy;  Laterality: N/A;  1:00   ESOPHAGEAL DILATION  11/28/2019   Procedure: ESOPHAGEAL DILATION;  Surgeon: Rogene Houston, MD;  [facility];  Service: Endoscopy;;   ESOPHAGOGASTRODUODENOSCOPY     2001 Dr. Amedeo Plenty: distal esophagitis, small hiatal hernia,. Dr. Tamala Julian 2006? no records available currently, pt also reports  EGD a few years ago with Dr. Gala Romney, do not have these reports anywhere in medical records   ESOPHAGOGASTRODUODENOSCOPY  06/02/2011   DUK:GURKYH pill impaction as described above s/p dilation of a probable cervical esophageal web/bx abnormal esophageal and gastric mucosa. + H.pylori gastritis    ESOPHAGOGASTRODUODENOSCOPY (EGD) WITH PROPOFOL N/A 11/28/2019   Procedure: ESOPHAGOGASTRODUODENOSCOPY (EGD) WITH PROPOFOL;  Surgeon: Rogene Houston, MD;  [facility];  Service: Endoscopy;  Laterality: N/A;  1020   EYE SURGERY     Removed glass   HAND SURGERY Right    Left breast lumpectomy     Benign   LEFT HEART CATH AND CORONARY ANGIOGRAPHY N/A 10/25/2018   Procedure: LEFT HEART CATH AND CORONARY ANGIOGRAPHY;  Surgeon: Wellington Hampshire, MD;  Location: [place] [facility];  Service: Cardiovascular;  Laterality: N/A;   LEFT HEART CATHETERIZATION WITH CORONARY ANGIOGRAM N/A 07/03/2014   Procedure: LEFT HEART CATHETERIZATION WITH CORONARY ANGIOGRAM;  Surgeon: Belva Crome, MD;  Location: [facility];  Service: Cardiovascular;  Laterality: N/A;   POLYPECTOMY  01/05/2017   Procedure: POLYPECTOMY;  Surgeon: Rogene Houston, MD;  [facility];  Service: Endoscopy;;  colon   SHOULDER SURGERY Left      Medications Prior to Admission: Prior to Admission medications   Medication Sig Start Date End Date Taking? Authorizing Provider  ALPRAZolam Duanne Moron) 0.5 MG tablet Take 1 tablet (0.5 mg total) by mouth 2 (two) times daily as needed for anxiety. 09/02/20  Yes Gosrani, Nimish C, MD  amLODipine (NORVASC) 10 MG tablet Take 1 tablet (10 mg total) by mouth daily. 10/21/20  Yes Gosrani, Nimish C, MD  aspirin EC 81 MG tablet Take 1 tablet (81 mg total) by mouth daily. 07/16/20  Yes Strader, [place] M, PA-C  atorvastatin (LIPITOR) 40 MG tablet Take 1 tablet (40 mg total) by mouth daily at 6 PM. Patient  taking differently: Take 40 mg by mouth daily. 07/16/20  Yes Strader, [place] M, PA-C  isosorbide mononitrate (IMDUR) 30 MG 24 hr tablet Take 90 mg by mouth daily. 09/09/20  Yes [provider]  losartan (COZAAR) 100 MG tablet Take 100 mg by mouth daily. 09/09/20  Yes [provider]  metoprolol tartrate (LOPRESSOR) 25 MG tablet Take 1 tablet (25 mg total) by mouth 2 (two) times daily. 07/16/20  Yes Strader, Fransisco Hertz, PA-C  Colchicine (MITIGARE) 0.6 MG CAPS Take 1 capsule by mouth 2 (two) times daily as needed. Patient not taking: Reported on 10/22/2020 08/19/20   Hilts, Legrand Como, MD  escitalopram (LEXAPRO) 20 MG tablet Take 1 tablet (20 mg total) by mouth daily. Patient not taking: Reported on 10/22/2020 10/21/20   [name], MD  fluorometholone (FML) 0.1 % ophthalmic suspension SMARTSIG:In Eye(s) Patient not taking: Reported on 10/22/2020 08/21/20   [provider]  fluticasone-salmeterol (ADVAIR) 100-50 MCG/ACT AEPB INHALE 1 PUFF BY MOUTH TWICE DAILY Patient not taking: Reported on 10/22/2020 08/25/20   [name], MD  LINZESS 145 MCG CAPS capsule Take 1 capsule (145 mcg total) by mouth daily. Patient not taking: Reported on 10/22/2020 10/21/20   [name], MD  methylPREDNISolone (MEDROL DOSEPAK) 4  MG TBPK tablet As directed for 6 days. Patient not taking: No sig reported 08/19/20   Hilts, Michael, MD  nitroGLYCERIN (NITROSTAT) 0.4 MG SL tablet DISSOLVE 1 TABLET UNDER THE TONGUE AS NEEDED FOR CHEST PAIN EVERY 5 MINUTES UP TO 3 TIMES. IF NO RELIEF CALL 911. Patient taking differently: Place 0.4 mg under the tongue See admin instructions. DISSOLVE 1 TABLET UNDER THE TONGUE AS NEEDED FOR CHEST PAIN EVERY 5 MINUTES UP TO 3 TIMES. IF NO RELIEF CALL 911 09/05/20   Satira Sark, MD  nystatin (MYCOSTATIN) 100000 UNIT/ML suspension Take 5 mLs (500,000 Units total) by mouth 4 (four) times daily. Patient not taking: Reported on 10/22/2020 09/03/20   [name], MD  pantoprazole (PROTONIX) 40 MG tablet TAKE ONE (1) TABLET BY MOUTH TWICE DAILY BEFORE A MEAL Patient not taking: Reported on 10/22/2020 07/30/20   [name], MD  prednisoLONE acetate (PRED FORTE) 1 % ophthalmic suspension SMARTSIG:4 Drop(s) In Ear(s) Twice Daily PRN Patient not taking: No sig reported 09/09/20   [provider]  predniSONE (DELTASONE) 20 MG tablet Take 2 tablets (40 mg total) by mouth daily with breakfast. Patient not taking: Reported on 10/22/2020 09/22/20   [name], MD     Allergies:    Allergies  Allergen Reactions   Bee Venom Anaphylaxis and Other (See Comments)    "throat swelled and had to the hospital" - Yellow jacket sting   Penicillins Anaphylaxis and Other (See Comments)    Has patient had a PCN reaction causing immediate rash, facial/tongue/throat swelling, SOB or lightheadedness with hypotension: Yes Has patient had a PCN reaction causing severe rash involving mucus membranes or skin necrosis: Yes Has patient had a PCN reaction that required hospitalization Yes Has patient had a PCN reaction occurring within the last 10 years: Yes If all of the above answers are "NO", then may proceed with Cephalosporin use. Patient states she carries an epi-pen   Shellfish Allergy Swelling    Tomato Itching and Swelling   Zofran [Ondansetron] Itching    Social History:   Social History   Socioeconomic History   Marital status: Married    Spouse name: Saralyn Pilar   Number of children: 3  Years of education: Assoc   Highest education level: Not on file  Occupational History   Occupation: Retired    Comment: disability  Tobacco Use   Smoking status: Former    Packs/day: 0.20    Years: 32.00    Pack years: 6.40    Types: Cigarettes, Cigars    Start date: 07/05/1983    Quit date: 04/03/2012    Years since quitting: 8.5   Smokeless tobacco: Never  Vaping Use   Vaping Use: Never used  Substance and Sexual Activity   Alcohol use: No    Alcohol/week: 0.0 standard drinks   Drug use: No   Sexual activity: Yes    Birth control/protection: Surgical    Comment: hyst  Other Topics Concern   Not on file  Social History Narrative   Patient lives at new home with husband and extended family..On disability since age 70 yrs.   Social Determinants of Health   Financial Resource Strain: Low Risk    Difficulty of Paying Living Expenses: Not hard at all  Food Insecurity: No Food Insecurity   Worried About Charity fundraiser in the Last Year: Never true   [place] in the Last Year: Never true  Transportation Needs: No Transportation Needs   Lack of Transportation (Medical): No   Lack of Transportation (Non-Medical): No  Physical Activity: Inactive   Days of Exercise per Week: 0 days   Minutes of Exercise per Session: 0 min  Stress: No Stress Concern Present   Feeling of Stress : Not at all  Social Connections: Socially Integrated   Frequency of Communication with Friends and Family: More than three times a week   Frequency of Social Gatherings with Friends and Family: More than three times a week   Attends Religious Services: More than 4 times per year   Active Member of [employer] or Organizations: Yes   Attends Music therapist: More than 4 times per year    Marital Status: Married  Human resources officer Violence: Not At Risk   Fear of Current or Ex-Partner: No   Emotionally Abused: No   Physically Abused: No   Sexually Abused: No    Family History:   The patient's family history includes ADD / ADHD in her daughter, son, and son; Alzheimer's disease in her paternal aunt; Aneurysm in her mother; Bipolar disorder in her daughter, son, and son; Cancer in her father, maternal uncle, and paternal aunt; Cirrhosis in her maternal uncle; Colon cancer in her paternal grandfather; Heart attack in her brother; Heart disease in her brother; Mental illness in her daughter, son, and son.    ROS:  Please see the history of present illness.  All other ROS reviewed and negative.     Physical Exam/Data:   Vitals:   10/22/20 1615 10/22/20 1630 10/22/20 1645 10/22/20 1700  BP: (!) 148/69 (!) 157/75 (!) 157/95 (!) 175/114  Pulse: (!) 49 (!) 45 (!) 27 (!) 50  Resp: 13 14 20 15   Temp:      SpO2: 98% 100% 98% 99%  Weight:      Height:       No intake or output data in the 24 hours ending 10/22/20 1509 Last 3 Weights 10/22/2020 09/22/2020 09/03/2020  Weight (lbs) 146 lb 155 lb 9.6 oz 152 lb 6.4 oz  Weight (kg) 66.225 kg 70.58 kg 69.128 kg     Body mass index is 25.06 kg/m.  General:  Well nourished, well developed, in no  acute distress HEENT: normal Lymph: no adenopathy Neck: no JVD Endocrine:  No thryomegaly Vascular: No carotid bruits; FA pulses 2+ bilaterally without bruits  Cardiac:  normal S1, S2; RRR; no murmur  Lungs: Clear to auscultation bilaterally, no wheezing, rhonchi. Decreased air movement at the bases. Abd: soft, nontender, no hepatomegaly  Ext: no edema Musculoskeletal:  No deformities, BUE and BLE strength normal and equal Skin: warm and dry  Neuro:  A&Ox3. Moves all extremities. Strength 5/5 in all extremities.  Chronic decreased sensation in all extremities. Psych:  Normal mood and affect   EKG: The ECG that was done 7/13 was  personally reviewed and demonstrates normal sinus with, LVH and occasional PVCs.  Relevant CV Studies: MyoView 03/2014: No ischemia PCI 2013: DES to RCA PCI 2014: DES to LAD [facility] 04/2016: Patent LAD stent with D2 jailed by LAD stent and CTO of RCA with L to R collaterals Holter Monitor 05/2018: Sinus rhythm, rare PACs and PVCs, ventricular bigeminy, brief NSVT.  LHC 10/2018: Patent LAD stent, jailed D2 and moderate OM1 disease. ECHO 10/2018: EF 60-65%, moderate LVH, mod AR Long Term Monitoring 08/2020: Sinus rhythm, rare PVCs and PACs, brief run of NSVT and PSVT. No sustained arrhythmia  Laboratory Data:  High Sensitivity Troponin:   Recent Labs  Lab 10/22/20 1101 10/22/20 1315  TROPONINIHS 5 6      Chemistry Recent Labs  Lab 10/22/20 1101  NA 139  K 3.8  CL 105  CO2 25  GLUCOSE 100*  BUN 9  CREATININE 0.77  CALCIUM 9.6  GFRNONAA >60  ANIONGAP 9    No results for input(s): PROT, ALBUMIN, AST, ALT, ALKPHOS, BILITOT in the last 168 hours. Hematology Recent Labs  Lab 10/22/20 1101  WBC 6.5  RBC 5.00  HGB 13.9  HCT 42.2  MCV 84.4  MCH 27.8  MCHC 32.9  RDW 14.6  PLT 238   BNP Recent Labs  Lab 10/22/20 1315  BNP 33.8    DDimer No results for input(s): DDIMER in the last 168 hours.   Radiology/Studies:  DG Chest 2 View  Result Date: 10/22/2020 CLINICAL DATA:  Chest pain, shortness of breath EXAM: CHEST - 2 VIEW COMPARISON:  12/25/2019 FINDINGS: Heart and mediastinal contours are within normal limits. No focal opacities or effusions. No acute bony abnormality. IMPRESSION: No active cardiopulmonary disease. Electronically Signed   By: Rolm Baptise M.D.   On: 10/22/2020 11:14     Assessment and Plan:  Bethany Wang is a 55 y.o. female with PMH of CAD (s/p DES to RCA in 2013 b. DES to LAD in 2014 c. cath in 04/2016 showing patent LAD stent with D2 jailed by LAD stent and CTO of RCA with L to R collaterals, cath in 10/2018 with patent LAD stent and jailed D2),  uncontrolled HTN, HLD, palpitations, TIA and breast cancer (s/p lumpectomy) and MI who is being seen 10/22/2020 for the evaluation of chest pain and shortness of breath.   Chest pain, unstable angina: Patient with significant cardiac history here with progressive dyspnea on exertion and pleuritic chest pain. Troponins negative x2. EKG with no ST or T wave abnormalities. CXR unremarkable. Patient continues to complain of  dyspnea and chest tightness most notably on minimal exertion (even exacerbated by having an agitated conversation - making it hard for her to breath). In the setting of patient's cardiac history, severely elevated BP and current symptoms, she will require an inpatient ischemic evaluation  to determine if her Sx are related  to progression of CAD -Progressive/Unstable Angina vs. Related to Accelerated HTN with HFpEF & CP related to endocardial demand ischemia.  --S/p ASA 324 mg x1 dose - then 81 mg dialy  --Will plan for LHC-Cors +/- PCI tomorrow --PRN nitro for CP --Tele --Repeat EKG for new CP  2v-CAD, HLD: Multivessel disease with placement of multiple stents. Last heart cath in 2020 showed patent LAD stent and jailed D2. Patient has not been on consistent aggressive medical therapy.  Currently on aspirin and Lipitor. --LHC to re-assess coronary --Continue home ASA 81 mg and Lipitor 40 mg daily On Convert lopressor to carvedilol - better bp control  Severe hypertension: Patient with a history of uncontrolled BP and is usually 170-180s/100-110s at home. Home meds includes Imdur 90 mg daily, amlodipine 10 mg, Lopressor 25 mg BID, losartan 100 mg daily. Found to have SBP over 200 at her PCP today. She has had increased blurry vision, SOB and pleuritic CP since Saturday. Patient will need optimal BP control to help manage symptoms.   --Switch metoprolol 25 mg BID to coreg 6.25 mg BID --Continue home losartan, Amlodipine, Imdur --Slowly decrease BP --PRN labetalol for SBP >  200  HFpEF: Last echo in 2020 showed EF 60-65%, moderate LVH, mod AR.  Patient here with dyspnea on exertion with associated chest tightness and fatigue. She reports sleeping on 4 pillows and intermittent leg swelling but denies any PND.  There is decreased breath sounds on exam but no LE edema or JVD. BNP wnl. CXR w/o pleural effusion. Patient not fluid overloaded on exam.  On ARB & BB but will convert to carvedilol for better BP control along with amlodipine  --Repeat Echo --Strict I&Os --Daily weights Consider adding spironolactone prior to d/c depending on Cath-Cor Angio & LVEDP findings.   GERD: Stable. Reports not taking home Protonix 40 mg daily Anxiety, depression: Stable.    Risk Assessment/Risk Scores:    TIMI Risk Score for Unstable Angina or Non-ST Elevation MI:   The patient's TIMI risk score is 4 , which indicates a 20 % risk of all cause mortality, new or recurrent myocardial infarction or need for urgent revascularization in the next 14 days.{  New York Heart Association (NYHA) Functional Class NYHA Class III   Severity of Illness: The appropriate patient status for this patient is INPATIENT. Inpatient status is judged to be reasonable and necessary in order to provide the required intensity of service to ensure the patient's safety. The patient's presenting symptoms, physical exam findings, and initial radiographic and laboratory data in the context of their chronic comorbidities is felt to place them at high risk for further clinical deterioration. Furthermore, it is not anticipated that the patient will be medically stable for discharge from the hospital within 2 midnights of admission. The following factors support the patient status of inpatient.   " The patient's presenting symptoms include  Chest pain, SOB, DOE.  " The worrisome physical exam findings include decreased lung sounds. " The initial radiographic and laboratory data are worrisome because of pending  ECHO. " The chronic co-morbidities include uncontrolled HTN, CAD, TIA, breast cancer.   * I certify that at the point of admission it is my clinical judgment that the patient will require inpatient hospital care spanning beyond 2 midnights from the point of admission due to high intensity of service, high risk for further deterioration and high frequency of surveillance required.*   For questions or updates, please contact [place] Please consult [email or web address] for contact  info under     Signed, Lacinda Axon, MD  10/22/2020 3:09 PM     ATTENDING ATTESTATION  I have seen, examined and evaluated the patient this PM in the ER along with Dr. Coy Saunas (R2).  After reviewing all the available data and chart, we discussed the patients laboratory, study & physical findings as well as symptoms in detail. I agree with his findings, examination as well as impression recommendations as per our discussion.    Attending adjustments noted in italics.   Difficult situation where Ms. Aline Brochure has had this ongoing episodes of palpitations and near syncopal spell that apparently been evaluated with a monitor not show anything.  But she is also now complaining  Of significant exertional dyspnea and chest tightness associated now with even minimal activity including walking to the bathroom.  At this point think we are obligated to exclude ACS/unstable angina versus microvascular disease in the setting of HFpEF and accelerated hypertension.  We will titrate blood pressure medications while she is here, monitor on telemetry. Follow-up troponin levels, and if elevated will consider adding heparin however troponins seem relatively flat today.  Check 2D echo just to ensure that EF is still stable and good sense of her volume status.  We will also determine body status by LVEDP.  Assessment: CAD with Progressive (Unstable) Angina vs. Acclerated HTN with A on C HFpEF. ~ near syncope / drop  attacks.  Plan 2D Echo , BP control (convert BB to Coreg - titrate as needed, consider spironolactone Follow tele - LCP-Cors +/- PCI --> Shared Decision Making/Informed Consent{ The risks [stroke (1 in 1000), death (1 in 1000), kidney failure [usually temporary] (1 in 500), bleeding (1 in 200), allergic reaction [possibly serious] (1 in 200)], benefits (diagnostic support and management of coronary artery disease) and alternatives of a cardiac catheterization were discussed in detail with Ms. Nathanson and she is willing to proceed.     Glenetta Hew, M.D., M.S. Interventional Cardiologist   Pager # [phone] Phone # [phone] [address] [place], [place] [address]

## 2020-10-23 ENCOUNTER — Inpatient Hospital Stay (HOSPITAL_COMMUNITY): Payer: Medicare Other

## 2020-10-23 ENCOUNTER — Inpatient Hospital Stay (HOSPITAL_COMMUNITY)
Admission: EM | Discharge status: Against Medical Advice | Payer: Self-pay | Source: Home / Self Care | Attending: Cardiology

## 2020-10-23 DIAGNOSIS — R0609 Other forms of dyspnea: Secondary | ICD-10-CM | POA: Diagnosis not present

## 2020-10-23 DIAGNOSIS — Z720 Tobacco use: Secondary | ICD-10-CM

## 2020-10-23 DIAGNOSIS — I10 Essential (primary) hypertension: Secondary | ICD-10-CM

## 2020-10-23 DIAGNOSIS — F410 Panic disorder [episodic paroxysmal anxiety] without agoraphobia: Secondary | ICD-10-CM

## 2020-10-23 DIAGNOSIS — R0600 Dyspnea, unspecified: Secondary | ICD-10-CM

## 2020-10-23 DIAGNOSIS — I160 Hypertensive urgency: Secondary | ICD-10-CM

## 2020-10-23 HISTORY — PX: LEFT HEART CATH AND CORONARY ANGIOGRAPHY: CATH118249

## 2020-10-23 HISTORY — PX: CORONARY PRESSURE/FFR STUDY: CATH118243

## 2020-10-23 LAB — CBC
HCT: 38.3 % (ref 36.0–46.0)
Hemoglobin: 12.7 g/dL (ref 12.0–15.0)
MCH: 27.7 pg (ref 26.0–34.0)
MCHC: 33.2 g/dL (ref 30.0–36.0)
MCV: 83.4 fL (ref 80.0–100.0)
Platelets: 217 10*3/uL (ref 150–400)
RBC: 4.59 MIL/uL (ref 3.87–5.11)
RDW: 14.6 % (ref 11.5–15.5)
WBC: 6.9 10*3/uL (ref 4.0–10.5)
nRBC: 0 % (ref 0.0–0.2)

## 2020-10-23 LAB — POCT ACTIVATED CLOTTING TIME
Activated Clotting Time: 289 seconds
Activated Clotting Time: 231 seconds
Activated Clotting Time: 213 seconds
Activated Clotting Time: 190 seconds
Activated Clotting Time: 179 seconds

## 2020-10-23 LAB — ECHOCARDIOGRAM COMPLETE
AR max vel: 2.57 cm2
AV Area VTI: 2.33 cm2
AV Area mean vel: 2.3 cm2
AV Mean grad: 5 mmHg
AV Peak grad: 8.8 mmHg
Ao pk vel: 1.48 m/s
Area-P 1/2: 2.48 cm2
Height: 64 in
P 1/2 time: 859 msec
S' Lateral: 3.3 cm
Weight: 2500.8 oz

## 2020-10-23 LAB — PHOSPHORUS: Phosphorus: 3.9 mg/dL (ref 2.5–4.6)

## 2020-10-23 LAB — MAGNESIUM: Magnesium: 2.2 mg/dL (ref 1.7–2.4)

## 2020-10-23 SURGERY — LEFT HEART CATH AND CORONARY ANGIOGRAPHY
Anesthesia: LOCAL

## 2020-10-23 MED ORDER — OXYCODONE HCL 5 MG PO TABS: 5.0000 mg | ORAL_TABLET | ORAL | Status: DC | PRN

## 2020-10-23 MED ORDER — LABETALOL HCL 5 MG/ML IV SOLN: 10.0000 mg | INTRAVENOUS | Status: AC | PRN

## 2020-10-23 MED ORDER — SODIUM CHLORIDE 0.9 % IV SOLN: INTRAVENOUS | Status: AC

## 2020-10-23 MED ORDER — HYDRALAZINE HCL 20 MG/ML IJ SOLN: 10.0000 mg | INTRAMUSCULAR | Status: AC | PRN

## 2020-10-23 MED ORDER — LABETALOL HCL 5 MG/ML IV SOLN: 10.0000 mg | INTRAVENOUS | Status: DC | PRN

## 2020-10-23 MED ORDER — LIDOCAINE HCL (PF) 1 % IJ SOLN
INTRAMUSCULAR | Status: AC
  Filled 2020-10-23: qty 30

## 2020-10-23 MED ORDER — ENOXAPARIN SODIUM 40 MG/0.4ML IJ SOSY: 40.0000 mg | PREFILLED_SYRINGE | INTRAMUSCULAR | Status: DC

## 2020-10-23 MED ORDER — FENTANYL CITRATE (PF) 100 MCG/2ML IJ SOLN
INTRAMUSCULAR | Status: DC | PRN
  Administered 2020-10-23: 25 ug via INTRAVENOUS
  Administered 2020-10-23: 50 ug via INTRAVENOUS

## 2020-10-23 MED ORDER — ACETAMINOPHEN 325 MG PO TABS: 650.0000 mg | ORAL_TABLET | ORAL | Status: DC | PRN

## 2020-10-23 MED ORDER — SODIUM CHLORIDE 0.9 % IV SOLN: 250.0000 mL | INTRAVENOUS | Status: DC | PRN

## 2020-10-23 MED ORDER — SODIUM CHLORIDE 0.9% FLUSH: 3.0000 mL | INTRAVENOUS | Status: DC | PRN

## 2020-10-23 MED ORDER — ASPIRIN 81 MG PO CHEW: 81.0000 mg | CHEWABLE_TABLET | Freq: Every day | ORAL | Status: DC

## 2020-10-23 MED ORDER — ATORVASTATIN CALCIUM 80 MG PO TABS: 80.0000 mg | ORAL_TABLET | Freq: Every day | ORAL | Status: DC

## 2020-10-23 MED ORDER — HEPARIN SODIUM (PORCINE) 1000 UNIT/ML IJ SOLN
INTRAMUSCULAR | Status: AC
  Filled 2020-10-23: qty 1

## 2020-10-23 MED ORDER — HYDRALAZINE HCL 20 MG/ML IJ SOLN: 10.0000 mg | INTRAMUSCULAR | Status: DC | PRN

## 2020-10-23 MED ORDER — SODIUM CHLORIDE 0.9 % IV SOLN
INTRAVENOUS | Status: AC | PRN
  Administered 2020-10-23: 100 mL/h via INTRAVENOUS

## 2020-10-23 MED ORDER — FENTANYL CITRATE (PF) 100 MCG/2ML IJ SOLN
INTRAMUSCULAR | Status: AC
  Filled 2020-10-23: qty 2

## 2020-10-23 MED ORDER — CARVEDILOL 3.125 MG PO TABS: 3.1250 mg | ORAL_TABLET | Freq: Two times a day (BID) | ORAL | Status: DC

## 2020-10-23 MED ORDER — SODIUM CHLORIDE 0.9% FLUSH: 3.0000 mL | Freq: Two times a day (BID) | INTRAVENOUS | Status: DC

## 2020-10-23 MED ORDER — HEPARIN (PORCINE) IN NACL 1000-0.9 UT/500ML-% IV SOLN
INTRAVENOUS | Status: AC
  Filled 2020-10-23: qty 1000

## 2020-10-23 MED ORDER — NITROGLYCERIN 1 MG/10 ML FOR IR/CATH LAB
INTRA_ARTERIAL | Status: AC
  Filled 2020-10-23: qty 10

## 2020-10-23 MED ORDER — MIDAZOLAM HCL 2 MG/2ML IJ SOLN
INTRAMUSCULAR | Status: DC | PRN
  Administered 2020-10-23 (×3): 1 mg via INTRAVENOUS

## 2020-10-23 MED ORDER — MIDAZOLAM HCL 2 MG/2ML IJ SOLN
INTRAMUSCULAR | Status: AC
  Filled 2020-10-23: qty 2

## 2020-10-23 MED ORDER — VERAPAMIL HCL 2.5 MG/ML IV SOLN
INTRAVENOUS | Status: AC
  Filled 2020-10-23: qty 2

## 2020-10-23 MED ORDER — IOHEXOL 350 MG/ML SOLN
INTRAVENOUS | Status: DC | PRN
  Administered 2020-10-23: 100 mL

## 2020-10-23 MED ORDER — LIDOCAINE HCL (PF) 1 % IJ SOLN
INTRAMUSCULAR | Status: DC | PRN
  Administered 2020-10-23: 2 mL via INTRADERMAL
  Administered 2020-10-23: 15 mL via INTRADERMAL

## 2020-10-23 MED ORDER — HEPARIN (PORCINE) IN NACL 1000-0.9 UT/500ML-% IV SOLN
INTRAVENOUS | Status: DC | PRN
Start: 2020-10-23 — End: 2020-10-23
  Administered 2020-10-23 (×2): 500 mL

## 2020-10-23 SURGICAL SUPPLY — 15 items
CATH 5FR JL3.5 JR4 ANG PIG MP (CATHETERS) ×1 IMPLANT
CATH INFINITI 5FR MPB2 (CATHETERS) ×1 IMPLANT
CATH LAUNCHER 5F EBU3.5 (CATHETERS) ×1 IMPLANT
GLIDESHEATH SLEND A-KIT 6F 22G (SHEATH) ×1 IMPLANT
GUIDEWIRE INQWIRE 1.5J.035X260 (WIRE) IMPLANT
GUIDEWIRE PRESSURE X 175 (WIRE) ×1 IMPLANT
INQWIRE 1.5J .035X260CM (WIRE) ×2
KIT ESSENTIALS PG (KITS) ×2 IMPLANT
KIT HEART LEFT (KITS) ×2 IMPLANT
PACK CARDIAC CATHETERIZATION (CUSTOM PROCEDURE TRAY) ×2 IMPLANT
SHEATH PINNACLE 5F 10CM (SHEATH) ×1 IMPLANT
SHEATH PROBE COVER 6X72 (BAG) ×1 IMPLANT
TRANSDUCER W/STOPCOCK (MISCELLANEOUS) ×2 IMPLANT
TUBING CIL FLEX 10 FLL-RA (TUBING) ×2 IMPLANT
WIRE EMERALD 3MM-J .035X150CM (WIRE) ×1 IMPLANT

## 2020-10-23 NOTE — Progress Notes (Signed)
Site Area:  RFA x 1 Site prior to removal: Level 0 Pressure applied for:  20 minues Manual:  yes Pt status during pull:  stable Post pull site:  level 0 Post pull instructions given:  yes Post pull pulses present: DP Dressing applied:  gauze/tegaderm Bedrest begins @: 15:30 Comments: Removed by Vear Clock RN

## 2020-10-23 NOTE — Progress Notes (Signed)
This RN called to room by NT. Pt is requesting for everything to be removed from her so that she may get up and go to the bathroom and shower. Pt is post cardiac cath. This RN explained that she is unable to shower due to groin needs to remain dry for at least 24 hours. Pt is yelling and demanding to be able to shower. This RN explained we can assist her to wash up at sink. Pt advised she is going home. This RN attempted to explain why she should stay she refused to listen and said she was going home. Pt signed [place] paperwork. MD was notified.

## 2020-10-23 NOTE — Plan of Care (Signed)
  Problem: Education: Goal: Knowledge of General Education information will improve Description: Including pain rating scale, medication(s)/side effects and non-pharmacologic comfort measures Outcome: Completed/Met

## 2020-10-23 NOTE — Progress Notes (Signed)
Progress Note  Patient Name: Bethany Wang Date of Encounter: 10/23/2020  [facility] Cardiologist: [name], MD   Subjective   Pt was evaluated at the bedside laying comfortably in bed. States she did not sleep well but denies any SOB or CP at rest. She was able to move around but had DOE as before. Reports she stays in bed most of the time so she does not faint.  Inpatient Medications    Scheduled Meds:  amLODipine  10 mg Oral Daily   [START ON 10/24/2020] aspirin EC  81 mg Oral Daily   atorvastatin  40 mg Oral Daily   carvedilol  3.125 mg Oral BID WC   heparin injection (subcutaneous)  5,000 Units Subcutaneous Q8H   isosorbide mononitrate  90 mg Oral Daily   losartan  100 mg Oral Daily   sodium chloride flush  3 mL Intravenous Q12H   Continuous Infusions:  sodium chloride     sodium chloride     Followed by   sodium chloride     PRN Meds: sodium chloride, acetaminophen **OR** acetaminophen, labetalol, senna-docusate, sodium chloride flush   Vital Signs    Vitals:   10/22/20 1934 10/23/20 0039 10/23/20 0300 10/23/20 0755  BP: (!) 148/70 129/80 101/75 138/74  Pulse:    (!) 53  Resp:  12 20 18   Temp: 98.5 F (36.9 C) 98.5 F (36.9 C) 97.8 F (36.6 C) 98 F (36.7 C)  TempSrc: Oral Oral Oral Oral  SpO2:  97% 96% 99%  Weight: 70.9 kg     Height: 5\' 4"  (1.626 m)      No intake or output data in the 24 hours ending 10/23/20 0750 Last 3 Weights 10/22/2020 10/22/2020 09/22/2020  Weight (lbs) 156 lb 4.8 oz 146 lb 155 lb 9.6 oz  Weight (kg) 70.897 kg 66.225 kg 70.58 kg      Telemetry    Sinus brady with occasional PVCs - Personally Reviewed  ECG    No new EKGs  Physical Exam   GEN: No acute distress.   Neck: No JVD Cardiac: Bradycardia. Regular rhythm. No murmurs, rubs, or gallops.  Respiratory: Clear to auscultation bilaterally. GI: Soft, nontender, non-distended. Normal BS.  MS: No edema; No deformity. Neuro: A&Ox3. Moves all extremities. No  focal deficits.  Psych: Normal affect   Labs    High Sensitivity Troponin:   Recent Labs  Lab 10/22/20 1101 10/22/20 1315  TROPONINIHS 5 6      Chemistry Recent Labs  Lab 10/22/20 1101 10/22/20 1945  NA 139 139  K 3.8 3.7  CL 105 108  CO2 25 23  GLUCOSE 100* 110*  BUN 9 6  CREATININE 0.77 0.74  CALCIUM 9.6 9.3  PROT  --  6.8  ALBUMIN  --  3.6  AST  --  21  ALT  --  14  ALKPHOS  --  112  BILITOT  --  0.8  GFRNONAA >60 >60  ANIONGAP 9 8     Hematology Recent Labs  Lab 10/22/20 1101 10/23/20 0052  WBC 6.5 6.9  RBC 5.00 4.59  HGB 13.9 12.7  HCT 42.2 38.3  MCV 84.4 83.4  MCH 27.8 27.7  MCHC 32.9 33.2  RDW 14.6 14.6  PLT 238 217    BNP Recent Labs  Lab 10/22/20 1315  BNP 33.8     DDimer No results for input(s): DDIMER in the last 168 hours.   Radiology    DG Chest 2 View  Result Date: 10/22/2020 CLINICAL DATA:  Chest pain, shortness of breath EXAM: CHEST - 2 VIEW COMPARISON:  12/25/2019 FINDINGS: Heart and mediastinal contours are within normal limits. No focal opacities or effusions. No acute bony abnormality. IMPRESSION: No active cardiopulmonary disease. Electronically Signed   By: Rolm Baptise M.D.   On: 10/22/2020 11:14    Cardiac Studies   ECHO 7/14 pending:  LHC 10/23/20:  Total occlusion of RCA within the previously placed stent. Widely patent left main. First obtuse marginal 70% with distal circumflex 50 to 60%. LAD proximal stent diffuse ISR with 50 to 70% restenosis, eccentric.  RFR 0.92 Normal LVEDP less than 10 mmHg.  Patient Profile     55 y.o. female with PMH of CAD (s/p DES to RCA in 2013 b. DES to LAD in 2014 c. cath in 04/2016 showing patent LAD stent with D2 jailed by LAD stent and CTO of RCA with L to R collaterals, cath in 10/2018 with patent LAD stent and jailed D2), uncontrolled HTN, HLD, palpitations, TIA and breast cancer (s/p lumpectomy) and MI who is being seen 10/22/2020 for the evaluation of chest pain and shortness  of breath.  Assessment & Plan    Chest pain, unstable angina: No chest pain or SOB at rest. Still reports dyspnea and chest tightness on exertion. Did not require any prn nitro overnight. Tele still showing bradycardia. LHC today did not show any hemodynamically significant stenosis to explain chest discomfort. This will likely improve with better control of her BP, which will decrease afterload and myocardial demand. --Continue ASA 81 mg daily --PRN nitro for CP --Optimize BP regimen --Tele --Will likely need CABG in the future if medical management is not optimized   2v-CAD, HLD: Multivessel disease with placement of multiple stents. Last heart cath in 2020 showed patent LAD stent and jailed D2. LDL 146. Repeat LHC today shows total occlusion of RCA within the previously placed stent, widely patent left main, 70% stenosis of 1st obtuse marginal with 50-60% stenosis of distal circumflex. LAD proximal stent diffuse ISR with 50-70% restenosis, eccentric. RFR 0.92. Normal LVEDP less than 10 mmHg. Findings do not show any hemodynamically significant stenosis that needs intervention at the moment. Will need to be more compliant with medical management. --Continue home ASA 81 mg --Continue Lipitor 40 mg daily, can consider increasing to 80 mg --Continue coreg 3.125 mg BID for better BP control   Severe hypertension: Patient with a history of uncontrolled BP and is usually 170-180s/100-110s at home. BP much improved after resuming home meds. SBP in the 100s-120s overnight. Symptoms likely 2/2 her uncontrolled HTN.              --Decrease coreg to 3.125 mg BID --Continue home losartan, Amlodipine, Imdur --Educated on the importance of taking all her meds   HFpEF: Last echo in 2020 showed EF 60-65%, moderate LVH, mod AR.  Continues to have dyspnea on exertion. Still euvolemic on exam. ECHO completed, pending read. --Follow up repeat ECHO --Continue coreg 3.125 mg BID  --Strict I&Os --Daily  weights  Bradycardia: Patient presented with HR in the 50-60s. States this is chronic. Could be contributing to her presyncope episodes. Will monitor closely --Continue tele --Decrease coreg to 3.125 mg BID   GERD: Stable. Reports not taking home Protonix 40 mg daily Anxiety, depression: Stable.   For questions or updates, please contact [place] Please consult [email or web address] for contact info under        Signed, Lacinda Axon, MD  10/23/2020, 7:50 AM

## 2020-10-23 NOTE — Interval H&P Note (Signed)
Cath Lab Visit (complete for each Cath Lab visit)  Clinical Evaluation Leading to the Procedure:   ACS: Yes.    Non-ACS:    Anginal Classification: CCS III  Anti-ischemic medical therapy: Maximal Therapy (2 or more classes of medications)  Non-Invasive Test Results: No non-invasive testing performed  Prior CABG: No previous CABG      History and Physical Interval Note:  10/23/2020 10:09 AM  Bethany Wang  has presented today for surgery, with the diagnosis of unstable angina.  The various methods of treatment have been discussed with the patient and family. After consideration of risks, benefits and other options for treatment, the patient has consented to  Procedure(s): LEFT HEART CATH AND CORONARY ANGIOGRAPHY (N/A) as a surgical intervention.  The patient's history has been reviewed, patient examined, no change in status, stable for surgery.  I have reviewed the patient's chart and labs.  Questions were answered to the patient's satisfaction.     Belva Crome III

## 2020-10-23 NOTE — CV Procedure (Signed)
Total occlusion of RCA within the previously placed stent. Widely patent left main. First obtuse marginal 70% with distal circumflex 50 to 60%. LAD proximal stent diffuse ISR with 50 to 70% restenosis, eccentric.  RFR 0.92 Normal LVEDP less than 10 mmHg.

## 2020-10-24 ENCOUNTER — Encounter (HOSPITAL_COMMUNITY): Payer: Self-pay | Admitting: Interventional Cardiology

## 2020-10-24 MED FILL — Heparin Sodium (Porcine) Inj 1000 Unit/ML: INTRAMUSCULAR | Qty: 10 | Status: AC

## 2020-10-24 MED FILL — Nitroglycerin IV Soln 100 MCG/ML in D5W: INTRA_ARTERIAL | Qty: 10 | Status: AC

## 2020-10-24 MED FILL — Verapamil HCl IV Soln 2.5 MG/ML: INTRAVENOUS | Qty: 2 | Status: AC

## 2020-10-24 NOTE — Discharge Summary (Signed)
    Patient was admitted for symptoms concerning for possible angina and near syncope.  She underwent cardiac catheterization which showed FFR negative LAD lesion.  Patient has been somewhat argumentative and frustrated the entire time and hospitalization.  Plan was for her to be monitored overnight with medicine titration.  However at 10 PM on the evening of 10/23/2020, she became frustrated by not getting a shower.  The nurse explained that she was not to take a shower because of her groin access for 24 hours and the patient apparently began to yell at the minute and take a shower at which time the patient insisted that she was going to leave.  She then signed out AMA .  As such, no formal discharge summary is written as no medication reconciliation was performed.  She will need to contact the office in order to reschedule herself in clinic.   Glenetta Hew, M.D., M.S. Interventional Cardiologist   Pager # [phone] Phone # [phone] [address] [place], [place] [address]

## 2020-10-27 NOTE — Progress Notes (Signed)
Groin   Cardiology Office Note    Date:  10/28/2020   ID:  Bethany Wang, DOB 09/15/1965, MRN [id number]   PCP:  [name], MD   [place]  Cardiologist:  [name], MD   Advanced Practice Provider:  No care team member to display Electrophysiologist:  None   [phone]   No chief complaint on file.   History of Present Illness:  Bethany Wang is a 55 y.o. female with history of CAD status post DES to the RCA in 2013 DES to the LAD in 2014, cath 04/2016 patent LAD stent with jailed D2 by the LAD stent and CTO of RCA with left-to-right collaterals.  Also has hypertension, DM type II, HLD.    Patient was admitted to [facility] 10/22/2020 with unstable angina and severe HTN.  Cardiac cath showed total occlusion of the RCA within the previously placed stent, widely patent left main, 70% OM1, 50 to 60% distal circumflex, LAD proximal stent diffuse ISR with 50 to 70% restenosis eccentric RFR was 0.92.  Medical therapy recommended.  Patient signed out AMA.  Patient comes in for f/u. Patient says chest pain always there but a little better. BP better controlled.  Has not taken bandage off right groin and says it sore.    Past Medical History:  Diagnosis Date   Anemia    Anxiety    Bilateral breast cysts 12/30/2015   Brain tumor ([place])    Breast cancer ([place])    Left Breast Cancer   Breast disorder    BV (bacterial vaginosis) 09/09/2015   Coronary atherosclerosis of native coronary artery    a. s/p DES to RCA in 2013 b. DES to LAD in 2014 c. cath in 04/2016 showing patent LAD stent with D2 jailed by LAD stent and CTO of RCA with left to right collaterals present   Cyst of pharynx or nasopharynx    [name]   Decreased libido 12/25/2018   Depression    Diabetes mellitus without complication ([place])    Essential hypertension    Falls    x 3-4 in past year   GERD (gastroesophageal reflux disease)    Headache    History of  hematuria    Mixed hyperlipidemia    Personal history of chemotherapy    Left Breast Cancer   Personal history of radiation therapy    Left Breast Cancer   Pre-diabetes    PUD (peptic ulcer disease)    Seizures ([place])    Shingles 09/09/2015   Stroke ([place])    12-2017 on Plavix, only deficit is headaches   Suicide attempt ([place])    3 attempts in remote past   Trichimoniasis 08/20/2019   Treated 08/20/19    Uterine cancer ([place])    Vitamin D deficiency disease 04/03/2019    Past Surgical History:  Procedure Laterality Date   ABDOMINAL HYSTERECTOMY     BIOPSY  11/28/2019   Procedure: BIOPSY;  Surgeon: Rogene Houston, MD;  [facility];  Service: Endoscopy;;  antral, gastric body    BLADDER SURGERY     BREAST LUMPECTOMY Left    BREAST LUMPECTOMY Right 02/24/2018   Procedure: RIGHT BREAST LUMPECTOMY ERAS PATHWAY;  Surgeon: Jovita Kussmaul, MD;  Location[facility] OR;  Service: General;  Laterality: Right;   CARDIAC CATHETERIZATION N/A 05/10/2016   Procedure: Left Heart Cath and Coronary Angiography;  Surgeon: Belva Crome, MD;  Location: [place] CV LAB;  Service: Cardiovascular;  Laterality: N/A;   CAUTERIZE INNER NOSE  07/13/2020   COLONOSCOPY  May 2010   [place]man: normal rectum, internal hemorrhoids, , benign colonic polyp   COLONOSCOPY N/A 01/05/2017   Procedure: COLONOSCOPY;  Surgeon: Rogene Houston, MD;  [facility];  Service: Endoscopy;  Laterality: N/A;  1:00   ESOPHAGEAL DILATION  11/28/2019   Procedure: ESOPHAGEAL DILATION;  Surgeon: Rogene Houston, MD;  [facility];  Service: Endoscopy;;   ESOPHAGOGASTRODUODENOSCOPY     2001 Dr. Amedeo Plenty: distal esophagitis, small hiatal hernia,. Dr. Tamala Julian 2006? no records available currently, pt also reports  EGD a few years ago with Dr. Gala Romney, do not have these reports anywhere in medical records   ESOPHAGOGASTRODUODENOSCOPY  06/02/2011   XBJ:YNWGNF pill impaction as described above s/p dilation of a probable  cervical esophageal web/bx abnormal esophageal and gastric mucosa. + H.pylori gastritis    ESOPHAGOGASTRODUODENOSCOPY (EGD) WITH PROPOFOL N/A 11/28/2019   Procedure: ESOPHAGOGASTRODUODENOSCOPY (EGD) WITH PROPOFOL;  Surgeon: Rogene Houston, MD;  [facility];  Service: Endoscopy;  Laterality: N/A;  1020   EYE SURGERY     Removed glass   HAND SURGERY Right    INTRAVASCULAR PRESSURE WIRE/FFR STUDY N/A 10/23/2020   Procedure: INTRAVASCULAR PRESSURE WIRE/FFR STUDY;  Surgeon: Belva Crome, MD;  Location: [place] [facility] LAB;  Service: Cardiovascular;  Laterality: N/A;   Left breast lumpectomy     Benign   LEFT HEART CATH AND CORONARY ANGIOGRAPHY N/A 10/25/2018   Procedure: LEFT HEART CATH AND CORONARY ANGIOGRAPHY;  Surgeon: Wellington Hampshire, MD;  Location: [place] [facility] LAB;  Service: Cardiovascular;  Laterality: N/A;   LEFT HEART CATH AND CORONARY ANGIOGRAPHY N/A 10/23/2020   Procedure: LEFT HEART CATH AND CORONARY ANGIOGRAPHY;  Surgeon: Belva Crome, MD;  Location: [place] [facility] LAB;  Service: Cardiovascular;  Laterality: N/A;   LEFT HEART CATHETERIZATION WITH CORONARY ANGIOGRAM N/A 07/03/2014   Procedure: LEFT HEART CATHETERIZATION WITH CORONARY ANGIOGRAM;  Surgeon: Belva Crome, MD;  Location: [facility] CATH LAB;  Service: Cardiovascular;  Laterality: N/A;   POLYPECTOMY  01/05/2017   Procedure: POLYPECTOMY;  Surgeon: Rogene Houston, MD;  [facility];  Service: Endoscopy;;  colon   SHOULDER SURGERY Left     Current Medications: Current Meds  Medication Sig   ALPRAZolam (XANAX) 0.5 MG tablet Take 1 tablet (0.5 mg total) by mouth 2 (two) times daily as needed for anxiety.   amLODipine (NORVASC) 10 MG tablet Take 1 tablet (10 mg total) by mouth daily.   aspirin EC 81 MG tablet Take 1 tablet (81 mg total) by mouth daily.   atorvastatin (LIPITOR) 40 MG tablet Take 1 tablet (40 mg total) by mouth daily at 6 PM. (Patient taking differently: Take 40 mg by mouth daily.)    isosorbide mononitrate (IMDUR) 30 MG 24 hr tablet Take 90 mg by mouth daily.   LINZESS 145 MCG CAPS capsule Take 1 capsule (145 mcg total) by mouth daily.   losartan (COZAAR) 100 MG tablet Take 100 mg by mouth daily.   metoprolol tartrate (LOPRESSOR) 25 MG tablet Take 1 tablet (25 mg total) by mouth 2 (two) times daily.   nitroGLYCERIN (NITROSTAT) 0.4 MG SL tablet DISSOLVE 1 TABLET UNDER THE TONGUE AS NEEDED FOR CHEST PAIN EVERY 5 MINUTES UP TO 3 TIMES. IF NO RELIEF CALL 911. (Patient taking differently: Place 0.4 mg under the tongue See admin instructions. DISSOLVE 1 TABLET UNDER THE TONGUE AS NEEDED  FOR CHEST PAIN EVERY 5 MINUTES UP TO 3 TIMES. IF NO RELIEF CALL 911)   nystatin (MYCOSTATIN) 100000 UNIT/ML suspension Take 5 mLs (500,000 Units total) by mouth 4 (four) times daily.   Current Facility-Administered Medications for the 10/28/20 encounter (Office Visit) with Imogene Burn, PA-C  Medication   NONFORMULARY OR COMPOUNDED ITEM 0.2 mL     Allergies:   Bee venom, Penicillins, Shellfish allergy, Tomato, and Zofran [ondansetron]   Social History   Socioeconomic History   Marital status: Married    Spouse name: Saralyn Pilar   Number of children: 3   Years of education: Assoc   Highest education level: Not on file  Occupational History   Occupation: Retired    Comment: disability  Tobacco Use   Smoking status: Former    Packs/day: 0.20    Years: 32.00    Pack years: 6.40    Types: Cigarettes, Cigars    Start date: 07/05/1983    Quit date: 04/03/2012    Years since quitting: 8.5   Smokeless tobacco: Never  Vaping Use   Vaping Use: Never used  Substance and Sexual Activity   Alcohol use: No    Alcohol/week: 0.0 standard drinks   Drug use: No   Sexual activity: Yes    Birth control/protection: Surgical    Comment: hyst  Other Topics Concern   Not on file  Social History Narrative   Patient lives at new home with husband and extended family..On disability since age 79 yrs.    Social Determinants of Health   Financial Resource Strain: Low Risk    Difficulty of Paying Living Expenses: Not hard at all  Food Insecurity: No Food Insecurity   Worried About Charity fundraiser in the Last Year: Never true   [place] in the Last Year: Never true  Transportation Needs: No Transportation Needs   Lack of Transportation (Medical): No   Lack of Transportation (Non-Medical): No  Physical Activity: Inactive   Days of Exercise per Week: 0 days   Minutes of Exercise per Session: 0 min  Stress: No Stress Concern Present   Feeling of Stress : Not at all  Social Connections: Socially Integrated   Frequency of Communication with Friends and Family: More than three times a week   Frequency of Social Gatherings with Friends and Family: More than three times a week   Attends Religious Services: More than 4 times per year   Active Member of [employer] or Organizations: Yes   Attends Music therapist: More than 4 times per year   Marital Status: Married     Family History:  The patient's  family history includes ADD / ADHD in her daughter, son, and son; Alzheimer's disease in her paternal aunt; Aneurysm in her mother; Bipolar disorder in her daughter, son, and son; Cancer in her father, maternal uncle, and paternal aunt; Cirrhosis in her maternal uncle; Colon cancer in her paternal grandfather; Heart attack in her brother; Heart disease in her brother; Mental illness in her daughter, son, and son.   ROS:   Please see the history of present illness.    ROS All other systems reviewed and are negative.   PHYSICAL EXAM:   VS:  BP 124/78   Pulse 74   Ht 5\' 4"  (1.626 m)   Wt 156 lb (70.8 kg)   BMI 26.78 kg/m   Physical Exam  GEN: Well nourished, well developed, in no acute distress  Neck: no JVD, carotid  bruits, or masses Cardiac:RRR; no murmurs, rubs, or gallops  Respiratory:  clear to auscultation bilaterally, normal work of breathing GI: soft, nontender,  nondistended, + BS Ext: without cyanosis, clubbing, or edema, Good distal pulses bilaterally Neuro:  Alert and Oriented x 3 Psych: euthymic mood, full affect  Wt Readings from Last 3 Encounters:  10/28/20 156 lb (70.8 kg)  10/22/20 156 lb 4.8 oz (70.9 kg)  09/22/20 155 lb 9.6 oz (70.6 kg)      Studies/Labs Reviewed:   EKG:  EKG is not ordered today.   Recent Labs: 07/03/2020: TSH 1.84 10/22/2020: ALT 14; B Natriuretic Peptide 33.8; BUN 6; Creatinine, Ser 0.74; Potassium 3.7; Sodium 139 10/23/2020: Hemoglobin 12.7; Magnesium 2.2; Platelets 217   Lipid Panel    Component Value Date/Time   CHOL 230 (H) 07/03/2020 1334   TRIG 175 (H) 07/03/2020 1334   HDL 53 07/03/2020 1334   CHOLHDL 4.3 07/03/2020 1334   VLDL 14 10/26/2018 0415   LDLCALC 146 (H) 07/03/2020 1334    Additional studies/ records that were reviewed today include:  Cardiac cath 10/23/2020 Total occlusion of RCA within the previously placed stent. Widely patent left main. First obtuse marginal 70% with distal circumflex 50 to 60%. LAD proximal stent diffuse ISR with 50 to 70% restenosis, eccentric.  RFR 0.92 Normal LVEDP less than 10 mmHg.  2D echo 10/23/2020 IMPRESSIONS     1. Left ventricular ejection fraction, by estimation, is 55 to 60%. The  left ventricle has normal function. The left ventricle has no regional  wall motion abnormalities. There is mild left ventricular hypertrophy.  Left ventricular diastolic parameters  are consistent with Grade I diastolic dysfunction (impaired relaxation).   2. Right ventricular systolic function is normal. The right ventricular  size is normal. Tricuspid regurgitation signal is inadequate for assessing  PA pressure.   3. The mitral valve is normal in structure. Trivial mitral valve  regurgitation. No evidence of mitral stenosis.   4. The aortic valve is tricuspid. Aortic valve regurgitation is moderate  to severe. Early diastolic flow reversal in the descending  thoracic aorta  PW doppler pattern. No aortic stenosis is present.   5. The inferior vena cava is normal in size with greater than 50%  respiratory variability, suggesting right atrial pressure of 3 mmHg.   ZIO 08/2020 Study Highlights  ZIO XT reviewed.  6 days 7 hours analyzed.  Predominant rhythm is sinus with heart rate ranging from 45 bpm up to 148 bpm and average heart rate 73 bpm.  Rare PACs were noted including couplets and triplets representing less than 1% total beats.  Rare PVCs were noted representing less than 1% total beats.  Limited ventricular bigeminy noted.  There were two brief episodes of NSVT lasting no more than 5 beats.  Also three episodes of PSVT lasting no more than 5 beats.  There were no sustained arrhythmias or pauses.               Risk Assessment/Calculations:         ASSESSMENT:    1. Coronary artery disease involving native coronary artery of native heart with angina pectoris ([place])   2. Essential hypertension   3. Mixed hyperlipidemia   4. Palpitations      PLAN:  In order of problems listed above:  CAD with prior DES RCA 2013, DES LAD 2014 most recent cath 10/23/20 was found to have total occlusion of RCA within the previously placed stent, LAD proximal stent diffuse ISR  with 50 to 70% restenosis eccentric RFR 0.92 medical management recommended.  Patient signed out AMA.  Says her chest pain is always there but has gotten some better.  Blood pressure much better controlled.Consider Imdur in the future  Essential hypertension blood pressure much better controlled.  Continue current medications.  Hyperlipidemia LDL 146 07/03/2020 started on Lipitor 40 mg once daily.  We will check fasting lipid panel today.  Adjust as needed.  Palpitations with PSVT and NSVT on Holter monitor 08/2020 on metoprolol 25 mg twice daily.  I told her she could take an extra 1 as needed for fast heart rates  Lower extremity edema no edema on exam today  Shared  Decision Making/Informed Consent        Medication Adjustments/Labs and Tests Ordered: Current medicines are reviewed at length with the patient today.  Concerns regarding medicines are outlined above.  Medication changes, Labs and Tests ordered today are listed in the Patient Instructions below. Patient Instructions  Medication Instructions:  Your physician recommends that you continue on your current medications as directed. Please refer to the Current Medication list given to you today.  *If you need a refill on your cardiac medications before your next appointment, please call your pharmacy*   Lab Work: Your physician recommends that you return for lab work today.   If you have labs (blood work) drawn today and your tests are completely normal, you will receive your results only by: [place] (if you have MyChart) OR A paper copy in the mail If you have any lab test that is abnormal or we need to change your treatment, we will call you to review the results.   Testing/Procedures: NONE    Follow-Up: At [facility], you and your health needs are our priority.  As part of our continuing mission to provide you with exceptional heart care, we have created designated Provider Care Teams.  These Care Teams include your primary Cardiologist (physician) and Advanced Practice Providers (APPs -  Physician Assistants and Nurse Practitioners) who all work together to provide you with the care you need, when you need it.  We recommend signing up for the patient portal called "MyChart".  Sign up information is provided on this After Visit Summary.  MyChart is used to connect with patients for Virtual Visits (Telemedicine).  Patients are able to view lab/test results, encounter notes, upcoming appointments, etc.  Non-urgent messages can be sent to your provider as well.   To learn more about what you can do with MyChart, go to [email or web address].    Your next appointment:   2 -3  month(s)  The format for your next appointment:   In Person  Provider:   [name], MD, [name], PA-C, or Ermalinda Barrios, PA-C   Other Instructions Thank you for choosing [facility]!     Signed, Ermalinda Barrios, PA-C  10/28/2020 1:37 PM    [facility] [address], [place], [place]  [address] Phone: [phone]; Fax: [phone]

## 2020-10-28 ENCOUNTER — Other Ambulatory Visit: Payer: Self-pay

## 2020-10-28 ENCOUNTER — Encounter: Payer: Self-pay | Admitting: Physician Assistant

## 2020-10-28 ENCOUNTER — Other Ambulatory Visit (HOSPITAL_COMMUNITY)
Admission: RE | Admit: 2020-10-28 | Discharge: 2020-10-28 | Disposition: A | Discharge status: Home / Self Care | Payer: Medicare Other | Source: Ambulatory Visit | Attending: Physician Assistant | Admitting: Physician Assistant

## 2020-10-28 ENCOUNTER — Ambulatory Visit (INDEPENDENT_AMBULATORY_CARE_PROVIDER_SITE_OTHER): Payer: Medicare Other | Admitting: Physician Assistant

## 2020-10-28 VITALS — BP 124/78 | HR 74 | Ht 64.0 in | Wt 156.0 lb

## 2020-10-28 DIAGNOSIS — I25119 Atherosclerotic heart disease of native coronary artery with unspecified angina pectoris: Secondary | ICD-10-CM | POA: Diagnosis not present

## 2020-10-28 DIAGNOSIS — R002 Palpitations: Secondary | ICD-10-CM | POA: Diagnosis not present

## 2020-10-28 DIAGNOSIS — I10 Essential (primary) hypertension: Secondary | ICD-10-CM

## 2020-10-28 DIAGNOSIS — E782 Mixed hyperlipidemia: Secondary | ICD-10-CM | POA: Diagnosis not present

## 2020-10-28 LAB — LIPID PANEL
Cholesterol: 186 mg/dL (ref 0–200)
HDL: 54 mg/dL (ref >40)
LDL Cholesterol: 99 mg/dL (ref 0–99)
Total CHOL/HDL Ratio: 3.4 RATIO
Triglycerides: 163 mg/dL — ABNORMAL HIGH (ref <150)
VLDL: 33 mg/dL (ref 0–40)

## 2020-10-28 NOTE — Patient Instructions (Signed)
Medication Instructions:  Your physician recommends that you continue on your current medications as directed. Please refer to the Current Medication list given to you today.  *If you need a refill on your cardiac medications before your next appointment, please call your pharmacy*   Lab Work: Your physician recommends that you return for lab work today.   If you have labs (blood work) drawn today and your tests are completely normal, you will receive your results only by: [place] (if you have MyChart) OR A paper copy in the mail If you have any lab test that is abnormal or we need to change your treatment, we will call you to review the results.   Testing/Procedures: NONE    Follow-Up: At [facility], you and your health needs are our priority.  As part of our continuing mission to provide you with exceptional heart care, we have created designated Provider Care Teams.  These Care Teams include your primary Cardiologist (physician) and Advanced Practice Providers (APPs -  Physician Assistants and Nurse Practitioners) who all work together to provide you with the care you need, when you need it.  We recommend signing up for the patient portal called "MyChart".  Sign up information is provided on this After Visit Summary.  MyChart is used to connect with patients for Virtual Visits (Telemedicine).  Patients are able to view lab/test results, encounter notes, upcoming appointments, etc.  Non-urgent messages can be sent to your provider as well.   To learn more about what you can do with MyChart, go to [email or web address].    Your next appointment:   2 -3 month(s)  The format for your next appointment:   In Person  Provider:   [name], MD, [name], PA-C, or Ermalinda Barrios, PA-C   Other Instructions Thank you for choosing [facility]!

## 2020-10-30 ENCOUNTER — Telehealth: Payer: Self-pay | Admitting: *Deleted

## 2020-10-30 DIAGNOSIS — E782 Mixed hyperlipidemia: Secondary | ICD-10-CM

## 2020-10-30 MED ORDER — ATORVASTATIN CALCIUM 80 MG PO TABS: 80.0000 mg | ORAL_TABLET | Freq: Every day | ORAL | 3 refills | Status: DC

## 2020-10-30 NOTE — Telephone Encounter (Signed)
Pt notified order placed 

## 2020-10-30 NOTE — Telephone Encounter (Signed)
-----   Message from Imogene Burn, PA-C sent at 10/28/2020  3:38 PM EDT ----- Triglycerides 163 should reduce sugars in diet.  LDL has come down to 99 would like to have less than 70.  Recommend increasing Lipitor to 80 mg once daily and recheck fasting lipid panel in 3 months.

## 2020-10-31 ENCOUNTER — Telehealth: Payer: Self-pay | Admitting: Physician Assistant

## 2020-10-31 DIAGNOSIS — E782 Mixed hyperlipidemia: Secondary | ICD-10-CM

## 2020-10-31 NOTE — Telephone Encounter (Signed)
New message    Pt c/o medication issue:  1. Name of Medication: Lipitor   2. How are you currently taking this medication (dosage and times per day)? 1 x a day   3. Are you having a reaction (difficulty breathing--STAT)? yes  4. What is your medication issue? Severe stomach pain, itching

## 2020-10-31 NOTE — Telephone Encounter (Signed)
Returned call to pt. No answer. Left msg to call back.  

## 2020-11-03 ENCOUNTER — Other Ambulatory Visit (INDEPENDENT_AMBULATORY_CARE_PROVIDER_SITE_OTHER): Payer: Self-pay | Admitting: Internal Medicine

## 2020-11-03 ENCOUNTER — Telehealth (INDEPENDENT_AMBULATORY_CARE_PROVIDER_SITE_OTHER): Payer: Self-pay

## 2020-11-03 DIAGNOSIS — R040 Epistaxis: Secondary | ICD-10-CM

## 2020-11-03 DIAGNOSIS — Z0001 Encounter for general adult medical examination with abnormal findings: Secondary | ICD-10-CM

## 2020-11-03 DIAGNOSIS — E119 Type 2 diabetes mellitus without complications: Secondary | ICD-10-CM

## 2020-11-03 DIAGNOSIS — R55 Syncope and collapse: Secondary | ICD-10-CM

## 2020-11-03 DIAGNOSIS — I10 Essential (primary) hypertension: Secondary | ICD-10-CM

## 2020-11-03 NOTE — Telephone Encounter (Signed)
Referral placed. Called to notify pt. No answer left msg to call back.

## 2020-11-03 NOTE — Telephone Encounter (Signed)
Spoke with pt who states that she stopped taking the Lipitor after reviewing side effects with the pharmacy. Pt states that Lipitor is causing her to have abd pains and hives. Please advise.

## 2020-11-03 NOTE — Telephone Encounter (Signed)
Transition Care Management Follow-up Telephone Call Date of discharge and from where: 10/23/20; [facility] Cardiac unit. How have you been since you were released from the hospital? Better, but upset with hospital staffing. Nurse, NT on evening night shift  Any questions or concerns? Yes  Items Reviewed: Did the pt receive and understand the discharge instructions provided? Yes  Medications obtained and verified? Yes  Other? Yes  Any new allergies since your discharge? Yes , Lipitor  Dietary orders reviewed? Yes, 1st week eat lite, yogurt , fluids. To gt heparin out of system. Do you have support at home? No   Home Care and Equipment/Supplies: Were home health services ordered? yes If so, what is the name of the agency? [facility] and contact Mrs Wilfred Curtis ,case worker [phone]. Has the agency set up a time to come to the patient's home? no Were any new equipment or medical supplies ordered?  Yes: Pt will need a RX order for a Humidifier, BGC new kit setup and prep What is the name of the medical supply agency? [facility] Were you able to get the supplies/equipment? Yes, will need new orders to be sent over. Do you have any questions related to the use of the equipment or supplies? No  Functional Questionnaire: (I = Independent and D = Dependent) ADLs:  I    Bathing/Dressing- I  Meal Prep- I  Eating- I  Maintaining continence- I  Transferring/Ambulation- I  Managing Meds- I  Follow up appointments reviewed:  PCP Hospital f/u appt confirmed? Yes  Scheduled to see [facility] GOSRANI,MD on 01/05/21 @ 8:45AM. [facility] f/u appt confirmed? Yes  Scheduled to see [facility] on TBA. Are transportation arrangements needed? Yes  If their condition worsens, is the pt aware to call PCP or go to the Emergency Dept.? Yes Was the patient provided with contact information for the PCP's office or ED? Yes Was to pt encouraged to call back with questions or  concerns? Yes, SHE SAID IF I NEED ANYTHING I WILL COME TO DR [name] OFFICE OR ER.

## 2020-11-03 NOTE — Telephone Encounter (Signed)
I am covering for Gerrianne Scale, PA-C while she is on vacation. Refer to [facility] for alternative management of hyperlipidemia. Order under Gerrianne Scale, PA-C please. Richardson Dopp, PA-C    11/03/2020 2:37 PM

## 2020-11-04 NOTE — Telephone Encounter (Signed)
Left a message for patient to call office back .  

## 2020-11-12 ENCOUNTER — Telehealth (INDEPENDENT_AMBULATORY_CARE_PROVIDER_SITE_OTHER): Payer: Self-pay

## 2020-11-12 DIAGNOSIS — E119 Type 2 diabetes mellitus without complications: Secondary | ICD-10-CM

## 2020-11-12 MED ORDER — BLOOD GLUCOSE MONITOR KIT
PACK | 0 refills | Status: DC
Start: 2020-11-12 — End: 2022-11-03

## 2020-11-12 NOTE — Telephone Encounter (Signed)
Bethany Wang called and stated that she did not receive the fax for the humidifier and her new fax number is: [phone]  Bethany Wang, patient is requesting a new blood glucose monitor to check her blood sugars also per Bethany Wang. Bethany Wang's number is [phone].

## 2020-11-12 NOTE — Telephone Encounter (Signed)
Do we need to resend the fax? I am not sure where it is located.   I have sent an electronic order for [facility]cometer [facility].

## 2020-11-18 NOTE — Telephone Encounter (Signed)
These were faxed last week and confirmation has been received.

## 2020-12-08 ENCOUNTER — Other Ambulatory Visit: Payer: Self-pay | Admitting: Cardiology

## 2020-12-09 MED ORDER — METOPROLOL TARTRATE 25 MG PO TABS: 25.0000 mg | ORAL_TABLET | Freq: Two times a day (BID) | ORAL | 3 refills | Status: DC

## 2020-12-09 MED ORDER — ATORVASTATIN CALCIUM 80 MG PO TABS: 80.0000 mg | ORAL_TABLET | Freq: Every day | ORAL | 3 refills | Status: DC

## 2020-12-14 ENCOUNTER — Other Ambulatory Visit: Payer: Self-pay | Admitting: Cardiology

## 2020-12-16 ENCOUNTER — Other Ambulatory Visit: Payer: Self-pay | Admitting: Cardiology

## 2020-12-16 NOTE — Progress Notes (Signed)
Cardiology Office Note    Date:  12/29/2020   ID:  Bethany DEGRAFFENREID, DOB [birth year], MRN [id number]   PCP:  Bethany Albee, MD (Inactive)   [place]  Cardiologist:  Bethany Lesches, MD   Advanced Practice Provider:  No care team member to display Electrophysiologist:  None   [phone]   Chief Complaint  Patient presents with   Follow-up     History of Present Illness:  Bethany Wang is a 55 y.o. female with history of CAD status post DES to the RCA in 2013 DES to the LAD in 2014, cath 04/2016 patent LAD stent with jailed D2 by the LAD stent and CTO of RCA with left-to-right collaterals.  Also has hypertension, DM type II, HLD.     Patient was admitted to [facility] 10/22/2020 with unstable angina and severe HTN.  Cardiac cath showed total occlusion of the RCA within the previously placed stent, widely patent left main, 70% OM1, 50 to 60% distal circumflex, LAD proximal stent diffuse ISR with 50 to 70% restenosis eccentric RFR was 0.92.  Medical therapy recommended.  Patient signed out AMA.   I saw the patient in follow-up 10/28/2020 and she complained of constant chest pain that was always there but was better.  Patient had been started on Lipitor but developed hives was referred to lipid clinic.  Patient upset about not seeing Dr. Domenic Wang in several years although he saw her 06/2019. Has chronic chest pain sometimes uses NTG and ASA. Has easing bruising and leg cramps, rubs her legs and then they bruise. Says she walks 5 miles in am 4 1/2 miles pm daily does gardening and. No exertional chest pain. Chest pain when at rest and stressed. A lot of deaths in family. A1C coming down.     Past Medical History:  Diagnosis Date   Anemia    Anxiety    Bilateral breast cysts 12/30/2015   Brain tumor ([place])    Breast cancer ([place])    Left Breast Cancer   Breast disorder    BV (bacterial vaginosis) 09/09/2015   Coronary atherosclerosis of native coronary  artery    a. s/p DES to RCA in 2013 b. DES to LAD in 2014 c. cath in 04/2016 showing patent LAD stent with D2 jailed by LAD stent and CTO of RCA with left to right collaterals present   Cyst of pharynx or nasopharynx    [name]   Decreased libido 12/25/2018   Depression    Diabetes mellitus without complication (Bethany Wang)    Essential hypertension    Falls    x 3-4 in past year   GERD (gastroesophageal reflux disease)    Headache    History of hematuria    Mixed hyperlipidemia    Personal history of chemotherapy    Left Breast Cancer   Personal history of radiation therapy    Left Breast Cancer   Pre-diabetes    PUD (peptic ulcer disease)    Seizures ([place])    Shingles 09/09/2015   Stroke (Bethany Wang)    12-2017 on Plavix, only deficit is headaches   Suicide attempt (Bethany Wang)    3 attempts in remote past   Trichimoniasis 08/20/2019   Treated 08/20/19    Uterine cancer (Bethany Wang)    Vitamin D deficiency disease 04/03/2019    Past Surgical History:  Procedure Laterality Date   ABDOMINAL HYSTERECTOMY     BIOPSY  11/28/2019   Procedure: BIOPSY;  Surgeon:  Bethany Hippo, MD;  [facility];  Service: Endoscopy;;  antral, gastric body    BLADDER SURGERY     BREAST LUMPECTOMY Left    BREAST LUMPECTOMY Right 02/24/2018   Procedure: RIGHT BREAST LUMPECTOMY ERAS PATHWAY;  Surgeon: Bethany Miner, MD;  Location: [facility] OR;  Service: General;  Laterality: Right;   CARDIAC CATHETERIZATION N/A 05/10/2016   Procedure: Left Heart Cath and Coronary Angiography;  Surgeon: Bethany Records, MD;  Location: [facility] INVASIVE CV LAB;  Service: Cardiovascular;  Laterality: N/A;   CAUTERIZE INNER NOSE  07/13/2020   COLONOSCOPY  May 2010   Fleishman: normal rectum, internal hemorrhoids, , benign colonic polyp   COLONOSCOPY N/A 01/05/2017   Procedure: COLONOSCOPY;  Surgeon: Bethany Hippo, MD;  [facility]: [facility];  Service: Endoscopy;  Laterality: N/A;  1:00   ESOPHAGEAL DILATION  11/28/2019    Procedure: ESOPHAGEAL DILATION;  Surgeon: Bethany Hippo, MD;  [facility]: [facility];  Service: Endoscopy;;   ESOPHAGOGASTRODUODENOSCOPY     2001 Dr. Madilyn Wang: distal esophagitis, small hiatal hernia,. Dr. Katrinka Blazing 2006? no Wang available currently, pt also reports  EGD a few years ago with Dr. Jena Gauss, do not have these reports anywhere in medical Wang   ESOPHAGOGASTRODUODENOSCOPY  06/02/2011   KNV:[id number] pill impaction as described above s/p dilation of a probable cervical esophageal web/bx abnormal esophageal and gastric mucosa. + H.pylori gastritis    ESOPHAGOGASTRODUODENOSCOPY (EGD) WITH PROPOFOL N/A 11/28/2019   Procedure: ESOPHAGOGASTRODUODENOSCOPY (EGD) WITH PROPOFOL;  Surgeon: Bethany Hippo, MD;  [facility];  Service: Endoscopy;  Laterality: N/A;  1020   EYE SURGERY     Removed glass   HAND SURGERY Right    INTRAVASCULAR PRESSURE WIRE/FFR STUDY N/A 10/23/2020   Procedure: INTRAVASCULAR PRESSURE WIRE/FFR STUDY;  Surgeon: Bethany Records, MD;  Location: [facility] INVASIVE CV LAB;  Service: Cardiovascular;  Laterality: N/A;   Left breast lumpectomy     Benign   LEFT HEART CATH AND CORONARY ANGIOGRAPHY N/A 10/25/2018   Procedure: LEFT HEART CATH AND CORONARY ANGIOGRAPHY;  Surgeon: Bethany Ouch, MD;  Location: [facility] INVASIVE CV LAB;  Service: Cardiovascular;  Laterality: N/A;   LEFT HEART CATH AND CORONARY ANGIOGRAPHY N/A 10/23/2020   Procedure: LEFT HEART CATH AND CORONARY ANGIOGRAPHY;  Surgeon: Bethany Records, MD;  Location: [facility] INVASIVE CV LAB;  Service: Cardiovascular;  Laterality: N/A;   LEFT HEART CATHETERIZATION WITH CORONARY ANGIOGRAM N/A 07/03/2014   Procedure: LEFT HEART CATHETERIZATION WITH CORONARY ANGIOGRAM;  Surgeon: Bethany Records, MD;  Location: [facility] CATH LAB;  Service: Cardiovascular;  Laterality: N/A;   POLYPECTOMY  01/05/2017   Procedure: POLYPECTOMY;  Surgeon: Bethany Hippo, MD;  [facility];  Service: Endoscopy;;  colon   SHOULDER SURGERY Left      Current Medications: Current Meds  Medication Sig   ALPRAZolam (XANAX) 0.5 MG tablet Take 1 tablet (0.5 mg total) by mouth 2 (two) times daily as needed for anxiety.   amLODipine (NORVASC) 10 MG tablet Take 1 tablet (10 mg total) by mouth daily.   aspirin EC 81 MG tablet Take 1 tablet (81 mg total) by mouth daily.   blood glucose meter kit and supplies KIT Use to check blood sugar daily.   escitalopram (LEXAPRO) 20 MG tablet Take 1 tablet (20 mg total) by mouth daily.   fluorometholone (FML) 0.1 % ophthalmic suspension SMARTSIG:In Eye(s)   fluticasone-salmeterol (ADVAIR) 100-50 MCG/ACT AEPB INHALE 1 PUFF BY MOUTH TWICE DAILY  isosorbide mononitrate (IMDUR) 30 MG 24 hr tablet Take 90 mg by mouth daily.   LINZESS 145 MCG CAPS capsule Take 1 capsule (145 mcg total) by mouth daily.   losartan (COZAAR) 100 MG tablet TAKE 1 TABLET BY MOUTH ONCE DAILY   metoprolol tartrate (LOPRESSOR) 25 MG tablet Take 1 tablet (25 mg total) by mouth 2 (two) times daily.   nitroGLYCERIN (NITROSTAT) 0.4 MG SL tablet DISSOLVE 1 TABLET UNDER THE TONGUE AS NEEDED FOR CHEST PAIN EVERY 5 MINUTES UP TO 3 TIMES. IF NO RELIEF CALL 911.   nystatin (MYCOSTATIN) 100000 UNIT/ML suspension Take 5 mLs (500,000 Units total) by mouth 4 (four) times daily.   Current Facility-Administered Medications for the 12/29/20 encounter (Office Visit) with Imogene Burn, PA-C  Medication   NONFORMULARY OR COMPOUNDED ITEM 0.2 mL     Allergies:   Bee venom, Lipitor [atorvastatin], Penicillins, Shellfish allergy, Tomato, and Zofran [ondansetron]   Social History   Socioeconomic History   Marital status: Married    Spouse name: Saralyn Pilar   Number of children: 3   Years of education: Assoc   Highest education level: Not on file  Occupational History   Occupation: Retired    Comment: disability  Tobacco Use   Smoking status: Former    Packs/day: 0.20    Years: 32.00    Pack years: 6.40    Types: Cigarettes, Cigars    Start  date: 07/05/1983    Quit date: 04/03/2012    Years since quitting: 8.7   Smokeless tobacco: Never  Vaping Use   Vaping Use: Never used  Substance and Sexual Activity   Alcohol use: No    Alcohol/week: 0.0 standard drinks   Drug use: No   Sexual activity: Yes    Birth control/protection: Surgical    Comment: hyst  Other Topics Concern   Not on file  Social History Narrative   Patient lives at new home with husband and extended family..On disability since age 32 yrs.   Social Determinants of Health   Financial Resource Strain: Low Risk    Difficulty of Paying Living Expenses: Not hard at all  Food Insecurity: No Food Insecurity   Worried About Charity fundraiser in the Last Year: Never true   [place] in the Last Year: Never true  Transportation Needs: No Transportation Needs   Lack of Transportation (Medical): No   Lack of Transportation (Non-Medical): No  Physical Activity: Inactive   Days of Exercise per Week: 0 days   Minutes of Exercise per Session: 0 min  Stress: No Stress Concern Present   Feeling of Stress : Not at all  Social Connections: Socially Integrated   Frequency of Communication with Friends and Family: More than three times a week   Frequency of Social Gatherings with Friends and Family: More than three times a week   Attends Religious Services: More than 4 times per year   Active Member of [employer] or Organizations: Yes   Attends Music therapist: More than 4 times per year   Marital Status: Married     Family History:  The patient's  family history includes ADD / ADHD in her daughter, son, and son; Alzheimer's disease in her paternal aunt; Aneurysm in her mother; Bipolar disorder in her daughter, son, and son; Cancer in her father, maternal uncle, and paternal aunt; Cirrhosis in her maternal uncle; Colon cancer in her paternal grandfather; Heart attack in her brother; Heart disease in her brother; Mental  illness in her daughter, son, and  son.   ROS:   Please see the history of present illness.    ROS All other systems reviewed and are negative.   PHYSICAL EXAM:   VS:  BP 118/78   Pulse 80   Ht $R'5\' 4"'xs$  (1.626 m)   Wt 156 lb 6.4 oz (70.9 kg)   SpO2 98%   BMI 26.85 kg/m   Physical Exam  GEN: Well nourished, well developed, in no acute distress  Neck: no JVD, carotid bruits, or masses Cardiac:RRR; no murmurs, rubs, or gallops  Respiratory:  clear to auscultation bilaterally, normal work of breathing GI: soft, nontender, nondistended, + BS Ext: without cyanosis, clubbing, or edema, Good distal pulses bilaterally Neuro:  Alert and Oriented x 3 Psych: euthymic mood, full affect  Wt Readings from Last 3 Encounters:  12/29/20 156 lb 6.4 oz (70.9 kg)  10/28/20 156 lb (70.8 kg)  10/22/20 156 lb 4.8 oz (70.9 kg)      Studies/Labs Reviewed:   EKG:  EKG is not ordered today.     Recent Labs: 07/03/2020: TSH 1.84 10/22/2020: ALT 14; B Natriuretic Peptide 33.8; BUN 6; Creatinine, Ser 0.74; Potassium 3.7; Sodium 139 10/23/2020: Hemoglobin 12.7; Magnesium 2.2; Platelets 217   Lipid Panel    Component Value Date/Time   CHOL 186 10/28/2020 1349   TRIG 163 (H) 10/28/2020 1349   HDL 54 10/28/2020 1349   CHOLHDL 3.4 10/28/2020 1349   VLDL 33 10/28/2020 1349   LDLCALC 99 10/28/2020 1349   LDLCALC 146 (H) 07/03/2020 1334    Additional studies/ Wang that were reviewed today include:  Cardiac cath 10/23/2020 Total occlusion of RCA within the previously placed stent. Widely patent left main. First obtuse marginal 70% with distal circumflex 50 to 60%. LAD proximal stent diffuse ISR with 50 to 70% restenosis, eccentric.  RFR 0.92 Normal LVEDP less than 10 mmHg.   2D echo 10/23/2020 IMPRESSIONS     1. Left ventricular ejection fraction, by estimation, is 55 to 60%. The  left ventricle has normal function. The left ventricle has no regional  wall motion abnormalities. There is mild left ventricular hypertrophy.  Left  ventricular diastolic parameters  are consistent with Grade I diastolic dysfunction (impaired relaxation).   2. Right ventricular systolic function is normal. The right ventricular  size is normal. Tricuspid regurgitation signal is inadequate for assessing  PA pressure.   3. The mitral valve is normal in structure. Trivial mitral valve  regurgitation. No evidence of mitral stenosis.   4. The aortic valve is tricuspid. Aortic valve regurgitation is moderate  to severe. Early diastolic flow reversal in the descending thoracic aorta  PW doppler pattern. No aortic stenosis is present.   5. The inferior vena cava is normal in size with greater than 50%  respiratory variability, suggesting right atrial pressure of 3 mmHg.    ZIO 08/2020 Study Highlights   ZIO XT reviewed.  6 days 7 hours analyzed.  Predominant rhythm is sinus with heart rate ranging from 45 bpm up to 148 bpm and average heart rate 73 bpm.  Rare PACs were noted including couplets and triplets representing less than 1% total beats.  Rare PVCs were noted representing less than 1% total beats.  Limited ventricular bigeminy noted.  There were two brief episodes of NSVT lasting no more than 5 beats.  Also three episodes of PSVT lasting no more than 5 beats.  There were no sustained arrhythmias or pauses.  Risk Assessment/Calculations:         ASSESSMENT:    1. Atherosclerosis of native coronary artery of native heart without angina pectoris   2. Essential hypertension   3. Mixed hyperlipidemia   4. Palpitations   5. Leg cramps      PLAN:  In order of problems listed above:  CAD with prior DES RCA 2013, DES LAD 2014 most recent cath 10/23/20 was found to have total occlusion of RCA within the previously placed stent, LAD proximal stent diffuse ISR with 50 to 70% restenosis eccentric RFR 0.92 medical management recommended.  Patient signed out AMA.  Continued chest pain when under stress but not with exertion. No changes  today.   Essential hypertension blood pressure much better controlled.  Continue current medications.   Hyperlipidemia LDL 146 07/03/2020 started on Lipitor 40 mg once daily.  Developed abdominal pain and hives on Lipitor and referred to lipid clinic. Appt in Dec.  Palpitations with PSVT and NSVT on Holter monitor 08/2020 on metoprolol 25 mg twice daily.  I told her she could take an extra 1 as needed for fast heart rates   Leg cramps-check labs today  Shared Decision Making/Informed Consent        Medication Adjustments/Labs and Tests Ordered: Current medicines are reviewed at length with the patient today.  Concerns regarding medicines are outlined above.  Medication changes, Labs and Tests ordered today are listed in the Patient Instructions below. Patient Instructions  Medication Instructions:  Your physician recommends that you continue on your current medications as directed. Please refer to the Current Medication list given to you today.  *If you need a refill on your cardiac medications before your next appointment, please call your pharmacy*   Lab Work: Cbc,bmet , magnesium today  If you have labs (blood work) drawn today and your tests are completely normal, you will receive your results only by: [place] (if you have MyChart) OR A paper copy in the mail If you have any lab test that is abnormal or we need to change your treatment, we will call you to review the results.   Testing/Procedures: None today    Follow-Up: At [facility], you and your health needs are our priority.  As part of our continuing mission to provide you with exceptional heart care, we have created designated Provider Care Teams.  These Care Teams include your primary Cardiologist (physician) and Advanced Practice Providers (APPs -  Physician Assistants and Nurse Practitioners) who all work together to provide you with the care you need, when you need it.  We recommend signing up for the  patient portal called "MyChart".  Sign up information is provided on this After Visit Summary.  MyChart is used to connect with patients for Virtual Visits (Telemedicine).  Patients are able to view lab/test results, encounter notes, upcoming appointments, etc.  Non-urgent messages can be sent to your provider as well.   To learn more about what you can do with MyChart, go to [email or web address].    Your next appointment:  keep apt already scheduled with Dr.McDowell   Signed, Ermalinda Barrios, PA-C  12/29/2020 1:43 PM    [facility], [place], [place]  [address] Phone: [phone]; Fax: [phone]

## 2020-12-17 ENCOUNTER — Other Ambulatory Visit: Payer: Self-pay | Admitting: Cardiology

## 2020-12-18 ENCOUNTER — Other Ambulatory Visit: Payer: Self-pay | Admitting: Cardiology

## 2020-12-29 ENCOUNTER — Encounter: Payer: Self-pay | Admitting: Physician Assistant

## 2020-12-29 ENCOUNTER — Ambulatory Visit (INDEPENDENT_AMBULATORY_CARE_PROVIDER_SITE_OTHER): Payer: Medicare Other | Admitting: Physician Assistant

## 2020-12-29 ENCOUNTER — Other Ambulatory Visit (HOSPITAL_COMMUNITY)
Admission: RE | Admit: 2020-12-29 | Discharge: 2020-12-29 | Disposition: A | Discharge status: Home / Self Care | Payer: Medicare Other | Source: Ambulatory Visit | Attending: Physician Assistant | Admitting: Physician Assistant

## 2020-12-29 ENCOUNTER — Other Ambulatory Visit: Payer: Self-pay

## 2020-12-29 VITALS — BP 118/78 | HR 80 | Ht 64.0 in | Wt 156.4 lb

## 2020-12-29 DIAGNOSIS — I2510 Atherosclerotic heart disease of native coronary artery without angina pectoris: Secondary | ICD-10-CM | POA: Diagnosis present

## 2020-12-29 DIAGNOSIS — E782 Mixed hyperlipidemia: Secondary | ICD-10-CM | POA: Insufficient documentation

## 2020-12-29 DIAGNOSIS — R002 Palpitations: Secondary | ICD-10-CM

## 2020-12-29 DIAGNOSIS — I10 Essential (primary) hypertension: Secondary | ICD-10-CM | POA: Insufficient documentation

## 2020-12-29 DIAGNOSIS — R252 Cramp and spasm: Secondary | ICD-10-CM

## 2020-12-29 LAB — BASIC METABOLIC PANEL
Anion gap: 6 (ref 5–15)
BUN: 9 mg/dL (ref 6–20)
CO2: 27 mmol/L (ref 22–32)
Calcium: 9 mg/dL (ref 8.9–10.3)
Chloride: 106 mmol/L (ref 98–111)
Creatinine, Ser: 0.85 mg/dL (ref 0.44–1.00)
GFR, Estimated: >60 mL/min (ref >60)
Glucose, Bld: 98 mg/dL (ref 70–99)
Potassium: 3.7 mmol/L (ref 3.5–5.1)
Sodium: 139 mmol/L (ref 135–145)

## 2020-12-29 LAB — LIPID PANEL
Cholesterol: 216 mg/dL — ABNORMAL HIGH (ref 0–200)
HDL: 50 mg/dL (ref >40)
LDL Cholesterol: 149 mg/dL — ABNORMAL HIGH (ref 0–99)
Total CHOL/HDL Ratio: 4.3 RATIO
Triglycerides: 86 mg/dL (ref <150)
VLDL: 17 mg/dL (ref 0–40)

## 2020-12-29 LAB — CBC
HCT: 37.8 % (ref 36.0–46.0)
Hemoglobin: 12.4 g/dL (ref 12.0–15.0)
MCH: 28.3 pg (ref 26.0–34.0)
MCHC: 32.8 g/dL (ref 30.0–36.0)
MCV: 86.3 fL (ref 80.0–100.0)
Platelets: 231 10*3/uL (ref 150–400)
RBC: 4.38 MIL/uL (ref 3.87–5.11)
RDW: 14.5 % (ref 11.5–15.5)
WBC: 6.3 10*3/uL (ref 4.0–10.5)
nRBC: 0 % (ref 0.0–0.2)

## 2020-12-29 LAB — MAGNESIUM: Magnesium: 2.2 mg/dL (ref 1.7–2.4)

## 2020-12-29 NOTE — Patient Instructions (Addendum)
Medication Instructions:  Your physician recommends that you continue on your current medications as directed. Please refer to the Current Medication list given to you today.  *If you need a refill on your cardiac medications before your next appointment, please call your pharmacy*   Lab Work: Cbc,bmet , magnesium today  If you have labs (blood work) drawn today and your tests are completely normal, you will receive your results only by: [place] (if you have MyChart) OR A paper copy in the mail If you have any lab test that is abnormal or we need to change your treatment, we will call you to review the results.   Testing/Procedures: None today    Follow-Up: At [facility], you and your health needs are our priority.  As part of our continuing mission to provide you with exceptional heart care, we have created designated Provider Care Teams.  These Care Teams include your primary Cardiologist (physician) and Advanced Practice Providers (APPs -  Physician Assistants and Nurse Practitioners) who all work together to provide you with the care you need, when you need it.  We recommend signing up for the patient portal called "MyChart".  Sign up information is provided on this After Visit Summary.  MyChart is used to connect with patients for Virtual Visits (Telemedicine).  Patients are able to view lab/test results, encounter notes, upcoming appointments, etc.  Non-urgent messages can be sent to your provider as well.   To learn more about what you can do with MyChart, go to [email or web address].    Your next appointment:  keep apt already scheduled with Dr.McDowell

## 2021-01-05 ENCOUNTER — Ambulatory Visit (INDEPENDENT_AMBULATORY_CARE_PROVIDER_SITE_OTHER): Payer: 59 | Admitting: Internal Medicine

## 2021-01-13 ENCOUNTER — Other Ambulatory Visit (HOSPITAL_COMMUNITY)
Admission: RE | Admit: 2021-01-13 | Discharge: 2021-01-13 | Disposition: A | Discharge status: Home / Self Care | Payer: Medicare Other | Source: Ambulatory Visit | Attending: Adult Health | Admitting: Adult Health

## 2021-01-13 ENCOUNTER — Ambulatory Visit (INDEPENDENT_AMBULATORY_CARE_PROVIDER_SITE_OTHER): Payer: Medicare Other | Admitting: Adult Health

## 2021-01-13 ENCOUNTER — Encounter: Payer: Self-pay | Admitting: Adult Health

## 2021-01-13 ENCOUNTER — Other Ambulatory Visit: Payer: Self-pay

## 2021-01-13 VITALS — BP 179/92 | HR 68 | Ht 64.0 in | Wt 159.0 lb

## 2021-01-13 DIAGNOSIS — Z113 Encounter for screening for infections with a predominantly sexual mode of transmission: Secondary | ICD-10-CM | POA: Diagnosis present

## 2021-01-13 DIAGNOSIS — Z1211 Encounter for screening for malignant neoplasm of colon: Secondary | ICD-10-CM | POA: Diagnosis not present

## 2021-01-13 DIAGNOSIS — N898 Other specified noninflammatory disorders of vagina: Secondary | ICD-10-CM | POA: Insufficient documentation

## 2021-01-13 DIAGNOSIS — Z01419 Encounter for gynecological examination (general) (routine) without abnormal findings: Secondary | ICD-10-CM | POA: Diagnosis not present

## 2021-01-13 DIAGNOSIS — Z5321 Procedure and treatment not carried out due to patient leaving prior to being seen by health care provider: Secondary | ICD-10-CM | POA: Insufficient documentation

## 2021-01-13 DIAGNOSIS — I10 Essential (primary) hypertension: Secondary | ICD-10-CM

## 2021-01-13 DIAGNOSIS — Z90710 Acquired absence of both cervix and uterus: Secondary | ICD-10-CM | POA: Diagnosis not present

## 2021-01-13 DIAGNOSIS — R350 Frequency of micturition: Secondary | ICD-10-CM | POA: Insufficient documentation

## 2021-01-13 LAB — HEMOCCULT GUIAC POC 1CARD (OFFICE): Fecal Occult Blood, POC: NEGATIVE

## 2021-01-13 NOTE — Progress Notes (Signed)
Patient ID: Bethany Wang, female   DOB: 03-21-1966, 55 y.o.   MRN: [id number] History of Present Illness: Bethany Wang is a 55 year old black female,married, sp hysterectomy in for a well woman gyn exam. She says she is having surgery on heart, 02/06/21 for valve issue.  PCP is Dr Quillian Quince, and she sees Dr Tamala Julian, cardiology.   Current Medications, Allergies, Past Medical History, Past Surgical History, Family History and Social History were reviewed in [employer] record.     Review of Systems: Patient denies any headaches, hearing loss, fatigue, blurred vision, shortness of breath, chest pain, abdominal pain, problems with bowel movements,  or intercourse. (not having sex).No joint pain or mood swings.  Has urinary frequency, goes about every 15 minutes she says   Physical Exam:BP (!) 179/92 (BP Location: Left Arm, Patient Position: Sitting, Cuff Size: Normal)   Pulse 68   Ht 5\' 4"  (1.626 m)   Wt 159 lb (72.1 kg)   BMI 27.29 kg/m   General:  Well developed, well nourished, no acute distress Skin:  Warm and dry Neck:  Midline trachea, normal thyroid, good ROM, no lymphadenopathy Lungs; Clear to auscultation bilaterally Breast:  No dominant palpable mass, retraction, or nipple discharge,has scar left breast from lumpectomy Cardiovascular: Regular rate and rhythm Abdomen:  Soft, non tender, no hepatosplenomegaly Pelvic:  External genitalia is normal in appearance, no lesions.  The vagina is normal in appearance, has creamy discharge, with slight odor, CV swab obtained.[name] Urethra has no lesions or masses. The cervix and uterus are absent. No adnexal masses or tenderness noted.Bladder is non tender, no masses felt. Rectal: Good sphincter tone, no polyps, or hemorrhoids felt.  Hemoccult negative. Extremities/musculoskeletal:  No swelling or varicosities noted, no clubbing or cyanosis Psych:  No mood changes, alert and cooperative,seems happy AA is 0  Fall risk is  low Depression screen [facility] 2/9 01/13/2021 08/18/2020 07/08/2020  Decreased Interest 0 0 0  Down, Depressed, Hopeless 0 0 0  PHQ - 2 Score 0 0 0  Altered sleeping 3 0 0  Tired, decreased energy 3 0 0  Change in appetite 3 0 0  Feeling bad or failure about yourself  0 0 0  Trouble concentrating 0 0 0  Moving slowly or fidgety/restless 0 0 0  Suicidal thoughts 0 0 0  PHQ-9 Score 9 0 0  Difficult doing work/chores - Not difficult at all Not difficult at all  Some recent data might be hidden   She is on lexapro GAD 7 : Generalized Anxiety Score 01/13/2021 08/15/2019  Nervous, Anxious, on Edge 0 2  Control/stop worrying 0 2  Worry too much - different things 0 3  Trouble relaxing 0 3  Restless 0 1  Easily annoyed or irritable 0 1  Afraid - awful might happen 0 0  Total GAD 7 Score 0 12  Anxiety Difficulty - Very difficult      [place] - 01/13/21 0935       Pregnancy Intention Screening   Does the patient want to become pregnant in the next year? N/A    Does the patient's partner want to become pregnant in the next year? N/A    Would the patient like to discuss contraceptive options today? N/A      Contraception Wrap Up   Current Method --   hyst   End Method --   hyst   Contraception Counseling Provided No  Examination chaperoned by Bethany Squibb LPN   Impression and Plan: 1. Encounter for well woman exam with routine gynecological exam Physical in 1 year Mammogram yearly Labs with PCP and Cardiologist Colonoscopy per Dr Laural Golden  2. Encounter for screening fecal occult blood testing  3. S/P hysterectomy  4. Vaginal discharge CV swab sent   5. Screening examination for STD (sexually transmitted disease) CV swab sent for GC/CHL and trich,BV and yeast   6. Urinary frequency Referred to Dr Felipa Eth  7. Hypertension, unspecified type Continue BP and follow up with Dr Tamala Julian

## 2021-01-14 ENCOUNTER — Other Ambulatory Visit: Payer: Self-pay | Admitting: Cardiology

## 2021-01-15 LAB — CERVICOVAGINAL ANCILLARY ONLY
Bacterial Vaginitis (gardnerella): POSITIVE — AB
Candida Glabrata: NEGATIVE
Candida Vaginitis: NEGATIVE
Chlamydia: NEGATIVE
Comment: NEGATIVE
Comment: NEGATIVE
Comment: NEGATIVE
Comment: NEGATIVE
Comment: NEGATIVE
Comment: NORMAL
Neisseria Gonorrhea: NEGATIVE
Trichomonas: NEGATIVE

## 2021-01-16 ENCOUNTER — Other Ambulatory Visit: Payer: Self-pay | Admitting: Adult Health

## 2021-01-16 MED ORDER — METRONIDAZOLE 500 MG PO TABS: 500.0000 mg | ORAL_TABLET | Freq: Two times a day (BID) | ORAL | 0 refills | Status: DC

## 2021-01-16 NOTE — Progress Notes (Signed)
+  BV on vaginal swab, will rx flagyl, no sex or alcohol during treatment

## 2021-01-30 ENCOUNTER — Other Ambulatory Visit: Payer: Self-pay

## 2021-01-30 ENCOUNTER — Ambulatory Visit (INDEPENDENT_AMBULATORY_CARE_PROVIDER_SITE_OTHER): Payer: Medicare Other | Admitting: Urology

## 2021-01-30 ENCOUNTER — Encounter: Payer: Self-pay | Admitting: Urology

## 2021-01-30 VITALS — BP 176/80 | HR 60

## 2021-01-30 DIAGNOSIS — R350 Frequency of micturition: Secondary | ICD-10-CM

## 2021-01-30 LAB — URINALYSIS, ROUTINE W REFLEX MICROSCOPIC
Bilirubin, UA: NEGATIVE
Glucose, UA: NEGATIVE
Leukocytes,UA: NEGATIVE
Nitrite, UA: NEGATIVE
RBC, UA: NEGATIVE
Specific Gravity, UA: 1.025 (ref 1.005–1.030)
Urobilinogen, Ur: 1 mg/dL (ref 0.2–1.0)
pH, UA: 5.5 (ref 5.0–7.5)

## 2021-01-30 LAB — BLADDER SCAN AMB NON-IMAGING: Scan Result: 0

## 2021-01-30 MED ORDER — GEMTESA 75 MG PO TABS: 75.0000 mg | ORAL_TABLET | Freq: Every day | ORAL | 0 refills | Status: DC

## 2021-01-30 NOTE — Progress Notes (Signed)
Assessment: 1. Urinary frequency    Plan: I discussed possible causes of urinary frequency.  I recommended that she avoid dietary irritants including caffeine.  I also discussed the possible association of her urinary frequency with her significant fluid intake. Trial of Gemtesa 75 mg daily.  Samples given. Voiding diary Return to office in 4 weeks  Chief Complaint:  Chief Complaint  Patient presents with   Urinary Frequency    History of Present Illness:  Bethany Wang is a 55 y.o. year old female who is seen in consultation from Caryl Bis, MD  for evaluation of urinary frequency.  She reports symptoms of urinary frequency for approximately 8 months.  She states that this began after passage of a ureteral calculus.  She was diagnosed with a 3 mm left mid ureteral calculus in 12/21.  She was scheduled to undergo lithotripsy but apparently passed the stone spontaneously.  A repeat CT scan from 2/22 was negative for stones.  Since passage of the stone she reports significant urinary frequency voiding 10-15 times per day.  She has nocturia 4-5 times per night.  She also has urge incontinence.  She does have significant fluid intake throughout the day.  She reports drinking 2 pots of decaffeinated coffee in the morning and at least 1-1/2 gallons of water per day.  She uses always pads for management of her incontinence.  No dysuria or gross hematuria.  No recent UTIs.   Past Medical History:  Past Medical History:  Diagnosis Date   Anemia    Anxiety    Bilateral breast cysts 12/30/2015   Brain tumor ([place])    Breast cancer ([place])    Left Breast Cancer   Breast disorder    BV (bacterial vaginosis) 09/09/2015   Coronary atherosclerosis of native coronary artery    a. s/p DES to RCA in 2013 b. DES to LAD in 2014 c. cath in 04/2016 showing patent LAD stent with D2 jailed by LAD stent and CTO of RCA with left to right collaterals present   Cyst of pharynx or nasopharynx     [name]   Decreased libido 12/25/2018   Depression    Diabetes mellitus without complication ([place])    Essential hypertension    Falls    x 3-4 in past year   GERD (gastroesophageal reflux disease)    Headache    History of hematuria    Mixed hyperlipidemia    Personal history of chemotherapy    Left Breast Cancer   Personal history of radiation therapy    Left Breast Cancer   Pre-diabetes    PUD (peptic ulcer disease)    Seizures ([place])    Shingles 09/09/2015   Stroke ([place])    12-2017 on Plavix, only deficit is headaches   Suicide attempt ([place])    3 attempts in remote past   Trichimoniasis 08/20/2019   Treated 08/20/19    Uterine cancer ([place])    Vitamin D deficiency disease 04/03/2019    Past Surgical History:  Past Surgical History:  Procedure Laterality Date   ABDOMINAL HYSTERECTOMY     BIOPSY  11/28/2019   Procedure: BIOPSY;  Surgeon: Rogene Houston, MD;  [facility];  Service: Endoscopy;;  antral, gastric body    BLADDER SURGERY     BREAST LUMPECTOMY Left    BREAST LUMPECTOMY Right 02/24/2018   Procedure: RIGHT BREAST LUMPECTOMY ERAS PATHWAY;  Surgeon: Jovita Kussmaul, MD;  Location: [facility];  Service:  General;  Laterality: Right;   CARDIAC CATHETERIZATION N/A 05/10/2016   Procedure: Left Heart Cath and Coronary Angiography;  Surgeon: Belva Crome, MD;  Location: [facility];  Service: Cardiovascular;  Laterality: N/A;   CAUTERIZE INNER NOSE  07/13/2020   COLONOSCOPY  May 2010   [place]: normal rectum, internal hemorrhoids, , benign colonic polyp   COLONOSCOPY N/A 01/05/2017   Procedure: COLONOSCOPY;  Surgeon: Rogene Houston, MD;  [facility];  Service: Endoscopy;  Laterality: N/A;  1:00   ESOPHAGEAL DILATION  11/28/2019   Procedure: ESOPHAGEAL DILATION;  Surgeon: Rogene Houston, MD;  [facility];  Service: Endoscopy;;   ESOPHAGOGASTRODUODENOSCOPY     2001 Dr. Amedeo Plenty: distal esophagitis, small hiatal  hernia,. Dr. Tamala Julian 2006? no records available currently, pt also reports  EGD a few years ago with Dr. Gala Romney, do not have these reports anywhere in medical records   ESOPHAGOGASTRODUODENOSCOPY  06/02/2011   IDP:OEUMPN pill impaction as described above s/p dilation of a probable cervical esophageal web/bx abnormal esophageal and gastric mucosa. + H.pylori gastritis    ESOPHAGOGASTRODUODENOSCOPY (EGD) WITH PROPOFOL N/A 11/28/2019   Procedure: ESOPHAGOGASTRODUODENOSCOPY (EGD) WITH PROPOFOL;  Surgeon: Rogene Houston, MD;  [facility];  Service: Endoscopy;  Laterality: N/A;  1020   EYE SURGERY     Removed glass   HAND SURGERY Right    INTRAVASCULAR PRESSURE WIRE/FFR STUDY N/A 10/23/2020   Procedure: INTRAVASCULAR PRESSURE WIRE/FFR STUDY;  Surgeon: Belva Crome, MD;  Location: [facility];  Service: Cardiovascular;  Laterality: N/A;   Left breast lumpectomy     Benign   LEFT HEART CATH AND CORONARY ANGIOGRAPHY N/A 10/25/2018   Procedure: LEFT HEART CATH AND CORONARY ANGIOGRAPHY;  Surgeon: Wellington Hampshire, MD;  Location: [facility];  Service: Cardiovascular;  Laterality: N/A;   LEFT HEART CATH AND CORONARY ANGIOGRAPHY N/A 10/23/2020   Procedure: LEFT HEART CATH AND CORONARY ANGIOGRAPHY;  Surgeon: Belva Crome, MD;  Location: [facility];  Service: Cardiovascular;  Laterality: N/A;   LEFT HEART CATHETERIZATION WITH CORONARY ANGIOGRAM N/A 07/03/2014   Procedure: LEFT HEART CATHETERIZATION WITH CORONARY ANGIOGRAM;  Surgeon: Belva Crome, MD;  [facility]: [facility];  Service: Cardiovascular;  Laterality: N/A;   POLYPECTOMY  01/05/2017   Procedure: POLYPECTOMY;  Surgeon: Rogene Houston, MD;  [facility];  Service: Endoscopy;;  colon   SHOULDER SURGERY Left     Allergies:  Allergies  Allergen Reactions   Bee Venom Anaphylaxis and Other (See Comments)    "throat swelled and had to the hospital" - Yellow jacket sting   Lipitor [Atorvastatin] Rash and  Other (See Comments)    Liver damage & Upper Abdominal pain, Break out like a rash . Per last hosp visit. It exposed on inpatient stay.   Penicillins Anaphylaxis and Other (See Comments)    Has patient had a PCN reaction causing immediate rash, facial/tongue/throat swelling, SOB or lightheadedness with hypotension: Yes Has patient had a PCN reaction causing severe rash involving mucus membranes or skin necrosis: Yes Has patient had a PCN reaction that required hospitalization Yes Has patient had a PCN reaction occurring within the last 10 years: Yes If all of the above answers are "NO", then may proceed with Cephalosporin use. Patient states she carries an epi-pen   Shellfish Allergy Swelling   Tomato Itching and Swelling   Zofran [Ondansetron] Itching    Family History:  Family History  Problem Relation Age of Onset   Colon cancer Paternal Grandfather    Cancer Father    Cancer Maternal Uncle    Alzheimer's disease Paternal Aunt    Cirrhosis Maternal Uncle    Cancer Paternal Aunt        lung   Aneurysm Mother        brain   Heart disease Brother    Heart attack Brother    Mental illness Daughter    ADD / ADHD Daughter    Bipolar disorder Daughter    Mental illness Son    ADD / ADHD Son    Bipolar disorder Son    Mental illness Son    ADD / ADHD Son    Bipolar disorder Son     Social History:  Social History   Tobacco Use   Smoking status: Former    Packs/day: 0.20    Years: 32.00    Pack years: 6.40    Types: Cigarettes, Cigars    Start date: 07/05/1983    Quit date: 04/03/2012    Years since quitting: 8.8   Smokeless tobacco: Never  Vaping Use   Vaping Use: Never used  Substance Use Topics   Alcohol use: No    Alcohol/week: 0.0 standard drinks   Drug use: No    Review of symptoms:  Constitutional:  Negative for unexplained weight loss, night sweats, fever, chills ENT:  Negative for nose bleeds, sinus pain, painful swallowing CV:  Negative for chest  pain, shortness of breath, exercise intolerance, palpitations, loss of consciousness Resp:  Negative for cough, wheezing, shortness of breath GI:  Negative for nausea, vomiting, diarrhea, bloody stools GU:  Positives noted in HPI; otherwise negative for gross hematuria, dysuria Neuro:  Negative for seizures, poor balance, limb weakness, slurred speech Psych:  Negative for lack of energy, depression, anxiety Endocrine:  Negative for polydipsia, polyuria, symptoms of hypoglycemia (dizziness, hunger, sweating) Hematologic:  Negative for anemia, purpura, petechia, prolonged or excessive bleeding, use of anticoagulants  Allergic:  Negative for difficulty breathing or choking as a result of exposure to anything; no shellfish allergy; no allergic response (rash/itch) to materials, foods  Physical exam: BP (!) 176/80   Pulse 60  GENERAL APPEARANCE:  Well appearing, well developed, well nourished, NAD HEENT: Atraumatic, Normocephalic, oropharynx clear. NECK: Supple without lymphadenopathy or thyromegaly. LUNGS: Clear to auscultation bilaterally. HEART: Regular Rate and Rhythm without murmurs, gallops, or rubs. ABDOMEN: Soft, non-tender, No Masses. EXTREMITIES: Moves all extremities well.  Without clubbing, cyanosis, or edema. NEUROLOGIC:  Alert and oriented x 3, normal gait, CN II-XII grossly intact.  MENTAL STATUS:  Appropriate. BACK:  Non-tender to palpation.  No CVAT SKIN:  Warm, dry and intact.    Results: Results for orders placed or performed in visit on 01/30/21 (from the past 24 hour(s))  BLADDER SCAN AMB NON-IMAGING   Collection Time: 01/30/21 11:35 AM  Result Value Ref Range   Scan Result 0    Results for orders placed or performed in visit on 01/30/21 (from the past 24 hour(s))  Urinalysis, Routine w reflex microscopic     Status: Abnormal   Collection Time: 01/30/21 12:36 PM  Result Value Ref Range   Specific Gravity, UA 1.025 1.005 - 1.030   pH, UA 5.5 5.0 - 7.5   Color, UA  Amber (A) Yellow   Appearance Ur Clear Clear   Leukocytes,UA Negative Negative   Protein,UA Trace (A) Negative/Trace   Glucose, UA Negative Negative   Ketones, UA Trace (  A) Negative   RBC, UA Negative Negative   Bilirubin, UA Negative Negative   Urobilinogen, Ur 1.0 0.2 - 1.0 mg/dL   Nitrite, UA Negative Negative   Microscopic Examination Comment    Narrative   Performed at:  [facility] [address], [place], [place]  [id number] Lab Director: [place], Phone:  [phone]

## 2021-01-30 NOTE — Progress Notes (Signed)
Urological Symptom Review  Patient is experiencing the following symptoms: Frequent urination   Review of Systems  Gastrointestinal (upper)  : Negative for upper GI symptoms  Gastrointestinal (lower) : Negative for lower GI symptoms  Constitutional : Negative for symptoms  Skin: Negative for skin symptoms  Eyes: Negative for eye symptoms  Ear/Nose/Throat : Negative for Ear/Nose/Throat symptoms  Hematologic/Lymphatic: Negative for Hematologic/Lymphatic symptoms  Cardiovascular : Negative for cardiovascular symptoms  Respiratory : Negative for respiratory symptoms  Endocrine: Negative for endocrine symptoms  Musculoskeletal: Negative for musculoskeletal symptoms  Neurological: Negative for neurological symptoms  Psychologic: Negative for psychiatric symptoms 

## 2021-01-30 NOTE — Progress Notes (Signed)
post void residual=0 ?

## 2021-02-03 ENCOUNTER — Other Ambulatory Visit: Payer: Self-pay | Admitting: Physician Assistant

## 2021-02-11 DIAGNOSIS — I10 Essential (primary) hypertension: Secondary | ICD-10-CM | POA: Diagnosis not present

## 2021-02-11 DIAGNOSIS — R739 Hyperglycemia, unspecified: Secondary | ICD-10-CM | POA: Diagnosis not present

## 2021-02-11 DIAGNOSIS — E785 Hyperlipidemia, unspecified: Secondary | ICD-10-CM | POA: Diagnosis not present

## 2021-02-11 DIAGNOSIS — E039 Hypothyroidism, unspecified: Secondary | ICD-10-CM | POA: Diagnosis not present

## 2021-02-11 DIAGNOSIS — I2510 Atherosclerotic heart disease of native coronary artery without angina pectoris: Secondary | ICD-10-CM | POA: Diagnosis not present

## 2021-02-11 DIAGNOSIS — E559 Vitamin D deficiency, unspecified: Secondary | ICD-10-CM | POA: Diagnosis not present

## 2021-02-17 ENCOUNTER — Emergency Department (HOSPITAL_COMMUNITY): Payer: Medicare Other

## 2021-02-17 ENCOUNTER — Other Ambulatory Visit: Payer: Self-pay

## 2021-02-17 ENCOUNTER — Emergency Department (HOSPITAL_COMMUNITY)
Admission: EM | Admit: 2021-02-17 | Discharge: 2021-02-17 | Disposition: A | Discharge status: Home / Self Care | Payer: Medicare Other | Attending: Emergency Medicine | Admitting: Emergency Medicine

## 2021-02-17 ENCOUNTER — Encounter (HOSPITAL_COMMUNITY): Payer: Self-pay | Admitting: *Deleted

## 2021-02-17 DIAGNOSIS — Z79899 Other long term (current) drug therapy: Secondary | ICD-10-CM | POA: Insufficient documentation

## 2021-02-17 DIAGNOSIS — Z7982 Long term (current) use of aspirin: Secondary | ICD-10-CM | POA: Diagnosis not present

## 2021-02-17 DIAGNOSIS — R0600 Dyspnea, unspecified: Secondary | ICD-10-CM | POA: Diagnosis not present

## 2021-02-17 DIAGNOSIS — Z8542 Personal history of malignant neoplasm of other parts of uterus: Secondary | ICD-10-CM | POA: Insufficient documentation

## 2021-02-17 DIAGNOSIS — R202 Paresthesia of skin: Secondary | ICD-10-CM | POA: Insufficient documentation

## 2021-02-17 DIAGNOSIS — R0789 Other chest pain: Secondary | ICD-10-CM | POA: Insufficient documentation

## 2021-02-17 DIAGNOSIS — R079 Chest pain, unspecified: Secondary | ICD-10-CM | POA: Diagnosis not present

## 2021-02-17 DIAGNOSIS — I2510 Atherosclerotic heart disease of native coronary artery without angina pectoris: Secondary | ICD-10-CM | POA: Diagnosis not present

## 2021-02-17 DIAGNOSIS — E119 Type 2 diabetes mellitus without complications: Secondary | ICD-10-CM | POA: Insufficient documentation

## 2021-02-17 DIAGNOSIS — I517 Cardiomegaly: Secondary | ICD-10-CM | POA: Diagnosis not present

## 2021-02-17 DIAGNOSIS — I10 Essential (primary) hypertension: Secondary | ICD-10-CM | POA: Insufficient documentation

## 2021-02-17 DIAGNOSIS — Z853 Personal history of malignant neoplasm of breast: Secondary | ICD-10-CM | POA: Insufficient documentation

## 2021-02-17 DIAGNOSIS — Z87891 Personal history of nicotine dependence: Secondary | ICD-10-CM | POA: Insufficient documentation

## 2021-02-17 DIAGNOSIS — E039 Hypothyroidism, unspecified: Secondary | ICD-10-CM | POA: Diagnosis not present

## 2021-02-17 DIAGNOSIS — E785 Hyperlipidemia, unspecified: Secondary | ICD-10-CM | POA: Diagnosis not present

## 2021-02-17 DIAGNOSIS — R29818 Other symptoms and signs involving the nervous system: Secondary | ICD-10-CM | POA: Diagnosis not present

## 2021-02-17 HISTORY — DX: Acute myocardial infarction, unspecified: I21.9

## 2021-02-17 LAB — TROPONIN I (HIGH SENSITIVITY)
Troponin I (High Sensitivity): 4 ng/L (ref <18)
Troponin I (High Sensitivity): 4 ng/L (ref <18)

## 2021-02-17 LAB — BASIC METABOLIC PANEL
Anion gap: 7 (ref 5–15)
BUN: 15 mg/dL (ref 6–20)
CO2: 25 mmol/L (ref 22–32)
Calcium: 9.4 mg/dL (ref 8.9–10.3)
Chloride: 105 mmol/L (ref 98–111)
Creatinine, Ser: 0.72 mg/dL (ref 0.44–1.00)
GFR, Estimated: >60 mL/min (ref >60)
Glucose, Bld: 97 mg/dL (ref 70–99)
Potassium: 3.9 mmol/L (ref 3.5–5.1)
Sodium: 137 mmol/L (ref 135–145)

## 2021-02-17 LAB — PROTIME-INR
INR: 0.9 (ref 0.8–1.2)
Prothrombin Time: 11.9 seconds (ref 11.4–15.2)

## 2021-02-17 LAB — CBG MONITORING, ED: Glucose-Capillary: 101 mg/dL — ABNORMAL HIGH (ref 70–99)

## 2021-02-17 LAB — CBC
HCT: 41.3 % (ref 36.0–46.0)
Hemoglobin: 13.4 g/dL (ref 12.0–15.0)
MCH: 27.9 pg (ref 26.0–34.0)
MCHC: 32.4 g/dL (ref 30.0–36.0)
MCV: 86 fL (ref 80.0–100.0)
Platelets: 246 10*3/uL (ref 150–400)
RBC: 4.8 MIL/uL (ref 3.87–5.11)
RDW: 14.2 % (ref 11.5–15.5)
WBC: 7.2 10*3/uL (ref 4.0–10.5)
nRBC: 0 % (ref 0.0–0.2)

## 2021-02-17 NOTE — ED Provider Notes (Signed)
[facility] EMERGENCY DEPARTMENT Provider Note   CSN: [id number] Arrival date & time: 02/17/21  1006     History Chief Complaint  Patient presents with   Chest Pain    Bethany Wang is a 55 y.o. female.   Chest Pain Associated symptoms: numbness   Associated symptoms: no abdominal pain, no back pain, no cough, no dizziness, no dysphagia, no fatigue, no fever, no headache, no nausea, no palpitations, no shortness of breath, no vomiting and no weakness        Bethany Wang is a 55 y.o. female with past medical history significant for coronary artery disease with stent placement x2 in 2013 and stent placement x2 in 2014, hypertension, hyperlipidemia, CVA 2019, and who presents to the Emergency Department under recommendation of PCP, Dr. Jimmye Norman of [facility] in [place].  Patient was in his office this morning and sent here for further evaluation.  Patient reports intermittent chest pain that began on Sunday.  Pain relieved after taking 4 nitroglycerin 5 minutes apart along with aspirin.  She had similar episode on Monday and she again took 4 nitroglycerin and aspirin with relief.  No chest pain today.  She also notes having left arm numbness and tingling from her shoulder area to her fingertips and feeling as though her left face "is about ready to droop."  She notes having these symptoms intermittently since prior stroke from 2019.  She also notes having some exertional dyspnea.  She states she is only here because her PCP advised her to come.  She denies any diaphoresis, lower extremity weakness, deficits of speech or thought, nausea or vomiting.  She admits to increased stressors at home     Cardiology is Dr. Domenic Polite and Dr. Debara Pickett  Past Medical History:  Diagnosis Date   Anemia    Anxiety    Bilateral breast cysts 12/30/2015   Brain tumor ([place])    Breast cancer ([place])    Left Breast Cancer   Breast disorder    BV (bacterial vaginosis) 09/09/2015   Coronary atherosclerosis of  native coronary artery    a. s/p DES to RCA in 2013 b. DES to LAD in 2014 c. cath in 04/2016 showing patent LAD stent with D2 jailed by LAD stent and CTO of RCA with left to right collaterals present   Cyst of pharynx or nasopharynx    [name]   Decreased libido 12/25/2018   Depression    Diabetes mellitus without complication ([place])    Essential hypertension    Falls    x 3-4 in past year   GERD (gastroesophageal reflux disease)    Headache    Heart attack ([place])    x4   History of hematuria    Mixed hyperlipidemia    Personal history of chemotherapy    Left Breast Cancer   Personal history of radiation therapy    Left Breast Cancer   Pre-diabetes    PUD (peptic ulcer disease)    Seizures ([place])    Shingles 09/09/2015   Stroke ([place])    12-2017 on Plavix, only deficit is headaches   Suicide attempt ([place])    3 attempts in remote past   Trichimoniasis 08/20/2019   Treated 08/20/19    Uterine cancer ([place])    Vitamin D deficiency disease 04/03/2019    Patient Active Problem List   Diagnosis Date Noted   Encounter for screening fecal occult blood testing 01/13/2021   Encounter for well woman exam with routine gynecological  exam 01/13/2021   S/P hysterectomy 01/13/2021   Urinary frequency 01/13/2021   Loss of weight 05/20/2020   Abdominal pain 05/20/2020   Trichimoniasis 08/20/2019   Screening examination for STD (sexually transmitted disease) 08/15/2019   Vaginal itching 08/15/2019   Hypertension 08/15/2019   Vitamin D deficiency disease 04/03/2019   Decreased libido 12/25/2018   Angina pectoris ([place]) 10/25/2018   Palpitations 06/06/2018   Chest pain 07/13/2017   Nonspecific chest pain 07/13/2017   Mixed hyperlipidemia 07/13/2017   Precordial chest pain    Hypertensive urgency 06/15/2016   Unstable angina pectoris ([place]) 06/15/2016   Unstable angina ([place]) 06/15/2016   Bilateral breast cysts 12/30/2015   Vaginal discharge 09/09/2015   BV (bacterial  vaginosis) 09/09/2015   Shingles 09/09/2015   History of breast cancer 09/09/2015   Syncope and collapse 08/08/2015   Migraine with aura and with status migrainosus, not intractable 08/08/2015   Chronic low back pain 08/08/2015   Insomnia 08/08/2015   Anxiety state 08/08/2015   Panic attacks 08/08/2015   Anemia, iron deficiency 08/08/2015   Pica 08/08/2015   Angina decubitus ([place]) 07/03/2014   Vertebrobasilar artery syndrome 03/06/2014   Encounter for gynecological examination with Papanicolaou smear of cervix 02/04/2014   Syncope 12/14/2013   Cervical disc disorder with radiculopathy of cervical region 02/19/2013   H/O rotator cuff surgery 02/19/2013   Dyspareunia 02/01/2013   Left shoulder pain 01/24/2013   Rotator cuff tear 01/24/2013   Radicular pain 01/24/2013   Labral tear of shoulder 01/24/2013   Complex regional pain syndrome of upper extremity 01/24/2013   Herniated disc, cervical 01/24/2013   Nausea and vomiting 01/18/2013   Rectal bleeding 01/18/2013   Edema of left foot 10/16/2012   Coronary atherosclerosis of native coronary artery    Essential hypertension, benign    HLD (hyperlipidemia)    Tobacco abuse     Past Surgical History:  Procedure Laterality Date   ABDOMINAL HYSTERECTOMY     BIOPSY  11/28/2019   Procedure: BIOPSY;  Surgeon: Rogene Houston, MD;  [facility];  Service: Endoscopy;;  antral, gastric body    BLADDER SURGERY     BREAST LUMPECTOMY Left    BREAST LUMPECTOMY Right 02/24/2018   Procedure: RIGHT BREAST LUMPECTOMY ERAS PATHWAY;  Surgeon: Jovita Kussmaul, MD;  Location: [facility] OR;  Service: General;  Laterality: Right;   CARDIAC CATHETERIZATION N/A 05/10/2016   Procedure: Left Heart Cath and Coronary Angiography;  Surgeon: Belva Crome, MD;  Location: [place] [facility] LAB;  Service: Cardiovascular;  Laterality: N/A;   CAUTERIZE INNER NOSE  07/13/2020   COLONOSCOPY  May 2010   Fleishman: normal rectum, internal hemorrhoids, , benign  colonic polyp   COLONOSCOPY N/A 01/05/2017   Procedure: COLONOSCOPY;  Surgeon: Rogene Houston, MD;  Location: [facility];  Service: Endoscopy;  Laterality: N/A;  1:00   ESOPHAGEAL DILATION  11/28/2019   Procedure: ESOPHAGEAL DILATION;  Surgeon: Rogene Houston, MD;  [facility]: [facility];  Service: Endoscopy;;   ESOPHAGOGASTRODUODENOSCOPY     2001 Dr. Amedeo Plenty: distal esophagitis, small hiatal hernia,. Dr. Tamala Julian 2006? no records available currently, pt also reports  EGD a few years ago with Dr. Gala Romney, do not have these reports anywhere in medical records   ESOPHAGOGASTRODUODENOSCOPY  06/02/2011   POE:UMPNTI pill impaction as described above s/p dilation of a probable cervical esophageal web/bx abnormal esophageal and gastric mucosa. + H.pylori gastritis    ESOPHAGOGASTRODUODENOSCOPY (EGD) WITH PROPOFOL N/A 11/28/2019   Procedure:  ESOPHAGOGASTRODUODENOSCOPY (EGD) WITH PROPOFOL;  Surgeon: Rogene Houston, MD;  [facility];  Service: Endoscopy;  Laterality: N/A;  1020   EYE SURGERY     Removed glass   HAND SURGERY Right    INTRAVASCULAR PRESSURE WIRE/FFR STUDY N/A 10/23/2020   Procedure: INTRAVASCULAR PRESSURE WIRE/FFR STUDY;  Surgeon: Belva Crome, MD;  Location: [place] [facility];  Service: Cardiovascular;  Laterality: N/A;   Left breast lumpectomy     Benign   LEFT HEART CATH AND CORONARY ANGIOGRAPHY N/A 10/25/2018   Procedure: LEFT HEART CATH AND CORONARY ANGIOGRAPHY;  Surgeon: Wellington Hampshire, MD;  Location: [place] [facility];  Service: Cardiovascular;  Laterality: N/A;   LEFT HEART CATH AND CORONARY ANGIOGRAPHY N/A 10/23/2020   Procedure: LEFT HEART CATH AND CORONARY ANGIOGRAPHY;  Surgeon: Belva Crome, MD;  Location: [place] [facility];  Service: Cardiovascular;  Laterality: N/A;   LEFT HEART CATHETERIZATION WITH CORONARY ANGIOGRAM N/A 07/03/2014   Procedure: LEFT HEART CATHETERIZATION WITH CORONARY ANGIOGRAM;  Surgeon: Belva Crome, MD;  Location: [facility] CATH LAB;   Service: Cardiovascular;  Laterality: N/A;   POLYPECTOMY  01/05/2017   Procedure: POLYPECTOMY;  Surgeon: Rogene Houston, MD;  [facility];  Service: Endoscopy;;  colon   SHOULDER SURGERY Left      OB History     Gravida  4   Para  3   Term      Preterm      AB  1   Living  3      SAB  1   IAB      Ectopic      Multiple      Live Births              Family History  Problem Relation Age of Onset   Colon cancer Paternal Grandfather    Cancer Father    Cancer Maternal Uncle    Alzheimer's disease Paternal Aunt    Cirrhosis Maternal Uncle    Cancer Paternal Aunt        lung   Aneurysm Mother        brain   Heart disease Brother    Heart attack Brother    Mental illness Daughter    ADD / ADHD Daughter    Bipolar disorder Daughter    Mental illness Son    ADD / ADHD Son    Bipolar disorder Son    Mental illness Son    ADD / ADHD Son    Bipolar disorder Son     Social History   Tobacco Use   Smoking status: Former    Packs/day: 0.20    Years: 32.00    Pack years: 6.40    Types: Cigarettes, Cigars    Start date: 07/05/1983    Quit date: 04/03/2012    Years since quitting: 8.8   Smokeless tobacco: Never  Vaping Use   Vaping Use: Never used  Substance Use Topics   Alcohol use: No    Alcohol/week: 0.0 standard drinks   Drug use: No    Home Medications Prior to Admission medications   Medication Sig Start Date End Date Taking? Authorizing Provider  ALPRAZolam Duanne Moron) 0.5 MG tablet Take 1 tablet (0.5 mg total) by mouth 2 (two) times daily as needed for anxiety. 09/02/20   Gosrani, Nimish C, MD  amLODipine (NORVASC) 10 MG tablet TAKE 1 TABLET ONCE DAILY. 02/03/21   Thompson Grayer, MD  aspirin EC 81  MG tablet Take 1 tablet (81 mg total) by mouth daily. 07/16/20   Strader, Fransisco Hertz, PA-C  blood glucose meter kit and supplies KIT Use to check blood sugar daily. 11/12/20   Ailene Ards, NP  escitalopram (LEXAPRO) 20 MG tablet Take 1 tablet  (20 mg total) by mouth daily. 10/21/20   [name], MD  fluorometholone (FML) 0.1 % ophthalmic suspension SMARTSIG:In Eye(s) 08/21/20   [provider]  fluticasone-salmeterol (ADVAIR) 100-50 MCG/ACT AEPB INHALE 1 PUFF BY MOUTH TWICE DAILY 08/25/20   Hurshel Party C, MD  isosorbide mononitrate (IMDUR) 30 MG 24 hr tablet Take 90 mg by mouth daily. 09/09/20   [provider]  LINZESS 145 MCG CAPS capsule Take 1 capsule (145 mcg total) by mouth daily. 10/21/20   [name], MD  losartan (COZAAR) 100 MG tablet TAKE 1 TABLET BY MOUTH ONCE DAILY 12/09/20   Ahmed Prima, [place] M, PA-C  methylPREDNISolone (MEDROL DOSEPAK) 4 MG TBPK tablet As directed for 6 days. Patient not taking: No sig reported 08/19/20   Hilts, Michael, MD  metoprolol tartrate (LOPRESSOR) 25 MG tablet Take 1 tablet (25 mg total) by mouth 2 (two) times daily. 12/09/20   Satira Sark, MD  metroNIDAZOLE (FLAGYL) 500 MG tablet Take 1 tablet (500 mg total) by mouth 2 (two) times daily. 01/16/21   Estill Dooms, NP  nitroGLYCERIN (NITROSTAT) 0.4 MG SL tablet DISSOLVE 1 TABLET UNDER THE TONGUE AS NEEDED FOR CHEST PAIN EVERY 5 MINUTES UP TO 3 TIMES. IF NO RELIEF CALL 911. 09/05/20   Satira Sark, MD  Vibegron (GEMTESA) 75 MG TABS Take 75 mg by mouth daily. 01/30/21   Stoneking, Reece Leader., MD    Allergies    Bee venom, Lipitor [atorvastatin], Penicillins, Shellfish allergy, Tomato, and Zofran [ondansetron]  Review of Systems   Review of Systems  Constitutional:  Negative for chills, fatigue and fever.  HENT:  Negative for trouble swallowing.   Eyes:  Negative for visual disturbance.  Respiratory:  Negative for cough, shortness of breath and wheezing.   Cardiovascular:  Positive for chest pain. Negative for palpitations.  Gastrointestinal:  Negative for abdominal pain, nausea and vomiting.  Genitourinary:  Negative for dysuria.  Musculoskeletal:  Negative for arthralgias (left shoulder pain),  back pain, myalgias, neck pain and neck stiffness.  Skin:  Negative for rash.  Neurological:  Positive for numbness. Negative for dizziness, seizures, syncope, facial asymmetry, weakness and headaches.       Numbness and tingling of left arm  Hematological:  Does not bruise/bleed easily.  Psychiatric/Behavioral:  Negative for confusion.   All other systems reviewed and are negative.  Physical Exam Updated Vital Signs BP (!) 176/95 (BP Location: Left Arm)   Pulse 68   Temp 98.4 F (36.9 C) (Oral)   Resp 14   Ht $R'5\' 6"'hD$  (1.676 m)   Wt 73 kg   SpO2 100%   BMI 25.99 kg/m   Physical Exam Vitals and nursing note reviewed.  Constitutional:      General: She is not in acute distress.    Appearance: Normal appearance. She is not toxic-appearing or diaphoretic.  HENT:     Head: Normocephalic.     Mouth/Throat:     Mouth: Mucous membranes are moist.     Pharynx: Oropharynx is clear.  Eyes:     General: No visual field deficit.    Extraocular Movements: Extraocular movements intact.     Conjunctiva/sclera: Conjunctivae normal.  Pupils: Pupils are equal, round, and reactive to light.  Neck:     Trachea: Phonation normal.  Cardiovascular:     Rate and Rhythm: Normal rate and regular rhythm.     Pulses: Normal pulses.  Pulmonary:     Effort: Pulmonary effort is normal. No respiratory distress.     Breath sounds: Normal breath sounds. No wheezing.  Abdominal:     Palpations: Abdomen is soft.     Tenderness: There is no abdominal tenderness.  Musculoskeletal:        General: Normal range of motion.     Cervical back: Normal range of motion.     Right lower leg: No edema.     Left lower leg: No edema.  Skin:    General: Skin is warm and dry.     Capillary Refill: Capillary refill takes less than 2 seconds.     Findings: No rash.  Neurological:     General: No focal deficit present.     Mental Status: She is alert and oriented to person, place, and time.     GCS: GCS eye  subscore is 4. GCS verbal subscore is 5. GCS motor subscore is 6.     Cranial Nerves: No dysarthria or facial asymmetry.     Sensory: Sensation is intact.     Motor: Motor function is intact.     Coordination: Coordination normal. Heel to Shin Test normal.     Gait: Gait normal.     Comments: Slight decreased grip strength on the left compared to right.  Speech clear, mentating well.  No facial droop noted.  No pronator drift.  Normal finger-nose testing.    ED Results / Procedures / Treatments   Labs (all labs ordered are listed, but only abnormal results are displayed) Labs Reviewed  CBG MONITORING, ED - Abnormal; Notable for the following components:      Result Value   Glucose-Capillary 101 (*)    All other components within normal limits  BASIC METABOLIC PANEL  CBC  PROTIME-INR  TROPONIN I (HIGH SENSITIVITY)  TROPONIN I (HIGH SENSITIVITY)    EKG EKG Interpretation  Date/Time:  Tuesday February 17 2021 10:22:41 EST Ventricular Rate:  70 PR Interval:  166 QRS Duration: 105 QT Interval:  427 QTC Calculation: 461 R Axis:   23 Text Interpretation: Sinus rhythm Probable left ventricular hypertrophy similar to July 2022 Confirmed by Sherwood Gambler [phone]) on 02/17/2021 10:31:52 AM  Radiology MR BRAIN WO CONTRAST  Result Date: 02/17/2021 CLINICAL DATA:  Neuro deficit, acute, stroke suspected EXAM: MRI HEAD WITHOUT CONTRAST TECHNIQUE: Multiplanar, multiecho pulse sequences of the brain and surrounding structures were obtained without intravenous contrast. COMPARISON:  2017 FINDINGS: Brain: There is no acute infarction or intracranial hemorrhage. There is no intracranial mass, mass effect, or edema. There is no hydrocephalus or extra-axial fluid collection. Ventricles and sulci are normal in size and configuration. Minimal punctate foci of T2 hyperintensity in the supratentorial white matter are nonspecific but could reflect minor chronic microvascular ischemic changes. Vascular:  Major vessel flow voids at the skull base are preserved. Skull and upper cervical spine: Normal marrow signal is preserved. Sinuses/Orbits: Paranasal sinuses are aerated. Orbits are unremarkable. Other: Sella is unremarkable. Mastoid air cells are clear. Retention cyst posterior nasopharynx. IMPRESSION: No evidence of recent infarction, hemorrhage, or mass. Electronically Signed   By: Macy Mis M.D.   On: 02/17/2021 13:19   DG Chest Portable 1 View  Result Date: 02/17/2021 CLINICAL DATA:  Chest pain  EXAM: PORTABLE CHEST 1 VIEW COMPARISON:  10/22/2020 FINDINGS: Cardiomegaly. Both lungs are clear. The visualized skeletal structures are unremarkable. IMPRESSION: Cardiomegaly without acute abnormality of the lungs in [facility] portable projection. Electronically Signed   By: Delanna Ahmadi M.D.   On: 02/17/2021 11:16    Procedures Procedures   Medications Ordered in ED Medications - No data to display  ED Course  I have reviewed the triage vital signs and the nursing notes.  Pertinent labs & imaging results that were available during my care of the patient were reviewed by me and considered in my medical decision making (see chart for details).    MDM Rules/Calculators/A&P                           Patient here under advisement of PCP for evaluation of chest pain.  She has prior history significant for coronary artery disease, hyperlipidemia and reports stroke in 2019.  Takes aspirin and clopidogrel daily.  Chest pain improved after nitroglycerin and aspirin  HEART score 5  On review of medical record, patient was hospitalized at [facility] with symptoms concerning for stroke in 2019.  No acute intracranial abnormality was seen on CT head imaging, patient did receive IV tPA at that time.  Her subsequent MRI brain showed no acute stroke.  She was diagnosed with possible TIA  On my exam the patient, she also notes having numbness of her left arm and hand with vaguely described left facial tingling  that is "about to happen" and stuttering beginning at 8 PM last evening.  She states these symptoms have been present since 2019 with what she described as a stroke. She has slightly decreased grip strength on the left on my exam.  There is no pronator drift, facial weakness, visual deficit or dysarthria and she has normal finger-nose testing.  Cardiac catheterization from July 2022 shows occlusion of right coronary within a previous placed coronary stent.  The right coronary has collaterals from left coronary to right coronary artery.  Left main widely patent.  Proximal LAD showed 50% in-stent diffuse restenosis  EKG without acute ischemic change.  Delta troponin reassuring.  No electrolyte derangement.  No leukocytosis.  Chest x-ray without acute findings.  Doubt acute cardiac process.  Given patient's neurologic symptoms, will proceed with MRI brain  Pt also seen by Dr. Regenia Skeeter and care plan discussed.   Labs w/o significant findings,  delta trop unremarkable. MRI brain w/o evidence of recent infarction, hemorrhage or mass Pt has ambulated in the dept to and from restroom w/o difficulty or assistance, gait steady.  Cause of pt's sx's unclear at this time.  She has close f/u with cards, will recommend neurology f/u as well.  Strict return precautions given.   Final Clinical Impression(s) / ED Diagnoses Final diagnoses:  Atypical chest pain  Paresthesias    Rx / DC Orders ED Discharge Orders     None        Kem Parkinson, PA-C 02/20/21 1036    Sherwood Gambler, MD 02/20/21 1348

## 2021-02-17 NOTE — Discharge Instructions (Signed)
Your work-up today was reassuring.  As discussed, it is important that you keep your upcoming appointment with your cardiologist for further evaluation.  Return to the emergency department for any new or worsening symptoms.

## 2021-02-17 NOTE — ED Triage Notes (Signed)
Pt c/o chest pain and increased heart rate x 2 days. Pt took her Nitro and ASA with no relief in pain. Pt reports she had left arm numbness that started yesterday and left eye drooping that started this morning while drinking her coffee.

## 2021-02-17 NOTE — ED Notes (Signed)
Pt to MRI

## 2021-02-17 NOTE — ED Notes (Signed)
PA at bedside doing assessment

## 2021-02-27 ENCOUNTER — Ambulatory Visit: Payer: Medicare Other | Admitting: Urology

## 2021-02-27 NOTE — Progress Notes (Deleted)
Assessment: 1. Urinary frequency   2. Urge incontinence     Plan: I discussed possible causes of urinary frequency.  I recommended that she avoid dietary irritants including caffeine.  I also discussed the possible association of her urinary frequency with her significant fluid intake. Trial of Gemtesa 75 mg daily.  Samples given. Voiding diary Return to office in 4 weeks  Chief Complaint:  No chief complaint on file.   History of Present Illness:  Bethany Wang is a 55 y.o. year old female who is seen for further evaluation of urinary frequency.  At her initial visit on 01/30/21, she reported symptoms of urinary frequency for approximately 8 months.  Her symptoms began after passage of a ureteral calculus.  She was diagnosed with a 3 mm left mid ureteral calculus in 12/21.  She was scheduled to undergo lithotripsy but apparently passed the stone spontaneously.  A repeat CT scan from 2/22 was negative for stones.  Since passage of the stone, she reported significant urinary frequency voiding 10-15 times per day and nocturia 4-5 times per night.  She also has had urge incontinence.  She reports significant fluid intake throughout the day, drinking 2 pots of decaffeinated coffee in the morning and at least 1-1/2 gallons of water per day.  She uses always pads for management of her incontinence.  No dysuria or gross hematuria.  No recent UTIs.  She was given a trial of Gemtesa 75 mg daily in 10/22.  Portions of the above documentation were copied from a prior visit for review purposes only.   Past Medical History:  Past Medical History:  Diagnosis Date   Anemia    Anxiety    Bilateral breast cysts 12/30/2015   Brain tumor ([place])    Breast cancer ([place])    Left Breast Cancer   Breast disorder    BV (bacterial vaginosis) 09/09/2015   Coronary atherosclerosis of native coronary artery    a. s/p DES to RCA in 2013 b. DES to LAD in 2014 c. cath in 04/2016 showing patent LAD stent  with D2 jailed by LAD stent and CTO of RCA with left to right collaterals present   Cyst of pharynx or nasopharynx    [name]   Decreased libido 12/25/2018   Depression    Diabetes mellitus without complication ([place])    Essential hypertension    Falls    x 3-4 in past year   GERD (gastroesophageal reflux disease)    Headache    Heart attack ([place])    x4   History of hematuria    Mixed hyperlipidemia    Personal history of chemotherapy    Left Breast Cancer   Personal history of radiation therapy    Left Breast Cancer   Pre-diabetes    PUD (peptic ulcer disease)    Seizures ([place])    Shingles 09/09/2015   Stroke ([place])    12-2017 on Plavix, only deficit is headaches   Suicide attempt ([place])    3 attempts in remote past   Trichimoniasis 08/20/2019   Treated 08/20/19    Uterine cancer ([place])    Vitamin D deficiency disease 04/03/2019    Past Surgical History:  Past Surgical History:  Procedure Laterality Date   ABDOMINAL HYSTERECTOMY     BIOPSY  11/28/2019   Procedure: BIOPSY;  Surgeon: Rogene Houston, MD;  [facility];  Service: Endoscopy;;  antral, gastric body    BLADDER SURGERY  BREAST LUMPECTOMY Left    BREAST LUMPECTOMY Right 02/24/2018   Procedure: RIGHT BREAST LUMPECTOMY ERAS PATHWAY;  Surgeon: Jovita Kussmaul, MD;  Location: [facility];  Service: General;  Laterality: Right;   CARDIAC CATHETERIZATION N/A 05/10/2016   Procedure: Left Heart Cath and Coronary Angiography;  Surgeon: Belva Crome, MD;  Location: [facility];  Service: Cardiovascular;  Laterality: N/A;   CAUTERIZE INNER NOSE  07/13/2020   COLONOSCOPY  May 2010   Fleishman: normal rectum, internal hemorrhoids, , benign colonic polyp   COLONOSCOPY N/A 01/05/2017   Procedure: COLONOSCOPY;  Surgeon: Rogene Houston, MD;  [facility];  Service: Endoscopy;  Laterality: N/A;  1:00   ESOPHAGEAL DILATION  11/28/2019   Procedure: ESOPHAGEAL DILATION;  Surgeon:  Rogene Houston, MD;  [facility];  Service: Endoscopy;;   ESOPHAGOGASTRODUODENOSCOPY     2001 Dr. Amedeo Plenty: distal esophagitis, small hiatal hernia,. Dr. Tamala Julian 2006? no records available currently, pt also reports  EGD a few years ago with Dr. Gala Romney, do not have these reports anywhere in medical records   ESOPHAGOGASTRODUODENOSCOPY  06/02/2011   DUK:GURKYH pill impaction as described above s/p dilation of a probable cervical esophageal web/bx abnormal esophageal and gastric mucosa. + H.pylori gastritis    ESOPHAGOGASTRODUODENOSCOPY (EGD) WITH PROPOFOL N/A 11/28/2019   Procedure: ESOPHAGOGASTRODUODENOSCOPY (EGD) WITH PROPOFOL;  Surgeon: Rogene Houston, MD;  [facility];  Service: Endoscopy;  Laterality: N/A;  1020   EYE SURGERY     Removed glass   HAND SURGERY Right    INTRAVASCULAR PRESSURE WIRE/FFR STUDY N/A 10/23/2020   Procedure: INTRAVASCULAR PRESSURE WIRE/FFR STUDY;  Surgeon: Belva Crome, MD;  Location: [facility];  Service: Cardiovascular;  Laterality: N/A;   Left breast lumpectomy     Benign   LEFT HEART CATH AND CORONARY ANGIOGRAPHY N/A 10/25/2018   Procedure: LEFT HEART CATH AND CORONARY ANGIOGRAPHY;  Surgeon: Wellington Hampshire, MD;  Location: [facility];  Service: Cardiovascular;  Laterality: N/A;   LEFT HEART CATH AND CORONARY ANGIOGRAPHY N/A 10/23/2020   Procedure: LEFT HEART CATH AND CORONARY ANGIOGRAPHY;  Surgeon: Belva Crome, MD;  Location: [place] [facility];  Service: Cardiovascular;  Laterality: N/A;   LEFT HEART CATHETERIZATION WITH CORONARY ANGIOGRAM N/A 07/03/2014   Procedure: LEFT HEART CATHETERIZATION WITH CORONARY ANGIOGRAM;  Surgeon: Belva Crome, MD;  Location: [facility];  Service: Cardiovascular;  Laterality: N/A;   POLYPECTOMY  01/05/2017   Procedure: POLYPECTOMY;  Surgeon: Rogene Houston, MD;  [facility];  Service: Endoscopy;;  colon   SHOULDER SURGERY Left     Allergies:  Allergies  Allergen Reactions    Bee Venom Anaphylaxis and Other (See Comments)    "throat swelled and had to the hospital" - Yellow jacket sting   Lipitor [Atorvastatin] Rash and Other (See Comments)    Liver damage & Upper Abdominal pain, Break out like a rash . Per last hosp visit. It exposed on inpatient stay.   Penicillins Anaphylaxis and Other (See Comments)    Has patient had a PCN reaction causing immediate rash, facial/tongue/throat swelling, SOB or lightheadedness with hypotension: Yes Has patient had a PCN reaction causing severe rash involving mucus membranes or skin necrosis: Yes Has patient had a PCN reaction that required hospitalization Yes Has patient had a PCN reaction occurring within the last 10 years: Yes If all of the above answers are "NO", then may proceed with Cephalosporin use. Patient  states she carries an epi-pen   Shellfish Allergy Swelling   Tomato Itching and Swelling   Zofran [Ondansetron] Itching    Family History:  Family History  Problem Relation Age of Onset   Colon cancer Paternal Grandfather    Cancer Father    Cancer Maternal Uncle    Alzheimer's disease Paternal Aunt    Cirrhosis Maternal Uncle    Cancer Paternal Aunt        lung   Aneurysm Mother        brain   Heart disease Brother    Heart attack Brother    Mental illness Daughter    ADD / ADHD Daughter    Bipolar disorder Daughter    Mental illness Son    ADD / ADHD Son    Bipolar disorder Son    Mental illness Son    ADD / ADHD Son    Bipolar disorder Son     Social History:  Social History   Tobacco Use   Smoking status: Former    Packs/day: 0.20    Years: 32.00    Pack years: 6.40    Types: Cigarettes, Cigars    Start date: 07/05/1983    Quit date: 04/03/2012    Years since quitting: 8.9   Smokeless tobacco: Never  Vaping Use   Vaping Use: Never used  Substance Use Topics   Alcohol use: No    Alcohol/week: 0.0 standard drinks   Drug use: No    ROS: Constitutional:  Negative for fever,  chills, weight loss CV: Negative for chest pain, previous MI, hypertension Respiratory:  Negative for shortness of breath, wheezing, sleep apnea, frequent cough GI:  Negative for nausea, vomiting, bloody stool, GERD  Physical exam: There were no vitals taken for this visit. ***  Results: ***

## 2021-03-02 DIAGNOSIS — Z1389 Encounter for screening for other disorder: Secondary | ICD-10-CM | POA: Diagnosis not present

## 2021-03-02 DIAGNOSIS — T466X5A Adverse effect of antihyperlipidemic and antiarteriosclerotic drugs, initial encounter: Secondary | ICD-10-CM | POA: Diagnosis not present

## 2021-03-02 DIAGNOSIS — I2510 Atherosclerotic heart disease of native coronary artery without angina pectoris: Secondary | ICD-10-CM | POA: Diagnosis not present

## 2021-03-02 DIAGNOSIS — G720 Drug-induced myopathy: Secondary | ICD-10-CM | POA: Diagnosis not present

## 2021-03-02 DIAGNOSIS — E785 Hyperlipidemia, unspecified: Secondary | ICD-10-CM | POA: Diagnosis not present

## 2021-03-02 DIAGNOSIS — I10 Essential (primary) hypertension: Secondary | ICD-10-CM | POA: Diagnosis not present

## 2021-03-02 DIAGNOSIS — E039 Hypothyroidism, unspecified: Secondary | ICD-10-CM | POA: Diagnosis not present

## 2021-03-10 ENCOUNTER — Other Ambulatory Visit (HOSPITAL_COMMUNITY): Payer: Self-pay | Admitting: Internal Medicine

## 2021-03-10 ENCOUNTER — Ambulatory Visit (INDEPENDENT_AMBULATORY_CARE_PROVIDER_SITE_OTHER): Payer: Medicare Other | Admitting: Urology

## 2021-03-10 ENCOUNTER — Encounter: Payer: Self-pay | Admitting: Urology

## 2021-03-10 ENCOUNTER — Other Ambulatory Visit: Payer: Self-pay

## 2021-03-10 VITALS — BP 161/90 | HR 76 | Wt 161.0 lb

## 2021-03-10 DIAGNOSIS — R350 Frequency of micturition: Secondary | ICD-10-CM

## 2021-03-10 DIAGNOSIS — Z1231 Encounter for screening mammogram for malignant neoplasm of breast: Secondary | ICD-10-CM

## 2021-03-10 DIAGNOSIS — R638 Other symptoms and signs concerning food and fluid intake: Secondary | ICD-10-CM

## 2021-03-10 LAB — URINALYSIS, ROUTINE W REFLEX MICROSCOPIC
Bilirubin, UA: NEGATIVE
Glucose, UA: NEGATIVE
Ketones, UA: NEGATIVE
Leukocytes,UA: NEGATIVE
Nitrite, UA: NEGATIVE
Protein,UA: NEGATIVE
Specific Gravity, UA: 1.01 (ref 1.005–1.030)
Urobilinogen, Ur: 0.2 mg/dL (ref 0.2–1.0)
pH, UA: 6 (ref 5.0–7.5)

## 2021-03-10 LAB — MICROSCOPIC EXAMINATION: Renal Epithel, UA: NONE SEEN /hpf

## 2021-03-10 NOTE — Progress Notes (Signed)
Assessment: 1. Urinary frequency   2. Excessive fluid intake     Plan: I reviewed the patient's voiding diary.  This shows significant fluid intake throughout the day which is the likely cause of her urinary frequency.  Given this fluid intake, I do not think that any therapy for her bladder will improve her symptoms.  I recommended that she attempt to decrease her fluid intake as this will likely decrease her frequency. Return to office in 2 months.  Chief Complaint:  Chief Complaint  Patient presents with   Urinary Frequency     History of Present Illness:  Bethany Wang is a 55 y.o. year old female who is seen for further evaluation of urinary frequency.  At her initial visit on 01/30/21, she reported symptoms of urinary frequency for approximately 8 months.  Her symptoms began after passage of a ureteral calculus.  She was diagnosed with a 3 mm left mid ureteral calculus in 12/21.  She was scheduled to undergo lithotripsy but apparently passed the stone spontaneously.  A repeat CT scan from 2/22 was negative for stones.  Since passage of the stone, she reported significant urinary frequency voiding 10-15 times per day and nocturia 4-5 times per night.  She also has had urge incontinence.  She reports significant fluid intake throughout the day, drinking 2 pots of decaffeinated coffee in the morning and at least 1-1/2 gallons of water per day.  She uses always pads for management of her incontinence.  No dysuria or gross hematuria.  No recent UTIs.  She was given a trial of Gemtesa 75 mg daily in 10/22.   She returns today for follow-up.  She noted some slight improvement in her frequency with the [facility]. She continues to have significant fluid intake throughout the day.  She has recently been started on a diuretic. Voiding diary results: Day 1 15.33 L intake/12.7 L output Day 2 12.3 L intake/11.2 L output Day 3 10.9 L intake/7.44 L ouput No dysuria or gross  hematuria.  Portions of the above documentation were copied from a prior visit for review purposes only.   Past Medical History:  Past Medical History:  Diagnosis Date   Anemia    Anxiety    Bilateral breast cysts 12/30/2015   Brain tumor ([place])    Breast cancer ([place])    Left Breast Cancer   Breast disorder    BV (bacterial vaginosis) 09/09/2015   Coronary atherosclerosis of native coronary artery    a. s/p DES to RCA in 2013 b. DES to LAD in 2014 c. cath in 04/2016 showing patent LAD stent with D2 jailed by LAD stent and CTO of RCA with left to right collaterals present   Cyst of pharynx or nasopharynx    [name]   Decreased libido 12/25/2018   Depression    Diabetes mellitus without complication ([place])    Essential hypertension    Falls    x 3-4 in past year   GERD (gastroesophageal reflux disease)    Headache    Heart attack ([place])    x4   History of hematuria    Mixed hyperlipidemia    Personal history of chemotherapy    Left Breast Cancer   Personal history of radiation therapy    Left Breast Cancer   Pre-diabetes    PUD (peptic ulcer disease)    Seizures ([place])    Shingles 09/09/2015   Stroke ([place])    12-2017 on Plavix, only deficit is  headaches   Suicide attempt ([place])    3 attempts in remote past   Trichimoniasis 08/20/2019   Treated 08/20/19    Uterine cancer ([place])    Vitamin D deficiency disease 04/03/2019    Past Surgical History:  Past Surgical History:  Procedure Laterality Date   ABDOMINAL HYSTERECTOMY     BIOPSY  11/28/2019   Procedure: BIOPSY;  Surgeon: Rogene Houston, MD;  [facility];  Service: Endoscopy;;  antral, gastric body    BLADDER SURGERY     BREAST LUMPECTOMY Left    BREAST LUMPECTOMY Right 02/24/2018   Procedure: RIGHT BREAST LUMPECTOMY ERAS PATHWAY;  Surgeon: Jovita Kussmaul, MD;  Location: [facility];  Service: General;  Laterality: Right;   CARDIAC CATHETERIZATION N/A 05/10/2016   Procedure: Left Heart  Cath and Coronary Angiography;  Surgeon: Belva Crome, MD;  Location: [place];  Service: Cardiovascular;  Laterality: N/A;   CAUTERIZE INNER NOSE  07/13/2020   COLONOSCOPY  May 2010   Fleishman: normal rectum, internal hemorrhoids, , benign colonic polyp   COLONOSCOPY N/A 01/05/2017   Procedure: COLONOSCOPY;  Surgeon: Rogene Houston, MD;  [facility];  Service: Endoscopy;  Laterality: N/A;  1:00   ESOPHAGEAL DILATION  11/28/2019   Procedure: ESOPHAGEAL DILATION;  Surgeon: Rogene Houston, MD;  [facility];  Service: Endoscopy;;   ESOPHAGOGASTRODUODENOSCOPY     2001 Dr. Amedeo Plenty: distal esophagitis, small hiatal hernia,. Dr. Tamala Julian 2006? no records available currently, pt also reports  EGD a few years ago with Dr. Gala Romney, do not have these reports anywhere in medical records   ESOPHAGOGASTRODUODENOSCOPY  06/02/2011   MIW:OEHOZY pill impaction as described above s/p dilation of a probable cervical esophageal web/bx abnormal esophageal and gastric mucosa. + H.pylori gastritis    ESOPHAGOGASTRODUODENOSCOPY (EGD) WITH PROPOFOL N/A 11/28/2019   Procedure: ESOPHAGOGASTRODUODENOSCOPY (EGD) WITH PROPOFOL;  Surgeon: Rogene Houston, MD;  [facility];  Service: Endoscopy;  Laterality: N/A;  1020   EYE SURGERY     Removed glass   HAND SURGERY Right    INTRAVASCULAR PRESSURE WIRE/FFR STUDY N/A 10/23/2020   Procedure: INTRAVASCULAR PRESSURE WIRE/FFR STUDY;  Surgeon: Belva Crome, MD;  Location: [place] [facility];  Service: Cardiovascular;  Laterality: N/A;   Left breast lumpectomy     Benign   LEFT HEART CATH AND CORONARY ANGIOGRAPHY N/A 10/25/2018   Procedure: LEFT HEART CATH AND CORONARY ANGIOGRAPHY;  Surgeon: Wellington Hampshire, MD;  Location: [place] [facility];  Service: Cardiovascular;  Laterality: N/A;   LEFT HEART CATH AND CORONARY ANGIOGRAPHY N/A 10/23/2020   Procedure: LEFT HEART CATH AND CORONARY ANGIOGRAPHY;  Surgeon: Belva Crome, MD;  Location: [place] [facility];  Service: Cardiovascular;  Laterality: N/A;   LEFT HEART CATHETERIZATION WITH CORONARY ANGIOGRAM N/A 07/03/2014   Procedure: LEFT HEART CATHETERIZATION WITH CORONARY ANGIOGRAM;  Surgeon: Belva Crome, MD;  Location: [facility];  Service: Cardiovascular;  Laterality: N/A;   POLYPECTOMY  01/05/2017   Procedure: POLYPECTOMY;  Surgeon: Rogene Houston, MD;  [facility];  Service: Endoscopy;;  colon   SHOULDER SURGERY Left     Allergies:  Allergies  Allergen Reactions   Bee Venom Anaphylaxis and Other (See Comments)    "throat swelled and had to the hospital" - Yellow jacket sting   Lipitor [Atorvastatin] Rash and Other (See Comments)    Liver damage & Upper Abdominal pain, Break out like a rash .  Per last hosp visit. It exposed on inpatient stay.   Penicillins Anaphylaxis and Other (See Comments)    Has patient had a PCN reaction causing immediate rash, facial/tongue/throat swelling, SOB or lightheadedness with hypotension: Yes Has patient had a PCN reaction causing severe rash involving mucus membranes or skin necrosis: Yes Has patient had a PCN reaction that required hospitalization Yes Has patient had a PCN reaction occurring within the last 10 years: Yes If all of the above answers are "NO", then may proceed with Cephalosporin use. Patient states she carries an epi-pen   Shellfish Allergy Swelling   Tomato Itching and Swelling   Zofran [Ondansetron] Itching    Family History:  Family History  Problem Relation Age of Onset   Colon cancer Paternal Grandfather    Cancer Father    Cancer Maternal Uncle    Alzheimer's disease Paternal Aunt    Cirrhosis Maternal Uncle    Cancer Paternal Aunt        lung   Aneurysm Mother        brain   Heart disease Brother    Heart attack Brother    Mental illness Daughter    ADD / ADHD Daughter    Bipolar disorder Daughter    Mental illness Son    ADD / ADHD Son    Bipolar disorder Son    Mental illness Son     ADD / ADHD Son    Bipolar disorder Son     Social History:  Social History   Tobacco Use   Smoking status: Former    Packs/day: 0.20    Years: 32.00    Pack years: 6.40    Types: Cigarettes, Cigars    Start date: 07/05/1983    Quit date: 04/03/2012    Years since quitting: 8.9   Smokeless tobacco: Never  Vaping Use   Vaping Use: Never used  Substance Use Topics   Alcohol use: No    Alcohol/week: 0.0 standard drinks   Drug use: No    ROS: Constitutional:  Negative for fever, chills, weight loss CV: Negative for chest pain, previous MI, hypertension Respiratory:  Negative for shortness of breath, wheezing, sleep apnea, frequent cough GI:  Negative for nausea, vomiting, bloody stool, GERD  Physical exam: BP (!) 161/90   Pulse 76   Wt 161 lb (73 kg)   BMI 25.99 kg/m  GENERAL APPEARANCE:  Well appearing, well developed, well nourished, NAD HEENT:  Atraumatic, normocephalic, oropharynx clear NECK:  Supple without lymphadenopathy or thyromegaly ABDOMEN:  Soft, non-tender, no masses EXTREMITIES:  Moves all extremities well, without clubbing, cyanosis, or edema NEUROLOGIC:  Alert and oriented x 3, normal gait, CN II-XII grossly intact MENTAL STATUS:  appropriate BACK:  Non-tender to palpation, No CVAT SKIN:  Warm, dry, and intact  Results: Results for orders placed or performed in visit on 03/10/21 (from the past 24 hour(s))  Urinalysis, Routine w reflex microscopic     Status: Abnormal   Collection Time: 03/10/21 12:09 PM  Result Value Ref Range   Specific Gravity, UA 1.010 1.005 - 1.030   pH, UA 6.0 5.0 - 7.5   Color, UA Yellow Yellow   Appearance Ur Clear Clear   Leukocytes,UA Negative Negative   Protein,UA Negative Negative/Trace   Glucose, UA Negative Negative   Ketones, UA Negative Negative   RBC, UA Trace (A) Negative   Bilirubin, UA Negative Negative   Urobilinogen, Ur 0.2 0.2 - 1.0 mg/dL   Nitrite, UA Negative Negative  Microscopic Examination See  below:    Narrative   Performed at:  [facility] [address], [place], [place]  [id number] Lab Director: Mina Marble [place], Phone:  [phone]  Microscopic Examination     Status: None   Collection Time: 03/10/21 12:09 PM   Urine  Result Value Ref Range   WBC, UA 0-5 0 - 5 /hpf   RBC 0-2 0 - 2 /hpf   Epithelial Cells (non renal) 0-10 0 - 10 /hpf   Renal Epithel, UA None seen None seen /hpf   Bacteria, UA Few None seen/Few   Narrative   Performed at:  [facility] [address], [place], [place]  [id number] Lab Director: [place], Phone:  [phone]

## 2021-03-10 NOTE — Progress Notes (Signed)
Urological Symptom Review  Patient is experiencing the following symptoms: Frequent urination Hard to postpone urination Get up at night to urinate Leakage of urine   Review of Systems  Gastrointestinal (upper)  : Nausea Vomiting  Gastrointestinal (lower) : Negative for lower GI symptoms  Constitutional : Night Sweats Weight loss Fatigue  Skin: Negative for skin symptoms  Eyes: Blurred vision  Ear/Nose/Throat : Negative for Ear/Nose/Throat symptoms  Hematologic/Lymphatic: Easy bruising  Cardiovascular : Leg swelling Chest pain  Respiratory : Shortness of breath  Endocrine: Excessive thirst  Musculoskeletal: Back pain Joint pain  Neurological: Negative for neurological symptoms  Psychologic: Depression Anxiety

## 2021-03-11 ENCOUNTER — Encounter: Payer: Self-pay | Admitting: Urology

## 2021-03-26 ENCOUNTER — Ambulatory Visit (INDEPENDENT_AMBULATORY_CARE_PROVIDER_SITE_OTHER): Payer: Medicare Other | Admitting: Internal Medicine

## 2021-03-26 ENCOUNTER — Other Ambulatory Visit: Payer: Self-pay

## 2021-03-26 ENCOUNTER — Other Ambulatory Visit: Payer: Self-pay | Admitting: Internal Medicine

## 2021-03-26 ENCOUNTER — Encounter (HOSPITAL_BASED_OUTPATIENT_CLINIC_OR_DEPARTMENT_OTHER): Payer: Self-pay | Admitting: Internal Medicine

## 2021-03-26 VITALS — BP 134/82 | HR 73 | Ht 64.5 in | Wt 157.2 lb

## 2021-03-26 DIAGNOSIS — I25119 Atherosclerotic heart disease of native coronary artery with unspecified angina pectoris: Secondary | ICD-10-CM

## 2021-03-26 DIAGNOSIS — Z888 Allergy status to other drugs, medicaments and biological substances status: Secondary | ICD-10-CM | POA: Diagnosis not present

## 2021-03-26 DIAGNOSIS — I2510 Atherosclerotic heart disease of native coronary artery without angina pectoris: Secondary | ICD-10-CM | POA: Diagnosis not present

## 2021-03-26 DIAGNOSIS — E782 Mixed hyperlipidemia: Secondary | ICD-10-CM

## 2021-03-26 NOTE — Progress Notes (Signed)
LIPID CLINIC CONSULT NOTE  Chief Complaint:  Chest pain  Primary Care Physician: Bluefield Nation, MD  Primary Cardiologist:  [name], MD  HPI:  Bethany Wang is a 55 y.o. female who is being seen today for the evaluation of dyslipidemia at the request of Imogene Burn, PA-C. This is a pleasant 55 year old female that I do not recall seeing in the hospital but she remembers seeing me with a recent hospitalization.  She was hospitalized in July with chest pain and underwent left heart catheterization.  This demonstrated occlusion of the right coronary artery with the previously placed coronary stent and left to right collaterals.  There was a 50% in-stent restenosis of the proximal LAD and 90% diagonal ostial narrowing.  There is also 60 to 70% obtuse marginal narrowing and 50% distal circumflex disease.  Medical therapy was recommended.  She was referred today for management of dyslipidemia.  Unfortunately she was intolerant of statins including atorvastatin and rosuvastatin, both of which caused significant side effects.  Subsequently she had been switched by her primary care provider to [place] and has already had a dose of the medication.  It is too early to look for response to this medication however I agree with this therapy.  More concerning, however today is symptoms of progressive chest pain.  She reports that ever since this past summer she has had worsening chest pain despite long-acting nitroglycerin.  She has been using short acting nitroglycerin very frequently and gets some minimal relief however it is short-lived.  EKG today shows normal sinus rhythm with some nonspecific T wave changes.  PMHx:  Past Medical History:  Diagnosis Date   Anemia    Anxiety    Bilateral breast cysts 12/30/2015   Brain tumor ([place])    Breast cancer ([place])    Left Breast Cancer   Breast disorder    BV (bacterial vaginosis) 09/09/2015   Coronary atherosclerosis of native coronary  artery    a. s/p DES to RCA in 2013 b. DES to LAD in 2014 c. cath in 04/2016 showing patent LAD stent with D2 jailed by LAD stent and CTO of RCA with left to right collaterals present   Cyst of pharynx or nasopharynx    [name]   Decreased libido 12/25/2018   Depression    Diabetes mellitus without complication ([place])    Essential hypertension    Falls    x 3-4 in past year   GERD (gastroesophageal reflux disease)    Headache    Heart attack ([place])    x4   History of hematuria    Mixed hyperlipidemia    Personal history of chemotherapy    Left Breast Cancer   Personal history of radiation therapy    Left Breast Cancer   Pre-diabetes    PUD (peptic ulcer disease)    Seizures ([place])    Shingles 09/09/2015   Stroke ([place])    12-2017 on Plavix, only deficit is headaches   Suicide attempt ([place])    3 attempts in remote past   Trichimoniasis 08/20/2019   Treated 08/20/19    Uterine cancer ([place])    Vitamin D deficiency disease 04/03/2019    Past Surgical History:  Procedure Laterality Date   ABDOMINAL HYSTERECTOMY     BIOPSY  11/28/2019   Procedure: BIOPSY;  Surgeon: Rogene Houston, MD;  [facility];  Service: Endoscopy;;  antral, gastric body    BLADDER SURGERY  BREAST LUMPECTOMY Left    BREAST LUMPECTOMY Right 02/24/2018   Procedure: RIGHT BREAST LUMPECTOMY ERAS PATHWAY;  Surgeon: Jovita Kussmaul, MD;  Location: [facility];  Service: General;  Laterality: Right;   CARDIAC CATHETERIZATION N/A 05/10/2016   Procedure: Left Heart Cath and Coronary Angiography;  Surgeon: Belva Crome, MD;  Location: [facility];  Service: Cardiovascular;  Laterality: N/A;   CAUTERIZE INNER NOSE  07/13/2020   COLONOSCOPY  May 2010   [place]: normal rectum, internal hemorrhoids, , benign colonic polyp   COLONOSCOPY N/A 01/05/2017   Procedure: COLONOSCOPY;  Surgeon: Rogene Houston, MD;  [facility];  Service: Endoscopy;  Laterality: N/A;  1:00    ESOPHAGEAL DILATION  11/28/2019   Procedure: ESOPHAGEAL DILATION;  Surgeon: Rogene Houston, MD;  [facility];  Service: Endoscopy;;   ESOPHAGOGASTRODUODENOSCOPY     2001 Dr. Amedeo Plenty: distal esophagitis, small hiatal hernia,. Dr. Tamala Julian 2006? no records available currently, pt also reports  EGD a few years ago with Dr. Gala Romney, do not have these reports anywhere in medical records   ESOPHAGOGASTRODUODENOSCOPY  06/02/2011   JOA:CZYSAY pill impaction as described above s/p dilation of a probable cervical esophageal web/bx abnormal esophageal and gastric mucosa. + H.pylori gastritis    ESOPHAGOGASTRODUODENOSCOPY (EGD) WITH PROPOFOL N/A 11/28/2019   Procedure: ESOPHAGOGASTRODUODENOSCOPY (EGD) WITH PROPOFOL;  Surgeon: Rogene Houston, MD;  [facility];  Service: Endoscopy;  Laterality: N/A;  1020   EYE SURGERY     Removed glass   HAND SURGERY Right    INTRAVASCULAR PRESSURE WIRE/FFR STUDY N/A 10/23/2020   Procedure: INTRAVASCULAR PRESSURE WIRE/FFR STUDY;  Surgeon: Belva Crome, MD;  Location: [facility];  Service: Cardiovascular;  Laterality: N/A;   Left breast lumpectomy     Benign   LEFT HEART CATH AND CORONARY ANGIOGRAPHY N/A 10/25/2018   Procedure: LEFT HEART CATH AND CORONARY ANGIOGRAPHY;  Surgeon: Wellington Hampshire, MD;  Location: [facility];  Service: Cardiovascular;  Laterality: N/A;   LEFT HEART CATH AND CORONARY ANGIOGRAPHY N/A 10/23/2020   Procedure: LEFT HEART CATH AND CORONARY ANGIOGRAPHY;  Surgeon: Belva Crome, MD;  Location: [facility];  Service: Cardiovascular;  Laterality: N/A;   LEFT HEART CATHETERIZATION WITH CORONARY ANGIOGRAM N/A 07/03/2014   Procedure: LEFT HEART CATHETERIZATION WITH CORONARY ANGIOGRAM;  Surgeon: Belva Crome, MD;  Location: [facility];  Service: Cardiovascular;  Laterality: N/A;   POLYPECTOMY  01/05/2017   Procedure: POLYPECTOMY;  Surgeon: Rogene Houston, MD;  [facility];  Service: Endoscopy;;  colon    SHOULDER SURGERY Left     FAMHx:  Family History  Problem Relation Age of Onset   Colon cancer Paternal Grandfather    Cancer Father    Cancer Maternal Uncle    Alzheimer's disease Paternal Aunt    Cirrhosis Maternal Uncle    Cancer Paternal Aunt        lung   Aneurysm Mother        brain   Heart disease Brother    Heart attack Brother    Mental illness Daughter    ADD / ADHD Daughter    Bipolar disorder Daughter    Mental illness Son    ADD / ADHD Son    Bipolar disorder Son    Mental illness Son    ADD / ADHD Son    Bipolar disorder Son     SOCHx:   reports that she  quit smoking about 8 years ago. Her smoking use included cigarettes and cigars. She started smoking about 37 years ago. She has a 6.40 pack-year smoking history. She has never used smokeless tobacco. She reports that she does not drink alcohol and does not use drugs.  ALLERGIES:  Allergies  Allergen Reactions   Bee Venom Anaphylaxis and Other (See Comments)    "throat swelled and had to the hospital" - Yellow jacket sting   Lipitor [Atorvastatin] Rash and Other (See Comments)    Liver damage & Upper Abdominal pain, Break out like a rash . Per last hosp visit. It exposed on inpatient stay.   Penicillins Anaphylaxis and Other (See Comments)    Has patient had a PCN reaction causing immediate rash, facial/tongue/throat swelling, SOB or lightheadedness with hypotension: Yes Has patient had a PCN reaction causing severe rash involving mucus membranes or skin necrosis: Yes Has patient had a PCN reaction that required hospitalization Yes Has patient had a PCN reaction occurring within the last 10 years: Yes If all of the above answers are "NO", then may proceed with Cephalosporin use. Patient states she carries an epi-pen   Shellfish Allergy Swelling   Tomato Itching and Swelling   Zofran [Ondansetron] Itching    ROS: Pertinent items noted in HPI and remainder of comprehensive ROS otherwise  negative.  HOME MEDS: Current Outpatient Medications on File Prior to Visit  Medication Sig Dispense Refill   albuterol (VENTOLIN HFA) 108 (90 Base) MCG/ACT inhaler Inhale into the lungs every 6 (six) hours as needed for wheezing or shortness of breath.     ALPRAZolam (XANAX) 0.5 MG tablet Take 1 tablet (0.5 mg total) by mouth 2 (two) times daily as needed for anxiety. 30 tablet 0   amLODipine (NORVASC) 10 MG tablet TAKE 1 TABLET ONCE DAILY. 90 tablet 3   aspirin EC 81 MG tablet Take 1 tablet (81 mg total) by mouth daily. 180 tablet 1   blood glucose meter kit and supplies KIT Use to check blood sugar daily. 1 each 0   clopidogrel (PLAVIX) 75 MG tablet Take 75 mg by mouth daily.     escitalopram (LEXAPRO) 20 MG tablet Take 1 tablet (20 mg total) by mouth daily. 30 tablet 3   Evolocumab (REPATHA SURECLICK [place]) Inject 140 mg into the skin.     hydrochlorothiazide (HYDRODIURIL) 12.5 MG tablet Take 12.5 mg by mouth daily.     LINZESS 145 MCG CAPS capsule Take 1 capsule (145 mcg total) by mouth daily. 30 capsule 3   metoprolol tartrate (LOPRESSOR) 25 MG tablet Take 1 tablet (25 mg total) by mouth 2 (two) times daily. 180 tablet 3   nitroGLYCERIN (NITROSTAT) 0.4 MG SL tablet DISSOLVE 1 TABLET UNDER THE TONGUE AS NEEDED FOR CHEST PAIN EVERY 5 MINUTES UP TO 3 TIMES. IF NO RELIEF CALL 911. 25 tablet 1   thyroid (ARMOUR) 60 MG tablet Take 60 mg by mouth daily before breakfast.     traMADol (ULTRAM) 50 MG tablet Take by mouth every 6 (six) hours as needed.     fluticasone-salmeterol (ADVAIR) 100-50 MCG/ACT AEPB INHALE 1 PUFF BY MOUTH TWICE DAILY 60 each 10   isosorbide mononitrate (IMDUR) 30 MG 24 hr tablet Take 90 mg by mouth daily.     Current Facility-Administered Medications on File Prior to Visit  Medication Dose Route Frequency Provider Last Rate Last Admin   NONFORMULARY OR COMPOUNDED ITEM 0.2 mL  0.2 mL Intramuscular Q7 days Wilson Singer, MD  0.2 mL at 10/23/19 1002    LABS/IMAGING: No  results found for this or any previous visit (from the past 48 hour(s)). No results found.  LIPID PANEL:    Component Value Date/Time   CHOL 216 (H) 12/29/2020 1518   TRIG 86 12/29/2020 1518   HDL 50 12/29/2020 1518   CHOLHDL 4.3 12/29/2020 1518   VLDL 17 12/29/2020 1518   LDLCALC 149 (H) 12/29/2020 1518   LDLCALC 146 (H) 07/03/2020 1334    WEIGHTS: Wt Readings from Last 3 Encounters:  03/26/21 157 lb 3.2 oz (71.3 kg)  03/10/21 161 lb (73 kg)  02/17/21 161 lb (73 kg)    VITALS: BP 134/82    Pulse 73    Ht 5' 4.5" (1.638 m)    Wt 157 lb 3.2 oz (71.3 kg)    SpO2 95%    BMI 26.57 kg/m   EXAM: General appearance: alert and no distress Neck: no carotid bruit, no JVD, and thyroid not enlarged, symmetric, no tenderness/mass/nodules Lungs: clear to auscultation bilaterally Heart: regular rate and rhythm, S1, S2 normal, no murmur, click, rub or gallop Abdomen: soft, non-tender; bowel sounds normal; no masses,  no organomegaly Extremities: extremities normal, atraumatic, no cyanosis or edema Pulses: 2+ and symmetric Skin: Skin color, texture, turgor normal. No rashes or lesions Neurologic: Grossly normal Psych: Pleasant  EKG: Normal sinus rhythm at 65, minimal voltage criteria for LVH, nonspecific T wave changes- personally reviewed  ASSESSMENT: Unstable angina Coronary artery disease with prior PCI and occluded right coronary with left-to-right collaterals, 50% diffuse LAD stenosis (10/2020) Mixed dyslipidemia, goal LDL less than 70 Statin allergic - hives, now on Repatha  PLAN: 1.   Ms. Nadal has had progressive chest pain which is concerning for unstable angina.  This is despite maximal medical therapy.  She is requiring frequent sublingual nitroglycerin on top of high-dose isosorbide.  She will need a repeat heart catheterization and likely PCI if there is been progression of her LAD stenosis, for example.  May also need to consider a intervention to the RCA CTO.  I  discussed the risk, benefits and alternatives to cardiac catheterization with her today and she is agreeable to proceed.  We have tentatively scheduled her procedure for next Monday with Dr. Saunders Revel.  Finally, she would like to follow-up with me in the future.  We will see if we can arrange this in [place] since its more convenient for her.  We will check repeat lipids in about 2 months and follow-up with me in February  Pixie Casino, MD, [facility], [place] Director of the [facility] Lipid [facility]  Card[facility] Diplomate of the American Board of Clinical Lipidology Attending Cardiologist  Direct Dial: [phone]   Fax: [phone]  Website:  [email or web address] 03/26/2021, 11:00 AM

## 2021-03-26 NOTE — H&P (View-Only) (Signed)
LIPID CLINIC CONSULT NOTE  Chief Complaint:  Chest pain  Primary Care Physician: Olney Nation, MD  Primary Cardiologist:  [name], MD  HPI:  Bethany Wang is a 55 y.o. female who is being seen today for the evaluation of dyslipidemia at the request of Imogene Burn, PA-C. This is a pleasant 55 year old female that I do not recall seeing in the hospital but she remembers seeing me with a recent hospitalization.  She was hospitalized in July with chest pain and underwent left heart catheterization.  This demonstrated occlusion of the right coronary artery with the previously placed coronary stent and left to right collaterals.  There was a 50% in-stent restenosis of the proximal LAD and 90% diagonal ostial narrowing.  There is also 60 to 70% obtuse marginal narrowing and 50% distal circumflex disease.  Medical therapy was recommended.  She was referred today for management of dyslipidemia.  Unfortunately she was intolerant of statins including atorvastatin and rosuvastatin, both of which caused significant side effects.  Subsequently she had been switched by her primary care provider to [place] and has already had a dose of the medication.  It is too early to look for response to this medication however I agree with this therapy.  More concerning, however today is symptoms of progressive chest pain.  She reports that ever since this past summer she has had worsening chest pain despite long-acting nitroglycerin.  She has been using short acting nitroglycerin very frequently and gets some minimal relief however it is short-lived.  EKG today shows normal sinus rhythm with some nonspecific T wave changes.  PMHx:  Past Medical History:  Diagnosis Date   Anemia    Anxiety    Bilateral breast cysts 12/30/2015   Brain tumor ([place])    Breast cancer ([place])    Left Breast Cancer   Breast disorder    BV (bacterial vaginosis) 09/09/2015   Coronary atherosclerosis of native coronary  artery    a. s/p DES to RCA in 2013 b. DES to LAD in 2014 c. cath in 04/2016 showing patent LAD stent with D2 jailed by LAD stent and CTO of RCA with left to right collaterals present   Cyst of pharynx or nasopharynx    [name]   Decreased libido 12/25/2018   Depression    Diabetes mellitus without complication ([place])    Essential hypertension    Falls    x 3-4 in past year   GERD (gastroesophageal reflux disease)    Headache    Heart attack ([place])    x4   History of hematuria    Mixed hyperlipidemia    Personal history of chemotherapy    Left Breast Cancer   Personal history of radiation therapy    Left Breast Cancer   Pre-diabetes    PUD (peptic ulcer disease)    Seizures ([place])    Shingles 09/09/2015   Stroke ([place])    12-2017 on Plavix, only deficit is headaches   Suicide attempt ([place])    3 attempts in remote past   Trichimoniasis 08/20/2019   Treated 08/20/19    Uterine cancer ([place])    Vitamin D deficiency disease 04/03/2019    Past Surgical History:  Procedure Laterality Date   ABDOMINAL HYSTERECTOMY     BIOPSY  11/28/2019   Procedure: BIOPSY;  Surgeon: Rogene Houston, MD;  [facility];  Service: Endoscopy;;  antral, gastric body    BLADDER SURGERY  BREAST LUMPECTOMY Left    BREAST LUMPECTOMY Right 02/24/2018   Procedure: RIGHT BREAST LUMPECTOMY ERAS PATHWAY;  Surgeon: Jovita Kussmaul, MD;  Location: [facility];  Service: General;  Laterality: Right;   CARDIAC CATHETERIZATION N/A 05/10/2016   Procedure: Left Heart Cath and Coronary Angiography;  Surgeon: Belva Crome, MD;  Location: [facility];  Service: Cardiovascular;  Laterality: N/A;   CAUTERIZE INNER NOSE  07/13/2020   COLONOSCOPY  May 2010   [place]man: normal rectum, internal hemorrhoids, , benign colonic polyp   COLONOSCOPY N/A 01/05/2017   Procedure: COLONOSCOPY;  Surgeon: Rogene Houston, MD;  [facility];  Service: Endoscopy;  Laterality: N/A;  1:00    ESOPHAGEAL DILATION  11/28/2019   Procedure: ESOPHAGEAL DILATION;  Surgeon: Rogene Houston, MD;  [facility];  Service: Endoscopy;;   ESOPHAGOGASTRODUODENOSCOPY     2001 Dr. Amedeo Plenty: distal esophagitis, small hiatal hernia,. Dr. Tamala Julian 2006? no records available currently, pt also reports  EGD a few years ago with Dr. Gala Romney, do not have these reports anywhere in medical records   ESOPHAGOGASTRODUODENOSCOPY  06/02/2011   KVQ:[id number] pill impaction as described above s/p dilation of a probable cervical esophageal web/bx abnormal esophageal and gastric mucosa. + H.pylori gastritis    ESOPHAGOGASTRODUODENOSCOPY (EGD) WITH PROPOFOL N/A 11/28/2019   Procedure: ESOPHAGOGASTRODUODENOSCOPY (EGD) WITH PROPOFOL;  Surgeon: Rogene Houston, MD;  [facility];  Service: Endoscopy;  Laterality: N/A;  1020   EYE SURGERY     Removed glass   HAND SURGERY Right    INTRAVASCULAR PRESSURE WIRE/FFR STUDY N/A 10/23/2020   Procedure: INTRAVASCULAR PRESSURE WIRE/FFR STUDY;  Surgeon: Belva Crome, MD;  Location: [facility] [place] [facility];  Service: Cardiovascular;  Laterality: N/A;   Left breast lumpectomy     Benign   LEFT HEART CATH AND CORONARY ANGIOGRAPHY N/A 10/25/2018   Procedure: LEFT HEART CATH AND CORONARY ANGIOGRAPHY;  Surgeon: Wellington Hampshire, MD;  Location: [facility] [facility];  Service: Cardiovascular;  Laterality: N/A;   LEFT HEART CATH AND CORONARY ANGIOGRAPHY N/A 10/23/2020   Procedure: LEFT HEART CATH AND CORONARY ANGIOGRAPHY;  Surgeon: Belva Crome, MD;  Location: [place] [facility];  Service: Cardiovascular;  Laterality: N/A;   LEFT HEART CATHETERIZATION WITH CORONARY ANGIOGRAM N/A 07/03/2014   Procedure: LEFT HEART CATHETERIZATION WITH CORONARY ANGIOGRAM;  Surgeon: Belva Crome, MD;  Location: [facility];  Service: Cardiovascular;  Laterality: N/A;   POLYPECTOMY  01/05/2017   Procedure: POLYPECTOMY;  Surgeon: Rogene Houston, MD;  [facility];  Service: Endoscopy;;  colon    SHOULDER SURGERY Left     FAMHx:  Family History  Problem Relation Age of Onset   Colon cancer Paternal Grandfather    Cancer Father    Cancer Maternal Uncle    Alzheimer's disease Paternal Aunt    Cirrhosis Maternal Uncle    Cancer Paternal Aunt        lung   Aneurysm Mother        brain   Heart disease Brother    Heart attack Brother    Mental illness Daughter    ADD / ADHD Daughter    Bipolar disorder Daughter    Mental illness Son    ADD / ADHD Son    Bipolar disorder Son    Mental illness Son    ADD / ADHD Son    Bipolar disorder Son     SOCHx:   reports that she  quit smoking about 8 years ago. Her smoking use included cigarettes and cigars. She started smoking about 37 years ago. She has a 6.40 pack-year smoking history. She has never used smokeless tobacco. She reports that she does not drink alcohol and does not use drugs.  ALLERGIES:  Allergies  Allergen Reactions   Bee Venom Anaphylaxis and Other (See Comments)    "throat swelled and had to the hospital" - Yellow jacket sting   Lipitor [Atorvastatin] Rash and Other (See Comments)    Liver damage & Upper Abdominal pain, Break out like a rash . Per last hosp visit. It exposed on inpatient stay.   Penicillins Anaphylaxis and Other (See Comments)    Has patient had a PCN reaction causing immediate rash, facial/tongue/throat swelling, SOB or lightheadedness with hypotension: Yes Has patient had a PCN reaction causing severe rash involving mucus membranes or skin necrosis: Yes Has patient had a PCN reaction that required hospitalization Yes Has patient had a PCN reaction occurring within the last 10 years: Yes If all of the above answers are "NO", then may proceed with Cephalosporin use. Patient states she carries an epi-pen   Shellfish Allergy Swelling   Tomato Itching and Swelling   Zofran [Ondansetron] Itching    ROS: Pertinent items noted in HPI and remainder of comprehensive ROS otherwise  negative.  HOME MEDS: Current Outpatient Medications on File Prior to Visit  Medication Sig Dispense Refill   albuterol (VENTOLIN HFA) 108 (90 Base) MCG/ACT inhaler Inhale into the lungs every 6 (six) hours as needed for wheezing or shortness of breath.     ALPRAZolam (XANAX) 0.5 MG tablet Take 1 tablet (0.5 mg total) by mouth 2 (two) times daily as needed for anxiety. 30 tablet 0   amLODipine (NORVASC) 10 MG tablet TAKE 1 TABLET ONCE DAILY. 90 tablet 3   aspirin EC 81 MG tablet Take 1 tablet (81 mg total) by mouth daily. 180 tablet 1   blood glucose meter kit and supplies KIT Use to check blood sugar daily. 1 each 0   clopidogrel (PLAVIX) 75 MG tablet Take 75 mg by mouth daily.     escitalopram (LEXAPRO) 20 MG tablet Take 1 tablet (20 mg total) by mouth daily. 30 tablet 3   Evolocumab (REPATHA SURECLICK [place]) Inject 599 mg into the skin.     hydrochlorothiazide (HYDRODIURIL) 12.5 MG tablet Take 12.5 mg by mouth daily.     LINZESS 145 MCG CAPS capsule Take 1 capsule (145 mcg total) by mouth daily. 30 capsule 3   metoprolol tartrate (LOPRESSOR) 25 MG tablet Take 1 tablet (25 mg total) by mouth 2 (two) times daily. 180 tablet 3   nitroGLYCERIN (NITROSTAT) 0.4 MG SL tablet DISSOLVE 1 TABLET UNDER THE TONGUE AS NEEDED FOR CHEST PAIN EVERY 5 MINUTES UP TO 3 TIMES. IF NO RELIEF CALL 911. 25 tablet 1   thyroid (ARMOUR) 60 MG tablet Take 60 mg by mouth daily before breakfast.     traMADol (ULTRAM) 50 MG tablet Take by mouth every 6 (six) hours as needed.     fluticasone-salmeterol (ADVAIR) 100-50 MCG/ACT AEPB INHALE 1 PUFF BY MOUTH TWICE DAILY 60 each 10   isosorbide mononitrate (IMDUR) 30 MG 24 hr tablet Take 90 mg by mouth daily.     Current Facility-Administered Medications on File Prior to Visit  Medication Dose Route Frequency Provider Last Rate Last Admin   NONFORMULARY OR COMPOUNDED ITEM 0.2 mL  0.2 mL Intramuscular Q7 days [name], MD  0.2 mL at 10/23/19 1002    LABS/IMAGING: No  results found for this or any previous visit (from the past 48 hour(s)). No results found.  LIPID PANEL:    Component Value Date/Time   CHOL 216 (H) 12/29/2020 1518   TRIG 86 12/29/2020 1518   HDL 50 12/29/2020 1518   CHOLHDL 4.3 12/29/2020 1518   VLDL 17 12/29/2020 1518   LDLCALC 149 (H) 12/29/2020 1518   LDLCALC 146 (H) 07/03/2020 1334    WEIGHTS: Wt Readings from Last 3 Encounters:  03/26/21 157 lb 3.2 oz (71.3 kg)  03/10/21 161 lb (73 kg)  02/17/21 161 lb (73 kg)    VITALS: BP 134/82    Pulse 73    Ht 5' 4.5" (1.638 m)    Wt 157 lb 3.2 oz (71.3 kg)    SpO2 95%    BMI 26.57 kg/m   EXAM: General appearance: alert and no distress Neck: no carotid bruit, no JVD, and thyroid not enlarged, symmetric, no tenderness/mass/nodules Lungs: clear to auscultation bilaterally Heart: regular rate and rhythm, S1, S2 normal, no murmur, click, rub or gallop Abdomen: soft, non-tender; bowel sounds normal; no masses,  no organomegaly Extremities: extremities normal, atraumatic, no cyanosis or edema Pulses: 2+ and symmetric Skin: Skin color, texture, turgor normal. No rashes or lesions Neurologic: Grossly normal Psych: Pleasant  EKG: Normal sinus rhythm at 65, minimal voltage criteria for LVH, nonspecific T wave changes- personally reviewed  ASSESSMENT: Unstable angina Coronary artery disease with prior PCI and occluded right coronary with left-to-right collaterals, 50% diffuse LAD stenosis (10/2020) Mixed dyslipidemia, goal LDL less than 70 Statin allergic - hives, now on Repatha  PLAN: 1.   Bethany Wang has had progressive chest pain which is concerning for unstable angina.  This is despite maximal medical therapy.  She is requiring frequent sublingual nitroglycerin on top of high-dose isosorbide.  She will need a repeat heart catheterization and likely PCI if there is been progression of her LAD stenosis, for example.  May also need to consider a intervention to the RCA CTO.  I  discussed the risk, benefits and alternatives to cardiac catheterization with her today and she is agreeable to proceed.  We have tentatively scheduled her procedure for next Monday with Dr. Saunders Revel.  Finally, she would like to follow-up with me in the future.  We will see if we can arrange this in [place] since its more convenient for her.  We will check repeat lipids in about 2 months and follow-up with me in February  Pixie Casino, MD, [facility], [place] Director of the Advanced Lipid Disorders &  Cardiovascular Risk Reduction Clinic Diplomate of the American Board of Clinical Lipidology Attending Cardiologist  Direct Dial: [phone]   Fax: [phone]  Website:  [email or web address] Mosely 03/26/2021, 11:00 AM

## 2021-03-26 NOTE — Patient Instructions (Signed)
Medication Instructions:  NO CHANGES   *If you need a refill on your cardiac medications before your next appointment, please call your pharmacy*   Lab Work: BMET, CBC today   FASTING lipid panel before your next appointment with Dr. Debara Pickett (Feb 2023)  If you have labs (blood work) drawn today and your tests are completely normal, you will receive your results only by: [place] (if you have MyChart) OR A paper copy in the mail If you have any lab test that is abnormal or we need to change your treatment, we will call you to review the results.   Testing/Procedures: Cardiac Catheterization at [place]: At [facility], you and your health needs are our priority.  As part of our continuing mission to provide you with exceptional heart care, we have created designated Provider Care Teams.  These Care Teams include your primary Cardiologist (physician) and Advanced Practice Providers (APPs -  Physician Assistants and Nurse Practitioners) who all work together to provide you with the care you need, when you need it.  We recommend signing up for the patient portal called "MyChart".  Sign up information is provided on this After Visit Summary.  MyChart is used to connect with patients for Virtual Visits (Telemedicine).  Patients are able to view lab/test results, encounter notes, upcoming appointments, etc.  Non-urgent messages can be sent to your provider as well.   To learn more about what you can do with MyChart, go to [email or web address].    Your next appointment:   Feb 2023 with Dr. Debara Pickett -- [facility] Office  Other Instructions  [facility] MEDCENTER MEDCENTER [facility] CARDIOLOGY [place] [address] Dept: [place]  03/26/2021  You are scheduled for a Cardiac Catheterization on Monday, December 19 with Dr. Harrell Gave End.  1. Please arrive at the [facility] (Main  Entrance A) at [facility]: [place], [place] [address] at 7:00 AM (This time is two hours before your procedure to ensure your preparation). Free valet parking service is available.   Special note: Every effort is made to have your procedure done on time. Please understand that emergencies sometimes delay scheduled procedures.  2. Diet: Do not eat solid foods after midnight.  The patient may have clear liquids until 5am upon the day of the procedure.  3. Labs: You will need to have blood drawn today -- 3rd floor of this building  4. Medication instructions in preparation for your procedure:  On the morning of your procedure, take your Aspirin/Plavix and any morning medicines NOT listed above.  You may use sips of water.  5. Plan for one night stay--bring personal belongings. 6. Bring a current list of your medications and current insurance cards. 7. You MUST have a responsible person to drive you home. 8. Someone MUST be with you the first 24 hours after you arrive home or your discharge will be delayed. 9. Please wear clothes that are easy to get on and off and wear slip-on shoes.  Thank you for allowing [place] to care for you!   -- [facility]

## 2021-03-27 ENCOUNTER — Ambulatory Visit (HOSPITAL_COMMUNITY)
Admission: RE | Admit: 2021-03-27 | Discharge: 2021-03-27 | Disposition: A | Discharge status: Home / Self Care | Payer: Medicare Other | Source: Ambulatory Visit | Attending: Internal Medicine | Admitting: Internal Medicine

## 2021-03-27 DIAGNOSIS — Z1231 Encounter for screening mammogram for malignant neoplasm of breast: Secondary | ICD-10-CM | POA: Insufficient documentation

## 2021-03-27 LAB — CBC
Hematocrit: 44.6 % (ref 34.0–46.6)
Hemoglobin: 14.1 g/dL (ref 11.1–15.9)
MCH: 27.4 pg (ref 26.6–33.0)
MCHC: 31.6 g/dL (ref 31.5–35.7)
MCV: 87 fL (ref 79–97)
Platelets: 249 10*3/uL (ref 150–450)
RBC: 5.14 x10E6/uL (ref 3.77–5.28)
RDW: 13.1 % (ref 11.7–15.4)
WBC: 7.9 10*3/uL (ref 3.4–10.8)

## 2021-03-27 LAB — BASIC METABOLIC PANEL
BUN/Creatinine Ratio: 10 (ref 9–23)
BUN: 9 mg/dL (ref 6–24)
CO2: 25 mmol/L (ref 20–29)
Calcium: 9.7 mg/dL (ref 8.7–10.2)
Chloride: 103 mmol/L (ref 96–106)
Creatinine, Ser: 0.86 mg/dL (ref 0.57–1.00)
Glucose: 85 mg/dL (ref 70–99)
Potassium: 3.8 mmol/L (ref 3.5–5.2)
Sodium: 143 mmol/L (ref 134–144)
eGFR: 80 mL/min/{1.73_m2} (ref >59)

## 2021-03-30 ENCOUNTER — Other Ambulatory Visit: Payer: Self-pay

## 2021-03-30 ENCOUNTER — Encounter (HOSPITAL_COMMUNITY): Payer: Self-pay | Admitting: Internal Medicine

## 2021-03-30 ENCOUNTER — Ambulatory Visit (HOSPITAL_COMMUNITY)
Admission: RE | Disposition: A | Discharge status: Home / Self Care | Payer: Medicare Other | Source: Home / Self Care | Attending: Internal Medicine

## 2021-03-30 ENCOUNTER — Ambulatory Visit (HOSPITAL_COMMUNITY)
Admission: RE | Admit: 2021-03-30 | Discharge: 2021-03-30 | Disposition: A | Discharge status: Home / Self Care | Payer: Medicare Other | Attending: Internal Medicine | Admitting: Internal Medicine

## 2021-03-30 DIAGNOSIS — I25110 Atherosclerotic heart disease of native coronary artery with unstable angina pectoris: Secondary | ICD-10-CM | POA: Diagnosis not present

## 2021-03-30 DIAGNOSIS — I200 Unstable angina: Secondary | ICD-10-CM | POA: Diagnosis present

## 2021-03-30 DIAGNOSIS — Z853 Personal history of malignant neoplasm of breast: Secondary | ICD-10-CM | POA: Diagnosis not present

## 2021-03-30 DIAGNOSIS — I2582 Chronic total occlusion of coronary artery: Secondary | ICD-10-CM | POA: Diagnosis not present

## 2021-03-30 DIAGNOSIS — Z8673 Personal history of transient ischemic attack (TIA), and cerebral infarction without residual deficits: Secondary | ICD-10-CM | POA: Insufficient documentation

## 2021-03-30 DIAGNOSIS — Z955 Presence of coronary angioplasty implant and graft: Secondary | ICD-10-CM | POA: Insufficient documentation

## 2021-03-30 DIAGNOSIS — E119 Type 2 diabetes mellitus without complications: Secondary | ICD-10-CM | POA: Diagnosis not present

## 2021-03-30 DIAGNOSIS — E782 Mixed hyperlipidemia: Secondary | ICD-10-CM | POA: Insufficient documentation

## 2021-03-30 DIAGNOSIS — I25119 Atherosclerotic heart disease of native coronary artery with unspecified angina pectoris: Secondary | ICD-10-CM

## 2021-03-30 HISTORY — PX: CORONARY PRESSURE/FFR STUDY: CATH118243

## 2021-03-30 HISTORY — PX: LEFT HEART CATH AND CORONARY ANGIOGRAPHY: CATH118249

## 2021-03-30 LAB — GLUCOSE, CAPILLARY
Glucose-Capillary: 133 mg/dL — ABNORMAL HIGH (ref 70–99)
Glucose-Capillary: 85 mg/dL (ref 70–99)

## 2021-03-30 LAB — POCT ACTIVATED CLOTTING TIME
Activated Clotting Time: 342 seconds
Activated Clotting Time: 311 seconds

## 2021-03-30 SURGERY — LEFT HEART CATH AND CORONARY ANGIOGRAPHY
Anesthesia: LOCAL

## 2021-03-30 MED ORDER — SODIUM CHLORIDE 0.9% FLUSH: 3.0000 mL | INTRAVENOUS | Status: DC | PRN

## 2021-03-30 MED ORDER — ISOSORBIDE MONONITRATE ER 60 MG PO TB24: 60.0000 mg | ORAL_TABLET | Freq: Two times a day (BID) | ORAL | 5 refills | Status: DC

## 2021-03-30 MED ORDER — HEPARIN SODIUM (PORCINE) 1000 UNIT/ML IJ SOLN
INTRAMUSCULAR | Status: DC | PRN
  Administered 2021-03-30 (×2): 3500 [IU] via INTRAVENOUS

## 2021-03-30 MED ORDER — HEPARIN SODIUM (PORCINE) 1000 UNIT/ML IJ SOLN
INTRAMUSCULAR | Status: AC
  Filled 2021-03-30: qty 10

## 2021-03-30 MED ORDER — ASPIRIN 81 MG PO CHEW: 81.0000 mg | CHEWABLE_TABLET | ORAL | Status: DC

## 2021-03-30 MED ORDER — MIDAZOLAM HCL 2 MG/2ML IJ SOLN
INTRAMUSCULAR | Status: DC | PRN
  Administered 2021-03-30 (×3): 1 mg via INTRAVENOUS

## 2021-03-30 MED ORDER — SODIUM CHLORIDE 0.9% FLUSH: 3.0000 mL | Freq: Two times a day (BID) | INTRAVENOUS | Status: DC

## 2021-03-30 MED ORDER — LIDOCAINE HCL (PF) 1 % IJ SOLN
INTRAMUSCULAR | Status: DC | PRN
  Administered 2021-03-30: 2 mL

## 2021-03-30 MED ORDER — CLOPIDOGREL BISULFATE 75 MG PO TABS: 75.0000 mg | ORAL_TABLET | Freq: Every day | ORAL | Status: DC

## 2021-03-30 MED ORDER — SODIUM CHLORIDE 0.9 % IV SOLN: 250.0000 mL | INTRAVENOUS | Status: DC | PRN

## 2021-03-30 MED ORDER — MIDAZOLAM HCL 2 MG/2ML IJ SOLN
INTRAMUSCULAR | Status: AC
  Filled 2021-03-30: qty 2

## 2021-03-30 MED ORDER — SODIUM CHLORIDE 0.9 % WEIGHT BASED INFUSION: 1.0000 mL/kg/h | INTRAVENOUS | Status: DC

## 2021-03-30 MED ORDER — LABETALOL HCL 5 MG/ML IV SOLN: 10.0000 mg | INTRAVENOUS | Status: DC | PRN

## 2021-03-30 MED ORDER — FENTANYL CITRATE (PF) 100 MCG/2ML IJ SOLN
INTRAMUSCULAR | Status: AC
  Filled 2021-03-30: qty 2

## 2021-03-30 MED ORDER — HEPARIN (PORCINE) IN NACL 2000-0.9 UNIT/L-% IV SOLN
INTRAVENOUS | Status: DC | PRN
  Administered 2021-03-30: 10:00:00 1000 mL

## 2021-03-30 MED ORDER — LIDOCAINE HCL (PF) 1 % IJ SOLN
INTRAMUSCULAR | Status: AC
  Filled 2021-03-30: qty 30

## 2021-03-30 MED ORDER — VERAPAMIL HCL 2.5 MG/ML IV SOLN
INTRAVENOUS | Status: AC
  Filled 2021-03-30: qty 2

## 2021-03-30 MED ORDER — IOHEXOL 350 MG/ML SOLN
INTRAVENOUS | Status: DC | PRN
  Administered 2021-03-30: 11:00:00 105 mL

## 2021-03-30 MED ORDER — NITROGLYCERIN 1 MG/10 ML FOR IR/CATH LAB
INTRA_ARTERIAL | Status: AC
  Filled 2021-03-30: qty 10

## 2021-03-30 MED ORDER — VERAPAMIL HCL 2.5 MG/ML IV SOLN
INTRAVENOUS | Status: DC | PRN
  Administered 2021-03-30: 10:00:00 10 mL via INTRA_ARTERIAL

## 2021-03-30 MED ORDER — SODIUM CHLORIDE 0.9 % IV SOLN: INTRAVENOUS | Status: DC

## 2021-03-30 MED ORDER — FENTANYL CITRATE (PF) 100 MCG/2ML IJ SOLN
INTRAMUSCULAR | Status: DC | PRN
  Administered 2021-03-30: 50 ug via INTRAVENOUS
  Administered 2021-03-30 (×2): 25 ug via INTRAVENOUS

## 2021-03-30 MED ORDER — HYDRALAZINE HCL 20 MG/ML IJ SOLN: 10.0000 mg | INTRAMUSCULAR | Status: DC | PRN

## 2021-03-30 MED ORDER — HEPARIN (PORCINE) IN NACL 1000-0.9 UT/500ML-% IV SOLN
INTRAVENOUS | Status: AC
  Filled 2021-03-30: qty 1000

## 2021-03-30 MED ORDER — ACETAMINOPHEN 325 MG PO TABS: 650.0000 mg | ORAL_TABLET | ORAL | Status: DC | PRN

## 2021-03-30 MED ORDER — SODIUM CHLORIDE 0.9 % WEIGHT BASED INFUSION
3.0000 mL/kg/h | INTRAVENOUS | Status: AC
  Administered 2021-03-30: 08:00:00 3 mL/kg/h via INTRAVENOUS

## 2021-03-30 MED ORDER — NITROGLYCERIN 1 MG/10 ML FOR IR/CATH LAB
INTRA_ARTERIAL | Status: DC | PRN
  Administered 2021-03-30: 200 ug

## 2021-03-30 SURGICAL SUPPLY — 12 items
CATH INFINITI 5 FR MPA2 (CATHETERS) ×2 IMPLANT
CATH INFINITI 5FR MULTPACK ANG (CATHETERS) ×2 IMPLANT
CATH VISTA GUIDE 6FR XBLAD3.5 (CATHETERS) ×2 IMPLANT
DEVICE RAD COMP TR BAND LRG (VASCULAR PRODUCTS) ×2 IMPLANT
GLIDESHEATH SLEND SS 6F .021 (SHEATH) ×2 IMPLANT
GUIDEWIRE INQWIRE 1.5J.035X260 (WIRE) IMPLANT
GUIDEWIRE PRESSURE X 175 (WIRE) ×2 IMPLANT
INQWIRE 1.5J .035X260CM (WIRE) ×3
KIT HEART LEFT (KITS) ×3 IMPLANT
PACK CARDIAC CATHETERIZATION (CUSTOM PROCEDURE TRAY) ×3 IMPLANT
TRANSDUCER W/STOPCOCK (MISCELLANEOUS) ×3 IMPLANT
TUBING CIL FLEX 10 FLL-RA (TUBING) ×3 IMPLANT

## 2021-03-30 NOTE — Interval H&P Note (Signed)
History and Physical Interval Note:  03/30/2021 9:43 AM  Bethany Wang  has presented today for surgery, with the diagnosis of unstable angina.  The various methods of treatment have been discussed with the patient and family. After consideration of risks, benefits and other options for treatment, the patient has consented to  Procedure(s): LEFT HEART CATH AND CORONARY ANGIOGRAPHY (N/A) as a surgical intervention.  The patient's history has been reviewed, patient examined, no change in status, stable for surgery.  I have reviewed the patient's chart and labs.  Questions were answered to the patient's satisfaction.    Cath Lab Visit (complete for each Cath Lab visit)  Clinical Evaluation Leading to the Procedure:   ACS: No.  Non-ACS:    Anginal Classification: CCS III  Anti-ischemic medical therapy: Maximal Therapy (2 or more classes of medications)  Non-Invasive Test Results: No non-invasive testing performed  Prior CABG: No previous CABG  Kieth Hartis

## 2021-03-30 NOTE — Discharge Instructions (Signed)
Radial Site Care  This sheet gives you information about how to care for yourself after your procedure. Your health care provider may also give you more specific instructions. If you have problems or questions, contact your health care provider. What can I expect after the procedure? After the procedure, it is common to have: Bruising and tenderness at the catheter insertion area. Follow these instructions at home: Medicines Take over-the-counter and prescription medicines only as told by your health care provider. Insertion site care Follow instructions from your health care provider about how to take care of your insertion site. Make sure you: Wash your hands with soap and water before you remove your bandage (dressing). If soap and water are not available, use hand sanitizer. May remove dressing in 24 hours. Check your insertion site every day for signs of infection. Check for: Redness, swelling, or pain. Fluid or blood. Pus or a bad smell. Warmth. Do no take baths, swim, or use a hot tub for 5 days. You may shower 24-48 hours after the procedure. Remove the dressing and gently wash the site with plain soap and water. Pat the area dry with a clean towel. Do not rub the site. That could cause bleeding. Do not apply powder or lotion to the site. Activity  For 24 hours after the procedure, or as directed by your health care provider: Do not flex or bend the affected arm. Do not push or pull heavy objects with the affected arm. Do not drive yourself home from the hospital or clinic. You may drive 24 hours after the procedure. Do not operate machinery or power tools. KEEP ARM ELEVATED THE REMAINDER OF THE DAY. Do not push, pull or lift anything that is heavier than 10 lb for 5 days. Ask your health care provider when it is okay to: Return to work or school. Resume usual physical activities or sports. Resume sexual activity. General instructions If the catheter site starts to  bleed, raise your arm and put firm pressure on the site. If the bleeding does not stop, get help right away. This is a medical emergency. DRINK PLENTY OF FLUIDS FOR THE NEXT 2-3 DAYS. No alcohol consumption for 24 hours after receiving sedation. If you went home on the same day as your procedure, a responsible adult should be with you for the first 24 hours after you arrive home. Keep all follow-up visits as told by your health care provider. This is important. Contact a health care provider if: You have a fever. You have redness, swelling, or yellow drainage around your insertion site. Get help right away if: You have unusual pain at the radial site. The catheter insertion area swells very fast. The insertion area is bleeding, and the bleeding does not stop when you hold steady pressure on the area. Your arm or hand becomes pale, cool, tingly, or numb. These symptoms may represent a serious problem that is an emergency. Do not wait to see if the symptoms will go away. Get medical help right away. Call your local emergency services (911 in the [place] Do not drive yourself to the hospital. Summary After the procedure, it is common to have bruising and tenderness at the site. Follow instructions from your health care provider about how to take care of your radial site wound. Check the wound every day for signs of infection.  This information is not intended to replace advice given to you by your health care provider. Make sure you discuss any questions you have with   your health care provider. Document Revised: 05/04/2017 Document Reviewed: 05/04/2017 Elsevier Patient Education  2020 Elsevier Inc.  

## 2021-03-31 MED FILL — Heparin Sod (Porcine)-NaCl IV Soln 1000 Unit/500ML-0.9%: INTRAVENOUS | Qty: 1000 | Status: AC

## 2021-04-18 IMAGING — MG DIGITAL SCREENING BILAT W/ TOMO W/ CAD
8 series · 9 of 24 positions shown · non-contrast
Comparison: Previous exam(s).

CLINICAL DATA: Screening.

EXAM:
DIGITAL SCREENING BILATERAL MAMMOGRAM WITH TOMO AND CAD

[L MLO synth-2D]
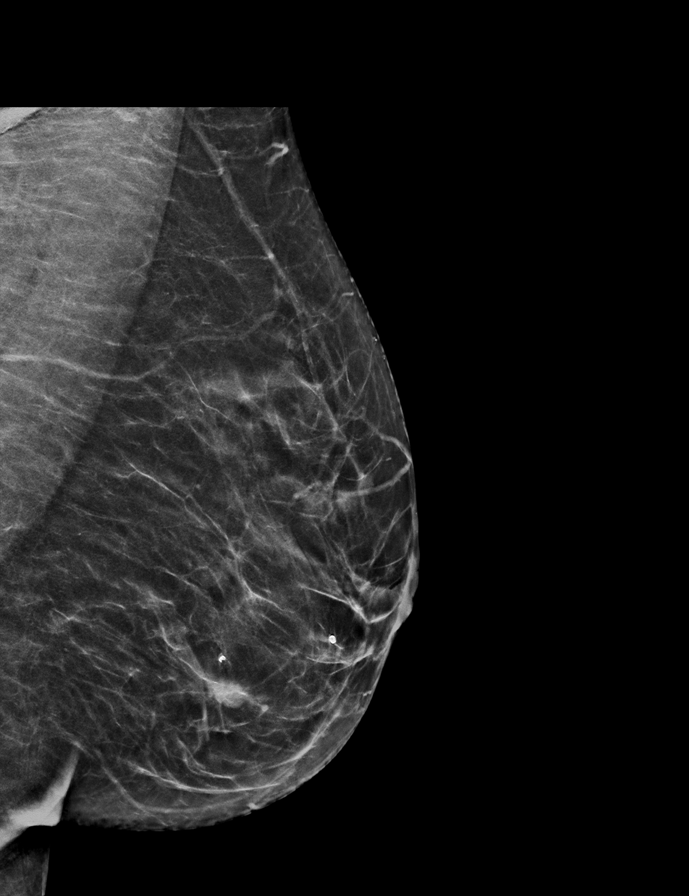

[L CC synth-2D]
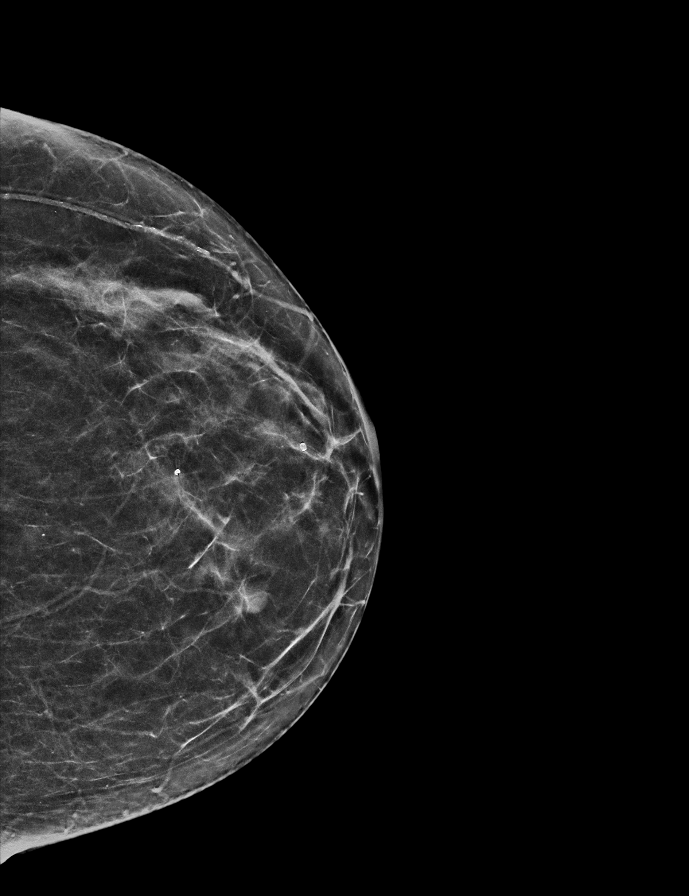

[R MLO synth-2D]
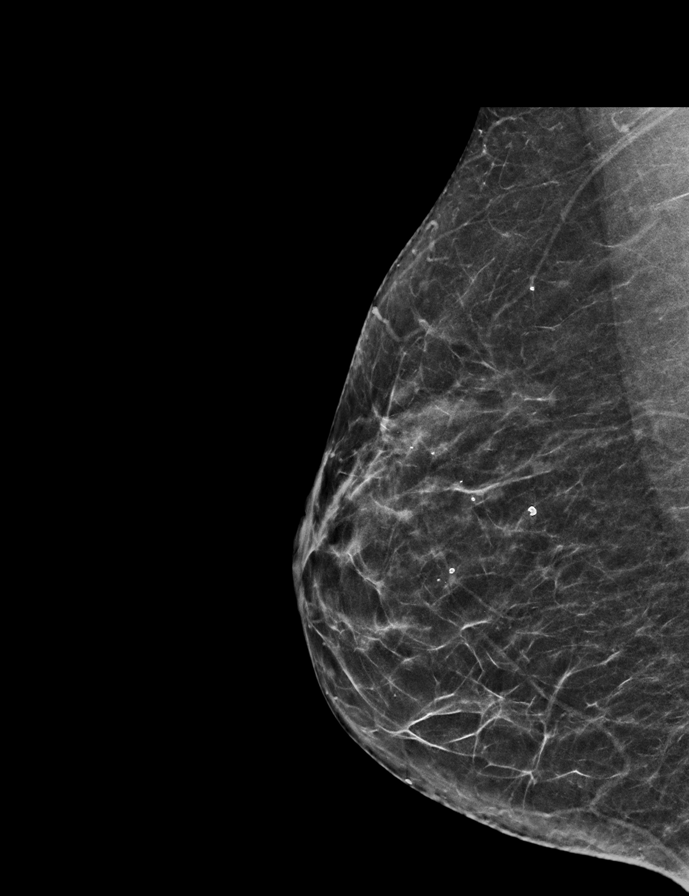

[R CC synth-2D]
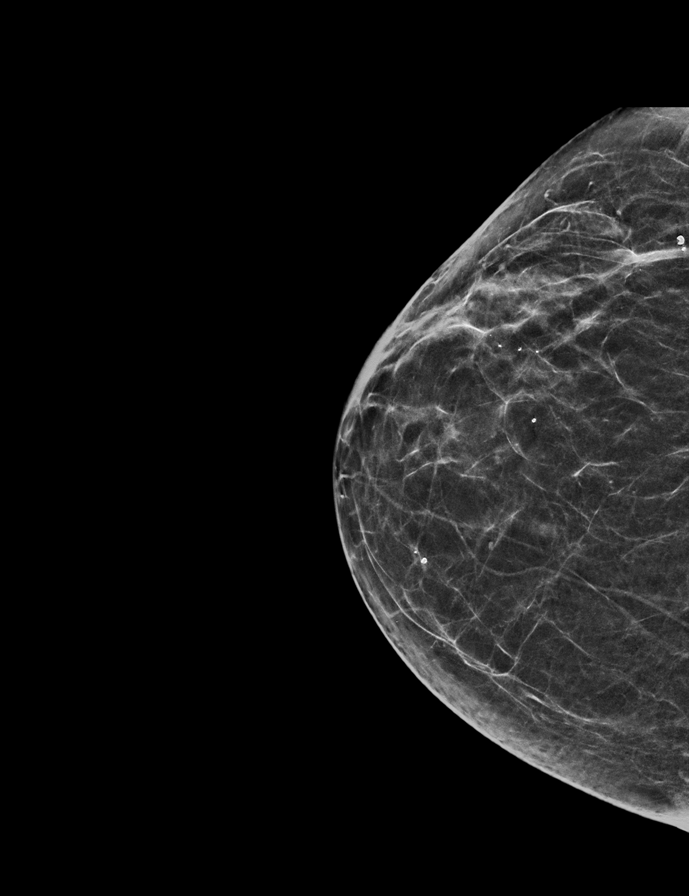

[L CC tomo · 2 of 52 frames shown]
[frame 17/52]
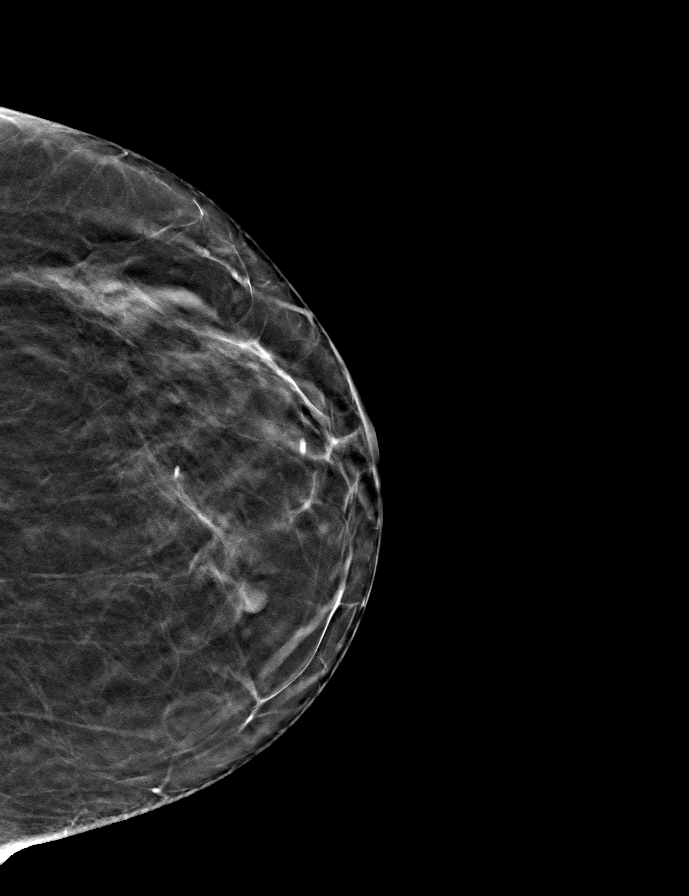
[frame 27/52]
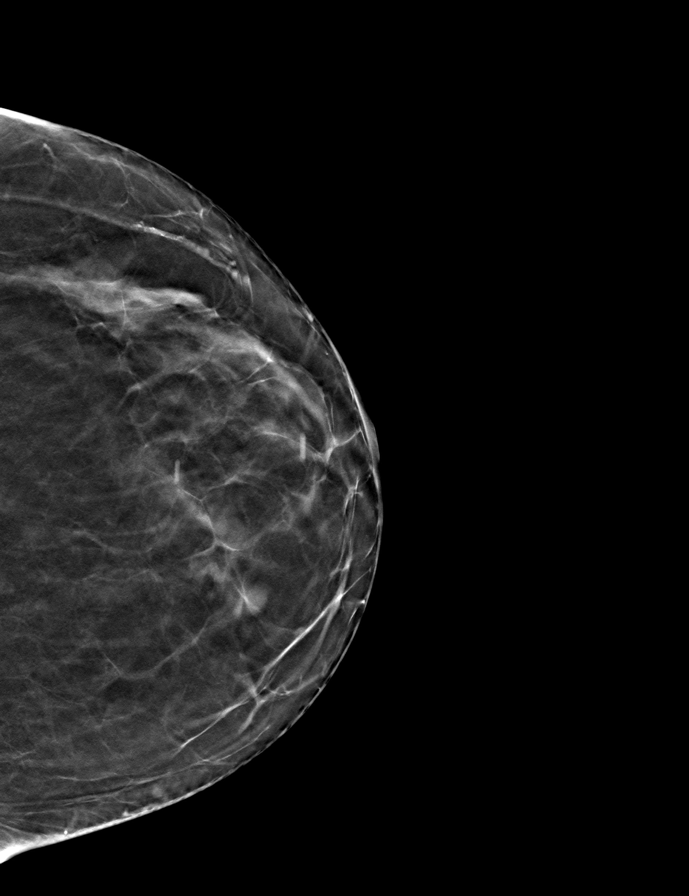

[L MLO tomo · tomo slice 31/60.0]
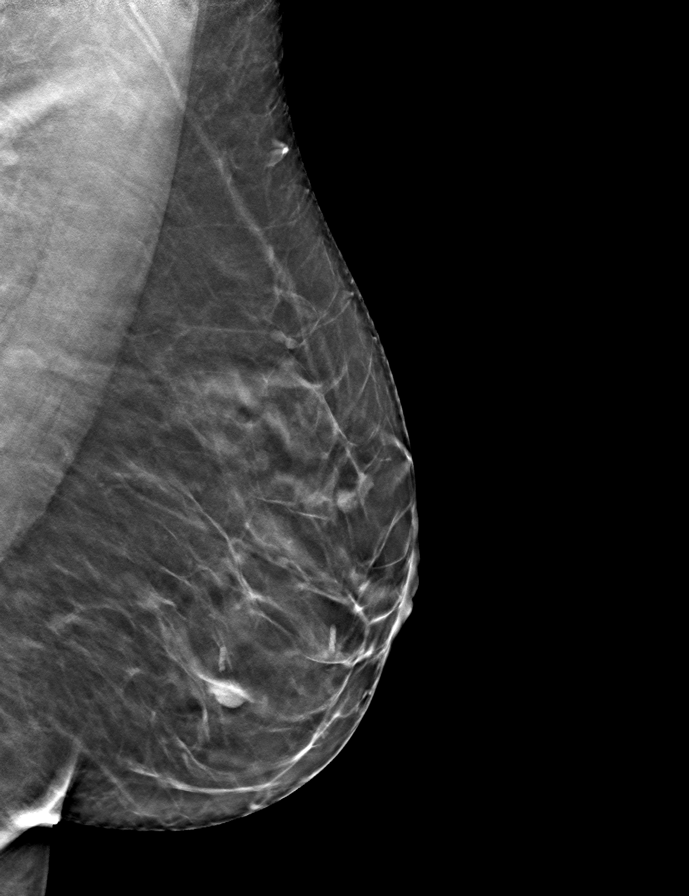

[R CC tomo · tomo slice 27/54.0]
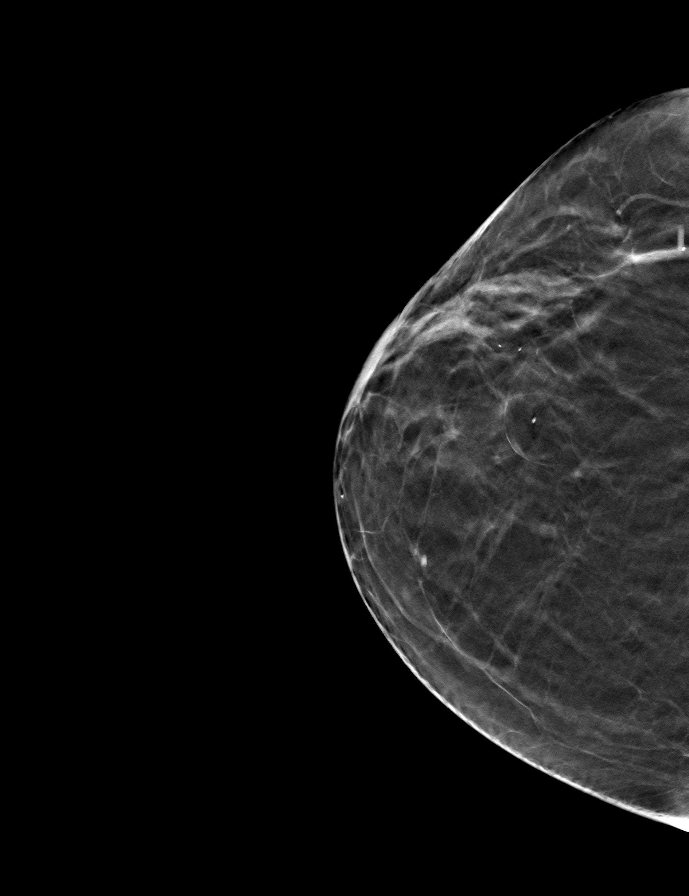

[R MLO tomo · tomo slice 29/57.0]
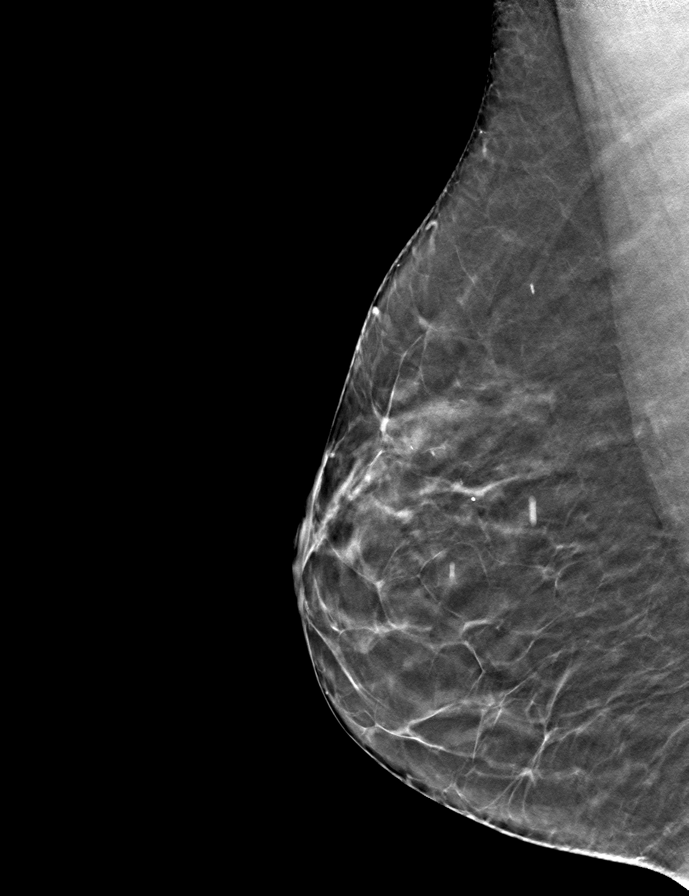

[9 of 24 positions shown; findings below may reference images not displayed]

ACR Breast Density Category b: There are scattered areas of
fibroglandular density.
FINDINGS: In the left breast, a possible mass warrants further evaluation. In
the right breast, no findings suspicious for malignancy.

Images were processed with CAD.
IMPRESSION: Further evaluation is suggested for a possible mass in the left
breast.

RECOMMENDATION:
Diagnostic mammogram and possibly ultrasound of the left breast.
(Code:[id number])

The patient will be contacted regarding the findings, and additional
imaging will be scheduled.

BI-RADS CATEGORY  0: Incomplete. Need additional imaging evaluation
and/or prior mammograms for comparison.

## 2021-04-22 ENCOUNTER — Ambulatory Visit: Payer: Medicare Other | Admitting: Cardiology

## 2021-04-23 ENCOUNTER — Ambulatory Visit: Payer: Medicare Other | Admitting: Cardiology

## 2021-04-29 DIAGNOSIS — I2510 Atherosclerotic heart disease of native coronary artery without angina pectoris: Secondary | ICD-10-CM | POA: Diagnosis not present

## 2021-04-29 DIAGNOSIS — I10 Essential (primary) hypertension: Secondary | ICD-10-CM | POA: Diagnosis not present

## 2021-04-29 DIAGNOSIS — E785 Hyperlipidemia, unspecified: Secondary | ICD-10-CM | POA: Diagnosis not present

## 2021-04-29 DIAGNOSIS — E039 Hypothyroidism, unspecified: Secondary | ICD-10-CM | POA: Diagnosis not present

## 2021-04-29 DIAGNOSIS — T466X5A Adverse effect of antihyperlipidemic and antiarteriosclerotic drugs, initial encounter: Secondary | ICD-10-CM | POA: Diagnosis not present

## 2021-04-29 DIAGNOSIS — G720 Drug-induced myopathy: Secondary | ICD-10-CM | POA: Diagnosis not present

## 2021-05-05 ENCOUNTER — Ambulatory Visit: Payer: Medicare Other | Admitting: Urology

## 2021-05-05 DIAGNOSIS — R350 Frequency of micturition: Secondary | ICD-10-CM

## 2021-05-05 NOTE — Progress Notes (Deleted)
Assessment: 1. Urinary frequency   2. Excessive fluid intake     Plan: I reviewed the patient's voiding diary.  This shows significant fluid intake throughout the day which is the likely cause of her urinary frequency.  Given this fluid intake, I do not think that any therapy for her bladder will improve her symptoms.  I recommended that she attempt to decrease her fluid intake as this will likely decrease her frequency. Return to office in 2 months.  Chief Complaint:  No chief complaint on file.    History of Present Illness:  Bethany Wang is a 56 y.o. year old female who is seen for further evaluation of urinary frequency.  At her initial visit on 01/30/21, she reported symptoms of urinary frequency for approximately 8 months.  Her symptoms began after passage of a ureteral calculus.  She was diagnosed with a 3 mm left mid ureteral calculus in 12/21.  She was scheduled to undergo lithotripsy but apparently passed the stone spontaneously.  A repeat CT scan from 2/22 was negative for stones.  Since passage of the stone, she reported significant urinary frequency voiding 10-15 times per day and nocturia 4-5 times per night.  She also has had urge incontinence.  She reports significant fluid intake throughout the day, drinking 2 pots of decaffeinated coffee in the morning and at least 1-1/2 gallons of water per day.  She uses always pads for management of her incontinence.  No dysuria or gross hematuria.  No recent UTIs.  She was given a trial of Gemtesa 75 mg daily in 10/22.  She noted some slight improvement in her frequency with the [facility]. She continued to have significant fluid intake throughout the day.  She was started on a diuretic. Voiding diary results: Day 1 15.33 L intake/12.7 L output Day 2 12.3 L intake/11.2 L output Day 3 10.9 L intake/7.44 L ouput No dysuria or gross hematuria.  Portions of the above documentation were copied from a prior visit for review purposes  only.   Past Medical History:  Past Medical History:  Diagnosis Date   Anemia    Anxiety    Bilateral breast cysts 12/30/2015   Brain tumor ([place])    Breast cancer ([place])    Left Breast Cancer   Breast disorder    BV (bacterial vaginosis) 09/09/2015   Coronary atherosclerosis of native coronary artery    a. s/p DES to RCA in 2013 b. DES to LAD in 2014 c. cath in 04/2016 showing patent LAD stent with D2 jailed by LAD stent and CTO of RCA with left to right collaterals present   Cyst of pharynx or nasopharynx    [name]   Decreased libido 12/25/2018   Depression    Diabetes mellitus without complication ([place])    Essential hypertension    Falls    x 3-4 in past year   GERD (gastroesophageal reflux disease)    Headache    Heart attack ([place])    x4   History of hematuria    Mixed hyperlipidemia    Personal history of chemotherapy    Left Breast Cancer   Personal history of radiation therapy    Left Breast Cancer   Pre-diabetes    PUD (peptic ulcer disease)    Seizures ([place])    Shingles 09/09/2015   Stroke ([place])    12-2017 on Plavix, only deficit is headaches   Suicide attempt ([place])    3 attempts in remote past  Trichimoniasis 08/20/2019   Treated 08/20/19    Uterine cancer ([place])    Vitamin D deficiency disease 04/03/2019    Past Surgical History:  Past Surgical History:  Procedure Laterality Date   ABDOMINAL HYSTERECTOMY     BIOPSY  11/28/2019   Procedure: BIOPSY;  Surgeon: Rogene Houston, MD;  [facility];  Service: Endoscopy;;  antral, gastric body    BLADDER SURGERY     BREAST LUMPECTOMY Left    BREAST LUMPECTOMY Right 02/24/2018   Procedure: RIGHT BREAST LUMPECTOMY ERAS PATHWAY;  Surgeon: Jovita Kussmaul, MD;  Location: [place];  Service: General;  Laterality: Right;   CARDIAC CATHETERIZATION N/A 05/10/2016   Procedure: Left Heart Cath and Coronary Angiography;  Surgeon: Belva Crome, MD;  Location: [place] [facility];  Service:  Cardiovascular;  Laterality: N/A;   CAUTERIZE INNER NOSE  07/13/2020   COLONOSCOPY  May 2010   Fleishman: normal rectum, internal hemorrhoids, , benign colonic polyp   COLONOSCOPY N/A 01/05/2017   Procedure: COLONOSCOPY;  Surgeon: Rogene Houston, MD;  [facility];  Service: Endoscopy;  Laterality: N/A;  1:00   ESOPHAGEAL DILATION  11/28/2019   Procedure: ESOPHAGEAL DILATION;  Surgeon: Rogene Houston, MD;  [facility];  Service: Endoscopy;;   ESOPHAGOGASTRODUODENOSCOPY     2001 Dr. Amedeo Plenty: distal esophagitis, small hiatal hernia,. Dr. Tamala Julian 2006? no records available currently, pt also reports  EGD a few years ago with Dr. Gala Romney, do not have these reports anywhere in medical records   ESOPHAGOGASTRODUODENOSCOPY  06/02/2011   VPX:TGGYIR pill impaction as described above s/p dilation of a probable cervical esophageal web/bx abnormal esophageal and gastric mucosa. + H.pylori gastritis    ESOPHAGOGASTRODUODENOSCOPY (EGD) WITH PROPOFOL N/A 11/28/2019   Procedure: ESOPHAGOGASTRODUODENOSCOPY (EGD) WITH PROPOFOL;  Surgeon: Rogene Houston, MD;  [facility];  Service: Endoscopy;  Laterality: N/A;  1020   EYE SURGERY     Removed glass   HAND SURGERY Right    INTRAVASCULAR PRESSURE WIRE/FFR STUDY N/A 10/23/2020   Procedure: INTRAVASCULAR PRESSURE WIRE/FFR STUDY;  Surgeon: Belva Crome, MD;  Location: [place] [facility];  Service: Cardiovascular;  Laterality: N/A;   INTRAVASCULAR PRESSURE WIRE/FFR STUDY N/A 03/30/2021   Procedure: INTRAVASCULAR PRESSURE WIRE/FFR STUDY;  Surgeon: Nelva Bush, MD;  Location: [place] [facility];  Service: Cardiovascular;  Laterality: N/A;   Left breast lumpectomy     Benign   LEFT HEART CATH AND CORONARY ANGIOGRAPHY N/A 10/25/2018   Procedure: LEFT HEART CATH AND CORONARY ANGIOGRAPHY;  Surgeon: Wellington Hampshire, MD;  Location: [place] [facility];  Service: Cardiovascular;  Laterality: N/A;   LEFT HEART CATH AND CORONARY ANGIOGRAPHY  N/A 10/23/2020   Procedure: LEFT HEART CATH AND CORONARY ANGIOGRAPHY;  Surgeon: Belva Crome, MD;  Location: [place] [facility];  Service: Cardiovascular;  Laterality: N/A;   LEFT HEART CATH AND CORONARY ANGIOGRAPHY N/A 03/30/2021   Procedure: LEFT HEART CATH AND CORONARY ANGIOGRAPHY;  Surgeon: Nelva Bush, MD;  Location: [place] [facility];  Service: Cardiovascular;  Laterality: N/A;   LEFT HEART CATHETERIZATION WITH CORONARY ANGIOGRAM N/A 07/03/2014   Procedure: LEFT HEART CATHETERIZATION WITH CORONARY ANGIOGRAM;  Surgeon: Belva Crome, MD;  [facility];  Service: Cardiovascular;  Laterality: N/A;   POLYPECTOMY  01/05/2017   Procedure: POLYPECTOMY;  Surgeon: Rogene Houston, MD;  [facility];  Service: Endoscopy;;  colon   SHOULDER SURGERY Left  Allergies:  Allergies  Allergen Reactions   Bee Venom Anaphylaxis and Other (See Comments)    "throat swelled and had to the hospital" - Yellow jacket sting   Lipitor [Atorvastatin] Rash and Other (See Comments)    Liver damage & Upper Abdominal pain, Break out like a rash . Per last hosp visit. It exposed on inpatient stay.   Penicillins Anaphylaxis and Other (See Comments)    Has patient had a PCN reaction causing immediate rash, facial/tongue/throat swelling, SOB or lightheadedness with hypotension: Yes Has patient had a PCN reaction causing severe rash involving mucus membranes or skin necrosis: Yes Has patient had a PCN reaction that required hospitalization Yes Has patient had a PCN reaction occurring within the last 10 years: Yes If all of the above answers are "NO", then may proceed with Cephalosporin use. Patient states she carries an epi-pen   Shellfish Allergy Swelling   Crestor [Rosuvastatin]     Upper Abdominal pain, Break out like a rash   Tomato Itching and Swelling   Zofran [Ondansetron] Itching    Family History:  Family History  Problem Relation Age of Onset   Colon cancer Paternal  Grandfather    Cancer Father    Cancer Maternal Uncle    Alzheimer's disease Paternal Aunt    Cirrhosis Maternal Uncle    Cancer Paternal Aunt        lung   Aneurysm Mother        brain   Heart disease Brother    Heart attack Brother    Mental illness Daughter    ADD / ADHD Daughter    Bipolar disorder Daughter    Mental illness Son    ADD / ADHD Son    Bipolar disorder Son    Mental illness Son    ADD / ADHD Son    Bipolar disorder Son     Social History:  Social History   Tobacco Use   Smoking status: Former    Packs/day: 0.20    Years: 32.00    Pack years: 6.40    Types: Cigarettes, Cigars    Start date: 07/05/1983    Quit date: 04/03/2012    Years since quitting: 9.0   Smokeless tobacco: Never  Vaping Use   Vaping Use: Never used  Substance Use Topics   Alcohol use: No    Alcohol/week: 0.0 standard drinks   Drug use: No    ROS: Constitutional:  Negative for fever, chills, weight loss CV: Negative for chest pain, previous MI, hypertension Respiratory:  Negative for shortness of breath, wheezing, sleep apnea, frequent cough GI:  Negative for nausea, vomiting, bloody stool, GERD  Physical exam: There were no vitals taken for this visit. ***  Results: No results found for this or any previous visit (from the past 24 hour(s)).

## 2021-05-06 ENCOUNTER — Encounter (HOSPITAL_COMMUNITY): Payer: Self-pay

## 2021-05-06 ENCOUNTER — Other Ambulatory Visit: Payer: Self-pay

## 2021-05-06 ENCOUNTER — Emergency Department (HOSPITAL_COMMUNITY)
Admission: EM | Admit: 2021-05-06 | Discharge: 2021-05-06 | Disposition: A | Discharge status: Home / Self Care | Payer: Medicare Other | Attending: Emergency Medicine | Admitting: Emergency Medicine

## 2021-05-06 DIAGNOSIS — R112 Nausea with vomiting, unspecified: Secondary | ICD-10-CM | POA: Diagnosis not present

## 2021-05-06 DIAGNOSIS — E876 Hypokalemia: Secondary | ICD-10-CM | POA: Diagnosis not present

## 2021-05-06 DIAGNOSIS — I10 Essential (primary) hypertension: Secondary | ICD-10-CM | POA: Diagnosis not present

## 2021-05-06 DIAGNOSIS — Z79899 Other long term (current) drug therapy: Secondary | ICD-10-CM | POA: Diagnosis not present

## 2021-05-06 DIAGNOSIS — R001 Bradycardia, unspecified: Secondary | ICD-10-CM | POA: Insufficient documentation

## 2021-05-06 DIAGNOSIS — E119 Type 2 diabetes mellitus without complications: Secondary | ICD-10-CM | POA: Diagnosis not present

## 2021-05-06 DIAGNOSIS — Z7982 Long term (current) use of aspirin: Secondary | ICD-10-CM | POA: Insufficient documentation

## 2021-05-06 DIAGNOSIS — Z7951 Long term (current) use of inhaled steroids: Secondary | ICD-10-CM | POA: Diagnosis not present

## 2021-05-06 DIAGNOSIS — Z7902 Long term (current) use of antithrombotics/antiplatelets: Secondary | ICD-10-CM | POA: Diagnosis not present

## 2021-05-06 DIAGNOSIS — R109 Unspecified abdominal pain: Secondary | ICD-10-CM | POA: Diagnosis not present

## 2021-05-06 LAB — BASIC METABOLIC PANEL
Anion gap: 8 (ref 5–15)
BUN: 8 mg/dL (ref 6–20)
CO2: 27 mmol/L (ref 22–32)
Calcium: 9.2 mg/dL (ref 8.9–10.3)
Chloride: 102 mmol/L (ref 98–111)
Creatinine, Ser: 0.74 mg/dL (ref 0.44–1.00)
GFR, Estimated: >60 mL/min (ref >60)
Glucose, Bld: 172 mg/dL — ABNORMAL HIGH (ref 70–99)
Potassium: 3.2 mmol/L — ABNORMAL LOW (ref 3.5–5.1)
Sodium: 137 mmol/L (ref 135–145)

## 2021-05-06 LAB — CBC WITH DIFFERENTIAL/PLATELET
Abs Immature Granulocytes: 0.02 10*3/uL (ref 0.00–0.07)
Basophils Absolute: 0 10*3/uL (ref 0.0–0.1)
Basophils Relative: 1 %
Eosinophils Absolute: 0.1 10*3/uL (ref 0.0–0.5)
Eosinophils Relative: 1 %
HCT: 39.8 % (ref 36.0–46.0)
Hemoglobin: 12.8 g/dL (ref 12.0–15.0)
Immature Granulocytes: 0 %
Lymphocytes Relative: 58 %
Lymphs Abs: 3.8 10*3/uL (ref 0.7–4.0)
MCH: 27.4 pg (ref 26.0–34.0)
MCHC: 32.2 g/dL (ref 30.0–36.0)
MCV: 85 fL (ref 80.0–100.0)
Monocytes Absolute: 0.3 10*3/uL (ref 0.1–1.0)
Monocytes Relative: 5 %
Neutro Abs: 2.2 10*3/uL (ref 1.7–7.7)
Neutrophils Relative %: 35 %
Platelets: 262 10*3/uL (ref 150–400)
RBC: 4.68 MIL/uL (ref 3.87–5.11)
RDW: 14.7 % (ref 11.5–15.5)
WBC: 6.4 10*3/uL (ref 4.0–10.5)
nRBC: 0 % (ref 0.0–0.2)

## 2021-05-06 MED ORDER — METOCLOPRAMIDE HCL 5 MG/ML IJ SOLN
10.0000 mg | Freq: Once | INTRAMUSCULAR | Status: AC
  Administered 2021-05-06: 16:00:00 10 mg via INTRAVENOUS
  Filled 2021-05-06: qty 2

## 2021-05-06 MED ORDER — SODIUM CHLORIDE 0.9 % IV BOLUS
1000.0000 mL | Freq: Once | INTRAVENOUS | Status: AC
  Administered 2021-05-06: 16:00:00 1000 mL via INTRAVENOUS

## 2021-05-06 NOTE — Discharge Instructions (Addendum)
It is a pleasure taking care of you today!  Your labs are unremarkable in the ED today.  Your potassium was slightly low, attached is information for potassium rich foods.  You may follow-up with your primary care provider for recheck of your potassium.  Ensure to maintain fluid intake.  Take your antinausea medication that you have at home as prescribed for nausea and vomiting.  Ensure to maintain fluid intake.  You may follow-up with your primary care provider as needed.  Return to the ED if you are experiencing increasing/worsening vomiting, fever, inability to keep fluids down, worsening symptoms.

## 2021-05-06 NOTE — ED Triage Notes (Signed)
Patient states she went to PCP for vomiting since Thursday of last week. Patient states EKG performed at Dr office and said she had bradycardia. Patient sent to ED for further eval. Patient ambulatory in triage.

## 2021-05-06 NOTE — ED Provider Notes (Signed)
[facility] EMERGENCY DEPARTMENT Provider Note   CSN: [id number] Arrival date & time: 05/06/21  1401     History  Chief Complaint  Patient presents with   Emesis   Bradycardia    Bethany Wang is a 56 y.o. female with a PMHx of diabetes, hypertension, MI who presents to the ED complaining of emesis onset 6 days.  Patient notes she was at her primary care provider's office this morning for evaluation of her emesis when she was sent to the ED due to bradycardia noted on EKG in office.  Patient denies any prior history of arrhythmias.  She has associated nausea.  Has tried Coca-Cola, Zofran with relief of her symptoms.  Patient denies sick contacts.  She notes she is still able to consume broth and fluids without difficulty.  Denies chest pain, shortness of breath, fever, chills, abdominal pain, diarrhea, constipation, abdominal pain, rash, dysuria, hematuria, rhinorrhea, nasal congestion, sore throat.  Denies sick contacts.  Per patient chart review: Patient had a cardiac catheterization completed on 03/30/2021 due to unstable angina. She has a follow up appointment with her Cardiologist, Dr. Debara Pickett on 06/08/2021.   The history is provided by the patient. No language interpreter was used.         Home Medications Prior to Admission medications   Medication Sig Start Date End Date Taking? Authorizing Provider  albuterol (VENTOLIN HFA) 108 (90 Base) MCG/ACT inhaler Inhale 2 puffs into the lungs every 6 (six) hours as needed for wheezing or shortness of breath.   Yes [provider]  ALPRAZolam (XANAX) 0.5 MG tablet Take 1 tablet (0.5 mg total) by mouth 2 (two) times daily as needed for anxiety. 09/02/20  Yes Gosrani, Nimish C, MD  amLODipine (NORVASC) 10 MG tablet TAKE 1 TABLET ONCE DAILY. 02/03/21  Yes Allred, Jeneen Rinks, MD  aspirin EC 81 MG tablet Take 1 tablet (81 mg total) by mouth daily. 07/16/20  Yes Strader, Fransisco Hertz, PA-C  Cholecalciferol (DIALYVITE VITAMIN D 5000) 125 MCG  (5000 UT) capsule Take 5,000 Units by mouth daily.   Yes [provider]  clopidogrel (PLAVIX) 75 MG tablet Take 75 mg by mouth daily.   Yes [provider]  diphenhydrAMINE (BENADRYL) 25 MG tablet Take 25 mg by mouth every 6 (six) hours as needed for allergies.   Yes [provider]  escitalopram (LEXAPRO) 20 MG tablet Take 1 tablet (20 mg total) by mouth daily. 10/21/20  Yes Gosrani, Nimish C, MD  Evolocumab (REPATHA SURECLICK [place]) Inject 371 mg into the skin every 14 (fourteen) days.   Yes [provider]  fluticasone-salmeterol (ADVAIR) 100-50 MCG/ACT AEPB INHALE 1 PUFF BY MOUTH TWICE DAILY 08/25/20  Yes Gosrani, Nimish C, MD  hydrochlorothiazide (HYDRODIURIL) 12.5 MG tablet Take 12.5 mg by mouth daily.   Yes [provider]  ibuprofen (ADVIL) 200 MG tablet Take 200 mg by mouth every 6 (six) hours as needed.   Yes [provider]  isosorbide mononitrate (IMDUR) 60 MG 24 hr tablet Take 1 tablet (60 mg total) by mouth 2 (two) times daily. 03/30/21  Yes End, Harrell Gave, MD  LINZESS 145 MCG CAPS capsule Take 1 capsule (145 mcg total) by mouth daily. 10/21/20  Yes Gosrani, Nimish C, MD  metoprolol tartrate (LOPRESSOR) 25 MG tablet Take 1 tablet (25 mg total) by mouth 2 (two) times daily. 12/09/20  Yes Satira Sark, MD  nitroGLYCERIN (NITROSTAT) 0.4 MG SL tablet DISSOLVE 1 TABLET UNDER THE TONGUE AS NEEDED FOR CHEST PAIN  EVERY 5 MINUTES UP TO 3 TIMES. IF NO RELIEF CALL 911. Patient taking differently: Place 0.4 mg under the tongue every 5 (five) minutes as needed for chest pain. 09/05/20  Yes Satira Sark, MD  blood glucose meter kit and supplies KIT Use to check blood sugar daily. 11/12/20   Ailene Ards, NP  prednisoLONE acetate (PRED FORTE) 1 % ophthalmic suspension Place 1 drop into both eyes daily as needed (dry eyes/eye infection symptoms). Patient not taking: Reported on 05/06/2021    [provider]  thyroid (ARMOUR) 60 MG  tablet Take 60 mg by mouth daily before breakfast. Patient not taking: Reported on 05/06/2021    [provider]  traMADol (ULTRAM) 50 MG tablet Take 50 mg by mouth every 6 (six) hours as needed for moderate pain. Patient not taking: Reported on 05/06/2021    [provider]      Allergies    Bee venom, Lipitor [atorvastatin], Penicillins, Shellfish allergy, Crestor [rosuvastatin], Tomato, and Zofran [ondansetron]    Review of Systems   Review of Systems  Constitutional:  Negative for chills and fever.  HENT:  Negative for congestion, rhinorrhea and sore throat.   Respiratory:  Negative for shortness of breath.   Cardiovascular:  Negative for chest pain.  Gastrointestinal:  Positive for nausea and vomiting. Negative for abdominal pain, constipation and diarrhea.  Genitourinary:  Negative for dysuria and hematuria.  All other systems reviewed and are negative.  Physical Exam Updated Vital Signs BP 135/77    Pulse (!) 54    Temp 98.2 F (36.8 C) (Oral)    Resp (!) 23    Ht 5' 4.5" (1.638 m)    Wt 72.1 kg    SpO2 95%    BMI 26.87 kg/m  Physical Exam Vitals and nursing note reviewed.  Constitutional:      General: She is not in acute distress.    Appearance: She is not diaphoretic.  HENT:     Head: Normocephalic and atraumatic.     Mouth/Throat:     Pharynx: No oropharyngeal exudate.  Eyes:     General: No scleral icterus.    Conjunctiva/sclera: Conjunctivae normal.  Cardiovascular:     Rate and Rhythm: Normal rate and regular rhythm.     Pulses: Normal pulses.     Heart sounds: Normal heart sounds.  Pulmonary:     Effort: Pulmonary effort is normal. No respiratory distress.     Breath sounds: Normal breath sounds. No wheezing.  Chest:     Chest wall: No tenderness.  Abdominal:     General: Bowel sounds are normal.     Palpations: Abdomen is soft. There is no mass.     Tenderness: There is no abdominal tenderness. There is no guarding or rebound.   Musculoskeletal:        General: Normal range of motion.     Cervical back: Normal range of motion and neck supple.  Skin:    General: Skin is warm and dry.  Neurological:     Mental Status: She is alert.  Psychiatric:        Behavior: Behavior normal.    ED Results / Procedures / Treatments   Labs (all labs ordered are listed, but only abnormal results are displayed) Labs Reviewed  BASIC METABOLIC PANEL - Abnormal; Notable for the following components:      Result Value   Potassium 3.2 (*)    Glucose, Bld 172 (*)    All other components  within normal limits  CBC WITH DIFFERENTIAL/PLATELET    EKG None  Radiology No results found.  Procedures Procedures    Medications Ordered in ED Medications  sodium chloride 0.9 % bolus 1,000 mL (0 mLs Intravenous Stopped 05/06/21 1620)  metoCLOPramide (REGLAN) injection 10 mg (10 mg Intravenous Given 05/06/21 1534)    ED Course/ Medical Decision Making/ A&P Clinical Course as of 05/06/21 1653  Wed May 06, 2021  1510 Case discussed with attending who agrees with labs, IVF, ekg, and anti-emetic [SB]  1519 Patient reevaluated and noted agreeable with treatment plan. [SB]  1630 Pt reevaluated and heart rate on monitor ranging from 57-59. Discussed low potassium at 3.2 with patient and noted that she could supplement her potassium with potassium rich foods at home.  Will provide resources on this.  Patient also notes that she has antiemetics at home that she can take.  Discussed with patient to take as prescribed.  Patient agreeable to discharge treatment plan.  Pt appears well and safe for discharge at this time.  Case discussed with attending who agrees with discharge home at this time. [SB]    Clinical Course User Index [SB] Darcey Demma A, PA-C                           Medical Decision Making Amount and/or Complexity of Data Reviewed Labs: ordered.  Risk Prescription drug management.   Patient presents to the ED with  nausea and vomiting x6 days.  She was evaluated at her primary care provider's office earlier today and referred to the emergency department due to bradycardia in the office.  Patient denies chest pain, shortness of breath, abdominal pain, fever, chills.  Denies sick contacts.  Patient has a cardiologist, Dr. Debara Pickett with follow-up appointment in February 2023.  Differential diagnosis includes viral etiology, sinus bradycardia, AV block.  EKG: Sinus bradycardia with mild LVH.  No acute ST/T changes. No evidence of AV block.  Labs:  I ordered, and personally interpreted labs.  The pertinent results include: CBC without leukocytosis. BMP with mild hypokalemia at 3.2.  Otherwise unremarkable.  Medications:  I ordered medication including IV fluids and Reglan for symptomatic therapy Reevaluation of the patient after these medicines showed that the patient improved I have reviewed the patients home medicines and have made adjustments as needed  Reevaluation: After the interventions noted above, I reevaluated the patient and found that they have :improved  Cardiac Monitoring: The patient was maintained on a cardiac monitor.  I personally viewed and interpreted the cardiac monitored which showed an underlying rhythm of: Sinus bradycardia ranging from 57-59.  Disposition: Patient presentation suspicious for viral etiology of nausea and vomiting.  Patient denies new medications or sick contacts.  Patient without chest pain, shortness of breath, abdominal pain, fever, chills in the ED.  EKG without acute ST/T changes, no evidence of AV block noted on EKG.  Case discussed with attending who is agreeable with discharge home with follow-up with primary care provider.  After consideration of the diagnostic results and the patients response to treatment, I feel that the patient would benefit from discharge home with close follow-up with primary care provider.  Patient with cardiologist appointment in February  2023, advised patient to keep appointment.  Supportive care measures and strict return precautions discussed with patient at bedside. Pt acknowledges and verbalizes understanding. Pt appears safe for discharge. Follow up as indicated in discharge paperwork.   This chart was dictated  using voice recognition software, Dragon. Despite the best efforts of this provider to proofread and correct errors, errors may still occur which can change documentation meaning.  Final Clinical Impression(s) / ED Diagnoses Final diagnoses:  Nausea and vomiting, unspecified vomiting type  Hypokalemia    Rx / DC Orders ED Discharge Orders     None         Kiani Wurtzel A, PA-C 05/06/21 1653    Noemi Chapel, MD 05/16/21 2148

## 2021-05-06 NOTE — ED Notes (Signed)
Pt ambulated to the restroom.

## 2021-05-22 ENCOUNTER — Other Ambulatory Visit: Payer: Self-pay

## 2021-05-22 ENCOUNTER — Emergency Department (HOSPITAL_COMMUNITY)
Admission: EM | Admit: 2021-05-22 | Discharge: 2021-05-23 | Disposition: A | Discharge status: Home / Self Care | Payer: Medicare Other | Attending: Emergency Medicine | Admitting: Emergency Medicine

## 2021-05-22 ENCOUNTER — Emergency Department (HOSPITAL_COMMUNITY): Payer: Medicare Other

## 2021-05-22 DIAGNOSIS — R0789 Other chest pain: Secondary | ICD-10-CM | POA: Diagnosis not present

## 2021-05-22 DIAGNOSIS — I2510 Atherosclerotic heart disease of native coronary artery without angina pectoris: Secondary | ICD-10-CM | POA: Insufficient documentation

## 2021-05-22 DIAGNOSIS — Z79899 Other long term (current) drug therapy: Secondary | ICD-10-CM | POA: Diagnosis not present

## 2021-05-22 DIAGNOSIS — R079 Chest pain, unspecified: Secondary | ICD-10-CM | POA: Diagnosis not present

## 2021-05-22 DIAGNOSIS — I10 Essential (primary) hypertension: Secondary | ICD-10-CM | POA: Diagnosis not present

## 2021-05-22 DIAGNOSIS — Z7982 Long term (current) use of aspirin: Secondary | ICD-10-CM | POA: Insufficient documentation

## 2021-05-22 DIAGNOSIS — Z7902 Long term (current) use of antithrombotics/antiplatelets: Secondary | ICD-10-CM | POA: Diagnosis not present

## 2021-05-22 LAB — BASIC METABOLIC PANEL
Anion gap: 8 (ref 5–15)
BUN: 7 mg/dL (ref 6–20)
CO2: 26 mmol/L (ref 22–32)
Calcium: 9.4 mg/dL (ref 8.9–10.3)
Chloride: 105 mmol/L (ref 98–111)
Creatinine, Ser: 0.62 mg/dL (ref 0.44–1.00)
GFR, Estimated: >60 mL/min (ref >60)
Glucose, Bld: 95 mg/dL (ref 70–99)
Potassium: 3.3 mmol/L — ABNORMAL LOW (ref 3.5–5.1)
Sodium: 139 mmol/L (ref 135–145)

## 2021-05-22 LAB — POC URINE PREG, ED: Preg Test, Ur: NEGATIVE

## 2021-05-22 LAB — TROPONIN I (HIGH SENSITIVITY)
Troponin I (High Sensitivity): 4 ng/L (ref <18)
Troponin I (High Sensitivity): 4 ng/L (ref <18)

## 2021-05-22 LAB — CBC
HCT: 38.1 % (ref 36.0–46.0)
Hemoglobin: 13 g/dL (ref 12.0–15.0)
MCH: 28.6 pg (ref 26.0–34.0)
MCHC: 34.1 g/dL (ref 30.0–36.0)
MCV: 83.7 fL (ref 80.0–100.0)
Platelets: 276 10*3/uL (ref 150–400)
RBC: 4.55 MIL/uL (ref 3.87–5.11)
RDW: 14.5 % (ref 11.5–15.5)
WBC: 7.9 10*3/uL (ref 4.0–10.5)
nRBC: 0 % (ref 0.0–0.2)

## 2021-05-22 LAB — PROTIME-INR
INR: 0.8 (ref 0.8–1.2)
Prothrombin Time: 11.6 seconds (ref 11.4–15.2)

## 2021-05-22 MED ORDER — MORPHINE SULFATE (PF) 4 MG/ML IV SOLN
4.0000 mg | Freq: Once | INTRAVENOUS | Status: AC
  Administered 2021-05-22: 4 mg via INTRAVENOUS
  Filled 2021-05-22: qty 1

## 2021-05-22 NOTE — ED Triage Notes (Signed)
Chest pain that started today while laying in the bed. +nausea, +sob, +diaphoresis. While walking to triage pt had near syncopal episode. Pt reports she took 4 nitro without relief and 4 baby asa pta. Pt reports she has numerous MIs and cardiac stents. Pt to ED for EKG and workup.

## 2021-05-22 NOTE — Discharge Instructions (Signed)
I have offered you admission to the hospital but you have decided to go home, I think this is reasonable.  Your testing has not shown any signs of heart attack however if you should develop severe or worsening symptoms I would like for you to return to the emergency department immediately.  I would also like for you to see your heart doctor this week.  Again if things worsen please return to the ER

## 2021-05-22 NOTE — ED Provider Notes (Addendum)
[facility] EMERGENCY DEPARTMENT Provider Note   CSN: [id number] Arrival date & time: 05/22/21  2008     History  Chief Complaint  Patient presents with   Chest Pain    Bethany Wang is a 56 y.o. female.   Chest Pain  This patient is a 56 year old female, she has a known history of coronary disease, she has a history of being on Plavix, she also takes multiple different blood pressure medications for her hypertension.  She was treated by cardiology back in December and underwent heart catheterization for which she was recommended to have medical therapy for her recurrent diffuse obstructive coronary disease that was not amenable to any further aggressive interventions.  She has been taking her medications compliant.  She had increasing chest pain today  She reports that the pain is radiating up into her jaw her face and her arm and shoulder.  She has no shortness of breath coughing fevers or swelling of the legs.  She took multiple doses of her nitroglycerin today without any significant improvement.  She follows with Dr. Debara Pickett of the cardiology service at [facility] Medications Prior to Admission medications   Medication Sig Start Date End Date Taking? Authorizing Provider  albuterol (VENTOLIN HFA) 108 (90 Base) MCG/ACT inhaler Inhale 2 puffs into the lungs every 6 (six) hours as needed for wheezing or shortness of breath.    [provider]  ALPRAZolam Duanne Moron) 0.5 MG tablet Take 1 tablet (0.5 mg total) by mouth 2 (two) times daily as needed for anxiety. 09/02/20   Gosrani, Nimish C, MD  amLODipine (NORVASC) 10 MG tablet TAKE 1 TABLET ONCE DAILY. 02/03/21   Thompson Grayer, MD  aspirin EC 81 MG tablet Take 1 tablet (81 mg total) by mouth daily. 07/16/20   Strader, Fransisco Hertz, PA-C  blood glucose meter kit and supplies KIT Use to check blood sugar daily. 11/12/20   Ailene Ards, NP  Cholecalciferol (DIALYVITE VITAMIN D 5000) 125 MCG (5000 UT) capsule Take 5,000  Units by mouth daily.    [provider]  clopidogrel (PLAVIX) 75 MG tablet Take 75 mg by mouth daily.    [provider]  diphenhydrAMINE (BENADRYL) 25 MG tablet Take 25 mg by mouth every 6 (six) hours as needed for allergies.    [provider]  escitalopram (LEXAPRO) 20 MG tablet Take 1 tablet (20 mg total) by mouth daily. 10/21/20   [name], MD  Evolocumab (REPATHA SURECLICK [place]) Inject 810 mg into the skin every 14 (fourteen) days.    [provider]  fluticasone-salmeterol (ADVAIR) 100-50 MCG/ACT AEPB INHALE 1 PUFF BY MOUTH TWICE DAILY 08/25/20   Hurshel Party C, MD  hydrochlorothiazide (HYDRODIURIL) 12.5 MG tablet Take 12.5 mg by mouth daily.    [provider]  ibuprofen (ADVIL) 200 MG tablet Take 200 mg by mouth every 6 (six) hours as needed.    [provider]  isosorbide mononitrate (IMDUR) 60 MG 24 hr tablet Take 1 tablet (60 mg total) by mouth 2 (two) times daily. 03/30/21   End, Harrell Gave, MD  LINZESS 145 MCG CAPS capsule Take 1 capsule (145 mcg total) by mouth daily. 10/21/20   [name], MD  metoprolol tartrate (LOPRESSOR) 25 MG tablet Take 1 tablet (25 mg total) by mouth 2 (two) times daily. 12/09/20   Satira Sark, MD  nitroGLYCERIN (NITROSTAT) 0.4 MG SL tablet DISSOLVE 1 TABLET UNDER THE TONGUE AS NEEDED FOR CHEST PAIN EVERY  5 MINUTES UP TO 3 TIMES. IF NO RELIEF CALL 911. Patient taking differently: Place 0.4 mg under the tongue every 5 (five) minutes as needed for chest pain. 09/05/20   Satira Sark, MD  prednisoLONE acetate (PRED FORTE) 1 % ophthalmic suspension Place 1 drop into both eyes daily as needed (dry eyes/eye infection symptoms). Patient not taking: Reported on 05/06/2021    [provider]  thyroid (ARMOUR) 60 MG tablet Take 60 mg by mouth daily before breakfast. Patient not taking: Reported on 05/06/2021    [provider]  traMADol (ULTRAM) 50 MG tablet Take 50 mg by  mouth every 6 (six) hours as needed for moderate pain. Patient not taking: Reported on 05/06/2021    [provider]      Allergies    Bee venom, Lipitor [atorvastatin], Penicillins, Shellfish allergy, Crestor [rosuvastatin], Tomato, and Zofran [ondansetron]    Review of Systems   Review of Systems  Cardiovascular:  Positive for chest pain.  All other systems reviewed and are negative.  Physical Exam Updated Vital Signs BP (!) 168/104    Pulse (!) 57    Temp 98.9 F (37.2 C) (Oral)    Resp 15    SpO2 97%  Physical Exam Vitals and nursing note reviewed.  Constitutional:      General: She is not in acute distress.    Appearance: She is well-developed.  HENT:     Head: Normocephalic and atraumatic.     Mouth/Throat:     Pharynx: No oropharyngeal exudate.  Eyes:     General: No scleral icterus.       Right eye: No discharge.        Left eye: No discharge.     Conjunctiva/sclera: Conjunctivae normal.     Pupils: Pupils are equal, round, and reactive to light.  Neck:     Thyroid: No thyromegaly.     Vascular: No JVD.  Cardiovascular:     Rate and Rhythm: Normal rate and regular rhythm.     Heart sounds: Normal heart sounds. No murmur heard.   No friction rub. No gallop.  Pulmonary:     Effort: Pulmonary effort is normal. No respiratory distress.     Breath sounds: Normal breath sounds. No wheezing or rales.  Abdominal:     General: Bowel sounds are normal. There is no distension.     Palpations: Abdomen is soft. There is no mass.     Tenderness: There is no abdominal tenderness.  Musculoskeletal:        General: No tenderness. Normal range of motion.     Cervical back: Normal range of motion and neck supple.     Right lower leg: No edema.     Left lower leg: No edema.  Lymphadenopathy:     Cervical: No cervical adenopathy.  Skin:    General: Skin is warm and dry.     Findings: No erythema or rash.  Neurological:     General: No focal deficit present.      Mental Status: She is alert.     Coordination: Coordination normal.  Psychiatric:        Behavior: Behavior normal.    ED Results / Procedures / Treatments   Labs (all labs ordered are listed, but only abnormal results are displayed) Labs Reviewed  BASIC METABOLIC PANEL - Abnormal; Notable for the following components:      Result Value   Potassium 3.3 (*)    All other components within normal  limits  CBC  PROTIME-INR  POC URINE PREG, ED  TROPONIN I (HIGH SENSITIVITY)  TROPONIN I (HIGH SENSITIVITY)    EKG EKG Interpretation  Date/Time:  Friday May 22 2021 20:31:48 EST Ventricular Rate:  66 PR Interval:  174 QRS Duration: 100 QT Interval:  408 QTC Calculation: 427 R Axis:   66 Text Interpretation: Normal sinus rhythm Minimal voltage criteria for LVH, may be normal variant ( Sokolow-Lyon ) Borderline ECG When compared with ECG of 06-May-2021 15:29, PREVIOUS ECG IS PRESENT Confirmed by Noemi Chapel [id number]) on 05/22/2021 8:34:19 PM  Radiology DG Chest 2 View  Result Date: 05/22/2021 CLINICAL DATA:  Chest pain EXAM: CHEST - 2 VIEW COMPARISON:  02/17/2021 FINDINGS: The heart size and mediastinal contours are within normal limits. Aortic atherosclerosis. Both lungs are clear. The visualized skeletal structures are unremarkable. IMPRESSION: No active cardiopulmonary disease. Electronically Signed   By: Donavan Foil M.D.   On: 05/22/2021 21:21    Procedures Procedures    Medications Ordered in ED Medications  morphine (PF) 4 MG/ML injection 4 mg (4 mg Intravenous Given 05/22/21 2127)    ED Course/ Medical Decision Making/ A&P                           Medical Decision Making Amount and/or Complexity of Data Reviewed Labs: ordered. Radiology: ordered.  Risk Prescription drug management.   The patient is actually very well-appearing at this time but states that she is very uncomfortable.  Her EKG does not show any signs of ST elevation or depression, there is some  poor R wave progression in the anterior leads however no other acute findings.  The patient does have a history of significant coronary disease and is having increasing chest pain with nausea occasional shortness of breath and diaphoresis.  This occurs after yesterday doing some increasing yard work.  We will get a troponin, discussed with cardiology, likely needs to be admitted for rule out  This patient presents to the ED for concern of chest pain, this involves an extensive number of treatment options, and is a complaint that carries with it a high risk of complications and morbidity.  The differential diagnosis includes acute coronary syndrome, less likely to be pulmonary embolism pericarditis myocarditis pneumothorax   Co morbidities that complicate the patient evaluation  Borderline diabetic, severe hypertension, known severe multivessel coronary disease   Additional history obtained:  Additional history obtained from electronic medical record External records from outside source obtained and reviewed including heart catheterization from December 2022   Lab Tests:  I Ordered, and personally interpreted labs.  The pertinent results include: Initial troponin is unremarkable, CBC without any acute findings including no leukocytosis and no anemia, potassium is low at 3.3, this will be replaced orally   Imaging Studies ordered:  I ordered imaging studies including chest x-ray, 2 view I independently visualized and interpreted imaging which showed no acute findings, no pneumonia, no pneumothorax I agree with the radiologist interpretation   Cardiac Monitoring:  The patient was maintained on a cardiac monitor.  I personally viewed and interpreted the cardiac monitored which showed an underlying rhythm of: Normal sinus rhythm, heart rate around 60 bpm, hypertension present but improved   Medicines ordered and prescription drug management:  I ordered medication including morphine  for chest pain, patient has already had anticoagulants and nitroglycerin today Reevaluation of the patient after these medicines showed that the patient improved I have reviewed the patients  home medicines and have made adjustments as needed   Test Considered:  Second troponin ordered, CT scan considered but does not seem to be necessary at this time, patient low risk for pulmonary embolism   Critical Interventions:  Evaluation of chest pain consideration of multiple pathologic causes   Problem List / ED Course:  Morphine seem to help the pain   Reevaluation:  After the interventions noted above, I reevaluated the patient and found that they have : Improved   Social Determinants of Health:  None   Dispostion:  I had a long discussion with the patient about the risk benefits and alternatives of going home, she states that she wants to go home, she feels like she has pulled a muscle after doing all the yard work yesterday, her EKG is unremarkable, troponin is negative, second opponent is pending but she states that she really wants to go home.  She is aware of the indications for return and agreeable to return should symptoms change or should she change her mind.         Final Clinical Impression(s) / ED Diagnoses Final diagnoses:  Chest pain, unspecified type    Rx / DC Orders ED Discharge Orders     None         Noemi Chapel, MD 05/22/21 2313    Noemi Chapel, MD 05/23/21 1010

## 2021-05-22 NOTE — ED Notes (Signed)
Pt care taken, having a headache that made her feel like she was going to pass out

## 2021-05-22 NOTE — ED Provider Notes (Incomplete Revision)
[facility] EMERGENCY DEPARTMENT Provider Note   CSN: [id number] Arrival date & time: 05/22/21  2008     History  Chief Complaint  Patient presents with   Chest Pain    Bethany Wang is a 56 y.o. female.   Chest Pain  This patient is a 56 year old female, she has a known history of coronary disease, she has a history of being on Plavix, she also takes multiple different blood pressure medications for her hypertension.  She was treated by cardiology back in December and underwent heart catheterization for which she was recommended to have medical therapy for her recurrent diffuse obstructive coronary disease that was not amenable to any further aggressive interventions.  She has been taking her medications compliant.  She had increasing chest pain today  She reports that the pain is radiating up into her jaw her face and her arm and shoulder.  She has no shortness of breath coughing fevers or swelling of the legs.  She took multiple doses of her nitroglycerin today without any significant improvement.  She follows with Dr. Debara Pickett of the cardiology service at [facility] Medications Prior to Admission medications   Medication Sig Start Date End Date Taking? Authorizing Provider  albuterol (VENTOLIN HFA) 108 (90 Base) MCG/ACT inhaler Inhale 2 puffs into the lungs every 6 (six) hours as needed for wheezing or shortness of breath.    [provider]  ALPRAZolam Duanne Moron) 0.5 MG tablet Take 1 tablet (0.5 mg total) by mouth 2 (two) times daily as needed for anxiety. 09/02/20   Gosrani, Nimish C, MD  amLODipine (NORVASC) 10 MG tablet TAKE 1 TABLET ONCE DAILY. 02/03/21   Thompson Grayer, MD  aspirin EC 81 MG tablet Take 1 tablet (81 mg total) by mouth daily. 07/16/20   Strader, Fransisco Hertz, PA-C  blood glucose meter kit and supplies KIT Use to check blood sugar daily. 11/12/20   Ailene Ards, NP  Cholecalciferol (DIALYVITE VITAMIN D 5000) 125 MCG (5000 UT) capsule Take 5,000  Units by mouth daily.    [provider]  clopidogrel (PLAVIX) 75 MG tablet Take 75 mg by mouth daily.    [provider]  diphenhydrAMINE (BENADRYL) 25 MG tablet Take 25 mg by mouth every 6 (six) hours as needed for allergies.    [provider]  escitalopram (LEXAPRO) 20 MG tablet Take 1 tablet (20 mg total) by mouth daily. 10/21/20   [name], MD  Evolocumab (REPATHA SURECLICK [place]) Inject 510 mg into the skin every 14 (fourteen) days.    [provider]  fluticasone-salmeterol (ADVAIR) 100-50 MCG/ACT AEPB INHALE 1 PUFF BY MOUTH TWICE DAILY 08/25/20   Hurshel Party C, MD  hydrochlorothiazide (HYDRODIURIL) 12.5 MG tablet Take 12.5 mg by mouth daily.    [provider]  ibuprofen (ADVIL) 200 MG tablet Take 200 mg by mouth every 6 (six) hours as needed.    [provider]  isosorbide mononitrate (IMDUR) 60 MG 24 hr tablet Take 1 tablet (60 mg total) by mouth 2 (two) times daily. 03/30/21   End, Harrell Gave, MD  LINZESS 145 MCG CAPS capsule Take 1 capsule (145 mcg total) by mouth daily. 10/21/20   [name], MD  metoprolol tartrate (LOPRESSOR) 25 MG tablet Take 1 tablet (25 mg total) by mouth 2 (two) times daily. 12/09/20   Satira Sark, MD  nitroGLYCERIN (NITROSTAT) 0.4 MG SL tablet DISSOLVE 1 TABLET UNDER THE TONGUE AS NEEDED FOR CHEST PAIN EVERY  5 MINUTES UP TO 3 TIMES. IF NO RELIEF CALL 911. Patient taking differently: Place 0.4 mg under the tongue every 5 (five) minutes as needed for chest pain. 09/05/20   Satira Sark, MD  prednisoLONE acetate (PRED FORTE) 1 % ophthalmic suspension Place 1 drop into both eyes daily as needed (dry eyes/eye infection symptoms). Patient not taking: Reported on 05/06/2021    [provider]  thyroid (ARMOUR) 60 MG tablet Take 60 mg by mouth daily before breakfast. Patient not taking: Reported on 05/06/2021    [provider]  traMADol (ULTRAM) 50 MG tablet Take 50 mg by  mouth every 6 (six) hours as needed for moderate pain. Patient not taking: Reported on 05/06/2021    [provider]      Allergies    Bee venom, Lipitor [atorvastatin], Penicillins, Shellfish allergy, Crestor [rosuvastatin], Tomato, and Zofran [ondansetron]    Review of Systems   Review of Systems  Cardiovascular:  Positive for chest pain.  All other systems reviewed and are negative.  Physical Exam Updated Vital Signs BP (!) 168/104    Pulse (!) 57    Temp 98.9 F (37.2 C) (Oral)    Resp 15    SpO2 97%  Physical Exam Vitals and nursing note reviewed.  Constitutional:      General: She is not in acute distress.    Appearance: She is well-developed.  HENT:     Head: Normocephalic and atraumatic.     Mouth/Throat:     Pharynx: No oropharyngeal exudate.  Eyes:     General: No scleral icterus.       Right eye: No discharge.        Left eye: No discharge.     Conjunctiva/sclera: Conjunctivae normal.     Pupils: Pupils are equal, round, and reactive to light.  Neck:     Thyroid: No thyromegaly.     Vascular: No JVD.  Cardiovascular:     Rate and Rhythm: Normal rate and regular rhythm.     Heart sounds: Normal heart sounds. No murmur heard.   No friction rub. No gallop.  Pulmonary:     Effort: Pulmonary effort is normal. No respiratory distress.     Breath sounds: Normal breath sounds. No wheezing or rales.  Abdominal:     General: Bowel sounds are normal. There is no distension.     Palpations: Abdomen is soft. There is no mass.     Tenderness: There is no abdominal tenderness.  Musculoskeletal:        General: No tenderness. Normal range of motion.     Cervical back: Normal range of motion and neck supple.     Right lower leg: No edema.     Left lower leg: No edema.  Lymphadenopathy:     Cervical: No cervical adenopathy.  Skin:    General: Skin is warm and dry.     Findings: No erythema or rash.  Neurological:     General: No focal deficit present.      Mental Status: She is alert.     Coordination: Coordination normal.  Psychiatric:        Behavior: Behavior normal.    ED Results / Procedures / Treatments   Labs (all labs ordered are listed, but only abnormal results are displayed) Labs Reviewed  BASIC METABOLIC PANEL - Abnormal; Notable for the following components:      Result Value   Potassium 3.3 (*)    All other components within normal  limits  CBC  PROTIME-INR  POC URINE PREG, ED  TROPONIN I (HIGH SENSITIVITY)  TROPONIN I (HIGH SENSITIVITY)    EKG EKG Interpretation  Date/Time:  Friday May 22 2021 20:31:48 EST Ventricular Rate:  66 PR Interval:  174 QRS Duration: 100 QT Interval:  408 QTC Calculation: 427 R Axis:   66 Text Interpretation: Normal sinus rhythm Minimal voltage criteria for LVH, may be normal variant ( Sokolow-Lyon ) Borderline ECG When compared with ECG of 06-May-2021 15:29, PREVIOUS ECG IS PRESENT Confirmed by Noemi Chapel [phone]) on 05/22/2021 8:34:19 PM  Radiology DG Chest 2 View  Result Date: 05/22/2021 CLINICAL DATA:  Chest pain EXAM: CHEST - 2 VIEW COMPARISON:  02/17/2021 FINDINGS: The heart size and mediastinal contours are within normal limits. Aortic atherosclerosis. Both lungs are clear. The visualized skeletal structures are unremarkable. IMPRESSION: No active cardiopulmonary disease. Electronically Signed   By: Donavan Foil M.D.   On: 05/22/2021 21:21    Procedures Procedures    Medications Ordered in ED Medications  morphine (PF) 4 MG/ML injection 4 mg (4 mg Intravenous Given 05/22/21 2127)    ED Course/ Medical Decision Making/ A&P                           Medical Decision Making Amount and/or Complexity of Data Reviewed Labs: ordered. Radiology: ordered.  Risk Prescription drug management.   The patient is actually very well-appearing at this time but states that she is very uncomfortable.  Her EKG does not show any signs of ST elevation or depression, there is some  poor R wave progression in the anterior leads however no other acute findings.  The patient does have a history of significant coronary disease and is having increasing chest pain with nausea occasional shortness of breath and diaphoresis.  This occurs after yesterday doing some increasing yard work.  We will get a troponin, discussed with cardiology, likely needs to be admitted for rule out  This patient presents to the ED for concern of chest pain, this involves an extensive number of treatment options, and is a complaint that carries with it a high risk of complications and morbidity.  The differential diagnosis includes acute coronary syndrome, less likely to be pulmonary embolism pericarditis myocarditis pneumothorax   Co morbidities that complicate the patient evaluation  Borderline diabetic, severe hypertension, known severe multivessel coronary disease   Additional history obtained:  Additional history obtained from electronic medical record External records from outside source obtained and reviewed including heart catheterization from December 2022   Lab Tests:  I Ordered, and personally interpreted labs.  The pertinent results include: Initial troponin is unremarkable, CBC without any acute findings including no leukocytosis and no anemia, potassium is low at 3.3, this will be replaced orally   Imaging Studies ordered:  I ordered imaging studies including chest x-ray, 2 view I independently visualized and interpreted imaging which showed no acute findings, no pneumonia, no pneumothorax I agree with the radiologist interpretation   Cardiac Monitoring:  The patient was maintained on a cardiac monitor.  I personally viewed and interpreted the cardiac monitored which showed an underlying rhythm of: Normal sinus rhythm, heart rate around 60 bpm, hypertension present but improved   Medicines ordered and prescription drug management:  I ordered medication including morphine  for chest pain, patient has already had anticoagulants and nitroglycerin today Reevaluation of the patient after these medicines showed that the patient improved I have reviewed the patients  home medicines and have made adjustments as needed   Test Considered:  Second troponin ordered, CT scan considered but does not seem to be necessary at this time, patient low risk for pulmonary embolism   Critical Interventions:  Evaluation of chest pain consideration of multiple pathologic causes   Problem List / ED Course:  Morphine seem to help the pain   Reevaluation:  After the interventions noted above, I reevaluated the patient and found that they have : Improved   Social Determinants of Health:  None   Dispostion:  I had a long discussion with the patient about the risk benefits and alternatives of going home, she states that she wants to go home, she feels like she has pulled a muscle after doing all the yard work yesterday, her EKG is unremarkable, troponin is negative, second opponent is pending but she states that she really wants to go home.  She is aware of the indications for return and agreeable to return should symptoms change or should she change her mind.         Final Clinical Impression(s) / ED Diagnoses Final diagnoses:  Chest pain, unspecified type    Rx / DC Orders ED Discharge Orders     None         Noemi Chapel, MD 05/22/21 2313

## 2021-06-08 ENCOUNTER — Other Ambulatory Visit: Payer: Self-pay

## 2021-06-08 ENCOUNTER — Encounter: Payer: Self-pay | Admitting: Internal Medicine

## 2021-06-08 ENCOUNTER — Ambulatory Visit (INDEPENDENT_AMBULATORY_CARE_PROVIDER_SITE_OTHER): Payer: Medicare Other | Admitting: Internal Medicine

## 2021-06-08 ENCOUNTER — Other Ambulatory Visit (HOSPITAL_COMMUNITY)
Admission: RE | Admit: 2021-06-08 | Discharge: 2021-06-08 | Disposition: A | Discharge status: Home / Self Care | Payer: Medicare Other | Source: Ambulatory Visit | Attending: Internal Medicine | Admitting: Internal Medicine

## 2021-06-08 VITALS — BP 150/94 | HR 73 | Ht 64.0 in | Wt 159.0 lb

## 2021-06-08 DIAGNOSIS — Z888 Allergy status to other drugs, medicaments and biological substances status: Secondary | ICD-10-CM

## 2021-06-08 DIAGNOSIS — I25119 Atherosclerotic heart disease of native coronary artery with unspecified angina pectoris: Secondary | ICD-10-CM

## 2021-06-08 DIAGNOSIS — E782 Mixed hyperlipidemia: Secondary | ICD-10-CM | POA: Diagnosis not present

## 2021-06-08 DIAGNOSIS — I200 Unstable angina: Secondary | ICD-10-CM | POA: Diagnosis not present

## 2021-06-08 LAB — LIPID PANEL
Cholesterol: 123 mg/dL (ref 0–200)
HDL: 52 mg/dL (ref >40)
LDL Cholesterol: 57 mg/dL (ref 0–99)
Total CHOL/HDL Ratio: 2.4 RATIO
Triglycerides: 72 mg/dL (ref <150)
VLDL: 14 mg/dL (ref 0–40)

## 2021-06-08 MED ORDER — RANOLAZINE ER 500 MG PO TB12: 500.0000 mg | ORAL_TABLET | Freq: Two times a day (BID) | ORAL | 11 refills | Status: DC

## 2021-06-08 MED ORDER — VALSARTAN 160 MG PO TABS: 160.0000 mg | ORAL_TABLET | Freq: Every day | ORAL | 11 refills | Status: DC

## 2021-06-08 NOTE — Patient Instructions (Signed)
Medication Instructions:  START Valsartan 160 mg daily for blood pressure   START Ranexa 500 mg twice a day for angina   Labwork: Fasting Lipids today  Testing/Procedures: None today  Follow-Up: 3 months  Any Other Special Instructions Will Be Listed Below (If Applicable).  If you need a refill on your cardiac medications before your next appointment, please call your pharmacy.

## 2021-06-08 NOTE — Progress Notes (Addendum)
LIPID CLINIC CONSULT NOTE  Chief Complaint:  Recurrent chest pain  Primary Care Physician: Orchard Nation, MD  Primary Cardiologist:  Pixie Casino, MD  HPI:  Bethany Wang is a 56 y.o. female who is being seen today for the evaluation of dyslipidemia at the request of Bethany Nation, MD. This is a pleasant 56 year old female that I do not recall seeing in the hospital but she remembers seeing me with a recent hospitalization.  She was hospitalized in July with chest pain and underwent left heart catheterization.  This demonstrated occlusion of the right coronary artery with the previously placed coronary stent and left to right collaterals.  There was a 50% in-stent restenosis of the proximal LAD and 90% diagonal ostial narrowing.  There is also 60 to 70% obtuse marginal narrowing and 50% distal circumflex disease.  Medical therapy was recommended.  She was referred today for management of dyslipidemia.  Unfortunately she was intolerant of statins including atorvastatin and rosuvastatin, both of which caused significant side effects.  Subsequently she had been switched by her primary care provider to [place] and has already had a dose of the medication.  It is too early to look for response to this medication however I agree with this therapy.  More concerning, however today is symptoms of progressive chest pain.  She reports that ever since this past summer she has had worsening chest pain despite long-acting nitroglycerin.  She has been using short acting nitroglycerin very frequently and gets some minimal relief however it is short-lived.  EKG today shows normal sinus rhythm with some nonspecific T wave changes.  06/08/2021  Bethany Wang returns today for recurrent chest pain.  She has been in the emergency department twice this year including the other week with chest pain.  She said she had an episode when she was at church including diaphoresis, shortness of breath and left  arm pain.  She took nitroglycerin and it took several minutes to up to an hour to improve.  She also notes she gets short of breath and has chest discomfort when exerting herself.  She says she gets chest discomfort working in her garden.  I had sent her for cardiac catheterization in December 2022 for symptoms concerning for unstable angina.  Demonstrated moderate to severe multivessel coronary disease, similar to prior cath in July 2022 however FFR was negative of the mid LAD stent in OM1 lesion and a chronic total occlusion of the proximal RCA stent was again noted.  LVEDP was mildly elevated.  More antianginal therapy was recommended.  Initially she was switched from isosorbide 30 mg 3 times daily to 60 mg twice daily.  She says initially this helped somewhat but since then her symptoms have worsened.  She is now taking sublingual nitro almost on a daily basis.  Both of her ER admissions, however have not demonstrated any troponinemia.  PMHx:  Past Medical History:  Diagnosis Date   Anemia    Anxiety    Bilateral breast cysts 12/30/2015   Brain tumor ([place])    Breast cancer ([place])    Left Breast Cancer   Breast disorder    BV (bacterial vaginosis) 09/09/2015   Coronary atherosclerosis of native coronary artery    a. s/p DES to RCA in 2013 b. DES to LAD in 2014 c. cath in 04/2016 showing patent LAD stent with D2 jailed by LAD stent and CTO of RCA with left to right collaterals present   Cyst of  pharynx or nasopharynx    [name]   Decreased libido 12/25/2018   Depression    Diabetes mellitus without complication ([place] [facility])    Essential hypertension    Falls    x 3-4 in past year   GERD (gastroesophageal reflux disease)    Headache    Heart attack ([place] [facility])    x4   History of hematuria    Mixed hyperlipidemia    Personal history of chemotherapy    Left Breast Cancer   Personal history of radiation therapy    Left Breast Cancer   Pre-diabetes    PUD (peptic ulcer disease)     Seizures ([place])    Shingles 09/09/2015   Stroke (Bethany Wang)    12-2017 on Plavix, only deficit is headaches   Suicide attempt ([place] [facility])    3 attempts in remote past   Trichimoniasis 08/20/2019   Treated 08/20/19    Uterine cancer ([place] [facility])    Vitamin D deficiency disease 04/03/2019    Past Surgical History:  Procedure Laterality Date   ABDOMINAL HYSTERECTOMY     BIOPSY  11/28/2019   Procedure: BIOPSY;  Surgeon: Rogene Houston, MD;  [facility];  Service: Endoscopy;;  antral, gastric body    BLADDER SURGERY     BREAST LUMPECTOMY Left    BREAST LUMPECTOMY Right 02/24/2018   Procedure: RIGHT BREAST LUMPECTOMY ERAS PATHWAY;  Surgeon: Jovita Kussmaul, MD;  Location: [facility];  Service: General;  Laterality: Right;   CARDIAC CATHETERIZATION N/A 05/10/2016   Procedure: Left Heart Cath and Coronary Angiography;  Surgeon: Belva Crome, MD;  Location: [facility];  Service: Cardiovascular;  Laterality: N/A;   CAUTERIZE INNER NOSE  07/13/2020   COLONOSCOPY  May 2010   [place]man: normal rectum, internal hemorrhoids, , benign colonic polyp   COLONOSCOPY N/A 01/05/2017   Procedure: COLONOSCOPY;  Surgeon: Rogene Houston, MD;  [facility];  Service: Endoscopy;  Laterality: N/A;  1:00   ESOPHAGEAL DILATION  11/28/2019   Procedure: ESOPHAGEAL DILATION;  Surgeon: Rogene Houston, MD;  [facility];  Service: Endoscopy;;   ESOPHAGOGASTRODUODENOSCOPY     2001 Dr. Amedeo Plenty: distal esophagitis, small hiatal hernia,. Dr. Tamala Julian 2006? no records available currently, pt also reports  EGD a few years ago with Dr. Gala Romney, do not have these reports anywhere in medical records   ESOPHAGOGASTRODUODENOSCOPY  06/02/2011   PIR:JJOACZ pill impaction as described above s/p dilation of a probable cervical esophageal web/bx abnormal esophageal and gastric mucosa. + H.pylori gastritis    ESOPHAGOGASTRODUODENOSCOPY (EGD) WITH PROPOFOL N/A 11/28/2019   Procedure: ESOPHAGOGASTRODUODENOSCOPY (EGD) WITH  PROPOFOL;  Surgeon: Rogene Houston, MD;  [facility];  Service: Endoscopy;  Laterality: N/A;  1020   EYE SURGERY     Removed glass   HAND SURGERY Right    INTRAVASCULAR PRESSURE WIRE/FFR STUDY N/A 10/23/2020   Procedure: INTRAVASCULAR PRESSURE WIRE/FFR STUDY;  Surgeon: Belva Crome, MD;  Location: [facility];  Service: Cardiovascular;  Laterality: N/A;   INTRAVASCULAR PRESSURE WIRE/FFR STUDY N/A 03/30/2021   Procedure: INTRAVASCULAR PRESSURE WIRE/FFR STUDY;  Surgeon: Nelva Bush, MD;  Location: [facility];  Service: Cardiovascular;  Laterality: N/A;   Left breast lumpectomy     Benign   LEFT HEART CATH AND CORONARY ANGIOGRAPHY N/A 10/25/2018   Procedure: LEFT HEART CATH AND CORONARY ANGIOGRAPHY;  Surgeon: Wellington Hampshire, MD;  Location: [facility];  Service: Cardiovascular;  Laterality: N/A;  LEFT HEART CATH AND CORONARY ANGIOGRAPHY N/A 10/23/2020   Procedure: LEFT HEART CATH AND CORONARY ANGIOGRAPHY;  Surgeon: Belva Crome, MD;  Location: [facility];  Service: Cardiovascular;  Laterality: N/A;   LEFT HEART CATH AND CORONARY ANGIOGRAPHY N/A 03/30/2021   Procedure: LEFT HEART CATH AND CORONARY ANGIOGRAPHY;  Surgeon: Nelva Bush, MD;  Location: [facility];  Service: Cardiovascular;  Laterality: N/A;   LEFT HEART CATHETERIZATION WITH CORONARY ANGIOGRAM N/A 07/03/2014   Procedure: LEFT HEART CATHETERIZATION WITH CORONARY ANGIOGRAM;  Surgeon: Belva Crome, MD;  Location: [facility];  Service: Cardiovascular;  Laterality: N/A;   POLYPECTOMY  01/05/2017   Procedure: POLYPECTOMY;  Surgeon: Rogene Houston, MD;  [facility];  Service: Endoscopy;;  colon   SHOULDER SURGERY Left     FAMHx:  Family History  Problem Relation Age of Onset   Colon cancer Paternal Grandfather    Cancer Father    Cancer Maternal Uncle    Alzheimer's disease Paternal Aunt    Cirrhosis Maternal Uncle    Cancer Paternal Aunt        lung    Aneurysm Mother        brain   Heart disease Brother    Heart attack Brother    Mental illness Daughter    ADD / ADHD Daughter    Bipolar disorder Daughter    Mental illness Son    ADD / ADHD Son    Bipolar disorder Son    Mental illness Son    ADD / ADHD Son    Bipolar disorder Son     SOCHx:   reports that she quit smoking about 9 years ago. Her smoking use included cigarettes and cigars. She started smoking about 37 years ago. She has a 6.40 pack-year smoking history. She has never used smokeless tobacco. She reports that she does not drink alcohol and does not use drugs.  ALLERGIES:  Allergies  Allergen Reactions   Bee Venom Anaphylaxis and Other (See Comments)    "throat swelled and had to the hospital" - Yellow jacket sting   Lipitor [Atorvastatin] Rash and Other (See Comments)    Liver damage & Upper Abdominal pain, Break out like a rash . Per last hosp visit. It exposed on inpatient stay.   Penicillins Anaphylaxis and Other (See Comments)    Has patient had a PCN reaction causing immediate rash, facial/tongue/throat swelling, SOB or lightheadedness with hypotension: Yes Has patient had a PCN reaction causing severe rash involving mucus membranes or skin necrosis: Yes Has patient had a PCN reaction that required hospitalization Yes Has patient had a PCN reaction occurring within the last 10 years: Yes If all of the above answers are "NO", then may proceed with Cephalosporin use. Patient states she carries an epi-pen   Shellfish Allergy Swelling   Crestor [Rosuvastatin]     Upper Abdominal pain, Break out like a rash   Tomato Itching and Swelling   Zofran [Ondansetron] Itching    ROS: Pertinent items noted in HPI and remainder of comprehensive ROS otherwise negative.  HOME MEDS: Current Outpatient Medications on File Prior to Visit  Medication Sig Dispense Refill   albuterol (VENTOLIN HFA) 108 (90 Base) MCG/ACT inhaler Inhale 2 puffs into the lungs every 6 (six)  hours as needed for wheezing or shortness of breath.     ALPRAZolam (XANAX) 0.5 MG tablet Take 1 tablet (0.5 mg total) by mouth 2 (two) times daily as needed for anxiety.  30 tablet 0   amLODipine (NORVASC) 10 MG tablet TAKE 1 TABLET ONCE DAILY. 90 tablet 3   aspirin EC 81 MG tablet Take 1 tablet (81 mg total) by mouth daily. 180 tablet 1   blood glucose meter kit and supplies KIT Use to check blood sugar daily. 1 each 0   Cholecalciferol (DIALYVITE VITAMIN D 5000) 125 MCG (5000 UT) capsule Take 5,000 Units by mouth daily.     clopidogrel (PLAVIX) 75 MG tablet Take 75 mg by mouth daily.     diphenhydrAMINE (BENADRYL) 25 MG tablet Take 25 mg by mouth every 6 (six) hours as needed for allergies.     escitalopram (LEXAPRO) 20 MG tablet Take 1 tablet (20 mg total) by mouth daily. 30 tablet 3   Evolocumab (REPATHA SURECLICK [place]) Inject 161 mg into the skin every 14 (fourteen) days.     fluticasone-salmeterol (ADVAIR) 100-50 MCG/ACT AEPB INHALE 1 PUFF BY MOUTH TWICE DAILY 60 each 10   hydrochlorothiazide (HYDRODIURIL) 12.5 MG tablet Take 12.5 mg by mouth daily.     ibuprofen (ADVIL) 200 MG tablet Take 200 mg by mouth every 6 (six) hours as needed.     isosorbide mononitrate (IMDUR) 60 MG 24 hr tablet Take 1 tablet (60 mg total) by mouth 2 (two) times daily. 60 tablet 5   LINZESS 145 MCG CAPS capsule Take 1 capsule (145 mcg total) by mouth daily. 30 capsule 3   metoprolol tartrate (LOPRESSOR) 25 MG tablet Take 1 tablet (25 mg total) by mouth 2 (two) times daily. 180 tablet 3   nitroGLYCERIN (NITROSTAT) 0.4 MG SL tablet DISSOLVE 1 TABLET UNDER THE TONGUE AS NEEDED FOR CHEST PAIN EVERY 5 MINUTES UP TO 3 TIMES. IF NO RELIEF CALL 911. (Patient taking differently: Place 0.4 mg under the tongue every 5 (five) minutes as needed for chest pain.) 25 tablet 1   prednisoLONE acetate (PRED FORTE) 1 % ophthalmic suspension Place 1 drop into both eyes daily as needed (dry eyes/eye infection symptoms).     thyroid  (ARMOUR) 60 MG tablet Take 60 mg by mouth daily before breakfast.     traMADol (ULTRAM) 50 MG tablet Take 50 mg by mouth every 6 (six) hours as needed for moderate pain.     Current Facility-Administered Medications on File Prior to Visit  Medication Dose Route Frequency Provider Last Rate Last Admin   NONFORMULARY OR COMPOUNDED ITEM 0.2 mL  0.2 mL Intramuscular Q7 days [name], Nimish C, MD   0.2 mL at 10/23/19 1002    LABS/IMAGING: No results found for this or any previous visit (from the past 48 hour(s)). No results found.  LIPID PANEL:    Component Value Date/Time   CHOL 216 (H) 12/29/2020 1518   TRIG 86 12/29/2020 1518   HDL 50 12/29/2020 1518   CHOLHDL 4.3 12/29/2020 1518   VLDL 17 12/29/2020 1518   LDLCALC 149 (H) 12/29/2020 1518   LDLCALC 146 (H) 07/03/2020 1334    WEIGHTS: Wt Readings from Last 3 Encounters:  06/08/21 159 lb (72.1 kg)  05/06/21 159 lb (72.1 kg)  03/30/21 157 lb (71.2 kg)    VITALS: BP (!) 150/94   Pulse 73   Ht $R'5\' 4"'oy$  (1.626 m)   Wt 159 lb (72.1 kg)   SpO2 96%   BMI 27.29 kg/m   EXAM: General appearance: alert and no distress Neck: no carotid bruit, no JVD, and thyroid not enlarged, symmetric, no tenderness/mass/nodules Lungs: clear to auscultation bilaterally Heart: regular rate and rhythm,  S1, S2 normal, no murmur, click, rub or gallop Abdomen: soft, non-tender; bowel sounds normal; no masses,  no organomegaly Extremities: extremities normal, atraumatic, no cyanosis or edema Pulses: 2+ and symmetric Skin: Skin color, texture, turgor normal. No rashes or lesions Neurologic: Grossly normal Psych: Pleasant  EKG: Deferred (hospital EKG's personally reviewed)  ASSESSMENT: Chronic stable angina Coronary artery disease with prior PCI and occluded right coronary with left-to-right collaterals, 50% diffuse LAD stenosis (10/2020) -repeat cardiac catheterization 03/2021, unchanged, medical therapy recommended Mixed dyslipidemia, goal LDL less  than 70 Statin allergic - hives, now on Repatha  PLAN: 1.   Ms. Tensley has some degree of chronic stable angina which waxes and wanes and has had several episodes recently requiring more nitroglycerin.  Repeat catheterization demonstrated no significant change in her anatomy and medical therapy was recommended.  Initially she benefited some from increases in her nitrate however now takes nitroglycerin almost daily.  Blood pressure was noted to be elevated somewhat.  This could be an issue especially since her EDP was elevated at cath.  Would advise starting irbesartan 160 mg daily.  In addition we will start Ranexa 500 mg twice daily.  Continue her current nitrate.  We will plan to continue with this aggressive medical therapy.  She is now on Repatha but has not had her lipids reassessed.  She is fasting today and we will check a lipid profile.  Hopefully her cholesterol is also at goal.  If she continues to fail aggressive medical therapy, we will have no other options other than to repeat cardiac catheterization.  Plan follow-up with me in 3 months or sooner if necessary  Pixie Casino, MD, [facility], [place] Director of the [facility] Lipid Disorders &   Cardiovascular Risk Reduction Clinic Diplomate of the American Board of Clinical Lipidology Attending Cardiologist  Direct Dial: [phone]  Fax: [phone]  Website:  [email or web address] 06/08/2021, 9:27 AM

## 2021-06-10 DIAGNOSIS — E039 Hypothyroidism, unspecified: Secondary | ICD-10-CM | POA: Diagnosis not present

## 2021-06-10 DIAGNOSIS — G720 Drug-induced myopathy: Secondary | ICD-10-CM | POA: Diagnosis not present

## 2021-06-10 DIAGNOSIS — R112 Nausea with vomiting, unspecified: Secondary | ICD-10-CM | POA: Diagnosis not present

## 2021-06-10 DIAGNOSIS — E785 Hyperlipidemia, unspecified: Secondary | ICD-10-CM | POA: Diagnosis not present

## 2021-06-10 DIAGNOSIS — I10 Essential (primary) hypertension: Secondary | ICD-10-CM | POA: Diagnosis not present

## 2021-06-10 DIAGNOSIS — I2510 Atherosclerotic heart disease of native coronary artery without angina pectoris: Secondary | ICD-10-CM | POA: Diagnosis not present

## 2021-06-10 DIAGNOSIS — T466X5A Adverse effect of antihyperlipidemic and antiarteriosclerotic drugs, initial encounter: Secondary | ICD-10-CM | POA: Diagnosis not present

## 2021-06-10 DIAGNOSIS — R109 Unspecified abdominal pain: Secondary | ICD-10-CM | POA: Diagnosis not present

## 2021-06-11 ENCOUNTER — Telehealth: Payer: Self-pay | Admitting: Internal Medicine

## 2021-06-11 NOTE — Telephone Encounter (Signed)
Patient started both ranexa and valsartan on same day. She has topped both,she already has an rx for benadryl. ? ? ? ?I will [place] PharmD and Dr.Hilty ?

## 2021-06-11 NOTE — Telephone Encounter (Signed)
Attempt to reach patient,no answer,no voicemail set up ?

## 2021-06-11 NOTE — Telephone Encounter (Signed)
Patient walked in clinic Valsartan 160 mg daily for blood pressure ? Ranexa 500 mg twice a day for angina ?  ? Pt c/o medication issue: ? ?1. Name of Medication: pt don't know if it's the Valsartan or Ranexa ? ?2. How are you currently taking this medication (dosage and times per day)? Valsarta 160mg  once a day and, Renexa 500mg  twice a day  ? ?3. Are you having a reaction (difficulty breathing--STAT)? She is broke out with what she referred to as blisters. Itching all over. Hands swollen  ? ?4. What is your medication issue? A reaction to the medication.  ?

## 2021-06-11 NOTE — Telephone Encounter (Signed)
I spoke with patient and she will stop valsartan and try ranexa after her symptoms resolve.She will let us know ?

## 2021-06-17 ENCOUNTER — Encounter: Payer: Self-pay | Admitting: Internal Medicine

## 2021-07-27 DIAGNOSIS — I2510 Atherosclerotic heart disease of native coronary artery without angina pectoris: Secondary | ICD-10-CM | POA: Diagnosis not present

## 2021-07-27 DIAGNOSIS — I10 Essential (primary) hypertension: Secondary | ICD-10-CM | POA: Diagnosis not present

## 2021-07-27 DIAGNOSIS — E785 Hyperlipidemia, unspecified: Secondary | ICD-10-CM | POA: Diagnosis not present

## 2021-07-27 DIAGNOSIS — R109 Unspecified abdominal pain: Secondary | ICD-10-CM | POA: Diagnosis not present

## 2021-07-27 DIAGNOSIS — E039 Hypothyroidism, unspecified: Secondary | ICD-10-CM | POA: Diagnosis not present

## 2021-07-27 DIAGNOSIS — G720 Drug-induced myopathy: Secondary | ICD-10-CM | POA: Diagnosis not present

## 2021-07-27 DIAGNOSIS — T466X5A Adverse effect of antihyperlipidemic and antiarteriosclerotic drugs, initial encounter: Secondary | ICD-10-CM | POA: Diagnosis not present

## 2021-08-04 DIAGNOSIS — E039 Hypothyroidism, unspecified: Secondary | ICD-10-CM | POA: Diagnosis not present

## 2021-08-05 DIAGNOSIS — M25559 Pain in unspecified hip: Secondary | ICD-10-CM | POA: Diagnosis not present

## 2021-08-12 DIAGNOSIS — I25119 Atherosclerotic heart disease of native coronary artery with unspecified angina pectoris: Secondary | ICD-10-CM | POA: Diagnosis not present

## 2021-08-19 DIAGNOSIS — M25559 Pain in unspecified hip: Secondary | ICD-10-CM | POA: Diagnosis not present

## 2021-08-24 ENCOUNTER — Encounter: Payer: Self-pay | Admitting: Internal Medicine

## 2021-08-24 DIAGNOSIS — I10 Essential (primary) hypertension: Secondary | ICD-10-CM | POA: Diagnosis not present

## 2021-08-24 DIAGNOSIS — I2510 Atherosclerotic heart disease of native coronary artery without angina pectoris: Secondary | ICD-10-CM | POA: Diagnosis not present

## 2021-08-24 DIAGNOSIS — T466X5A Adverse effect of antihyperlipidemic and antiarteriosclerotic drugs, initial encounter: Secondary | ICD-10-CM | POA: Diagnosis not present

## 2021-08-24 DIAGNOSIS — R109 Unspecified abdominal pain: Secondary | ICD-10-CM | POA: Diagnosis not present

## 2021-08-24 DIAGNOSIS — E785 Hyperlipidemia, unspecified: Secondary | ICD-10-CM | POA: Diagnosis not present

## 2021-08-24 DIAGNOSIS — G720 Drug-induced myopathy: Secondary | ICD-10-CM | POA: Diagnosis not present

## 2021-08-24 DIAGNOSIS — M25559 Pain in unspecified hip: Secondary | ICD-10-CM | POA: Diagnosis not present

## 2021-08-24 DIAGNOSIS — E039 Hypothyroidism, unspecified: Secondary | ICD-10-CM | POA: Diagnosis not present

## 2021-08-24 NOTE — Telephone Encounter (Signed)
Opened in error

## 2021-09-02 DIAGNOSIS — R262 Difficulty in walking, not elsewhere classified: Secondary | ICD-10-CM | POA: Diagnosis not present

## 2021-09-02 DIAGNOSIS — M6281 Muscle weakness (generalized): Secondary | ICD-10-CM | POA: Diagnosis not present

## 2021-09-02 DIAGNOSIS — M542 Cervicalgia: Secondary | ICD-10-CM | POA: Diagnosis not present

## 2021-09-02 DIAGNOSIS — M25559 Pain in unspecified hip: Secondary | ICD-10-CM | POA: Diagnosis not present

## 2021-09-03 ENCOUNTER — Telehealth: Payer: Self-pay

## 2021-09-03 ENCOUNTER — Encounter: Payer: Self-pay | Admitting: Orthopaedic Surgery

## 2021-09-03 ENCOUNTER — Ambulatory Visit (INDEPENDENT_AMBULATORY_CARE_PROVIDER_SITE_OTHER): Payer: Medicare Other | Admitting: Orthopaedic Surgery

## 2021-09-03 VITALS — Ht 64.0 in | Wt 152.0 lb

## 2021-09-03 DIAGNOSIS — M5010 Cervical disc disorder with radiculopathy, unspecified cervical region: Secondary | ICD-10-CM

## 2021-09-03 DIAGNOSIS — M7061 Trochanteric bursitis, right hip: Secondary | ICD-10-CM | POA: Diagnosis not present

## 2021-09-03 DIAGNOSIS — M542 Cervicalgia: Secondary | ICD-10-CM | POA: Diagnosis not present

## 2021-09-03 NOTE — Telephone Encounter (Signed)
Waiting on reply from nurse or provider

## 2021-09-03 NOTE — Progress Notes (Signed)
Office Visit Note   Patient: Bethany Wang           Date of Birth: [birth year]           MRN: [id number] Visit Date: 09/03/2021              Requested by: [place] Nation, MD [place],  [place] [id number] PCP: [place] Nation, MD   Assessment & Plan: Visit Diagnoses:  1. Cervical disc disorder with radiculopathy of cervical region   2. Trochanteric bursitis, right hip     Plan: Previous x-rays demonstrated changes that could contribute to femoral acetabular impingement with only mild joint space narrowing.  No calcification of the trochanteric bursa.  Right trochanteric bursa injection performed which should give her good improvement she is able to ambulate with significant improvement.  She can return if she has increasing symptoms.  Her plain radiograph did demonstrate some C6-7 disc space narrowing consistent with spondylosis.  She can return if she develops progressive radicular symptoms.  Follow-Up Instructions: Return in about 4 weeks (around 10/01/2021).   Orders:  No orders of the defined types were placed in this encounter.  No orders of the defined types were placed in this encounter.     Procedures: Large Joint Inj: R greater trochanter on 09/03/2021 11:42 AM Details: 22 G needle, lateral approach  Arthrogram: No  Medications: 1 mL lidocaine 1 %; 2 mL bupivacaine 0.25 %; 40 mg methylPREDNISolone acetate 40 MG/ML     Clinical Data: No additional findings.   Subjective: Chief Complaint  Patient presents with   Left Hip - Pain   Right Hip - Pain   Neck - Pain    HPI 56 year old female with bilateral hip pain and neck pain.  Neck pain radiates into her left shoulder.  She states she has had some popping discomfort in her hip right more than left and states her hips pop and points laterally over the trochanter.  She states she walks 3 times per day sometimes 4 to 5 miles.  She states she had some swelling in her legs.  She is seeing Dr. Bethann Goo  her chiropractor for therapy and she been asked to be cleared for treatment of her neck.  Patient quit smoking 8 years ago.  Patient has had problems with acid reflux anxiety bladder problems diabetes heart disease hypertension migraines and ulcers.  She relates history of a stroke.  History of angina.  Previous smoker.  Review of Systems negative for chills or fever.  No associated bowel or bladder symptoms.   Objective: Vital Signs: Ht '5\' 4"'$  (1.626 m)   Wt 152 lb (68.9 kg)   BMI 26.09 kg/m   Physical Exam Constitutional:      Appearance: She is well-developed.  HENT:     Head: Normocephalic.     Right Ear: External ear normal.     Left Ear: External ear normal. There is no impacted cerumen.  Eyes:     Pupils: Pupils are equal, round, and reactive to light.  Neck:     Thyroid: No thyromegaly.     Trachea: No tracheal deviation.  Cardiovascular:     Rate and Rhythm: Normal rate.  Pulmonary:     Effort: Pulmonary effort is normal.  Abdominal:     Palpations: Abdomen is soft.  Musculoskeletal:     Cervical back: No rigidity.  Skin:    General: Skin is warm and dry.  Neurological:     Mental  Status: She is alert and oriented to person, place, and time.  Psychiatric:        Behavior: Behavior normal.    Ortho Exam patient has intact reflexes negative logroll hips.  Exquisite tenderness of the right greater trochanter consistent with trochanteric bursitis.  Distal pulses are intact.  Specialty Comments:  No specialty comments available.  Imaging: No results found.   PMFS History: Patient Active Problem List   Diagnosis Date Noted   Trochanteric bursitis, right hip 09/03/2021   Encounter for screening fecal occult blood testing 01/13/2021   Encounter for well woman exam with routine gynecological exam 01/13/2021   S/P hysterectomy 01/13/2021   Urinary frequency 01/13/2021   Loss of weight 05/20/2020   Abdominal pain 05/20/2020   Trichimoniasis 08/20/2019    Screening examination for STD (sexually transmitted disease) 08/15/2019   Vaginal itching 08/15/2019   Hypertension 08/15/2019   Vitamin D deficiency disease 04/03/2019   Decreased libido 12/25/2018   Angina pectoris ([place]) 10/25/2018   Palpitations 06/06/2018   Chest pain 07/13/2017   Nonspecific chest pain 07/13/2017   Mixed hyperlipidemia 07/13/2017   Precordial chest pain    Hypertensive urgency 06/15/2016   Unstable angina pectoris ([place]) 06/15/2016   Unstable angina ([place]) 06/15/2016   Bilateral breast cysts 12/30/2015   Vaginal discharge 09/09/2015   BV (bacterial vaginosis) 09/09/2015   Shingles 09/09/2015   History of breast cancer 09/09/2015   Syncope and collapse 08/08/2015   Migraine with aura and with status migrainosus, not intractable 08/08/2015   Chronic low back pain 08/08/2015   Insomnia 08/08/2015   Anxiety state 08/08/2015   Panic attacks 08/08/2015   Anemia, iron deficiency 08/08/2015   Pica 08/08/2015   Angina decubitus ([place]) 07/03/2014   Vertebrobasilar artery syndrome 03/06/2014   Encounter for gynecological examination with Papanicolaou smear of cervix 02/04/2014   Syncope 12/14/2013   Cervical disc disorder with radiculopathy of cervical region 02/19/2013   H/O rotator cuff surgery 02/19/2013   Dyspareunia 02/01/2013   Left shoulder pain 01/24/2013   Rotator cuff tear 01/24/2013   Radicular pain 01/24/2013   Labral tear of shoulder 01/24/2013   Complex regional pain syndrome of upper extremity 01/24/2013   Herniated disc, cervical 01/24/2013   Nausea and vomiting 01/18/2013   Rectal bleeding 01/18/2013   Edema of left foot 10/16/2012   Coronary atherosclerosis of native coronary artery    Essential hypertension, benign    HLD (hyperlipidemia)    Tobacco abuse    Past Medical History:  Diagnosis Date   Anemia    Anxiety    Bilateral breast cysts 12/30/2015   Brain tumor ([place])    Breast cancer ([place])    Left Breast Cancer   Breast disorder     BV (bacterial vaginosis) 09/09/2015   Coronary atherosclerosis of native coronary artery    a. s/p DES to RCA in 2013 b. DES to LAD in 2014 c. cath in 04/2016 showing patent LAD stent with D2 jailed by LAD stent and CTO of RCA with left to right collaterals present   Cyst of pharynx or nasopharynx    [name]   Decreased libido 12/25/2018   Depression    Diabetes mellitus without complication ([place])    Essential hypertension    Falls    x 3-4 in past year   GERD (gastroesophageal reflux disease)    Headache    Heart attack ([place])    x4   History of hematuria    Mixed hyperlipidemia  Personal history of chemotherapy    Left Breast Cancer   Personal history of radiation therapy    Left Breast Cancer   Pre-diabetes    PUD (peptic ulcer disease)    Seizures ([place])    Shingles 09/09/2015   Stroke ([place])    12-2017 on Plavix, only deficit is headaches   Suicide attempt ([place])    3 attempts in remote past   Trichimoniasis 08/20/2019   Treated 08/20/19    Uterine cancer ([place])    Vitamin D deficiency disease 04/03/2019    Family History  Problem Relation Age of Onset   Colon cancer Paternal Grandfather    Cancer Father    Cancer Maternal Uncle    Alzheimer's disease Paternal Aunt    Cirrhosis Maternal Uncle    Cancer Paternal Aunt        lung   Aneurysm Mother        brain   Heart disease Brother    Heart attack Brother    Mental illness Daughter    ADD / ADHD Daughter    Bipolar disorder Daughter    Mental illness Son    ADD / ADHD Son    Bipolar disorder Son    Mental illness Son    ADD / ADHD Son    Bipolar disorder Son     Past Surgical History:  Procedure Laterality Date   ABDOMINAL HYSTERECTOMY     BIOPSY  11/28/2019   Procedure: BIOPSY;  Surgeon: Rogene Houston, MD;  [facility];  Service: Endoscopy;;  antral, gastric body    BLADDER SURGERY     BREAST LUMPECTOMY Left    BREAST LUMPECTOMY Right 02/24/2018   Procedure:  RIGHT BREAST LUMPECTOMY ERAS PATHWAY;  Surgeon: Jovita Kussmaul, MD;  Location: [facility] OR;  Service: General;  Laterality: Right;   CARDIAC CATHETERIZATION N/A 05/10/2016   Procedure: Left Heart Cath and Coronary Angiography;  Surgeon: Belva Crome, MD;  Location: [facility];  Service: Cardiovascular;  Laterality: N/A;   CAUTERIZE INNER NOSE  07/13/2020   COLONOSCOPY  May 2010   Fleishman: normal rectum, internal hemorrhoids, , benign colonic polyp   COLONOSCOPY N/A 01/05/2017   Procedure: COLONOSCOPY;  Surgeon: Rogene Houston, MD;  [facility];  Service: Endoscopy;  Laterality: N/A;  1:00   ESOPHAGEAL DILATION  11/28/2019   Procedure: ESOPHAGEAL DILATION;  Surgeon: Rogene Houston, MD;  [facility];  Service: Endoscopy;;   ESOPHAGOGASTRODUODENOSCOPY     2001 Dr. Amedeo Plenty: distal esophagitis, small hiatal hernia,. Dr. Tamala Julian 2006? no records available currently, pt also reports  EGD a few years ago with Dr. Gala Romney, do not have these reports anywhere in medical records   ESOPHAGOGASTRODUODENOSCOPY  06/02/2011   HQI:ONGEXB pill impaction as described above s/p dilation of a probable cervical esophageal web/bx abnormal esophageal and gastric mucosa. + H.pylori gastritis    ESOPHAGOGASTRODUODENOSCOPY (EGD) WITH PROPOFOL N/A 11/28/2019   Procedure: ESOPHAGOGASTRODUODENOSCOPY (EGD) WITH PROPOFOL;  Surgeon: Rogene Houston, MD;  [facility];  Service: Endoscopy;  Laterality: N/A;  1020   EYE SURGERY     Removed glass   HAND SURGERY Right    INTRAVASCULAR PRESSURE WIRE/FFR STUDY N/A 10/23/2020   Procedure: INTRAVASCULAR PRESSURE WIRE/FFR STUDY;  Surgeon: Belva Crome, MD;  Location: [facility];  Service: Cardiovascular;  Laterality: N/A;   INTRAVASCULAR PRESSURE WIRE/FFR STUDY N/A 03/30/2021   Procedure: INTRAVASCULAR PRESSURE WIRE/FFR STUDY;  Surgeon: Nelva Bush,  MD;  Location: [facility];  Service: Cardiovascular;  Laterality: N/A;   Left  breast lumpectomy     Benign   LEFT HEART CATH AND CORONARY ANGIOGRAPHY N/A 10/25/2018   Procedure: LEFT HEART CATH AND CORONARY ANGIOGRAPHY;  Surgeon: Wellington Hampshire, MD;  Location: [facility];  Service: Cardiovascular;  Laterality: N/A;   LEFT HEART CATH AND CORONARY ANGIOGRAPHY N/A 10/23/2020   Procedure: LEFT HEART CATH AND CORONARY ANGIOGRAPHY;  Surgeon: Belva Crome, MD;  Location: [facility];  Service: Cardiovascular;  Laterality: N/A;   LEFT HEART CATH AND CORONARY ANGIOGRAPHY N/A 03/30/2021   Procedure: LEFT HEART CATH AND CORONARY ANGIOGRAPHY;  Surgeon: Nelva Bush, MD;  Location: [facility];  Service: Cardiovascular;  Laterality: N/A;   LEFT HEART CATHETERIZATION WITH CORONARY ANGIOGRAM N/A 07/03/2014   Procedure: LEFT HEART CATHETERIZATION WITH CORONARY ANGIOGRAM;  Surgeon: Belva Crome, MD;  Location: [facility];  Service: Cardiovascular;  Laterality: N/A;   POLYPECTOMY  01/05/2017   Procedure: POLYPECTOMY;  Surgeon: Rogene Houston, MD;  [facility];  Service: Endoscopy;;  colon   SHOULDER SURGERY Left    Social History   Occupational History   Occupation: Retired    Comment: disability  Tobacco Use   Smoking status: Former    Packs/day: 0.20    Years: 32.00    Pack years: 6.40    Types: Cigarettes, Cigars    Start date: 07/05/1983    Quit date: 04/03/2012    Years since quitting: 9.4   Smokeless tobacco: Never  Vaping Use   Vaping Use: Never used  Substance and Sexual Activity   Alcohol use: No    Alcohol/week: 0.0 standard drinks   Drug use: No   Sexual activity: Yes    Birth control/protection: Surgical    Comment: hyst

## 2021-09-04 ENCOUNTER — Encounter: Payer: Self-pay | Admitting: Internal Medicine

## 2021-09-04 ENCOUNTER — Ambulatory Visit (INDEPENDENT_AMBULATORY_CARE_PROVIDER_SITE_OTHER): Payer: Medicare Other | Admitting: Internal Medicine

## 2021-09-04 VITALS — BP 124/76 | HR 68 | Ht 64.0 in | Wt 159.0 lb

## 2021-09-04 DIAGNOSIS — Z01810 Encounter for preprocedural cardiovascular examination: Secondary | ICD-10-CM | POA: Diagnosis not present

## 2021-09-04 DIAGNOSIS — M542 Cervicalgia: Secondary | ICD-10-CM | POA: Diagnosis not present

## 2021-09-04 DIAGNOSIS — Z888 Allergy status to other drugs, medicaments and biological substances status: Secondary | ICD-10-CM | POA: Diagnosis not present

## 2021-09-04 DIAGNOSIS — I2510 Atherosclerotic heart disease of native coronary artery without angina pectoris: Secondary | ICD-10-CM | POA: Diagnosis not present

## 2021-09-04 DIAGNOSIS — R262 Difficulty in walking, not elsewhere classified: Secondary | ICD-10-CM | POA: Diagnosis not present

## 2021-09-04 DIAGNOSIS — I10 Essential (primary) hypertension: Secondary | ICD-10-CM | POA: Diagnosis not present

## 2021-09-04 DIAGNOSIS — E785 Hyperlipidemia, unspecified: Secondary | ICD-10-CM | POA: Diagnosis not present

## 2021-09-04 DIAGNOSIS — M6281 Muscle weakness (generalized): Secondary | ICD-10-CM | POA: Diagnosis not present

## 2021-09-04 DIAGNOSIS — M25559 Pain in unspecified hip: Secondary | ICD-10-CM | POA: Diagnosis not present

## 2021-09-04 NOTE — Patient Instructions (Signed)
Medication Instructions:  Your physician recommends that you continue on your current medications as directed. Please refer to the Current Medication list given to you today.   Labwork: Fasting lipids in 6 months before next visit   Testing/Procedures: None today  Follow-Up: 6 months  Any Other Special Instructions Will Be Listed Below (If Applicable).  If you need a refill on your cardiac medications before your next appointment, please call your pharmacy.

## 2021-09-04 NOTE — Progress Notes (Signed)
LIPID CLINIC CONSULT NOTE  Chief Complaint:  No complaints  Primary Care Physician: [place] Nation, MD  Primary Cardiologist:  Pixie Casino, MD  HPI:  Bethany Wang is a 56 y.o. female who is being seen today for the evaluation of dyslipidemia at the request of [place] Nation, MD. This is a pleasant 56 year old female that I do not recall seeing in the hospital but she remembers seeing me with a recent hospitalization.  She was hospitalized in July with chest pain and underwent left heart catheterization.  This demonstrated occlusion of the right coronary artery with the previously placed coronary stent and left to right collaterals.  There was a 50% in-stent restenosis of the proximal LAD and 90% diagonal ostial narrowing.  There is also 60 to 70% obtuse marginal narrowing and 50% distal circumflex disease.  Medical therapy was recommended.  She was referred today for management of dyslipidemia.  Unfortunately she was intolerant of statins including atorvastatin and rosuvastatin, both of which caused significant side effects.  Subsequently she had been switched by her primary care provider to [place] and has already had a dose of the medication.  It is too early to look for response to this medication however I agree with this therapy.  More concerning, however today is symptoms of progressive chest pain.  She reports that ever since this past summer she has had worsening chest pain despite long-acting nitroglycerin.  She has been using short acting nitroglycerin very frequently and gets some minimal relief however it is short-lived.  EKG today shows normal sinus rhythm with some nonspecific T wave changes.  06/08/2021  Bethany Wang returns today for recurrent chest pain.  She has been in the emergency department twice this year including the other week with chest pain.  She said she had an episode when she was at church including diaphoresis, shortness of breath and left arm  pain.  She took nitroglycerin and it took several minutes to up to an hour to improve.  She also notes she gets short of breath and has chest discomfort when exerting herself.  She says she gets chest discomfort working in her garden.  I had sent her for cardiac catheterization in December 2022 for symptoms concerning for unstable angina.  Demonstrated moderate to severe multivessel coronary disease, similar to prior cath in July 2022 however FFR was negative of the mid LAD stent in OM1 lesion and a chronic total occlusion of the proximal RCA stent was again noted.  LVEDP was mildly elevated.  More antianginal therapy was recommended.  Initially she was switched from isosorbide 30 mg 3 times daily to 60 mg twice daily.  She says initially this helped somewhat but since then her symptoms have worsened.  She is now taking sublingual nitro almos  09/04/2021  Bethany Wang is seen today in follow-up.  She reports marked improvement in her symptoms.  Blood pressure is now much better controlled at 124/76.  It turns out she had a rash that was attributable to the Ranexa.  She stopped that.  She had been switched to [place] which she is tolerating very well and has had a significant improvement in her cholesterol.  In February showed total cholesterol 123 (down from 216), triglycerides 72, HDL 52 and LDL 57.  Overall she is very pleased with how she is feeling.  She is still struggling with bilateral hip pain and has been working with Dr. Inda Merlin who is contemplating sequential, bilateral hip replacement.  PMHx:  Past Medical History:  Diagnosis Date   Anemia    Anxiety    Bilateral breast cysts 12/30/2015   Brain tumor (HCC)    Breast cancer (HCC)    Left Breast Cancer   Breast disorder    BV (bacterial vaginosis) 09/09/2015   Coronary atherosclerosis of native coronary artery    a. s/p DES to RCA in 2013 b. DES to LAD in 2014 c. cath in 04/2016 showing patent LAD stent with D2 jailed by LAD stent and CTO  of RCA with left to right collaterals present   Cyst of pharynx or nasopharynx    [name]   Decreased libido 12/25/2018   Depression    Diabetes mellitus without complication (HCC)    Essential hypertension    Falls    x 3-4 in past year   GERD (gastroesophageal reflux disease)    Headache    Heart attack (HCC)    x4   History of hematuria    Mixed hyperlipidemia    Personal history of chemotherapy    Left Breast Cancer   Personal history of radiation therapy    Left Breast Cancer   Pre-diabetes    PUD (peptic ulcer disease)    Seizures (HCC)    Shingles 09/09/2015   Stroke (HCC)    12-2017 on Plavix, only deficit is headaches   Suicide attempt (HCC)    3 attempts in remote past   Trichimoniasis 08/20/2019   Treated 08/20/19    Uterine cancer (HCC)    Vitamin D deficiency disease 04/03/2019    Past Surgical History:  Procedure Laterality Date   ABDOMINAL HYSTERECTOMY     BIOPSY  11/28/2019   Procedure: BIOPSY;  Surgeon: Malissa Hippo, MD;  [facility];  Service: Endoscopy;;  antral, gastric body    BLADDER SURGERY     BREAST LUMPECTOMY Left    BREAST LUMPECTOMY Right 02/24/2018   Procedure: RIGHT BREAST LUMPECTOMY ERAS PATHWAY;  Surgeon: Griselda Miner, MD;  Location: [facility] OR;  Service: General;  Laterality: Right;   CARDIAC CATHETERIZATION N/A 05/10/2016   Procedure: Left Heart Cath and Coronary Angiography;  Surgeon: Lyn Records, MD;  Location: [facility] INVASIVE CV LAB;  Service: Cardiovascular;  Laterality: N/A;   CAUTERIZE INNER NOSE  07/13/2020   COLONOSCOPY  May 2010   Fleishman: normal rectum, internal hemorrhoids, , benign colonic polyp   COLONOSCOPY N/A 01/05/2017   Procedure: COLONOSCOPY;  Surgeon: Malissa Hippo, MD;  [facility];  Service: Endoscopy;  Laterality: N/A;  1:00   ESOPHAGEAL DILATION  11/28/2019   Procedure: ESOPHAGEAL DILATION;  Surgeon: Malissa Hippo, MD;  [facility];  Service:  Endoscopy;;   ESOPHAGOGASTRODUODENOSCOPY     2001 Dr. Madilyn Fireman: distal esophagitis, small hiatal hernia,. Dr. Katrinka Blazing 2006? no records available currently, pt also reports  EGD a few years ago with Dr. Jena Gauss, do not have these reports anywhere in medical records   ESOPHAGOGASTRODUODENOSCOPY  06/02/2011   AJN:MOBXGM pill impaction as described above s/p dilation of a probable cervical esophageal web/bx abnormal esophageal and gastric mucosa. + H.pylori gastritis    ESOPHAGOGASTRODUODENOSCOPY (EGD) WITH PROPOFOL N/A 11/28/2019   Procedure: ESOPHAGOGASTRODUODENOSCOPY (EGD) WITH PROPOFOL;  Surgeon: Malissa Hippo, MD;  [facility];  Service: Endoscopy;  Laterality: N/A;  1020   EYE SURGERY     Removed glass   HAND SURGERY Right    INTRAVASCULAR PRESSURE WIRE/FFR STUDY N/A 10/23/2020  Procedure: INTRAVASCULAR PRESSURE WIRE/FFR STUDY;  Surgeon: Belva Crome, MD;  Location: [place] CV LAB;  Service: Cardiovascular;  Laterality: N/A;   INTRAVASCULAR PRESSURE WIRE/FFR STUDY N/A 03/30/2021   Procedure: INTRAVASCULAR PRESSURE WIRE/FFR STUDY;  Surgeon: Nelva Bush, MD;  Location: [place] CV LAB;  Service: Cardiovascular;  Laterality: N/A;   Left breast lumpectomy     Benign   LEFT HEART CATH AND CORONARY ANGIOGRAPHY N/A 10/25/2018   Procedure: LEFT HEART CATH AND CORONARY ANGIOGRAPHY;  Surgeon: Wellington Hampshire, MD;  Location: [place] CV LAB;  Service: Cardiovascular;  Laterality: N/A;   LEFT HEART CATH AND CORONARY ANGIOGRAPHY N/A 10/23/2020   Procedure: LEFT HEART CATH AND CORONARY ANGIOGRAPHY;  Surgeon: Belva Crome, MD;  Location: [place] CV LAB;  Service: Cardiovascular;  Laterality: N/A;   LEFT HEART CATH AND CORONARY ANGIOGRAPHY N/A 03/30/2021   Procedure: LEFT HEART CATH AND CORONARY ANGIOGRAPHY;  Surgeon: Nelva Bush, MD;  Location: [place] CV LAB;  Service: Cardiovascular;  Laterality: N/A;   LEFT HEART CATHETERIZATION WITH CORONARY ANGIOGRAM N/A 07/03/2014    Procedure: LEFT HEART CATHETERIZATION WITH CORONARY ANGIOGRAM;  Surgeon: Belva Crome, MD;  Location: [facility] CATH LAB;  Service: Cardiovascular;  Laterality: N/A;   POLYPECTOMY  01/05/2017   Procedure: POLYPECTOMY;  Surgeon: Rogene Houston, MD;  [facility];  Service: Endoscopy;;  colon   SHOULDER SURGERY Left     FAMHx:  Family History  Problem Relation Age of Onset   Colon cancer Paternal Grandfather    Cancer Father    Cancer Maternal Uncle    Alzheimer's disease Paternal Aunt    Cirrhosis Maternal Uncle    Cancer Paternal Aunt        lung   Aneurysm Mother        brain   Heart disease Brother    Heart attack Brother    Mental illness Daughter    ADD / ADHD Daughter    Bipolar disorder Daughter    Mental illness Son    ADD / ADHD Son    Bipolar disorder Son    Mental illness Son    ADD / ADHD Son    Bipolar disorder Son     SOCHx:   reports that she quit smoking about 9 years ago. Her smoking use included cigarettes and cigars. She started smoking about 38 years ago. She has a 6.40 pack-year smoking history. She has never used smokeless tobacco. She reports that she does not drink alcohol and does not use drugs.  ALLERGIES:  Allergies  Allergen Reactions   Bee Venom Anaphylaxis and Other (See Comments)    "throat swelled and had to the hospital" - Yellow jacket sting   Lipitor [Atorvastatin] Rash and Other (See Comments)    Liver damage & Upper Abdominal pain, Break out like a rash . Per last hosp visit. It exposed on inpatient stay.   Penicillins Anaphylaxis and Other (See Comments)    Has patient had a PCN reaction causing immediate rash, facial/tongue/throat swelling, SOB or lightheadedness with hypotension: Yes Has patient had a PCN reaction causing severe rash involving mucus membranes or skin necrosis: Yes Has patient had a PCN reaction that required hospitalization Yes Has patient had a PCN reaction occurring within the last 10 years: Yes If all of  the above answers are "NO", then may proceed with Cephalosporin use. Patient states she carries an epi-pen   Shellfish Allergy Swelling   Crestor [Rosuvastatin]  Upper Abdominal pain, Break out like a rash   Tomato Itching and Swelling   Valsartan Itching   Zofran [Ondansetron] Itching    ROS: Pertinent items noted in HPI and remainder of comprehensive ROS otherwise negative.  HOME MEDS: Current Outpatient Medications on File Prior to Visit  Medication Sig Dispense Refill   albuterol (VENTOLIN HFA) 108 (90 Base) MCG/ACT inhaler Inhale 2 puffs into the lungs every 6 (six) hours as needed for wheezing or shortness of breath.     ALPRAZolam (XANAX) 0.5 MG tablet Take 1 tablet (0.5 mg total) by mouth 2 (two) times daily as needed for anxiety. 30 tablet 0   amLODipine (NORVASC) 10 MG tablet TAKE 1 TABLET ONCE DAILY. 90 tablet 3   aspirin EC 81 MG tablet Take 1 tablet (81 mg total) by mouth daily. 180 tablet 1   blood glucose meter kit and supplies KIT Use to check blood sugar daily. 1 each 0   Cholecalciferol (DIALYVITE VITAMIN D 5000) 125 MCG (5000 UT) capsule Take 5,000 Units by mouth daily.     clopidogrel (PLAVIX) 75 MG tablet Take 75 mg by mouth daily.     diphenhydrAMINE (BENADRYL) 25 MG tablet Take 25 mg by mouth every 6 (six) hours as needed for allergies.     escitalopram (LEXAPRO) 20 MG tablet Take 1 tablet (20 mg total) by mouth daily. 30 tablet 3   Evolocumab (REPATHA SURECLICK [place]) Inject 485 mg into the skin every 14 (fourteen) days.     fluticasone-salmeterol (ADVAIR) 100-50 MCG/ACT AEPB INHALE 1 PUFF BY MOUTH TWICE DAILY 60 each 10   hydrochlorothiazide (HYDRODIURIL) 12.5 MG tablet Take 12.5 mg by mouth daily.     ibuprofen (ADVIL) 200 MG tablet Take 200 mg by mouth every 6 (six) hours as needed.     isosorbide mononitrate (IMDUR) 60 MG 24 hr tablet Take 1 tablet (60 mg total) by mouth 2 (two) times daily. 60 tablet 5   LINZESS 145 MCG CAPS capsule Take 1 capsule (145 mcg  total) by mouth daily. 30 capsule 3   metoprolol tartrate (LOPRESSOR) 25 MG tablet Take 1 tablet (25 mg total) by mouth 2 (two) times daily. 180 tablet 3   nitroGLYCERIN (NITROSTAT) 0.4 MG SL tablet DISSOLVE 1 TABLET UNDER THE TONGUE AS NEEDED FOR CHEST PAIN EVERY 5 MINUTES UP TO 3 TIMES. IF NO RELIEF CALL 911. (Patient taking differently: Place 0.4 mg under the tongue every 5 (five) minutes as needed for chest pain.) 25 tablet 1   prednisoLONE acetate (PRED FORTE) 1 % ophthalmic suspension Place 1 drop into both eyes daily as needed (dry eyes/eye infection symptoms).     ranolazine (RANEXA) 500 MG 12 hr tablet Take 1 tablet (500 mg total) by mouth 2 (two) times daily. 60 tablet 11   thyroid (ARMOUR) 60 MG tablet Take 60 mg by mouth daily before breakfast.     traMADol (ULTRAM) 50 MG tablet Take 50 mg by mouth every 6 (six) hours as needed for moderate pain.     Current Facility-Administered Medications on File Prior to Visit  Medication Dose Route Frequency Provider Last Rate Last Admin   NONFORMULARY OR COMPOUNDED ITEM 0.2 mL  0.2 mL Intramuscular Q7 days [name], Nimish C, MD   0.2 mL at 10/23/19 1002    LABS/IMAGING: No results found for this or any previous visit (from the past 48 hour(s)). No results found.  LIPID PANEL:    Component Value Date/Time   CHOL 123 06/08/2021 1016  TRIG 72 06/08/2021 1016   HDL 52 06/08/2021 1016   CHOLHDL 2.4 06/08/2021 1016   VLDL 14 06/08/2021 1016   LDLCALC 57 06/08/2021 1016   LDLCALC 146 (H) 07/03/2020 1334    WEIGHTS: Wt Readings from Last 3 Encounters:  09/04/21 159 lb (72.1 kg)  09/03/21 152 lb (68.9 kg)  06/08/21 159 lb (72.1 kg)    VITALS: BP 124/76   Pulse 68   Ht $R'5\' 4"'YM$  (1.626 m)   Wt 159 lb (72.1 kg)   SpO2 96%   BMI 27.29 kg/m   EXAM: General appearance: alert and no distress Neck: no carotid bruit, no JVD, and thyroid not enlarged, symmetric, no tenderness/mass/nodules Lungs: clear to auscultation bilaterally Heart:  regular rate and rhythm, S1, S2 normal, no murmur, click, rub or gallop Abdomen: soft, non-tender; bowel sounds normal; no masses,  no organomegaly Extremities: extremities normal, atraumatic, no cyanosis or edema Pulses: 2+ and symmetric Skin: Skin color, texture, turgor normal. No rashes or lesions Neurologic: Grossly normal Psych: Pleasant  EKG: N/A  ASSESSMENT: Chronic stable angina Coronary artery disease with prior PCI and occluded right coronary with left-to-right collaterals, 50% diffuse LAD stenosis (10/2020) -repeat cardiac catheterization 03/2021, unchanged, medical therapy recommended Mixed dyslipidemia, goal LDL less than 70 Statin allergic - hives, now on Repatha Hypertension  PLAN: 1.   Ms. Wang reports marked improvement and even resolution in her chest pain.  Blood pressure is now well controlled.  Her cholesterols come down significantly on Repatha to target LDL less than 70.  From a medical standpoint she seems to be optimized.  Her orthopedist is considering upcoming sequential right and left hip replacement.  I think she is at acceptable risk for these procedures.  If necessary, she could hold Plavix 5 days and aspirin 7 days prior to the procedure and restart afterwards.  Plan follow-up with me in 6 months with a repeat lipid just prior to that visit.  Pixie Casino, MD, [facility], [place] Director of the Advanced Lipid Disorders &   Cardiovascular Risk Reduction Clinic Diplomate of the American Board of Clinical Lipidology Attending Cardiologist  Direct Dial: [phone]  Fax: [phone]  Website:  [email or web address] 09/04/2021, 2:34 PM

## 2021-09-08 DIAGNOSIS — M25559 Pain in unspecified hip: Secondary | ICD-10-CM | POA: Diagnosis not present

## 2021-09-08 DIAGNOSIS — I2510 Atherosclerotic heart disease of native coronary artery without angina pectoris: Secondary | ICD-10-CM | POA: Diagnosis not present

## 2021-09-08 DIAGNOSIS — I10 Essential (primary) hypertension: Secondary | ICD-10-CM | POA: Diagnosis not present

## 2021-09-08 DIAGNOSIS — R109 Unspecified abdominal pain: Secondary | ICD-10-CM | POA: Diagnosis not present

## 2021-09-08 DIAGNOSIS — E785 Hyperlipidemia, unspecified: Secondary | ICD-10-CM | POA: Diagnosis not present

## 2021-09-08 DIAGNOSIS — E039 Hypothyroidism, unspecified: Secondary | ICD-10-CM | POA: Diagnosis not present

## 2021-09-08 DIAGNOSIS — T466X5A Adverse effect of antihyperlipidemic and antiarteriosclerotic drugs, initial encounter: Secondary | ICD-10-CM | POA: Diagnosis not present

## 2021-09-08 DIAGNOSIS — G720 Drug-induced myopathy: Secondary | ICD-10-CM | POA: Diagnosis not present

## 2021-09-08 MED ORDER — METHYLPREDNISOLONE ACETATE 40 MG/ML IJ SUSP
40.0000 mg | INTRAMUSCULAR | Status: AC | PRN
  Administered 2021-09-03: 40 mg via INTRA_ARTICULAR

## 2021-09-08 MED ORDER — LIDOCAINE HCL 1 % IJ SOLN
1.0000 mL | INTRAMUSCULAR | Status: AC | PRN
  Administered 2021-09-03: 1 mL

## 2021-09-08 MED ORDER — BUPIVACAINE HCL 0.25 % IJ SOLN
2.0000 mL | INTRAMUSCULAR | Status: AC | PRN
  Administered 2021-09-03: 2 mL via INTRA_ARTICULAR

## 2021-09-09 ENCOUNTER — Other Ambulatory Visit: Payer: Self-pay

## 2021-09-09 DIAGNOSIS — M6281 Muscle weakness (generalized): Secondary | ICD-10-CM | POA: Diagnosis not present

## 2021-09-09 DIAGNOSIS — R262 Difficulty in walking, not elsewhere classified: Secondary | ICD-10-CM | POA: Diagnosis not present

## 2021-09-09 DIAGNOSIS — M542 Cervicalgia: Secondary | ICD-10-CM | POA: Diagnosis not present

## 2021-09-09 DIAGNOSIS — M25559 Pain in unspecified hip: Secondary | ICD-10-CM | POA: Diagnosis not present

## 2021-09-11 ENCOUNTER — Emergency Department (HOSPITAL_COMMUNITY)
Admission: EM | Admit: 2021-09-11 | Discharge: 2021-09-11 | Disposition: A | Discharge status: Home / Self Care | Payer: Medicare Other | Attending: Emergency Medicine | Admitting: Emergency Medicine

## 2021-09-11 ENCOUNTER — Encounter (HOSPITAL_COMMUNITY): Payer: Self-pay | Admitting: *Deleted

## 2021-09-11 ENCOUNTER — Emergency Department (HOSPITAL_COMMUNITY): Payer: Medicare Other

## 2021-09-11 ENCOUNTER — Other Ambulatory Visit: Payer: Self-pay

## 2021-09-11 DIAGNOSIS — Z79899 Other long term (current) drug therapy: Secondary | ICD-10-CM | POA: Insufficient documentation

## 2021-09-11 DIAGNOSIS — I10 Essential (primary) hypertension: Secondary | ICD-10-CM | POA: Insufficient documentation

## 2021-09-11 DIAGNOSIS — W19XXXA Unspecified fall, initial encounter: Secondary | ICD-10-CM | POA: Insufficient documentation

## 2021-09-11 DIAGNOSIS — M25551 Pain in right hip: Secondary | ICD-10-CM | POA: Diagnosis not present

## 2021-09-11 DIAGNOSIS — Z7982 Long term (current) use of aspirin: Secondary | ICD-10-CM | POA: Insufficient documentation

## 2021-09-11 DIAGNOSIS — I2510 Atherosclerotic heart disease of native coronary artery without angina pectoris: Secondary | ICD-10-CM | POA: Diagnosis not present

## 2021-09-11 DIAGNOSIS — Y9259 Other trade areas as the place of occurrence of the external cause: Secondary | ICD-10-CM | POA: Insufficient documentation

## 2021-09-11 MED ORDER — MORPHINE SULFATE (PF) 2 MG/ML IV SOLN
2.0000 mg | Freq: Once | INTRAVENOUS | Status: AC
  Administered 2021-09-11: 2 mg via INTRAMUSCULAR
  Filled 2021-09-11: qty 1

## 2021-09-11 NOTE — ED Provider Notes (Signed)
[facility] Provider Note   CSN: [id number] Arrival date & time: 09/11/21  1710     History  Chief Complaint  Patient presents with   Bethany Wang is a 56 y.o. female.  HPI Patient is a 56 year old female with a history of hyperlipidemia, hypertension, CAD, who presents to the emergency department due to a fall that occurred earlier today.  Patient states that she has a history of right hip pain and recently began seeing Dr. Lorin Mercy with orthopedics for this.  She was diagnosed with trochanteric bursitis and has had injections in the right hip and was doing well.  She typically ambulates with a cane at baseline but forgot that today and went to the [employer] office.  While walking in the hall on a linoleum floor she began experiencing pain in the right hip and fell to her right side and landed against the wall with her right hip worsening her pain.  She then slid to the floor.  No head trauma or LOC.  Reports worsening pain in the right hip due to this.  Also reports tingling in the right foot.  No numbness.    Home Medications Prior to Admission medications   Medication Sig Start Date End Date Taking? Authorizing Provider  albuterol (VENTOLIN HFA) 108 (90 Base) MCG/ACT inhaler Inhale 2 puffs into the lungs every 6 (six) hours as needed for wheezing or shortness of breath.    [provider]  ALPRAZolam Duanne Moron) 0.5 MG tablet Take 1 tablet (0.5 mg total) by mouth 2 (two) times daily as needed for anxiety. 09/02/20   Gosrani, Nimish C, MD  amLODipine (NORVASC) 10 MG tablet TAKE 1 TABLET ONCE DAILY. 02/03/21   Thompson Grayer, MD  aspirin EC 81 MG tablet Take 1 tablet (81 mg total) by mouth daily. 07/16/20   Strader, Fransisco Hertz, PA-C  blood glucose meter kit and supplies KIT Use to check blood sugar daily. 11/12/20   Ailene Ards, NP  Cholecalciferol (DIALYVITE VITAMIN D 5000) 125 MCG (5000 UT) capsule Take 5,000 Units by mouth daily.    [provider]  clopidogrel (PLAVIX) 75 MG tablet Take 75 mg by mouth daily.    [provider]  diphenhydrAMINE (BENADRYL) 25 MG tablet Take 25 mg by mouth every 6 (six) hours as needed for allergies.    [provider]  escitalopram (LEXAPRO) 20 MG tablet Take 1 tablet (20 mg total) by mouth daily. 10/21/20   [name], MD  Evolocumab (REPATHA SURECLICK [place]) Inject 939 mg into the skin every 14 (fourteen) days.    [provider]  fluticasone-salmeterol (ADVAIR) 100-50 MCG/ACT AEPB INHALE 1 PUFF BY MOUTH TWICE DAILY 08/25/20   Hurshel Party C, MD  hydrochlorothiazide (HYDRODIURIL) 12.5 MG tablet Take 12.5 mg by mouth daily.    [provider]  ibuprofen (ADVIL) 200 MG tablet Take 200 mg by mouth every 6 (six) hours as needed.    [provider]  isosorbide mononitrate (IMDUR) 60 MG 24 hr tablet Take 1 tablet (60 mg total) by mouth 2 (two) times daily. 03/30/21   End, Harrell Gave, MD  LINZESS 145 MCG CAPS capsule Take 1 capsule (145 mcg total) by mouth daily. 10/21/20   [name], MD  metoprolol tartrate (LOPRESSOR) 25 MG tablet Take 1 tablet (25 mg total) by mouth 2 (two) times daily. 12/09/20   Satira Sark, MD  nitroGLYCERIN (NITROSTAT) 0.4 MG SL tablet DISSOLVE 1  TABLET UNDER THE TONGUE AS NEEDED FOR CHEST PAIN EVERY 5 MINUTES UP TO 3 TIMES. IF NO RELIEF CALL 911. Patient taking differently: Place 0.4 mg under the tongue every 5 (five) minutes as needed for chest pain. 09/05/20   Satira Sark, MD  prednisoLONE acetate (PRED FORTE) 1 % ophthalmic suspension Place 1 drop into both eyes daily as needed (dry eyes/eye infection symptoms).    [provider]  ranolazine (RANEXA) 500 MG 12 hr tablet Take 1 tablet (500 mg total) by mouth 2 (two) times daily. 06/08/21   Hilty, Nadean Corwin, MD  thyroid (ARMOUR) 60 MG tablet Take 60 mg by mouth daily before breakfast.    [provider]  traMADol (ULTRAM) 50 MG tablet Take  50 mg by mouth every 6 (six) hours as needed for moderate pain.    [provider]      Allergies    Bee venom, Lipitor [atorvastatin], Penicillins, Shellfish allergy, Crestor [rosuvastatin], Tomato, Valsartan, and Zofran [ondansetron]    Review of Systems   Review of Systems  Musculoskeletal:  Positive for arthralgias and myalgias.  Skin:  Negative for wound.  Neurological:  Negative for syncope, numbness and headaches.   Physical Exam Updated Vital Signs BP (!) 173/88 (BP Location: Right Arm)   Pulse 64   Temp 98.2 F (36.8 C) (Oral)   Resp 18   Ht $R'5\' 4"'NI$  (1.626 m)   Wt 72.3 kg   SpO2 98%   BMI 27.36 kg/m  Physical Exam Vitals and nursing note reviewed.  Constitutional:      General: She is not in acute distress.    Appearance: She is well-developed.  HENT:     Head: Normocephalic and atraumatic.     Right Ear: External ear normal.     Left Ear: External ear normal.  Eyes:     General: No scleral icterus.       Right eye: No discharge.        Left eye: No discharge.     Conjunctiva/sclera: Conjunctivae normal.  Neck:     Trachea: No tracheal deviation.  Cardiovascular:     Rate and Rhythm: Normal rate.  Pulmonary:     Effort: Pulmonary effort is normal. No respiratory distress.     Breath sounds: No stridor.  Abdominal:     General: There is no distension.  Musculoskeletal:        General: Tenderness present. No swelling or deformity.     Cervical back: Neck supple.     Comments: Moderate TTP noted along the right anterior and lateral hip.  Unable to assess range of motion due to the patient's pain.  Distal sensation intact.  Palpable pedal pulses.  Patient is able to stand and ambulate with an antalgic gait with the assistance of a cane.  Skin:    General: Skin is warm and dry.     Findings: No rash.  Neurological:     General: No focal deficit present.     Mental Status: She is alert and oriented to person, place, and time.     Cranial Nerves:  Cranial nerve deficit: no gross deficits.  Psychiatric:        Mood and Affect: Mood normal.        Behavior: Behavior normal.   ED Results / Procedures / Treatments   Labs (all labs ordered are listed, but only abnormal results are displayed) Labs Reviewed - No data to display  EKG None  Radiology DG Hip  Unilat  With Pelvis 2-3 Views Right  Result Date: 09/11/2021 CLINICAL DATA:  Fall.  Right hip pain EXAM: DG HIP (WITH OR WITHOUT PELVIS) 2-3V RIGHT COMPARISON:  08/19/2021 FINDINGS: There is no evidence of hip fracture or dislocation. Similar mild degenerative changes. No appreciable soft tissue abnormality. IMPRESSION: Negative. Electronically Signed   By: Davina Poke D.O.   On: 09/11/2021 17:46    Procedures Procedures   Medications Ordered in ED Medications  morphine (PF) 2 MG/ML injection 2 mg (has no administration in time range)    ED Course/ Medical Decision Making/ A&P                           Medical Decision Making Amount and/or Complexity of Data Reviewed Radiology: ordered.  Risk Prescription drug management.  Patient is a 57 year old female who presents to the emergency department due to right hip pain that worsened today after falling against a wall.  Patient was walking on a linoleum floor and began experiencing worsening pain in the right hip and due to her not having her cane fell to her right and landed on the wall which worsened her pain even more.  She is followed by Dr. Lorin Mercy with orthopedics.  On my exam patient has moderate tenderness to the right anterior and lateral portion of the hip.  Neurovascularly intact distal to the injury.  X-rays were obtained in triage which are "negative."  Patient is able to stand and ambulate with an antalgic gait with the assistance of her cane.  Patient's pain was treated with IM morphine.  On reassessment patient reports moderate improvement.  She is eager to be discharged home.  Feel that this is reasonable.  Her  husband is at bedside and is going to drive her home.  He is going to monitor her tonight and help her with ambulation as needed.  Recommended that she follow-up with Dr. Lorin Mercy regarding her hip pain as well as this fall.  She verbalized understanding.  We discussed return precautions.  Her questions were answered and she was amicable at the time of discharge.  Patient appears stable for discharge at this time and she is agreeable.  We discussed return precautions.  Recommended follow-up with orthopedics.  She verbalized understanding.  Her questions were answered and she was amicable at the time of discharge.  Final Clinical Impression(s) / ED Diagnoses Final diagnoses:  Fall, initial encounter  Right hip pain   Rx / DC Orders ED Discharge Orders     None         Rayna Sexton, PA-C 09/11/21 1926    Davonna Belling, MD 09/12/21 [phone]

## 2021-09-11 NOTE — Discharge Instructions (Signed)
Like we discussed, please continue to monitor your symptoms closely.  If you develop any new or worsening symptoms please come back to the emergency department.  Please follow-up with Dr. Lorin Mercy regarding your hip pain as well as this fall today.

## 2021-09-11 NOTE — ED Triage Notes (Addendum)
Pt in c/o R sided hip pain today after falling today while at the [employer] office, landed on linoleum floor, pt is being treated for chronic hip pain with injections last week, pt denies hitting head or LOC, pt takes Plavix and ASA, denies shortening or rotation, A&O x4

## 2021-09-14 DIAGNOSIS — M25559 Pain in unspecified hip: Secondary | ICD-10-CM | POA: Diagnosis not present

## 2021-09-14 DIAGNOSIS — M542 Cervicalgia: Secondary | ICD-10-CM | POA: Diagnosis not present

## 2021-09-14 DIAGNOSIS — R262 Difficulty in walking, not elsewhere classified: Secondary | ICD-10-CM | POA: Diagnosis not present

## 2021-09-14 DIAGNOSIS — M6281 Muscle weakness (generalized): Secondary | ICD-10-CM | POA: Diagnosis not present

## 2021-09-15 DIAGNOSIS — M25559 Pain in unspecified hip: Secondary | ICD-10-CM | POA: Diagnosis not present

## 2021-09-15 DIAGNOSIS — I2510 Atherosclerotic heart disease of native coronary artery without angina pectoris: Secondary | ICD-10-CM | POA: Diagnosis not present

## 2021-09-15 DIAGNOSIS — R531 Weakness: Secondary | ICD-10-CM | POA: Diagnosis not present

## 2021-09-15 DIAGNOSIS — I509 Heart failure, unspecified: Secondary | ICD-10-CM | POA: Diagnosis not present

## 2021-09-15 DIAGNOSIS — I10 Essential (primary) hypertension: Secondary | ICD-10-CM | POA: Diagnosis not present

## 2021-09-18 DIAGNOSIS — D582 Other hemoglobinopathies: Secondary | ICD-10-CM | POA: Diagnosis not present

## 2021-09-18 DIAGNOSIS — Z131 Encounter for screening for diabetes mellitus: Secondary | ICD-10-CM | POA: Diagnosis not present

## 2021-09-18 DIAGNOSIS — E785 Hyperlipidemia, unspecified: Secondary | ICD-10-CM | POA: Diagnosis not present

## 2021-09-18 DIAGNOSIS — I10 Essential (primary) hypertension: Secondary | ICD-10-CM | POA: Diagnosis not present

## 2021-09-18 DIAGNOSIS — E039 Hypothyroidism, unspecified: Secondary | ICD-10-CM | POA: Diagnosis not present

## 2021-09-18 NOTE — Telephone Encounter (Signed)
Called and spoke with patient about Irbersartan and Losartan and she stated the that Pharmacy is going off of old medications and she is not taking either medications and no refills of any medication was needed.

## 2021-09-21 DIAGNOSIS — M542 Cervicalgia: Secondary | ICD-10-CM | POA: Diagnosis not present

## 2021-09-21 DIAGNOSIS — R262 Difficulty in walking, not elsewhere classified: Secondary | ICD-10-CM | POA: Diagnosis not present

## 2021-09-21 DIAGNOSIS — M25559 Pain in unspecified hip: Secondary | ICD-10-CM | POA: Diagnosis not present

## 2021-09-21 DIAGNOSIS — M6281 Muscle weakness (generalized): Secondary | ICD-10-CM | POA: Diagnosis not present

## 2021-09-24 DIAGNOSIS — I2510 Atherosclerotic heart disease of native coronary artery without angina pectoris: Secondary | ICD-10-CM | POA: Diagnosis not present

## 2021-09-24 DIAGNOSIS — G720 Drug-induced myopathy: Secondary | ICD-10-CM | POA: Diagnosis not present

## 2021-09-24 DIAGNOSIS — I10 Essential (primary) hypertension: Secondary | ICD-10-CM | POA: Diagnosis not present

## 2021-09-24 DIAGNOSIS — T466X5A Adverse effect of antihyperlipidemic and antiarteriosclerotic drugs, initial encounter: Secondary | ICD-10-CM | POA: Diagnosis not present

## 2021-09-24 DIAGNOSIS — I509 Heart failure, unspecified: Secondary | ICD-10-CM | POA: Diagnosis not present

## 2021-09-24 DIAGNOSIS — R262 Difficulty in walking, not elsewhere classified: Secondary | ICD-10-CM | POA: Diagnosis not present

## 2021-09-24 DIAGNOSIS — M6281 Muscle weakness (generalized): Secondary | ICD-10-CM | POA: Diagnosis not present

## 2021-09-24 DIAGNOSIS — R109 Unspecified abdominal pain: Secondary | ICD-10-CM | POA: Diagnosis not present

## 2021-09-24 DIAGNOSIS — M25559 Pain in unspecified hip: Secondary | ICD-10-CM | POA: Diagnosis not present

## 2021-09-24 DIAGNOSIS — E785 Hyperlipidemia, unspecified: Secondary | ICD-10-CM | POA: Diagnosis not present

## 2021-09-24 DIAGNOSIS — M542 Cervicalgia: Secondary | ICD-10-CM | POA: Diagnosis not present

## 2021-09-24 DIAGNOSIS — E039 Hypothyroidism, unspecified: Secondary | ICD-10-CM | POA: Diagnosis not present

## 2021-09-24 DIAGNOSIS — R531 Weakness: Secondary | ICD-10-CM | POA: Diagnosis not present

## 2021-09-24 DIAGNOSIS — R7989 Other specified abnormal findings of blood chemistry: Secondary | ICD-10-CM | POA: Diagnosis not present

## 2021-09-28 DIAGNOSIS — M25559 Pain in unspecified hip: Secondary | ICD-10-CM | POA: Diagnosis not present

## 2021-09-28 DIAGNOSIS — M542 Cervicalgia: Secondary | ICD-10-CM | POA: Diagnosis not present

## 2021-09-28 DIAGNOSIS — R262 Difficulty in walking, not elsewhere classified: Secondary | ICD-10-CM | POA: Diagnosis not present

## 2021-09-28 DIAGNOSIS — M6281 Muscle weakness (generalized): Secondary | ICD-10-CM | POA: Diagnosis not present

## 2021-09-30 DIAGNOSIS — M6281 Muscle weakness (generalized): Secondary | ICD-10-CM | POA: Diagnosis not present

## 2021-09-30 DIAGNOSIS — R262 Difficulty in walking, not elsewhere classified: Secondary | ICD-10-CM | POA: Diagnosis not present

## 2021-09-30 DIAGNOSIS — M542 Cervicalgia: Secondary | ICD-10-CM | POA: Diagnosis not present

## 2021-09-30 DIAGNOSIS — M25559 Pain in unspecified hip: Secondary | ICD-10-CM | POA: Diagnosis not present

## 2021-10-01 ENCOUNTER — Ambulatory Visit (INDEPENDENT_AMBULATORY_CARE_PROVIDER_SITE_OTHER): Payer: Medicare Other | Admitting: Orthopaedic Surgery

## 2021-10-01 DIAGNOSIS — M5010 Cervical disc disorder with radiculopathy, unspecified cervical region: Secondary | ICD-10-CM | POA: Diagnosis not present

## 2021-10-01 NOTE — Progress Notes (Signed)
Office Visit Note   Patient: Bethany Wang           Date of Birth: 1965-11-08           MRN: [id number] Visit Date: 10/01/2021              Requested by: Donetta Potts, MD [address] [place],  [place] [address] PCP: Donetta Potts, MD   Assessment & Plan: Visit Diagnoses:  1. Cervical disc disorder with radiculopathy of cervical region     Plan: Patient's previous x-ray showed some spondylitic changes C5-6 C6-7 [facility] and read by Dr. Gerome Sam III on 09/04/2021.  With her persistent neck and shoulder pain failure to respond to anti-inflammatories physical therapy I recommend proceeding with cervical MRI scan to rule out compression left side cervical spine.  Follow-up after MRI scan.  Follow-Up Instructions: No follow-ups on file.   Orders:  Orders Placed This Encounter  Procedures   MR Cervical Spine w/o contrast   No orders of the defined types were placed in this encounter.     Procedures: No procedures performed   Clinical Data: No additional findings.   Subjective: Chief Complaint  Patient presents with   Right Hip - Follow-up   Left Shoulder - Pain    HPI 56 year old female returns she is continuing to do therapy for her left shoulder and cervical spine with persistent pain in the supraspinatus region and left side of her neck.  Therapist notes reports significant pain with rotation of her neck with Spurling type maneuver.  Pain radiates down the radial side of her left hand no problems in the right hand.  She still has some soreness in her lower back but seems to be better if she has some problems with strengthening over right lower extremity with single leg stance quad weakness causing her to be shaky.  Does much better with the left leg only.  Review of Systems all the systems update unchanged.   Objective: Vital Signs: There were no vitals taken for this visit.  Physical Exam Constitutional:      Appearance: She is  well-developed.  HENT:     Head: Normocephalic.     Right Ear: External ear normal.     Left Ear: External ear normal. There is no impacted cerumen.  Eyes:     Pupils: Pupils are equal, round, and reactive to light.  Neck:     Thyroid: No thyromegaly.     Trachea: No tracheal deviation.  Cardiovascular:     Rate and Rhythm: Normal rate.  Pulmonary:     Effort: Pulmonary effort is normal.  Abdominal:     Palpations: Abdomen is soft.  Musculoskeletal:     Cervical back: No rigidity.  Skin:    General: Skin is warm and dry.  Neurological:     Mental Status: She is alert and oriented to person, place, and time.  Psychiatric:        Behavior: Behavior normal.     Ortho Exam no clonus lower extremities reflexes are symmetrical.  She has some tremor when she has right leg single stance with some quad weakness with resisted testing.  Left side much stronger.  No quad atrophy.  Significant brachial plexus tenderness on the left increased pain with cervical compression positive Spurling on the left negative on the right.  Negative Lhermitte.  Biceps triceps brachioradialis are 1+ and symmetrical.  No atrophy of the upper extremities.  Isolated supraspinatus  testing is strong no parascapular atrophy.  Severe brachial plexus tenderness left side only.  No shoulder subluxation negative impingement.  Latissimus strength symmetrical.  No thenar atrophy.  No pain with carpal compression on the left or right.   Specialty Comments:  No specialty comments available.  Imaging: No results found.   PMFS History: Patient Active Problem List   Diagnosis Date Noted   Trochanteric bursitis, right hip 09/03/2021   Encounter for screening fecal occult blood testing 01/13/2021   Encounter for well woman exam with routine gynecological exam 01/13/2021   S/P hysterectomy 01/13/2021   Urinary frequency 01/13/2021   Loss of weight 05/20/2020   Abdominal pain 05/20/2020   Trichimoniasis 08/20/2019    Screening examination for STD (sexually transmitted disease) 08/15/2019   Vaginal itching 08/15/2019   Hypertension 08/15/2019   Vitamin D deficiency disease 04/03/2019   Decreased libido 12/25/2018   Angina pectoris (HCC) 10/25/2018   Palpitations 06/06/2018   Chest pain 07/13/2017   Nonspecific chest pain 07/13/2017   Mixed hyperlipidemia 07/13/2017   Precordial chest pain    Hypertensive urgency 06/15/2016   Unstable angina pectoris (HCC) 06/15/2016   Unstable angina (HCC) 06/15/2016   Bilateral breast cysts 12/30/2015   Vaginal discharge 09/09/2015   BV (bacterial vaginosis) 09/09/2015   Shingles 09/09/2015   History of breast cancer 09/09/2015   Syncope and collapse 08/08/2015   Migraine with aura and with status migrainosus, not intractable 08/08/2015   Chronic low back pain 08/08/2015   Insomnia 08/08/2015   Anxiety state 08/08/2015   Panic attacks 08/08/2015   Anemia, iron deficiency 08/08/2015   Pica 08/08/2015   Angina decubitus (HCC) 07/03/2014   Vertebrobasilar artery syndrome 03/06/2014   Encounter for gynecological examination with Papanicolaou smear of cervix 02/04/2014   Syncope 12/14/2013   Cervical disc disorder with radiculopathy of cervical region 02/19/2013   H/O rotator cuff surgery 02/19/2013   Dyspareunia 02/01/2013   Left shoulder pain 01/24/2013   Rotator cuff tear 01/24/2013   Radicular pain 01/24/2013   Labral tear of shoulder 01/24/2013   Complex regional pain syndrome of upper extremity 01/24/2013   Herniated disc, cervical 01/24/2013   Nausea and vomiting 01/18/2013   Rectal bleeding 01/18/2013   Edema of left foot 10/16/2012   Coronary atherosclerosis of native coronary artery    Essential hypertension, benign    HLD (hyperlipidemia)    Tobacco abuse    Past Medical History:  Diagnosis Date   Anemia    Anxiety    Bilateral breast cysts 12/30/2015   Brain tumor (HCC)    Breast cancer (HCC)    Left Breast Cancer   Breast disorder     BV (bacterial vaginosis) 09/09/2015   Coronary atherosclerosis of native coronary artery    a. s/p DES to RCA in 2013 b. DES to LAD in 2014 c. cath in 04/2016 showing patent LAD stent with D2 jailed by LAD stent and CTO of RCA with left to right collaterals present   Cyst of pharynx or nasopharynx    [name]   Decreased libido 12/25/2018   Depression    Diabetes mellitus without complication (HCC)    Essential hypertension    Falls    x 3-4 in past year   GERD (gastroesophageal reflux disease)    Headache    Heart attack (HCC)    x4   History of hematuria    Mixed hyperlipidemia    Personal history of chemotherapy    Left Breast Cancer  Personal history of radiation therapy    Left Breast Cancer   Pre-diabetes    PUD (peptic ulcer disease)    Seizures (HCC)    Shingles 09/09/2015   Stroke (HCC)    12-2017 on Plavix, only deficit is headaches   Suicide attempt (HCC)    3 attempts in remote past   Trichimoniasis 08/20/2019   Treated 08/20/19    Uterine cancer (HCC)    Vitamin D deficiency disease 04/03/2019    Family History  Problem Relation Age of Onset   Colon cancer Paternal Grandfather    Cancer Father    Cancer Maternal Uncle    Alzheimer's disease Paternal Aunt    Cirrhosis Maternal Uncle    Cancer Paternal Aunt        lung   Aneurysm Mother        brain   Heart disease Brother    Heart attack Brother    Mental illness Daughter    ADD / ADHD Daughter    Bipolar disorder Daughter    Mental illness Son    ADD / ADHD Son    Bipolar disorder Son    Mental illness Son    ADD / ADHD Son    Bipolar disorder Son     Past Surgical History:  Procedure Laterality Date   ABDOMINAL HYSTERECTOMY     BIOPSY  11/28/2019   Procedure: BIOPSY;  Surgeon: Malissa Hippo, MD;  Location: [facility];  Service: Endoscopy;;  antral, gastric body    BLADDER SURGERY     BREAST LUMPECTOMY Left    BREAST LUMPECTOMY Right 02/24/2018   Procedure:  RIGHT BREAST LUMPECTOMY ERAS PATHWAY;  Surgeon: Griselda Miner, MD;  Location: [facility] OR;  Service: General;  Laterality: Right;   CARDIAC CATHETERIZATION N/A 05/10/2016   Procedure: Left Heart Cath and Coronary Angiography;  Surgeon: Lyn Records, MD;  Location: [facility] INVASIVE CV LAB;  Service: Cardiovascular;  Laterality: N/A;   CAUTERIZE INNER NOSE  07/13/2020   COLONOSCOPY  May 2010   Fleishman: normal rectum, internal hemorrhoids, , benign colonic polyp   COLONOSCOPY N/A 01/05/2017   Procedure: COLONOSCOPY;  Surgeon: Malissa Hippo, MD;  Location: [facility];  Service: Endoscopy;  Laterality: N/A;  1:00   ESOPHAGEAL DILATION  11/28/2019   Procedure: ESOPHAGEAL DILATION;  Surgeon: Malissa Hippo, MD;  Location: [facility];  Service: Endoscopy;;   ESOPHAGOGASTRODUODENOSCOPY     2001 Dr. Madilyn Fireman: distal esophagitis, small hiatal hernia,. Dr. Katrinka Blazing 2006? no records available currently, pt also reports  EGD a few years ago with Dr. Jena Gauss, do not have these reports anywhere in medical records   ESOPHAGOGASTRODUODENOSCOPY  06/02/2011   WUJ:[id number] pill impaction as described above s/p dilation of a probable cervical esophageal web/bx abnormal esophageal and gastric mucosa. + H.pylori gastritis    ESOPHAGOGASTRODUODENOSCOPY (EGD) WITH PROPOFOL N/A 11/28/2019   Procedure: ESOPHAGOGASTRODUODENOSCOPY (EGD) WITH PROPOFOL;  Surgeon: Malissa Hippo, MD;  Location: [facility];  Service: Endoscopy;  Laterality: N/A;  1020   EYE SURGERY     Removed glass   HAND SURGERY Right    INTRAVASCULAR PRESSURE WIRE/FFR STUDY N/A 10/23/2020   Procedure: INTRAVASCULAR PRESSURE WIRE/FFR STUDY;  Surgeon: Lyn Records, MD;  Location: [facility];  Service: Cardiovascular;  Laterality: N/A;   INTRAVASCULAR PRESSURE WIRE/FFR STUDY N/A 03/30/2021   Procedure: INTRAVASCULAR PRESSURE WIRE/FFR STUDY;  Surgeon: Yvonne Kendall, MD;  Location: [facility] CV LAB;  Service: Cardiovascular;  Laterality:  N/A;   Left  breast lumpectomy     Benign   LEFT HEART CATH AND CORONARY ANGIOGRAPHY N/A 10/25/2018   Procedure: LEFT HEART CATH AND CORONARY ANGIOGRAPHY;  Surgeon: Iran Ouch, MD;  Location: [facility];  Service: Cardiovascular;  Laterality: N/A;   LEFT HEART CATH AND CORONARY ANGIOGRAPHY N/A 10/23/2020   Procedure: LEFT HEART CATH AND CORONARY ANGIOGRAPHY;  Surgeon: Lyn Records, MD;  Location: [facility];  Service: Cardiovascular;  Laterality: N/A;   LEFT HEART CATH AND CORONARY ANGIOGRAPHY N/A 03/30/2021   Procedure: LEFT HEART CATH AND CORONARY ANGIOGRAPHY;  Surgeon: Yvonne Kendall, MD;  Location: [facility];  Service: Cardiovascular;  Laterality: N/A;   LEFT HEART CATHETERIZATION WITH CORONARY ANGIOGRAM N/A 07/03/2014   Procedure: LEFT HEART CATHETERIZATION WITH CORONARY ANGIOGRAM;  Surgeon: Lyn Records, MD;  Location: [facility] CATH LAB;  Service: Cardiovascular;  Laterality: N/A;   POLYPECTOMY  01/05/2017   Procedure: POLYPECTOMY;  Surgeon: Malissa Hippo, MD;  Location: [facility];  Service: Endoscopy;;  colon   SHOULDER SURGERY Left    Social History   Occupational History   Occupation: Retired    Comment: disability  Tobacco Use   Smoking status: Former    Packs/day: 0.20    Years: 32.00    Total pack years: 6.40    Types: Cigarettes, Cigars    Start date: 07/05/1983    Quit date: 04/03/2012    Years since quitting: 9.5   Smokeless tobacco: Never  Vaping Use   Vaping Use: Never used  Substance and Sexual Activity   Alcohol use: No    Alcohol/week: 0.0 standard drinks of alcohol   Drug use: No   Sexual activity: Yes    Birth control/protection: Surgical    Comment: hyst

## 2021-10-05 DIAGNOSIS — M6281 Muscle weakness (generalized): Secondary | ICD-10-CM | POA: Diagnosis not present

## 2021-10-05 DIAGNOSIS — M25559 Pain in unspecified hip: Secondary | ICD-10-CM | POA: Diagnosis not present

## 2021-10-05 DIAGNOSIS — R262 Difficulty in walking, not elsewhere classified: Secondary | ICD-10-CM | POA: Diagnosis not present

## 2021-10-05 DIAGNOSIS — M542 Cervicalgia: Secondary | ICD-10-CM | POA: Diagnosis not present

## 2021-10-07 DIAGNOSIS — M9901 Segmental and somatic dysfunction of cervical region: Secondary | ICD-10-CM | POA: Diagnosis not present

## 2021-10-07 DIAGNOSIS — S233XXA Sprain of ligaments of thoracic spine, initial encounter: Secondary | ICD-10-CM | POA: Diagnosis not present

## 2021-10-07 DIAGNOSIS — S338XXA Sprain of other parts of lumbar spine and pelvis, initial encounter: Secondary | ICD-10-CM | POA: Diagnosis not present

## 2021-10-07 DIAGNOSIS — S134XXA Sprain of ligaments of cervical spine, initial encounter: Secondary | ICD-10-CM | POA: Diagnosis not present

## 2021-10-07 DIAGNOSIS — M542 Cervicalgia: Secondary | ICD-10-CM | POA: Diagnosis not present

## 2021-10-07 DIAGNOSIS — M9903 Segmental and somatic dysfunction of lumbar region: Secondary | ICD-10-CM | POA: Diagnosis not present

## 2021-10-07 DIAGNOSIS — R7989 Other specified abnormal findings of blood chemistry: Secondary | ICD-10-CM | POA: Diagnosis not present

## 2021-10-07 DIAGNOSIS — M9902 Segmental and somatic dysfunction of thoracic region: Secondary | ICD-10-CM | POA: Diagnosis not present

## 2021-10-07 DIAGNOSIS — M25559 Pain in unspecified hip: Secondary | ICD-10-CM | POA: Diagnosis not present

## 2021-10-07 DIAGNOSIS — M6281 Muscle weakness (generalized): Secondary | ICD-10-CM | POA: Diagnosis not present

## 2021-10-07 DIAGNOSIS — R262 Difficulty in walking, not elsewhere classified: Secondary | ICD-10-CM | POA: Diagnosis not present

## 2021-10-12 DIAGNOSIS — R21 Rash and other nonspecific skin eruption: Secondary | ICD-10-CM | POA: Diagnosis not present

## 2021-10-14 ENCOUNTER — Other Ambulatory Visit: Payer: Self-pay | Admitting: Internal Medicine

## 2021-10-14 ENCOUNTER — Other Ambulatory Visit (HOSPITAL_COMMUNITY): Payer: Self-pay | Admitting: Internal Medicine

## 2021-10-14 DIAGNOSIS — M9903 Segmental and somatic dysfunction of lumbar region: Secondary | ICD-10-CM | POA: Diagnosis not present

## 2021-10-14 DIAGNOSIS — M9902 Segmental and somatic dysfunction of thoracic region: Secondary | ICD-10-CM | POA: Diagnosis not present

## 2021-10-14 DIAGNOSIS — M9901 Segmental and somatic dysfunction of cervical region: Secondary | ICD-10-CM | POA: Diagnosis not present

## 2021-10-14 DIAGNOSIS — S134XXA Sprain of ligaments of cervical spine, initial encounter: Secondary | ICD-10-CM | POA: Diagnosis not present

## 2021-10-14 DIAGNOSIS — R262 Difficulty in walking, not elsewhere classified: Secondary | ICD-10-CM | POA: Diagnosis not present

## 2021-10-14 DIAGNOSIS — S233XXA Sprain of ligaments of thoracic spine, initial encounter: Secondary | ICD-10-CM | POA: Diagnosis not present

## 2021-10-14 DIAGNOSIS — M542 Cervicalgia: Secondary | ICD-10-CM | POA: Diagnosis not present

## 2021-10-14 DIAGNOSIS — M6281 Muscle weakness (generalized): Secondary | ICD-10-CM | POA: Diagnosis not present

## 2021-10-14 DIAGNOSIS — S338XXA Sprain of other parts of lumbar spine and pelvis, initial encounter: Secondary | ICD-10-CM | POA: Diagnosis not present

## 2021-10-14 DIAGNOSIS — M25559 Pain in unspecified hip: Secondary | ICD-10-CM | POA: Diagnosis not present

## 2021-10-14 DIAGNOSIS — R160 Hepatomegaly, not elsewhere classified: Secondary | ICD-10-CM

## 2021-10-15 ENCOUNTER — Encounter (HOSPITAL_BASED_OUTPATIENT_CLINIC_OR_DEPARTMENT_OTHER): Payer: Self-pay | Admitting: Family

## 2021-10-15 ENCOUNTER — Ambulatory Visit (INDEPENDENT_AMBULATORY_CARE_PROVIDER_SITE_OTHER): Payer: Medicare Other

## 2021-10-15 ENCOUNTER — Ambulatory Visit (INDEPENDENT_AMBULATORY_CARE_PROVIDER_SITE_OTHER): Payer: Medicare Other | Admitting: Family

## 2021-10-15 VITALS — BP 190/90 | HR 58 | Ht 64.0 in | Wt 157.1 lb

## 2021-10-15 DIAGNOSIS — E785 Hyperlipidemia, unspecified: Secondary | ICD-10-CM

## 2021-10-15 DIAGNOSIS — Z8673 Personal history of transient ischemic attack (TIA), and cerebral infarction without residual deficits: Secondary | ICD-10-CM

## 2021-10-15 DIAGNOSIS — I25118 Atherosclerotic heart disease of native coronary artery with other forms of angina pectoris: Secondary | ICD-10-CM | POA: Diagnosis not present

## 2021-10-15 DIAGNOSIS — R002 Palpitations: Secondary | ICD-10-CM

## 2021-10-15 DIAGNOSIS — I10 Essential (primary) hypertension: Secondary | ICD-10-CM

## 2021-10-15 NOTE — Progress Notes (Unsigned)
Enrolled patient for a 7 day Zio XT monitor to be mailed to patients home   Dr Debara Pickett to read

## 2021-10-15 NOTE — Progress Notes (Signed)
Office Visit    Patient Name: Bethany Wang Date of Encounter: 10/15/2021  PCP:   N[place], MD   [place]  Cardiologist:  Pixie Casino, MD / Dr. Domenic Polite Advanced Practice Provider:  No care team member to display Electrophysiologist:  None      Chief Complaint    Bethany Wang is a 56 y.o. female presents today for preoperative clearance   Past Medical History    Past Medical History:  Diagnosis Date   Anemia    Anxiety    Bilateral breast cysts 12/30/2015   Brain tumor ([place])    Breast cancer ([place])    Left Breast Cancer   Breast disorder    BV (bacterial vaginosis) 09/09/2015   Coronary atherosclerosis of native coronary artery    a. s/p DES to RCA in 2013 b. DES to LAD in 2014 c. cath in 04/2016 showing patent LAD stent with D2 jailed by LAD stent and CTO of RCA with left to right collaterals present   Cyst of pharynx or nasopharynx    [name]   Decreased libido 12/25/2018   Depression    Diabetes mellitus without complication ([place])    Essential hypertension    Falls    x 3-4 in past year   GERD (gastroesophageal reflux disease)    Headache    Heart attack ([place])    x4   History of hematuria    Mixed hyperlipidemia    Personal history of chemotherapy    Left Breast Cancer   Personal history of radiation therapy    Left Breast Cancer   Pre-diabetes    PUD (peptic ulcer disease)    Seizures ([place])    Shingles 09/09/2015   Stroke ([place])    12-2017 on Plavix, only deficit is headaches   Suicide attempt ([place])    3 attempts in remote past   Trichimoniasis 08/20/2019   Treated 08/20/19    Uterine cancer ([place])    Vitamin D deficiency disease 04/03/2019   Past Surgical History:  Procedure Laterality Date   ABDOMINAL HYSTERECTOMY     BIOPSY  11/28/2019   Procedure: BIOPSY;  Surgeon: Rogene Houston, MD;  [facility];  Service: Endoscopy;;  antral, gastric body    BLADDER SURGERY      BREAST LUMPECTOMY Left    BREAST LUMPECTOMY Right 02/24/2018   Procedure: RIGHT BREAST LUMPECTOMY ERAS PATHWAY;  Surgeon: Jovita Kussmaul, MD;  Location: [facility] OR;  Service: General;  Laterality: Right;   CARDIAC CATHETERIZATION N/A 05/10/2016   Procedure: Left Heart Cath and Coronary Angiography;  Surgeon: Belva Crome, MD;  Location: [place] [facility] LAB;  Service: Cardiovascular;  Laterality: N/A;   CAUTERIZE INNER NOSE  07/13/2020   COLONOSCOPY  May 2010   [place]le[place]man: normal rectum, internal hemorrhoids, , benign colonic polyp   COLONOSCOPY N/A 01/05/2017   Procedure: COLONOSCOPY;  Surgeon: Rogene Houston, MD;  [facility];  Service: Endoscopy;  Laterality: N/A;  1:00   ESOPHAGEAL DILATION  11/28/2019   Procedure: ESOPHAGEAL DILATION;  Surgeon: Rogene Houston, MD;  [facility];  Service: Endoscopy;;   ESOPHAGOGASTRODUODENOSCOPY     2001 Dr. Amedeo Plenty: distal esophagitis, small hiatal hernia,. Dr. Tamala Julian 2006? no records available currently, pt also reports  EGD a few years ago with Dr. Gala Romney, do not have these reports anywhere in medical records   ESOPHAGOGASTRODUODENOSCOPY  06/02/2011   EFE:OFHQRF pill impaction as  described above s/p dilation of a probable cervical esophageal web/bx abnormal esophageal and gastric mucosa. + H.pylori gastritis    ESOPHAGOGASTRODUODENOSCOPY (EGD) WITH PROPOFOL N/A 11/28/2019   Procedure: ESOPHAGOGASTRODUODENOSCOPY (EGD) WITH PROPOFOL;  Surgeon: Rogene Houston, MD;  [facility];  Service: Endoscopy;  Laterality: N/A;  1020   EYE SURGERY     Removed glass   HAND SURGERY Right    INTRAVASCULAR PRESSURE WIRE/FFR STUDY N/A 10/23/2020   Procedure: INTRAVASCULAR PRESSURE WIRE/FFR STUDY;  Surgeon: Belva Crome, MD;  Location: [place] [facility] LAB;  Service: Cardiovascular;  Laterality: N/A;   INTRAVASCULAR PRESSURE WIRE/FFR STUDY N/A 03/30/2021   Procedure: INTRAVASCULAR PRESSURE WIRE/FFR STUDY;  Surgeon: Nelva Bush, MD;  Location:  [place] [facility] LAB;  Service: Cardiovascular;  Laterality: N/A;   Left breast lumpectomy     Benign   LEFT HEART CATH AND CORONARY ANGIOGRAPHY N/A 10/25/2018   Procedure: LEFT HEART CATH AND CORONARY ANGIOGRAPHY;  Surgeon: Wellington Hampshire, MD;  Location: [place] [facility] LAB;  Service: Cardiovascular;  Laterality: N/A;   LEFT HEART CATH AND CORONARY ANGIOGRAPHY N/A 10/23/2020   Procedure: LEFT HEART CATH AND CORONARY ANGIOGRAPHY;  Surgeon: Belva Crome, MD;  Location: [place] [facility];  Service: Cardiovascular;  Laterality: N/A;   LEFT HEART CATH AND CORONARY ANGIOGRAPHY N/A 03/30/2021   Procedure: LEFT HEART CATH AND CORONARY ANGIOGRAPHY;  Surgeon: Nelva Bush, MD;  Location: [place] [facility] LAB;  Service: Cardiovascular;  Laterality: N/A;   LEFT HEART CATHETERIZATION WITH CORONARY ANGIOGRAM N/A 07/03/2014   Procedure: LEFT HEART CATHETERIZATION WITH CORONARY ANGIOGRAM;  Surgeon: Belva Crome, MD;  Location: [facility] CATH LAB;  Service: Cardiovascular;  Laterality: N/A;   POLYPECTOMY  01/05/2017   Procedure: POLYPECTOMY;  Surgeon: Rogene Houston, MD;  [facility];  Service: Endoscopy;;  colon   SHOULDER SURGERY Left    Allergies  Allergies  Allergen Reactions   Bee Venom Anaphylaxis and Other (See Comments)    "throat swelled and had to the hospital" - Yellow jacket sting   Lipitor [Atorvastatin] Rash and Other (See Comments)    Liver damage & Upper Abdominal pain, Break out like a rash . Per last hosp visit. It exposed on inpatient stay.   Penicillins Anaphylaxis and Other (See Comments)    Has patient had a PCN reaction causing immediate rash, facial/tongue/throat swelling, SOB or lightheadedness with hypotension: Yes Has patient had a PCN reaction causing severe rash involving mucus membranes or skin necrosis: Yes Has patient had a PCN reaction that required hospitalization Yes Has patient had a PCN reaction occurring within the last 10 years: Yes If all of the above  answers are "NO", then may proceed with Cephalosporin use. Patient states she carries an epi-pen   Shellfish Allergy Swelling   Tomato Itching and Swelling   Valsartan Itching   Zofran [Ondansetron] Itching   Crestor [Rosuvastatin] Rash and Other (See Comments)    Upper Abdominal pain    History of Present Illness    Bethany Wang is a 56 y.o. female with a hx of hyperlipidemia, CAD (s/p DES to RCA 2013, DES to LAD 2014, cath 04/2016 patent LAD stent with jailed D2 by LAD stent and CTO RCA to L-R collaterals), HTN, DM2 last seen 09/04/2021 by Dr. Debara Pickett.  Hospitalized July 2020 with chest pain and underwent LHC with occlusion of RCA with previously placed coronary stent and left-to-right collaterals.  50% ISR of proximal LAD and 90% diagonal ostial narrowing.  Also 60 to 70% obtuse marginal narrowing of 50% distal circumflex disease.  Recommended for medical therapy.  Tolerating statins including atorvastatin and rosuvastatin.  We will transition to [place].  Due to ongoing chest pain repeat catheterization July 2022 occlusion of RCA within previously placed coronary stent with left-to-right collaterals, widely patent left main, proximal LAD 50% ISR RFR across stenosis 0.92, first diagonal 90%, first obtuse marginal 60 to 70% proximal narrowing, distal circumflex 50% narrowing -commended for medical management.  Repeat cardiac catheterization December 2022 for symptoms concerning for unstable angina with moderate to severe multivessel coronary disease similar to prior in July 2022 though FFR negative mid LAD stent in OM1 lesion and chronic total occlusion of proximal RCA again noted.  LVEDP mildly elevated.  She was transition from isosorbide 30 mg 3 times daily to 60 mg twice daily.  Last seen 5/26 and 23.  Notes marked improvement in symptoms.  Had discontinued Ranexa with resolution of her rash.  Presents today for follow-up independently. Initially saw orthopedics for her hip, had injection.  Then was evaluated by same provider for her neck. Prio Xray spondylitic changes. Per her report the orthopedist noted "some sort of knot" above her left scapula with plan for incision requesting carotid duplex prior. This is not mentioned in their note. Notes her PCP has stopped both her thyroid medications - unclear why. Also notes her Amlodipine and HCTZ are on hold at the direction ofher PCP but she is not certain why. Her BP when checked at home has been 120s-130s. It is an arm cuff but has not been checked for accuracy. Notes she was in an argument prior to her office visit today which is why her BP is elevated in clinic today. She notes palpitations at least 3-4 times per week with sensation of heart racing lasting 5-10 minutes both at rest and with activity. Also notes "spots on her liver" with MRI upcoming. Reports no shortness of breath nor dyspnea on exertion. Reports no chest pain, pressure, or tightness. No edema, orthopnea, PND.  Labs reviewed from 09/18/21:  creatinine 0.83, AST 46, ALT 30, Alk phos 145, K 3.8 total cholesterol 123, triglycerides 78, HDL 56, LDL 51 A1c 5.5 THS 4.910  EKGs/Labs/Other Studies Reviewed:   The following studies were reviewed today:  EKG:  EKG is ordered today.  The ekg ordered today demonstrates SB 58 bpm with LVH criteria.   Recent Labs: 10/22/2020: ALT 14; B Natriuretic Peptide 33.8 12/29/2020: Magnesium 2.2 05/22/2021: BUN 7; Creatinine, Ser 0.62; Hemoglobin 13.0; Platelets 276; Potassium 3.3; Sodium 139  Recent Lipid Panel    Component Value Date/Time   CHOL 123 06/08/2021 1016   TRIG 72 06/08/2021 1016   HDL 52 06/08/2021 1016   CHOLHDL 2.4 06/08/2021 1016   VLDL 14 06/08/2021 1016   LDLCALC 57 06/08/2021 1016   LDLCALC 146 (H) 07/03/2020 1334    Home Medications   Current Meds  Medication Sig   albuterol (VENTOLIN HFA) 108 (90 Base) MCG/ACT inhaler Inhale 2 puffs into the lungs every 6 (six) hours as needed for wheezing or shortness of  breath.   ALPRAZolam (XANAX) 0.5 MG tablet Take 1 tablet (0.5 mg total) by mouth 2 (two) times daily as needed for anxiety.   aspirin EC 81 MG tablet Take 1 tablet (81 mg total) by mouth daily.   blood glucose meter kit and supplies KIT Use to check blood sugar daily.   Cholecalciferol (DIALYVITE VITAMIN D 5000) 125 MCG (5000 UT) capsule Take  5,000 Units by mouth daily.   clopidogrel (PLAVIX) 75 MG tablet Take 75 mg by mouth daily.   diphenhydrAMINE (BENADRYL) 25 MG tablet Take 25 mg by mouth every 6 (six) hours as needed for allergies.   escitalopram (LEXAPRO) 20 MG tablet Take 1 tablet (20 mg total) by mouth daily.   Evolocumab (REPATHA SURECLICK [place]) Inject 683 mg into the skin every 14 (fourteen) days.   fluticasone-salmeterol (ADVAIR) 100-50 MCG/ACT AEPB INHALE 1 PUFF BY MOUTH TWICE DAILY (Patient taking differently: Inhale 1 puff into the lungs 2 (two) times daily.)   isosorbide mononitrate (IMDUR) 60 MG 24 hr tablet Take 1 tablet (60 mg total) by mouth 2 (two) times daily.   LINZESS 145 MCG CAPS capsule Take 1 capsule (145 mcg total) by mouth daily.   metoprolol tartrate (LOPRESSOR) 25 MG tablet Take 1 tablet (25 mg total) by mouth 2 (two) times daily.   nitroGLYCERIN (NITROSTAT) 0.4 MG SL tablet DISSOLVE 1 TABLET UNDER THE TONGUE AS NEEDED FOR CHEST PAIN EVERY 5 MINUTES UP TO 3 TIMES. IF NO RELIEF CALL 911. (Patient taking differently: Place 0.4 mg under the tongue every 5 (five) minutes as needed for chest pain.)   prednisoLONE acetate (PRED FORTE) 1 % ophthalmic suspension Place 1 drop into both eyes daily as needed (dry eyes/eye infection symptoms).   thyroid (ARMOUR) 60 MG tablet Take 60 mg by mouth daily before breakfast.   Current Facility-Administered Medications for the 10/15/21 encounter (Office Visit) with Loel Dubonnet, NP  Medication   NONFORMULARY OR COMPOUNDED ITEM 0.2 mL     Review of Systems     All other systems reviewed and are otherwise negative except as noted  above.  Physical Exam    VS:  BP (!) 196/103 (BP Location: Right Arm, Patient Position: Sitting, Cuff Size: Normal)   Pulse 64   Ht _0  (1.626 m)   Wt 157 lb 1.6 oz (71.3 kg)   SpO2 100%   BMI 26.97 kg/m  , BMI Body mass index is 26.97 kg/m.  Wt Readings from Last 3 Encounters:  10/15/21 157 lb 1.6 oz (71.3 kg)  09/11/21 159 lb 6.3 oz (72.3 kg)  09/04/21 159 lb (72.1 kg)    GEN: Well nourished, well developed, in no acute distress. HEENT: normal. Neck: Supple, no JVD, carotid bruits, or masses. Cardiac: RRR, no murmurs, rubs, or gallops. No clubbing, cyanosis, edema.  Radials/PT 2+ and equal bilaterally.  Respiratory:  Respirations regular and unlabored, clear to auscultation bilaterally. GI: Soft, nontender, nondistended. MS: No deformity or atrophy. Skin: Warm and dry, no rash. Neuro:  Strength and sensation are intact. Psych: Normal affect.  Assessment & Plan    CAD - Prior DES RCA 2013, DES LAD 2014. Most recent cath 10/2020 total oclcusion of RCA within previously placed stent with collaterals and LAD prox stent diffuse ISR 50-70% restenosis recommended for medical management. Stable with no anginal symptoms. No indication for ischemic evaluation.  GDMT aspirin, plavix, imdur, repatha. Heart healthy diet and regular cardiovascular exercise encouraged.    Palpitations - Notes palpitations a few times per week. Reports sensation of her heart "stopping". Prior monitor 08/2020 brief PSVT and NSVT.  ZIO placed in clinic. Given baseline bradycardia will defer increasing dose of Metoprolol.  Preop clearance - No formal clearance received. She is without anginal symptoms. However, would prefer to see her BP improved prior to formally granting clearance for surgery. Will send MyChart message in 1 week to check in on BP.  Per her report orthopedist is concerned for carotid stenosis - carotid duplex ordered.   HLD, LDL goal <70 - statin intolerant. On Repatha.   HTN - Not at goal  <130/80. Initial in clinic 203/103 and improved to 190/90 without intervention. She politely declines medication changes today as says her BP is elevated due to an argument prior to her appointment. BP at home 120s-130s. Asked her to bring BP cuff to next clinic to ensure accuracy. Her PCP presently has Amlodipine as well as HCTZ on hold - unclear why - these could be resumed if BP remains elevated. Continue Imdur 44m QD.   Medication management - She is holding mutiple medications at the direction of her PCP. Would be helpful for her to bring her pills to next clinic visit.   Hx of CVA - Continue Repatha, aspirin.   Disposition: Follow up in 2 month(s) with Dr. MDomenic Politeor APP.  Signed, CLoel Dubonnet NP 10/15/2021, 1:41 PM [facility]

## 2021-10-15 NOTE — Patient Instructions (Signed)
Medication Instructions:  Your Physician recommend you continue on your current medication as directed.    *If you need a refill on your cardiac medications before your next appointment, please call your pharmacy*   Lab Work: None ordered today   Testing/Procedures: Your physician has requested that you have a carotid duplex. This test is an ultrasound of the carotid arteries in your neck. It looks at blood flow through these arteries that supply the brain with blood. Allow one hour for this exam. There are no restrictions or special instructions.  Your physician has recommended that you wear a Zio monitor. This will be mailed to you for placement after your MRI.   This monitor is a medical device that records the heart's electrical activity. Doctors most often use these monitors to diagnose arrhythmias. Arrhythmias are problems with the speed or rhythm of the heartbeat. The monitor is a small device applied to your chest. You can wear one while you do your normal daily activities. While wearing this monitor if you have any symptoms to push the button and record what you felt. Once you have worn this monitor for the period of time provider prescribed (Usually 14 days), you will return the monitor device in the postage paid box. Once it is returned they will download the data collected and provide [place] with a report which the provider will then review and we will call you with those results. Important tips:  Avoid showering during the first 24 hours of wearing the monitor. Avoid excessive sweating to help maximize wear time. Do not submerge the device, no hot tubs, and no swimming pools. Keep any lotions or oils away from the patch. After 24 hours you may shower with the patch on. Take brief showers with your back facing the shower head.  Do not remove patch once it has been placed because that will interrupt data and decrease adhesive wear time. Push the button when you have any symptoms and  write down what you were feeling. Once you have completed wearing your monitor, remove and place into box which has postage paid and place in your outgoing mailbox.  If for some reason you have misplaced your box then call our office and we can provide another box and/or mail it off for you.      Follow-Up: At [facility], you and your health needs are our priority.  As part of our continuing mission to provide you with exceptional heart care, we have created designated Provider Care Teams.  These Care Teams include your primary Cardiologist (physician) and Advanced Practice Providers (APPs -  Physician Assistants and Nurse Practitioners) who all work together to provide you with the care you need, when you need it.  We recommend signing up for the patient portal called "MyChart".  Sign up information is provided on this After Visit Summary.  MyChart is used to connect with patients for Virtual Visits (Telemedicine).  Patients are able to view lab/test results, encounter notes, upcoming appointments, etc.  Non-urgent messages can be sent to your provider as well.   To learn more about what you can do with MyChart, go to [email or web address].    Your next appointment:   2 month(s)  The format for your next appointment:   In Person  Provider:   Dr. Domenic Polite or APP {  Other Instructions Heart Healthy Diet Recommendations: A low-salt diet is recommended. Meats should be grilled, baked, or boiled. Avoid fried foods. Focus on lean protein sources like fish or  chicken with vegetables and fruits. The American Heart Association is a Microbiologist!  American Heart Association Diet and Lifeystyle Recommendations   Exercise recommendations: The American Heart Association recommends 150 minutes of moderate intensity exercise weekly. Try 30 minutes of moderate intensity exercise 4-5 times per week. This could include walking, jogging, or swimming.   Important Information About  Sugar

## 2021-10-16 ENCOUNTER — Telehealth (HOSPITAL_BASED_OUTPATIENT_CLINIC_OR_DEPARTMENT_OTHER): Payer: Self-pay | Admitting: Family

## 2021-10-16 ENCOUNTER — Ambulatory Visit (HOSPITAL_COMMUNITY)
Admission: RE | Admit: 2021-10-16 | Discharge: 2021-10-16 | Disposition: A | Discharge status: Home / Self Care | Payer: Medicare Other | Source: Ambulatory Visit | Attending: Orthopaedic Surgery | Admitting: Orthopaedic Surgery

## 2021-10-16 DIAGNOSIS — M9902 Segmental and somatic dysfunction of thoracic region: Secondary | ICD-10-CM | POA: Diagnosis not present

## 2021-10-16 DIAGNOSIS — M9903 Segmental and somatic dysfunction of lumbar region: Secondary | ICD-10-CM | POA: Diagnosis not present

## 2021-10-16 DIAGNOSIS — M5010 Cervical disc disorder with radiculopathy, unspecified cervical region: Secondary | ICD-10-CM | POA: Insufficient documentation

## 2021-10-16 DIAGNOSIS — S233XXA Sprain of ligaments of thoracic spine, initial encounter: Secondary | ICD-10-CM | POA: Diagnosis not present

## 2021-10-16 DIAGNOSIS — S338XXA Sprain of other parts of lumbar spine and pelvis, initial encounter: Secondary | ICD-10-CM | POA: Diagnosis not present

## 2021-10-16 DIAGNOSIS — R262 Difficulty in walking, not elsewhere classified: Secondary | ICD-10-CM | POA: Diagnosis not present

## 2021-10-16 DIAGNOSIS — M9901 Segmental and somatic dysfunction of cervical region: Secondary | ICD-10-CM | POA: Diagnosis not present

## 2021-10-16 DIAGNOSIS — M542 Cervicalgia: Secondary | ICD-10-CM | POA: Diagnosis not present

## 2021-10-16 DIAGNOSIS — M25559 Pain in unspecified hip: Secondary | ICD-10-CM | POA: Diagnosis not present

## 2021-10-16 DIAGNOSIS — M6281 Muscle weakness (generalized): Secondary | ICD-10-CM | POA: Diagnosis not present

## 2021-10-16 DIAGNOSIS — S134XXA Sprain of ligaments of cervical spine, initial encounter: Secondary | ICD-10-CM | POA: Diagnosis not present

## 2021-10-16 NOTE — Telephone Encounter (Signed)
Left message for patient to call and schedule the carotid doppler ordered by Laurann Montana, NP

## 2021-10-17 ENCOUNTER — Encounter (HOSPITAL_BASED_OUTPATIENT_CLINIC_OR_DEPARTMENT_OTHER): Payer: Self-pay | Admitting: Family

## 2021-10-19 NOTE — Telephone Encounter (Signed)
Left message for patient to call and schedule the carotid doppler and 2 month follow  up appointment per the 10/15/21 AVS Laurann Montana, NP)

## 2021-10-20 DIAGNOSIS — R262 Difficulty in walking, not elsewhere classified: Secondary | ICD-10-CM | POA: Diagnosis not present

## 2021-10-20 DIAGNOSIS — M6281 Muscle weakness (generalized): Secondary | ICD-10-CM | POA: Diagnosis not present

## 2021-10-20 DIAGNOSIS — M25559 Pain in unspecified hip: Secondary | ICD-10-CM | POA: Diagnosis not present

## 2021-10-20 DIAGNOSIS — M542 Cervicalgia: Secondary | ICD-10-CM | POA: Diagnosis not present

## 2021-10-22 ENCOUNTER — Encounter (HOSPITAL_BASED_OUTPATIENT_CLINIC_OR_DEPARTMENT_OTHER): Payer: Self-pay

## 2021-10-22 ENCOUNTER — Telehealth: Payer: Self-pay

## 2021-10-22 ENCOUNTER — Ambulatory Visit (HOSPITAL_BASED_OUTPATIENT_CLINIC_OR_DEPARTMENT_OTHER)
Admission: RE | Admit: 2021-10-22 | Discharge: 2021-10-22 | Disposition: A | Discharge status: Home / Self Care | Payer: Medicare Other | Source: Ambulatory Visit | Attending: Internal Medicine | Admitting: Internal Medicine

## 2021-10-22 DIAGNOSIS — K7689 Other specified diseases of liver: Secondary | ICD-10-CM | POA: Diagnosis not present

## 2021-10-22 DIAGNOSIS — R160 Hepatomegaly, not elsewhere classified: Secondary | ICD-10-CM | POA: Diagnosis not present

## 2021-10-22 DIAGNOSIS — M6281 Muscle weakness (generalized): Secondary | ICD-10-CM | POA: Diagnosis not present

## 2021-10-22 DIAGNOSIS — M25559 Pain in unspecified hip: Secondary | ICD-10-CM | POA: Diagnosis not present

## 2021-10-22 DIAGNOSIS — M9902 Segmental and somatic dysfunction of thoracic region: Secondary | ICD-10-CM | POA: Diagnosis not present

## 2021-10-22 DIAGNOSIS — M9901 Segmental and somatic dysfunction of cervical region: Secondary | ICD-10-CM | POA: Diagnosis not present

## 2021-10-22 DIAGNOSIS — S338XXA Sprain of other parts of lumbar spine and pelvis, initial encounter: Secondary | ICD-10-CM | POA: Diagnosis not present

## 2021-10-22 DIAGNOSIS — S233XXA Sprain of ligaments of thoracic spine, initial encounter: Secondary | ICD-10-CM | POA: Diagnosis not present

## 2021-10-22 DIAGNOSIS — M542 Cervicalgia: Secondary | ICD-10-CM | POA: Diagnosis not present

## 2021-10-22 DIAGNOSIS — R262 Difficulty in walking, not elsewhere classified: Secondary | ICD-10-CM | POA: Diagnosis not present

## 2021-10-22 DIAGNOSIS — N281 Cyst of kidney, acquired: Secondary | ICD-10-CM | POA: Diagnosis not present

## 2021-10-22 DIAGNOSIS — M9903 Segmental and somatic dysfunction of lumbar region: Secondary | ICD-10-CM | POA: Diagnosis not present

## 2021-10-22 DIAGNOSIS — S134XXA Sprain of ligaments of cervical spine, initial encounter: Secondary | ICD-10-CM | POA: Diagnosis not present

## 2021-10-22 MED ORDER — GADOBUTROL 1 MMOL/ML IV SOLN
7.1000 mL | Freq: Once | INTRAVENOUS | Status: AC | PRN
  Administered 2021-10-22: 7.1 mL via INTRAVENOUS
  Filled 2021-10-22: qty 7.5

## 2021-10-22 NOTE — Telephone Encounter (Signed)
Patient called stated she had an MRI scan of her neck and was supposed to see Dr Lorin Mercy yesterday ( did not see appointment scheduled) but that Dr Lorin Mercy had called her with her MRI results but missed a call from him. Would like to be called back about MRI results of neck [phone]

## 2021-10-22 NOTE — Telephone Encounter (Signed)
Tried calling. No answer. Will try to call patient again later.

## 2021-10-23 DIAGNOSIS — R002 Palpitations: Secondary | ICD-10-CM

## 2021-10-27 DIAGNOSIS — M25559 Pain in unspecified hip: Secondary | ICD-10-CM | POA: Diagnosis not present

## 2021-10-27 DIAGNOSIS — R262 Difficulty in walking, not elsewhere classified: Secondary | ICD-10-CM | POA: Diagnosis not present

## 2021-10-27 DIAGNOSIS — M9903 Segmental and somatic dysfunction of lumbar region: Secondary | ICD-10-CM | POA: Diagnosis not present

## 2021-10-27 DIAGNOSIS — M9901 Segmental and somatic dysfunction of cervical region: Secondary | ICD-10-CM | POA: Diagnosis not present

## 2021-10-27 DIAGNOSIS — M6281 Muscle weakness (generalized): Secondary | ICD-10-CM | POA: Diagnosis not present

## 2021-10-27 DIAGNOSIS — M542 Cervicalgia: Secondary | ICD-10-CM | POA: Diagnosis not present

## 2021-10-27 DIAGNOSIS — S233XXA Sprain of ligaments of thoracic spine, initial encounter: Secondary | ICD-10-CM | POA: Diagnosis not present

## 2021-10-27 DIAGNOSIS — S134XXA Sprain of ligaments of cervical spine, initial encounter: Secondary | ICD-10-CM | POA: Diagnosis not present

## 2021-10-27 DIAGNOSIS — S338XXA Sprain of other parts of lumbar spine and pelvis, initial encounter: Secondary | ICD-10-CM | POA: Diagnosis not present

## 2021-10-27 DIAGNOSIS — M9902 Segmental and somatic dysfunction of thoracic region: Secondary | ICD-10-CM | POA: Diagnosis not present

## 2021-10-28 ENCOUNTER — Ambulatory Visit (INDEPENDENT_AMBULATORY_CARE_PROVIDER_SITE_OTHER): Payer: Medicare Other

## 2021-10-28 DIAGNOSIS — I25118 Atherosclerotic heart disease of native coronary artery with other forms of angina pectoris: Secondary | ICD-10-CM

## 2021-10-28 DIAGNOSIS — Z8673 Personal history of transient ischemic attack (TIA), and cerebral infarction without residual deficits: Secondary | ICD-10-CM

## 2021-10-29 ENCOUNTER — Telehealth (HOSPITAL_BASED_OUTPATIENT_CLINIC_OR_DEPARTMENT_OTHER): Payer: Self-pay

## 2021-10-29 DIAGNOSIS — M542 Cervicalgia: Secondary | ICD-10-CM | POA: Diagnosis not present

## 2021-10-29 DIAGNOSIS — M25559 Pain in unspecified hip: Secondary | ICD-10-CM | POA: Diagnosis not present

## 2021-10-29 DIAGNOSIS — S233XXA Sprain of ligaments of thoracic spine, initial encounter: Secondary | ICD-10-CM | POA: Diagnosis not present

## 2021-10-29 DIAGNOSIS — M9901 Segmental and somatic dysfunction of cervical region: Secondary | ICD-10-CM | POA: Diagnosis not present

## 2021-10-29 DIAGNOSIS — R262 Difficulty in walking, not elsewhere classified: Secondary | ICD-10-CM | POA: Diagnosis not present

## 2021-10-29 DIAGNOSIS — S338XXA Sprain of other parts of lumbar spine and pelvis, initial encounter: Secondary | ICD-10-CM | POA: Diagnosis not present

## 2021-10-29 DIAGNOSIS — M9903 Segmental and somatic dysfunction of lumbar region: Secondary | ICD-10-CM | POA: Diagnosis not present

## 2021-10-29 DIAGNOSIS — M9902 Segmental and somatic dysfunction of thoracic region: Secondary | ICD-10-CM | POA: Diagnosis not present

## 2021-10-29 DIAGNOSIS — M6281 Muscle weakness (generalized): Secondary | ICD-10-CM | POA: Diagnosis not present

## 2021-10-29 NOTE — Telephone Encounter (Addendum)
Results called to patient who verbalizes understanding!         ----- Message from Loel Dubonnet, NP sent at 10/29/2021 10:37 AM EDT ----- Carotid duplex with no significant plaque build up. Great result!

## 2021-11-02 DIAGNOSIS — M6281 Muscle weakness (generalized): Secondary | ICD-10-CM | POA: Diagnosis not present

## 2021-11-02 DIAGNOSIS — M25559 Pain in unspecified hip: Secondary | ICD-10-CM | POA: Diagnosis not present

## 2021-11-02 DIAGNOSIS — M542 Cervicalgia: Secondary | ICD-10-CM | POA: Diagnosis not present

## 2021-11-02 DIAGNOSIS — R262 Difficulty in walking, not elsewhere classified: Secondary | ICD-10-CM | POA: Diagnosis not present

## 2021-11-03 DIAGNOSIS — M9903 Segmental and somatic dysfunction of lumbar region: Secondary | ICD-10-CM | POA: Diagnosis not present

## 2021-11-03 DIAGNOSIS — S233XXA Sprain of ligaments of thoracic spine, initial encounter: Secondary | ICD-10-CM | POA: Diagnosis not present

## 2021-11-03 DIAGNOSIS — S134XXA Sprain of ligaments of cervical spine, initial encounter: Secondary | ICD-10-CM | POA: Diagnosis not present

## 2021-11-03 DIAGNOSIS — M9901 Segmental and somatic dysfunction of cervical region: Secondary | ICD-10-CM | POA: Diagnosis not present

## 2021-11-03 DIAGNOSIS — R002 Palpitations: Secondary | ICD-10-CM | POA: Diagnosis not present

## 2021-11-03 DIAGNOSIS — M9902 Segmental and somatic dysfunction of thoracic region: Secondary | ICD-10-CM | POA: Diagnosis not present

## 2021-11-03 DIAGNOSIS — S338XXA Sprain of other parts of lumbar spine and pelvis, initial encounter: Secondary | ICD-10-CM | POA: Diagnosis not present

## 2021-11-04 ENCOUNTER — Telehealth (HOSPITAL_BASED_OUTPATIENT_CLINIC_OR_DEPARTMENT_OTHER): Payer: Self-pay

## 2021-11-04 NOTE — Telephone Encounter (Addendum)
RN attempted to call results to patient, reached Bethany Wang (pts. Husband) he is going to have patient call us, call back number given.     ----- Message from Loel Dubonnet, NP sent at 11/03/2021  5:16 PM EDT ----- Monitor with predominantly normal sinus rhythm.  There was 1 run of a fast heart rate in the bottom chambers of heart.  There were 5 short runs of fast heart rate in the top chamber the heart.  Of these episodes she only triggered 1 event on the monitor.  Would recommend continuing metoprolol tartrate 25 mg twice daily as average heart rate is 69 bpm.  She may take an additional half tablet daily as needed for palpitations.

## 2021-11-05 ENCOUNTER — Ambulatory Visit (INDEPENDENT_AMBULATORY_CARE_PROVIDER_SITE_OTHER): Payer: Medicare Other | Admitting: Orthopaedic Surgery

## 2021-11-05 ENCOUNTER — Encounter: Payer: Self-pay | Admitting: Orthopaedic Surgery

## 2021-11-05 VITALS — Ht 64.0 in | Wt 157.0 lb

## 2021-11-05 DIAGNOSIS — D497 Neoplasm of unspecified behavior of endocrine glands and other parts of nervous system: Secondary | ICD-10-CM | POA: Diagnosis not present

## 2021-11-05 DIAGNOSIS — R109 Unspecified abdominal pain: Secondary | ICD-10-CM | POA: Diagnosis not present

## 2021-11-05 DIAGNOSIS — K922 Gastrointestinal hemorrhage, unspecified: Secondary | ICD-10-CM | POA: Diagnosis not present

## 2021-11-05 DIAGNOSIS — I509 Heart failure, unspecified: Secondary | ICD-10-CM | POA: Diagnosis not present

## 2021-11-05 DIAGNOSIS — I2510 Atherosclerotic heart disease of native coronary artery without angina pectoris: Secondary | ICD-10-CM | POA: Diagnosis not present

## 2021-11-05 DIAGNOSIS — I10 Essential (primary) hypertension: Secondary | ICD-10-CM | POA: Diagnosis not present

## 2021-11-05 DIAGNOSIS — M5010 Cervical disc disorder with radiculopathy, unspecified cervical region: Secondary | ICD-10-CM

## 2021-11-05 DIAGNOSIS — T466X5A Adverse effect of antihyperlipidemic and antiarteriosclerotic drugs, initial encounter: Secondary | ICD-10-CM | POA: Diagnosis not present

## 2021-11-05 DIAGNOSIS — E039 Hypothyroidism, unspecified: Secondary | ICD-10-CM | POA: Diagnosis not present

## 2021-11-05 DIAGNOSIS — R531 Weakness: Secondary | ICD-10-CM | POA: Diagnosis not present

## 2021-11-05 DIAGNOSIS — G720 Drug-induced myopathy: Secondary | ICD-10-CM | POA: Diagnosis not present

## 2021-11-05 DIAGNOSIS — R7989 Other specified abnormal findings of blood chemistry: Secondary | ICD-10-CM | POA: Diagnosis not present

## 2021-11-05 DIAGNOSIS — E785 Hyperlipidemia, unspecified: Secondary | ICD-10-CM | POA: Diagnosis not present

## 2021-11-05 NOTE — Progress Notes (Signed)
Office Visit Note   Patient: Bethany Wang           Date of Birth: 05-05-1965           MRN: [id number] Visit Date: 11/05/2021              Requested by: Terra Bella Nation, MD [place],  [place] [id number] PCP: [place] Nation, MD   Assessment & Plan: Visit Diagnoses:  1. Cervical disc disorder with radiculopathy of cervical region     Plan: MRI scan is reviewed.  She has mild degenerative changes without significant compression.  She is making some progress with therapy and I plan to recheck her in 1 to 2 months if she still having symptoms.  Follow-Up Instructions: No follow-ups on file.   Orders:  No orders of the defined types were placed in this encounter.  No orders of the defined types were placed in this encounter.     Procedures: No procedures performed   Clinical Data: No additional findings.   Subjective: Chief Complaint  Patient presents with   Neck - Pain   Left Shoulder - Pain    Would like to have nodule/scar tissue removed. Feels it's causing her neck pain and keeps her up at night    HPI 56 year old female returns with ongoing problems with neck pain that radiates into her left shoulder.  She has had increased symptoms with rotating her neck and tilting to the left.  She had pain on the radial side of the left hand no problems with the right hand.  No bowel bladder symptoms no falling no myelopathic symptoms.  Patient has gone to therapy.  She has noticed some improvement with therapy.  Review of Systems no fever chills no rheumatologic conditions.   Objective: Vital Signs: Ht '5\' 4"'$  (1.626 m)   Wt 157 lb (71.2 kg)   BMI 26.95 kg/m   Physical Exam Constitutional:      Appearance: She is well-developed.  HENT:     Head: Normocephalic.     Right Ear: External ear normal.     Left Ear: External ear normal. There is no impacted cerumen.  Eyes:     Pupils: Pupils are equal, round, and reactive to light.  Neck:     Thyroid:  No thyromegaly.     Trachea: No tracheal deviation.  Cardiovascular:     Rate and Rhythm: Normal rate.  Pulmonary:     Effort: Pulmonary effort is normal.  Abdominal:     Palpations: Abdomen is soft.  Musculoskeletal:     Cervical back: No rigidity.  Skin:    General: Skin is warm and dry.  Neurological:     Mental Status: She is alert and oriented to person, place, and time.  Psychiatric:        Behavior: Behavior normal.     Ortho Exam intact reflexes some brachial plexus tenderness on the left.  Lower extremity reflexes are symmetrical no quad weakness no clonus.  Brachial plexus tenderness on the left negative on the right.  Negative Lhermitte.  No thenar atrophy.  Specialty Comments:  No specialty comments available.  Imaging: Narrative & Impression  CLINICAL DATA:  Neck pain radiating down the left arm for 6 months, atraumatic   EXAM: MRI CERVICAL SPINE WITHOUT CONTRAST   TECHNIQUE: Multiplanar, multisequence MR imaging of the cervical spine was performed. No intravenous contrast was administered.   COMPARISON:  None Available.   FINDINGS: Alignment: Physiologic.  Vertebrae: No fracture, evidence of discitis, or bone lesion.   Cord: Normal signal and morphology.   Posterior Fossa, vertebral arteries, paraspinal tissues: Negative.   Disc levels:   C5-6: Mild disc narrowing and bulging with small endplate and uncovertebral spurs. Patent canal and foramina   C6-7: Mild disc bulging and disc space narrowing.  No impingement   C7-T1:Mild facet spurring on the left.  No neural impingement   IMPRESSION: Overall mild cervical spine degeneration as described. No impingement to explain left arm symptoms.     Electronically Signed   By: Jorje Guild M.D.   On: 10/19/2021 07:02     PMFS History: Patient Active Problem List   Diagnosis Date Noted   Trochanteric bursitis, right hip 09/03/2021   Encounter for screening fecal occult blood testing  01/13/2021   Encounter for well woman exam with routine gynecological exam 01/13/2021   S/P hysterectomy 01/13/2021   Urinary frequency 01/13/2021   Loss of weight 05/20/2020   Abdominal pain 05/20/2020   Trichimoniasis 08/20/2019   Screening examination for STD (sexually transmitted disease) 08/15/2019   Vaginal itching 08/15/2019   Hypertension 08/15/2019   Vitamin D deficiency disease 04/03/2019   Decreased libido 12/25/2018   Angina pectoris ([place]) 10/25/2018   Palpitations 06/06/2018   Chest pain 07/13/2017   Nonspecific chest pain 07/13/2017   Mixed hyperlipidemia 07/13/2017   Precordial chest pain    Hypertensive urgency 06/15/2016   Unstable angina pectoris ([place]) 06/15/2016   Unstable angina ([place]) 06/15/2016   Bilateral breast cysts 12/30/2015   Vaginal discharge 09/09/2015   BV (bacterial vaginosis) 09/09/2015   Shingles 09/09/2015   History of breast cancer 09/09/2015   Syncope and collapse 08/08/2015   Migraine with aura and with status migrainosus, not intractable 08/08/2015   Chronic low back pain 08/08/2015   Insomnia 08/08/2015   Anxiety state 08/08/2015   Panic attacks 08/08/2015   Anemia, iron deficiency 08/08/2015   Pica 08/08/2015   Angina decubitus ([place]) 07/03/2014   Vertebrobasilar artery syndrome 03/06/2014   Encounter for gynecological examination with Papanicolaou smear of cervix 02/04/2014   Syncope 12/14/2013   Cervical disc disorder with radiculopathy of cervical region 02/19/2013   H/O rotator cuff surgery 02/19/2013   Dyspareunia 02/01/2013   Left shoulder pain 01/24/2013   Rotator cuff tear 01/24/2013   Radicular pain 01/24/2013   Labral tear of shoulder 01/24/2013   Complex regional pain syndrome of upper extremity 01/24/2013   Nausea and vomiting 01/18/2013   Rectal bleeding 01/18/2013   Edema of left foot 10/16/2012   Coronary atherosclerosis of native coronary artery    Essential hypertension, benign    HLD (hyperlipidemia)     Tobacco abuse    Past Medical History:  Diagnosis Date   Anemia    Anxiety    Bilateral breast cysts 12/30/2015   Brain tumor ([place])    Breast cancer ([place])    Left Breast Cancer   Breast disorder    BV (bacterial vaginosis) 09/09/2015   Coronary atherosclerosis of native coronary artery    a. s/p DES to RCA in 2013 b. DES to LAD in 2014 c. cath in 04/2016 showing patent LAD stent with D2 jailed by LAD stent and CTO of RCA with left to right collaterals present   Cyst of pharynx or nasopharynx    [name]   Decreased libido 12/25/2018   Depression    Diabetes mellitus without complication [facility])    Essential hypertension    Falls  x 3-4 in past year   GERD (gastroesophageal reflux disease)    Headache    Heart attack ([facility])    x4   History of hematuria    Mixed hyperlipidemia    Personal history of chemotherapy    Left Breast Cancer   Personal history of radiation therapy    Left Breast Cancer   Pre-diabetes    PUD (peptic ulcer disease)    Seizures ([place])    Shingles 09/09/2015   Stroke ([place])    12-2017 on Plavix, only deficit is headaches   Suicide attempt ([place])    3 attempts in remote past   Trichimoniasis 08/20/2019   Treated 08/20/19    Uterine cancer ([place])    Vitamin D deficiency disease 04/03/2019    Family History  Problem Relation Age of Onset   Colon cancer Paternal Grandfather    Cancer Father    Cancer Maternal Uncle    Alzheimer's disease Paternal Aunt    Cirrhosis Maternal Uncle    Cancer Paternal Aunt        lung   Aneurysm Mother        brain   Heart disease Brother    Heart attack Brother    Mental illness Daughter    ADD / ADHD Daughter    Bipolar disorder Daughter    Mental illness Son    ADD / ADHD Son    Bipolar disorder Son    Mental illness Son    ADD / ADHD Son    Bipolar disorder Son     Past Surgical History:  Procedure Laterality Date   ABDOMINAL HYSTERECTOMY     BIOPSY  11/28/2019   Procedure: BIOPSY;   Surgeon: Rogene Houston, MD;  [facility];  Service: Endoscopy;;  antral, gastric body    BLADDER SURGERY     BREAST LUMPECTOMY Left    BREAST LUMPECTOMY Right 02/24/2018   Procedure: RIGHT BREAST LUMPECTOMY ERAS PATHWAY;  Surgeon: Jovita Kussmaul, MD;  Location: [facility] OR;  Service: General;  Laterality: Right;   CARDIAC CATHETERIZATION N/A 05/10/2016   Procedure: Left Heart Cath and Coronary Angiography;  Surgeon: Belva Crome, MD;  Location: [facility];  Service: Cardiovascular;  Laterality: N/A;   CAUTERIZE INNER NOSE  07/13/2020   COLONOSCOPY  May 2010   [place]leishman: normal rectum, internal hemorrhoids, , benign colonic polyp   COLONOSCOPY N/A 01/05/2017   Procedure: COLONOSCOPY;  Surgeon: Rogene Houston, MD;  [facility];  Service: Endoscopy;  Laterality: N/A;  1:00   ESOPHAGEAL DILATION  11/28/2019   Procedure: ESOPHAGEAL DILATION;  Surgeon: Rogene Houston, MD;  [facility];  Service: Endoscopy;;   ESOPHAGOGASTRODUODENOSCOPY     2001 Dr. Amedeo Plenty: distal esophagitis, small hiatal hernia,. Dr. Tamala Julian 2006? no records available currently, pt also reports  EGD a few years ago with Dr. Gala Romney, do not have these reports anywhere in medical records   ESOPHAGOGASTRODUODENOSCOPY  06/02/2011   WER:[id number] pill impaction as described above s/p dilation of a probable cervical esophageal web/bx abnormal esophageal and gastric mucosa. + H.pylori gastritis    ESOPHAGOGASTRODUODENOSCOPY (EGD) WITH PROPOFOL N/A 11/28/2019   Procedure: ESOPHAGOGASTRODUODENOSCOPY (EGD) WITH PROPOFOL;  Surgeon: Rogene Houston, MD;  [facility];  Service: Endoscopy;  Laterality: N/A;  1020   EYE SURGERY     Removed glass   HAND SURGERY Right    INTRAVASCULAR PRESSURE WIRE/FFR STUDY N/A 10/23/2020   Procedure: INTRAVASCULAR PRESSURE WIRE/FFR  STUDY;  Surgeon: Belva Crome, MD;  Location: [facility];  Service: Cardiovascular;  Laterality: N/A;   INTRAVASCULAR PRESSURE  WIRE/FFR STUDY N/A 03/30/2021   Procedure: INTRAVASCULAR PRESSURE WIRE/FFR STUDY;  Surgeon: Nelva Bush, MD;  Location: [facility];  Service: Cardiovascular;  Laterality: N/A;   Left breast lumpectomy     Benign   LEFT HEART CATH AND CORONARY ANGIOGRAPHY N/A 10/25/2018   Procedure: LEFT HEART CATH AND CORONARY ANGIOGRAPHY;  Surgeon: Wellington Hampshire, MD;  Location: [facility];  Service: Cardiovascular;  Laterality: N/A;   LEFT HEART CATH AND CORONARY ANGIOGRAPHY N/A 10/23/2020   Procedure: LEFT HEART CATH AND CORONARY ANGIOGRAPHY;  Surgeon: Belva Crome, MD;  Location: [facility];  Service: Cardiovascular;  Laterality: N/A;   LEFT HEART CATH AND CORONARY ANGIOGRAPHY N/A 03/30/2021   Procedure: LEFT HEART CATH AND CORONARY ANGIOGRAPHY;  Surgeon: Nelva Bush, MD;  Location: [facility];  Service: Cardiovascular;  Laterality: N/A;   LEFT HEART CATHETERIZATION WITH CORONARY ANGIOGRAM N/A 07/03/2014   Procedure: LEFT HEART CATHETERIZATION WITH CORONARY ANGIOGRAM;  Surgeon: Belva Crome, MD;  Location: [facility] C[facility] LAB;  Service: Cardiovascular;  Laterality: N/A;   POLYPECTOMY  01/05/2017   Procedure: POLYPECTOMY;  Surgeon: Rogene Houston, MD;  [facility];  Service: Endoscopy;;  colon   SHOULDER SURGERY Left    Social History   Occupational History   Occupation: Retired    Comment: disability  Tobacco Use   Smoking status: Former    Packs/day: 0.20    Years: 32.00    Total pack years: 6.40    Types: Cigarettes, Cigars    Start date: 07/05/1983    Quit date: 04/03/2012    Years since quitting: 9.6   Smokeless tobacco: Never  Vaping Use   Vaping Use: Never used  Substance and Sexual Activity   Alcohol use: No    Alcohol/week: 0.0 standard drinks of alcohol   Drug use: No   Sexual activity: Yes    Birth control/protection: Surgical    Comment: hyst

## 2021-11-05 NOTE — Telephone Encounter (Signed)
Patient returned call

## 2021-11-05 NOTE — Telephone Encounter (Signed)
Attempted to contact pt.  Left message to call back.  

## 2021-11-06 NOTE — Telephone Encounter (Signed)
Pt returning call. Requesting call back.  

## 2021-11-06 NOTE — Telephone Encounter (Signed)
Pt updated and verbalized understanding.  

## 2021-11-09 ENCOUNTER — Ambulatory Visit (INDEPENDENT_AMBULATORY_CARE_PROVIDER_SITE_OTHER): Payer: Medicare Other | Admitting: Gastroenterology

## 2021-11-10 DIAGNOSIS — S134XXA Sprain of ligaments of cervical spine, initial encounter: Secondary | ICD-10-CM | POA: Diagnosis not present

## 2021-11-10 DIAGNOSIS — M9903 Segmental and somatic dysfunction of lumbar region: Secondary | ICD-10-CM | POA: Diagnosis not present

## 2021-11-10 DIAGNOSIS — S338XXA Sprain of other parts of lumbar spine and pelvis, initial encounter: Secondary | ICD-10-CM | POA: Diagnosis not present

## 2021-11-10 DIAGNOSIS — S233XXA Sprain of ligaments of thoracic spine, initial encounter: Secondary | ICD-10-CM | POA: Diagnosis not present

## 2021-11-10 DIAGNOSIS — M9901 Segmental and somatic dysfunction of cervical region: Secondary | ICD-10-CM | POA: Diagnosis not present

## 2021-11-10 DIAGNOSIS — M9902 Segmental and somatic dysfunction of thoracic region: Secondary | ICD-10-CM | POA: Diagnosis not present

## 2021-11-11 DIAGNOSIS — M6281 Muscle weakness (generalized): Secondary | ICD-10-CM | POA: Diagnosis not present

## 2021-11-11 DIAGNOSIS — M25559 Pain in unspecified hip: Secondary | ICD-10-CM | POA: Diagnosis not present

## 2021-11-11 DIAGNOSIS — R262 Difficulty in walking, not elsewhere classified: Secondary | ICD-10-CM | POA: Diagnosis not present

## 2021-11-11 DIAGNOSIS — M542 Cervicalgia: Secondary | ICD-10-CM | POA: Diagnosis not present

## 2021-11-12 DIAGNOSIS — M9902 Segmental and somatic dysfunction of thoracic region: Secondary | ICD-10-CM | POA: Diagnosis not present

## 2021-11-12 DIAGNOSIS — M9901 Segmental and somatic dysfunction of cervical region: Secondary | ICD-10-CM | POA: Diagnosis not present

## 2021-11-12 DIAGNOSIS — S233XXA Sprain of ligaments of thoracic spine, initial encounter: Secondary | ICD-10-CM | POA: Diagnosis not present

## 2021-11-12 DIAGNOSIS — M9903 Segmental and somatic dysfunction of lumbar region: Secondary | ICD-10-CM | POA: Diagnosis not present

## 2021-11-12 DIAGNOSIS — S338XXA Sprain of other parts of lumbar spine and pelvis, initial encounter: Secondary | ICD-10-CM | POA: Diagnosis not present

## 2021-11-13 DIAGNOSIS — M25559 Pain in unspecified hip: Secondary | ICD-10-CM | POA: Diagnosis not present

## 2021-11-13 DIAGNOSIS — M6281 Muscle weakness (generalized): Secondary | ICD-10-CM | POA: Diagnosis not present

## 2021-11-13 DIAGNOSIS — M542 Cervicalgia: Secondary | ICD-10-CM | POA: Diagnosis not present

## 2021-11-13 DIAGNOSIS — R262 Difficulty in walking, not elsewhere classified: Secondary | ICD-10-CM | POA: Diagnosis not present

## 2021-11-16 DIAGNOSIS — M9902 Segmental and somatic dysfunction of thoracic region: Secondary | ICD-10-CM | POA: Diagnosis not present

## 2021-11-16 DIAGNOSIS — M9901 Segmental and somatic dysfunction of cervical region: Secondary | ICD-10-CM | POA: Diagnosis not present

## 2021-11-16 DIAGNOSIS — S338XXA Sprain of other parts of lumbar spine and pelvis, initial encounter: Secondary | ICD-10-CM | POA: Diagnosis not present

## 2021-11-16 DIAGNOSIS — M9903 Segmental and somatic dysfunction of lumbar region: Secondary | ICD-10-CM | POA: Diagnosis not present

## 2021-11-16 DIAGNOSIS — S233XXA Sprain of ligaments of thoracic spine, initial encounter: Secondary | ICD-10-CM | POA: Diagnosis not present

## 2021-11-17 DIAGNOSIS — M542 Cervicalgia: Secondary | ICD-10-CM | POA: Diagnosis not present

## 2021-11-17 DIAGNOSIS — M6281 Muscle weakness (generalized): Secondary | ICD-10-CM | POA: Diagnosis not present

## 2021-11-17 DIAGNOSIS — R262 Difficulty in walking, not elsewhere classified: Secondary | ICD-10-CM | POA: Diagnosis not present

## 2021-11-17 DIAGNOSIS — M25559 Pain in unspecified hip: Secondary | ICD-10-CM | POA: Diagnosis not present

## 2021-11-20 DIAGNOSIS — M6281 Muscle weakness (generalized): Secondary | ICD-10-CM | POA: Diagnosis not present

## 2021-11-20 DIAGNOSIS — M25559 Pain in unspecified hip: Secondary | ICD-10-CM | POA: Diagnosis not present

## 2021-11-20 DIAGNOSIS — M542 Cervicalgia: Secondary | ICD-10-CM | POA: Diagnosis not present

## 2021-11-20 DIAGNOSIS — R262 Difficulty in walking, not elsewhere classified: Secondary | ICD-10-CM | POA: Diagnosis not present

## 2021-11-24 ENCOUNTER — Ambulatory Visit (INDEPENDENT_AMBULATORY_CARE_PROVIDER_SITE_OTHER): Payer: Medicare Other | Admitting: Diagnostic Neuroimaging

## 2021-11-24 ENCOUNTER — Encounter: Payer: Self-pay | Admitting: Diagnostic Neuroimaging

## 2021-11-24 VITALS — BP 168/103 | HR 69 | Ht 64.0 in | Wt 155.5 lb

## 2021-11-24 DIAGNOSIS — G43101 Migraine with aura, not intractable, with status migrainosus: Secondary | ICD-10-CM | POA: Diagnosis not present

## 2021-11-24 MED ORDER — NURTEC 75 MG PO TBDP: 75.0000 mg | ORAL_TABLET | ORAL | 12 refills | Status: DC

## 2021-11-24 NOTE — Progress Notes (Signed)
[facility]  PATIENT: Bethany Wang DOB: [birth year]  REFERRING CLINICIAN: Erath Nation, MD  HISTORY FROM: patient REASON FOR VISIT: follow up   HISTORICAL  CHIEF COMPLAINT:  Chief Complaint  Patient presents with   Follow-up    She states she had 3 heart attacks, a stroke and has 4 stints since the last time she saw you. She states now when she had headaches she passes out. She is asking if that is apart of the seizures. Room 6 alone    HISTORY OF PRESENT ILLNESS:   UPDATE (11/24/21, VRP): Since last visit, doing well in 2022 from HA, but now more headaches in 2023. Throbbing, sens to light, N/V (3-4 per week). Also had MI, stents in Dec 2022. Had TIA in 2019.   UPDATE (07/16/19, VRP): Since last visit, doing poorly; high stress at home since July 2020. More HA (daily). 4 syncope events at home since July 2020; but did not go to ER. Poor PO intake (eats 1x per day; sometimes does not eat for 2-3 days). Also has poor sleep (stay up all night worrying and praying). No alleviating factors.    UPDATE 11/10/15: Since last visit, had another syncope event at church 2 weeks ago, while sitting down, no warning. Collapsed, hit head, LOC, no seizure activity noted. Taken to [facility]  by 911, spent 1 night in ICU and then d/c home. Still eating 1 time per day (minimal food). Has poor appetite due to severe nausea with eating. Not seen GI in a long time. Still eating red dirt and ice.   UPDATE 08/08/15: Since last visit, no syncope events. Continues with headaches (3-4 per week). Also reports significant pica symptoms (ice and red dirt) and has history of iron deficiency anemia. Has tried iron tabs but could not tolerate.   UPDATE 06/25/15: Since last visit, continues to have episodes of syncope (4 since 2015). First feels sweaty, then LOC. Then wakes up with nausea, headache, pounding, left eye pain, ringing in ears, photophobia, then left arm numbness.  UPDATE  02/13/14: Since last visit, patient here for multiple syncope events. Has had 3 events in the last 2 months. Symptoms start with neck pain, then feeling sweaty, hot sensation, sounds feel like they are in an echo chamber, and then patient collapsed to the ground. No convulsions. Patient is unconscious for 5-10 minutes at a time. She then is able to wake up, with energy drained feeling, sweaty hot feeling. No convulsions. No tongue biting or incontinence. Patient has had some double vision with some of these events.  PRIOR HPI (01/21/12): 56 year old right-handed female with history of hypertension, diabetes, here for evaluation of migraine. Patient reports history of intermittent severe headaches since childhood. Headaches typically start with nausea and vomiting, sensitivity sound, followed by severe frontal headaches. She's been on Friday medications over the years. Nowadays she has to severe migraine days per month. She's tried Topamax but this caused a rash. She's been on amitriptyline since that time to help with migraine control. She also has significant stress in her life with anger issues, and is on Cymbalta, venlafaxine and Depakote. With some of these severe headaches, patient has had episodes where she passes out, falls to the ground and shakes. To the emergency room for evaluation the past. She's also seen by another neurologist, who did EEG, and diagnosed her with nonepileptic spells.   REVIEW OF SYSTEMS: Full 14 system review of systems performed and negative except for: as per HPI.  ALLERGIES: Allergies  Allergen Reactions   Bee Venom Anaphylaxis and Other (See Comments)    "throat swelled and had to the hospital" - Yellow jacket sting   Lipitor [Atorvastatin] Rash and Other (See Comments)    Liver damage & Upper Abdominal pain, Break out like a rash . Per last hosp visit. It exposed on inpatient stay.   Penicillins Anaphylaxis and Other (See Comments)    Has patient had a PCN  reaction causing immediate rash, facial/tongue/throat swelling, SOB or lightheadedness with hypotension: Yes Has patient had a PCN reaction causing severe rash involving mucus membranes or skin necrosis: Yes Has patient had a PCN reaction that required hospitalization Yes Has patient had a PCN reaction occurring within the last 10 years: Yes If all of the above answers are "NO", then may proceed with Cephalosporin use. Patient states she carries an epi-pen   Shellfish Allergy Swelling   Tomato Itching and Swelling   Valsartan Itching   Zofran [Ondansetron] Itching   Crestor [Rosuvastatin] Rash and Other (See Comments)    Upper Abdominal pain    HOME MEDICATIONS: Outpatient Medications Prior to Visit  Medication Sig Dispense Refill   albuterol (VENTOLIN HFA) 108 (90 Base) MCG/ACT inhaler Inhale 2 puffs into the lungs every 6 (six) hours as needed for wheezing or shortness of breath.     ALPRAZolam (XANAX) 0.5 MG tablet Take 1 tablet (0.5 mg total) by mouth 2 (two) times daily as needed for anxiety. 30 tablet 0   amLODipine (NORVASC) 10 MG tablet TAKE 1 TABLET ONCE DAILY. 90 tablet 3   aspirin EC 81 MG tablet Take 1 tablet (81 mg total) by mouth daily. 180 tablet 1   blood glucose meter kit and supplies KIT Use to check blood sugar daily. 1 each 0   Cholecalciferol (DIALYVITE VITAMIN D 5000) 125 MCG (5000 UT) capsule Take 5,000 Units by mouth daily.     clopidogrel (PLAVIX) 75 MG tablet Take 75 mg by mouth daily.     diphenhydrAMINE (BENADRYL) 25 MG tablet Take 25 mg by mouth every 6 (six) hours as needed for allergies.     escitalopram (LEXAPRO) 20 MG tablet Take 1 tablet (20 mg total) by mouth daily. 30 tablet 3   Evolocumab (REPATHA SURECLICK [place]) Inject 678 mg into the skin every 14 (fourteen) days.     fluticasone-salmeterol (ADVAIR) 100-50 MCG/ACT AEPB INHALE 1 PUFF BY MOUTH TWICE DAILY (Patient taking differently: Inhale 1 puff into the lungs 2 (two) times daily.) 60 each 10    HYDROcodone-acetaminophen (NORCO) 10-325 MG tablet Take 1 tablet by mouth every 6 (six) hours as needed.     isosorbide mononitrate (IMDUR) 60 MG 24 hr tablet Take 1 tablet (60 mg total) by mouth 2 (two) times daily. 60 tablet 5   levothyroxine (SYNTHROID) 50 MCG tablet Take 50 mcg by mouth daily.     LINZESS 145 MCG CAPS capsule Take 1 capsule (145 mcg total) by mouth daily. 30 capsule 3   metoprolol tartrate (LOPRESSOR) 25 MG tablet Take 1 tablet (25 mg total) by mouth 2 (two) times daily. 180 tablet 3   nitroGLYCERIN (NITROSTAT) 0.4 MG SL tablet DISSOLVE 1 TABLET UNDER THE TONGUE AS NEEDED FOR CHEST PAIN EVERY 5 MINUTES UP TO 3 TIMES. IF NO RELIEF CALL 911. (Patient taking differently: Place 0.4 mg under the tongue every 5 (five) minutes as needed for chest pain.) 25 tablet 1   prednisoLONE acetate (PRED FORTE) 1 % ophthalmic suspension Place 1 drop into  both eyes daily as needed (dry eyes/eye infection symptoms).     thyroid (ARMOUR) 60 MG tablet Take 60 mg by mouth daily before breakfast.     hydrochlorothiazide (HYDRODIURIL) 12.5 MG tablet Take 12.5 mg by mouth daily. (Patient not taking: Reported on 10/15/2021)     ibuprofen (ADVIL) 200 MG tablet Take 200 mg by mouth every 6 (six) hours as needed. (Patient not taking: Reported on 09/11/2021)     ranolazine (RANEXA) 500 MG 12 hr tablet Take 1 tablet (500 mg total) by mouth 2 (two) times daily. (Patient not taking: Reported on 10/15/2021) 60 tablet 11   Facility-Administered Medications Prior to Visit  Medication Dose Route Frequency Provider Last Rate Last Admin   NONFORMULARY OR COMPOUNDED ITEM 0.2 mL  0.2 mL Intramuscular Q7 days [name], Nimish C, MD   0.2 mL at 10/23/19 1002    PAST MEDICAL HISTORY: Past Medical History:  Diagnosis Date   Anemia    Anxiety    Bilateral breast cysts 12/30/2015   Brain tumor ([place])    Breast cancer ([place])    Left Breast Cancer   Breast disorder    BV (bacterial vaginosis) 09/09/2015   Coronary  atherosclerosis of native coronary artery    a. s/p DES to RCA in 2013 b. DES to LAD in 2014 c. cath in 04/2016 showing patent LAD stent with D2 jailed by LAD stent and CTO of RCA with left to right collaterals present   Cyst of pharynx or nasopharynx    [name]   Decreased libido 12/25/2018   Depression    Diabetes mellitus without complication ([place])    Essential hypertension    Falls    x 3-4 in past year   GERD (gastroesophageal reflux disease)    Headache    Heart attack ([place])    x4   History of hematuria    Mixed hyperlipidemia    Personal history of chemotherapy    Left Breast Cancer   Personal history of radiation therapy    Left Breast Cancer   Pre-diabetes    PUD (peptic ulcer disease)    Seizures ([place])    Shingles 09/09/2015   Stroke ([place])    12-2017 on Plavix, only deficit is headaches   Suicide attempt ([place])    3 attempts in remote past   Trichimoniasis 08/20/2019   Treated 08/20/19    Uterine cancer ([place])    Vitamin D deficiency disease 04/03/2019    PAST SURGICAL HISTORY: Past Surgical History:  Procedure Laterality Date   ABDOMINAL HYSTERECTOMY     BIOPSY  11/28/2019   Procedure: BIOPSY;  Surgeon: Rogene Houston, MD;  [facility];  Service: Endoscopy;;  antral, gastric body    BLADDER SURGERY     BREAST LUMPECTOMY Left    BREAST LUMPECTOMY Right 02/24/2018   Procedure: RIGHT BREAST LUMPECTOMY ERAS PATHWAY;  Surgeon: Jovita Kussmaul, MD;  Location: [facility];  Service: General;  Laterality: Right;   CARDIAC CATHETERIZATION N/A 05/10/2016   Procedure: Left Heart Cath and Coronary Angiography;  Surgeon: Belva Crome, MD;  Location: [place] [facility];  Service: Cardiovascular;  Laterality: N/A;   CAUTERIZE INNER NOSE  07/13/2020   COLONOSCOPY  May 2010   Fleishman: normal rectum, internal hemorrhoids, , benign colonic polyp   COLONOSCOPY N/A 01/05/2017   Procedure: COLONOSCOPY;  Surgeon: Rogene Houston, MD;  [facility];  Service: Endoscopy;  Laterality: N/A;  1:00   ESOPHAGEAL DILATION  11/28/2019   Procedure: ESOPHAGEAL DILATION;  Surgeon: Rogene Houston, MD;  [facility];  Service: Endoscopy;;   ESOPHAGOGASTRODUODENOSCOPY     2001 Dr. Amedeo Plenty: distal esophagitis, small hiatal hernia,. Dr. Tamala Julian 2006? no records available currently, pt also reports  EGD a few years ago with Dr. Gala Romney, do not have these reports anywhere in medical records   ESOPHAGOGASTRODUODENOSCOPY  06/02/2011   VZD:GLOVFI pill impaction as described above s/p dilation of a probable cervical esophageal web/bx abnormal esophageal and gastric mucosa. + H.pylori gastritis    ESOPHAGOGASTRODUODENOSCOPY (EGD) WITH PROPOFOL N/A 11/28/2019   Procedure: ESOPHAGOGASTRODUODENOSCOPY (EGD) WITH PROPOFOL;  Surgeon: Rogene Houston, MD;  [facility];  Service: Endoscopy;  Laterality: N/A;  1020   EYE SURGERY     Removed glass   HAND SURGERY Right    INTRAVASCULAR PRESSURE WIRE/FFR STUDY N/A 10/23/2020   Procedure: INTRAVASCULAR PRESSURE WIRE/FFR STUDY;  Surgeon: Belva Crome, MD;  Location: [place] [facility];  Service: Cardiovascular;  Laterality: N/A;   INTRAVASCULAR PRESSURE WIRE/FFR STUDY N/A 03/30/2021   Procedure: INTRAVASCULAR PRESSURE WIRE/FFR STUDY;  Surgeon: Nelva Bush, MD;  Location: [place] [facility];  Service: Cardiovascular;  Laterality: N/A;   Left breast lumpectomy     Benign   LEFT HEART CATH AND CORONARY ANGIOGRAPHY N/A 10/25/2018   Procedure: LEFT HEART CATH AND CORONARY ANGIOGRAPHY;  Surgeon: Wellington Hampshire, MD;  Location: [place] [facility];  Service: Cardiovascular;  Laterality: N/A;   LEFT HEART CATH AND CORONARY ANGIOGRAPHY N/A 10/23/2020   Procedure: LEFT HEART CATH AND CORONARY ANGIOGRAPHY;  Surgeon: Belva Crome, MD;  Location: [place] [facility];  Service: Cardiovascular;  Laterality: N/A;   LEFT HEART CATH AND CORONARY ANGIOGRAPHY N/A 03/30/2021   Procedure: LEFT HEART CATH AND  CORONARY ANGIOGRAPHY;  Surgeon: Nelva Bush, MD;  Location: [place] [facility];  Service: Cardiovascular;  Laterality: N/A;   LEFT HEART CATHETERIZATION WITH CORONARY ANGIOGRAM N/A 07/03/2014   Procedure: LEFT HEART CATHETERIZATION WITH CORONARY ANGIOGRAM;  Surgeon: Belva Crome, MD;  Location[facility];  Service: Cardiovascular;  Laterality: N/A;   POLYPECTOMY  01/05/2017   Procedure: POLYPECTOMY;  Surgeon: Rogene Houston, MD;  [facility];  Service: Endoscopy;;  colon   SHOULDER SURGERY Left     FAMILY HISTORY: Family History  Problem Relation Age of Onset   Colon cancer Paternal Grandfather    Cancer Father    Cancer Maternal Uncle    Alzheimer's disease Paternal Aunt    Cirrhosis Maternal Uncle    Cancer Paternal Aunt        lung   Aneurysm Mother        brain   Heart disease Brother    Heart attack Brother    Mental illness Daughter    ADD / ADHD Daughter    Bipolar disorder Daughter    Mental illness Son    ADD / ADHD Son    Bipolar disorder Son    Mental illness Son    ADD / ADHD Son    Bipolar disorder Son     SOCIAL HISTORY:  Social History   Socioeconomic History   Marital status: Married    Spouse name: Saralyn Pilar   Number of children: 3   Years of education: Assoc   Highest education level: Not on file  Occupational History   Occupation: Retired    Comment: disability  Tobacco Use   Smoking status: Former  Packs/day: 0.20    Years: 32.00    Total pack years: 6.40    Types: Cigarettes, Cigars    Start date: 07/05/1983    Quit date: 04/03/2012    Years since quitting: 9.6   Smokeless tobacco: Never  Vaping Use   Vaping Use: Never used  Substance and Sexual Activity   Alcohol use: No    Alcohol/week: 0.0 standard drinks of alcohol   Drug use: No   Sexual activity: Yes    Birth control/protection: Surgical    Comment: hyst  Other Topics Concern   Not on file  Social History Narrative   Patient lives at new home with  husband and extended family..On disability since age 17 yrs.   Social Determinants of Health   Financial Resource Strain: Medium Risk (01/13/2021)   Overall Financial Resource Strain (CARDIA)    Difficulty of Paying Living Expenses: Somewhat hard  Food Insecurity: No Food Insecurity (01/13/2021)   Hunger Vital Sign    Worried About Running Out of Food in the Last Year: Never true    Ran Out of Food in the Last Year: Never true  Transportation Needs: No Transportation Needs (01/13/2021)   PRAPARE - Hydrologist (Medical): No    Lack of Transportation (Non-Medical): No  Physical Activity: Sufficiently Active (01/13/2021)   Exercise Vital Sign    Days of Exercise per Week: 7 days    Minutes of Exercise per Session: 60 min  Stress: Stress Concern Present (01/13/2021)   [place]    Feeling of Stress : To some extent  Social Connections: Moderately Isolated (01/13/2021)   Social Connection and Isolation Panel [NHANES]    Frequency of Communication with Friends and Family: Never    Frequency of Social Gatherings with Friends and Family: Never    Attends Religious Services: More than 4 times per year    Active Member of [employer] or Organizations: No    Attends Archivist Meetings: Never    Marital Status: Married  Human resources officer Violence: Not At Risk (01/13/2021)   Humiliation, Afraid, Rape, and Kick questionnaire    Fear of Current or Ex-Partner: No    Emotionally Abused: No    Physically Abused: No    Sexually Abused: No     PHYSICAL EXAM  Vitals:   11/24/21 1412  BP: (!) 168/103  Pulse: 69  Weight: 155 lb 8 oz (70.5 kg)  Height: $Remove'5\' 4"'oioXuJA$  (1.626 m)   Not recorded    No results found.  Body mass index is 26.69 kg/m.   GENERAL EXAM: Patient is in no distress; well developed, nourished and groomed; neck is supple  CARDIOVASCULAR: Regular rate and rhythm, no murmurs, no  carotid bruits  NEUROLOGIC: MENTAL STATUS: awake, alert, language fluent, comprehension intact, naming intact, fund of knowledge appropriate CRANIAL NERVE: no papilledema on fundoscopic exam, pupils equal and reactive to light, visual fields full to confrontation, extraocular muscles intact, no nystagmus, facial sensation and strength symmetric, hearing intact, palate elevates symmetrically, uvula midline, shoulder shrug symmetric, tongue midline. MOTOR: normal bulk and tone, full strength in the BUE, BLE SENSORY: normal and symmetric to light touch and vibration and temperature COORDINATION: finger-nose-finger, fine finger movements normal REFLEXES: deep tendon reflexes present and symmetric GAIT/STATION: LEFT HIP PAIN; LIMPING     DIAGNOSTIC DATA (LABS, IMAGING, TESTING) - I reviewed patient records, labs, notes, testing and imaging myself where available.  Lab Results  Component  Value Date   WBC 7.9 05/22/2021   HGB 13.0 05/22/2021   HCT 38.1 05/22/2021   MCV 83.7 05/22/2021   PLT 276 05/22/2021      Component Value Date/Time   NA 139 05/22/2021 2030   NA 143 03/26/2021 1135   K 3.3 (L) 05/22/2021 2030   CL 105 05/22/2021 2030   CO2 26 05/22/2021 2030   GLUCOSE 95 05/22/2021 2030   BUN 7 05/22/2021 2030   BUN 9 03/26/2021 1135   CREATININE 0.62 05/22/2021 2030   CREATININE 1.01 07/03/2020 1334   CALCIUM 9.4 05/22/2021 2030   PROT 6.8 10/22/2020 1945   PROT 7.0 11/10/2015 0914   ALBUMIN 3.6 10/22/2020 1945   ALBUMIN 4.4 11/10/2015 0914   AST 21 10/22/2020 1945   ALT 14 10/22/2020 1945   ALKPHOS 112 10/22/2020 1945   BILITOT 0.8 10/22/2020 1945   BILITOT 0.2 11/10/2015 0914   GFRNONAA >60 05/22/2021 2030   GFRNONAA 63 07/03/2020 1334   GFRAA 73 07/03/2020 1334   Lab Results  Component Value Date   CHOL 123 06/08/2021   HDL 52 06/08/2021   LDLCALC 57 06/08/2021   TRIG 72 06/08/2021   CHOLHDL 2.4 06/08/2021   Lab Results  Component Value Date   HGBA1C 5.1  10/29/2019   Lab Results  Component Value Date   VITAMINB12 680 11/10/2015   Lab Results  Component Value Date   TSH 1.84 07/03/2020    3/06/11/15 EEG - normal  07/16/15 MRI brain (without) [I reviewed images myself and agree with interpretation. -VRP]  - No acute findings. No change from MRI on 03/11/14.  07/16/15 MRA head [I reviewed images myself and agree with interpretation. -VRP]  - normal   07/16/15 MRA neck (with and without) [I reviewed images myself and agree with interpretation. -VRP]  1. The right vertebral artery is slightly irregular proximally with 30-40% focal stenosis; however there is robust flow signal superiorly up to the vertebrobasilar junction.  2. The left internal carotid artery at the carotid bulb has smooth plaque without focal stenosis.  3. No significant change from MRA neck on 03/11/14.   11/24/15 MRI brain - Unremarkable MRI brain (without). No acute findings. No change from MRI on 07/16/15.      ASSESSMENT AND PLAN  56 y.o. year old female here with:   Dx:  Migraine with aura and with status migrainosus, not intractable    PLAN:  MIGRAINE HEADACHES / TENSION HEADACHES - continue betablocker - intolerant of topiramate - cannot take triptans due to CAD - consider CGRP antagonists (nurtec 75mg  every other day; migraines 12-16 HA per month)   RECURRENT SYNCOPE ATTACKS (11/22/21) - continue aspirin - continue BP control - no driving until event free x 6 months - follow up with PCP and cardiology  RECURRENT NAUSEA / VOMITING - follow up with PCP and GI  PICA (ice, red dirt; unclear etiology) - follow up with PCP  Meds ordered this encounter  Medications   Rimegepant Sulfate (NURTEC) 75 MG TBDP    Sig: Take 75 mg by mouth every other day.    Dispense:  15 tablet    Refill:  12   Return in about 6 months (around 05/27/2022) for with NP Butler Denmark).     Penni Bombard, MD 3/33/5456, 2:56 PM Certified in Neurology,  Neurophysiology and Neuroimaging  [facility] [address], [place], [place] [address] [phone]

## 2021-11-24 NOTE — Patient Instructions (Signed)
Start nurtec '75mg'$  every other day

## 2021-11-25 ENCOUNTER — Telehealth: Payer: Self-pay | Admitting: *Deleted

## 2021-11-25 DIAGNOSIS — S134XXA Sprain of ligaments of cervical spine, initial encounter: Secondary | ICD-10-CM | POA: Diagnosis not present

## 2021-11-25 DIAGNOSIS — M9901 Segmental and somatic dysfunction of cervical region: Secondary | ICD-10-CM | POA: Diagnosis not present

## 2021-11-25 DIAGNOSIS — M542 Cervicalgia: Secondary | ICD-10-CM | POA: Diagnosis not present

## 2021-11-25 DIAGNOSIS — S338XXA Sprain of other parts of lumbar spine and pelvis, initial encounter: Secondary | ICD-10-CM | POA: Diagnosis not present

## 2021-11-25 DIAGNOSIS — M9902 Segmental and somatic dysfunction of thoracic region: Secondary | ICD-10-CM | POA: Diagnosis not present

## 2021-11-25 DIAGNOSIS — R262 Difficulty in walking, not elsewhere classified: Secondary | ICD-10-CM | POA: Diagnosis not present

## 2021-11-25 DIAGNOSIS — M25559 Pain in unspecified hip: Secondary | ICD-10-CM | POA: Diagnosis not present

## 2021-11-25 DIAGNOSIS — S233XXA Sprain of ligaments of thoracic spine, initial encounter: Secondary | ICD-10-CM | POA: Diagnosis not present

## 2021-11-25 DIAGNOSIS — M9903 Segmental and somatic dysfunction of lumbar region: Secondary | ICD-10-CM | POA: Diagnosis not present

## 2021-11-25 DIAGNOSIS — M6281 Muscle weakness (generalized): Secondary | ICD-10-CM | POA: Diagnosis not present

## 2021-11-25 NOTE — Telephone Encounter (Signed)
Nurtec PA, Key: [id number], [id number]. all triptans contraindicated. Your information has been sent to [facility].

## 2021-11-25 NOTE — Telephone Encounter (Signed)
NURTEC TAB '75MG'$  ODT, use as directed, is approved through 04/11/2022 under your Medicare Part D. Approval letter faxed to pharmacy.

## 2021-11-27 DIAGNOSIS — S134XXA Sprain of ligaments of cervical spine, initial encounter: Secondary | ICD-10-CM | POA: Diagnosis not present

## 2021-11-27 DIAGNOSIS — M25559 Pain in unspecified hip: Secondary | ICD-10-CM | POA: Diagnosis not present

## 2021-11-27 DIAGNOSIS — R262 Difficulty in walking, not elsewhere classified: Secondary | ICD-10-CM | POA: Diagnosis not present

## 2021-11-27 DIAGNOSIS — M9902 Segmental and somatic dysfunction of thoracic region: Secondary | ICD-10-CM | POA: Diagnosis not present

## 2021-11-27 DIAGNOSIS — M6281 Muscle weakness (generalized): Secondary | ICD-10-CM | POA: Diagnosis not present

## 2021-11-27 DIAGNOSIS — M9901 Segmental and somatic dysfunction of cervical region: Secondary | ICD-10-CM | POA: Diagnosis not present

## 2021-11-27 DIAGNOSIS — M542 Cervicalgia: Secondary | ICD-10-CM | POA: Diagnosis not present

## 2021-11-27 DIAGNOSIS — S233XXA Sprain of ligaments of thoracic spine, initial encounter: Secondary | ICD-10-CM | POA: Diagnosis not present

## 2021-11-27 DIAGNOSIS — M9903 Segmental and somatic dysfunction of lumbar region: Secondary | ICD-10-CM | POA: Diagnosis not present

## 2021-12-08 DIAGNOSIS — M25559 Pain in unspecified hip: Secondary | ICD-10-CM | POA: Diagnosis not present

## 2021-12-08 DIAGNOSIS — R262 Difficulty in walking, not elsewhere classified: Secondary | ICD-10-CM | POA: Diagnosis not present

## 2021-12-08 DIAGNOSIS — M6281 Muscle weakness (generalized): Secondary | ICD-10-CM | POA: Diagnosis not present

## 2021-12-08 DIAGNOSIS — M542 Cervicalgia: Secondary | ICD-10-CM | POA: Diagnosis not present

## 2021-12-22 ENCOUNTER — Encounter (HOSPITAL_BASED_OUTPATIENT_CLINIC_OR_DEPARTMENT_OTHER): Payer: Self-pay | Admitting: Family

## 2021-12-22 ENCOUNTER — Telehealth: Payer: Self-pay | Admitting: Diagnostic Neuroimaging

## 2021-12-22 ENCOUNTER — Ambulatory Visit (INDEPENDENT_AMBULATORY_CARE_PROVIDER_SITE_OTHER): Payer: Medicare Other | Admitting: Family

## 2021-12-22 VITALS — BP 120/60 | HR 64 | Ht 64.0 in | Wt 153.6 lb

## 2021-12-22 DIAGNOSIS — E785 Hyperlipidemia, unspecified: Secondary | ICD-10-CM | POA: Diagnosis not present

## 2021-12-22 DIAGNOSIS — I25118 Atherosclerotic heart disease of native coronary artery with other forms of angina pectoris: Secondary | ICD-10-CM | POA: Diagnosis not present

## 2021-12-22 DIAGNOSIS — R002 Palpitations: Secondary | ICD-10-CM

## 2021-12-22 DIAGNOSIS — I471 Supraventricular tachycardia: Secondary | ICD-10-CM | POA: Diagnosis not present

## 2021-12-22 MED ORDER — METOPROLOL TARTRATE 25 MG PO TABS: 25.0000 mg | ORAL_TABLET | Freq: Two times a day (BID) | ORAL | 3 refills | Status: DC

## 2021-12-22 NOTE — Telephone Encounter (Signed)
Called patient , no answer and voice MB not set up.

## 2021-12-22 NOTE — Progress Notes (Signed)
Office Visit    Patient Name: Bethany Wang Date of Encounter: 12/22/2021  PCP:  Goodrich Nation, [place]  Cardiologist:  Pixie Casino, MD / Dr. Domenic Polite Advanced Practice Provider:  No care team member to display Electrophysiologist:  None      Chief Complaint    Bethany Wang is a 56 y.o. female presents today for follow up after monitor.  Past Medical History    Past Medical History:  Diagnosis Date   Anemia    Anxiety    Bilateral breast cysts 12/30/2015   Brain tumor ([place])    Breast cancer ([place])    Left Breast Cancer   Breast disorder    BV (bacterial vaginosis) 09/09/2015   Coronary atherosclerosis of native coronary artery    a. s/p DES to RCA in 2013 b. DES to LAD in 2014 c. cath in 04/2016 showing patent LAD stent with D2 jailed by LAD stent and CTO of RCA with left to right collaterals present   Cyst of pharynx or nasopharynx    [name]   Decreased libido 12/25/2018   Depression    Diabetes mellitus without complication ([place])    Essential hypertension    Falls    x 3-4 in past year   GERD (gastroesophageal reflux disease)    Headache    Heart attack ([place])    x4   History of hematuria    Mixed hyperlipidemia    Personal history of chemotherapy    Left Breast Cancer   Personal history of radiation therapy    Left Breast Cancer   Pre-diabetes    PUD (peptic ulcer disease)    Seizures ([place])    Shingles 09/09/2015   Stroke ([place])    12-2017 on Plavix, only deficit is headaches   Suicide attempt ([place])    3 attempts in remote past   Trichimoniasis 08/20/2019   Treated 08/20/19    Uterine cancer ([place])    Vitamin D deficiency disease 04/03/2019   Past Surgical History:  Procedure Laterality Date   ABDOMINAL HYSTERECTOMY     BIOPSY  11/28/2019   Procedure: BIOPSY;  Surgeon: Rogene Houston, MD;  [facility];  Service: Endoscopy;;  antral, gastric body    BLADDER SURGERY      BREAST LUMPECTOMY Left    BREAST LUMPECTOMY Right 02/24/2018   Procedure: RIGHT BREAST LUMPECTOMY ERAS PATHWAY;  Surgeon: Jovita Kussmaul, MD;  Location: [facility] OR;  Service: General;  Laterality: Right;   CARDIAC CATHETERIZATION N/A 05/10/2016   Procedure: Left Heart Cath and Coronary Angiography;  Surgeon: Belva Crome, MD;  Location: [facility];  Service: Cardiovascular;  Laterality: N/A;   CAUTERIZE INNER NOSE  07/13/2020   COLONOSCOPY  May 2010   [place]leishman: normal rectum, internal hemorrhoids, , benign colonic polyp   COLONOSCOPY N/A 01/05/2017   Procedure: COLONOSCOPY;  Surgeon: Rogene Houston, MD;  [facility];  Service: Endoscopy;  Laterality: N/A;  1:00   ESOPHAGEAL DILATION  11/28/2019   Procedure: ESOPHAGEAL DILATION;  Surgeon: Rogene Houston, MD;  [facility];  Service: Endoscopy;;   ESOPHAGOGASTRODUODENOSCOPY     2001 Dr. Amedeo Plenty: distal esophagitis, small hiatal hernia,. Dr. Tamala Julian 2006? no records available currently, pt also reports  EGD a few years ago with Dr. Gala Romney, do not have these reports anywhere in medical records   ESOPHAGOGASTRODUODENOSCOPY  06/02/2011   CLE:XNTZGY pill impaction  as described above s/p dilation of a probable cervical esophageal web/bx abnormal esophageal and gastric mucosa. + H.pylori gastritis    ESOPHAGOGASTRODUODENOSCOPY (EGD) WITH PROPOFOL N/A 11/28/2019   Procedure: ESOPHAGOGASTRODUODENOSCOPY (EGD) WITH PROPOFOL;  Surgeon: Rogene Houston, MD;  [facility];  Service: Endoscopy;  Laterality: N/A;  1020   EYE SURGERY     Removed glass   HAND SURGERY Right    INTRAVASCULAR PRESSURE WIRE/FFR STUDY N/A 10/23/2020   Procedure: INTRAVASCULAR PRESSURE WIRE/FFR STUDY;  Surgeon: Belva Crome, MD;  Location: [facility];  Service: Cardiovascular;  Laterality: N/A;   INTRAVASCULAR PRESSURE WIRE/FFR STUDY N/A 03/30/2021   Procedure: INTRAVASCULAR PRESSURE WIRE/FFR STUDY;  Surgeon: Nelva Bush, MD;  Location:  [facility];  Service: Cardiovascular;  Laterality: N/A;   Left breast lumpectomy     Benign   LEFT HEART CATH AND CORONARY ANGIOGRAPHY N/A 10/25/2018   Procedure: LEFT HEART CATH AND CORONARY ANGIOGRAPHY;  Surgeon: Wellington Hampshire, MD;  Location: [facility];  Service: Cardiovascular;  Laterality: N/A;   LEFT HEART CATH AND CORONARY ANGIOGRAPHY N/A 10/23/2020   Procedure: LEFT HEART CATH AND CORONARY ANGIOGRAPHY;  Surgeon: Belva Crome, MD;  Location: [facility];  Service: Cardiovascular;  Laterality: N/A;   LEFT HEART CATH AND CORONARY ANGIOGRAPHY N/A 03/30/2021   Procedure: LEFT HEART CATH AND CORONARY ANGIOGRAPHY;  Surgeon: Nelva Bush, MD;  Location: [facility];  Service: Cardiovascular;  Laterality: N/A;   LEFT HEART CATHETERIZATION WITH CORONARY ANGIOGRAM N/A 07/03/2014   Procedure: LEFT HEART CATHETERIZATION WITH CORONARY ANGIOGRAM;  Surgeon: Belva Crome, MD;  Location: [facility] LAB;  Service: Cardiovascular;  Laterality: N/A;   POLYPECTOMY  01/05/2017   Procedure: POLYPECTOMY;  Surgeon: Rogene Houston, MD;  [facility];  Service: Endoscopy;;  colon   SHOULDER SURGERY Left    Allergies  Allergies  Allergen Reactions   Bee Venom Anaphylaxis and Other (See Comments)    "throat swelled and had to the hospital" - Yellow jacket sting   Lipitor [Atorvastatin] Rash and Other (See Comments)    Liver damage & Upper Abdominal pain, Break out like a rash . Per last hosp visit. It exposed on inpatient stay.   Penicillins Anaphylaxis and Other (See Comments)    Has patient had a PCN reaction causing immediate rash, facial/tongue/throat swelling, SOB or lightheadedness with hypotension: Yes Has patient had a PCN reaction causing severe rash involving mucus membranes or skin necrosis: Yes Has patient had a PCN reaction that required hospitalization Yes Has patient had a PCN reaction occurring within the last 10 years: Yes If all of the above  answers are "NO", then may proceed with Cephalosporin use. Patient states she carries an epi-pen   Shellfish Allergy Swelling   Tomato Itching and Swelling   Valsartan Itching   Zofran [Ondansetron] Itching   Crestor [Rosuvastatin] Rash and Other (See Comments)    Upper Abdominal pain    History of Present Illness    Bethany Wang is a 56 y.o. female with a hx of hyperlipidemia, CAD (s/p DES to RCA 2013, DES to LAD 2014, cath 04/2016 patent LAD stent with jailed D2 by LAD stent and CTO RCA to L-R collaterals), HTN, DM2 last seen 09/04/2021 by Dr. Debara Pickett.  Hospitalized July 2020 with chest pain and underwent LHC with occlusion of RCA with previously placed coronary stent and left-to-right collaterals.  50% ISR of proximal LAD and 90% diagonal ostial  narrowing.  Also 60 to 70% obtuse marginal narrowing of 50% distal circumflex disease.  Recommended for medical therapy.  Tolerating statins including atorvastatin and rosuvastatin.  We will transition to [place].  Due to ongoing chest pain repeat catheterization July 2022 occlusion of RCA within previously placed coronary stent with left-to-right collaterals, widely patent left main, proximal LAD 50% ISR RFR across stenosis 0.92, first diagonal 90%, first obtuse marginal 60 to 70% proximal narrowing, distal circumflex 50% narrowing -commended for medical management.  Repeat cardiac catheterization December 2022 for symptoms concerning for unstable angina with moderate to severe multivessel coronary disease similar to prior in July 2022 though FFR negative mid LAD stent in OM1 lesion and chronic total occlusion of proximal RCA again noted.  LVEDP mildly elevated.  She was transition from isosorbide 30 mg 3 times daily to 60 mg twice daily.  Last seen 5/26 and 23.  Notes marked improvement in symptoms.  Had discontinued Ranexa with resolution of her rash.  Seen 10/15/21. PCP had stopped both her thyroid medications - unclear why. Noted her Amlodipine and  HCTZ are on hold at the direction ofher PCP but she was not certain why.  Her BP in clinic was 203/103 and then repeat 190/90 but she declined medication changes as she anticipated it was high due to argument prior to office visit.  She noted palpitations and monitor ordered.Monitor with predominantly normal sinus rhythm.  For short beat run of NSVT.  Several short PSVT noted.  Infrequent PVC/PAC.  No atrial fibrillation.  Carotid duplex showed no significant stenosis.  Presents today for follow up. She sees chiropractor once per week and seeing physical therapy twice per week. BP has been well controlled at home. Since last seen doing overall well from cardiac perspective with only occasional palpitations.. Following a mostly vegetarian diet.   Labs reviewed from 09/18/21:  creatinine 0.83, AST 46, ALT 30, Alk phos 145, K 3.8 total cholesterol 123, triglycerides 78, HDL 56, LDL 51 A1c 5.5 THS 4.910  EKGs/Labs/Other Studies Reviewed:   The following studies were reviewed today:  Monitor 11/03/2021 Patch Wear Time:  3 days and 9 hours (2023-07-14T13:09:22-0400 to 2023-07-17T22:28:44-0400)   Patient had a min HR of 45 bpm, max HR of 167 bpm, and avg HR of 69 bpm. Predominant underlying rhythm was Sinus Rhythm. 1 run of Ventricular Tachycardia occurred lasting 4 beats with a max rate of 156 bpm (avg 142 bpm). 5 Supraventricular Tachycardia  runs occurred, the run with the fastest interval lasting 8 beats with a max rate of 167 bpm, the longest lasting 18 beats with an avg rate of 139 bpm. Isolated SVEs were rare (<1.0%), SVE Couplets were rare (<1.0%), and SVE Triplets were rare (<1.0%).  Isolated VEs were occasional (1.1%, 3539), VE Couplets were rare (<1.0%, 12), and no VE Triplets were present. Ventricular Bigeminy and Trigeminy were present. Inverted QRS complexes possibly due to inverted placement of device.   Monitor shows a short 4 beat run of NSVT. Several short PSVT runs were noted.  Infrequent PVC's and PAC's. No afib. EKG:  EKG is not ordered today.   Recent Labs: 12/29/2020: Magnesium 2.2 05/22/2021: BUN 7; Creatinine, Ser 0.62; Hemoglobin 13.0; Platelets 276; Potassium 3.3; Sodium 139  Recent Lipid Panel    Component Value Date/Time   CHOL 123 06/08/2021 1016   TRIG 72 06/08/2021 1016   HDL 52 06/08/2021 1016   CHOLHDL 2.4 06/08/2021 1016   VLDL 14 06/08/2021 1016   LDLCALC 57 06/08/2021 1016  [place] 146 (H) 07/03/2020 1334    Home Medications   No outpatient medications have been marked as taking for the 12/22/21 encounter (Appointment) with Loel Dubonnet, NP.   Current Facility-Administered Medications for the 12/22/21 encounter (Appointment) with Loel Dubonnet, NP  Medication   NONFORMULARY OR COMPOUNDED ITEM 0.2 mL     Review of Systems     All other systems reviewed and are otherwise negative except as noted above.  Physical Exam    VS:  There were no vitals taken for this visit. , BMI There is no height or weight on file to calculate BMI.  Wt Readings from Last 3 Encounters:  11/24/21 155 lb 8 oz (70.5 kg)  11/05/21 157 lb (71.2 kg)  10/15/21 157 lb 1.6 oz (71.3 kg)    GEN: Well nourished, well developed, in no acute distress. HEENT: normal. Neck: Supple, no JVD, carotid bruits, or masses. Cardiac: RRR, no murmurs, rubs, or gallops. No clubbing, cyanosis, edema.  Radials/PT 2+ and equal bilaterally.  Respiratory:  Respirations regular and unlabored, clear to auscultation bilaterally. GI: Soft, nontender, nondistended. MS: No deformity or atrophy. Skin: Warm and dry, no rash. Neuro:  Strength and sensation are intact. Psych: Normal affect.  Assessment & Plan    CAD - Prior DES RCA 2013, DES LAD 2014. Most recent cath 10/2020 total oclcusion of RCA within previously placed stent with collaterals and LAD prox stent diffuse ISR 50-70% restenosis recommended for medical management. Stable with no anginal symptoms. No indication for  ischemic evaluation.  GDMT aspirin, plavix, imdur, repatha. Heart healthy diet and regular cardiovascular exercise encouraged.    Palpitations -monitor 11/03/2021 predominantly normal sinus rhythm, 1 run of VT lasting 4 beats, 5 runs of SVT.  Infrequent PVCs/PAC.  Average heart rate 69 bpm.  Continue metoprolol tartrate 25 mg twice daily.  May take additional half tablet as needed for breakthrough palpitations.  HLD, LDL goal <70 - Statin intolerant. On Repatha.   HTN - BP well controlled. Continue current antihypertensive regimen.    Hx of CVA - Continue Repatha, aspirin.   Disposition: Follow up in 3 month(s) with Dr. Debara Pickett as scheduled  Signed, Loel Dubonnet, NP 12/22/2021, 12:49 PM [place]

## 2021-12-22 NOTE — Patient Instructions (Addendum)
Medication Instructions:  Continue your current medications.   May take an additional half tablet of your Metoprolol as needed for palpitations.   *If you need a refill on your cardiac medications before your next appointment, please call your pharmacy*   Lab Work: None ordered today.   Testing/Procedures: Your monitor showed a very rare episode of a fast heart beat that is not dangerous. Your Metoprolol will help prevent these from happening as often.   Follow-Up: At [facility], you and your health needs are our priority.  As part of our continuing mission to provide you with exceptional heart care, we have created designated Provider Care Teams.  These Care Teams include your primary Cardiologist (physician) and Advanced Practice Providers (APPs -  Physician Assistants and Nurse Practitioners) who all work together to provide you with the care you need, when you need it.  We recommend signing up for the patient portal called "MyChart".  Sign up information is provided on this After Visit Summary.  MyChart is used to connect with patients for Virtual Visits (Telemedicine).  Patients are able to view lab/test results, encounter notes, upcoming appointments, etc.  Non-urgent messages can be sent to your provider as well.   To learn more about what you can do with MyChart, go to [email or web address].    Your next appointment:   November or December to see Dr. Debara Pickett   Other Instructions  To prevent palpitations: Make sure you are adequately hydrated.  Avoid and/or limit caffeine containing beverages like soda or tea. Exercise regularly.  Manage stress well. Some over the counter medications can cause palpitations such as Benadryl, AdvilPM, TylenolPM. Regular Advil or Tylenol do not cause palpitations.    Heart Healthy Diet Recommendations: A low-salt diet is recommended. Meats should be grilled, baked, or boiled. Avoid fried foods. Focus on lean protein sources like fish  or chicken with vegetables and fruits. The American Heart Association is a Microbiologist!  American Heart Association Diet and Lifeystyle Recommendations   Exercise recommendations: The American Heart Association recommends 150 minutes of moderate intensity exercise weekly. Try 30 minutes of moderate intensity exercise 4-5 times per week. This could include walking, jogging, or swimming.

## 2021-12-22 NOTE — Telephone Encounter (Signed)
Patient came into the lobby she said the Nurtec prescribed by Dr. Hadassah Pais made her throw up, made her sugar go up and caused dehydration. She went to the hospital to get some fluid per her primary Dr. Araceli Bouche, She said the hospital advised it was from meds. She stopped taking about a week ago and wanted to talk about it. Thanks

## 2021-12-23 DIAGNOSIS — M25559 Pain in unspecified hip: Secondary | ICD-10-CM | POA: Diagnosis not present

## 2021-12-23 DIAGNOSIS — R262 Difficulty in walking, not elsewhere classified: Secondary | ICD-10-CM | POA: Diagnosis not present

## 2021-12-23 DIAGNOSIS — Z6824 Body mass index (BMI) 24.0-24.9, adult: Secondary | ICD-10-CM | POA: Diagnosis not present

## 2021-12-23 DIAGNOSIS — S8000XA Contusion of unspecified knee, initial encounter: Secondary | ICD-10-CM | POA: Diagnosis not present

## 2021-12-23 DIAGNOSIS — M6281 Muscle weakness (generalized): Secondary | ICD-10-CM | POA: Diagnosis not present

## 2021-12-23 DIAGNOSIS — M542 Cervicalgia: Secondary | ICD-10-CM | POA: Diagnosis not present

## 2021-12-23 DIAGNOSIS — M549 Dorsalgia, unspecified: Secondary | ICD-10-CM | POA: Diagnosis not present

## 2021-12-23 DIAGNOSIS — T148XXA Other injury of unspecified body region, initial encounter: Secondary | ICD-10-CM | POA: Diagnosis not present

## 2021-12-24 ENCOUNTER — Encounter: Payer: Self-pay | Admitting: Orthopaedic Surgery

## 2021-12-24 ENCOUNTER — Ambulatory Visit (INDEPENDENT_AMBULATORY_CARE_PROVIDER_SITE_OTHER): Payer: Medicare Other | Admitting: Orthopaedic Surgery

## 2021-12-24 VITALS — Ht 64.0 in | Wt 153.0 lb

## 2021-12-24 DIAGNOSIS — M9902 Segmental and somatic dysfunction of thoracic region: Secondary | ICD-10-CM | POA: Diagnosis not present

## 2021-12-24 DIAGNOSIS — S233XXA Sprain of ligaments of thoracic spine, initial encounter: Secondary | ICD-10-CM | POA: Diagnosis not present

## 2021-12-24 DIAGNOSIS — S134XXA Sprain of ligaments of cervical spine, initial encounter: Secondary | ICD-10-CM | POA: Diagnosis not present

## 2021-12-24 DIAGNOSIS — M9903 Segmental and somatic dysfunction of lumbar region: Secondary | ICD-10-CM | POA: Diagnosis not present

## 2021-12-24 DIAGNOSIS — Z5321 Procedure and treatment not carried out due to patient leaving prior to being seen by health care provider: Secondary | ICD-10-CM

## 2021-12-24 DIAGNOSIS — M9901 Segmental and somatic dysfunction of cervical region: Secondary | ICD-10-CM | POA: Diagnosis not present

## 2021-12-24 DIAGNOSIS — S338XXA Sprain of other parts of lumbar spine and pelvis, initial encounter: Secondary | ICD-10-CM | POA: Diagnosis not present

## 2021-12-24 NOTE — Progress Notes (Signed)
Pt not seen.

## 2021-12-25 DIAGNOSIS — R262 Difficulty in walking, not elsewhere classified: Secondary | ICD-10-CM | POA: Diagnosis not present

## 2021-12-25 DIAGNOSIS — M25559 Pain in unspecified hip: Secondary | ICD-10-CM | POA: Diagnosis not present

## 2021-12-25 DIAGNOSIS — M542 Cervicalgia: Secondary | ICD-10-CM | POA: Diagnosis not present

## 2021-12-25 DIAGNOSIS — M6281 Muscle weakness (generalized): Secondary | ICD-10-CM | POA: Diagnosis not present

## 2021-12-28 NOTE — Telephone Encounter (Signed)
Contacted pt again, no answer

## 2021-12-29 DIAGNOSIS — R262 Difficulty in walking, not elsewhere classified: Secondary | ICD-10-CM | POA: Diagnosis not present

## 2021-12-29 DIAGNOSIS — M25559 Pain in unspecified hip: Secondary | ICD-10-CM | POA: Diagnosis not present

## 2021-12-29 DIAGNOSIS — M542 Cervicalgia: Secondary | ICD-10-CM | POA: Diagnosis not present

## 2021-12-29 DIAGNOSIS — M6281 Muscle weakness (generalized): Secondary | ICD-10-CM | POA: Diagnosis not present

## 2021-12-31 ENCOUNTER — Encounter: Payer: Self-pay | Admitting: Orthopaedic Surgery

## 2021-12-31 ENCOUNTER — Ambulatory Visit (INDEPENDENT_AMBULATORY_CARE_PROVIDER_SITE_OTHER): Payer: Medicare Other | Admitting: Orthopaedic Surgery

## 2021-12-31 DIAGNOSIS — S8002XA Contusion of left knee, initial encounter: Secondary | ICD-10-CM | POA: Insufficient documentation

## 2021-12-31 NOTE — Progress Notes (Signed)
Office Visit Note   Patient: Bethany Wang           Date of Birth: 07-02-1965           MRN: [id number] Visit Date: 12/31/2021              Requested by: [place] Nation, MD [place],  [place] [id number] PCP: [place] Nation, MD   Assessment & Plan: Visit Diagnoses:  1. Contusion of left knee, initial encounter     Plan: Continue conservative treatment topical cream intermittent ice use the knee sleeve.  She could add a cane to her right hand for ambulation.  Recheck 4 weeks and she will cancel if her symptoms have resolved.  Follow-Up Instructions: Return in about 4 weeks (around 01/28/2022).   Orders:  No orders of the defined types were placed in this encounter.  No orders of the defined types were placed in this encounter.     Procedures: No procedures performed   Clinical Data: No additional findings.   Subjective: Chief Complaint  Patient presents with   Left Knee - Pain    HPI 56 year old female with cardiac history was standing on an air conditioning unit cleaner siding about 2 weeks ago fell and landed on her left knee anteriorly.  She states she had ecchymosis she is on aspirin and Plavix and states when she stepped off the air conditioning that she fell backwards hit her right hip and left knee.  She has used ice Aspercreme use a small knee sleeve.  States swelling is down and most of the ecchymosis have resolved.  She has been ambulating with a limp.  She thinks it might be slightly better since last week.  [facility]  These are negative for acute injury.  No fracture no significant arthritic changes.  Review of Systems positive for CAD hypertension no current chest pain history of tobacco abuse.  All systems noncontributory HPI.   Objective: Vital Signs: Ht '5\' 4"'$  (1.626 m)   Wt 152 lb (68.9 kg)   BMI 26.09 kg/m   Physical Exam Constitutional:      Appearance: She is well-developed.  HENT:     Head:  Normocephalic.     Right Ear: External ear normal.     Left Ear: External ear normal. There is no impacted cerumen.  Eyes:     Pupils: Pupils are equal, round, and reactive to light.  Neck:     Thyroid: No thyromegaly.     Trachea: No tracheal deviation.  Cardiovascular:     Rate and Rhythm: Normal rate.  Pulmonary:     Effort: Pulmonary effort is normal.  Abdominal:     Palpations: Abdomen is soft.  Musculoskeletal:     Cervical back: No rigidity.  Skin:    General: Skin is warm and dry.  Neurological:     Mental Status: She is alert and oriented to person, place, and time.  Psychiatric:        Behavior: Behavior normal.     Ortho Exam no pain with hip range of motion.  No crepitus knee flexion extension right or left.  Trace knee effusion.  Ecchymosis has resolved anteriorly.  She is extremely tender over the patella patellar tendon and tibial tubercle.  Tender over the medial capsule adjacent to the patella.  Normal patellar tracking.  ACL PCL collateral ligament exam is normal no joint line tenderness over the meniscus.  Negative anterior drawer.  Distal pulses are normal.  Specialty Comments:  No specialty comments available.  Imaging: No results found.   PMFS History: Patient Active Problem List   Diagnosis Date Noted   Contusion of left knee 12/31/2021   Trochanteric bursitis, right hip 09/03/2021   Encounter for screening fecal occult blood testing 01/13/2021   Patient left without being seen 01/13/2021   S/P hysterectomy 01/13/2021   Urinary frequency 01/13/2021   Loss of weight 05/20/2020   Abdominal pain 05/20/2020   Trichimoniasis 08/20/2019   Screening examination for STD (sexually transmitted disease) 08/15/2019   Vaginal itching 08/15/2019   Hypertension 08/15/2019   Vitamin D deficiency disease 04/03/2019   Decreased libido 12/25/2018   Angina pectoris ([place]) 10/25/2018   Palpitations 06/06/2018   Chest pain 07/13/2017   Nonspecific chest pain  07/13/2017   Mixed hyperlipidemia 07/13/2017   Precordial chest pain    Hypertensive urgency 06/15/2016   Unstable angina pectoris ([place]) 06/15/2016   Unstable angina ([place]) 06/15/2016   Bilateral breast cysts 12/30/2015   Vaginal discharge 09/09/2015   BV (bacterial vaginosis) 09/09/2015   Shingles 09/09/2015   History of breast cancer 09/09/2015   Syncope and collapse 08/08/2015   Migraine with aura and with status migrainosus, not intractable 08/08/2015   Chronic low back pain 08/08/2015   Insomnia 08/08/2015   Anxiety state 08/08/2015   Panic attacks 08/08/2015   Anemia, iron deficiency 08/08/2015   Pica 08/08/2015   Angina decubitus ([place]) 07/03/2014   Vertebrobasilar artery syndrome 03/06/2014   Encounter for gynecological examination with Papanicolaou smear of cervix 02/04/2014   Syncope 12/14/2013   Cervical disc disorder with radiculopathy of cervical region 02/19/2013   H/O rotator cuff surgery 02/19/2013   Dyspareunia 02/01/2013   Left shoulder pain 01/24/2013   Rotator cuff tear 01/24/2013   Radicular pain 01/24/2013   Labral tear of shoulder 01/24/2013   Complex regional pain syndrome of upper extremity 01/24/2013   Nausea and vomiting 01/18/2013   Rectal bleeding 01/18/2013   Edema of left foot 10/16/2012   Coronary atherosclerosis of native coronary artery    Essential hypertension, benign    HLD (hyperlipidemia)    Tobacco abuse    Past Medical History:  Diagnosis Date   Anemia    Anxiety    Bilateral breast cysts 12/30/2015   Brain tumor ([place])    Breast cancer ([place])    Left Breast Cancer   Breast disorder    BV (bacterial vaginosis) 09/09/2015   Coronary atherosclerosis of native coronary artery    a. s/p DES to RCA in 2013 b. DES to LAD in 2014 c. cath in 04/2016 showing patent LAD stent with D2 jailed by LAD stent and CTO of RCA with left to right collaterals present   Cyst of pharynx or nasopharynx    [name]   Decreased  libido 12/25/2018   Depression    Diabetes mellitus without complication ([place])    Essential hypertension    Falls    x 3-4 in past year   GERD (gastroesophageal reflux disease)    Headache    Heart attack ([place])    x4   History of hematuria    Mixed hyperlipidemia    Personal history of chemotherapy    Left Breast Cancer   Personal history of radiation therapy    Left Breast Cancer   Pre-diabetes    PUD (peptic ulcer disease)    Seizures ([place])    Shingles 09/09/2015   Stroke ([place])  12-2017 on Plavix, only deficit is headaches   Suicide attempt ([place])    3 attempts in remote past   Trichimoniasis 08/20/2019   Treated 08/20/19    Uterine cancer ([place])    Vitamin D deficiency disease 04/03/2019    Family History  Problem Relation Age of Onset   Colon cancer Paternal Grandfather    Cancer Father    Cancer Maternal Uncle    Alzheimer's disease Paternal Aunt    Cirrhosis Maternal Uncle    Cancer Paternal Aunt        lung   Aneurysm Mother        brain   Heart disease Brother    Heart attack Brother    Mental illness Daughter    ADD / ADHD Daughter    Bipolar disorder Daughter    Mental illness Son    ADD / ADHD Son    Bipolar disorder Son    Mental illness Son    ADD / ADHD Son    Bipolar disorder Son     Past Surgical History:  Procedure Laterality Date   ABDOMINAL HYSTERECTOMY     BIOPSY  11/28/2019   Procedure: BIOPSY;  Surgeon: Rogene Houston, MD;  [facility];  Service: Endoscopy;;  antral, gastric body    BLADDER SURGERY     BREAST LUMPECTOMY Left    BREAST LUMPECTOMY Right 02/24/2018   Procedure: RIGHT BREAST LUMPECTOMY ERAS PATHWAY;  Surgeon: Jovita Kussmaul, MD;  [facility];  Service: General;  Laterality: Right;   CARDIAC CATHETERIZATION N/A 05/10/2016   Procedure: Left Heart Cath and Coronary Angiography;  Surgeon: Belva Crome, MD;  Location: [facility];  Service: Cardiovascular;  Laterality: N/A;   CAUTERIZE INNER NOSE   07/13/2020   COLONOSCOPY  May 2010   [place]le[place]: normal rectum, internal hemorrhoids, , benign colonic polyp   COLONOSCOPY N/A 01/05/2017   Procedure: COLONOSCOPY;  Surgeon: Rogene Houston, MD;  [facility];  Service: Endoscopy;  Laterality: N/A;  1:00   ESOPHAGEAL DILATION  11/28/2019   Procedure: ESOPHAGEAL DILATION;  Surgeon: Rogene Houston, MD;  [facility];  Service: Endoscopy;;   ESOPHAGOGASTRODUODENOSCOPY     2001 Dr. Amedeo Plenty: distal esophagitis, small hiatal hernia,. Dr. Tamala Julian 2006? no records available currently, pt also reports  EGD a few years ago with Dr. Gala Romney, do not have these reports anywhere in medical records   ESOPHAGOGASTRODUODENOSCOPY  06/02/2011   [id number] pill impaction as described above s/p dilation of a probable cervical esophageal web/bx abnormal esophageal and gastric mucosa. + H.pylori gastritis    ESOPHAGOGASTRODUODENOSCOPY (EGD) WITH PROPOFOL N/A 11/28/2019   Procedure: ESOPHAGOGASTRODUODENOSCOPY (EGD) WITH PROPOFOL;  Surgeon: Rogene Houston, MD;  [facility];  Service: Endoscopy;  Laterality: N/A;  1020   EYE SURGERY     Removed glass   HAND SURGERY Right    INTRAVASCULAR PRESSURE WIRE/FFR STUDY N/A 10/23/2020   Procedure: INTRAVASCULAR PRESSURE WIRE/FFR STUDY;  Surgeon: Belva Crome, MD;  Location: [facility];  Service: Cardiovascular;  Laterality: N/A;   INTRAVASCULAR PRESSURE WIRE/FFR STUDY N/A 03/30/2021   Procedure: INTRAVASCULAR PRESSURE WIRE/FFR STUDY;  Surgeon: Nelva Bush, MD;  Location: [facility];  Service: Cardiovascular;  Laterality: N/A;   Left breast lumpectomy     Benign   LEFT HEART CATH AND CORONARY ANGIOGRAPHY N/A 10/25/2018   Procedure: LEFT HEART CATH AND CORONARY ANGIOGRAPHY;  Surgeon: [place] Hampshire, MD;  Location:  [facility];  Service: Cardiovascular;  Laterality: N/A;   LEFT HEART CATH AND CORONARY ANGIOGRAPHY N/A 10/23/2020   Procedure: LEFT HEART CATH AND CORONARY  ANGIOGRAPHY;  Surgeon: Belva Crome, MD;  Location: [facility];  Service: Cardiovascular;  Laterality: N/A;   LEFT HEART CATH AND CORONARY ANGIOGRAPHY N/A 03/30/2021   Procedure: LEFT HEART CATH AND CORONARY ANGIOGRAPHY;  Surgeon: Nelva Bush, MD;  Location: [facility];  Service: Cardiovascular;  Laterality: N/A;   LEFT HEART CATHETERIZATION WITH CORONARY ANGIOGRAM N/A 07/03/2014   Procedure: LEFT HEART CATHETERIZATION WITH CORONARY ANGIOGRAM;  Surgeon: Belva Crome, MD;  Location: [facility] CATH LAB;  Service: Cardiovascular;  Laterality: N/A;   POLYPECTOMY  01/05/2017   Procedure: POLYPECTOMY;  Surgeon: Rogene Houston, MD;  [facility];  Service: Endoscopy;;  colon   SHOULDER SURGERY Left    Social History   Occupational History   Occupation: Retired    Comment: disability  Tobacco Use   Smoking status: Former    Packs/day: 0.20    Years: 32.00    Total pack years: 6.40    Types: Cigarettes, Cigars    Start date: 07/05/1983    Quit date: 04/03/2012    Years since quitting: 9.7   Smokeless tobacco: Never  Vaping Use   Vaping Use: Never used  Substance and Sexual Activity   Alcohol use: No    Alcohol/week: 0.0 standard drinks of alcohol   Drug use: No   Sexual activity: Yes    Birth control/protection: Surgical    Comment: hyst

## 2022-01-04 DIAGNOSIS — G720 Drug-induced myopathy: Secondary | ICD-10-CM | POA: Diagnosis not present

## 2022-01-04 DIAGNOSIS — R531 Weakness: Secondary | ICD-10-CM | POA: Diagnosis not present

## 2022-01-04 DIAGNOSIS — I10 Essential (primary) hypertension: Secondary | ICD-10-CM | POA: Diagnosis not present

## 2022-01-04 DIAGNOSIS — R109 Unspecified abdominal pain: Secondary | ICD-10-CM | POA: Diagnosis not present

## 2022-01-04 DIAGNOSIS — D497 Neoplasm of unspecified behavior of endocrine glands and other parts of nervous system: Secondary | ICD-10-CM | POA: Diagnosis not present

## 2022-01-04 DIAGNOSIS — E039 Hypothyroidism, unspecified: Secondary | ICD-10-CM | POA: Diagnosis not present

## 2022-01-04 DIAGNOSIS — M25559 Pain in unspecified hip: Secondary | ICD-10-CM | POA: Diagnosis not present

## 2022-01-04 DIAGNOSIS — I2510 Atherosclerotic heart disease of native coronary artery without angina pectoris: Secondary | ICD-10-CM | POA: Diagnosis not present

## 2022-01-04 DIAGNOSIS — K922 Gastrointestinal hemorrhage, unspecified: Secondary | ICD-10-CM | POA: Diagnosis not present

## 2022-01-04 DIAGNOSIS — E785 Hyperlipidemia, unspecified: Secondary | ICD-10-CM | POA: Diagnosis not present

## 2022-01-04 DIAGNOSIS — R7989 Other specified abnormal findings of blood chemistry: Secondary | ICD-10-CM | POA: Diagnosis not present

## 2022-01-04 DIAGNOSIS — I509 Heart failure, unspecified: Secondary | ICD-10-CM | POA: Diagnosis not present

## 2022-01-07 DIAGNOSIS — M25559 Pain in unspecified hip: Secondary | ICD-10-CM | POA: Diagnosis not present

## 2022-01-07 DIAGNOSIS — M542 Cervicalgia: Secondary | ICD-10-CM | POA: Diagnosis not present

## 2022-01-07 DIAGNOSIS — M6281 Muscle weakness (generalized): Secondary | ICD-10-CM | POA: Diagnosis not present

## 2022-01-07 DIAGNOSIS — R262 Difficulty in walking, not elsewhere classified: Secondary | ICD-10-CM | POA: Diagnosis not present

## 2022-01-11 DIAGNOSIS — M542 Cervicalgia: Secondary | ICD-10-CM | POA: Diagnosis not present

## 2022-01-11 DIAGNOSIS — R262 Difficulty in walking, not elsewhere classified: Secondary | ICD-10-CM | POA: Diagnosis not present

## 2022-01-11 DIAGNOSIS — M25559 Pain in unspecified hip: Secondary | ICD-10-CM | POA: Diagnosis not present

## 2022-01-11 DIAGNOSIS — M6281 Muscle weakness (generalized): Secondary | ICD-10-CM | POA: Diagnosis not present

## 2022-01-13 DIAGNOSIS — R262 Difficulty in walking, not elsewhere classified: Secondary | ICD-10-CM | POA: Diagnosis not present

## 2022-01-13 DIAGNOSIS — M25559 Pain in unspecified hip: Secondary | ICD-10-CM | POA: Diagnosis not present

## 2022-01-13 DIAGNOSIS — M6281 Muscle weakness (generalized): Secondary | ICD-10-CM | POA: Diagnosis not present

## 2022-01-13 DIAGNOSIS — M542 Cervicalgia: Secondary | ICD-10-CM | POA: Diagnosis not present

## 2022-01-18 DIAGNOSIS — M25559 Pain in unspecified hip: Secondary | ICD-10-CM | POA: Diagnosis not present

## 2022-01-18 DIAGNOSIS — M542 Cervicalgia: Secondary | ICD-10-CM | POA: Diagnosis not present

## 2022-01-18 DIAGNOSIS — M6281 Muscle weakness (generalized): Secondary | ICD-10-CM | POA: Diagnosis not present

## 2022-01-18 DIAGNOSIS — R262 Difficulty in walking, not elsewhere classified: Secondary | ICD-10-CM | POA: Diagnosis not present

## 2022-01-20 ENCOUNTER — Other Ambulatory Visit
Admission: RE | Admit: 2022-01-20 | Discharge: 2022-01-20 | Disposition: A | Discharge status: Home / Self Care | Payer: Medicare Other | Source: Ambulatory Visit | Attending: Internal Medicine | Admitting: Internal Medicine

## 2022-01-20 ENCOUNTER — Ambulatory Visit (INDEPENDENT_AMBULATORY_CARE_PROVIDER_SITE_OTHER): Payer: Medicare Other | Admitting: Internal Medicine

## 2022-01-20 ENCOUNTER — Encounter: Payer: Self-pay | Admitting: Internal Medicine

## 2022-01-20 VITALS — BP 190/96 | HR 60 | Ht 64.0 in | Wt 154.0 lb

## 2022-01-20 DIAGNOSIS — I200 Unstable angina: Secondary | ICD-10-CM

## 2022-01-20 DIAGNOSIS — Z01818 Encounter for other preprocedural examination: Secondary | ICD-10-CM

## 2022-01-20 DIAGNOSIS — M6281 Muscle weakness (generalized): Secondary | ICD-10-CM | POA: Diagnosis not present

## 2022-01-20 DIAGNOSIS — E785 Hyperlipidemia, unspecified: Secondary | ICD-10-CM

## 2022-01-20 DIAGNOSIS — R262 Difficulty in walking, not elsewhere classified: Secondary | ICD-10-CM | POA: Diagnosis not present

## 2022-01-20 DIAGNOSIS — I25118 Atherosclerotic heart disease of native coronary artery with other forms of angina pectoris: Secondary | ICD-10-CM | POA: Diagnosis not present

## 2022-01-20 DIAGNOSIS — M25559 Pain in unspecified hip: Secondary | ICD-10-CM | POA: Diagnosis not present

## 2022-01-20 DIAGNOSIS — M542 Cervicalgia: Secondary | ICD-10-CM | POA: Diagnosis not present

## 2022-01-20 DIAGNOSIS — R002 Palpitations: Secondary | ICD-10-CM

## 2022-01-20 LAB — BASIC METABOLIC PANEL
Anion gap: 7 (ref 5–15)
BUN: 6 mg/dL (ref 6–20)
CO2: 26 mmol/L (ref 22–32)
Calcium: 9.6 mg/dL (ref 8.9–10.3)
Chloride: 109 mmol/L (ref 98–111)
Creatinine, Ser: 0.77 mg/dL (ref 0.44–1.00)
GFR, Estimated: >60 mL/min (ref >60)
Glucose, Bld: 85 mg/dL (ref 70–99)
Potassium: 3.2 mmol/L — ABNORMAL LOW (ref 3.5–5.1)
Sodium: 142 mmol/L (ref 135–145)

## 2022-01-20 LAB — LIPID PANEL
Cholesterol: 153 mg/dL (ref 0–200)
HDL: 74 mg/dL (ref >40)
LDL Cholesterol: 62 mg/dL (ref 0–99)
Total CHOL/HDL Ratio: 2.1 RATIO
Triglycerides: 85 mg/dL (ref <150)
VLDL: 17 mg/dL (ref 0–40)

## 2022-01-20 LAB — CBC
HCT: 43.3 % (ref 36.0–46.0)
Hemoglobin: 13.9 g/dL (ref 12.0–15.0)
MCH: 27.5 pg (ref 26.0–34.0)
MCHC: 32.1 g/dL (ref 30.0–36.0)
MCV: 85.7 fL (ref 80.0–100.0)
Platelets: 251 10*3/uL (ref 150–400)
RBC: 5.05 MIL/uL (ref 3.87–5.11)
RDW: 14.6 % (ref 11.5–15.5)
WBC: 6.6 10*3/uL (ref 4.0–10.5)
nRBC: 0 % (ref 0.0–0.2)

## 2022-01-20 NOTE — Progress Notes (Signed)
  Office Note  Chief Complaint:  No complaints  Primary Care Physician: Williams, Gordon F, MD  Primary Cardiologist:  Atiyana Welte C Edell Mesenbrink, MD  HPI:  Bethany Wang is a 55 y.o. female who is being seen today for the evaluation of dyslipidemia at the request of Williams, Gordon F, MD. This is a pleasant 55-year-old female that I do not recall seeing in the hospital but she remembers seeing me with a recent hospitalization.  She was hospitalized in July with chest pain and underwent left heart catheterization.  This demonstrated occlusion of the right coronary artery with the previously placed coronary stent and left to right collaterals.  There was a 50% in-stent restenosis of the proximal LAD and 90% diagonal ostial narrowing.  There is also 60 to 70% obtuse marginal narrowing and 50% distal circumflex disease.  Medical therapy was recommended.  She was referred today for management of dyslipidemia.  Unfortunately she was intolerant of statins including atorvastatin and rosuvastatin, both of which caused significant side effects.  Subsequently she had been switched by her primary care provider to Repatha and has already had a dose of the medication.  It is too early to look for response to this medication however I agree with this therapy.  More concerning, however today is symptoms of progressive chest pain.  She reports that ever since this past summer she has had worsening chest pain despite long-acting nitroglycerin.  She has been using short acting nitroglycerin very frequently and gets some minimal relief however it is short-lived.  EKG today shows normal sinus rhythm with some nonspecific T wave changes.  06/08/2021  Bethany Wang returns today for recurrent chest pain.  She has been in the emergency department twice this year including the other week with chest pain.  She said she had an episode when she was at church including diaphoresis, shortness of breath and left arm pain.  She took  nitroglycerin and it took several minutes to up to an hour to improve.  She also notes she gets short of breath and has chest discomfort when exerting herself.  She says she gets chest discomfort working in her garden.  I had sent her for cardiac catheterization in December 2022 for symptoms concerning for unstable angina.  Demonstrated moderate to severe multivessel coronary disease, similar to prior cath in July 2022 however FFR was negative of the mid LAD stent in OM1 lesion and a chronic total occlusion of the proximal RCA stent was again noted.  LVEDP was mildly elevated.  More antianginal therapy was recommended.  Initially she was switched from isosorbide 30 mg 3 times daily to 60 mg twice daily.  She says initially this helped somewhat but since then her symptoms have worsened.  She is now taking sublingual nitro almos  09/04/2021  Bethany Wang is seen today in follow-up.  She reports marked improvement in her symptoms.  Blood pressure is now much better controlled at 124/76.  It turns out she had a rash that was attributable to the Ranexa.  She stopped that.  She had been switched to Repatha which she is tolerating very well and has had a significant improvement in her cholesterol.  In February showed total cholesterol 123 (down from 216), triglycerides 72, HDL 52 and LDL 57.  Overall she is very pleased with how she is feeling.  She is still struggling with bilateral hip pain and has been working with Dr. Gates who is contemplating sequential, bilateral hip replacement.  01/20/2022    Bethany Wang was an acute add-on visit today.  Apparently she was seen this morning while doing rehab by her primary care physician who is next-door.  She was noted to be tachycardic and in talking with her PCP he felt that she needed to be in the hospital.  Her blood pressure was quite elevated.  This had upset her.  She contacted [place] and was able to get in to an appointment today.  In speaking with her, she says that  she has had significant increase in the need for nitroglycerin over the past several weeks.  In fact she is taking it about every other day in addition to her long-acting nitrates.  Her last heart catheterization in 2022 did show moderate to severe stenosis of the LAD (in-stent), and there is chronic total occlusion of the proximal RCA stent with left to right collaterals.  FFR was nonsignificant and no intervention was performed.  Increased antianginal therapy was recommended.  Despite this, it seems that her symptoms continue to worsen.  She has had intermittent episodes of tachycardia.  She had monitoring over the summer which showed some episodes of nonsustained VT.  Her beta-blocker was titrated.  PMHx:  Past Medical History:  Diagnosis Date   Anemia    Anxiety    Bilateral breast cysts 12/30/2015   Brain tumor ([place])    Breast cancer ([place])    Left Breast Cancer   Breast disorder    BV (bacterial vaginosis) 09/09/2015   Coronary atherosclerosis of native coronary artery    a. s/p DES to RCA in 2013 b. DES to LAD in 2014 c. cath in 04/2016 showing patent LAD stent with D2 jailed by LAD stent and CTO of RCA with left to right collaterals present   Cyst of pharynx or nasopharynx    [name]   Decreased libido 12/25/2018   Depression    Diabetes mellitus without complication ([place])    Essential hypertension    Falls    x 3-4 in past year   GERD (gastroesophageal reflux disease)    Headache    Heart attack ([place])    x4   History of hematuria    Mixed hyperlipidemia    Personal history of chemotherapy    Left Breast Cancer   Personal history of radiation therapy    Left Breast Cancer   Pre-diabetes    PUD (peptic ulcer disease)    Seizures ([place])    Shingles 09/09/2015   Stroke ([place])    12-2017 on Plavix, only deficit is headaches   Suicide attempt ([facility])    3 attempts in remote past   Trichimoniasis 08/20/2019   Treated 08/20/19    Uterine cancer ([place])     Vitamin D deficiency disease 04/03/2019    Past Surgical History:  Procedure Laterality Date   ABDOMINAL HYSTERECTOMY     BIOPSY  11/28/2019   Procedure: BIOPSY;  Surgeon: Rogene Houston, MD;  [facility];  Service: Endoscopy;;  antral, gastric body    BLADDER SURGERY     BREAST LUMPECTOMY Left    BREAST LUMPECTOMY Right 02/24/2018   Procedure: RIGHT BREAST LUMPECTOMY ERAS PATHWAY;  Surgeon: Jovita Kussmaul, MD;  Location: [facility] OR;  Service: General;  Laterality: Right;   CARDIAC CATHETERIZATION N/A 05/10/2016   Procedure: Left Heart Cath and Coronary Angiography;  Surgeon: Belva Crome, MD;  Location: [facility];  Service: Cardiovascular;  Laterality: N/A;   CAUTERIZE INNER NOSE  07/13/2020  COLONOSCOPY  May 2010   Fleishman: normal rectum, internal hemorrhoids, , benign colonic polyp   COLONOSCOPY N/A 01/05/2017   Procedure: COLONOSCOPY;  Surgeon: Rehman, Najeeb U, MD;  [facility]: [facility];  Service: Endoscopy;  Laterality: N/A;  1:00   ESOPHAGEAL DILATION  11/28/2019   Procedure: ESOPHAGEAL DILATION;  Surgeon: Rehman, Najeeb U, MD;  [facility];  Service: Endoscopy;;   ESOPHAGOGASTRODUODENOSCOPY     2001 Dr. Hayes: distal esophagitis, small hiatal hernia,. Dr. Smith 2006? no records available currently, pt also reports  EGD a few years ago with Dr. Rourk, do not have these reports anywhere in medical records   ESOPHAGOGASTRODUODENOSCOPY  06/02/2011   RMR:Recent pill impaction as described above s/p dilation of a probable cervical esophageal web/bx abnormal esophageal and gastric mucosa. + H.pylori gastritis    ESOPHAGOGASTRODUODENOSCOPY (EGD) WITH PROPOFOL N/A 11/28/2019   Procedure: ESOPHAGOGASTRODUODENOSCOPY (EGD) WITH PROPOFOL;  Surgeon: Rehman, Najeeb U, MD;  [facility];  Service: Endoscopy;  Laterality: N/A;  1020   EYE SURGERY     Removed glass   HAND SURGERY Right    INTRAVASCULAR PRESSURE WIRE/FFR STUDY N/A 10/23/2020   Procedure:  INTRAVASCULAR PRESSURE WIRE/FFR STUDY;  Surgeon: Smith, Henry W, MD;  [facility];  Service: Cardiovascular;  Laterality: N/A;   INTRAVASCULAR PRESSURE WIRE/FFR STUDY N/A 03/30/2021   Procedure: INTRAVASCULAR PRESSURE WIRE/FFR STUDY;  Surgeon: End, Christopher, MD;  [facility];  Service: Cardiovascular;  Laterality: N/A;   Left breast lumpectomy     Benign   LEFT HEART CATH AND CORONARY ANGIOGRAPHY N/A 10/25/2018   Procedure: LEFT HEART CATH AND CORONARY ANGIOGRAPHY;  Surgeon: Arida, Muhammad A, MD;  [facility];  Service: Cardiovascular;  Laterality: N/A;   LEFT HEART CATH AND CORONARY ANGIOGRAPHY N/A 10/23/2020   Procedure: LEFT HEART CATH AND CORONARY ANGIOGRAPHY;  Surgeon: Smith, Henry W, MD;  [facility];  Service: Cardiovascular;  Laterality: N/A;   LEFT HEART CATH AND CORONARY ANGIOGRAPHY N/A 03/30/2021   Procedure: LEFT HEART CATH AND CORONARY ANGIOGRAPHY;  Surgeon: End, Christopher, MD;  [facility];  Service: Cardiovascular;  Laterality: N/A;   LEFT HEART CATHETERIZATION WITH CORONARY ANGIOGRAM N/A 07/03/2014   Procedure: LEFT HEART CATHETERIZATION WITH CORONARY ANGIOGRAM;  Surgeon: Henry W Smith, MD;  [facility];  Service: Cardiovascular;  Laterality: N/A;   POLYPECTOMY  01/05/2017   Procedure: POLYPECTOMY;  Surgeon: Rehman, Najeeb U, MD;  [facility];  Service: Endoscopy;;  colon   SHOULDER SURGERY Left     FAMHx:  Family History  Problem Relation Age of Onset   Colon cancer Paternal Grandfather    Cancer Father    Cancer Maternal Uncle    Alzheimer's disease Paternal Aunt    Cirrhosis Maternal Uncle    Cancer Paternal Aunt        lung   Aneurysm Mother        brain   Heart disease Brother    Heart attack Brother    Mental illness Daughter    ADD / ADHD Daughter    Bipolar disorder Daughter    Mental illness Son    ADD / ADHD Son    Bipolar disorder Son    Mental  illness Son    ADD / ADHD Son    Bipolar disorder Son     SOCHx:   reports that she quit smoking about 9 years ago. Her smoking   use included cigarettes and cigars. She started smoking about 38 years ago. She has a 6.40 pack-year smoking history. She has never used smokeless tobacco. She reports that she does not drink alcohol and does not use drugs.  ALLERGIES:  Allergies  Allergen Reactions   Bee Venom Anaphylaxis and Other (See Comments)    "throat swelled and had to the hospital" - Yellow jacket sting   Lipitor [Atorvastatin] Rash and Other (See Comments)    Liver damage & Upper Abdominal pain, Break out like a rash . Per last hosp visit. It exposed on inpatient stay.   Penicillins Anaphylaxis and Other (See Comments)    Has patient had a PCN reaction causing immediate rash, facial/tongue/throat swelling, SOB or lightheadedness with hypotension: Yes Has patient had a PCN reaction causing severe rash involving mucus membranes or skin necrosis: Yes Has patient had a PCN reaction that required hospitalization Yes Has patient had a PCN reaction occurring within the last 10 years: Yes If all of the above answers are "NO", then may proceed with Cephalosporin use. Patient states she carries an epi-pen   Shellfish Allergy Swelling   Tomato Itching and Swelling   Valsartan Itching   Zofran [Ondansetron] Itching   Crestor [Rosuvastatin] Rash and Other (See Comments)    Upper Abdominal pain    ROS: Pertinent items noted in HPI and remainder of comprehensive ROS otherwise negative.  HOME MEDS: Current Outpatient Medications on File Prior to Visit  Medication Sig Dispense Refill   albuterol (VENTOLIN HFA) 108 (90 Base) MCG/ACT inhaler Inhale 2 puffs into the lungs every 6 (six) hours as needed for wheezing or shortness of breath.     ALPRAZolam (XANAX) 0.5 MG tablet Take 1 tablet (0.5 mg total) by mouth 2 (two) times daily as needed for anxiety. 30 tablet 0   amLODipine (NORVASC) 10 MG  tablet TAKE 1 TABLET ONCE DAILY. 90 tablet 3   aspirin EC 81 MG tablet Take 1 tablet (81 mg total) by mouth daily. 180 tablet 1   blood glucose meter kit and supplies KIT Use to check blood sugar daily. 1 each 0   Cholecalciferol (DIALYVITE VITAMIN D 5000) 125 MCG (5000 UT) capsule Take 5,000 Units by mouth daily.     clopidogrel (PLAVIX) 75 MG tablet Take 75 mg by mouth daily.     diphenhydrAMINE (BENADRYL) 25 MG tablet Take 25 mg by mouth every 6 (six) hours as needed for allergies.     escitalopram (LEXAPRO) 20 MG tablet Take 1 tablet (20 mg total) by mouth daily. 30 tablet 3   Evolocumab (REPATHA SURECLICK [place]) Inject 412 mg into the skin every 14 (fourteen) days.     fluorometholone (FML) 0.1 % ophthalmic suspension daily.     fluticasone-salmeterol (ADVAIR) 100-50 MCG/ACT AEPB INHALE 1 PUFF BY MOUTH TWICE DAILY 60 each 10   isosorbide mononitrate (IMDUR) 60 MG 24 hr tablet Take 1 tablet (60 mg total) by mouth 2 (two) times daily. 60 tablet 5   LINZESS 145 MCG CAPS capsule Take 1 capsule (145 mcg total) by mouth daily. 30 capsule 3   metoprolol tartrate (LOPRESSOR) 25 MG tablet Take 1 tablet (25 mg total) by mouth 2 (two) times daily. May take additional half tablet as needed daily for palpitations. 215 tablet 3   nitroGLYCERIN (NITROSTAT) 0.4 MG SL tablet DISSOLVE 1 TABLET UNDER THE TONGUE AS NEEDED FOR CHEST PAIN EVERY 5 MINUTES UP TO 3 TIMES. IF NO RELIEF CALL 911. 25 tablet 1   prednisoLONE  acetate (PRED FORTE) 1 % ophthalmic suspension Place 1 drop into both eyes daily as needed (dry eyes/eye infection symptoms).     RESTASIS 0.05 % ophthalmic emulsion daily.     hydrochlorothiazide (HYDRODIURIL) 25 MG tablet Take 25 mg by mouth every morning.     HYDROcodone-acetaminophen (NORCO) 10-325 MG tablet Take 1 tablet by mouth every 6 (six) hours as needed. (Patient not taking: Reported on 01/20/2022)     levothyroxine (SYNTHROID) 50 MCG tablet Take 50 mcg by mouth daily. (Patient not taking:  Reported on 01/20/2022)     pantoprazole (PROTONIX) 40 MG tablet Take 40 mg by mouth 2 (two) times daily. (Patient not taking: Reported on 01/20/2022)     thyroid (ARMOUR) 60 MG tablet Take 60 mg by mouth daily before breakfast. (Patient not taking: Reported on 01/20/2022)     Current Facility-Administered Medications on File Prior to Visit  Medication Dose Route Frequency Provider Last Rate Last Admin   NONFORMULARY OR COMPOUNDED ITEM 0.2 mL  0.2 mL Intramuscular Q7 days Gosrani, Nimish C, MD   0.2 mL at 10/23/19 1002    LABS/IMAGING: No results found for this or any previous visit (from the past 48 hour(s)). No results found.  LIPID PANEL:    Component Value Date/Time   CHOL 153 01/20/2022 1446   TRIG 85 01/20/2022 1446   HDL 74 01/20/2022 1446   CHOLHDL 2.1 01/20/2022 1446   VLDL 17 01/20/2022 1446   LDLCALC 62 01/20/2022 1446   LDLCALC 146 (H) 07/03/2020 1334    WEIGHTS: Wt Readings from Last 3 Encounters:  01/20/22 154 lb (69.9 kg)  12/31/21 152 lb (68.9 kg)  12/24/21 153 lb (69.4 kg)    VITALS: BP (!) 190/96   Pulse 60   Ht 5' 4" (1.626 m)   Wt 154 lb (69.9 kg)   SpO2 98%   BMI 26.43 kg/m   EXAM: General appearance: alert and no distress Neck: no carotid bruit, no JVD, and thyroid not enlarged, symmetric, no tenderness/mass/nodules Lungs: clear to auscultation bilaterally Heart: regular rate and rhythm, S1, S2 normal, no murmur, click, rub or gallop Abdomen: soft, non-tender; bowel sounds normal; no masses,  no organomegaly Extremities: extremities normal, atraumatic, no cyanosis or edema Pulses: 2+ and symmetric Skin: Skin color, texture, turgor normal. No rashes or lesions Neurologic: Grossly normal Psych: Pleasant  EKG: N/A  ASSESSMENT: Unstable angina Coronary artery disease with prior PCI and occluded right coronary with left-to-right collaterals, 50% diffuse LAD stenosis (10/2020) -repeat cardiac catheterization 03/2021, unchanged, medical therapy  recommended Mixed dyslipidemia, goal LDL less than 70 Statin allergic - hives, now on Repatha Hypertension  PLAN: 1.   Bethany Wang has had recent significant increased need for her nitroglycerin.  She is also had intermittent arrhythmias and was noted to have some NSVT on her monitor.  Today she was tachycardic and hypertensive which was unusual.  She reports being more short of breath and fatigues easily and seems to get dizzy after taking nitroglycerin.  I suspect she is having unstable angina and has had progression of her coronary disease since her last cath in December of last year.  I would recommend a repeat left heart catheterization.  We again discussed the risk, benefits and alternatives of this procedure and she is agreeable to proceed.  Try to schedule later this week. Follow-up with me afterward.  Shared Decision Making/Informed Consent The risks [stroke (1 in 1000), death (1 in 1000), kidney failure [usually temporary] (1 in 500), bleeding (1   in 200), allergic reaction [possibly serious] (1 in 200)], benefits (diagnostic support and management of coronary artery disease) and alternatives of a cardiac catheterization were discussed in detail with Bethany Wang and she is willing to proceed.   Shahir Karen C. Lothar Prehn, MD, FACC, FACP  [place]  [facility]  Medical Director of the [facility] Diplomate of the American Board of Clinical Lipidology Attending Cardiologist  Direct Dial: [phone]  Fax: [phone]  Website:  [email or web address]  Sayward Horvath C Gianmarco Roye 01/20/2022, 8:30 PM    

## 2022-01-20 NOTE — H&P (View-Only) (Signed)
Office Note  Chief Complaint:  No complaints  Primary Care Physician: [place] Nation, MD  Primary Cardiologist:  Pixie Casino, MD  HPI:  Bethany Wang is a 56 y.o. female who is being seen today for the evaluation of dyslipidemia at the request of [place] Nation, MD. This is a pleasant 56 year old female that I do not recall seeing in the hospital but she remembers seeing me with a recent hospitalization.  She was hospitalized in July with chest pain and underwent left heart catheterization.  This demonstrated occlusion of the right coronary artery with the previously placed coronary stent and left to right collaterals.  There was a 50% in-stent restenosis of the proximal LAD and 90% diagonal ostial narrowing.  There is also 60 to 70% obtuse marginal narrowing and 50% distal circumflex disease.  Medical therapy was recommended.  She was referred today for management of dyslipidemia.  Unfortunately she was intolerant of statins including atorvastatin and rosuvastatin, both of which caused significant side effects.  Subsequently she had been switched by her primary care provider to [place] and has already had a dose of the medication.  It is too early to look for response to this medication however I agree with this therapy.  More concerning, however today is symptoms of progressive chest pain.  She reports that ever since this past summer she has had worsening chest pain despite long-acting nitroglycerin.  She has been using short acting nitroglycerin very frequently and gets some minimal relief however it is short-lived.  EKG today shows normal sinus rhythm with some nonspecific T wave changes.  06/08/2021  Bethany Wang returns today for recurrent chest pain.  She has been in the emergency department twice this year including the other week with chest pain.  She said she had an episode when she was at church including diaphoresis, shortness of breath and left arm pain.  She took  nitroglycerin and it took several minutes to up to an hour to improve.  She also notes she gets short of breath and has chest discomfort when exerting herself.  She says she gets chest discomfort working in her garden.  I had sent her for cardiac catheterization in December 2022 for symptoms concerning for unstable angina.  Demonstrated moderate to severe multivessel coronary disease, similar to prior cath in July 2022 however FFR was negative of the mid LAD stent in OM1 lesion and a chronic total occlusion of the proximal RCA stent was again noted.  LVEDP was mildly elevated.  More antianginal therapy was recommended.  Initially she was switched from isosorbide 30 mg 3 times daily to 60 mg twice daily.  She says initially this helped somewhat but since then her symptoms have worsened.  She is now taking sublingual nitro almos  09/04/2021  Bethany Wang is seen today in follow-up.  She reports marked improvement in her symptoms.  Blood pressure is now much better controlled at 124/76.  It turns out she had a rash that was attributable to the Ranexa.  She stopped that.  She had been switched to [place] which she is tolerating very well and has had a significant improvement in her cholesterol.  In February showed total cholesterol 123 (down from 216), triglycerides 72, HDL 52 and LDL 57.  Overall she is very pleased with how she is feeling.  She is still struggling with bilateral hip pain and has been working with Dr. Inda Merlin who is contemplating sequential, bilateral hip replacement.  01/20/2022  Bethany Wang was an acute add-on visit today.  Apparently she was seen this morning while doing rehab by her primary care physician who is next[place]door.  She was noted to be tachycardic and in talking with her PCP he felt that she needed to be in the hospital.  Her blood pressure was quite elevated.  This had upset her.  She contacted [place] and was able to get in to an appointment today.  In speaking with her, she says that  she has had significant increase in the need for nitroglycerin over the past several weeks.  In fact she is taking it about every other day in addition to her long-acting nitrates.  Her last heart catheterization in 2022 did show moderate to severe stenosis of the LAD (in-stent), and there is chronic total occlusion of the proximal RCA stent with left to right collaterals.  FFR was nonsignificant and no intervention was performed.  Increased antianginal therapy was recommended.  Despite this, it seems that her symptoms continue to worsen.  She has had intermittent episodes of tachycardia.  She had monitoring over the summer which showed some episodes of nonsustained VT.  Her beta-blocker was titrated.  PMHx:  Past Medical History:  Diagnosis Date   Anemia    Anxiety    Bilateral breast cysts 12/30/2015   Brain tumor ([place])    Breast cancer ([place])    Left Breast Cancer   Breast disorder    BV (bacterial vaginosis) 09/09/2015   Coronary atherosclerosis of native coronary artery    a. s/p DES to RCA in 2013 b. DES to LAD in 2014 c. cath in 04/2016 showing patent LAD stent with D2 jailed by LAD stent and CTO of RCA with left to right collaterals present   Cyst of pharynx or nasopharynx    [name]   Decreased libido 12/25/2018   Depression    Diabetes mellitus without complication ([place])    Essential hypertension    Falls    x 3-4 in past year   GERD (gastroesophageal reflux disease)    Headache    Heart attack ([place])    x4   History of hematuria    Mixed hyperlipidemia    Personal history of chemotherapy    Left Breast Cancer   Personal history of radiation therapy    Left Breast Cancer   Pre-diabetes    PUD (peptic ulcer disease)    Seizures ([place])    Shingles 09/09/2015   Stroke ([place])    12-2017 on Plavix, only deficit is headaches   Suicide attempt ([facility])    3 attempts in remote past   Trichimoniasis 08/20/2019   Treated 08/20/19    Uterine cancer ([place])     Vitamin D deficiency disease 04/03/2019    Past Surgical History:  Procedure Laterality Date   ABDOMINAL HYSTERECTOMY     BIOPSY  11/28/2019   Procedure: BIOPSY;  Surgeon: Rogene Houston, MD;  [facility];  Service: Endoscopy;;  antral, gastric body    BLADDER SURGERY     BREAST LUMPECTOMY Left    BREAST LUMPECTOMY Right 02/24/2018   Procedure: RIGHT BREAST LUMPECTOMY ERAS PATHWAY;  Surgeon: Jovita Kussmaul, MD;  Location: [facility] OR;  Service: General;  Laterality: Right;   CARDIAC CATHETERIZATION N/A 05/10/2016   Procedure: Left Heart Cath and Coronary Angiography;  Surgeon: Belva Crome, MD;  Location: [place] [facility] LAB;  Service: Cardiovascular;  Laterality: N/A;   CAUTERIZE INNER NOSE  07/13/2020  COLONOSCOPY  May 2010   Fleishman: normal rectum, internal hemorrhoids, , benign colonic polyp   COLONOSCOPY N/A 01/05/2017   Procedure: COLONOSCOPY;  Surgeon: Rogene Houston, MD;  [facility];  Service: Endoscopy;  Laterality: N/A;  1:00   ESOPHAGEAL DILATION  11/28/2019   Procedure: ESOPHAGEAL DILATION;  Surgeon: Rogene Houston, MD;  [facility];  Service: Endoscopy;;   ESOPHAGOGASTRODUODENOSCOPY     2001 Dr. Amedeo Plenty: distal esophagitis, small hiatal hernia,. Dr. Tamala Julian 2006? no records available currently, pt also reports  EGD a few years ago with Dr. Gala Romney, do not have these reports anywhere in medical records   ESOPHAGOGASTRODUODENOSCOPY  06/02/2011   FAO:ZHYQMV pill impaction as described above s/p dilation of a probable cervical esophageal web/bx abnormal esophageal and gastric mucosa. + H.pylori gastritis    ESOPHAGOGASTRODUODENOSCOPY (EGD) WITH PROPOFOL N/A 11/28/2019   Procedure: ESOPHAGOGASTRODUODENOSCOPY (EGD) WITH PROPOFOL;  Surgeon: Rogene Houston, MD;  [facility];  Service: Endoscopy;  Laterality: N/A;  1020   EYE SURGERY     Removed glass   HAND SURGERY Right    INTRAVASCULAR PRESSURE WIRE/FFR STUDY N/A 10/23/2020   Procedure:  INTRAVASCULAR PRESSURE WIRE/FFR STUDY;  Surgeon: Belva Crome, MD;  Location: [place] [facility];  Service: Cardiovascular;  Laterality: N/A;   INTRAVASCULAR PRESSURE WIRE/FFR STUDY N/A 03/30/2021   Procedure: INTRAVASCULAR PRESSURE WIRE/FFR STUDY;  Surgeon: Nelva Bush, MD;  Location: [place];  Service: Cardiovascular;  Laterality: N/A;   Left breast lumpectomy     Benign   LEFT HEART CATH AND CORONARY ANGIOGRAPHY N/A 10/25/2018   Procedure: LEFT HEART CATH AND CORONARY ANGIOGRAPHY;  Surgeon: Wellington Hampshire, MD;  Location: [place] [facility];  Service: Cardiovascular;  Laterality: N/A;   LEFT HEART CATH AND CORONARY ANGIOGRAPHY N/A 10/23/2020   Procedure: LEFT HEART CATH AND CORONARY ANGIOGRAPHY;  Surgeon: Belva Crome, MD;  Location: [place];  Service: Cardiovascular;  Laterality: N/A;   LEFT HEART CATH AND CORONARY ANGIOGRAPHY N/A 03/30/2021   Procedure: LEFT HEART CATH AND CORONARY ANGIOGRAPHY;  Surgeon: Nelva Bush, MD;  Location: [place] [facility] LAB;  Service: Cardiovascular;  Laterality: N/A;   LEFT HEART CATHETERIZATION WITH CORONARY ANGIOGRAM N/A 07/03/2014   Procedure: LEFT HEART CATHETERIZATION WITH CORONARY ANGIOGRAM;  Surgeon: Belva Crome, MD;  Location: [facility] CATH LAB;  Service: Cardiovascular;  Laterality: N/A;   POLYPECTOMY  01/05/2017   Procedure: POLYPECTOMY;  Surgeon: Rogene Houston, MD;  [facility];  Service: Endoscopy;;  colon   SHOULDER SURGERY Left     FAMHx:  Family History  Problem Relation Age of Onset   Colon cancer Paternal Grandfather    Cancer Father    Cancer Maternal Uncle    Alzheimer's disease Paternal Aunt    Cirrhosis Maternal Uncle    Cancer Paternal Aunt        lung   Aneurysm Mother        brain   Heart disease Brother    Heart attack Brother    Mental illness Daughter    ADD / ADHD Daughter    Bipolar disorder Daughter    Mental illness Son    ADD / ADHD Son    Bipolar disorder Son    Mental  illness Son    ADD / ADHD Son    Bipolar disorder Son     SOCHx:   reports that she quit smoking about 9 years ago. Her smoking  use included cigarettes and cigars. She started smoking about 38 years ago. She has a 6.40 pack-year smoking history. She has never used smokeless tobacco. She reports that she does not drink alcohol and does not use drugs.  ALLERGIES:  Allergies  Allergen Reactions   Bee Venom Anaphylaxis and Other (See Comments)    "throat swelled and had to the hospital" - Yellow jacket sting   Lipitor [Atorvastatin] Rash and Other (See Comments)    Liver damage & Upper Abdominal pain, Break out like a rash . Per last hosp visit. It exposed on inpatient stay.   Penicillins Anaphylaxis and Other (See Comments)    Has patient had a PCN reaction causing immediate rash, facial/tongue/throat swelling, SOB or lightheadedness with hypotension: Yes Has patient had a PCN reaction causing severe rash involving mucus membranes or skin necrosis: Yes Has patient had a PCN reaction that required hospitalization Yes Has patient had a PCN reaction occurring within the last 10 years: Yes If all of the above answers are "NO", then may proceed with Cephalosporin use. Patient states she carries an epi-pen   Shellfish Allergy Swelling   Tomato Itching and Swelling   Valsartan Itching   Zofran [Ondansetron] Itching   Crestor [Rosuvastatin] Rash and Other (See Comments)    Upper Abdominal pain    ROS: Pertinent items noted in HPI and remainder of comprehensive ROS otherwise negative.  HOME MEDS: Current Outpatient Medications on File Prior to Visit  Medication Sig Dispense Refill   albuterol (VENTOLIN HFA) 108 (90 Base) MCG/ACT inhaler Inhale 2 puffs into the lungs every 6 (six) hours as needed for wheezing or shortness of breath.     ALPRAZolam (XANAX) 0.5 MG tablet Take 1 tablet (0.5 mg total) by mouth 2 (two) times daily as needed for anxiety. 30 tablet 0   amLODipine (NORVASC) 10 MG  tablet TAKE 1 TABLET ONCE DAILY. 90 tablet 3   aspirin EC 81 MG tablet Take 1 tablet (81 mg total) by mouth daily. 180 tablet 1   blood glucose meter kit and supplies KIT Use to check blood sugar daily. 1 each 0   Cholecalciferol (DIALYVITE VITAMIN D 5000) 125 MCG (5000 UT) capsule Take 5,000 Units by mouth daily.     clopidogrel (PLAVIX) 75 MG tablet Take 75 mg by mouth daily.     diphenhydrAMINE (BENADRYL) 25 MG tablet Take 25 mg by mouth every 6 (six) hours as needed for allergies.     escitalopram (LEXAPRO) 20 MG tablet Take 1 tablet (20 mg total) by mouth daily. 30 tablet 3   Evolocumab (REPATHA SURECLICK [place]) Inject 412 mg into the skin every 14 (fourteen) days.     fluorometholone (FML) 0.1 % ophthalmic suspension daily.     fluticasone-salmeterol (ADVAIR) 100-50 MCG/ACT AEPB INHALE 1 PUFF BY MOUTH TWICE DAILY 60 each 10   isosorbide mononitrate (IMDUR) 60 MG 24 hr tablet Take 1 tablet (60 mg total) by mouth 2 (two) times daily. 60 tablet 5   LINZESS 145 MCG CAPS capsule Take 1 capsule (145 mcg total) by mouth daily. 30 capsule 3   metoprolol tartrate (LOPRESSOR) 25 MG tablet Take 1 tablet (25 mg total) by mouth 2 (two) times daily. May take additional half tablet as needed daily for palpitations. 215 tablet 3   nitroGLYCERIN (NITROSTAT) 0.4 MG SL tablet DISSOLVE 1 TABLET UNDER THE TONGUE AS NEEDED FOR CHEST PAIN EVERY 5 MINUTES UP TO 3 TIMES. IF NO RELIEF CALL 911. 25 tablet 1   prednisoLONE  acetate (PRED FORTE) 1 % ophthalmic suspension Place 1 drop into both eyes daily as needed (dry eyes/eye infection symptoms).     RESTASIS 0.05 % ophthalmic emulsion daily.     hydrochlorothiazide (HYDRODIURIL) 25 MG tablet Take 25 mg by mouth every morning.     HYDROcodone-acetaminophen (NORCO) 10-325 MG tablet Take 1 tablet by mouth every 6 (six) hours as needed. (Patient not taking: Reported on 01/20/2022)     levothyroxine (SYNTHROID) 50 MCG tablet Take 50 mcg by mouth daily. (Patient not taking:  Reported on 01/20/2022)     pantoprazole (PROTONIX) 40 MG tablet Take 40 mg by mouth 2 (two) times daily. (Patient not taking: Reported on 01/20/2022)     thyroid (ARMOUR) 60 MG tablet Take 60 mg by mouth daily before breakfast. (Patient not taking: Reported on 01/20/2022)     Current Facility-Administered Medications on File Prior to Visit  Medication Dose Route Frequency Provider Last Rate Last Admin   NONFORMULARY OR COMPOUNDED ITEM 0.2 mL  0.2 mL Intramuscular Q7 days [name], Nimish C, MD   0.2 mL at 10/23/19 1002    LABS/IMAGING: No results found for this or any previous visit (from the past 48 hour(s)). No results found.  LIPID PANEL:    Component Value Date/Time   CHOL 153 01/20/2022 1446   TRIG 85 01/20/2022 1446   HDL 74 01/20/2022 1446   CHOLHDL 2.1 01/20/2022 1446   VLDL 17 01/20/2022 1446   LDLCALC 62 01/20/2022 1446   LDLCALC 146 (H) 07/03/2020 1334    WEIGHTS: Wt Readings from Last 3 Encounters:  01/20/22 154 lb (69.9 kg)  12/31/21 152 lb (68.9 kg)  12/24/21 153 lb (69.4 kg)    VITALS: BP (!) 190/96   Pulse 60   Ht 5' 4" (1.626 m)   Wt 154 lb (69.9 kg)   SpO2 98%   BMI 26.43 kg/m   EXAM: General appearance: alert and no distress Neck: no carotid bruit, no JVD, and thyroid not enlarged, symmetric, no tenderness/mass/nodules Lungs: clear to auscultation bilaterally Heart: regular rate and rhythm, S1, S2 normal, no murmur, click, rub or gallop Abdomen: soft, non-tender; bowel sounds normal; no masses,  no organomegaly Extremities: extremities normal, atraumatic, no cyanosis or edema Pulses: 2+ and symmetric Skin: Skin color, texture, turgor normal. No rashes or lesions Neurologic: Grossly normal Psych: Pleasant  EKG: N/A  ASSESSMENT: Unstable angina Coronary artery disease with prior PCI and occluded right coronary with left-to-right collaterals, 50% diffuse LAD stenosis (10/2020) -repeat cardiac catheterization 03/2021, unchanged, medical therapy  recommended Mixed dyslipidemia, goal LDL less than 70 Statin allergic - hives, now on Repatha Hypertension  PLAN: 1.   Ms. Wang has had recent significant increased need for her nitroglycerin.  She is also had intermittent arrhythmias and was noted to have some NSVT on her monitor.  Today she was tachycardic and hypertensive which was unusual.  She reports being more short of breath and fatigues easily and seems to get dizzy after taking nitroglycerin.  I suspect she is having unstable angina and has had progression of her coronary disease since her last cath in December of last year.  I would recommend a repeat left heart catheterization.  We again discussed the risk, benefits and alternatives of this procedure and she is agreeable to proceed.  Try to schedule later this week. Follow-up with me afterward.  Shared Decision Making/Informed Consent The risks [stroke (1 in 1000), death (1 in 1000), kidney failure [usually temporary] (1 in 500), bleeding (1  in 200), allergic reaction [possibly serious] (1 in 200)], benefits (diagnostic support and management of coronary artery disease) and alternatives of a cardiac catheterization were discussed in detail with Bethany Wang and she is willing to proceed.   Pixie Casino, MD, [facility], [place] Director of the [facility] Lipid Disorders &   Cardiovascular Risk Reduction Clinic Diplomate of the American Board of Clinical Lipidology Attending Cardiologist  Direct Dial: [phone]  Fax: [phone]  Website:  [email or web address] 01/20/2022, 8:30 PM

## 2022-01-20 NOTE — Patient Instructions (Addendum)
Medication Instructions:  Your physician recommends that you continue on your current medications as directed. Please refer to the Current Medication list given to you today.   Labwork: TODAY: -BMET -CBC  Testing/Procedures: None  Follow-Up: Follow up with Bethany Raring, NP after Cardiac Cath.   Any Other Special Instructions Will Be Listed Below (If Applicable).     If you need a refill on your cardiac medications before your next appointment, please call your pharmacy.   [place] A DEPT OF [place] A DEPT OF [place]. [facility] [place] [phone] [place] [id number] Dept: [phone] Loc: [place]  01/20/2022  You are scheduled for a Cardiac Catheterization on Friday, October 13 with Dr. Sherren Mocha.  1. Please arrive at the [facility] (Main Entrance A) at [facility]: [address] [place], [place] [address] at 8:30 AM (This time is two hours before your procedure to ensure your preparation). Free valet parking service is available.   Special note: Every effort is made to have your procedure done on time. Please understand that emergencies sometimes delay scheduled procedures.  2. Diet: Do not eat solid foods after midnight.  The patient may have clear liquids until 5am upon the day of the procedure.  3. Labs: You will need to have blood drawn on , October 11 at [place]. [address], [place]   4. Medication instructions in preparation for your procedure:   Contrast Allergy: No  On the morning of your procedure, take your Aspirin 81 mg and any morning medicines NOT listed above.  You may use sips of water.  5. Plan for one night stay--bring personal belongings. 6. Bring a current list of your medications and current insurance cards. 7. You MUST have a responsible person to drive you home. 8. Someone MUST be with you the  first 24 hours after you arrive home or your discharge will be delayed. 9. Please wear clothes that are easy to get on and off and wear slip-on shoes.  Thank you for allowing [place] to care for you!   -- [facility]

## 2022-01-21 ENCOUNTER — Telehealth: Payer: Self-pay | Admitting: *Deleted

## 2022-01-21 MED ORDER — POTASSIUM CHLORIDE CRYS ER 20 MEQ PO TBCR: EXTENDED_RELEASE_TABLET | ORAL | 0 refills | Status: DC

## 2022-01-21 NOTE — Telephone Encounter (Addendum)
Cardiac Catheterization scheduled at [facility] for: Friday January 22, 2022 10:30 AM Arrival time and place: [place] Entrance A at: 8:00 AM-needs BMP  Nothing to eat after midnight prior to procedure, clear liquids until 5 AM day of procedure.  Medication instructions: -Hold:  HCTZ-AM of procedure  -Except hold medications usual morning medications can be taken with sips of water including aspirin 81 mg and Plavix 75 mg.  Confirmed patient has responsible adult to drive home post procedure and be with patient first 24 hours after arriving home.  Patient reports no new symptoms concerning for COVID-19 in the past 10 days.   01/22/22 K 3.2 Per Dr Debara Pickett- KCl 20 mEq x 2 today (total 40 mEq).  Reviewed procedure instructions and Dr Lysbeth Penner recommendation to take KCl 20 mEq x 2 today with patient, prescription to [facility].

## 2022-01-22 ENCOUNTER — Ambulatory Visit (HOSPITAL_COMMUNITY)
Admission: RE | Admit: 2022-01-22 | Discharge: 2022-01-22 | Disposition: A | Discharge status: Home / Self Care | Payer: Medicare Other | Attending: Cardiovascular Disease | Admitting: Cardiovascular Disease

## 2022-01-22 ENCOUNTER — Other Ambulatory Visit: Payer: Self-pay

## 2022-01-22 ENCOUNTER — Encounter (HOSPITAL_COMMUNITY)
Admission: RE | Disposition: A | Discharge status: Home / Self Care | Payer: Self-pay | Source: Home / Self Care | Attending: Cardiovascular Disease

## 2022-01-22 DIAGNOSIS — Z955 Presence of coronary angioplasty implant and graft: Secondary | ICD-10-CM | POA: Insufficient documentation

## 2022-01-22 DIAGNOSIS — T82855A Stenosis of coronary artery stent, initial encounter: Secondary | ICD-10-CM | POA: Diagnosis not present

## 2022-01-22 DIAGNOSIS — Z01812 Encounter for preprocedural laboratory examination: Secondary | ICD-10-CM

## 2022-01-22 DIAGNOSIS — I25119 Atherosclerotic heart disease of native coronary artery with unspecified angina pectoris: Secondary | ICD-10-CM | POA: Diagnosis not present

## 2022-01-22 DIAGNOSIS — I10 Essential (primary) hypertension: Secondary | ICD-10-CM | POA: Insufficient documentation

## 2022-01-22 DIAGNOSIS — I25110 Atherosclerotic heart disease of native coronary artery with unstable angina pectoris: Secondary | ICD-10-CM | POA: Diagnosis not present

## 2022-01-22 DIAGNOSIS — I2582 Chronic total occlusion of coronary artery: Secondary | ICD-10-CM | POA: Insufficient documentation

## 2022-01-22 DIAGNOSIS — E782 Mixed hyperlipidemia: Secondary | ICD-10-CM | POA: Diagnosis not present

## 2022-01-22 DIAGNOSIS — Y832 Surgical operation with anastomosis, bypass or graft as the cause of abnormal reaction of the patient, or of later complication, without mention of misadventure at the time of the procedure: Secondary | ICD-10-CM | POA: Diagnosis not present

## 2022-01-22 HISTORY — PX: LEFT HEART CATH AND CORONARY ANGIOGRAPHY: CATH118249

## 2022-01-22 LAB — BASIC METABOLIC PANEL
Anion gap: 5 (ref 5–15)
BUN: 6 mg/dL (ref 6–20)
CO2: 24 mmol/L (ref 22–32)
Calcium: 8.8 mg/dL — ABNORMAL LOW (ref 8.9–10.3)
Chloride: 111 mmol/L (ref 98–111)
Creatinine, Ser: 0.78 mg/dL (ref 0.44–1.00)
GFR, Estimated: >60 mL/min (ref >60)
Glucose, Bld: 93 mg/dL (ref 70–99)
Potassium: 4.3 mmol/L (ref 3.5–5.1)
Sodium: 140 mmol/L (ref 135–145)

## 2022-01-22 LAB — GLUCOSE, CAPILLARY: Glucose-Capillary: 101 mg/dL — ABNORMAL HIGH (ref 70–99)

## 2022-01-22 SURGERY — LEFT HEART CATH AND CORONARY ANGIOGRAPHY
Anesthesia: LOCAL

## 2022-01-22 MED ORDER — LABETALOL HCL 5 MG/ML IV SOLN: 10.0000 mg | INTRAVENOUS | Status: DC | PRN

## 2022-01-22 MED ORDER — HEPARIN (PORCINE) IN NACL 1000-0.9 UT/500ML-% IV SOLN
INTRAVENOUS | Status: DC | PRN
  Administered 2022-01-22 (×2): 500 mL

## 2022-01-22 MED ORDER — HEPARIN SODIUM (PORCINE) 1000 UNIT/ML IJ SOLN
INTRAMUSCULAR | Status: AC
  Filled 2022-01-22: qty 10

## 2022-01-22 MED ORDER — FENTANYL CITRATE (PF) 100 MCG/2ML IJ SOLN
INTRAMUSCULAR | Status: DC | PRN
  Administered 2022-01-22 (×2): 25 ug via INTRAVENOUS

## 2022-01-22 MED ORDER — SODIUM CHLORIDE 0.9% FLUSH: 3.0000 mL | Freq: Two times a day (BID) | INTRAVENOUS | Status: DC

## 2022-01-22 MED ORDER — SODIUM CHLORIDE 0.9 % IV SOLN: INTRAVENOUS | Status: DC

## 2022-01-22 MED ORDER — ASPIRIN 81 MG PO CHEW: 81.0000 mg | CHEWABLE_TABLET | ORAL | Status: DC

## 2022-01-22 MED ORDER — CLOPIDOGREL BISULFATE 75 MG PO TABS: 75.0000 mg | ORAL_TABLET | ORAL | Status: DC

## 2022-01-22 MED ORDER — HYDRALAZINE HCL 20 MG/ML IJ SOLN
INTRAMUSCULAR | Status: AC
  Filled 2022-01-22: qty 1

## 2022-01-22 MED ORDER — VERAPAMIL HCL 2.5 MG/ML IV SOLN
INTRAVENOUS | Status: AC
  Filled 2022-01-22: qty 2

## 2022-01-22 MED ORDER — LIDOCAINE HCL (PF) 1 % IJ SOLN
INTRAMUSCULAR | Status: DC | PRN
  Administered 2022-01-22: 10 mL via SUBCUTANEOUS

## 2022-01-22 MED ORDER — SODIUM CHLORIDE 0.9 % WEIGHT BASED INFUSION
3.0000 mL/kg/h | INTRAVENOUS | Status: AC
  Administered 2022-01-22: 3 mL/kg/h via INTRAVENOUS

## 2022-01-22 MED ORDER — IOHEXOL 350 MG/ML SOLN
INTRAVENOUS | Status: DC | PRN
  Administered 2022-01-22: 40 mL via INTRACARDIAC

## 2022-01-22 MED ORDER — SODIUM CHLORIDE 0.9 % WEIGHT BASED INFUSION: 1.0000 mL/kg/h | INTRAVENOUS | Status: DC

## 2022-01-22 MED ORDER — MIDAZOLAM HCL 2 MG/2ML IJ SOLN
INTRAMUSCULAR | Status: DC | PRN
  Administered 2022-01-22 (×2): 2 mg via INTRAVENOUS

## 2022-01-22 MED ORDER — HYDRALAZINE HCL 20 MG/ML IJ SOLN
INTRAMUSCULAR | Status: DC | PRN
  Administered 2022-01-22: 10 mg via INTRAVENOUS

## 2022-01-22 MED ORDER — SODIUM CHLORIDE 0.9 % IV SOLN: 250.0000 mL | INTRAVENOUS | Status: DC | PRN

## 2022-01-22 MED ORDER — ACETAMINOPHEN 325 MG PO TABS: 650.0000 mg | ORAL_TABLET | ORAL | Status: DC | PRN

## 2022-01-22 MED ORDER — MIDAZOLAM HCL 2 MG/2ML IJ SOLN
INTRAMUSCULAR | Status: AC
  Filled 2022-01-22: qty 2

## 2022-01-22 MED ORDER — HEPARIN (PORCINE) IN NACL 1000-0.9 UT/500ML-% IV SOLN
INTRAVENOUS | Status: AC
  Filled 2022-01-22: qty 1000

## 2022-01-22 MED ORDER — LIDOCAINE HCL (PF) 1 % IJ SOLN
INTRAMUSCULAR | Status: AC
  Filled 2022-01-22: qty 30

## 2022-01-22 MED ORDER — SODIUM CHLORIDE 0.9% FLUSH: 3.0000 mL | INTRAVENOUS | Status: DC | PRN

## 2022-01-22 MED ORDER — HYDRALAZINE HCL 20 MG/ML IJ SOLN: 10.0000 mg | INTRAMUSCULAR | Status: DC | PRN

## 2022-01-22 MED ORDER — FENTANYL CITRATE (PF) 100 MCG/2ML IJ SOLN
INTRAMUSCULAR | Status: AC
  Filled 2022-01-22: qty 2

## 2022-01-22 SURGICAL SUPPLY — 13 items
CATH INFINITI 5FR MULTPACK ANG (CATHETERS) IMPLANT
CLOSURE MYNX CONTROL 5F (Vascular Products) IMPLANT
GLIDESHEATH SLEND SS 6F .021 (SHEATH) IMPLANT
GUIDEWIRE INQWIRE 1.5J.035X260 (WIRE) IMPLANT
INQWIRE 1.5J .035X260CM (WIRE) ×2
KIT HEART LEFT (KITS) ×1 IMPLANT
KIT MICROPUNCTURE NIT STIFF (SHEATH) IMPLANT
PACK CARDIAC CATHETERIZATION (CUSTOM PROCEDURE TRAY) ×1 IMPLANT
SHEATH PINNACLE 5F 10CM (SHEATH) IMPLANT
SHEATH PROBE COVER 6X72 (BAG) IMPLANT
TRANSDUCER W/STOPCOCK (MISCELLANEOUS) ×1 IMPLANT
TUBING CIL FLEX 10 FLL-RA (TUBING) ×1 IMPLANT
WIRE EMERALD 3MM-J .035X150CM (WIRE) IMPLANT

## 2022-01-22 NOTE — Interval H&P Note (Signed)
History and Physical Interval Note:  01/22/2022 11:16 AM  Bethany Wang  has presented today for surgery, with the diagnosis of cad.  The various methods of treatment have been discussed with the patient and family. After consideration of risks, benefits and other options for treatment, the patient has consented to  Procedure(s): LEFT HEART CATH AND CORONARY ANGIOGRAPHY (N/A) as a surgical intervention.  The patient's history has been reviewed, patient examined, no change in status, stable for surgery.  I have reviewed the patient's chart and labs.  Questions were answered to the patient's satisfaction.     Sherren Mocha

## 2022-01-25 ENCOUNTER — Telehealth: Payer: Self-pay | Admitting: Internal Medicine

## 2022-01-25 ENCOUNTER — Encounter (HOSPITAL_COMMUNITY): Payer: Self-pay | Admitting: Cardiovascular Disease

## 2022-01-29 ENCOUNTER — Ambulatory Visit (INDEPENDENT_AMBULATORY_CARE_PROVIDER_SITE_OTHER): Payer: Medicare Other | Admitting: Family

## 2022-01-29 ENCOUNTER — Encounter (HOSPITAL_BASED_OUTPATIENT_CLINIC_OR_DEPARTMENT_OTHER): Payer: Self-pay | Admitting: Family

## 2022-01-29 VITALS — BP 148/82 | Ht 64.0 in | Wt 153.0 lb

## 2022-01-29 DIAGNOSIS — R0683 Snoring: Secondary | ICD-10-CM | POA: Diagnosis not present

## 2022-01-29 DIAGNOSIS — I25118 Atherosclerotic heart disease of native coronary artery with other forms of angina pectoris: Secondary | ICD-10-CM

## 2022-01-29 DIAGNOSIS — E785 Hyperlipidemia, unspecified: Secondary | ICD-10-CM

## 2022-01-29 DIAGNOSIS — F439 Reaction to severe stress, unspecified: Secondary | ICD-10-CM

## 2022-01-29 DIAGNOSIS — I10 Essential (primary) hypertension: Secondary | ICD-10-CM

## 2022-01-29 DIAGNOSIS — G4730 Sleep apnea, unspecified: Secondary | ICD-10-CM

## 2022-01-29 DIAGNOSIS — I471 Supraventricular tachycardia, unspecified: Secondary | ICD-10-CM | POA: Diagnosis not present

## 2022-01-29 MED ORDER — CARVEDILOL 12.5 MG PO TABS: 12.5000 mg | ORAL_TABLET | Freq: Two times a day (BID) | ORAL | 2 refills | Status: DC

## 2022-01-29 NOTE — Patient Instructions (Addendum)
Medication Instructions:  STOP METOPROLOL   START CARVEDILOL 12.5 MG TWICE A DAY    Labwork: FASTING LABS, FIRST THING IN THE MORNING 1-2 WEEKS HOLD YOUR HYDROCHLOROTHIAZIDE 2 DAYS PRIOR TO LABS    Testing/Procedures: Your physician has recommended that you have a sleep study. This test records several body functions during sleep, including: brain activity, eye movement, oxygen and carbon dioxide blood levels, heart rate and rhythm, breathing rate and rhythm, the flow of air through your mouth and nose, snoring, body muscle movements, and chest and belly movement.  Your physician has requested that you have a renal artery duplex. During this test, an ultrasound is used to evaluate blood flow to the kidneys. Allow one hour for this exam. Do not eat after midnight the day before and avoid carbonated beverages. Take your medications as you usually do.   Follow-Up: 03/24/2022 10:00 AM WITH PHARM D   05/31/2022 10:15 AM WITH DR [place]    Special Instructions:  MONITOR BLOOD PRESSURE TWICE A DAY WITH MONITOR GIVEN. MAKE SURE YOU ARE LOGGED INTO APP WHEN YOU ARE CHECKING   DASH Eating Plan DASH stands for "Dietary Approaches to Stop Hypertension." The DASH eating plan is a healthy eating plan that has been shown to reduce high blood pressure (hypertension). It may also reduce your risk for type 2 diabetes, heart disease, and stroke. The DASH eating plan may also help with weight loss. What are tips for following this plan?  General guidelines Avoid eating more than 2,300 mg (milligrams) of salt (sodium) a day. If you have hypertension, you may need to reduce your sodium intake to 1,500 mg a day. Limit alcohol intake to no more than 1 drink a day for nonpregnant women and 2 drinks a day for men. One drink equals 12 oz of beer, 5 oz of wine, or 1 oz of hard liquor. Work with your health care provider to maintain a healthy body weight or to lose weight. Ask what an ideal weight is for you. Get  at least 30 minutes of exercise that causes your heart to beat faster (aerobic exercise) most days of the week. Activities may include walking, swimming, or biking. Work with your health care provider or diet and nutrition specialist (dietitian) to adjust your eating plan to your individual calorie needs. Reading food labels  Check food labels for the amount of sodium per serving. Choose foods with less than 5 percent of the Daily Value of sodium. Generally, foods with less than 300 mg of sodium per serving fit into this eating plan. To find whole grains, look for the word "whole" as the first word in the ingredient list. Shopping Buy products labeled as "low-sodium" or "no salt added." Buy fresh foods. Avoid canned foods and premade or frozen meals. Cooking Avoid adding salt when cooking. Use salt-free seasonings or herbs instead of table salt or sea salt. Check with your health care provider or pharmacist before using salt substitutes. Do not fry foods. Cook foods using healthy methods such as baking, boiling, grilling, and broiling instead. Cook with heart-healthy oils, such as olive, canola, soybean, or sunflower oil. Meal planning Eat a balanced diet that includes: 5 or more servings of fruits and vegetables each day. At each meal, try to fill half of your plate with fruits and vegetables. Up to 6-8 servings of whole grains each day. Less than 6 oz of lean meat, poultry, or fish each day. A 3-oz serving of meat is about the same size as  a deck of cards. One egg equals 1 oz. 2 servings of low-fat dairy each day. A serving of nuts, seeds, or beans 5 times each week. Heart-healthy fats. Healthy fats called Omega-3 fatty acids are found in foods such as flaxseeds and coldwater fish, like sardines, salmon, and mackerel. Limit how much you eat of the following: Canned or prepackaged foods. Food that is high in trans fat, such as fried foods. Food that is high in saturated fat, such as fatty  meat. Sweets, desserts, sugary drinks, and other foods with added sugar. Full-fat dairy products. Do not salt foods before eating. Try to eat at least 2 vegetarian meals each week. Eat more home-cooked food and less restaurant, buffet, and fast food. When eating at a restaurant, ask that your food be prepared with less salt or no salt, if possible. What foods are recommended? The items listed may not be a complete list. Talk with your dietitian about what dietary choices are best for you. Grains Whole-grain or whole-wheat bread. Whole-grain or whole-wheat pasta. Brown rice. [place]. Bulgur. Whole-grain and low-sodium cereals. Pita bread. Low-fat, low-sodium crackers. Whole-wheat flour tortillas. Vegetables Fresh or frozen vegetables (raw, steamed, roasted, or grilled). Low-sodium or reduced-sodium tomato and vegetable juice. Low-sodium or reduced-sodium tomato sauce and tomato paste. Low-sodium or reduced-sodium canned vegetables. Fruits All fresh, dried, or frozen fruit. Canned fruit in natural juice (without added sugar). Meat and other protein foods Skinless chicken or [place]. Ground chicken or [place]. Pork with fat trimmed off. Fish and seafood. Egg whites. Dried beans, peas, or lentils. Unsalted nuts, nut butters, and seeds. Unsalted canned beans. Lean cuts of beef with fat trimmed off. Low-sodium, lean deli meat. Dairy Low-fat (1%) or fat-free (skim) milk. Fat-free, low-fat, or reduced-fat cheeses. Nonfat, low-sodium ricotta or cottage cheese. Low-fat or nonfat yogurt. Low-fat, low-sodium cheese. Fats and oils Soft margarine without trans fats. Vegetable oil. Low-fat, reduced-fat, or light mayonnaise and salad dressings (reduced-sodium). Canola, safflower, olive, soybean, and sunflower oils. Avocado. Seasoning and other foods Herbs. Spices. Seasoning mixes without salt. Unsalted popcorn and pretzels. Fat-free sweets. What foods are not recommended? The items listed may not be a  complete list. Talk with your dietitian about what dietary choices are best for you. Grains Baked goods made with fat, such as croissants, muffins, or some breads. Dry pasta or rice meal packs. Vegetables Creamed or fried vegetables. Vegetables in a cheese sauce. Regular canned vegetables (not low-sodium or reduced-sodium). Regular canned tomato sauce and paste (not low-sodium or reduced-sodium). Regular tomato and vegetable juice (not low-sodium or reduced-sodium). Angie Fava. Olives. Fruits Canned fruit in a light or heavy syrup. Fried fruit. Fruit in cream or butter sauce. Meat and other protein foods Fatty cuts of meat. Ribs. Fried meat. Berniece Salines. Sausage. Bologna and other processed lunch meats. Salami. Fatback. Hotdogs. Bratwurst. Salted nuts and seeds. Canned beans with added salt. Canned or smoked fish. Whole eggs or egg yolks. Chicken or [place] with skin. Dairy Whole or 2% milk, cream, and half-and-half. Whole or full-fat cream cheese. Whole-fat or sweetened yogurt. Full-fat cheese. Nondairy creamers. Whipped toppings. Processed cheese and cheese spreads. Fats and oils Butter. Stick margarine. Lard. Shortening. Ghee. Bacon fat. Tropical oils, such as coconut, palm kernel, or palm oil. Seasoning and other foods Salted popcorn and pretzels. Onion salt, garlic salt, seasoned salt, table salt, and sea salt. Worcestershire sauce. Tartar sauce. Barbecue sauce. Teriyaki sauce. Soy sauce, including reduced-sodium. Steak sauce. Canned and packaged gravies. Fish sauce. Oyster sauce. Cocktail sauce. Horseradish that you  find on the shelf. Ketchup. Mustard. Meat flavorings and tenderizers. Bouillon cubes. Hot sauce and Tabasco sauce. Premade or packaged marinades. Premade or packaged taco seasonings. Relishes. Regular salad dressings. Where to find more information: National Heart, Lung, and [place]: https://wilson-eaton.com/ American Heart Association: www.heart.org Summary The DASH eating plan is a  healthy eating plan that has been shown to reduce high blood pressure (hypertension). It may also reduce your risk for type 2 diabetes, heart disease, and stroke. With the DASH eating plan, you should limit salt (sodium) intake to 2,300 mg a day. If you have hypertension, you may need to reduce your sodium intake to 1,500 mg a day. When on the DASH eating plan, aim to eat more fresh fruits and vegetables, whole grains, lean proteins, low-fat dairy, and heart-healthy fats. Work with your health care provider or diet and nutrition specialist (dietitian) to adjust your eating plan to your individual calorie needs. This information is not intended to replace advice given to you by your health care provider. Make sure you discuss any questions you have with your health care provider. Document Released: 03/18/2011 Document Revised: 03/11/2017 Document Reviewed: 03/22/2016 Elsevier Patient Education  2020 [employer].

## 2022-01-29 NOTE — Progress Notes (Signed)
[facility] Initial Assessment:    Date:  01/29/2022   ID:  Bethany Wang, DOB 1965/09/03, MRN [id number]  PCP:  Donetta Potts, MD  Cardiologist:  Chrystie Nose, MD  Nephrologist:  Referring MD: Donetta Potts, MD   CC: Hypertension  History of Present Illness:    Bethany Wang is a 56 y.o. female with a hx of CAD (s/p DES to RCA 2013, DES to LAD 2014, cath 04/2016 and 10/2020 and 01/2022 patent LAD stent with jailed D2 by LAD stent and CTO RCA to L-R collaterals), HTN, DM2 last seen for cardiac catheterization 01/22/22.  Hospitalized July 2020 with chest pain with LHC with occlusion of RCA with previously placed coronary stent and left-to-right collaterals.  50% ISR of proximal LAD and 90% diagonal ostial narrowing.  Also 60 to 70% obtuse marginal narrowing of 50% distal circumflex disease.  Recommended for medical therapy.  Intolerant to Atorvastatin, Rosuvastatin and Repatha initiated. Repeat catheterization 10/2020, 03/2021 stable compared top previous with recommendation for medical management. Imdur adjusted to 60mg  BID. Trialed on Ranolazine but developed rash and discontinued.  Seen 10/15/21 thyroid medications, Amlodipine, HCTZ on hold by PCP but unclear why.  Her BP in clinic was 203/103 ?  190/90 but she declined medication changes as she anticipated it was high due to argument prior to office visit.  HCTZ and Amlodipine subsequently resumed. Monitor due to palpitations with predominantly normal sinus rhythm.  For short beat run of NSVT.  Several short PSVT noted.  Infrequent PVC/PAC.  No atrial fibrillation.  Carotid duplex 10/2021 showed no significant stenosis.  Saw Dr. 11/2021 01/20/22 for worsening angina with [facility] 01/22/22 revealing stable multivessel coronary disease with CTO of proximal RCA collateralized from LCA, mild to moderate ISR of the LAD with previous negative flow wire, moderately severe OM stenosis and small OM branch with negative flow  wire in the past.  Recommended for continued medical therapy.  Also suggested escalation of antihypertensive therapy as suspected supply/demand mismatch driving symptoms.  Would recommend by Dr. 01/24/22 for consideration of the renal denervation study.  Presents today for follow up with her daughter, Bethany Wang. She enjoys spending time with her grandchildren. Miss Shanda Bumps was diagnosed with hypertension >10 years ago. Does not check BP at home as she does not have a cuff. Continues to work with outpatient PT twice per week. Following a mostly vegetarian diet. Stopped smoking 03/2012. No chest pain, dyspnea since discharge. Right groin bruising resolved but still tender on palpation. Reassurance provided that the "lump" she feels is likely Mynx closure device. Notes cramps in her legs at night but no claudication. Does snore and feels tired most of the day. Prior sleep study >10 years ago. Notes many stressors related to her husband.   Previous antihypertensives: Valsartan - itching Metoprolol - transition to Carvedilol  Past Medical History:  Diagnosis Date   Anemia    Anxiety    Bilateral breast cysts 12/30/2015   Brain tumor (HCC)    Breast cancer (HCC)    Left Breast Cancer   Breast disorder    BV (bacterial vaginosis) 09/09/2015   Coronary atherosclerosis of native coronary artery    a. s/p DES to RCA in 2013 b. DES to LAD in 2014 c. cath in 04/2016 showing patent LAD stent with D2 jailed by LAD stent and CTO of RCA with left to right collaterals present   Cyst of pharynx or nasopharynx    [name]  Decreased libido 12/25/2018   Depression    Diabetes mellitus without complication ([place])    Essential hypertension    Falls    x 3-4 in past year   GERD (gastroesophageal reflux disease)    Headache    Heart attack ([place])    x4   History of hematuria    Mixed hyperlipidemia    Personal history of chemotherapy    Left Breast Cancer   Personal history of radiation  therapy    Left Breast Cancer   Pre-diabetes    PUD (peptic ulcer disease)    Seizures ([place])    Shingles 09/09/2015   Stroke ([place])    12-2017 on Plavix, only deficit is headaches   Suicide attempt ([place])    3 attempts in remote past   Trichimoniasis 08/20/2019   Treated 08/20/19    Uterine cancer ([place])    Vitamin D deficiency disease 04/03/2019    Past Surgical History:  Procedure Laterality Date   ABDOMINAL HYSTERECTOMY     BIOPSY  11/28/2019   Procedure: BIOPSY;  Surgeon: Rogene Houston, MD;  [facility];  Service: Endoscopy;;  antral, gastric body    BLADDER SURGERY     BREAST LUMPECTOMY Left    BREAST LUMPECTOMY Right 02/24/2018   Procedure: RIGHT BREAST LUMPECTOMY ERAS PATHWAY;  Surgeon: Jovita Kussmaul, MD;  Location: [place];  Service: General;  Laterality: Right;   CARDIAC CATHETERIZATION N/A 05/10/2016   Procedure: Left Heart Cath and Coronary Angiography;  Surgeon: Belva Crome, MD;  Location: [place] [facility];  Service: Cardiovascular;  Laterality: N/A;   CAUTERIZE INNER NOSE  07/13/2020   COLONOSCOPY  May 2010   Fleishman: normal rectum, internal hemorrhoids, , benign colonic polyp   COLONOSCOPY N/A 01/05/2017   Procedure: COLONOSCOPY;  Surgeon: Rogene Houston, MD;  [facility];  Service: Endoscopy;  Laterality: N/A;  1:00   ESOPHAGEAL DILATION  11/28/2019   Procedure: ESOPHAGEAL DILATION;  Surgeon: Rogene Houston, MD;  [facility];  Service: Endoscopy;;   ESOPHAGOGASTRODUODENOSCOPY     2001 Dr. Amedeo Plenty: distal esophagitis, small hiatal hernia,. Dr. Tamala Julian 2006? no records available currently, pt also reports  EGD a few years ago with Dr. Gala Romney, do not have these reports anywhere in medical records   ESOPHAGOGASTRODUODENOSCOPY  06/02/2011   SWF:UXNATF pill impaction as described above s/p dilation of a probable cervical esophageal web/bx abnormal esophageal and gastric mucosa. + H.pylori gastritis    ESOPHAGOGASTRODUODENOSCOPY (EGD)  WITH PROPOFOL N/A 11/28/2019   Procedure: ESOPHAGOGASTRODUODENOSCOPY (EGD) WITH PROPOFOL;  Surgeon: Rogene Houston, MD;  [facility];  Service: Endoscopy;  Laterality: N/A;  1020   EYE SURGERY     Removed glass   HAND SURGERY Right    INTRAVASCULAR PRESSURE WIRE/FFR STUDY N/A 10/23/2020   Procedure: INTRAVASCULAR PRESSURE WIRE/FFR STUDY;  Surgeon: Belva Crome, MD;  Location: [place] [facility];  Service: Cardiovascular;  Laterality: N/A;   INTRAVASCULAR PRESSURE WIRE/FFR STUDY N/A 03/30/2021   Procedure: INTRAVASCULAR PRESSURE WIRE/FFR STUDY;  Surgeon: Nelva Bush, MD;  Location: [place] [facility];  Service: Cardiovascular;  Laterality: N/A;   Left breast lumpectomy     Benign   LEFT HEART CATH AND CORONARY ANGIOGRAPHY N/A 10/25/2018   Procedure: LEFT HEART CATH AND CORONARY ANGIOGRAPHY;  Surgeon: Wellington Hampshire, MD;  Location: [place] [facility];  Service: Cardiovascular;  Laterality: N/A;   LEFT HEART CATH AND CORONARY ANGIOGRAPHY N/A 10/23/2020  Procedure: LEFT HEART CATH AND CORONARY ANGIOGRAPHY;  Surgeon: Belva Crome, MD;  Location: [place] [facility];  Service: Cardiovascular;  Laterality: N/A;   LEFT HEART CATH AND CORONARY ANGIOGRAPHY N/A 03/30/2021   Procedure: LEFT HEART CATH AND CORONARY ANGIOGRAPHY;  Surgeon: Nelva Bush, MD;  Location: [place] [facility];  Service: Cardiovascular;  Laterality: N/A;   LEFT HEART CATH AND CORONARY ANGIOGRAPHY N/A 01/22/2022   Procedure: LEFT HEART CATH AND CORONARY ANGIOGRAPHY;  Surgeon: Sherren Mocha, MD;  Location: [place] [facility];  Service: Cardiovascular;  Laterality: N/A;   LEFT HEART CATHETERIZATION WITH CORONARY ANGIOGRAM N/A 07/03/2014   Procedure: LEFT HEART CATHETERIZATION WITH CORONARY ANGIOGRAM;  Surgeon: Belva Crome, MD;  Location: [facility];  Service: Cardiovascular;  Laterality: N/A;   POLYPECTOMY  01/05/2017   Procedure: POLYPECTOMY;  Surgeon: Rogene Houston, MD;  [facility];   Service: Endoscopy;;  colon   SHOULDER SURGERY Left     Current Medications: Current Meds  Medication Sig   albuterol (VENTOLIN HFA) 108 (90 Base) MCG/ACT inhaler Inhale 2 puffs into the lungs every 6 (six) hours as needed for wheezing or shortness of breath.   ALPRAZolam (XANAX) 0.5 MG tablet Take 1 tablet (0.5 mg total) by mouth 2 (two) times daily as needed for anxiety.   amLODipine (NORVASC) 10 MG tablet TAKE 1 TABLET ONCE DAILY.   aspirin EC 81 MG tablet Take 1 tablet (81 mg total) by mouth daily.   blood glucose meter kit and supplies KIT Use to check blood sugar daily.   carvedilol (COREG) 12.5 MG tablet Take 1 tablet (12.5 mg total) by mouth 2 (two) times daily.   Cholecalciferol (DIALYVITE VITAMIN D 5000) 125 MCG (5000 UT) capsule Take 5,000 Units by mouth daily.   clopidogrel (PLAVIX) 75 MG tablet Take 75 mg by mouth daily.   diphenhydrAMINE (BENADRYL) 25 MG tablet Take 25 mg by mouth every 6 (six) hours as needed for allergies.   escitalopram (LEXAPRO) 20 MG tablet Take 1 tablet (20 mg total) by mouth daily.   Evolocumab (REPATHA SURECLICK [place]) Inject 352 mg into the skin every 14 (fourteen) days.   fluorometholone (FML) 0.1 % ophthalmic suspension daily.   fluticasone-salmeterol (ADVAIR) 100-50 MCG/ACT AEPB INHALE 1 PUFF BY MOUTH TWICE DAILY   hydrochlorothiazide (HYDRODIURIL) 25 MG tablet Take 25 mg by mouth every morning.   isosorbide mononitrate (IMDUR) 60 MG 24 hr tablet Take 1 tablet (60 mg total) by mouth 2 (two) times daily.   LINZESS 145 MCG CAPS capsule Take 1 capsule (145 mcg total) by mouth daily.   nitroGLYCERIN (NITROSTAT) 0.4 MG SL tablet DISSOLVE 1 TABLET UNDER THE TONGUE AS NEEDED FOR CHEST PAIN EVERY 5 MINUTES UP TO 3 TIMES. IF NO RELIEF CALL 911.   potassium chloride SA (KLOR-CON M) 20 MEQ tablet Take one tablet by mouth now, and one tablet by mouth at 6 PM today.   prednisoLONE acetate (PRED FORTE) 1 % ophthalmic suspension Place 1 drop into both eyes daily as  needed (dry eyes/eye infection symptoms).   RESTASIS 0.05 % ophthalmic emulsion daily.   [DISCONTINUED] metoprolol tartrate (LOPRESSOR) 25 MG tablet Take 1 tablet (25 mg total) by mouth 2 (two) times daily. May take additional half tablet as needed daily for palpitations.   Current Facility-Administered Medications for the 01/29/22 encounter (Office Visit) with Loel Dubonnet, NP  Medication   NONFORMULARY OR COMPOUNDED ITEM 0.2 mL     Allergies:   Bee venom,  Lipitor [atorvastatin], Penicillins, Shellfish allergy, Tomato, Valsartan, Zofran [ondansetron], and Crestor [rosuvastatin]   Social History   Socioeconomic History   Marital status: Married    Spouse name: Saralyn Pilar   Number of children: 3   Years of education: Assoc   Highest education level: Not on file  Occupational History   Occupation: Retired    Comment: disability  Tobacco Use   Smoking status: Former    Packs/day: 0.20    Years: 32.00    Total pack years: 6.40    Types: Cigarettes, Cigars    Start date: 07/05/1983    Quit date: 04/03/2012    Years since quitting: 9.8   Smokeless tobacco: Never  Vaping Use   Vaping Use: Never used  Substance and Sexual Activity   Alcohol use: No    Alcohol/week: 0.0 standard drinks of alcohol   Drug use: No   Sexual activity: Yes    Birth control/protection: Surgical    Comment: hyst  Other Topics Concern   Not on file  Social History Narrative   Patient lives at new home with husband and extended family..On disability since age 15 yrs.   Social Determinants of Health   Financial Resource Strain: Medium Risk (01/13/2021)   Overall Financial Resource Strain (CARDIA)    Difficulty of Paying Living Expenses: Somewhat hard  Food Insecurity: No Food Insecurity (01/13/2021)   Hunger Vital Sign    Worried About Running Out of Food in the Last Year: Never true    Ran Out of Food in the Last Year: Never true  Transportation Needs: No Transportation Needs (01/13/2021)   PRAPARE  - Hydrologist (Medical): No    Lack of Transportation (Non-Medical): No  Physical Activity: Sufficiently Active (01/13/2021)   Exercise Vital Sign    Days of Exercise per Week: 7 days    Minutes of Exercise per Session: 60 min  Stress: Stress Concern Present (01/13/2021)   [place]    Feeling of Stress : To some extent  Social Connections: Moderately Isolated (01/13/2021)   Social Connection and Isolation Panel [NHANES]    Frequency of Communication with Friends and Family: Never    Frequency of Social Gatherings with Friends and Family: Never    Attends Religious Services: More than 4 times per year    Active Member of [employer] or Organizations: No    Attends Archivist Meetings: Never    Marital Status: Married     Family History: The patient's family history includes ADD / ADHD in her daughter, son, and son; Alzheimer's disease in her paternal aunt; Aneurysm in her mother; Bipolar disorder in her daughter, son, and son; Cancer in her father, maternal uncle, and paternal aunt; Cirrhosis in her maternal uncle; Colon cancer in her paternal grandfather; Heart attack in her brother; Heart disease in her brother; Mental illness in her daughter, son, and son.  ROS:   Please see the history of present illness.     All other systems reviewed and are negative.  EKGs/Labs/Other Studies Reviewed:    EKG:  No EKG today.  LHC 01/22/22   Conclusion  Final conclusion: Stable multivessel coronary artery disease with chronic total occlusion of the proximal RCA collateralized from the left coronary artery, mild to moderate in-stent restenosis in the LAD (previous flow wire negative), moderately severe OM stenosis and a small OM branch (negative flow wire in past)   No change in the patient's  coronary anatomy over multiple studies.  FloWire analysis has been done demonstrating no flow obstructive  disease on past studies.  Recommend continued medical therapy.  Likely needs escalation of antihypertensive therapy as I suspect hypertension (supply/demand mismatch) is driving her symptoms.   Diagnostic Dominance: Co-dominant     Monitor 11/03/2021 Patch Wear Time:  3 days and 9 hours (2023-07-14T13:09:22-0400 to 2023-07-17T22:28:44-0400)   Patient had a min HR of 45 bpm, max HR of 167 bpm, and avg HR of 69 bpm. Predominant underlying rhythm was Sinus Rhythm. 1 run of Ventricular Tachycardia occurred lasting 4 beats with a max rate of 156 bpm (avg 142 bpm). 5 Supraventricular Tachycardia  runs occurred, the run with the fastest interval lasting 8 beats with a max rate of 167 bpm, the longest lasting 18 beats with an avg rate of 139 bpm. Isolated SVEs were rare (<1.0%), SVE Couplets were rare (<1.0%), and SVE Triplets were rare (<1.0%).  Isolated VEs were occasional (1.1%, 3539), VE Couplets were rare (<1.0%, 12), and no VE Triplets were present. Ventricular Bigeminy and Trigeminy were present. Inverted QRS complexes possibly due to inverted placement of device.   Monitor shows a short 4 beat run of NSVT. Several short PSVT runs were noted. Infrequent PVC's and PAC's. No afib.  Carotid 10/28/21 Summary:  Right Carotid: The extracranial vessels were near-normal with only minimal  wall  thickening or plaque.   Left Carotid: The extracranial vessels were near-normal with only minimal  wall  thickening or plaque.   Vertebrals:  Bilateral vertebral arteries demonstrate antegrade flow.  Subclavians: Normal flow hemodynamics were seen in bilateral subclavian  arteries.   Recent Labs: 01/20/2022: Hemoglobin 13.9; Platelets 251 01/22/2022: BUN 6; Creatinine, Ser 0.78; Potassium 4.3; Sodium 140   Recent Lipid Panel    Component Value Date/Time   CHOL 153 01/20/2022 1446   TRIG 85 01/20/2022 1446   HDL 74 01/20/2022 1446   CHOLHDL 2.1 01/20/2022 1446   VLDL 17 01/20/2022 1446   LDLCALC  62 01/20/2022 1446   LDLCALC 146 (H) 07/03/2020 1334    Physical Exam:   VS:  BP (!) 148/82 (BP Location: Right Arm, Patient Position: Sitting, Cuff Size: Large)   Ht $R'5\' 4"'gD$  (1.626 m)   Wt 153 lb (69.4 kg)   BMI 26.26 kg/m  , BMI Body mass index is 26.26 kg/m. GENERAL:  Well appearing HEENT: Pupils equal round and reactive, fundi not visualized, oral mucosa unremarkable NECK:  No jugular venous distention, waveform within normal limits, carotid upstroke brisk and symmetric, no bruits, no thyromegaly LYMPHATICS:  No cervical adenopathy LUNGS:  Clear to auscultation bilaterally HEART:  RRR.  PMI not displaced or sustained,S1 and S2 within normal limits, no S3, no S4, no clicks, no rubs, no murmurs ABD:  Flat, positive bowel sounds normal in frequency in pitch, no bruits, no rebound, no guarding, no midline pulsatile mass, no hepatomegaly, no splenomegaly EXT:  2 plus pulses throughout, no edema, no cyanosis no clubbing SKIN:  No rashes no nodules. R groin with no erythema nor evidence of infection. There is 0.5" in diameter tender mass that likely reflect Mynx closure device.  NEURO:  Cranial nerves II through XII grossly intact, motor grossly intact throughout PSYCH:  Cognitively intact, oriented to person place and time   ASSESSMENT/PLAN:    HTN - BP not at goal <130/80. No prior secondary hypertension workup. Noted to be markedly elevated during [facility] 01/22/22 thought to be contributory to chest pain with supply/demand mismatch. Medication  management: Continue Amlodipine 10mg  daily, HCTZ 25mg  QD, Imdur 60mg  BID. STOP Metoprolol. START Carvedilol 12.5mg  BID.  Snores - in lab sleep study as below Plan for renal artery duplex to rule out renal artery stenosis Labs within the next week: cortisol, catecholamines, metanephrines, renin-aldosterone. Will hold HCTZ 48 hours prior to labs.  10/2021 MR abdomen "adrenal glands appear normal. No suspicious renal mass or hydronephrosis identified  bilaterally. Tiny cortical cyst in lower pole right kidney" Enrolled in remote patient monitoring today. Will route to Dr. Oval Linsey for consideration of renal denervation study.  Discuss possible referral to PREP at follow up. Presently participating in outpatient PT.  Leg cramps - Atypical for claudication. No indication for ABI. Recent potassium normal. Update magnesium.  Snores / Sleep disordered breathing - STOP Bang 5. Notes snoring and daytime somnolence. Never on CPAP. Last sleep study >45 years old. Plan for in lab sleep study. Sleep hygiene encouraged.   Hypothyroidism - Not presently on levothyroxine, unclear why. Recommend discuss with PCP. 10/2021 TSH 5.165.  CAD - Prior DES RCA 2013, DES LAD 2014. Most recent cath 01/2022 stable multi vessel CAD with CTO prox RCA collateralized from LAD, mild to moderate ISR of LAD (previous flow wire negative), moderately severe OM and small OM branch (prior negative flow wire). Hypertension (supply/demand mismatch) thought to be driving her symptoms - management below.   GDMT Aspirin, Plavix, Coreg, Imdur, Repatha. Heart healthy diet and regular cardiovascular exercise encouraged.    Palpitations - Monitor 11/03/2021 predominantly normal sinus rhythm, 1 run of VT lasting 4 beats, 5 runs of SVT.  Infrequent PVCs/PAC.  Average heart rate 69 bpm.  No recent palpitations. Transition from Metoprolol to Carvedilol, as above.  Stress - Notes multiple stressors related to her husband. Discussed role stress could play in blood pressure. Refer to psychology Dr. Conception Chancy, PsyD.   HLD, LDL goal <70 - Statin intolerant. 01/20/22 LDL 62. On Repatha.   Hx of CVA - Continue Repatha, aspirin. Continues to follow with PT.  Screening for Secondary Hypertension:     01/29/2022   12:23 PM  Causes  Renovascular HTN Screened     - Comments 01/2022 renal duplex ordered  Sleep Apnea Screened     - Comments 01/2022 in lab sleep study  Thyroid Disease  Screened     - Comments 10/2021 monitored by PCP  Hyperaldosteronism Screened     - Comments 01/2022 renin-aldosterone ordered  Pheochromocytoma Screened     - Comments prior MRI abdomen normal kidney no tumor noted  Cushing's Syndrome Screened     - Comments 01/2022 cortisol  Coarctation of the Aorta Screened     - Comments 2023 carotid duplex no stenosis  Compliance Screened    Relevant Labs/Studies:    Latest Ref Rng & Units 01/22/2022   10:42 AM 01/20/2022    2:45 PM 05/22/2021    8:30 PM  Basic Labs  Sodium 135 - 145 mmol/L 140  142  139   Potassium 3.5 - 5.1 mmol/L 4.3  3.2  3.3   Creatinine 0.44 - 1.00 mg/dL 0.78  0.77  0.62        Latest Ref Rng & Units 07/03/2020    1:34 PM 05/20/2020    1:53 PM  Thyroid   TSH mIU/L 1.84  4.24                 01/29/2022   11:11 AM  Renovascular   Renal Artery [place] Completed Yes  she consents to be monitored in our remote patient monitoring program through [place].  she will track his blood pressure twice daily and understands that these trends will help [place] to adjust her medications as needed prior to his next appointment.   Disposition:    FU with PharmD in 2 months and with Dr. Oval Linsey in 4 months   Medication Adjustments/Labs and Tests Ordered: Current medicines are reviewed at length with the patient today.  Concerns regarding medicines are outlined above.  Orders Placed This Encounter  Procedures   Aldosterone + renin activity w/ ratio   Magnesium   Catecholamines, fractionated, plasma   Metanephrines, plasma   Cortisol   Ambulatory referral to Psychology   Cantril's Ladder Assessment   Split night study   VAS US RENAL ARTERY DUPLEX   Meds ordered this encounter  Medications   carvedilol (COREG) 12.5 MG tablet    Sig: Take 1 tablet (12.5 mg total) by mouth 2 (two) times daily.    Dispense:  60 tablet    Refill:  2    D/C METOPROLOL    Order Specific Question:   Supervising Provider    Answer:   Buford Dresser [[id number]]   Signed, Loel Dubonnet, NP  01/29/2022 12:24 PM    [place]

## 2022-02-02 ENCOUNTER — Telehealth: Payer: Self-pay

## 2022-02-02 DIAGNOSIS — Z0000 Encounter for general adult medical examination without abnormal findings: Secondary | ICD-10-CM

## 2022-02-02 NOTE — Telephone Encounter (Signed)
Called patient for [employer] welcome call and to discuss health coaching per survey responses. Patient did not have a voicemail set up and was unable to leave a message. Sent a message to patient via [place] regarding returning call.    Tamantha Saline [facility] Guide, Health Coach [address], [address] [place] [address] Telephone: [phone] Email: [email or web address]

## 2022-02-04 ENCOUNTER — Ambulatory Visit (INDEPENDENT_AMBULATORY_CARE_PROVIDER_SITE_OTHER): Payer: Medicare Other | Admitting: Orthopaedic Surgery

## 2022-02-04 ENCOUNTER — Encounter: Payer: Self-pay | Admitting: Orthopaedic Surgery

## 2022-02-04 VITALS — Ht 64.0 in | Wt 153.0 lb

## 2022-02-04 DIAGNOSIS — I739 Peripheral vascular disease, unspecified: Secondary | ICD-10-CM | POA: Diagnosis not present

## 2022-02-04 DIAGNOSIS — M25562 Pain in left knee: Secondary | ICD-10-CM

## 2022-02-04 NOTE — Progress Notes (Signed)
Office Visit Note   Patient: Bethany Wang           Date of Birth: 17-Mar-1966           MRN: [id number] Visit Date: 02/04/2022              Requested by: [place], [place],  [place] [id number] PCP: [place], MD   Assessment & Plan: Visit Diagnoses:  1. Claudication ([place])   2. Left knee pain, unspecified chronicity     Plan: We will proceed with some arterial Dopplers to evaluate arterial supply lower extremities.  Intra-articular injection performed left knee which she tolerated well.  If she has persistent problems we can obtain an MRI scan of her left knee.  Follow-Up Instructions: No follow-ups on file.   Orders:  Orders Placed This Encounter  Procedures   Large Joint Inj   VAS [place] LOWER EXTREMITY ARTERIAL DUPLEX   No orders of the defined types were placed in this encounter.     Procedures: Large Joint Inj: L knee on 02/04/2022 10:12 AM Indications: joint swelling and pain Details: 22 G 1.5 in needle, anterolateral approach  Arthrogram: No  Medications: 0.5 mL lidocaine 1 %; 3 mL bupivacaine 0.5 %; 40 mg methylPREDNISolone acetate 40 MG/ML Outcome: tolerated well, no immediate complications Procedure, treatment alternatives, risks and benefits explained, specific risks discussed. Consent was given by the patient. Immediately prior to procedure a time out was called to verify the correct patient, procedure, equipment, support staff and site/side marked as required. Patient was prepped and draped in the usual sterile fashion.       Clinical Data: No additional findings.   Subjective: Chief Complaint  Patient presents with   Left Knee - Follow-up    HPI 56 year old female returns she states she has had recent hospitalization for cardiac catheterization.  She has occlusion RCA has had previous stents.  She has had increased problems with left knee multiple falls and states her left knee catches off-and-on with more lateral  to medial joint line pain of the left knee.  She also has problems with feet being cold when she walks a lot her legs get weak.  She denies groin pain.  She already has known coronary artery disease unstable angina and vertebrobasilar artery syndrome.  Review of records do not show any lower extremity arterial Dopplers arteriogram.  She has to wear 2 pairs socks.  She describes claudication stops at rest.  She has had x-rays at family practice office I was able to review them on PACS and this shows minimal joint narrowing no acute changes and only tiny lateral spurs without joint space narrowing.    Review of Systems all other systems noncontributory to HPI.  History of tobacco abuse, unstable angina, syncope.   Objective: Vital Signs: Ht '5\' 4"'$  (1.626 m)   Wt 153 lb (69.4 kg)   BMI 26.26 kg/m   Physical Exam Constitutional:      Appearance: She is well-developed.  HENT:     Head: Normocephalic.     Right Ear: External ear normal.     Left Ear: External ear normal. There is no impacted cerumen.  Eyes:     Pupils: Pupils are equal, round, and reactive to light.  Neck:     Thyroid: No thyromegaly.     Trachea: No tracheal deviation.  Cardiovascular:     Rate and Rhythm: Normal rate.  Pulmonary:     Effort:  Pulmonary effort is normal.  Abdominal:     Palpations: Abdomen is soft.  Musculoskeletal:     Cervical back: No rigidity.  Skin:    General: Skin is warm and dry.  Neurological:     Mental Status: She is alert and oriented to person, place, and time.  Psychiatric:        Behavior: Behavior normal.     Ortho Exam patient has significant lateral joint line tenderness normal patellar tracking no crepitus negative logroll the hips.  I have a palpable posterior tib.  Both feet are cold.  Slow capillary refill.  Specialty Comments:  No specialty comments available.  Imaging: No results found.   PMFS History: Patient Active Problem List   Diagnosis Date Noted   Pain in  left knee 02/04/2022   Contusion of left knee 12/31/2021   Trochanteric bursitis, right hip 09/03/2021   Encounter for screening fecal occult blood testing 01/13/2021   Patient left without being seen 01/13/2021   S/P hysterectomy 01/13/2021   Urinary frequency 01/13/2021   Loss of weight 05/20/2020   Abdominal pain 05/20/2020   Trichimoniasis 08/20/2019   Screening examination for STD (sexually transmitted disease) 08/15/2019   Vaginal itching 08/15/2019   Hypertension 08/15/2019   Vitamin D deficiency disease 04/03/2019   Decreased libido 12/25/2018   Angina pectoris ([place]) 10/25/2018   Palpitations 06/06/2018   Chest pain 07/13/2017   Nonspecific chest pain 07/13/2017   Mixed hyperlipidemia 07/13/2017   Precordial chest pain    Hypertensive urgency 06/15/2016   Unstable angina pectoris ([place]) 06/15/2016   Unstable angina ([place]) 06/15/2016   Bilateral breast cysts 12/30/2015   Vaginal discharge 09/09/2015   BV (bacterial vaginosis) 09/09/2015   Shingles 09/09/2015   History of breast cancer 09/09/2015   Syncope and collapse 08/08/2015   Migraine with aura and with status migrainosus, not intractable 08/08/2015   Chronic low back pain 08/08/2015   Insomnia 08/08/2015   Anxiety state 08/08/2015   Panic attacks 08/08/2015   Anemia, iron deficiency 08/08/2015   Pica 08/08/2015   Angina decubitus ([place]) 07/03/2014   Vertebrobasilar artery syndrome 03/06/2014   Encounter for gynecological examination with Papanicolaou smear of cervix 02/04/2014   Syncope 12/14/2013   Cervical disc disorder with radiculopathy of cervical region 02/19/2013   H/O rotator cuff surgery 02/19/2013   Dyspareunia 02/01/2013   Left shoulder pain 01/24/2013   Rotator cuff tear 01/24/2013   Radicular pain 01/24/2013   Labral tear of shoulder 01/24/2013   Complex regional pain syndrome of upper extremity 01/24/2013   Nausea and vomiting 01/18/2013   Rectal bleeding 01/18/2013   Edema of left foot  10/16/2012   Coronary artery disease involving native coronary artery of native heart with angina pectoris ([place])    Essential hypertension, benign    HLD (hyperlipidemia)    Tobacco abuse    Past Medical History:  Diagnosis Date   Anemia    Anxiety    Bilateral breast cysts 12/30/2015   Brain tumor ([place])    Breast cancer ([place])    Left Breast Cancer   Breast disorder    BV (bacterial vaginosis) 09/09/2015   Coronary atherosclerosis of native coronary artery    a. s/p DES to RCA in 2013 b. DES to LAD in 2014 c. cath in 04/2016 showing patent LAD stent with D2 jailed by LAD stent and CTO of RCA with left to right collaterals present   Cyst of pharynx or nasopharynx    [name]  Decreased libido 12/25/2018   Depression    Diabetes mellitus without complication (HCC)    Essential hypertension    Falls    x 3-4 in past year   GERD (gastroesophageal reflux disease)    Headache    Heart attack (HCC)    x4   History of hematuria    Mixed hyperlipidemia    Personal history of chemotherapy    Left Breast Cancer   Personal history of radiation therapy    Left Breast Cancer   Pre-diabetes    PUD (peptic ulcer disease)    Seizures ([place])    Shingles 09/09/2015   Stroke ([place])    12-2017 on Plavix, only deficit is headaches   Suicide attempt ([place])    3 attempts in remote past   Trichimoniasis 08/20/2019   Treated 08/20/19    Uterine cancer ([place])    Vitamin D deficiency disease 04/03/2019    Family History  Problem Relation Age of Onset   Colon cancer Paternal Grandfather    Cancer Father    Cancer Maternal Uncle    Alzheimer's disease Paternal Aunt    Cirrhosis Maternal Uncle    Cancer Paternal Aunt        lung   Aneurysm Mother        brain   Heart disease Brother    Heart attack Brother    Mental illness Daughter    ADD / ADHD Daughter    Bipolar disorder Daughter    Mental illness Son    ADD / ADHD Son    Bipolar disorder Son    Mental illness  Son    ADD / ADHD Son    Bipolar disorder Son     Past Surgical History:  Procedure Laterality Date   ABDOMINAL HYSTERECTOMY     BIOPSY  11/28/2019   Procedure: BIOPSY;  Surgeon: Rogene Houston, MD;  [facility];  Service: Endoscopy;;  antral, gastric body    BLADDER SURGERY     BREAST LUMPECTOMY Left    BREAST LUMPECTOMY Right 02/24/2018   Procedure: RIGHT BREAST LUMPECTOMY ERAS PATHWAY;  Surgeon: Jovita Kussmaul, MD;  Location: [facility] OR;  Service: General;  Laterality: Right;   CARDIAC CATHETERIZATION N/A 05/10/2016   Procedure: Left Heart Cath and Coronary Angiography;  Surgeon: Belva Crome, MD;  Location: [facility];  Service: Cardiovascular;  Laterality: N/A;   CAUTERIZE INNER NOSE  07/13/2020   COLONOSCOPY  May 2010   Fleishman: normal rectum, internal hemorrhoids, , benign colonic polyp   COLONOSCOPY N/A 01/05/2017   Procedure: COLONOSCOPY;  Surgeon: Rogene Houston, MD;  [facility];  Service: Endoscopy;  Laterality: N/A;  1:00   ESOPHAGEAL DILATION  11/28/2019   Procedure: ESOPHAGEAL DILATION;  Surgeon: Rogene Houston, MD;  [facility];  Service: Endoscopy;;   ESOPHAGOGASTRODUODENOSCOPY     2001 Dr. Amedeo Plenty: distal esophagitis, small hiatal hernia,. Dr. Tamala Julian 2006? no records available currently, pt also reports  EGD a few years ago with Dr. Gala Romney, do not have these reports anywhere in medical records   ESOPHAGOGASTRODUODENOSCOPY  06/02/2011   FTD:DUKGUR pill impaction as described above s/p dilation of a probable cervical esophageal web/bx abnormal esophageal and gastric mucosa. + H.pylori gastritis    ESOPHAGOGASTRODUODENOSCOPY (EGD) WITH PROPOFOL N/A 11/28/2019   Procedure: ESOPHAGOGASTRODUODENOSCOPY (EGD) WITH PROPOFOL;  Surgeon: Rogene Houston, MD;  [facility];  Service: Endoscopy;  Laterality: N/A;  1020   EYE SURGERY  Removed glass   HAND SURGERY Right    INTRAVASCULAR PRESSURE WIRE/FFR STUDY N/A 10/23/2020    Procedure: INTRAVASCULAR PRESSURE WIRE/FFR STUDY;  Surgeon: Belva Crome, MD;  Location: [facility];  Service: Cardiovascular;  Laterality: N/A;   INTRAVASCULAR PRESSURE WIRE/FFR STUDY N/A 03/30/2021   Procedure: INTRAVASCULAR PRESSURE WIRE/FFR STUDY;  Surgeon: Nelva Bush, MD;  Location: [place] [facility];  Service: Cardiovascular;  Laterality: N/A;   Left breast lumpectomy     Benign   LEFT HEART CATH AND CORONARY ANGIOGRAPHY N/A 10/25/2018   Procedure: LEFT HEART CATH AND CORONARY ANGIOGRAPHY;  Surgeon: Wellington Hampshire, MD;  Location: [facility];  Service: Cardiovascular;  Laterality: N/A;   LEFT HEART CATH AND CORONARY ANGIOGRAPHY N/A 10/23/2020   Procedure: LEFT HEART CATH AND CORONARY ANGIOGRAPHY;  Surgeon: Belva Crome, MD;  Location: [facility];  Service: Cardiovascular;  Laterality: N/A;   LEFT HEART CATH AND CORONARY ANGIOGRAPHY N/A 03/30/2021   Procedure: LEFT HEART CATH AND CORONARY ANGIOGRAPHY;  Surgeon: Nelva Bush, MD;  Location: [place] [facility];  Service: Cardiovascular;  Laterality: N/A;   LEFT HEART CATH AND CORONARY ANGIOGRAPHY N/A 01/22/2022   Procedure: LEFT HEART CATH AND CORONARY ANGIOGRAPHY;  Surgeon: Sherren Mocha, MD;  Location: [place];  Service: Cardiovascular;  Laterality: N/A;   LEFT HEART CATHETERIZATION WITH CORONARY ANGIOGRAM N/A 07/03/2014   Procedure: LEFT HEART CATHETERIZATION WITH CORONARY ANGIOGRAM;  Surgeon: Belva Crome, MD;  Location: [facility];  Service: Cardiovascular;  Laterality: N/A;   POLYPECTOMY  01/05/2017   Procedure: POLYPECTOMY;  Surgeon: Rogene Houston, MD;  [facility];  Service: Endoscopy;;  colon   SHOULDER SURGERY Left    Social History   Occupational History   Occupation: Retired    Comment: disability  Tobacco Use   Smoking status: Former    Packs/day: 0.20    Years: 32.00    Total pack years: 6.40    Types: Cigarettes, Cigars    Start date: 07/05/1983    Quit  date: 04/03/2012    Years since quitting: 9.8   Smokeless tobacco: Never  Vaping Use   Vaping Use: Never used  Substance and Sexual Activity   Alcohol use: No    Alcohol/week: 0.0 standard drinks of alcohol   Drug use: No   Sexual activity: Yes    Birth control/protection: Surgical    Comment: hyst

## 2022-02-05 MED ORDER — METHYLPREDNISOLONE ACETATE 40 MG/ML IJ SUSP
40.0000 mg | INTRAMUSCULAR | Status: AC | PRN
  Administered 2022-02-04: 40 mg via INTRA_ARTICULAR

## 2022-02-05 MED ORDER — BUPIVACAINE HCL 0.5 % IJ SOLN
3.0000 mL | INTRAMUSCULAR | Status: AC | PRN
  Administered 2022-02-04: 3 mL via INTRA_ARTICULAR

## 2022-02-05 MED ORDER — LIDOCAINE HCL 1 % IJ SOLN
0.5000 mL | INTRAMUSCULAR | Status: AC | PRN
  Administered 2022-02-04: .5 mL

## 2022-02-10 DIAGNOSIS — M25559 Pain in unspecified hip: Secondary | ICD-10-CM | POA: Diagnosis not present

## 2022-02-10 DIAGNOSIS — I2510 Atherosclerotic heart disease of native coronary artery without angina pectoris: Secondary | ICD-10-CM | POA: Diagnosis not present

## 2022-02-10 DIAGNOSIS — R7989 Other specified abnormal findings of blood chemistry: Secondary | ICD-10-CM | POA: Diagnosis not present

## 2022-02-10 DIAGNOSIS — R531 Weakness: Secondary | ICD-10-CM | POA: Diagnosis not present

## 2022-02-10 DIAGNOSIS — E039 Hypothyroidism, unspecified: Secondary | ICD-10-CM | POA: Diagnosis not present

## 2022-02-10 DIAGNOSIS — I10 Essential (primary) hypertension: Secondary | ICD-10-CM | POA: Diagnosis not present

## 2022-02-10 DIAGNOSIS — I509 Heart failure, unspecified: Secondary | ICD-10-CM | POA: Diagnosis not present

## 2022-02-10 DIAGNOSIS — E785 Hyperlipidemia, unspecified: Secondary | ICD-10-CM | POA: Diagnosis not present

## 2022-02-10 DIAGNOSIS — D497 Neoplasm of unspecified behavior of endocrine glands and other parts of nervous system: Secondary | ICD-10-CM | POA: Diagnosis not present

## 2022-02-10 DIAGNOSIS — T466X5A Adverse effect of antihyperlipidemic and antiarteriosclerotic drugs, initial encounter: Secondary | ICD-10-CM | POA: Diagnosis not present

## 2022-02-10 DIAGNOSIS — K922 Gastrointestinal hemorrhage, unspecified: Secondary | ICD-10-CM | POA: Diagnosis not present

## 2022-02-10 DIAGNOSIS — G720 Drug-induced myopathy: Secondary | ICD-10-CM | POA: Diagnosis not present

## 2022-02-15 ENCOUNTER — Encounter (HOSPITAL_BASED_OUTPATIENT_CLINIC_OR_DEPARTMENT_OTHER): Payer: Medicaid Other

## 2022-02-16 ENCOUNTER — Other Ambulatory Visit: Payer: Self-pay | Admitting: Radiology

## 2022-02-16 ENCOUNTER — Ambulatory Visit: Payer: Medicaid Other | Admitting: Adult Health

## 2022-02-16 DIAGNOSIS — I10 Essential (primary) hypertension: Secondary | ICD-10-CM | POA: Diagnosis not present

## 2022-02-16 DIAGNOSIS — M25562 Pain in left knee: Secondary | ICD-10-CM

## 2022-02-16 DIAGNOSIS — I739 Peripheral vascular disease, unspecified: Secondary | ICD-10-CM

## 2022-02-17 DIAGNOSIS — E611 Iron deficiency: Secondary | ICD-10-CM | POA: Diagnosis not present

## 2022-02-18 ENCOUNTER — Ambulatory Visit (HOSPITAL_BASED_OUTPATIENT_CLINIC_OR_DEPARTMENT_OTHER): Payer: Medicaid Other | Admitting: Family

## 2022-02-22 ENCOUNTER — Encounter (INDEPENDENT_AMBULATORY_CARE_PROVIDER_SITE_OTHER): Payer: Self-pay | Admitting: Gastroenterology

## 2022-02-23 ENCOUNTER — Telehealth: Payer: Self-pay | Admitting: Internal Medicine

## 2022-02-23 NOTE — Telephone Encounter (Signed)
Returning call to Bethany Wang to let him know that we have not received any documentation on this patient, spoke with representative who was not able to find any record of a fax being sent to [place]. Instructed them to call us back if needed.

## 2022-02-23 NOTE — Telephone Encounter (Signed)
Bethany Wang with [facility] calling to follow up on whether the fax requesting the patient's medications was received. Phone: [phone] Fax: [phone]

## 2022-02-23 NOTE — Telephone Encounter (Signed)
Patient saw C. Walker NP in October 2023 Routed to [facility] triage team - no correspondence on patient received at [facility]

## 2022-02-24 DIAGNOSIS — R040 Epistaxis: Secondary | ICD-10-CM | POA: Diagnosis not present

## 2022-02-24 DIAGNOSIS — H608X3 Other otitis externa, bilateral: Secondary | ICD-10-CM | POA: Diagnosis not present

## 2022-02-24 DIAGNOSIS — I25119 Atherosclerotic heart disease of native coronary artery with unspecified angina pectoris: Secondary | ICD-10-CM | POA: Diagnosis not present

## 2022-02-24 DIAGNOSIS — H903 Sensorineural hearing loss, bilateral: Secondary | ICD-10-CM | POA: Diagnosis not present

## 2022-02-25 ENCOUNTER — Ambulatory Visit (INDEPENDENT_AMBULATORY_CARE_PROVIDER_SITE_OTHER): Payer: Medicare Other

## 2022-02-25 DIAGNOSIS — I10 Essential (primary) hypertension: Secondary | ICD-10-CM | POA: Diagnosis not present

## 2022-02-26 ENCOUNTER — Ambulatory Visit (HOSPITAL_BASED_OUTPATIENT_CLINIC_OR_DEPARTMENT_OTHER): Payer: Medicaid Other | Admitting: Internal Medicine

## 2022-02-26 LAB — METANEPHRINES, PLASMA
Metanephrine, Free: <25 pg/mL (ref 0.0–88.0)
Normetanephrine, Free: 53.2 pg/mL (ref 0.0–244.0)

## 2022-02-26 LAB — ALDOSTERONE + RENIN ACTIVITY W/ RATIO
Aldos/Renin Ratio: 27.3 (ref 0.0–30.0)
Aldosterone: 7.1 ng/dL (ref 0.0–30.0)
Renin Activity, Plasma: 0.26 ng/mL/hr (ref 0.167–5.380)

## 2022-02-26 LAB — CATECHOLAMINES, FRACTIONATED, PLASMA
Dopamine: <30 pg/mL (ref 0–48)
Epinephrine: 39 pg/mL (ref 0–62)
Norepinephrine: 620 pg/mL (ref 0–874)

## 2022-02-26 LAB — MAGNESIUM: Magnesium: 3.2 mg/dL — ABNORMAL HIGH (ref 1.6–2.3)

## 2022-02-26 LAB — CORTISOL: Cortisol: 10.7 ug/dL (ref 6.2–19.4)

## 2022-03-11 DIAGNOSIS — E039 Hypothyroidism, unspecified: Secondary | ICD-10-CM | POA: Diagnosis not present

## 2022-03-11 DIAGNOSIS — E1169 Type 2 diabetes mellitus with other specified complication: Secondary | ICD-10-CM | POA: Diagnosis not present

## 2022-03-11 DIAGNOSIS — E785 Hyperlipidemia, unspecified: Secondary | ICD-10-CM | POA: Diagnosis not present

## 2022-03-18 ENCOUNTER — Ambulatory Visit (INDEPENDENT_AMBULATORY_CARE_PROVIDER_SITE_OTHER): Payer: Medicare Other | Admitting: Psychologist

## 2022-03-18 DIAGNOSIS — F4310 Post-traumatic stress disorder, unspecified: Secondary | ICD-10-CM | POA: Diagnosis not present

## 2022-03-18 DIAGNOSIS — F331 Major depressive disorder, recurrent, moderate: Secondary | ICD-10-CM
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
## 2022-03-18 NOTE — Progress Notes (Signed)
                Yogesh Cominsky, PsyD 

## 2022-03-24 ENCOUNTER — Ambulatory Visit (INDEPENDENT_AMBULATORY_CARE_PROVIDER_SITE_OTHER): Payer: Medicare Other | Admitting: Pharmacist Clinician (PhC)/ Clinical Pharmacy Specialist

## 2022-03-24 ENCOUNTER — Ambulatory Visit (INDEPENDENT_AMBULATORY_CARE_PROVIDER_SITE_OTHER): Payer: Medicare Other

## 2022-03-24 ENCOUNTER — Encounter (HOSPITAL_BASED_OUTPATIENT_CLINIC_OR_DEPARTMENT_OTHER): Payer: Self-pay | Admitting: Pharmacist Clinician (PhC)/ Clinical Pharmacy Specialist

## 2022-03-24 DIAGNOSIS — I10 Essential (primary) hypertension: Secondary | ICD-10-CM

## 2022-03-24 DIAGNOSIS — M25562 Pain in left knee: Secondary | ICD-10-CM | POA: Diagnosis not present

## 2022-03-24 DIAGNOSIS — I739 Peripheral vascular disease, unspecified: Secondary | ICD-10-CM | POA: Diagnosis not present

## 2022-03-24 MED ORDER — CHLORTHALIDONE 25 MG PO TABS: 25.0000 mg | ORAL_TABLET | Freq: Every day | ORAL | 3 refills | Status: DC

## 2022-03-24 NOTE — Patient Instructions (Signed)
Go to the lab in 2 weeks (week after Christmas)  Take your BP meds as follows:  We are going to switch out the hydrochlorothiazide to chlorthalidone 25 mg once daily  Continue with all other medications  Check your blood pressure at home daily  and keep record of the readings.  Hypertension "High blood pressure"  Hypertension is often called "The Silent Killer." It rarely causes symptoms until it is extremely  high or has done damage to other organs in the body. For this reason, you should have your  blood pressure checked regularly by your physician. We will check your blood pressure  every time you see a provider at one of our offices.   Your blood pressure reading consists of two numbers. Ideally, blood pressure should be  below 120/80. The first ("top") number is called the systolic pressure. It measures the  pressure in your arteries as your heart beats. The second ("bottom") number is called the diastolic pressure. It measures the pressure in your arteries as the heart relaxes between beats.  The benefits of getting your blood pressure under control are enormous. A 10-point  reduction in systolic blood pressure can reduce your risk of stroke by 27% and heart failure by 28%  Your blood pressure goal is 130/80  To check your pressure at home you will need to:  1. Sit up in a chair, with feet flat on the floor and back supported. Do not cross your ankles or legs. 2. Rest your left arm so that the cuff is about heart level. If the cuff goes on your upper arm,  then just relax the arm on the table, arm of the chair or your lap. If you have a wrist cuff, we  suggest relaxing your wrist against your chest (think of it as Pledging the Flag with the  wrong arm).  3. Place the cuff snugly around your arm, about 1 inch above the crook of your elbow. The  cords should be inside the groove of your elbow.  4. Sit quietly, with the cuff in place, for about 5 minutes. After that 5 minutes  press the power  button to start a reading. 5. Do not talk or move while the reading is taking place.  6. Record your readings on a sheet of paper. Although most cuffs have a memory, it is often  easier to see a pattern developing when the numbers are all in front of you.  7. You can repeat the reading after 1-3 minutes if it is recommended  Make sure your bladder is empty and you have not had caffeine or tobacco within the last 30 min  Always bring your blood pressure log with you to your appointments. If you have not brought your monitor in to be double checked for accuracy, please bring it to your next appointment.  You can find a list of quality blood pressure cuffs at validatebp.org
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
## 2022-03-24 NOTE — Assessment & Plan Note (Addendum)
Assessment: BP is un/controlled in office BP 164/103 mmHg above the goal (<130/80). She has a large amount of stress on her daily life with spousal issues Tolerates current medications well without any side effects  Denies SOB, palpitation, chest pain, headaches,or swelling Reiterated the importance of regular exercise and low salt diet   Plan:  Stop taking hydrochlorothiazide (HCTZ) 25 mg Start taking chlorthalidone 25 mg daily  Continue taking all other medications Patient to keep record of BP readings with heart rate and report to [place] at the next visit Patient to see Dr. Oval Linsey in February for follow up  Follow up lab(s) BMET in 2 weeks

## 2022-03-26 DIAGNOSIS — I25119 Atherosclerotic heart disease of native coronary artery with unspecified angina pectoris: Secondary | ICD-10-CM | POA: Diagnosis not present

## 2022-03-30 ENCOUNTER — Telehealth (HOSPITAL_COMMUNITY): Payer: Self-pay | Admitting: Psychiatry

## 2022-03-30 NOTE — Telephone Encounter (Signed)
D:  Pt's name appeared within [employer]que; front desk requested MH-IOP case manager to reach out to pt re: [place].  A:  Placed call to orient pt.  According to pt, she had a stroke back in summer 2021.  "I am not familiar with computers.  I can't do anything within my chart.  It gets very confusing for me and it frustrates me."  Pt inquired if we had anything in person?  Expressed to pt that unfortunately both MH-IOP and PHP are virtual.  Mentioned to pt that [facility] [phone] and [facility] are the only IOP/PHP's that cm knew of that are in person.  Pt states she will go back to her therapist (Dr. Michail Sermon).  Inform Butch Penny (front desk) of disposition.

## 2022-03-31 DIAGNOSIS — R109 Unspecified abdominal pain: Secondary | ICD-10-CM | POA: Diagnosis not present

## 2022-03-31 DIAGNOSIS — I509 Heart failure, unspecified: Secondary | ICD-10-CM | POA: Diagnosis not present

## 2022-03-31 DIAGNOSIS — I2510 Atherosclerotic heart disease of native coronary artery without angina pectoris: Secondary | ICD-10-CM | POA: Diagnosis not present

## 2022-03-31 DIAGNOSIS — R7989 Other specified abnormal findings of blood chemistry: Secondary | ICD-10-CM | POA: Diagnosis not present

## 2022-03-31 DIAGNOSIS — Z0001 Encounter for general adult medical examination with abnormal findings: Secondary | ICD-10-CM | POA: Diagnosis not present

## 2022-03-31 DIAGNOSIS — T466X5A Adverse effect of antihyperlipidemic and antiarteriosclerotic drugs, initial encounter: Secondary | ICD-10-CM | POA: Diagnosis not present

## 2022-03-31 DIAGNOSIS — E039 Hypothyroidism, unspecified: Secondary | ICD-10-CM | POA: Diagnosis not present

## 2022-03-31 DIAGNOSIS — Z1389 Encounter for screening for other disorder: Secondary | ICD-10-CM | POA: Diagnosis not present

## 2022-03-31 DIAGNOSIS — E785 Hyperlipidemia, unspecified: Secondary | ICD-10-CM | POA: Diagnosis not present

## 2022-03-31 DIAGNOSIS — D497 Neoplasm of unspecified behavior of endocrine glands and other parts of nervous system: Secondary | ICD-10-CM | POA: Diagnosis not present

## 2022-03-31 DIAGNOSIS — I10 Essential (primary) hypertension: Secondary | ICD-10-CM | POA: Diagnosis not present

## 2022-04-07 DIAGNOSIS — M8589 Other specified disorders of bone density and structure, multiple sites: Secondary | ICD-10-CM | POA: Diagnosis not present

## 2022-04-07 DIAGNOSIS — M810 Age-related osteoporosis without current pathological fracture: Secondary | ICD-10-CM | POA: Diagnosis not present

## 2022-04-07 DIAGNOSIS — Z1231 Encounter for screening mammogram for malignant neoplasm of breast: Secondary | ICD-10-CM | POA: Diagnosis not present

## 2022-04-11 DIAGNOSIS — E785 Hyperlipidemia, unspecified: Secondary | ICD-10-CM | POA: Diagnosis not present

## 2022-04-11 DIAGNOSIS — E1169 Type 2 diabetes mellitus with other specified complication: Secondary | ICD-10-CM | POA: Diagnosis not present

## 2022-04-11 DIAGNOSIS — E039 Hypothyroidism, unspecified: Secondary | ICD-10-CM | POA: Diagnosis not present

## 2022-04-25 ENCOUNTER — Other Ambulatory Visit: Payer: Self-pay

## 2022-04-25 ENCOUNTER — Emergency Department (HOSPITAL_COMMUNITY)
Admission: EM | Admit: 2022-04-25 | Discharge: 2022-04-25 | Disposition: A | Discharge status: Home / Self Care | Payer: 59 | Attending: Emergency Medicine | Admitting: Emergency Medicine

## 2022-04-25 ENCOUNTER — Encounter (HOSPITAL_COMMUNITY): Payer: Self-pay

## 2022-04-25 ENCOUNTER — Emergency Department (HOSPITAL_COMMUNITY): Payer: 59

## 2022-04-25 DIAGNOSIS — R0602 Shortness of breath: Secondary | ICD-10-CM | POA: Insufficient documentation

## 2022-04-25 DIAGNOSIS — Z79899 Other long term (current) drug therapy: Secondary | ICD-10-CM | POA: Diagnosis not present

## 2022-04-25 DIAGNOSIS — R0789 Other chest pain: Secondary | ICD-10-CM | POA: Diagnosis not present

## 2022-04-25 DIAGNOSIS — Z7982 Long term (current) use of aspirin: Secondary | ICD-10-CM | POA: Insufficient documentation

## 2022-04-25 DIAGNOSIS — I10 Essential (primary) hypertension: Secondary | ICD-10-CM | POA: Diagnosis not present

## 2022-04-25 DIAGNOSIS — R059 Cough, unspecified: Secondary | ICD-10-CM | POA: Diagnosis not present

## 2022-04-25 DIAGNOSIS — I959 Hypotension, unspecified: Secondary | ICD-10-CM | POA: Diagnosis not present

## 2022-04-25 DIAGNOSIS — R0689 Other abnormalities of breathing: Secondary | ICD-10-CM | POA: Diagnosis not present

## 2022-04-25 LAB — TROPONIN I (HIGH SENSITIVITY)
Troponin I (High Sensitivity): 4 ng/L (ref <18)
Troponin I (High Sensitivity): 3 ng/L (ref <18)

## 2022-04-25 LAB — BASIC METABOLIC PANEL
Anion gap: 7 (ref 5–15)
BUN: 11 mg/dL (ref 6–20)
CO2: 27 mmol/L (ref 22–32)
Calcium: 9.5 mg/dL (ref 8.9–10.3)
Chloride: 107 mmol/L (ref 98–111)
Creatinine, Ser: 0.77 mg/dL (ref 0.44–1.00)
GFR, Estimated: >60 mL/min (ref >60)
Glucose, Bld: 100 mg/dL — ABNORMAL HIGH (ref 70–99)
Potassium: 3.3 mmol/L — ABNORMAL LOW (ref 3.5–5.1)
Sodium: 141 mmol/L (ref 135–145)

## 2022-04-25 LAB — CBC
HCT: 43.5 % (ref 36.0–46.0)
Hemoglobin: 13.8 g/dL (ref 12.0–15.0)
MCH: 28 pg (ref 26.0–34.0)
MCHC: 31.7 g/dL (ref 30.0–36.0)
MCV: 88.2 fL (ref 80.0–100.0)
Platelets: 242 10*3/uL (ref 150–400)
RBC: 4.93 MIL/uL (ref 3.87–5.11)
RDW: 14.1 % (ref 11.5–15.5)
WBC: 6.6 10*3/uL (ref 4.0–10.5)
nRBC: 0 % (ref 0.0–0.2)

## 2022-04-25 MED ORDER — ALBUTEROL SULFATE HFA 108 (90 BASE) MCG/ACT IN AERS: 2.0000 | INHALATION_SPRAY | RESPIRATORY_TRACT | 3 refills | Status: AC | PRN

## 2022-04-25 MED ORDER — ALBUTEROL (5 MG/ML) CONTINUOUS INHALATION SOLN
10.0000 mg | INHALATION_SOLUTION | RESPIRATORY_TRACT | Status: AC
  Administered 2022-04-25 (×2): 10 mg via RESPIRATORY_TRACT
  Filled 2022-04-25: qty 20

## 2022-04-25 NOTE — Discharge Instructions (Addendum)
All of your testing has been reassuring, there is no signs of heart attack, no signs of pneumonia, your blood work was reassuring other than a very slight drop in your potassium level.  If you eat some bananas and potatoes this should come back up.  Please have your family doctor recheck you within 1 week but I do want you to come back to the ER if you are developing severe or worsening symptoms.

## 2022-04-25 NOTE — ED Provider Notes (Signed)
[facility] EMERGENCY DEPARTMENT Provider Note   CSN: [id number] Arrival date & time: 04/25/22  1804     History  Chief Complaint  Patient presents with   Cough    Bethany Wang is a 57 y.o. female.   Cough  This patient is a 57 year old female, the patient comes in today complaining of having some chest tightness, she reports having a multivessel coronary disease history with multiple stents, in fact she had a heart catheterization in October 2023 which showed multivessel coronary disease with stable coronary obstructions and collateral flow around existing ischemia, it was thought at that time that the patient had hypertension induced symptoms and was a candidate for better medical therapy.  She states that she has done well until the last several days, she has had some fluctuating chest pressure, she feels like she has a tightness in her chest and a mild nonproductive cough but no fevers or chills, no phlegm, she does endorse having mild swelling in her legs, she has taken nitroglycerin today but states it is not giving her any relief.  She feels like this is similar to prior coronary events, she has not had any fevers chills or bodyaches.    Home Medications Prior to Admission medications   Medication Sig Start Date End Date Taking? Authorizing Provider  albuterol (VENTOLIN HFA) 108 (90 Base) MCG/ACT inhaler Inhale 2 puffs into the lungs every 4 (four) hours as needed for wheezing or shortness of breath. 04/25/22  Yes Noemi Chapel, MD  ALPRAZolam Duanne Moron) 0.5 MG tablet Take 1 tablet (0.5 mg total) by mouth 2 (two) times daily as needed for anxiety. 09/02/20   Gosrani, Nimish C, MD  amLODipine (NORVASC) 10 MG tablet TAKE 1 TABLET ONCE DAILY. 02/03/21   Thompson Grayer, MD  aspirin EC 81 MG tablet Take 1 tablet (81 mg total) by mouth daily. 07/16/20   Strader, Fransisco Hertz, PA-C  blood glucose meter kit and supplies KIT Use to check blood sugar daily. 11/12/20   Ailene Ards, NP  carvedilol  (COREG) 12.5 MG tablet Take 1 tablet (12.5 mg total) by mouth 2 (two) times daily. 01/29/22 04/29/22  Loel Dubonnet, NP  chlorthalidone (HYGROTON) 25 MG tablet Take 1 tablet (25 mg total) by mouth daily. 03/24/22   Skeet Latch, MD  Cholecalciferol (DIALYVITE VITAMIN D 5000) 125 MCG (5000 UT) capsule Take 5,000 Units by mouth daily.    [provider]  clopidogrel (PLAVIX) 75 MG tablet Take 75 mg by mouth daily.    [provider]  diphenhydrAMINE (BENADRYL) 25 MG tablet Take 25 mg by mouth every 6 (six) hours as needed for allergies.    [provider]  escitalopram (LEXAPRO) 20 MG tablet Take 1 tablet (20 mg total) by mouth daily. 10/21/20   [name], MD  Evolocumab (REPATHA SURECLICK [place]) Inject 496 mg into the skin every 14 (fourteen) days.    [provider]  fluorometholone (FML) 0.1 % ophthalmic suspension daily. 10/22/21   [provider]  fluticasone-salmeterol (ADVAIR) 100-50 MCG/ACT AEPB INHALE 1 PUFF BY MOUTH TWICE DAILY 08/25/20   Hurshel Party C, MD  isosorbide mononitrate (IMDUR) 60 MG 24 hr tablet Take 1 tablet (60 mg total) by mouth 2 (two) times daily. 03/30/21   End, Harrell Gave, MD  LINZESS 145 MCG CAPS capsule Take 1 capsule (145 mcg total) by mouth daily. 10/21/20   [name], MD  nitroGLYCERIN (NITROSTAT) 0.4 MG SL tablet DISSOLVE 1 TABLET UNDER THE TONGUE  AS NEEDED FOR CHEST PAIN EVERY 5 MINUTES UP TO 3 TIMES. IF NO RELIEF CALL 911. 09/05/20   Satira Sark, MD  NP THYROID 60 MG tablet Take 60 mg by mouth every morning. 01/28/22   [provider]  potassium chloride SA (KLOR-CON M) 20 MEQ tablet Take one tablet by mouth now, and one tablet by mouth at 6 PM today. 01/21/22   Hilty, Nadean Corwin, MD  prednisoLONE acetate (PRED FORTE) 1 % ophthalmic suspension Place 1 drop into both eyes daily as needed (dry eyes/eye infection symptoms).    [provider]  RESTASIS 0.05 % ophthalmic emulsion  daily. 10/22/21   [provider]      Allergies    Bee venom, Lipitor [atorvastatin], Penicillins, Shellfish allergy, Tomato, Valsartan, Zofran [ondansetron], Crestor [rosuvastatin], and Ranolazine    Review of Systems   Review of Systems  Respiratory:  Positive for cough.   All other systems reviewed and are negative.   Physical Exam Updated Vital Signs BP 136/81   Pulse 81   Temp 98.5 F (36.9 C) (Oral)   Resp (!) 29   Ht 1.626 m ('5\' 4"'$ )   Wt 68.9 kg   SpO2 94%   BMI 26.09 kg/m  Physical Exam Vitals and nursing note reviewed.  Constitutional:      General: She is not in acute distress.    Appearance: She is well-developed.  HENT:     Head: Normocephalic and atraumatic.     Mouth/Throat:     Pharynx: No oropharyngeal exudate.  Eyes:     General: No scleral icterus.       Right eye: No discharge.        Left eye: No discharge.     Conjunctiva/sclera: Conjunctivae normal.     Pupils: Pupils are equal, round, and reactive to light.  Neck:     Thyroid: No thyromegaly.     Vascular: No JVD.  Cardiovascular:     Rate and Rhythm: Normal rate and regular rhythm.     Heart sounds: Normal heart sounds. No murmur heard.    No friction rub. No gallop.  Pulmonary:     Effort: Pulmonary effort is normal. No respiratory distress.     Breath sounds: Normal breath sounds. No wheezing or rales.  Abdominal:     General: Bowel sounds are normal. There is no distension.     Palpations: Abdomen is soft. There is no mass.     Tenderness: There is no abdominal tenderness.  Musculoskeletal:        General: No tenderness. Normal range of motion.     Cervical back: Normal range of motion and neck supple.     Right lower leg: No edema.     Left lower leg: No edema.  Lymphadenopathy:     Cervical: No cervical adenopathy.  Skin:    General: Skin is warm and dry.     Findings: No erythema or rash.  Neurological:     Mental Status: She is alert.     Coordination:  Coordination normal.  Psychiatric:        Behavior: Behavior normal.     ED Results / Procedures / Treatments   Labs (all labs ordered are listed, but only abnormal results are displayed) Labs Reviewed  BASIC METABOLIC PANEL - Abnormal; Notable for the following components:      Result Value   Potassium 3.3 (*)    Glucose, Bld 100 (*)    All other components  within normal limits  CBC  TROPONIN I (HIGH SENSITIVITY)  TROPONIN I (HIGH SENSITIVITY)    EKG EKG Interpretation  Date/Time:  Sunday April 25 2022 18:17:17 EST Ventricular Rate:  63 PR Interval:  158 QRS Duration: 102 QT Interval:  428 QTC Calculation: 437 R Axis:   33 Text Interpretation: Normal sinus rhythm Minimal voltage criteria for LVH, may be normal variant ( Sokolow-Lyon ) Borderline ECG When compared with ECG of 22-Jan-2022 08:27, Nonspecific T wave abnormality has replaced inverted T waves in Inferior leads Nonspecific T wave abnormality now evident in Anterior leads Confirmed by Noemi Chapel [phone]) on 04/25/2022 6:23:12 PM  Radiology DG Chest 2 View  Result Date: 04/25/2022 CLINICAL DATA:  Cough EXAM: CHEST - 2 VIEW COMPARISON:  05/22/2021 FINDINGS: The heart size and mediastinal contours are within normal limits. Both lungs are clear. The visualized skeletal structures are unremarkable. IMPRESSION: No active cardiopulmonary disease. Electronically Signed   By: Rolm Baptise M.D.   On: 04/25/2022 18:35    Procedures Procedures    Medications Ordered in ED Medications  albuterol (PROVENTIL,VENTOLIN) solution continuous neb (10 mg Nebulization New Bag/Given 04/25/22 1905)    ED Course/ Medical Decision Making/ A&P                             Medical Decision Making Amount and/or Complexity of Data Reviewed Labs: ordered. Radiology: ordered.  Risk Prescription drug management.   This patient's exam is actually rather unremarkable, she does not have any wheezing, she is not coughing, she is not  hypoxic she is not tachycardic. This patient presents to the ED for concern of chest pressure and heaviness, this involves an extensive number of treatment options, and is a complaint that carries with it a high risk of complications and morbidity.  The differential diagnosis includes coronary disease, she is hypertensive with a blood pressure of 185/88 Most likely to be pulmonary embolism or dissection Possibly infectious but has no other infectious symptoms   Co morbidities that complicate the patient evaluation  Known coronary disease, hypertension   Additional history obtained:  Additional history obtained from electronic medical record External records from outside source obtained and reviewed including prior coronary catheterization   Lab Tests:  I Ordered, and personally interpreted labs.  The pertinent results include: CBC metabolic panel in 2 troponins all negative, potassium 3.3 but otherwise unremarkable   Imaging Studies ordered:  I ordered imaging studies including chest x-ray I independently visualized and interpreted imaging which showed no acute infiltrates pneumothorax or abnormal mediastinum I agree with the radiologist interpretation   Cardiac Monitoring: / EKG:  The patient was maintained on a cardiac monitor.  I personally viewed and interpreted the cardiac monitored which showed an underlying rhythm of: Normal sinus rhythm, no ischemia or arrhythmia  Problem List / ED Course / Critical interventions / Medication management  Patient has felt much better after getting albuterol, vital signs remained unremarkable  Vitals:   04/25/22 1810 04/25/22 1816 04/25/22 1930 04/25/22 2030  BP:  (!) 185/88 135/72 136/81  Pulse:  63 66 81  Resp:  18 18 (!) 29  Temp:  98.5 F (36.9 C)    TempSrc:  Oral    SpO2:  98% 100% 94%  Weight: 68.9 kg     Height: 1.626 m ('5\' 4"'$ )      Blood pressure rechecked and has come back into a normal range  I ordered medication  including  albuterol for shortness of breath and chest tightness Reevaluation of the patient after these medicines showed that the patient significant improvement I have reviewed the patients home medicines and have made adjustments as needed   Social Determinants of Health:  None   Test / Admission - Considered:  Stated admission but the patient is feeling better after nebulized treatments and has no signs of pneumonia, no signs of acute ischemia on EKG or lab work.  She is agreeable to return should symptoms worsen Most likely some type of viral syndrome or reactive airway disease         Final Clinical Impression(s) / ED Diagnoses Final diagnoses:  Chest tightness  Shortness of breath    Rx / DC Orders ED Discharge Orders          Ordered    albuterol (VENTOLIN HFA) 108 (90 Base) MCG/ACT inhaler  Every 4 hours PRN        04/25/22 2228              Noemi Chapel, MD 04/25/22 2230

## 2022-04-25 NOTE — ED Triage Notes (Signed)
Pt presents to ED via [facility], pt reports chest pressure, cough, congestion x last couple of days. Pt reports taking nitro x 4 times today and 4 x yesterday without relief, last at 1200 today. Pt took coricidin on Monday. Pt stated with flu like symptoms on Monday.  12 lead by EMS WNL

## 2022-04-25 NOTE — ED Provider Triage Note (Signed)
Emergency Medicine Provider Triage Evaluation Note  Bethany Wang , a 57 y.o. female  was evaluated in triage.  Pt complains of chest tightness and shortness of breath with a dry cough, history of 8 cardiac stents, denies fevers or chills or sore throat.  Review of Systems  Positive: Coughing chest pain Negative: Negative for fevers vomiting or diarrhea  Physical Exam  BP (!) 185/88 (BP Location: Right Arm)   Pulse 63   Temp 98.5 F (36.9 C) (Oral)   Resp 18   Ht 1.626 m ('5\' 4"'$ )   Wt 68.9 kg   SpO2 98%   BMI 26.09 kg/m  Gen:   Awake, no distress   Resp:  Normal effort, no wheezing rales or rhonchi MSK:   Moves extremities without difficulty, no edema Other:  Abdomen nontender, oropharynx clear, no jaundice  Medical Decision Making  Medically screening exam initiated at 6:37 PM.  Appropriate orders placed.  Bethany Wang was informed that the remainder of the evaluation will be completed by another provider, this initial triage assessment does not replace that evaluation, and the importance of remaining in the ED until their evaluation is complete.  Needs workup for both cardiac and pulmonary causes   Noemi Chapel, MD 04/25/22 Bosie Helper

## 2022-04-27 DIAGNOSIS — I25119 Atherosclerotic heart disease of native coronary artery with unspecified angina pectoris: Secondary | ICD-10-CM | POA: Diagnosis not present

## 2022-05-03 ENCOUNTER — Other Ambulatory Visit (INDEPENDENT_AMBULATORY_CARE_PROVIDER_SITE_OTHER): Payer: Self-pay | Admitting: Internal Medicine

## 2022-05-10 DIAGNOSIS — G4700 Insomnia, unspecified: Secondary | ICD-10-CM | POA: Diagnosis not present

## 2022-05-10 DIAGNOSIS — H8110 Benign paroxysmal vertigo, unspecified ear: Secondary | ICD-10-CM | POA: Diagnosis not present

## 2022-05-26 DIAGNOSIS — Z6824 Body mass index (BMI) 24.0-24.9, adult: Secondary | ICD-10-CM | POA: Diagnosis not present

## 2022-05-26 DIAGNOSIS — J0101 Acute recurrent maxillary sinusitis: Secondary | ICD-10-CM | POA: Diagnosis not present

## 2022-05-27 DIAGNOSIS — Z6824 Body mass index (BMI) 24.0-24.9, adult: Secondary | ICD-10-CM | POA: Diagnosis not present

## 2022-05-27 DIAGNOSIS — G4700 Insomnia, unspecified: Secondary | ICD-10-CM | POA: Diagnosis not present

## 2022-05-27 DIAGNOSIS — H8110 Benign paroxysmal vertigo, unspecified ear: Secondary | ICD-10-CM | POA: Diagnosis not present

## 2022-05-31 ENCOUNTER — Encounter (HOSPITAL_BASED_OUTPATIENT_CLINIC_OR_DEPARTMENT_OTHER): Payer: Self-pay | Admitting: Cardiovascular Disease

## 2022-05-31 ENCOUNTER — Ambulatory Visit (INDEPENDENT_AMBULATORY_CARE_PROVIDER_SITE_OTHER): Payer: 59 | Admitting: Cardiovascular Disease

## 2022-05-31 VITALS — BP 136/75 | HR 76 | Ht 64.0 in | Wt 154.2 lb

## 2022-05-31 DIAGNOSIS — Z5181 Encounter for therapeutic drug level monitoring: Secondary | ICD-10-CM

## 2022-05-31 DIAGNOSIS — R0683 Snoring: Secondary | ICD-10-CM | POA: Diagnosis not present

## 2022-05-31 DIAGNOSIS — I10 Essential (primary) hypertension: Secondary | ICD-10-CM | POA: Diagnosis not present

## 2022-05-31 DIAGNOSIS — I25119 Atherosclerotic heart disease of native coronary artery with unspecified angina pectoris: Secondary | ICD-10-CM | POA: Diagnosis not present

## 2022-05-31 DIAGNOSIS — F4310 Post-traumatic stress disorder, unspecified: Secondary | ICD-10-CM

## 2022-05-31 MED ORDER — CARVEDILOL 25 MG PO TABS: 25.0000 mg | ORAL_TABLET | Freq: Two times a day (BID) | ORAL | 2 refills | Status: DC

## 2022-05-31 MED ORDER — SPIRONOLACTONE 25 MG PO TABS: 25.0000 mg | ORAL_TABLET | Freq: Every day | ORAL | 3 refills | Status: DC

## 2022-05-31 NOTE — Patient Instructions (Addendum)
Medication Instructions:  STOP POTASSIUM   STOP CHLORTHALIDONE  START SPIRONOLACTONE 25 MG DAILY   INCREASE CARVEDILOL TO 25 MG TWICE A DAY   Labwork: BMET IN 1 WEEK   Testing/Procedures: Your physician has recommended that you have a sleep study. This test records several body functions during sleep, including: brain activity, eye movement, oxygen and carbon dioxide blood levels, heart rate and rhythm, breathing rate and rhythm, the flow of air through your mouth and nose, snoring, body muscle movements, and chest and belly movement. IF YOU DO NOT HEAR FROM THEM IN 2 WEEKS CALL THE OFFICE TO FOLLOW UP  Follow-Up: 07/01/2022 8:50 AM WITH CAITLIN W NP   You have been referred to PSYCHOLOGY, IF YOU DO NOT HEAR CALL THE OFFICE CALL THEM DIRECTLY    If you need a refill on your cardiac medications before your next appointment, please call your pharmacy.

## 2022-05-31 NOTE — Progress Notes (Signed)
Advanced Hypertension Clinic Follow-up:    Date:  05/31/2022   ID:  Bethany Wang, DOB [birth year], MRN [id number]  PCP:  Prince Nation, MD  Cardiologist:  Pixie Casino, MD  Nephrologist:  Referring MD: Hazelton Nation, MD   CC: Hypertension  History of Present Illness:    Bethany Wang is a 57 y.o. female with a hx of CAD (s/p DES to RCA 2013, DES to LAD 2014, cath 04/2016 and 10/2020 and 01/2022 patent LAD stent with jailed D2 by LAD stent and CTO RCA to L-R collaterals), hypertension, and diabetes type 2. She was initially seen in the [facility]pert[facility] Clinic by Laurann Montana, NP 01/29/2022. Per her notes, the patient was previously hospitalized 10/2018 and underwent LHC with occlusion of RCA with previously placed coronary stent and left-to-right collaterals; 50% ISR of proximal LAD and 90% diagonal ostial narrowing; also 60-70% obtuse marginal narrowing of 50% distal circumflex disease. She was recommended for medical therapy. Repeat catheterizations 10/2020 and 03/2021 were stable. Imdur was adjusted to 60 mg BID; ranolazine was trialed but discontinued after she developed a rash. She was seen 10/15/2021 and her amlodipine, HCTZ, and thyroid medications were on hold per her PCP but unclear why. Her BP was 203/103 initially, and 190/90 on recheck. She noted having an argument prior to the office visit and declined medication changes. HCTZ and amlodipine was resumed. She reported palpitations and wore a monitor showing predominantly NSR, and infrequent PVCs/PACs. Carotid dopplers 10/2021 showed no significant stenosis. In 01/2022 she developed worsening angina and was seen by Dr. Debara Pickett. She underwent cardiac catheterization 01/22/22  revealing stable multivessel coronary disease with CTO of proximal RCA collateralized from LCA, mild to moderate ISR of the LAD with previous negative flow wire, moderately severe OM stenosis and small OM branch with negative flow wire in the past. It  was suspected her symptoms were driven by supply/demand mismatch and she was recommended for more aggressive antihypertensive treatment. At her initial visit with Laurann Montana, NP her BP was 148/82. She was started on 12.5 mg carvedilol BID, and metoprolol was discontinued. She continued on Amlodipine 65m daily, HCTZ 253mQD, Imdur 603mID. She was enrolled in the RPM study. Cortisol, catecholamines, metanephrines, and renin-aldosterone were normal. Renal artery dopplers 02/2022 revealed mild to moderate right renal artery stenosis without significant stenosis on the left. She reported snoring and was referred for a sleep study which was completed 01/2022. She also has known intolerance to Atorvastatin, Rosuvastatin and Repatha initiated. She followed up with our pharmacist 03/24/2022 and HCTZ was stopped. She was started on 25 mg chlorthalidone daily.  On 04/25/2022 she presented to the ED with complaints of chest tightness. She was noted to be hypertensive at 185/88. Troponins x2 were negative. Monitoring and EKG were negative for ischemia or arrhythmia. Her symptoms improved with albuterol use, and were thought to be due to some type of viral syndrome or reactive airway disease.   Recent blood pressures in [place] have been in the 130s-140s/80s. Today, she states she isn't feeling her best. In clinic today her blood pressure is 136/75. Reportedly she has frequent arguments with her husband which raises her blood pressures. Lately she has had recurring "stress headaches" twice a week associated with facial edema. She will follow up with neurology later this month. She complains of brief twinges of chest pain while at home and resting. While lying down she experiences rapid heart beats. She reports intermittent shortness of breath. One to  two times a week she will use her advair. She has a smoking history since she was 57 yo. She smoked 1 pack in 3 days; she quit in 2017. Currently on antibiotics for  bronchitis. At times she feels a little lightheaded which she believes is a side effect of her regimen. Typically her morning routine includes drinking a cup of coffee, eating a cup of yogurt with fruit and granola, taking her medications, and then a little later she feels woozy. After a while this usually subsides spontaneously; sometimes she continues to feel lazy or malaise for longer. For exercise, she is walking and running around a gym track 3 times a week (6 laps running at a slow pace, and 8 laps walking). Her diet mostly consists of vegetables, fruit, yogurt, and granola. She has chicken once a month and avoids salt. She has difficulty sleeping. Usually she goes to bed around 9 PM, and wakes up at 2 AM and is unable to fall back asleep. Of note, she has seen a therapist and was previously diagnosed with PTSD. She denies any peripheral edema, syncope, orthopnea, or PND.  Previous antihypertensives:   Past Medical History:  Diagnosis Date   Anemia    Anxiety    Bilateral breast cysts 12/30/2015   Brain tumor ([place])    Breast cancer ([place])    Left Breast Cancer   Breast disorder    BV (bacterial vaginosis) 09/09/2015   Coronary atherosclerosis of native coronary artery    a. s/p DES to RCA in 2013 b. DES to LAD in 2014 c. cath in 04/2016 showing patent LAD stent with D2 jailed by LAD stent and CTO of RCA with left to right collaterals present   Cyst of pharynx or nasopharynx    [name]   Decreased libido 12/25/2018   Depression    Diabetes mellitus without complication ([place])    Essential hypertension    Falls    x 3-4 in past year   GERD (gastroesophageal reflux disease)    Headache    Heart attack ([place])    x4   History of hematuria    Mixed hyperlipidemia    Personal history of chemotherapy    Left Breast Cancer   Personal history of radiation therapy    Left Breast Cancer   Pre-diabetes    PUD (peptic ulcer disease)    Seizures ([place])    Shingles  09/09/2015   Stroke ([place])    12-2017 on Plavix, only deficit is headaches   Suicide attempt ([place])    3 attempts in remote past   Trichimoniasis 08/20/2019   Treated 08/20/19    Uterine cancer ([place])    Vitamin D deficiency disease 04/03/2019    Past Surgical History:  Procedure Laterality Date   ABDOMINAL HYSTERECTOMY     BIOPSY  11/28/2019   Procedure: BIOPSY;  Surgeon: Rogene Houston, MD;  [facility];  Service: Endoscopy;;  antral, gastric body    BLADDER SURGERY     BREAST LUMPECTOMY Left    BREAST LUMPECTOMY Right 02/24/2018   Procedure: RIGHT BREAST LUMPECTOMY ERAS PATHWAY;  Surgeon: Jovita Kussmaul, MD;  Location: [facility] OR;  Service: General;  Laterality: Right;   CARDIAC CATHETERIZATION N/A 05/10/2016   Procedure: Left Heart Cath and Coronary Angiography;  Surgeon: Belva Crome, MD;  Location: [place] [facility] LAB;  Service: Cardiovascular;  Laterality: N/A;   CAUTERIZE INNER NOSE  07/13/2020   COLONOSCOPY  May 2010   Fle[place]man:  normal rectum, internal hemorrhoids, , benign colonic polyp   COLONOSCOPY N/A 01/05/2017   Procedure: COLONOSCOPY;  Surgeon: Rogene Houston, MD;  [facility];  Service: Endoscopy;  Laterality: N/A;  1:00   ESOPHAGEAL DILATION  11/28/2019   Procedure: ESOPHAGEAL DILATION;  Surgeon: Rogene Houston, MD;  [facility];  Service: Endoscopy;;   ESOPHAGOGASTRODUODENOSCOPY     2001 Dr. Amedeo Plenty: distal esophagitis, small hiatal hernia,. Dr. Tamala Julian 2006? no records available currently, pt also reports  EGD a few years ago with Dr. Gala Romney, do not have these reports anywhere in medical records   ESOPHAGOGASTRODUODENOSCOPY  06/02/2011   [id number] pill impaction as described above s/p dilation of a probable cervical esophageal web/bx abnormal esophageal and gastric mucosa. + H.pylori gastritis    ESOPHAGOGASTRODUODENOSCOPY (EGD) WITH PROPOFOL N/A 11/28/2019   Procedure: ESOPHAGOGASTRODUODENOSCOPY (EGD) WITH PROPOFOL;  Surgeon: Rogene Houston, MD;  [facility];  Service: Endoscopy;  Laterality: N/A;  1020   EYE SURGERY     Removed glass   HAND SURGERY Right    INTRAVASCULAR PRESSURE WIRE/FFR STUDY N/A 10/23/2020   Procedure: INTRAVASCULAR PRESSURE WIRE/FFR STUDY;  Surgeon: Belva Crome, MD;  Location: [place] [facility];  Service: Cardiovascular;  Laterality: N/A;   INTRAVASCULAR PRESSURE WIRE/FFR STUDY N/A 03/30/2021   Procedure: INTRAVASCULAR PRESSURE WIRE/FFR STUDY;  Surgeon: Nelva Bush, MD;  Location: [place] [facility];  Service: Cardiovascular;  Laterality: N/A;   Left breast lumpectomy     Benign   LEFT HEART CATH AND CORONARY ANGIOGRAPHY N/A 10/25/2018   Procedure: LEFT HEART CATH AND CORONARY ANGIOGRAPHY;  Surgeon: Wellington Hampshire, MD;  Location: [place] [facility];  Service: Cardiovascular;  Laterality: N/A;   LEFT HEART CATH AND CORONARY ANGIOGRAPHY N/A 10/23/2020   Procedure: LEFT HEART CATH AND CORONARY ANGIOGRAPHY;  Surgeon: Belva Crome, MD;  Location: [place] [facility];  Service: Cardiovascular;  Laterality: N/A;   LEFT HEART CATH AND CORONARY ANGIOGRAPHY N/A 03/30/2021   Procedure: LEFT HEART CATH AND CORONARY ANGIOGRAPHY;  Surgeon: Nelva Bush, MD;  Location: [place] [facility] LAB;  Service: Cardiovascular;  Laterality: N/A;   LEFT HEART CATH AND CORONARY ANGIOGRAPHY N/A 01/22/2022   Procedure: LEFT HEART CATH AND CORONARY ANGIOGRAPHY;  Surgeon: Sherren Mocha, MD;  Location: [place] [facility];  Service: Cardiovascular;  Laterality: N/A;   LEFT HEART CATHETERIZATION WITH CORONARY ANGIOGRAM N/A 07/03/2014   Procedure: LEFT HEART CATHETERIZATION WITH CORONARY ANGIOGRAM;  Surgeon: Belva Crome, MD;  Location: [facility] CATH LAB;  Service: Cardiovascular;  Laterality: N/A;   POLYPECTOMY  01/05/2017   Procedure: POLYPECTOMY;  Surgeon: Rogene Houston, MD;  [facility];  Service: Endoscopy;;  colon   SHOULDER SURGERY Left     Current Medications: Current Meds  Medication Sig    albuterol (VENTOLIN HFA) 108 (90 Base) MCG/ACT inhaler Inhale 2 puffs into the lungs every 4 (four) hours as needed for wheezing or shortness of breath.   ALPRAZolam (XANAX) 0.5 MG tablet Take 1 tablet (0.5 mg total) by mouth 2 (two) times daily as needed for anxiety.   amLODipine (NORVASC) 10 MG tablet TAKE 1 TABLET ONCE DAILY.   aspirin EC 81 MG tablet Take 1 tablet (81 mg total) by mouth daily.   blood glucose meter kit and supplies KIT Use to check blood sugar daily.   Cholecalciferol (DIALYVITE VITAMIN D 5000) 125 MCG (5000 UT) capsule Take 5,000 Units by mouth daily.   clopidogrel (PLAVIX)  75 MG tablet Take 75 mg by mouth daily.   diphenhydrAMINE (BENADRYL) 25 MG tablet Take 25 mg by mouth every 6 (six) hours as needed for allergies.   doxycycline (VIBRA-TABS) 100 MG tablet Take 100 mg by mouth 2 (two) times daily.   escitalopram (LEXAPRO) 20 MG tablet Take 1 tablet (20 mg total) by mouth daily.   Evolocumab (REPATHA SURECLICK [place]) Inject [id number] mg into the skin every 14 (fourteen) days.   fluorometholone (FML) 0.1 % ophthalmic suspension daily.   fluticasone-salmeterol (ADVAIR) 100-50 MCG/ACT AEPB INHALE 1 PUFF BY MOUTH TWICE DAILY   isosorbide mononitrate (IMDUR) 60 MG 24 hr tablet Take 1 tablet (60 mg total) by mouth 2 (two) times daily.   LINZESS 145 MCG CAPS capsule Take 1 capsule (145 mcg total) by mouth daily.   nitroGLYCERIN (NITROSTAT) 0.4 MG SL tablet DISSOLVE 1 TABLET UNDER THE TONGUE AS NEEDED FOR CHEST PAIN EVERY 5 MINUTES UP TO 3 TIMES. IF NO RELIEF CALL 911.   NP THYROID 60 MG tablet Take 60 mg by mouth every morning.   prednisoLONE acetate (PRED FORTE) 1 % ophthalmic suspension Place 1 drop into both eyes daily as needed (dry eyes/eye infection symptoms).   RESTASIS 0.05 % ophthalmic emulsion daily.   spironolactone (ALDACTONE) 25 MG tablet Take 1 tablet (25 mg total) by mouth daily.   traZODone (DESYREL) 50 MG tablet Take 1-2 tablets by mouth at bedtime.   [DISCONTINUED]  chlorthalidone (HYGROTON) 25 MG tablet Take 1 tablet (25 mg total) by mouth daily.   [DISCONTINUED] potassium chloride SA (KLOR-CON M) 20 MEQ tablet Take one tablet by mouth now, and one tablet by mouth at 6 PM today.   Current Facility-Administered Medications for the 05/31/22 encounter (Office Visit) with Skeet Latch, MD  Medication   NONFORMULARY OR COMPOUNDED ITEM 0.2 mL     Allergies:   Bee venom, Lipitor [atorvastatin], Penicillins, Shellfish allergy, Tomato, Valsartan, Zofran [ondansetron], Crestor [rosuvastatin], and Ranolazine   Social History   Socioeconomic History   Marital status: Married    Spouse name: Saralyn Pilar   Number of children: 3   Years of education: Assoc   Highest education level: Not on file  Occupational History   Occupation: Retired    Comment: disability  Tobacco Use   Smoking status: Former    Packs/day: 0.20    Years: 32.00    Total pack years: 6.40    Types: Cigarettes, Cigars    Start date: 07/05/1983    Quit date: 04/03/2012    Years since quitting: 10.1   Smokeless tobacco: Never  Vaping Use   Vaping Use: Never used  Substance and Sexual Activity   Alcohol use: No    Alcohol/week: 0.0 standard drinks of alcohol   Drug use: No   Sexual activity: Yes    Birth control/protection: Surgical    Comment: hyst  Other Topics Concern   Not on file  Social History Narrative   Patient lives at new home with husband and extended family..On disability since age 2 yrs.   Social Determinants of Health   Financial Resource Strain: Medium Risk (01/13/2021)   Overall Financial Resource Strain (CARDIA)    Difficulty of Paying Living Expenses: Somewhat hard  Food Insecurity: No Food Insecurity (01/13/2021)   Hunger Vital Sign    Worried About Running Out of Food in the Last Year: Never true    Ran Out of Food in the Last Year: Never true  Transportation Needs: No Transportation Needs (01/13/2021)  PRAPARE - Hydrologist  (Medical): No    Lack of Transportation (Non-Medical): No  Physical Activity: Sufficiently Active (01/13/2021)   Exercise Vital Sign    Days of Exercise per Week: 7 days    Minutes of Exercise per Session: 60 min  Stress: Stress Concern Present (01/13/2021)   [place]    Feeling of Stress : To some extent  Social Connections: Moderately Isolated (01/13/2021)   Social Connection and Isolation Panel [NHANES]    Frequency of Communication with Friends and Family: Never    Frequency of Social Gatherings with Friends and Family: Never    Attends Religious Services: More than 4 times per year    Active Member of [employer] or Organizations: No    Attends Archivist Meetings: Never    Marital Status: Married     Family History: The patient's family history includes ADD / ADHD in her daughter, son, and son; Alzheimer's disease in her paternal aunt; Aneurysm in her mother; Bipolar disorder in her daughter, son, and son; Cancer in her father, maternal uncle, and paternal aunt; Cirrhosis in her maternal uncle; Colon cancer in her paternal grandfather; Heart attack in her brother; Heart disease in her brother; Mental illness in her daughter, son, and son.  ROS:   Please see the history of present illness.    (+) Headaches (+) Chest pain (+) Palpitations (+) Lightheadedness (+) Insomnia All other systems reviewed and are negative.  EKGs/Labs/Other Studies Reviewed:    LHC 01/22/22: Conclusion Final conclusion: Stable multivessel coronary artery disease with chronic total occlusion of the proximal RCA collateralized from the left coronary artery, mild to moderate in-stent restenosis in the LAD (previous flow wire negative), moderately severe OM stenosis and a small OM branch (negative flow wire in past)   No change in the patient's coronary anatomy over multiple studies.  FloWire analysis has been done demonstrating no flow  obstructive disease on past studies.  Recommend continued medical therapy.  Likely needs escalation of antihypertensive therapy as I suspect hypertension (supply/demand mismatch) is driving her symptoms.     Diagnostic Dominance: Co-dominant        Monitor 11/03/2021: Patch Wear Time:  3 days and 9 hours (2023-07-14T13:09:22-0400 to 2023-07-17T22:28:44-0400)   Patient had a min HR of 45 bpm, max HR of 167 bpm, and avg HR of 69 bpm. Predominant underlying rhythm was Sinus Rhythm. 1 run of Ventricular Tachycardia occurred lasting 4 beats with a max rate of 156 bpm (avg 142 bpm). 5 Supraventricular Tachycardia  runs occurred, the run with the fastest interval lasting 8 beats with a max rate of 167 bpm, the longest lasting 18 beats with an avg rate of 139 bpm. Isolated SVEs were rare (<1.0%), SVE Couplets were rare (<1.0%), and SVE Triplets were rare (<1.0%).  Isolated VEs were occasional (1.1%, 3539), VE Couplets were rare (<1.0%, 12), and no VE Triplets were present. Ventricular Bigeminy and Trigeminy were present. Inverted QRS complexes possibly due to inverted placement of device.   Monitor shows a short 4 beat run of NSVT. Several short PSVT runs were noted. Infrequent PVC's and PAC's. No afib.   Carotid 10/28/21: Summary:  Right Carotid: The extracranial vessels were near-normal with only minimal  wall  thickening or plaque.   Left Carotid: The extracranial vessels were near-normal with only minimal  wall  thickening or plaque.   Vertebrals:  Bilateral vertebral arteries demonstrate antegrade flow.  Subclavians:  Normal flow hemodynamics were seen in bilateral subclavian  arteries.     EKG:  EKG is personally reviewed. 05/31/2022:  EKG was not ordered.  Recent Labs: 02/16/2022: Magnesium 3.2 04/25/2022: BUN 11; Creatinine, Ser 0.77; Hemoglobin 13.8; Platelets 242; Potassium 3.3; Sodium 141   Recent Lipid Panel    Component Value Date/Time   CHOL 153 01/20/2022 1446   TRIG 85  01/20/2022 1446   HDL 74 01/20/2022 1446   CHOLHDL 2.1 01/20/2022 1446   VLDL 17 01/20/2022 1446   LDLCALC 62 01/20/2022 1446   LDLCALC 146 (H) 07/03/2020 1334    Physical Exam:    VS:  BP 136/75 (BP Location: Left Arm, Patient Position: Sitting, Cuff Size: Normal)   Pulse 76   Ht 5' 4"$  (1.626 m)   Wt 154 lb 3.2 oz (69.9 kg)   SpO2 97%   BMI 26.47 kg/m  , BMI Body mass index is 26.47 kg/m. GENERAL:  Well appearing HEENT: Pupils equal round and reactive, fundi not visualized, oral mucosa unremarkable NECK:  No jugular venous distention, waveform within normal limits, carotid upstroke brisk and symmetric, no bruits, no thyromegaly LUNGS:  Clear to auscultation bilaterally HEART:  RRR.  PMI not displaced or sustained,S1 and S2 within normal limits, no S3, no S4, no clicks, no rubs, no murmurs ABD:  Flat, positive bowel sounds normal in frequency in pitch, no bruits, no rebound, no guarding, no midline pulsatile mass, no hepatomegaly, no splenomegaly EXT:  2 plus pulses throughout, no edema, no cyanosis, no clubbing SKIN:  No rashes, no nodules NEURO:  Cranial nerves II through XII grossly intact, motor grossly intact throughout PSYCH:  Cognitively intact, oriented to person place and time   ASSESSMENT/PLAN:    Hypertension Ms. Livingston's blood pressure is uncontrolled on multiple agents.  We will switch chlorthalidone to spironolactone.  Stop her potassium supplementation.  Will also increase her carvedilol to 25 mg twice daily.  She is struggling with a lot of stressors as well.  She expressed desire to see psychiatry and we will refer her for this as well.  Encouraged her to continue exercising and limit sodium in her diet.  She has already been referred for sleep study and will help make sure that she gets this appointment scheduled. Continue amlodipine and Imdur.  Coronary artery disease involving native coronary artery of native heart with angina pectoris [facility]) She has  obstructive CAD with an occluded RCA with L-->R collaterals.  Medical management.  She has no angina.  Continue aspirin, amlodipine, carvedilol, clopidogrel, Repatha and imdur.   Screening for Secondary Hypertension:     01/29/2022   12:23 PM  Causes  Renovascular HTN Screened     - Comments 01/2022 renal duplex ordered  Sleep Apnea Screened     - Comments 01/2022 in lab sleep study  Thyroid Disease Screened     - Comments 10/2021 hypothyroidism per PCP  Hyperaldosteronism Screened     - Comments 01/2022 renin-aldosterone ordered  Pheochromocytoma Screened     - Comments prior MRI abdomen normal kidney no tumor noted  Cushing's Syndrome Screened     - Comments 01/2022 cortisol  Coarctation of the Aorta Screened     - Comments 2023 carotid duplex no stenosis  Compliance Screened    Relevant Labs/Studies:    Latest Ref Rng & Units 04/25/2022    6:35 PM 01/22/2022   10:42 AM 01/20/2022    2:45 PM  Basic Labs  Sodium 135 - 145 mmol/L 141  140  142   Potassium 3.5 - 5.1 mmol/L 3.3  4.3  3.2   Creatinine 0.44 - 1.00 mg/dL 0.77  0.78  0.77        Latest Ref Rng & Units 07/03/2020    1:34 PM 05/20/2020    1:53 PM  Thyroid   TSH mIU/L 1.84  4.24        Latest Ref Rng & Units 02/16/2022    8:00 AM  Renin/Aldosterone   Aldosterone 0.0 - 30.0 ng/dL 7.1   Aldos/Renin Ratio 0.0 - 30.0 27.3        Latest Ref Rng & Units 02/16/2022    8:00 AM  Metanephrines/Catecholamines   Epinephrine 0 - 62 pg/mL 39   Norepinephrine 0 - 874 pg/mL 620   Dopamine 0 - 48 pg/mL <30   Metanephrines 0.0 - 88.0 pg/mL <25.0   Normetanephrines  0.0 - 244.0 pg/mL 53.2           02/25/2022    2:35 PM  Renovascular   Renal Artery [place] Completed Yes    Disposition:      FU with Famous Eisenhardt C. Oval Linsey, MD, [facility] in 1 month.  Medication Adjustments/Labs and Tests Ordered: Current medicines are reviewed at length with the patient today.  Concerns regarding medicines are outlined above.   Orders Placed  This Encounter  Procedures   Basic metabolic panel   Ambulatory referral to Psychiatry   Split night study   Meds ordered this encounter  Medications   spironolactone (ALDACTONE) 25 MG tablet    Sig: Take 1 tablet (25 mg total) by mouth daily.    Dispense:  90 tablet    Refill:  3    D/C POTASSIUM AND CHLORTHALIDONE   carvedilol (COREG) 25 MG tablet    Sig: Take 1 tablet (25 mg total) by mouth 2 (two) times daily.    Dispense:  60 tablet    Refill:  2    NEW DOSE, D/C 12.5 MG RX    I,Mathew Stumpf,acting as a scribe for Skeet Latch, MD.,have documented all relevant documentation on the behalf of Skeet Latch, MD,as directed by  Skeet Latch, MD while in the presence of Skeet Latch, MD.  I, Oriska Oval Linsey, MD have reviewed all documentation for this visit.  The documentation of the exam, diagnosis, procedures, and orders on 05/31/2022 are all accurate and complete.   Signed, Skeet Latch, MD  05/31/2022 5:52 PM    [place]

## 2022-05-31 NOTE — Assessment & Plan Note (Signed)
She has obstructive CAD with an occluded RCA with L-->R collaterals.  Medical management.  She has no angina.  Continue aspirin, amlodipine, carvedilol, clopidogrel, Repatha and imdur.

## 2022-05-31 NOTE — Assessment & Plan Note (Signed)
Bethany Wang blood pressure is uncontrolled on multiple agents.  We will switch chlorthalidone to spironolactone.  Stop her potassium supplementation.  Will also increase her carvedilol to 25 mg twice daily.  She is struggling with a lot of stressors as well.  She expressed desire to see psychiatry and we will refer her for this as well.  Encouraged her to continue exercising and limit sodium in her diet.  She has already been referred for sleep study and will help make sure that she gets this appointment scheduled. Continue amlodipine and Imdur.

## 2022-06-02 DIAGNOSIS — I25119 Atherosclerotic heart disease of native coronary artery with unspecified angina pectoris: Secondary | ICD-10-CM | POA: Diagnosis not present

## 2022-06-03 DIAGNOSIS — Z006 Encounter for examination for normal comparison and control in clinical research program: Secondary | ICD-10-CM

## 2022-06-03 NOTE — Research (Addendum)
I saw pt on 05/31/2022 after Dr. Blenda Mounts follow up visit. Pt is in Dr. Blenda Mounts Virtual Care HTN Study. Pt filled out research survey. Pt is enrolled in Group 2.

## 2022-06-08 ENCOUNTER — Ambulatory Visit (INDEPENDENT_AMBULATORY_CARE_PROVIDER_SITE_OTHER): Payer: 59 | Admitting: Neurology

## 2022-06-08 ENCOUNTER — Encounter: Payer: Self-pay | Admitting: Neurology

## 2022-06-08 VITALS — BP 190/104 | HR 69 | Ht 64.0 in | Wt 154.0 lb

## 2022-06-08 DIAGNOSIS — G43101 Migraine with aura, not intractable, with status migrainosus: Secondary | ICD-10-CM | POA: Diagnosis not present

## 2022-06-08 DIAGNOSIS — I25119 Atherosclerotic heart disease of native coronary artery with unspecified angina pectoris: Secondary | ICD-10-CM | POA: Diagnosis not present

## 2022-06-08 NOTE — Patient Instructions (Signed)
Please review information on Botox for chronic migraine headache  I would recommend cutting back on BC powder, Goody Powder, potential rebound headache, ideally not treating headache more than 2-3 days a week

## 2022-06-08 NOTE — Progress Notes (Unsigned)
Patient: Bethany Wang Date of Birth: [birth year]  Reason for Visit: Follow up History from: Patient Primary Neurologist: Penumalli  ASSESSMENT AND PLAN 57 y.o. year old female   Chronic migraines headaches/Tension headaches  History of MI with stents/TIA  -We discussed options, she is ultimately most concerned about potential interaction of any medication with her cardiac medications and cardiac issues  -Concern for CGRP medications with significant cardiac history, I have reached out to her cardiologist before starting, such as Ajovy or Emgality; also Ubrelvy as rescue -I have given her information about Botox therapy, I think this is the least likely to result in side effect given her medical history -Cut back on frequent OTC medications, likely causing rebound headache. -Having sleep study next month  -Follow up in 6 months, we will get back in touch with each other when I hear back from cardiology, she will think about Botox. -Previously tried and failed: Topamax, amitriptyline, Cymbalta, Effexor, Depakote, carvedilol, amlodipine, Nurtec, triptans contraindicated due to cardiac history  HISTORY  Today 06/08/22 SS: Tried Nurtec for 2 weeks, it made her nauseated. Reports every other day migraine. Takes BC powder and goody tablets. It doesn't help much. Has 3 grandkids that live with her, doesn't have option to lay down and sleep. Has significant heart disease, reports every 6 months has to have cardiac cath to ensure stents are open. Last sent was placed in October 2023. Having sleep study in March, she snores. BP up today, close follow up with cardiology. Migraines are right sided.   UPDATE (11/24/21, VRP): Since last visit, doing well in 2022 from HA, but now more headaches in 2023. Throbbing, sens to light, N/V (3-4 per week). Also had MI, stents in Dec 2022. Had TIA in 2019.    UPDATE (07/16/19, VRP): Since last visit, doing poorly; high stress at home since July 2020. More HA  (daily). 4 syncope events at home since July 2020; but did not go to ER. Poor PO intake (eats 1x per day; sometimes does not eat for 2-3 days). Also has poor sleep (stay up all night worrying and praying). No alleviating factors.     UPDATE 11/10/15: Since last visit, had another syncope event at church 2 weeks ago, while sitting down, no warning. Collapsed, hit head, LOC, no seizure activity noted. Taken to [facility] by 911, spent 1 night in ICU and then d/c home. Still eating 1 time per day (minimal food). Has poor appetite due to severe nausea with eating. Not seen GI in a long time. Still eating red dirt and ice.    UPDATE 08/08/15: Since last visit, no syncope events. Continues with headaches (3-4 per week). Also reports significant pica symptoms (ice and red dirt) and has history of iron deficiency anemia. Has tried iron tabs but could not tolerate.    UPDATE 06/25/15: Since last visit, continues to have episodes of syncope (4 since 2015). First feels sweaty, then LOC. Then wakes up with nausea, headache, pounding, left eye pain, ringing in ears, photophobia, then left arm numbness.   UPDATE 02/13/14: Since last visit, patient here for multiple syncope events. Has had 3 events in the last 2 months. Symptoms start with neck pain, then feeling sweaty, hot sensation, sounds feel like they are in an echo chamber, and then patient collapsed to the ground. No convulsions. Patient is unconscious for 5-10 minutes at a time. She then is able to wake up, with energy drained feeling, sweaty hot feeling. No convulsions. No tongue  biting or incontinence. Patient has had some double vision with some of these events.   PRIOR HPI (01/21/12): 57 year old right-handed female with history of hypertension, diabetes, here for evaluation of migraine. Patient reports history of intermittent severe headaches since childhood. Headaches typically start with nausea and vomiting, sensitivity sound, followed by severe frontal  headaches. She's been on Friday medications over the years. Nowadays she has to severe migraine days per month. She's tried Topamax but this caused a rash. She's been on amitriptyline since that time to help with migraine control. She also has significant stress in her life with anger issues, and is on Cymbalta, venlafaxine and Depakote. With some of these severe headaches, patient has had episodes where she passes out, falls to the ground and shakes. To the emergency room for evaluation the past. She's also seen by another neurologist, who did EEG, and diagnosed her with nonepileptic spells.    REVIEW OF SYSTEMS: Out of a complete 14 system review of symptoms, the patient complains only of the following symptoms, and all other reviewed systems are negative.  See HPI  ALLERGIES: Allergies  Allergen Reactions   Bee Venom Anaphylaxis and Other (See Comments)    "throat swelled and had to the hospital" - Yellow jacket sting   Lipitor [Atorvastatin] Rash and Other (See Comments)    Liver damage & Upper Abdominal pain, Break out like a rash . Per last hosp visit. It exposed on inpatient stay.   Penicillins Anaphylaxis and Other (See Comments)    Has patient had a PCN reaction causing immediate rash, facial/tongue/throat swelling, SOB or lightheadedness with hypotension: Yes Has patient had a PCN reaction causing severe rash involving mucus membranes or skin necrosis: Yes Has patient had a PCN reaction that required hospitalization Yes Has patient had a PCN reaction occurring within the last 10 years: Yes If all of the above answers are "NO", then may proceed with Cephalosporin use. Patient states she carries an epi-pen   Shellfish Allergy Swelling   Tomato Itching and Swelling   Valsartan Itching   Zofran [Ondansetron] Itching   Crestor [Rosuvastatin] Rash and Other (See Comments)    Upper Abdominal pain   Ranolazine Rash    HOME MEDICATIONS: Outpatient Medications Prior to Visit   Medication Sig Dispense Refill   albuterol (VENTOLIN HFA) 108 (90 Base) MCG/ACT inhaler Inhale 2 puffs into the lungs every 4 (four) hours as needed for wheezing or shortness of breath. 1 each 3   ALPRAZolam (XANAX) 0.5 MG tablet Take 1 tablet (0.5 mg total) by mouth 2 (two) times daily as needed for anxiety. 30 tablet 0   amLODipine (NORVASC) 10 MG tablet TAKE 1 TABLET ONCE DAILY. 90 tablet 3   aspirin EC 81 MG tablet Take 1 tablet (81 mg total) by mouth daily. 180 tablet 1   blood glucose meter kit and supplies KIT Use to check blood sugar daily. 1 each 0   carvedilol (COREG) 25 MG tablet Take 1 tablet (25 mg total) by mouth 2 (two) times daily. 60 tablet 2   Cholecalciferol (DIALYVITE VITAMIN D 5000) 125 MCG (5000 UT) capsule Take 5,000 Units by mouth daily.     clopidogrel (PLAVIX) 75 MG tablet Take 75 mg by mouth daily.     diphenhydrAMINE (BENADRYL) 25 MG tablet Take 25 mg by mouth every 6 (six) hours as needed for allergies.     doxycycline (VIBRA-TABS) 100 MG tablet Take 100 mg by mouth 2 (two) times daily.     escitalopram (  LEXAPRO) 20 MG tablet Take 1 tablet (20 mg total) by mouth daily. 30 tablet 3   Evolocumab (REPATHA SURECLICK [place]) Inject [id number] mg into the skin every 14 (fourteen) days.     fluorometholone (FML) 0.1 % ophthalmic suspension daily.     fluticasone-salmeterol (ADVAIR) 100-50 MCG/ACT AEPB INHALE 1 PUFF BY MOUTH TWICE DAILY 60 each 10   isosorbide mononitrate (IMDUR) 60 MG 24 hr tablet Take 1 tablet (60 mg total) by mouth 2 (two) times daily. 60 tablet 5   LINZESS 145 MCG CAPS capsule Take 1 capsule (145 mcg total) by mouth daily. 30 capsule 3   nitroGLYCERIN (NITROSTAT) 0.4 MG SL tablet DISSOLVE 1 TABLET UNDER THE TONGUE AS NEEDED FOR CHEST PAIN EVERY 5 MINUTES UP TO 3 TIMES. IF NO RELIEF CALL 911. 25 tablet 1   NP THYROID 60 MG tablet Take 60 mg by mouth every morning.     prednisoLONE acetate (PRED FORTE) 1 % ophthalmic suspension Place 1 drop into both eyes daily as  needed (dry eyes/eye infection symptoms).     RESTASIS 0.05 % ophthalmic emulsion daily.     spironolactone (ALDACTONE) 25 MG tablet Take 1 tablet (25 mg total) by mouth daily. 90 tablet 3   traZODone (DESYREL) 50 MG tablet Take 1-2 tablets by mouth at bedtime.     Facility-Administered Medications Prior to Visit  Medication Dose Route Frequency Provider Last Rate Last Admin   NONFORMULARY OR COMPOUNDED ITEM 0.2 mL  0.2 mL Intramuscular Q7 days [name], Nimish C, MD   0.2 mL at 10/23/19 1002    PAST MEDICAL HISTORY: Past Medical History:  Diagnosis Date   Anemia    Anxiety    Bilateral breast cysts 12/30/2015   Brain tumor ([place])    Breast cancer ([place])    Left Breast Cancer   Breast disorder    BV (bacterial vaginosis) 09/09/2015   Coronary atherosclerosis of native coronary artery    a. s/p DES to RCA in 2013 b. DES to LAD in 2014 c. cath in 04/2016 showing patent LAD stent with D2 jailed by LAD stent and CTO of RCA with left to right collaterals present   Cyst of pharynx or nasopharynx    [name]   Decreased libido 12/25/2018   Depression    Diabetes mellitus without complication ([place])    Essential hypertension    Falls    x 3-4 in past year   GERD (gastroesophageal reflux disease)    Headache    Heart attack ([place])    x4   History of hematuria    Mixed hyperlipidemia    Personal history of chemotherapy    Left Breast Cancer   Personal history of radiation therapy    Left Breast Cancer   Pre-diabetes    PUD (peptic ulcer disease)    Seizures ([place])    Shingles 09/09/2015   Stroke ([place])    12-2017 on Plavix, only deficit is headaches   Suicide attempt ([place])    3 attempts in remote past   Trichimoniasis 08/20/2019   Treated 08/20/19    Uterine cancer ([place])    Vitamin D deficiency disease 04/03/2019    PAST SURGICAL HISTORY: Past Surgical History:  Procedure Laterality Date   ABDOMINAL HYSTERECTOMY     BIOPSY  11/28/2019   Procedure: BIOPSY;   Surgeon: Rogene Houston, MD;  [facility];  Service: Endoscopy;;  antral, gastric body    BLADDER SURGERY     BREAST LUMPECTOMY  Left    BREAST LUMPECTOMY Right 02/24/2018   Procedure: RIGHT BREAST LUMPECTOMY ERAS PATHWAY;  Surgeon: Jovita Kussmaul, MD;  Location: [facility];  Service: General;  Laterality: Right;   CARDIAC CATHETERIZATION N/A 05/10/2016   Procedure: Left Heart Cath and Coronary Angiography;  Surgeon: Belva Crome, MD;  Location: [facility];  Service: Cardiovascular;  Laterality: N/A;   CAUTERIZE INNER NOSE  07/13/2020   COLONOSCOPY  May 2010   Fleishman: normal rectum, internal hemorrhoids, , benign colonic polyp   COLONOSCOPY N/A 01/05/2017   Procedure: COLONOSCOPY;  Surgeon: Rogene Houston, MD;  [facility];  Service: Endoscopy;  Laterality: N/A;  1:00   ESOPHAGEAL DILATION  11/28/2019   Procedure: ESOPHAGEAL DILATION;  Surgeon: Rogene Houston, MD;  [facility];  Service: Endoscopy;;   ESOPHAGOGASTRODUODENOSCOPY     2001 Dr. Amedeo Plenty: distal esophagitis, small hiatal hernia,. Dr. Tamala Julian 2006? no records available currently, pt also reports  EGD a few years ago with Dr. Gala Romney, do not have these reports anywhere in medical records   ESOPHAGOGASTRODUODENOSCOPY  06/02/2011   [id number] pill impaction as described above s/p dilation of a probable cervical esophageal web/bx abnormal esophageal and gastric mucosa. + H.pylori gastritis    ESOPHAGOGASTRODUODENOSCOPY (EGD) WITH PROPOFOL N/A 11/28/2019   Procedure: ESOPHAGOGASTRODUODENOSCOPY (EGD) WITH PROPOFOL;  Surgeon: Rogene Houston, MD;  [facility];  Service: Endoscopy;  Laterality: N/A;  1020   EYE SURGERY     Removed glass   HAND SURGERY Right    INTRAVASCULAR PRESSURE WIRE/FFR STUDY N/A 10/23/2020   Procedure: INTRAVASCULAR PRESSURE WIRE/FFR STUDY;  Surgeon: Belva Crome, MD;  Location: [facility];  Service: Cardiovascular;  Laterality: N/A;   INTRAVASCULAR PRESSURE  WIRE/FFR STUDY N/A 03/30/2021   Procedure: INTRAVASCULAR PRESSURE WIRE/FFR STUDY;  Surgeon: Nelva Bush, MD;  Location: [facility];  Service: Cardiovascular;  Laterality: N/A;   Left breast lumpectomy     Benign   LEFT HEART CATH AND CORONARY ANGIOGRAPHY N/A 10/25/2018   Procedure: LEFT HEART CATH AND CORONARY ANGIOGRAPHY;  Surgeon: Wellington Hampshire, MD;  Location: [facility];  Service: Cardiovascular;  Laterality: N/A;   LEFT HEART CATH AND CORONARY ANGIOGRAPHY N/A 10/23/2020   Procedure: LEFT HEART CATH AND CORONARY ANGIOGRAPHY;  Surgeon: Belva Crome, MD;  Location: [facility];  Service: Cardiovascular;  Laterality: N/A;   LEFT HEART CATH AND CORONARY ANGIOGRAPHY N/A 03/30/2021   Procedure: LEFT HEART CATH AND CORONARY ANGIOGRAPHY;  Surgeon: Nelva Bush, MD;  Location: [place] [facility];  Service: Cardiovascular;  Laterality: N/A;   LEFT HEART CATH AND CORONARY ANGIOGRAPHY N/A 01/22/2022   Procedure: LEFT HEART CATH AND CORONARY ANGIOGRAPHY;  Surgeon: Sherren Mocha, MD;  Location: [place] [facility];  Service: Cardiovascular;  Laterality: N/A;   LEFT HEART CATHETERIZATION WITH CORONARY ANGIOGRAM N/A 07/03/2014   Procedure: LEFT HEART CATHETERIZATION WITH CORONARY ANGIOGRAM;  Surgeon: Belva Crome, MD;  Location: [facility];  Service: Cardiovascular;  Laterality: N/A;   POLYPECTOMY  01/05/2017   Procedure: POLYPECTOMY;  Surgeon: Rogene Houston, MD;  [facility];  Service: Endoscopy;;  colon   SHOULDER SURGERY Left     FAMILY HISTORY: Family History  Problem Relation Age of Onset   Colon cancer Paternal Grandfather    Cancer Father    Cancer Maternal Uncle    Alzheimer's disease Paternal Aunt    Cirrhosis Maternal Uncle  Cancer Paternal Aunt        lung   Aneurysm Mother        brain   Heart disease Brother    Heart attack Brother    Mental illness Daughter    ADD / ADHD Daughter    Bipolar disorder Daughter    Mental illness  Son    ADD / ADHD Son    Bipolar disorder Son    Mental illness Son    ADD / ADHD Son    Bipolar disorder Son     SOCIAL HISTORY: Social History   Socioeconomic History   Marital status: Married    Spouse name: Saralyn Pilar   Number of children: 3   Years of education: Assoc   Highest education level: Not on file  Occupational History   Occupation: Retired    Comment: disability  Tobacco Use   Smoking status: Former    Packs/day: 0.20    Years: 32.00    Total pack years: 6.40    Types: Cigarettes, Cigars    Start date: 07/05/1983    Quit date: 04/03/2012    Years since quitting: 10.1   Smokeless tobacco: Never  Vaping Use   Vaping Use: Never used  Substance and Sexual Activity   Alcohol use: No    Alcohol/week: 0.0 standard drinks of alcohol   Drug use: No   Sexual activity: Yes    Birth control/protection: Surgical    Comment: hyst  Other Topics Concern   Not on file  Social History Narrative   Patient lives at new home with husband and extended family..On disability since age 47 yrs.   Social Determinants of Health   Financial Resource Strain: Medium Risk (01/13/2021)   Overall Financial Resource Strain (CARDIA)    Difficulty of Paying Living Expenses: Somewhat hard  Food Insecurity: No Food Insecurity (01/13/2021)   Hunger Vital Sign    Worried About Running Out of Food in the Last Year: Never true    Ran Out of Food in the Last Year: Never true  Transportation Needs: No Transportation Needs (01/13/2021)   PRAPARE - Hydrologist (Medical): No    Lack of Transportation (Non-Medical): No  Physical Activity: Sufficiently Active (01/13/2021)   Exercise Vital Sign    Days of Exercise per Week: 7 days    Minutes of Exercise per Session: 60 min  Stress: Stress Concern Present (01/13/2021)   [place]    Feeling of Stress : To some extent  Social Connections: Moderately  Isolated (01/13/2021)   Social Connection and Isolation Panel [NHANES]    Frequency of Communication with Friends and Family: Never    Frequency of Social Gatherings with Friends and Family: Never    Attends Religious Services: More than 4 times per year    Active Member of [employer] or Organizations: No    Attends Archivist Meetings: Never    Marital Status: Married  Human resources officer Violence: Not At Risk (01/13/2021)   Humiliation, Afraid, Rape, and Kick questionnaire    Fear of Current or Ex-Partner: No    Emotionally Abused: No    Physically Abused: No    Sexually Abused: No    PHYSICAL EXAM  There were no vitals filed for this visit. There is no height or weight on file to calculate BMI.  Generalized: Well developed, in no acute distress  Neurological examination  Mentation: Alert oriented to time, place, history  taking. Follows all commands speech and language fluent Cranial nerve II-XII: Pupils were equal round reactive to light. Extraocular movements were full, visual field were full on confrontational test. Facial sensation and strength were normal. Uvula tongue midline. Head turning and shoulder shrug  were normal and symmetric. Motor: The motor testing reveals 5 over 5 strength of all 4 extremities. Good symmetric motor tone is noted throughout.  Sensory: Sensory testing is intact to soft touch on all 4 extremities. No evidence of extinction is noted.  Coordination: Cerebellar testing reveals good finger-nose-finger and heel-to-shin bilaterally.  Gait and station: Gait is normal. Tandem gait is normal. Romberg is negative. No drift is seen.  Reflexes: Deep tendon reflexes are symmetric and normal bilaterally.   DIAGNOSTIC DATA (LABS, IMAGING, TESTING) - I reviewed patient records, labs, notes, testing and imaging myself where available.  Lab Results  Component Value Date   WBC 6.6 04/25/2022   HGB 13.8 04/25/2022   HCT 43.5 04/25/2022   MCV 88.2 04/25/2022    PLT 242 04/25/2022      Component Value Date/Time   NA 141 04/25/2022 1835   NA 143 03/26/2021 1135   K 3.3 (L) 04/25/2022 1835   CL 107 04/25/2022 1835   CO2 27 04/25/2022 1835   GLUCOSE 100 (H) 04/25/2022 1835   BUN 11 04/25/2022 1835   BUN 9 03/26/2021 1135   CREATININE 0.77 04/25/2022 1835   CREATININE 1.01 07/03/2020 1334   CALCIUM 9.5 04/25/2022 1835   PROT 6.8 10/22/2020 1945   PROT 7.0 11/10/2015 0914   ALBUMIN 3.6 10/22/2020 1945   ALBUMIN 4.4 11/10/2015 0914   AST 21 10/22/2020 1945   ALT 14 10/22/2020 1945   ALKPHOS 112 10/22/2020 1945   BILITOT 0.8 10/22/2020 1945   BILITOT 0.2 11/10/2015 0914   GFRNONAA >60 04/25/2022 1835   GFRNONAA 63 07/03/2020 1334   GFRAA 73 07/03/2020 1334   Lab Results  Component Value Date   CHOL 153 01/20/2022   HDL 74 01/20/2022   LDLCALC 62 01/20/2022   TRIG 85 01/20/2022   CHOLHDL 2.1 01/20/2022   Lab Results  Component Value Date   HGBA1C 5.1 10/29/2019   Lab Results  Component Value Date   VITAMINB12 680 11/10/2015   Lab Results  Component Value Date   TSH 1.84 07/03/2020    Butler Denmark, AGNP-C, DNP 06/08/2022, 5:14 AM [facility] [address] [place], [place] [address] [phone]

## 2022-06-10 ENCOUNTER — Encounter: Payer: Self-pay | Admitting: Radiology

## 2022-06-10 DIAGNOSIS — E1169 Type 2 diabetes mellitus with other specified complication: Secondary | ICD-10-CM | POA: Diagnosis not present

## 2022-06-15 LAB — BASIC METABOLIC PANEL

## 2022-06-17 DIAGNOSIS — M6281 Muscle weakness (generalized): Secondary | ICD-10-CM | POA: Diagnosis not present

## 2022-06-17 DIAGNOSIS — H8110 Benign paroxysmal vertigo, unspecified ear: Secondary | ICD-10-CM | POA: Diagnosis not present

## 2022-06-17 DIAGNOSIS — R262 Difficulty in walking, not elsewhere classified: Secondary | ICD-10-CM | POA: Diagnosis not present

## 2022-06-21 DIAGNOSIS — E119 Type 2 diabetes mellitus without complications: Secondary | ICD-10-CM | POA: Diagnosis not present

## 2022-06-22 ENCOUNTER — Telehealth (HOSPITAL_BASED_OUTPATIENT_CLINIC_OR_DEPARTMENT_OTHER): Payer: Self-pay | Admitting: *Deleted

## 2022-06-22 DIAGNOSIS — I10 Essential (primary) hypertension: Secondary | ICD-10-CM

## 2022-06-22 DIAGNOSIS — Z5181 Encounter for therapeutic drug level monitoring: Secondary | ICD-10-CM

## 2022-06-22 MED ORDER — SPIRONOLACTONE 50 MG PO TABS: 50.0000 mg | ORAL_TABLET | Freq: Every day | ORAL | 3 refills | Status: DC

## 2022-06-22 NOTE — Telephone Encounter (Signed)
-----   Message from Skeet Latch, MD sent at 06/22/2022  9:13 AM EDT ----- Normal kidney function.  Potassium remains low.  BP still slightly above goal in [place].  Increase spironolactone to 50mg .  Repeat BMP in one week.

## 2022-06-22 NOTE — Telephone Encounter (Signed)
Released in mychart with below Dr Blenda Mounts comments and below  Left a message for you to call back. If you have no questions no need to call. Have placed order for labs and sent over new Rx for Spironolactone 50 mg daily with a note to pharmacy you will call when need filled.

## 2022-06-23 DIAGNOSIS — M6281 Muscle weakness (generalized): Secondary | ICD-10-CM | POA: Diagnosis not present

## 2022-06-23 DIAGNOSIS — H8110 Benign paroxysmal vertigo, unspecified ear: Secondary | ICD-10-CM | POA: Diagnosis not present

## 2022-06-23 DIAGNOSIS — R262 Difficulty in walking, not elsewhere classified: Secondary | ICD-10-CM | POA: Diagnosis not present

## 2022-06-24 DIAGNOSIS — H8110 Benign paroxysmal vertigo, unspecified ear: Secondary | ICD-10-CM | POA: Diagnosis not present

## 2022-06-24 DIAGNOSIS — Z6824 Body mass index (BMI) 24.0-24.9, adult: Secondary | ICD-10-CM | POA: Diagnosis not present

## 2022-06-24 DIAGNOSIS — G4700 Insomnia, unspecified: Secondary | ICD-10-CM | POA: Diagnosis not present

## 2022-06-29 DIAGNOSIS — M6281 Muscle weakness (generalized): Secondary | ICD-10-CM | POA: Diagnosis not present

## 2022-06-29 DIAGNOSIS — R262 Difficulty in walking, not elsewhere classified: Secondary | ICD-10-CM | POA: Diagnosis not present

## 2022-06-29 DIAGNOSIS — H8110 Benign paroxysmal vertigo, unspecified ear: Secondary | ICD-10-CM | POA: Diagnosis not present

## 2022-06-30 ENCOUNTER — Telehealth (HOSPITAL_BASED_OUTPATIENT_CLINIC_OR_DEPARTMENT_OTHER): Payer: Self-pay

## 2022-06-30 DIAGNOSIS — Z0000 Encounter for general adult medical examination without abnormal findings: Secondary | ICD-10-CM

## 2022-06-30 NOTE — Telephone Encounter (Signed)
Called the patient to inform her to bring her Vivify cuff to her appointment to return to Laurann Montana, NP. Left patient a message regarding this matter.   Avelino Leeds, MS, ERHD, [facility]  Care Guide, Health & Wellness Coach [address] [place] [address] Telephone: [phone] Email: [email or web address]

## 2022-07-01 ENCOUNTER — Encounter (HOSPITAL_BASED_OUTPATIENT_CLINIC_OR_DEPARTMENT_OTHER): Payer: Self-pay | Admitting: Family

## 2022-07-01 ENCOUNTER — Ambulatory Visit (INDEPENDENT_AMBULATORY_CARE_PROVIDER_SITE_OTHER): Payer: 59 | Admitting: Family

## 2022-07-01 VITALS — BP 180/92 | HR 77 | Ht 64.0 in | Wt 158.0 lb

## 2022-07-01 DIAGNOSIS — I25118 Atherosclerotic heart disease of native coronary artery with other forms of angina pectoris: Secondary | ICD-10-CM | POA: Diagnosis not present

## 2022-07-01 DIAGNOSIS — I701 Atherosclerosis of renal artery: Secondary | ICD-10-CM

## 2022-07-01 DIAGNOSIS — Z006 Encounter for examination for normal comparison and control in clinical research program: Secondary | ICD-10-CM

## 2022-07-01 DIAGNOSIS — E785 Hyperlipidemia, unspecified: Secondary | ICD-10-CM | POA: Diagnosis not present

## 2022-07-01 DIAGNOSIS — I10 Essential (primary) hypertension: Secondary | ICD-10-CM | POA: Diagnosis not present

## 2022-07-01 MED ORDER — DOXAZOSIN MESYLATE 2 MG PO TABS: 2.0000 mg | ORAL_TABLET | Freq: Every day | ORAL | 2 refills | Status: DC

## 2022-07-01 NOTE — Research (Signed)
I saw pt today after Caitlin Walker NP follow up visit. Pt is in Dr. [place]'s Virtual Care HTN Study. Pt filled out research survey. Pt is enrolled in Group 2.  

## 2022-07-01 NOTE — Patient Instructions (Addendum)
Medication Instructions:  Your physician has recommended you make the following change in your medication:    START Doxazosin 2mg  in the evening  STOP Spironolactone    Testing/Procedures: Your sleep study is scheduled for 07/05/22  Your physician has requested that you have a renal artery duplex November 2024. During this test, an ultrasound is used to evaluate blood flow to the kidneys. Allow one hour for this exam. Do not eat after midnight the day before and avoid carbonated beverages. Take your medications as you usually do.    Follow-Up: Follow up with Dr. Oval Linsey on 4/30 at 9:15am    Referrals:  We will reach out to the psychology team about your referral.    Special Instructions:   Continue to check blood pressure twice per day.  [facility] center allows you to make a counseling appointment wihtout a referral.  Website: [email or web address]  Address: [address], [place], [place] [address] [place]: [phone]

## 2022-07-01 NOTE — Progress Notes (Signed)
Advanced Hypertension Clinic Initial Assessment:    Date:  07/01/2022   ID:  Bethany Wang, DOB November 06, 1965, MRN [id number]  PCP:  Chetek Nation, MD  Cardiologist:  Pixie Casino, MD  Nephrologist:  Referring MD: [place] Nation, MD   CC: Hypertension  History of Present Illness:    Bethany Wang is a 57 y.o. female with a hx of CAD (s/p DES to RCA 2013, DES to LAD 2014, cath 04/2016 and 10/2020 and 01/2022 patent LAD stent with jailed D2 by LAD stent and CTO RCA to L-R collaterals), HTN, DM2 last seen 05/31/22  Hospitalized July 2020 with chest pain with LHC with occlusion of RCA with previously placed coronary stent and left-to-right collaterals.  50% ISR of proximal LAD and 90% diagonal ostial narrowing.  Also 60 to 70% obtuse marginal narrowing of 50% distal circumflex disease.  Recommended for medical therapy.  Intolerant to Atorvastatin, Rosuvastatin and Repatha initiated. Repeat catheterization 10/2020, 03/2021 stable compared top previous with recommendation for medical management. Imdur adjusted to 60mg  BID. Trialed on Ranolazine but developed rash and discontinued.  Seen 10/15/21 thyroid medications, Amlodipine, HCTZ on hold by PCP but unclear why.  Her BP in clinic was 203/103 ?  190/90 but she declined medication changes as she anticipated it was high due to argument prior to office visit.  HCTZ and Amlodipine subsequently resumed. Monitor due to palpitations with predominantly normal sinus rhythm.  For short beat run of NSVT.  Several short PSVT noted.  Infrequent PVC/PAC.  No atrial fibrillation.  Carotid duplex 10/2021 showed no significant stenosis.  Saw Dr. Debara Pickett 01/20/22 for worsening angina with [facility] 01/22/22 revealing stable multivessel coronary disease with CTO of proximal RCA collateralized from LCA, mild to moderate ISR of the LAD with previous negative flow wire, moderately severe OM stenosis and small OM branch with negative flow wire in the past.  Recommended  for continued medical therapy.  Also suggested escalation of antihypertensive therapy as suspected supply/demand mismatch driving symptoms. Recommended for more advanced hypertension clinic. Cortisol, catecholamines, metanephrines, renin-aldosterone normal. Renal dopplers 02/2022 mild to moderate right renal artery stenosis and no stenosis. On left. Sleep study completed 01/2022.   ED visit 04/25/22 with chest tightness. Hypertensive with BY 185/88 and troponin negative x2 and workup unremarkable. She was seen 05/31/22 with BP in Vivify 130s-140s/80s and Chlorthalidone transitioned to Spironolactone. She reported continued stressors as well as previously diagnosis of PTSD and was referred to psychology.   Presents today for follow up independently. Tells me she started Spironolactone but felt severely nauseous and was dehydrated. She subsequently self discontinued the medication. Reports right abdominal pain with no clear pattern (not after eating). Discussed her mild to moderate renal stenosis would not contribute to this. No changes in bowel habits - takes her Linzess regularly. She does have a colonoscopy coming up. BP at home 131/66 over the last 13 readings. Notes she is using her inhaler more due to change of season with improvement in her dyspnea.   Previous antihypertensives: Valsartan - itching Metoprolol - transition to Carvedilol Spironolactone - nausea, dehydration  Past Medical History:  Diagnosis Date   Anemia    Anxiety    Bilateral breast cysts 12/30/2015   Brain tumor ([place])    Breast cancer ([place])    Left Breast Cancer   Breast disorder    BV (bacterial vaginosis) 09/09/2015   Coronary atherosclerosis of native coronary artery    a. s/p DES to RCA in 2013 b.  DES to LAD in 2014 c. cath in 04/2016 showing patent LAD stent with D2 jailed by LAD stent and CTO of RCA with left to right collaterals present   Cyst of pharynx or nasopharynx    [name]   Decreased  libido 12/25/2018   Depression    Diabetes mellitus without complication ([place])    Essential hypertension    Falls    x 3-4 in past year   GERD (gastroesophageal reflux disease)    Headache    Heart attack ([place])    x4   History of hematuria    Mixed hyperlipidemia    Personal history of chemotherapy    Left Breast Cancer   Personal history of radiation therapy    Left Breast Cancer   Pre-diabetes    PUD (peptic ulcer disease)    Seizures ([place])    Shingles 09/09/2015   Stroke ([place])    12-2017 on Plavix, only deficit is headaches   Suicide attempt ([place])    3 attempts in remote past   Trichimoniasis 08/20/2019   Treated 08/20/19    Uterine cancer ([place])    Vitamin D deficiency disease 04/03/2019    Past Surgical History:  Procedure Laterality Date   ABDOMINAL HYSTERECTOMY     BIOPSY  11/28/2019   Procedure: BIOPSY;  Surgeon: Rogene Houston, MD;  [facility];  Service: Endoscopy;;  antral, gastric body    BLADDER SURGERY     BREAST LUMPECTOMY Left    BREAST LUMPECTOMY Right 02/24/2018   Procedure: RIGHT BREAST LUMPECTOMY ERAS PATHWAY;  Surgeon: Jovita Kussmaul, MD;  Location: [facility] OR;  Service: General;  Laterality: Right;   CARDIAC CATHETERIZATION N/A 05/10/2016   Procedure: Left Heart Cath and Coronary Angiography;  Surgeon: Belva Crome, MD;  Location: [place] [facility] LAB;  Service: Cardiovascular;  Laterality: N/A;   CAUTERIZE INNER NOSE  07/13/2020   COLONOSCOPY  May 2010   Fleishman: normal rectum, internal hemorrhoids, , benign colonic polyp   COLONOSCOPY N/A 01/05/2017   Procedure: COLONOSCOPY;  Surgeon: Rogene Houston, MD;  [facility];  Service: Endoscopy;  Laterality: N/A;  1:00   ESOPHAGEAL DILATION  11/28/2019   Procedure: ESOPHAGEAL DILATION;  Surgeon: Rogene Houston, MD;  [facility];  Service: Endoscopy;;   ESOPHAGOGASTRODUODENOSCOPY     2001 Dr. Amedeo Plenty: distal esophagitis, small hiatal hernia,. Dr. Tamala Julian 2006? no records  available currently, pt also reports  EGD a few years ago with Dr. Gala Romney, do not have these reports anywhere in medical records   ESOPHAGOGASTRODUODENOSCOPY  06/02/2011   [id number] pill impaction as described above s/p dilation of a probable cervical esophageal web/bx abnormal esophageal and gastric mucosa. + H.pylori gastritis    ESOPHAGOGASTRODUODENOSCOPY (EGD) WITH PROPOFOL N/A 11/28/2019   Procedure: ESOPHAGOGASTRODUODENOSCOPY (EGD) WITH PROPOFOL;  Surgeon: Rogene Houston, MD;  [facility];  Service: Endoscopy;  Laterality: N/A;  1020   EYE SURGERY     Removed glass   HAND SURGERY Right    INTRAVASCULAR PRESSURE WIRE/FFR STUDY N/A 10/23/2020   Procedure: INTRAVASCULAR PRESSURE WIRE/FFR STUDY;  Surgeon: Belva Crome, MD;  Location: [place] [facility] LAB;  Service: Cardiovascular;  Laterality: N/A;   INTRAVASCULAR PRESSURE WIRE/FFR STUDY N/A 03/30/2021   Procedure: INTRAVASCULAR PRESSURE WIRE/FFR STUDY;  Surgeon: Nelva Bush, MD;  Location: [place] CV LAB;  Service: Cardiovascular;  Laterality: N/A;   Left breast lumpectomy     Benign   LEFT HEART  CATH AND CORONARY ANGIOGRAPHY N/A 10/25/2018   Procedure: LEFT HEART CATH AND CORONARY ANGIOGRAPHY;  Surgeon: Wellington Hampshire, MD;  Location: [place] [facility] LAB;  Service: Cardiovascular;  Laterality: N/A;   LEFT HEART CATH AND CORONARY ANGIOGRAPHY N/A 10/23/2020   Procedure: LEFT HEART CATH AND CORONARY ANGIOGRAPHY;  Surgeon: Belva Crome, MD;  Location: [place] CV LAB;  Service: Cardiovascular;  Laterality: N/A;   LEFT HEART CATH AND CORONARY ANGIOGRAPHY N/A 03/30/2021   Procedure: LEFT HEART CATH AND CORONARY ANGIOGRAPHY;  Surgeon: Nelva Bush, MD;  Location: [place] CV LAB;  Service: Cardiovascular;  Laterality: N/A;   LEFT HEART CATH AND CORONARY ANGIOGRAPHY N/A 01/22/2022   Procedure: LEFT HEART CATH AND CORONARY ANGIOGRAPHY;  Surgeon: Sherren Mocha, MD;  Location: [place] [facility] LAB;  Service:  Cardiovascular;  Laterality: N/A;   LEFT HEART CATHETERIZATION WITH CORONARY ANGIOGRAM N/A 07/03/2014   Procedure: LEFT HEART CATHETERIZATION WITH CORONARY ANGIOGRAM;  Surgeon: Belva Crome, MD;  Location: [facility] CATH LAB;  Service: Cardiovascular;  Laterality: N/A;   POLYPECTOMY  01/05/2017   Procedure: POLYPECTOMY;  Surgeon: Rogene Houston, MD;  [facility];  Service: Endoscopy;;  colon   SHOULDER SURGERY Left     Current Medications: Current Meds  Medication Sig   albuterol (VENTOLIN HFA) 108 (90 Base) MCG/ACT inhaler Inhale 2 puffs into the lungs every 4 (four) hours as needed for wheezing or shortness of breath.   ALPRAZolam (XANAX) 0.5 MG tablet Take 1 tablet (0.5 mg total) by mouth 2 (two) times daily as needed for anxiety.   amLODipine (NORVASC) 10 MG tablet TAKE 1 TABLET ONCE DAILY.   aspirin EC 81 MG tablet Take 1 tablet (81 mg total) by mouth daily.   blood glucose meter kit and supplies KIT Use to check blood sugar daily.   carvedilol (COREG) 25 MG tablet Take 1 tablet (25 mg total) by mouth 2 (two) times daily.   Cholecalciferol (DIALYVITE VITAMIN D 5000) 125 MCG (5000 UT) capsule Take 5,000 Units by mouth daily.   clopidogrel (PLAVIX) 75 MG tablet Take 75 mg by mouth daily.   diphenhydrAMINE (BENADRYL) 25 MG tablet Take 25 mg by mouth every 6 (six) hours as needed for allergies.   doxycycline (VIBRA-TABS) 100 MG tablet Take 100 mg by mouth 2 (two) times daily.   escitalopram (LEXAPRO) 20 MG tablet Take 1 tablet (20 mg total) by mouth daily.   Evolocumab (REPATHA SURECLICK [place]) Inject [id number] mg into the skin every 14 (fourteen) days.   fluorometholone (FML) 0.1 % ophthalmic suspension daily.   fluticasone-salmeterol (ADVAIR) 100-50 MCG/ACT AEPB INHALE 1 PUFF BY MOUTH TWICE DAILY   isosorbide mononitrate (IMDUR) 60 MG 24 hr tablet Take 1 tablet (60 mg total) by mouth 2 (two) times daily.   LINZESS 145 MCG CAPS capsule Take 1 capsule (145 mcg total) by mouth daily.    nitroGLYCERIN (NITROSTAT) 0.4 MG SL tablet DISSOLVE 1 TABLET UNDER THE TONGUE AS NEEDED FOR CHEST PAIN EVERY 5 MINUTES UP TO 3 TIMES. IF NO RELIEF CALL 911.   NP THYROID 60 MG tablet Take 60 mg by mouth every morning.   RESTASIS 0.05 % ophthalmic emulsion daily.   traZODone (DESYREL) 50 MG tablet Take 1-2 tablets by mouth at bedtime.   Current Facility-Administered Medications for the 07/01/22 encounter (Office Visit) with Loel Dubonnet, NP  Medication   NONFORMULARY OR COMPOUNDED ITEM 0.2 mL     Allergies:   Bee venom, Lipitor [atorvastatin], Penicillins, Shellfish  allergy, Tomato, Valsartan, Zofran [ondansetron], Crestor [rosuvastatin], and Ranolazine   Social History   Socioeconomic History   Marital status: Married    Spouse name: Saralyn Pilar   Number of children: 3   Years of education: Assoc   Highest education level: Not on file  Occupational History   Occupation: Retired    Comment: disability  Tobacco Use   Smoking status: Former    Packs/day: 0.20    Years: 32.00    Additional pack years: 0.00    Total pack years: 6.40    Types: Cigarettes, Cigars    Start date: 07/05/1983    Quit date: 04/03/2012    Years since quitting: 10.2   Smokeless tobacco: Never  Vaping Use   Vaping Use: Never used  Substance and Sexual Activity   Alcohol use: No    Alcohol/week: 0.0 standard drinks of alcohol   Drug use: No   Sexual activity: Yes    Birth control/protection: Surgical    Comment: hyst  Other Topics Concern   Not on file  Social History Narrative   Patient lives at new home with husband and extended family..On disability since age 35 yrs.   Social Determinants of Health   Financial Resource Strain: Medium Risk (01/13/2021)   Overall Financial Resource Strain (CARDIA)    Difficulty of Paying Living Expenses: Somewhat hard  Food Insecurity: No Food Insecurity (01/13/2021)   Hunger Vital Sign    Worried About Running Out of Food in the Last Year: Never true    Ran Out  of Food in the Last Year: Never true  Transportation Needs: No Transportation Needs (01/13/2021)   PRAPARE - Hydrologist (Medical): No    Lack of Transportation (Non-Medical): No  Physical Activity: Sufficiently Active (01/13/2021)   Exercise Vital Sign    Days of Exercise per Week: 7 days    Minutes of Exercise per Session: 60 min  Stress: Stress Concern Present (01/13/2021)   [place]    Feeling of Stress : To some extent  Social Connections: Moderately Isolated (01/13/2021)   Social Connection and Isolation Panel [NHANES]    Frequency of Communication with Friends and Family: Never    Frequency of Social Gatherings with Friends and Family: Never    Attends Religious Services: More than 4 times per year    Active Member of [employer] or Organizations: No    Attends Archivist Meetings: Never    Marital Status: Married     Family History: The patient's family history includes ADD / ADHD in her daughter, son, and son; Alzheimer's disease in her paternal aunt; Aneurysm in her mother; Bipolar disorder in her daughter, son, and son; Cancer in her father, maternal uncle, and paternal aunt; Cirrhosis in her maternal uncle; Colon cancer in her paternal grandfather; Heart attack in her brother; Heart disease in her brother; Mental illness in her daughter, son, and son.  ROS:   Please see the history of present illness.     All other systems reviewed and are negative.  EKGs/Labs/Other Studies Reviewed:    EKG:  No EKG today.  LHC 01/22/22   Conclusion  Final conclusion: Stable multivessel coronary artery disease with chronic total occlusion of the proximal RCA collateralized from the left coronary artery, mild to moderate in-stent restenosis in the LAD (previous flow wire negative), moderately severe OM stenosis and a small OM branch (negative flow wire in past)   No  change in the  patient's coronary anatomy over multiple studies.  FloWire analysis has been done demonstrating no flow obstructive disease on past studies.  Recommend continued medical therapy.  Likely needs escalation of antihypertensive therapy as I suspect hypertension (supply/demand mismatch) is driving her symptoms.   Diagnostic Dominance: Co-dominant     Monitor 11/03/2021 Patch Wear Time:  3 days and 9 hours (2023-07-14T13:09:22-0400 to 2023-07-17T22:28:44-0400)   Patient had a min HR of 45 bpm, max HR of 167 bpm, and avg HR of 69 bpm. Predominant underlying rhythm was Sinus Rhythm. 1 run of Ventricular Tachycardia occurred lasting 4 beats with a max rate of 156 bpm (avg 142 bpm). 5 Supraventricular Tachycardia  runs occurred, the run with the fastest interval lasting 8 beats with a max rate of 167 bpm, the longest lasting 18 beats with an avg rate of 139 bpm. Isolated SVEs were rare (<1.0%), SVE Couplets were rare (<1.0%), and SVE Triplets were rare (<1.0%).  Isolated VEs were occasional (1.1%, 3539), VE Couplets were rare (<1.0%, 12), and no VE Triplets were present. Ventricular Bigeminy and Trigeminy were present. Inverted QRS complexes possibly due to inverted placement of device.   Monitor shows a short 4 beat run of NSVT. Several short PSVT runs were noted. Infrequent PVC's and PAC's. No afib.  Carotid 10/28/21 Summary:  Right Carotid: The extracranial vessels were near-normal with only minimal  wall  thickening or plaque.   Left Carotid: The extracranial vessels were near-normal with only minimal  wall  thickening or plaque.   Vertebrals:  Bilateral vertebral arteries demonstrate antegrade flow.  Subclavians: Normal flow hemodynamics were seen in bilateral subclavian  arteries.   Recent Labs: 02/16/2022: Magnesium 3.2 04/25/2022: Hemoglobin 13.8; Platelets 242 06/14/2022: BUN CANCELED; Creatinine, Ser CANCELED; Potassium CANCELED; Sodium CANCELED   Recent Lipid Panel    Component Value  Date/Time   CHOL 153 01/20/2022 1446   TRIG 85 01/20/2022 1446   HDL 74 01/20/2022 1446   CHOLHDL 2.1 01/20/2022 1446   VLDL 17 01/20/2022 1446   LDLCALC 62 01/20/2022 1446   LDLCALC 146 (H) 07/03/2020 1334    Physical Exam:   VS:  BP (!) 217/101   Pulse 77   Ht 5\' 4"  (1.626 m)   Wt 158 lb (71.7 kg)   BMI 27.12 kg/m  , BMI Body mass index is 27.12 kg/m. GENERAL:  Well appearing HEENT: Pupils equal round and reactive, fundi not visualized, oral mucosa unremarkable NECK:  No jugular venous distention, waveform within normal limits, carotid upstroke brisk and symmetric, no bruits, no thyromegaly LYMPHATICS:  No cervical adenopathy LUNGS:  Clear to auscultation bilaterally HEART:  RRR.  PMI not displaced or sustained,S1 and S2 within normal limits, no S3, no S4, no clicks, no rubs, no murmurs ABD:  Flat, positive bowel sounds normal in frequency in pitch, no bruits, no rebound, no guarding, no midline pulsatile mass, no hepatomegaly, no splenomegaly EXT:  2 plus pulses throughout, no edema, no cyanosis no clubbing SKIN:  No rashes no nodules.  NEURO:  Cranial nerves II through XII grossly intact, motor grossly intact throughout PSYCH:  Cognitively intact, oriented to person place and time   ASSESSMENT/PLAN:    HTN - BP not at goal <130/80. Catecholamines, metanephrines, renin aldosterone ratio, cortisol unremarkable. Reported intolerance to Spironolactone with nausea, dehydration. WE discussed returning to Chlorthalidone vs addition of Doxazosin. Prefers to trial new agent. Rx Doxazosin 2mg  QHS.  10/2021 MR abdomen "adrenal glands appear normal. No suspicious renal mass or  hydronephrosis identified bilaterally. Tiny cortical cyst in lower pole right kidney"  Snores / Sleep disordered breathing - STOP Bang 5. Notes snoring and daytime somnolence. Never on CPAP. Last sleep study >53 years old. In lab sleep study upcoming 07/05/22.   Hypothyroidism - Continue to follow with PCP.   CAD  - Prior DES RCA 2013, DES LAD 2014. Most recent cath 01/2022 stable multi vessel CAD with CTO prox RCA collateralized from LAD, mild to moderate ISR of LAD (previous flow wire negative), moderately severe OM and small OM branch (prior negative flow wire). Hypertension (supply/demand mismatch) thought to be driving her symptoms - management below.   GDMT Aspirin, Plavix, Coreg, Imdur, Repatha. Heart healthy diet and regular cardiovascular exercise encouraged.    Palpitations - Monitor 11/03/2021 predominantly normal sinus rhythm, 1 run of VT lasting 4 beats, 5 runs of SVT.  Infrequent PVCs/PAC.  Continue Carvedilol.   Stress - Notes multiple stressors related to her husband. Discussed role stress could play in blood pressure.Previously referred to psychology. Additional resources for counseling provided on AVS.   HLD, LDL goal <70 - Statin intolerant. 01/20/22 LDL 62. On Repatha.   Hx of CVA - Continue Repatha, aspirin. Continues to follow with PT.  Screening for Secondary Hypertension:     01/29/2022   12:23 PM  Causes  Renovascular HTN Screened     - Comments 01/2022 renal duplex ordered  Sleep Apnea Screened     - Comments 01/2022 in lab sleep study  Thyroid Disease Screened     - Comments 10/2021 hypothyroidism per PCP  Hyperaldosteronism Screened     - Comments 01/2022 renin-aldosterone ordered  Pheochromocytoma Screened     - Comments prior MRI abdomen normal kidney no tumor noted  Cushing's Syndrome Screened     - Comments 01/2022 cortisol  Coarctation of the Aorta Screened     - Comments 2023 carotid duplex no stenosis  Compliance Screened    Relevant Labs/Studies:    06/14/2022   10:00 AM 04/25/2022    6:35 PM 01/22/2022   10:42 AM  Basic Labs  Sodium CANCELED  141  140   Potassium CANCELED  3.3  4.3   Creatinine CANCELED  0.77  0.78        Latest Ref Rng & Units 07/03/2020    1:34 PM 05/20/2020    1:53 PM  Thyroid   TSH mIU/L 1.84  4.24        Latest Ref Rng & Units  02/16/2022    8:00 AM  Renin/Aldosterone   Aldosterone 0.0 - 30.0 ng/dL 7.1   Aldos/Renin Ratio 0.0 - 30.0 27.3        Latest Ref Rng & Units 02/16/2022    8:00 AM  Metanephrines/Catecholamines   Epinephrine 0 - 62 pg/mL 39   Norepinephrine 0 - 874 pg/mL 620   Dopamine 0 - 48 pg/mL <30   Metanephrines 0.0 - 88.0 pg/mL <25.0   Normetanephrines  0.0 - 244.0 pg/mL 53.2           02/25/2022    2:35 PM  Renovascular   Renal Artery [place] Completed Yes    Disposition:    FU with Dr. Oval Linsey in one month   Medication Adjustments/Labs and Tests Ordered: Current medicines are reviewed at length with the patient today.  Concerns regarding medicines are outlined above.  No orders of the defined types were placed in this encounter.  No orders of the defined types were placed in this encounter.  Signed, Loel Dubonnet, NP  07/01/2022 8:55 AM    [place]

## 2022-07-02 DIAGNOSIS — R262 Difficulty in walking, not elsewhere classified: Secondary | ICD-10-CM | POA: Diagnosis not present

## 2022-07-02 DIAGNOSIS — H8110 Benign paroxysmal vertigo, unspecified ear: Secondary | ICD-10-CM | POA: Diagnosis not present

## 2022-07-02 DIAGNOSIS — M6281 Muscle weakness (generalized): Secondary | ICD-10-CM | POA: Diagnosis not present

## 2022-07-03 ENCOUNTER — Encounter (HOSPITAL_BASED_OUTPATIENT_CLINIC_OR_DEPARTMENT_OTHER): Payer: Self-pay | Admitting: Family

## 2022-07-05 ENCOUNTER — Ambulatory Visit: Payer: 59 | Attending: Cardiovascular Disease | Admitting: Cardiovascular Disease

## 2022-07-05 DIAGNOSIS — G4730 Sleep apnea, unspecified: Secondary | ICD-10-CM | POA: Diagnosis not present

## 2022-07-05 DIAGNOSIS — G4752 REM sleep behavior disorder: Secondary | ICD-10-CM | POA: Diagnosis not present

## 2022-07-05 DIAGNOSIS — R0683 Snoring: Secondary | ICD-10-CM

## 2022-07-05 DIAGNOSIS — I10 Essential (primary) hypertension: Secondary | ICD-10-CM | POA: Diagnosis not present

## 2022-07-05 DIAGNOSIS — G4736 Sleep related hypoventilation in conditions classified elsewhere: Secondary | ICD-10-CM

## 2022-07-05 DIAGNOSIS — I25119 Atherosclerotic heart disease of native coronary artery with unspecified angina pectoris: Secondary | ICD-10-CM | POA: Diagnosis not present

## 2022-07-06 DIAGNOSIS — R262 Difficulty in walking, not elsewhere classified: Secondary | ICD-10-CM | POA: Diagnosis not present

## 2022-07-06 DIAGNOSIS — H8110 Benign paroxysmal vertigo, unspecified ear: Secondary | ICD-10-CM | POA: Diagnosis not present

## 2022-07-06 DIAGNOSIS — M6281 Muscle weakness (generalized): Secondary | ICD-10-CM | POA: Diagnosis not present

## 2022-07-07 ENCOUNTER — Ambulatory Visit: Payer: 59 | Admitting: Orthopaedic Surgery

## 2022-07-08 DIAGNOSIS — M6281 Muscle weakness (generalized): Secondary | ICD-10-CM | POA: Diagnosis not present

## 2022-07-08 DIAGNOSIS — R262 Difficulty in walking, not elsewhere classified: Secondary | ICD-10-CM | POA: Diagnosis not present

## 2022-07-08 DIAGNOSIS — H8110 Benign paroxysmal vertigo, unspecified ear: Secondary | ICD-10-CM | POA: Diagnosis not present

## 2022-07-11 DIAGNOSIS — E039 Hypothyroidism, unspecified: Secondary | ICD-10-CM | POA: Diagnosis not present

## 2022-07-11 DIAGNOSIS — E1169 Type 2 diabetes mellitus with other specified complication: Secondary | ICD-10-CM | POA: Diagnosis not present

## 2022-07-11 DIAGNOSIS — E785 Hyperlipidemia, unspecified: Secondary | ICD-10-CM | POA: Diagnosis not present

## 2022-07-12 ENCOUNTER — Encounter (INDEPENDENT_AMBULATORY_CARE_PROVIDER_SITE_OTHER): Payer: Self-pay | Admitting: Gastroenterology

## 2022-07-12 ENCOUNTER — Ambulatory Visit (INDEPENDENT_AMBULATORY_CARE_PROVIDER_SITE_OTHER): Payer: 59 | Admitting: Gastroenterology

## 2022-07-13 ENCOUNTER — Telehealth (HOSPITAL_BASED_OUTPATIENT_CLINIC_OR_DEPARTMENT_OTHER): Payer: Self-pay | Admitting: *Deleted

## 2022-07-13 NOTE — Telephone Encounter (Signed)
Type Date User Summary Attachment  General 07/06/2022  4:27 PM Jill Side, CMA Auto: Referral message -  Note: ----- Message ----- From: Jill Side, CMA Sent: 07/06/2022   4:27 PM EDT To: Skeet Latch, MD   Per Dr Glenna Durand review he recommends a referral be placed to [facility] in [place] or [place] for in person option. Thanks!   . Type Date User Summary Attachment  General 07/06/2022  4:27 PM Jill Side, CMA - -  Note: Inbasket message sent to referring provider . Type Date User Summary Attachment  General 07/06/2022 11:59 AM Jacquelynn Cree, MD Auto: Referral message -  Note: ----- Message ----- From: Jacquelynn Cree, MD Sent: 07/06/2022  11:59 AM EDT To: Jill Side, CMA   From chart review it looks like patient wants in person provider and may have been previously recommended for IOP/PHP. Would recommend referral to [facility] in [place] or [place] for in person option. Thanks ----- Message ----- From: Jill Side, CMA Sent: 06/30/2022   3:28 PM EDT To: Jacquelynn Cree, MD   Please review for scheduling    Above message received regarding referral.  [facility] [place], [place]  [phone] Monday-Friday 8-5 walk ins [place] - ask for behavorial health  [facility]  [phone]  Called both facilities, neither require referral.   Left message to call back

## 2022-07-15 ENCOUNTER — Telehealth (INDEPENDENT_AMBULATORY_CARE_PROVIDER_SITE_OTHER): Payer: Self-pay | Admitting: Gastroenterology

## 2022-07-15 ENCOUNTER — Other Ambulatory Visit (HOSPITAL_COMMUNITY)
Admission: RE | Admit: 2022-07-15 | Discharge: 2022-07-15 | Disposition: A | Discharge status: Home / Self Care | Payer: 59 | Source: Ambulatory Visit | Attending: Gastroenterology | Admitting: Gastroenterology

## 2022-07-15 ENCOUNTER — Encounter (INDEPENDENT_AMBULATORY_CARE_PROVIDER_SITE_OTHER): Payer: Self-pay | Admitting: Gastroenterology

## 2022-07-15 ENCOUNTER — Ambulatory Visit (INDEPENDENT_AMBULATORY_CARE_PROVIDER_SITE_OTHER): Payer: 59 | Admitting: Gastroenterology

## 2022-07-15 VITALS — BP 164/96 | HR 60 | Temp 97.3°F | Ht 64.0 in | Wt 156.4 lb

## 2022-07-15 DIAGNOSIS — R194 Change in bowel habit: Secondary | ICD-10-CM | POA: Insufficient documentation

## 2022-07-15 DIAGNOSIS — R112 Nausea with vomiting, unspecified: Secondary | ICD-10-CM | POA: Insufficient documentation

## 2022-07-15 DIAGNOSIS — K921 Melena: Secondary | ICD-10-CM | POA: Insufficient documentation

## 2022-07-15 DIAGNOSIS — K581 Irritable bowel syndrome with constipation: Secondary | ICD-10-CM | POA: Diagnosis not present

## 2022-07-15 DIAGNOSIS — K589 Irritable bowel syndrome without diarrhea: Secondary | ICD-10-CM | POA: Insufficient documentation

## 2022-07-15 DIAGNOSIS — R1010 Upper abdominal pain, unspecified: Secondary | ICD-10-CM | POA: Insufficient documentation

## 2022-07-15 DIAGNOSIS — R140 Abdominal distension (gaseous): Secondary | ICD-10-CM

## 2022-07-15 LAB — CBC WITH DIFFERENTIAL/PLATELET
Abs Immature Granulocytes: 0.01 10*3/uL (ref 0.00–0.07)
Basophils Absolute: 0 10*3/uL (ref 0.0–0.1)
Basophils Relative: 1 %
Eosinophils Absolute: 0.1 10*3/uL (ref 0.0–0.5)
Eosinophils Relative: 1 %
HCT: 41.6 % (ref 36.0–46.0)
Hemoglobin: 13.5 g/dL (ref 12.0–15.0)
Immature Granulocytes: 0 %
Lymphocytes Relative: 55 %
Lymphs Abs: 3.2 10*3/uL (ref 0.7–4.0)
MCH: 28 pg (ref 26.0–34.0)
MCHC: 32.5 g/dL (ref 30.0–36.0)
MCV: 86.3 fL (ref 80.0–100.0)
Monocytes Absolute: 0.4 10*3/uL (ref 0.1–1.0)
Monocytes Relative: 7 %
Neutro Abs: 2.1 10*3/uL (ref 1.7–7.7)
Neutrophils Relative %: 36 %
Platelets: 243 10*3/uL (ref 150–400)
RBC: 4.82 MIL/uL (ref 3.87–5.11)
RDW: 14.6 % (ref 11.5–15.5)
WBC: 5.8 10*3/uL (ref 4.0–10.5)
nRBC: 0 % (ref 0.0–0.2)

## 2022-07-15 LAB — COMPREHENSIVE METABOLIC PANEL
ALT: 17 U/L (ref 0–44)
AST: 21 U/L (ref 15–41)
Albumin: 4 g/dL (ref 3.5–5.0)
Alkaline Phosphatase: 110 U/L (ref 38–126)
Anion gap: 7 (ref 5–15)
BUN: 8 mg/dL (ref 6–20)
CO2: 25 mmol/L (ref 22–32)
Calcium: 8.9 mg/dL (ref 8.9–10.3)
Chloride: 106 mmol/L (ref 98–111)
Creatinine, Ser: 0.83 mg/dL (ref 0.44–1.00)
GFR, Estimated: >60 mL/min (ref >60)
Glucose, Bld: 94 mg/dL (ref 70–99)
Potassium: 3.8 mmol/L (ref 3.5–5.1)
Sodium: 138 mmol/L (ref 135–145)
Total Bilirubin: 0.7 mg/dL (ref 0.3–1.2)
Total Protein: 7.2 g/dL (ref 6.5–8.1)

## 2022-07-15 LAB — TSH: TSH: 4.05 u[IU]/mL (ref 0.350–4.500)

## 2022-07-15 MED ORDER — LINACLOTIDE 290 MCG PO CAPS: 290.0000 ug | ORAL_CAPSULE | Freq: Every day | ORAL | 3 refills | Status: DC

## 2022-07-15 NOTE — Telephone Encounter (Signed)
Attempted to contact pt to make her aware to hold Plavix x 5 days. Call could not be completed at this time. (Pt scheduled for 08/18/22 for EGD)  Primary Cardiologist: Pixie Casino, MD   Chart reviewed as part of pre-operative protocol coverage. Given past medical history and time since last visit, based on ACC/AHA guidelines, Bethany Wang would be at acceptable risk for the planned procedure without further cardiovascular testing.    Her Plavix may be held for 5 days prior to her procedure.  Her aspirin should be continued throughout her procedure.  Please resume Plavix as soon as hemostasis is achieved.    I will route this recommendation to the requesting party via Epic fax function and remove from pre-op pool.   Please call with questions.   Jossie Ng. Cleaver NP-C      07/15/2022, 12:31 PM [address] Office [phone] Fax [phone]

## 2022-07-15 NOTE — Patient Instructions (Addendum)
Increase Linzess to 290 mcg If no improvement in bowel movements after 3 weeks, start Miralax 1 capful daily, up to 3 capfuls per day Perform blood workup Schedule EGD  The patient was found to have elevated blood pressure when vital signs were checked in the office. The blood pressure was rechecked by the nursing staff and it was found be persistently elevated >140/90 mmHg. I personally advised to the patient to follow up closely with PCP for hypertension control.

## 2022-07-15 NOTE — Telephone Encounter (Signed)
     Primary Cardiologist: Pixie Casino, MD  Chart reviewed as part of pre-operative protocol coverage. Given past medical history and time since last visit, based on ACC/AHA guidelines, Bethany Wang would be at acceptable risk for the planned procedure without further cardiovascular testing.   Her Plavix may be held for 5 days prior to her procedure.  Her aspirin should be continued throughout her procedure.  Please resume Plavix as soon as hemostasis is achieved.   I will route this recommendation to the requesting party via Epic fax function and remove from pre-op pool.  Please call with questions.  Jossie Ng. Jamisen Hawes NP-C     07/15/2022, 12:31 PM [address] Office [phone] Fax [phone]

## 2022-07-15 NOTE — Progress Notes (Signed)
Bethany Wang, M.D. Gastroenterology & Hepatology [facility] [address] [place], [place] [address]  Primary Care Physician: Macon Nation, MD [place] [address]  I will communicate my assessment and recommendations to the referring MD via EMR.  Problems: IBS-C History of H. pylori status post Pylera  History of Present Illness: Bethany Wang is a 57 y.o. female with past medical history of breast cancer status post chemoradiation, coronary artery disease status post stent placement, brain tumor, depression, diabetes, hypertension, hyperlipidemia, stroke, seizures, who presents for follow up of constipation.  The patient was last seen on 05/20/2020. At that time, the patient was being evaluated for chronic nausea in the setting of dual antiplatelet use (was on Plavix and 4 baby aspirin's daily).  ESR was checked which was elevated at 75, TSH was normal at 4.24.  Patient had a HIDA scan performed on 05/28/2020 which was within normal limits.  Underwent a CT angio of the abdomen and pelvis which was within normal limits.  Patient reports that she has felt her constipation has worsened for the last 3 months. She reports that she was having some improvement with Linzess 145 mcg in the past, but it has stopped working. She has to strain significantly to have a bowel movement, is having small pebbles 3 times a week. She reports having significant bloating in her abdomen, which slightly improves when she moves her bowels.  Patient reports that she has felt upper abdominal pain described as burning pain regularly. Patient reports that she has noticed her stools are jet black. She also reports having nausea regularly and vomits every couple of days. She is eating less than usual 1-2 times a day as she has frequent hiccups and having significant bloating.  The patient denies having any nausea, vomiting, fever, chills, hematochezia,  melena, hematemesis, diarrhea, jaundice, pruritus. No steady weight loss.  She is taking Plavix 75 mg qday and aspirin 81 mg qday. Not taking any PPI.  Last EGD: 11/28/2019 With in the proximal esophagus dilated with a savory up to 71 [place], gastritis) positive for H. pylori), normal duodenum.  Last Colonoscopy: 01/05/2017 6 polyps were removed from the sigmoid colon (1 tubular adenoma, rest were hyperplastic polyps), external hemorrhoids Repeat in 7 years  Past Medical History: Past Medical History:  Diagnosis Date   Anemia    Anxiety    Bilateral breast cysts 12/30/2015   Brain tumor    Breast cancer    Left Breast Cancer   Breast disorder    BV (bacterial vaginosis) 09/09/2015   Coronary atherosclerosis of native coronary artery    a. s/p DES to RCA in 2013 b. DES to LAD in 2014 c. cath in 04/2016 showing patent LAD stent with D2 jailed by LAD stent and CTO of RCA with left to right collaterals present   Cyst of pharynx or nasopharynx    [name]   Decreased libido 12/25/2018   Depression    Diabetes mellitus without complication    Essential hypertension    Falls    x 3-4 in past year   GERD (gastroesophageal reflux disease)    Headache    Heart attack    x4   History of hematuria    Mixed hyperlipidemia    Personal history of chemotherapy    Left Breast Cancer   Personal history of radiation therapy    Left Breast Cancer   Pre-diabetes    PUD (peptic  ulcer disease)    Seizures    Shingles 09/09/2015   Stroke    12-2017 on Plavix, only deficit is headaches   Suicide attempt    3 attempts in remote past   Trichimoniasis 08/20/2019   Treated 08/20/19    Uterine cancer    Vitamin D deficiency disease 04/03/2019    Past Surgical History: Past Surgical History:  Procedure Laterality Date   ABDOMINAL HYSTERECTOMY     BIOPSY  11/28/2019   Procedure: BIOPSY;  Surgeon: Rogene Houston, MD;  [facility];  Service: Endoscopy;;   antral, gastric body    BLADDER SURGERY     BREAST LUMPECTOMY Left    BREAST LUMPECTOMY Right 02/24/2018   Procedure: RIGHT BREAST LUMPECTOMY ERAS PATHWAY;  Surgeon: Jovita Kussmaul, MD;  Location: [facility];  Service: General;  Laterality: Right;   CARDIAC CATHETERIZATION N/A 05/10/2016   Procedure: Left Heart Cath and Coronary Angiography;  Surgeon: Belva Crome, MD;  Location: [facility];  Service: Cardiovascular;  Laterality: N/A;   CAUTERIZE INNER NOSE  07/13/2020   COLONOSCOPY  May 2010   Fleishman: normal rectum, internal hemorrhoids, , benign colonic polyp   COLONOSCOPY N/A 01/05/2017   Procedure: COLONOSCOPY;  Surgeon: Rogene Houston, MD;  [facility];  Service: Endoscopy;  Laterality: N/A;  1:00   CORONARY PRESSURE/FFR STUDY N/A 10/23/2020   Procedure: INTRAVASCULAR PRESSURE WIRE/FFR STUDY;  Surgeon: Belva Crome, MD;  Location: [facility];  Service: Cardiovascular;  Laterality: N/A;   CORONARY PRESSURE/FFR STUDY N/A 03/30/2021   Procedure: INTRAVASCULAR PRESSURE WIRE/FFR STUDY;  Surgeon: Nelva Bush, MD;  Location: [facility];  Service: Cardiovascular;  Laterality: N/A;   ESOPHAGEAL DILATION  11/28/2019   Procedure: ESOPHAGEAL DILATION;  Surgeon: Rogene Houston, MD;  [facility];  Service: Endoscopy;;   ESOPHAGOGASTRODUODENOSCOPY     2001 Dr. Amedeo Plenty: distal esophagitis, small hiatal hernia,. Dr. Tamala Julian 2006? no records available currently, pt also reports  EGD a few years ago with Dr. Gala Romney, do not have these reports anywhere in medical records   ESOPHAGOGASTRODUODENOSCOPY  06/02/2011   [id number] pill impaction as described above s/p dilation of a probable cervical esophageal web/bx abnormal esophageal and gastric mucosa. + H.pylori gastritis    ESOPHAGOGASTRODUODENOSCOPY (EGD) WITH PROPOFOL N/A 11/28/2019   Procedure: ESOPHAGOGASTRODUODENOSCOPY (EGD) WITH PROPOFOL;  Surgeon: Rogene Houston, MD;  [facility];  Service:  Endoscopy;  Laterality: N/A;  1020   EYE SURGERY     Removed glass   HAND SURGERY Right    Left breast lumpectomy     Benign   LEFT HEART CATH AND CORONARY ANGIOGRAPHY N/A 10/25/2018   Procedure: LEFT HEART CATH AND CORONARY ANGIOGRAPHY;  Surgeon: Wellington Hampshire, MD;  Location: [facility];  Service: Cardiovascular;  Laterality: N/A;   LEFT HEART CATH AND CORONARY ANGIOGRAPHY N/A 10/23/2020   Procedure: LEFT HEART CATH AND CORONARY ANGIOGRAPHY;  Surgeon: Belva Crome, MD;  Location: [facility];  Service: Cardiovascular;  Laterality: N/A;   LEFT HEART CATH AND CORONARY ANGIOGRAPHY N/A 03/30/2021   Procedure: LEFT HEART CATH AND CORONARY ANGIOGRAPHY;  Surgeon: Nelva Bush, MD;  Location: [facility];  Service: Cardiovascular;  Laterality: N/A;   LEFT HEART CATH AND CORONARY ANGIOGRAPHY N/A 01/22/2022   Procedure: LEFT HEART CATH AND CORONARY ANGIOGRAPHY;  Surgeon: Sherren Mocha, MD;  Location: [facility];  Service: Cardiovascular;  Laterality: N/A;  LEFT HEART CATHETERIZATION WITH CORONARY ANGIOGRAM N/A 07/03/2014   Procedure: LEFT HEART CATHETERIZATION WITH CORONARY ANGIOGRAM;  Surgeon: Belva Crome, MD;  Location: [facility];  Service: Cardiovascular;  Laterality: N/A;   POLYPECTOMY  01/05/2017   Procedure: POLYPECTOMY;  Surgeon: Rogene Houston, MD;  [facility];  Service: Endoscopy;;  colon   SHOULDER SURGERY Left     Family History: Family History  Problem Relation Age of Onset   Colon cancer Paternal Grandfather    Cancer Father    Cancer Maternal Uncle    Alzheimer's disease Paternal Aunt    Cirrhosis Maternal Uncle    Cancer Paternal Aunt        lung   Aneurysm Mother        brain   Heart disease Brother    Heart attack Brother    Mental illness Daughter    ADD / ADHD Daughter    Bipolar disorder Daughter    Mental illness Son    ADD / ADHD Son    Bipolar disorder Son    Mental illness Son    ADD / ADHD Son     Bipolar disorder Son     Social History: Social History   Tobacco Use  Smoking Status Former   Packs/day: 0.20   Years: 32.00   Additional pack years: 0.00   Total pack years: 6.40   Types: Cigarettes, Cigars   Start date: 07/05/1983   Quit date: 04/03/2012   Years since quitting: 10.2  Smokeless Tobacco Never   Social History   Substance and Sexual Activity  Alcohol Use No   Alcohol/week: 0.0 standard drinks of alcohol   Social History   Substance and Sexual Activity  Drug Use No    Allergies: Allergies  Allergen Reactions   Bee Venom Anaphylaxis and Other (See Comments)    "throat swelled and had to the hospital" - Yellow jacket sting   Lipitor [Atorvastatin] Rash and Other (See Comments)    Liver damage & Upper Abdominal pain, Break out like a rash . Per last hosp visit. It exposed on inpatient stay.   Penicillins Anaphylaxis and Other (See Comments)    Has patient had a PCN reaction causing immediate rash, facial/tongue/throat swelling, SOB or lightheadedness with hypotension: Yes Has patient had a PCN reaction causing severe rash involving mucus membranes or skin necrosis: Yes Has patient had a PCN reaction that required hospitalization Yes Has patient had a PCN reaction occurring within the last 10 years: Yes If all of the above answers are "NO", then may proceed with Cephalosporin use. Patient states she carries an epi-pen   Shellfish Allergy Swelling   Spironolactone Other (See Comments)    Nausea, dehydration   Tomato Itching and Swelling   Valsartan Itching   Zofran [Ondansetron] Itching   Crestor [Rosuvastatin] Rash and Other (See Comments)    Upper Abdominal pain   Ranolazine Rash    Medications: Current Outpatient Medications  Medication Sig Dispense Refill   albuterol (VENTOLIN HFA) 108 (90 Base) MCG/ACT inhaler Inhale 2 puffs into the lungs every 4 (four) hours as needed for wheezing or shortness of breath. 1 each 3   ALPRAZolam (XANAX) 0.5 MG  tablet Take 1 tablet (0.5 mg total) by mouth 2 (two) times daily as needed for anxiety. 30 tablet 0   amLODipine (NORVASC) 10 MG tablet TAKE 1 TABLET ONCE DAILY. 90 tablet 3   aspirin EC 81 MG tablet Take 1 tablet (81 mg total) by  mouth daily. 180 tablet 1   blood glucose meter kit and supplies KIT Use to check blood sugar daily. 1 each 0   carvedilol (COREG) 25 MG tablet Take 1 tablet (25 mg total) by mouth 2 (two) times daily. 60 tablet 2   Cholecalciferol (DIALYVITE VITAMIN D 5000) 125 MCG (5000 UT) capsule Take 5,000 Units by mouth daily.     clopidogrel (PLAVIX) 75 MG tablet Take 75 mg by mouth daily.     diphenhydrAMINE (BENADRYL) 25 MG tablet Take 25 mg by mouth every 6 (six) hours as needed for allergies.     doxazosin (CARDURA) 2 MG tablet Take 1 tablet (2 mg total) by mouth at bedtime. 30 tablet 2   escitalopram (LEXAPRO) 20 MG tablet Take 1 tablet (20 mg total) by mouth daily. 30 tablet 3   Evolocumab (REPATHA SURECLICK [place]) Inject [id number] mg into the skin every 14 (fourteen) days.     fluorometholone (FML) 0.1 % ophthalmic suspension daily.     fluticasone-salmeterol (ADVAIR) 100-50 MCG/ACT AEPB INHALE 1 PUFF BY MOUTH TWICE DAILY 60 each 10   isosorbide mononitrate (IMDUR) 60 MG 24 hr tablet Take 1 tablet (60 mg total) by mouth 2 (two) times daily. 60 tablet 5   LINZESS 145 MCG CAPS capsule Take 1 capsule (145 mcg total) by mouth daily. 30 capsule 3   nitroGLYCERIN (NITROSTAT) 0.4 MG SL tablet DISSOLVE 1 TABLET UNDER THE TONGUE AS NEEDED FOR CHEST PAIN EVERY 5 MINUTES UP TO 3 TIMES. IF NO RELIEF CALL 911. 25 tablet 1   RESTASIS 0.05 % ophthalmic emulsion Place 1 drop into both eyes 2 (two) times daily.     traZODone (DESYREL) 50 MG tablet Take 1-2 tablets by mouth at bedtime.     Current Facility-Administered Medications  Medication Dose Route Frequency Provider Last Rate Last Admin   NONFORMULARY OR COMPOUNDED ITEM 0.2 mL  0.2 mL Intramuscular Q7 days [name], Nimish C, MD   0.2 mL at  10/23/19 1002    Review of Systems: GENERAL: negative for malaise, night sweats HEENT: No changes in hearing or vision, no nose bleeds or other nasal problems. NECK: Negative for lumps, goiter, pain and significant neck swelling RESPIRATORY: Negative for cough, wheezing CARDIOVASCULAR: Negative for chest pain, leg swelling, palpitations, orthopnea GI: SEE HPI MUSCULOSKELETAL: Negative for joint pain or swelling, back pain, and muscle pain. SKIN: Negative for lesions, rash PSYCH: Negative for sleep disturbance, mood disorder and recent psychosocial stressors. HEMATOLOGY Negative for prolonged bleeding, bruising easily, and swollen nodes. ENDOCRINE: Negative for cold or heat intolerance, polyuria, polydipsia and goiter. NEURO: negative for tremor, gait imbalance, syncope and seizures. The remainder of the review of systems is noncontributory.   Physical Exam: BP (!) 164/96 (BP Location: Left Arm, Patient Position: Sitting, Cuff Size: Normal)   Pulse 60   Temp (!) 97.3 F (36.3 C) (Temporal)   Ht 5\' 4"  (1.626 m)   Wt 156 lb 6.4 oz (70.9 kg)   BMI 26.85 kg/m  GENERAL: The patient is AO x3, in no acute distress. HEENT: Head is normocephalic and atraumatic. EOMI are intact. Mouth is well hydrated and without lesions. NECK: Supple. No masses LUNGS: Clear to auscultation. No presence of rhonchi/wheezing/rales. Adequate chest expansion HEART: RRR, normal s1 and s2. ABDOMEN: tender to palpation in the epigastric area, no guarding, no peritoneal signs, and nondistended. BS +. No masses. EXTREMITIES: Without any cyanosis, clubbing, rash, lesions or edema. NEUROLOGIC: AOx3, no focal motor deficit. SKIN: no jaundice, no rashes  Imaging/Labs: as above  I personally reviewed and interpreted the available labs, imaging and endoscopic files.  Impression and Plan: LEAHA RODELL is a 57 y.o. female with past medical history of breast cancer status post chemoradiation, coronary artery  disease status post stent placement, brain tumor, depression, diabetes, hypertension, hyperlipidemia, stroke, seizures, who presents for follow up of constipation. Patient has presented worsening constipation despite taking Linzess compliantly. She has presenting significant abdominal discomfort due to this as well. We will check CBC, CMP and TSH to evaluate for reversible constipation etiologies. In the interim, Linzess will be increased to 290 mcg daily and can take rescue doses of Miralax if still not achieving significant symptom control. May consider anorectal manometry if refractory to Linzess.  Finally, she has presented significant abdominal pain in the epigastric area. This will be further evaluated with an EGD.  The patient was found to have elevated blood pressure when vital signs were checked in the office. The blood pressure was rechecked by the nursing staff and it was found be persistently elevated >140/90 mmHg. I personally advised to the patient to follow up closely with PCP for hypertension control.  -Increase Linzess to 290 mcg -If no improvement in bowel movements after 3 weeks, start Miralax 1 capful daily, up to 3 capfuls per day - Consider anorectal manometry if persisting constipation -Check CBC, CMP and TSH -Schedule EGD   All questions were answered.      Bethany Peppers, MD Gastroenterology and Hepatology [facility]

## 2022-07-15 NOTE — Telephone Encounter (Signed)
    07/15/22  Bethany Wang 04-05-1966  What type of surgery is being performed? EDG  When is surgery scheduled? 08/18/22  Clearance to hold Plavix  Name of physician performing surgery?  Dr. Maylon Peppers [facility] at [facility] Phone: [phone] Fax: [phone]  Anethesia type (none, local, MAC, general)? MAC

## 2022-07-16 ENCOUNTER — Encounter: Payer: Self-pay | Admitting: Cardiovascular Disease

## 2022-07-16 DIAGNOSIS — R262 Difficulty in walking, not elsewhere classified: Secondary | ICD-10-CM | POA: Diagnosis not present

## 2022-07-16 DIAGNOSIS — H8110 Benign paroxysmal vertigo, unspecified ear: Secondary | ICD-10-CM | POA: Diagnosis not present

## 2022-07-16 DIAGNOSIS — M6281 Muscle weakness (generalized): Secondary | ICD-10-CM | POA: Diagnosis not present

## 2022-07-16 NOTE — Procedures (Signed)
[facility]        Patient Name: Bethany Wang, Bethany Wang Date: 07/05/2022 Gender: Female D.O.B: [birth year] Age (years): 56 Referring Provider: Chilton Si Height (inches): 64 Interpreting Physician: Nicki Guadalajara MD, ABSM Weight (lbs): 154 RPSGT: Alfonso Ellis BMI: 26 MRN: [id number] Neck Size: 14.00  CLINICAL INFORMATION Sleep Study Type: NPSG  Indication for sleep study: Snoring, difficult to control hypertension. Leg or body jerks  Epworth Sleepiness Score: 2  SLEEP STUDY TECHNIQUE As per the AASM Manual for the Scoring of Sleep and Associated Events v2.3 (April 2016) with a hypopnea requiring 4% desaturations.  The channels recorded and monitored were frontal, central and occipital EEG, electrooculogram (EOG), submentalis EMG (chin), nasal and oral airflow, thoracic and abdominal wall motion, anterior tibialis EMG, snore microphone, electrocardiogram, and pulse oximetry.  MEDICATIONS albuterol (VENTOLIN HFA) 108 (90 Base) MCG/ACT inhaler ALPRAZolam (XANAX) 0.5 MG tablet amLODipine (NORVASC) 10 MG tablet aspirin EC 81 MG tablet blood glucose meter kit and supplies KIT carvedilol (COREG) 25 MG tablet Cholecalciferol (DIALYVITE VITAMIN D 5000) 125 MCG (5000 UT) capsule clopidogrel (PLAVIX) 75 MG tablet diphenhydrAMINE (BENADRYL) 25 MG tablet doxazosin (CARDURA) 2 MG tablet escitalopram (LEXAPRO) 20 MG tablet Evolocumab (REPATHA SURECLICK [place]) fluorometholone (FML) 0.1 % ophthalmic suspension fluticasone-salmeterol (ADVAIR) 100-50 MCG/ACT AEPB isosorbide mononitrate (IMDUR) 60 MG 24 hr tablet linaclotide (LINZESS) 290 MCG CAPS capsule nitroGLYCERIN (NITROSTAT) 0.4 MG SL tablet RESTASIS 0.05 % ophthalmic emulsion Medications self-administered by patient taken the night of the study : N/A  SLEEP ARCHITECTURE The study was initiated at 9:57:35 PM and ended at 4:39:37 AM.  Sleep onset time was 23.3 minutes and the sleep  efficiency was 91.3%. The total sleep time was 367.2 minutes.  Stage REM latency was 146.0 minutes.  The patient spent 2.31% of the night in stage N1 sleep, 40.91% in stage N2 sleep, 33.36% in stage N3 and 23.4% in REM.  Alpha intrusion was absent.  Supine sleep was 0.53%.  RESPIRATORY PARAMETERS The overall apnea/hypopnea index (AHI) was 2.6 per hour.  The respiratory disturbance idex (RDI) was 2.9/h. There were 0 total apneas, including 0 obstructive, 0 central and 0 mixed apneas. There were 16 hypopneas and 2 RERAs.  The AHI during Stage REM sleep was 11.2 per hour.  AHI while supine was 0.0 per hour with very minimal supine sleep.  The mean oxygen saturation was 91.14%. The minimum SpO2 during sleep was 84.00%.  Moderate snoring was noted during this study.  CARDIAC DATA The 2 lead EKG demonstrated sinus rhythm. The mean heart rate was 68.92 beats per minute. Other EKG findings include: None.  LEG MOVEMENT DATA The total PLMS were 0 with a resulting PLMS index of 0.00. Associated arousal with leg movement index was 0.0 .  IMPRESSIONS - No significant obstructive sleep apnea overall (AHI 2.6/h); however, mild sleep apnea was present during REM sleep (AHI 11.2/h) and supine sleep was minimal. - Mild oxygen desaturation to a nadir of 84%). - The patient snored with moderate snoring volume. - No cardiac abnormalities were noted during this study. - Reported possible REM behavior disorder by tech. - Clinically significant periodic limb movements did not occur during sleep. No significant associated arousals.  DIAGNOSIS - Sleep Apnea, unspecified (G47.30) - Possible REM Behavior Disorder (G47.52) - Nocturnal Hypoxemia (  G47.36)  RECOMMENDATIONS - At present patient does not meet criteria for CPAP therapy. - Effort should be made to optimize nasal and oropharyngeal patency. - Avoid alcohol, sedatives and other CNS depressants that may worsen sleep apnea and disrupt normal sleep  architecture. - Monitor for future sleep related behavior disorder. - Sleep hygiene should be reviewed to assess factors that may improve sleep quality. - Weight management and regular exercise should be initiated or continued if appropriate.  [Electronically signed] 07/16/2022 11:38 AM  Nicki Guadalajara MD, [facility], ABSM Diplomate, American Board of Sleep Medicine  NPI: [id number]  [facility] PH: [phone]   FX: [id number] ACCREDITED BY THE AMERICAN ACADEMY OF SLEEP MEDICINE

## 2022-07-20 ENCOUNTER — Encounter (INDEPENDENT_AMBULATORY_CARE_PROVIDER_SITE_OTHER): Payer: Self-pay

## 2022-07-20 DIAGNOSIS — M6281 Muscle weakness (generalized): Secondary | ICD-10-CM | POA: Diagnosis not present

## 2022-07-20 DIAGNOSIS — H8110 Benign paroxysmal vertigo, unspecified ear: Secondary | ICD-10-CM | POA: Diagnosis not present

## 2022-07-20 DIAGNOSIS — R262 Difficulty in walking, not elsewhere classified: Secondary | ICD-10-CM | POA: Diagnosis not present

## 2022-07-20 NOTE — Telephone Encounter (Signed)
Attempted to contact pt but call can not be completed at this time. Will send my chart message

## 2022-07-22 ENCOUNTER — Telehealth: Payer: Self-pay | Admitting: *Deleted

## 2022-07-22 NOTE — Telephone Encounter (Signed)
Attempted to contact patien with sleep study results. No answer or machine pick up.

## 2022-07-26 NOTE — Telephone Encounter (Signed)
Attempted to contact pt - no answer

## 2022-07-28 NOTE — Telephone Encounter (Signed)
Letter sent to pt

## 2022-07-28 NOTE — Telephone Encounter (Signed)
Unable to reach patient x3

## 2022-07-29 ENCOUNTER — Other Ambulatory Visit (HOSPITAL_BASED_OUTPATIENT_CLINIC_OR_DEPARTMENT_OTHER): Payer: Self-pay | Admitting: Cardiovascular Disease

## 2022-07-29 ENCOUNTER — Telehealth: Payer: Self-pay

## 2022-07-29 DIAGNOSIS — Z0000 Encounter for general adult medical examination without abnormal findings: Secondary | ICD-10-CM

## 2022-07-29 DIAGNOSIS — I10 Essential (primary) hypertension: Secondary | ICD-10-CM

## 2022-07-29 NOTE — Telephone Encounter (Signed)
Called patient regarding missing bp readings in [employer]. Was not able to leave a message due to no voicemail option and prompt to try call again later.   Renaee Munda, MS, ERHD, [facility]  Care Guide, Health & Wellness Coach [address] [place] [address] Telephone: [phone] Email: [email or web address]

## 2022-08-09 ENCOUNTER — Telehealth: Payer: Self-pay

## 2022-08-09 DIAGNOSIS — Z0000 Encounter for general adult medical examination without abnormal findings: Secondary | ICD-10-CM

## 2022-08-09 NOTE — Telephone Encounter (Signed)
Called patient to remind her to bring the Vivify device to return at her appointment on 4/30 with Dr. Duke Salvia. Received message that call could not be completed at this time and was not able to leave a message due to no voicemail option.    Renaee Munda, MS, ERHD, [facility]  Care Guide, Health & Wellness Coach [address], [address] [place] [address] Telephone: [phone] Email: [email or web address]

## 2022-08-10 ENCOUNTER — Other Ambulatory Visit (HOSPITAL_BASED_OUTPATIENT_CLINIC_OR_DEPARTMENT_OTHER): Payer: Self-pay | Admitting: Cardiovascular Disease

## 2022-08-10 ENCOUNTER — Other Ambulatory Visit: Payer: 59

## 2022-08-10 ENCOUNTER — Encounter (HOSPITAL_BASED_OUTPATIENT_CLINIC_OR_DEPARTMENT_OTHER): Payer: Self-pay | Admitting: Cardiovascular Disease

## 2022-08-10 ENCOUNTER — Other Ambulatory Visit: Payer: Self-pay | Admitting: *Deleted

## 2022-08-10 ENCOUNTER — Ambulatory Visit (INDEPENDENT_AMBULATORY_CARE_PROVIDER_SITE_OTHER): Payer: 59 | Admitting: Cardiovascular Disease

## 2022-08-10 VITALS — BP 185/96 | HR 79 | Ht 64.0 in | Wt 154.5 lb

## 2022-08-10 DIAGNOSIS — Z5181 Encounter for therapeutic drug level monitoring: Secondary | ICD-10-CM

## 2022-08-10 DIAGNOSIS — I25119 Atherosclerotic heart disease of native coronary artery with unspecified angina pectoris: Secondary | ICD-10-CM

## 2022-08-10 DIAGNOSIS — R030 Elevated blood-pressure reading, without diagnosis of hypertension: Secondary | ICD-10-CM

## 2022-08-10 DIAGNOSIS — R55 Syncope and collapse: Secondary | ICD-10-CM

## 2022-08-10 DIAGNOSIS — I1A0 Resistant hypertension: Secondary | ICD-10-CM

## 2022-08-10 DIAGNOSIS — Z006 Encounter for examination for normal comparison and control in clinical research program: Secondary | ICD-10-CM

## 2022-08-10 DIAGNOSIS — R300 Dysuria: Secondary | ICD-10-CM

## 2022-08-10 DIAGNOSIS — E782 Mixed hyperlipidemia: Secondary | ICD-10-CM

## 2022-08-10 LAB — URINALYSIS
Bilirubin, UA: NEGATIVE
Glucose, UA: NEGATIVE
Ketones, UA: NEGATIVE
Nitrite, UA: NEGATIVE
Protein,UA: NEGATIVE
RBC, UA: NEGATIVE
Specific Gravity, UA: 1.011 (ref 1.005–1.030)
Urobilinogen, Ur: 0.2 mg/dL (ref 0.2–1.0)
pH, UA: 6.5 (ref 5.0–7.5)

## 2022-08-10 MED ORDER — POTASSIUM CHLORIDE CRYS ER 20 MEQ PO TBCR: 20.0000 meq | EXTENDED_RELEASE_TABLET | Freq: Two times a day (BID) | ORAL | 1 refills | Status: DC

## 2022-08-10 MED ORDER — AMLODIPINE BESYLATE 10 MG PO TABS: 10.0000 mg | ORAL_TABLET | Freq: Every evening | ORAL | 3 refills | Status: DC

## 2022-08-10 MED ORDER — CHLORTHALIDONE 25 MG PO TABS: 25.0000 mg | ORAL_TABLET | Freq: Every day | ORAL | 3 refills | Status: DC

## 2022-08-10 NOTE — Telephone Encounter (Signed)
Patient seen in clinic today

## 2022-08-10 NOTE — Progress Notes (Signed)
[facility]pert[facility] Follow-up:    Date:  08/10/2022   ID:  Bethany Wang, DOB 07/18/1965, MRN [id number]  PCP:  Donetta Potts, MD  Cardiologist:  Chrystie Nose, MD   Referring MD: Donetta Potts, MD   CC: Hypertension  History of Present Illness:    Bethany Wang is a 57 y.o. female with a hx of CAD (s/p DES to RCA 2013, DES to LAD 2014, cath 04/2016 and 10/2020 and 01/2022 patent LAD stent with jailed D2 by LAD stent and CTO RCA to L-R collaterals), hypertension, and diabetes type 2. She was initially seen in the [facility] by Gillian Shields, NP 01/29/2022. Per her notes, the patient was previously hospitalized 10/2018 and underwent LHC with occlusion of RCA with previously placed coronary stent and left-to-right collaterals; 50% ISR of proximal LAD and 90% diagonal ostial narrowing; also 60-70% obtuse marginal narrowing of 50% distal circumflex disease. She was recommended for medical therapy. Repeat catheterizations 10/2020 and 03/2021 were stable. Imdur was adjusted to 60 mg BID; ranolazine was trialed but discontinued after she developed a rash. She was seen 10/15/2021 and her amlodipine, HCTZ, and thyroid medications were on hold per her PCP but unclear why. Her BP was 203/103 initially, and 190/90 on recheck. She noted having an argument prior to the office visit and declined medication changes. HCTZ and amlodipine was resumed. She reported palpitations and wore a monitor showing predominantly NSR, and infrequent PVCs/PACs. Carotid dopplers 10/2021 showed no significant stenosis. In 01/2022 she developed worsening angina and was seen by Dr. Rennis Golden. She underwent cardiac catheterization 01/22/22  revealing stable multivessel coronary disease with CTO of proximal RCA collateralized from LCA, mild to moderate ISR of the LAD with previous negative flow wire, moderately severe OM stenosis and small OM branch with negative flow wire in the past. It was suspected  her symptoms were driven by supply/demand mismatch and she was recommended for more aggressive antihypertensive treatment. At her initial visit with Gillian Shields, NP her BP was 148/82. She was started on 12.5 mg carvedilol BID, and metoprolol was discontinued. She continued on Amlodipine 10mg  daily, HCTZ 25mg  QD, Imdur 60mg  BID. She was enrolled in the RPM study. Cortisol, catecholamines, metanephrines, and renin-aldosterone were normal. Renal artery dopplers 02/2022 revealed mild to moderate right renal artery stenosis without significant stenosis on the left. She reported snoring and was referred for a sleep study which was completed 01/2022. She also has known intolerance to Atorvastatin, Rosuvastatin and Repatha initiated. She followed up with our pharmacist 03/24/2022 and HCTZ was stopped. She was started on 25 mg chlorthalidone daily.  On 04/25/2022 she presented to the ED with complaints of chest tightness. She was noted to be hypertensive at 185/88. Troponins x2 were negative. Monitoring and EKG were negative for ischemia or arrhythmia. Her symptoms improved with albuterol use, and were thought to be due to some type of viral syndrome or reactive airway disease.  At her visit 05/2022 blood pressures were averaging in the 130s to 140s.  She was under a lot of stress from her relationship with her husband.  She was exercising regularly.  Chlorthalidone was switched to spironolactone and her potassium supplement was discontinued.  Carvedilol was increased to 25 mg twice daily.  She follow-up with Gillian Shields, NP 06/2022 and reported stopping spironolactone due to nausea and dehydration.  She was started on doxazosin.  She is not interested in starting chlorthalidone again.  She had a sleep study 06/2022 that  showed no significant sleep apnea overall but did have some mild sleep apnea during REM sleep.  She did not meet criteria for CPAP therapy.  She has not been checking her blood pressure consistently in  the vivify remote patient monitoring system.  However when she does her blood pressure averages in the 120s to 140s over 60s to 70s.  Bethany Wang reports multiple recent episodes of passing out, occurring in various settings including at church, her girlfriend's house, and her own home, totaling five incidents in less than a week. She describes these episodes as sudden, with no preceding symptoms like sweating, which she used to experience before her previous surgery for a brain tumor. During these episodes, she appears to stop breathing and has a very low pulse, but recovers quickly and returns to normal behavior shortly after regaining consciousness.  She has a history of seizures and a brain tumor, for which she had undergone surgery. Her neurologist has indicated that she still experiences seizures, though they are described as "weird." Concerns about these episodes possibly being seizures have been raised again due to their recent increase in frequency and intensity.  She expresses significant concern about her blood pressure, which she monitors at home. It was reportedly extremely high in the morning at 298 over 120, but it decreases after her second dose of medication. She is considering adjustments to her medication timing and the reintroduction of chlorthalidone.  Additionally, Bethany Wang reports discomfort and pain in her lower back, suspecting a problem with her kidney or bladder. Although a recent urine analysis by Dr. Roger Shelter showed no signs of infection, she plans to undergo further testing and has an appointment with a urologist, Dr. Melina Fiddler, scheduled for next week.  Bethany Wang is also dealing with significant personal stress due to her brother's recent diagnosis of bone cancer, which has been emotionally taxing as she accompanies him to his treatments.   Previous antihypertensives: Chlorthalidone spironolactone  Past Medical History:  Diagnosis Date   Anemia    Anxiety     Bilateral breast cysts 12/30/2015   Brain tumor (HCC)    Breast cancer (HCC)    Left Breast Cancer   Breast disorder    BV (bacterial vaginosis) 09/09/2015   Coronary atherosclerosis of native coronary artery    a. s/p DES to RCA in 2013 b. DES to LAD in 2014 c. cath in 04/2016 showing patent LAD stent with D2 jailed by LAD stent and CTO of RCA with left to right collaterals present   Cyst of pharynx or nasopharynx    [name]   Decreased libido 12/25/2018   Depression    Diabetes mellitus without complication (HCC)    Essential hypertension    Falls    x 3-4 in past year   GERD (gastroesophageal reflux disease)    Headache    Heart attack (HCC)    x4   History of hematuria    Mixed hyperlipidemia    Personal history of chemotherapy    Left Breast Cancer   Personal history of radiation therapy    Left Breast Cancer   Pre-diabetes    PUD (peptic ulcer disease)    Seizures (HCC)    Shingles 09/09/2015   Stroke (HCC)    12-2017 on Plavix, only deficit is headaches   Suicide attempt (HCC)    3 attempts in remote past   Trichimoniasis 08/20/2019   Treated 08/20/19    Uterine cancer (HCC)    Vitamin D deficiency  disease 04/03/2019    Past Surgical History:  Procedure Laterality Date   ABDOMINAL HYSTERECTOMY     BIOPSY  11/28/2019   Procedure: BIOPSY;  Surgeon: Malissa Hippo, MD;  [facility];  Service: Endoscopy;;  antral, gastric body    BLADDER SURGERY     BREAST LUMPECTOMY Left    BREAST LUMPECTOMY Right 02/24/2018   Procedure: RIGHT BREAST LUMPECTOMY ERAS PATHWAY;  Surgeon: Griselda Miner, MD;  Location: [facility] OR;  Service: General;  Laterality: Right;   CARDIAC CATHETERIZATION N/A 05/10/2016   Procedure: Left Heart Cath and Coronary Angiography;  Surgeon: Lyn Records, MD;  Location: [facility] INVASIVE CV LAB;  Service: Cardiovascular;  Laterality: N/A;   CAUTERIZE INNER NOSE  07/13/2020   COLONOSCOPY  May 2010   Fleishman: normal rectum,  internal hemorrhoids, , benign colonic polyp   COLONOSCOPY N/A 01/05/2017   Procedure: COLONOSCOPY;  Surgeon: Malissa Hippo, MD;  Location: [facility];  Service: Endoscopy;  Laterality: N/A;  1:00   CORONARY PRESSURE/FFR STUDY N/A 10/23/2020   Procedure: INTRAVASCULAR PRESSURE WIRE/FFR STUDY;  Surgeon: Lyn Records, MD;  Location: [facility] INVASIVE CV LAB;  Service: Cardiovascular;  Laterality: N/A;   CORONARY PRESSURE/FFR STUDY N/A 03/30/2021   Procedure: INTRAVASCULAR PRESSURE WIRE/FFR STUDY;  Surgeon: Yvonne Kendall, MD;  Location: [facility] INVASIVE CV LAB;  Service: Cardiovascular;  Laterality: N/A;   ESOPHAGEAL DILATION  11/28/2019   Procedure: ESOPHAGEAL DILATION;  Surgeon: Malissa Hippo, MD;  [facility]: [facility];  Service: Endoscopy;;   ESOPHAGOGASTRODUODENOSCOPY     2001 Dr. Madilyn Fireman: distal esophagitis, small hiatal hernia,. Dr. Katrinka Blazing 2006? no records available currently, pt also reports  EGD a few years ago with Dr. Jena Gauss, do not have these reports anywhere in medical records   ESOPHAGOGASTRODUODENOSCOPY  06/02/2011   ONG:EXBMWU pill impaction as described above s/p dilation of a probable cervical esophageal web/bx abnormal esophageal and gastric mucosa. + H.pylori gastritis    ESOPHAGOGASTRODUODENOSCOPY (EGD) WITH PROPOFOL N/A 11/28/2019   Procedure: ESOPHAGOGASTRODUODENOSCOPY (EGD) WITH PROPOFOL;  Surgeon: Malissa Hippo, MD;  [facility];  Service: Endoscopy;  Laterality: N/A;  1020   EYE SURGERY     Removed glass   HAND SURGERY Right    Left breast lumpectomy     Benign   LEFT HEART CATH AND CORONARY ANGIOGRAPHY N/A 10/25/2018   Procedure: LEFT HEART CATH AND CORONARY ANGIOGRAPHY;  Surgeon: Iran Ouch, MD;  Location: [facility] INVAS[facility] CV LAB;  Service: Cardiovascular;  Laterality: N/A;   LEFT HEART CATH AND CORONARY ANGIOGRAPHY N/A 10/23/2020   Procedure: LEFT HEART CATH AND CORONARY ANGIOGRAPHY;  Surgeon: Lyn Records, MD;  Location: [facility] INVASIVE CV LAB;  Service:  Cardiovascular;  Laterality: N/A;   LEFT HEART CATH AND CORONARY ANGIOGRAPHY N/A 03/30/2021   Procedure: LEFT HEART CATH AND CORONARY ANGIOGRAPHY;  Surgeon: Yvonne Kendall, MD;  Location: [facility] INVASIVE CV LAB;  Service: Cardiovascular;  Laterality: N/A;   LEFT HEART CATH AND CORONARY ANGIOGRAPHY N/A 01/22/2022   Procedure: LEFT HEART CATH AND CORONARY ANGIOGRAPHY;  Surgeon: Tonny Bollman, MD;  Location: [facility] INVASIVE CV LAB;  Service: Cardiovascular;  Laterality: N/A;   LEFT HEART CATHETERIZATION WITH CORONARY ANGIOGRAM N/A 07/03/2014   Procedure: LEFT HEART CATHETERIZATION WITH CORONARY ANGIOGRAM;  Surgeon: Lyn Records, MD;  Location: [facility] CATH LAB;  Service: Cardiovascular;  Laterality: N/A;   POLYPECTOMY  01/05/2017   Procedure: POLYPECTOMY;  Surgeon: Malissa Hippo, MD;  [facility];  Service:  Endoscopy;;  colon   SHOULDER SURGERY Left     Current Medications: Current Meds  Medication Sig   albuterol (VENTOLIN HFA) 108 (90 Base) MCG/ACT inhaler Inhale 2 puffs into the lungs every 4 (four) hours as needed for wheezing or shortness of breath.   ALPRAZolam (XANAX) 0.5 MG tablet Take 1 tablet (0.5 mg total) by mouth 2 (two) times daily as needed for anxiety.   aspirin EC 81 MG tablet Take 1 tablet (81 mg total) by mouth daily.   blood glucose meter kit and supplies KIT Use to check blood sugar daily.   carvedilol (COREG) 25 MG tablet TAKE (1) TABLET TWICE DAILY.   chlorthalidone (HYGROTON) 25 MG tablet Take 1 tablet (25 mg total) by mouth daily.   Cholecalciferol (DIALYVITE VITAMIN D 5000) 125 MCG (5000 UT) capsule Take 5,000 Units by mouth daily.   clopidogrel (PLAVIX) 75 MG tablet Take 75 mg by mouth daily.   diphenhydrAMINE (BENADRYL) 25 MG tablet Take 25 mg by mouth every 6 (six) hours as needed for allergies.   doxazosin (CARDURA) 2 MG tablet Take 1 tablet (2 mg total) by mouth at bedtime.   escitalopram (LEXAPRO) 20 MG tablet Take 1 tablet (20 mg total) by mouth daily.    Evolocumab (REPATHA SURECLICK Hiller) Inject 140 mg into the skin every 14 (fourteen) days.   fluorometholone (FML) 0.1 % ophthalmic suspension daily.   fluticasone-salmeterol (ADVAIR) 100-50 MCG/ACT AEPB INHALE 1 PUFF BY MOUTH TWICE DAILY   isosorbide mononitrate (IMDUR) 60 MG 24 hr tablet Take 1 tablet (60 mg total) by mouth 2 (two) times daily.   linaclotide (LINZESS) 290 MCG CAPS capsule Take 1 capsule (290 mcg total) by mouth daily before breakfast.   nitroGLYCERIN (NITROSTAT) 0.4 MG SL tablet DISSOLVE 1 TABLET UNDER THE TONGUE AS NEEDED FOR CHEST PAIN EVERY 5 MINUTES UP TO 3 TIMES. IF NO RELIEF CALL 911.   potassium chloride SA (KLOR-CON M) 20 MEQ tablet Take 1 tablet (20 mEq total) by mouth 2 (two) times daily.   RESTASIS 0.05 % ophthalmic emulsion Place 1 drop into both eyes 2 (two) times daily.   traZODone (DESYREL) 50 MG tablet Take 1-2 tablets by mouth at bedtime.   [DISCONTINUED] amLODipine (NORVASC) 10 MG tablet TAKE 1 TABLET ONCE DAILY.   Current Facility-Administered Medications for the 08/10/22 encounter (Office Visit) with Chilton Si, MD  Medication   NONFORMULARY OR COMPOUNDED ITEM 0.2 mL     Allergies:   Bee venom, Lipitor [atorvastatin], Penicillins, Shellfish allergy, Spironolactone, Tomato, Valsartan, Zofran [ondansetron], Crestor [rosuvastatin], and Ranolazine   Social History   Socioeconomic History   Marital status: Married    Spouse name: Luisa Hart   Number of children: 3   Years of education: Assoc   Highest education level: Not on file  Occupational History   Occupation: Retired    Comment: disability  Tobacco Use   Smoking status: Former    Packs/day: 0.20    Years: 32.00    Additional pack years: 0.00    Total pack years: 6.40    Types: Cigarettes, Cigars    Start date: 07/05/1983    Quit date: 04/03/2012    Years since quitting: 10.3   Smokeless tobacco: Never  Vaping Use   Vaping Use: Never used  Substance and Sexual Activity   Alcohol use: No     Alcohol/week: 0.0 standard drinks of alcohol   Drug use: No   Sexual activity: Yes    Birth control/protection: Surgical  Comment: hyst  Other Topics Concern   Not on file  Social History Narrative   Patient lives at new home with husband and extended family..On disability since age 7 yrs.   Social Determinants of Health   Financial Resource Strain: Medium Risk (01/13/2021)   Overall Financial Resource Strain (CARDIA)    Difficulty of Paying Living Expenses: Somewhat hard  Food Insecurity: No Food Insecurity (01/13/2021)   Hunger Vital Sign    Worried About Running Out of Food in the Last Year: Never true    Ran Out of Food in the Last Year: Never true  Transportation Needs: No Transportation Needs (01/13/2021)   PRAPARE - Administrator, Civil Service (Medical): No    Lack of Transportation (Non-Medical): No  Physical Activity: Sufficiently Active (01/13/2021)   Exercise Vital Sign    Days of Exercise per Week: 7 days    Minutes of Exercise per Session: 60 min  Stress: Stress Concern Present (01/13/2021)   [employer] of Occupational Health - Occupational Stress Questionnaire    Feeling of Stress : To some extent  Social Connections: Moderately Isolated (01/13/2021)   Social Connection and Isolation Panel [NHANES]    Frequency of Communication with Friends and Family: Never    Frequency of Social Gatherings with Friends and Family: Never    Attends Religious Services: More than 4 times per year    Active Member of [employer] or Organizations: No    Attends Banker Meetings: Never    Marital Status: Married     Family History: The patient's family history includes ADD / ADHD in her daughter, son, and son; Alzheimer's disease in her paternal aunt; Aneurysm in her mother; Bipolar disorder in her daughter, son, and son; Cancer in her father, maternal uncle, and paternal aunt; Cirrhosis in her maternal uncle; Colon cancer in her paternal grandfather;  Heart attack in her brother; Heart disease in her brother; Mental illness in her daughter, son, and son.  ROS:   Please see the history of present illness.    (+) Headaches (+) Chest pain (+) Palpitations (+) Lightheadedness (+) Insomnia All other systems reviewed and are negative.  EKGs/Labs/Other Studies Reviewed:    LHC 01/22/22: Conclusion Final conclusion: Stable multivessel coronary artery disease with chronic total occlusion of the proximal RCA collateralized from the left coronary artery, mild to moderate in-stent restenosis in the LAD (previous flow wire negative), moderately severe OM stenosis and a small OM branch (negative flow wire in past)   No change in the patient's coronary anatomy over multiple studies.  FloWire analysis has been done demonstrating no flow obstructive disease on past studies.  Recommend continued medical therapy.  Likely needs escalation of antihypertensive therapy as I suspect hypertension (supply/demand mismatch) is driving her symptoms.     Diagnostic Dominance: Co-dominant        Monitor 11/03/2021: Patch Wear Time:  3 days and 9 hours (2023-07-14T13:09:22-0400 to 2023-07-17T22:28:44-0400)   Patient had a min HR of 45 bpm, max HR of 167 bpm, and avg HR of 69 bpm. Predominant underlying rhythm was Sinus Rhythm. 1 run of Ventricular Tachycardia occurred lasting 4 beats with a max rate of 156 bpm (avg 142 bpm). 5 Supraventricular Tachycardia  runs occurred, the run with the fastest interval lasting 8 beats with a max rate of 167 bpm, the longest lasting 18 beats with an avg rate of 139 bpm. Isolated SVEs were rare (<1.0%), SVE Couplets were rare (<1.0%), and SVE Triplets were rare (<  1.0%).  Isolated VEs were occasional (1.1%, 3539), VE Couplets were rare (<1.0%, 12), and no VE Triplets were present. Ventricular Bigeminy and Trigeminy were present. Inverted QRS complexes possibly due to inverted placement of device.   Monitor shows a short 4 beat run  of NSVT. Several short PSVT runs were noted. Infrequent PVC's and PAC's. No afib.   Carotid 10/28/21: Summary:  Right Carotid: The extracranial vessels were near-normal with only minimal  wall  thickening or plaque.   Left Carotid: The extracranial vessels were near-normal with only minimal  wall  thickening or plaque.   Vertebrals:  Bilateral vertebral arteries demonstrate antegrade flow.  Subclavians: Normal flow hemodynamics were seen in bilateral subclavian  arteries.     EKG:  EKG is personally reviewed. 05/31/2022:  EKG was not ordered.  Recent Labs: 02/16/2022: Magnesium 3.2 07/15/2022: ALT 17; BUN 8; Creatinine, Ser 0.83; Hemoglobin 13.5; Platelets 243; Potassium 3.8; Sodium 138; TSH 4.050   Recent Lipid Panel    Component Value Date/Time   CHOL 153 01/20/2022 1446   TRIG 85 01/20/2022 1446   HDL 74 01/20/2022 1446   CHOLHDL 2.1 01/20/2022 1446   VLDL 17 01/20/2022 1446   LDLCALC 62 01/20/2022 1446   LDLCALC 146 (H) 07/03/2020 1334    Physical Exam:    VS:  BP (!) 185/96 (BP Location: Left Arm, Patient Position: Sitting, Cuff Size: Normal)   Pulse 79   Ht 5\' 4"  (1.626 m)   Wt 154 lb 8 oz (70.1 kg)   SpO2 95%   BMI 26.52 kg/m  , BMI Body mass index is 26.52 kg/m. GENERAL:  Well appearing HEENT: Pupils equal round and reactive, fundi not visualized, oral mucosa unremarkable NECK:  No jugular venous distention, waveform within normal limits, carotid upstroke brisk and symmetric, no bruits, no thyromegaly LUNGS:  Clear to auscultation bilaterally HEART:  RRR.  PMI not displaced or sustained,S1 and S2 within normal limits, no S3, no S4, no clicks, no rubs, no murmurs ABD:  Flat, positive bowel sounds normal in frequency in pitch, no bruits, no rebound, no guarding, no midline pulsatile mass, no hepatomegaly, no splenomegaly EXT:  2 plus pulses throughout, no edema, no cyanosis, no clubbing SKIN:  No rashes, no nodules NEURO:  Cranial nerves II through XII grossly  intact, motor grossly intact throughout PSYCH:  Cognitively intact, oriented to person place and time   ASSESSMENT/PLAN:    No problem-specific Assessment & Plan notes found for this encounter.  # Syncope vs, seizure: - Patient reports multiple episodes of passing out in various settings without any prodromal symptoms. History of seizures and a previous brain tumor. - Plan: Refer the patient to a neurologist for further evaluation to rule out seizure recurrence or other neurological causes. - Check echo.  14 day live Zio monitor for arrhythmia detection.  Recommend that she not drive until this is resolved.  She is not orthostatic in the office.   # Resistant Hypertension: - Patient reports high blood pressure readings at home, with a reading of 298/120 this morning. Currently on Amlodipine and Carvedilol.  She will start taking amlodipine in the evenings.  Start chlorthalidone 25mg  daily and potassium 20 mEq daily.  We helped her get logged back into the [employer] system.  Continue to track BP twice daily.   # Dysuria, back pain: Check U/A   Screening for Secondary Hypertension:     01/29/2022   12:23 PM  Causes  Renovascular HTN Screened     -  Comments 01/2022 renal duplex ordered  Sleep Apnea Screened     - Comments 01/2022 in lab sleep study  Thyroid Disease Screened     - Comments 10/2021 hypothyroidism per PCP  Hyperaldosteronism Screened     - Comments 01/2022 renin-aldosterone ordered  Pheochromocytoma Screened     - Comments prior MRI abdomen normal kidney no tumor noted  Cushing's Syndrome Screened     - Comments 01/2022 cortisol  Coarctation of the Aorta Screened     - Comments 2023 carotid duplex no stenosis  Compliance Screened    Relevant Labs/Studies:    Latest Ref Rng & Units 07/15/2022   12:13 PM 06/14/2022   10:00 AM 04/25/2022    6:35 PM  Basic Labs  Sodium 135 - 145 mmol/L 138  CANCELED  141   Potassium 3.5 - 5.1 mmol/L 3.8  CANCELED  3.3   Creatinine 0.44  - 1.00 mg/dL 1.61  CANCELED  0.96        Latest Ref Rng & Units 07/15/2022   12:13 PM 07/03/2020    1:34 PM  Thyroid   TSH 0.350 - 4.500 uIU/mL 4.050  1.84        Latest Ref Rng & Units 02/16/2022    8:00 AM  Renin/Aldosterone   Aldosterone 0.0 - 30.0 ng/dL 7.1   Aldos/Renin Ratio 0.0 - 30.0 27.3        Latest Ref Rng & Units 02/16/2022    8:00 AM  Metanephrines/Catecholamines   Epinephrine 0 - 62 pg/mL 39   Norepinephrine 0 - 874 pg/mL 620   Dopamine 0 - 48 pg/mL <30   Metanephrines 0.0 - 88.0 pg/mL <25.0   Normetanephrines  0.0 - 244.0 pg/mL 53.2           07/01/2022    9:27 AM  Renovascular   Renal Artery [place] Completed Yes    Disposition:      FU with Travarius Lange C. Duke Salvia, MD, [facility] in 1 month.  Medication Adjustments/Labs and Tests Ordered: Current medicines are reviewed at length with the patient today.  Concerns regarding medicines are outlined above.   Orders Placed This Encounter  Procedures   Urinalysis   Basic metabolic panel   ECHOCARDIOGRAM COMPLETE   Meds ordered this encounter  Medications   chlorthalidone (HYGROTON) 25 MG tablet    Sig: Take 1 tablet (25 mg total) by mouth daily.    Dispense:  90 tablet    Refill:  3   potassium chloride SA (KLOR-CON M) 20 MEQ tablet    Sig: Take 1 tablet (20 mEq total) by mouth 2 (two) times daily.    Dispense:  90 tablet    Refill:  1   amLODipine (NORVASC) 10 MG tablet    Sig: Take 1 tablet (10 mg total) by mouth every evening.    Dispense:  90 tablet    Refill:  3    TO BE PUT ON FILE, PATIENT WILL CALL WHEN NEEDS FILLED      Signed, Chilton Si, MD  08/10/2022 5:17 PM    [facility]

## 2022-08-10 NOTE — Patient Instructions (Signed)
Medication Instructions:  START CHLORTHALIDONE 25 MG DAILY   START POTASSIUM 20 MEQ DAILY   START TAKING AMLODIPINE IN THE EVENING   Labwork: URINALYSIS TODAY   BMET IN 1 WEEK   Testing/Procedures: 24 HOUR HOME BLOOD PRESSURE MONITOR  Your physician has requested that you have an echocardiogram. Echocardiography is a painless test that uses sound waves to create images of your heart. It provides your doctor with information about the size and shape of your heart and how well your heart's chambers and valves are working. This procedure takes approximately one hour. There are no restrictions for this procedure. Please do NOT wear cologne, perfume, aftershave, or lotions (deodorant is allowed). Please arrive 15 minutes prior to your appointment time.   14 DAY ZIO MONITOR   Follow-Up: 6 WEEKS WITH PHARM D OR CAITLIN W NP IN [facility]N CLINIC  Any Other Special Instructions Will Be Listed Below (If Applicable). BRING YOUR VIVIFY MACHINE BACK IN 1 WEEK WHEN YOU RETURN FOR LABS   ZIO AT Long term monitor-Live Telemetry  Your physician has requested you wear a ZIO patch monitor for 14 days.  This is a single patch monitor. [employer] supplies one patch monitor per enrollment. Additional  stickers are not available.  Please do not apply patch if you will be having a Nuclear Stress Test, Echocardiogram, Cardiac CT, MRI,  or Chest Xray during the period you would be wearing the monitor. The patch cannot be worn during  these tests. You cannot remove and re-apply the ZIO AT patch monitor.  Your ZIO patch monitor will be mailed 3 day USPS to your address on file. It may take 3-5 days to  receive your monitor after you have been enrolled.  Once you have received your monitor, please review the enclosed instructions. Your monitor has  already been registered assigning a specific monitor serial # to you.   Billing and Patient Assistance Program information  Meredeth Ide has been supplied with any  insurance information on record for billing. [employer] offers a sliding scale Patient Assistance Program for patients without insurance, or whose  insurance does not completely cover the cost of the ZIO patch monitor. You must apply for the  Patient Assistance Program to qualify for the discounted rate. To apply, call [employer] at [phone],  select option 4, select option 2 , ask to apply for the Patient Assistance Program, (you can request an  interpreter if needed). [employer] will ask your household income and how many people are in your  household. [employer] will quote your out-of-pocket cost based on this information. They will also be able  to set up a 12 month interest free payment plan if needed.  Applying the monitor   Shave hair from upper left chest.  Hold the abrader disc by orange tab. Rub the abrader in 40 strokes over left upper chest as indicated in  your monitor instructions.  Clean area with 4 enclosed alcohol pads. Use all pads to ensure the area is cleaned thoroughly. Let  dry.  Apply patch as indicated in monitor instructions. Patch will be placed under collarbone on left side of  chest with arrow pointing upward.  Rub patch adhesive wings for 2 minutes. Remove the white label marked "1". Remove the white label  marked "2". Rub patch adhesive wings for 2 additional minutes.  While looking in a mirror, press and release button in center of patch. A small green light will flash 3-4  times. This will be your only indicator that  the monitor has been turned on.  Do not shower for the first 24 hours. You may shower after the first 24 hours.  Press the button if you feel a symptom. You will hear a small click. Record Date, Time and Symptom in  the Patient Log.   Starting the Gateway  In your kit there is a Audiological scientist box the size of a cellphone. This is Buyer, retail. It transmits all your  recorded data to [facility]. This box must always stay within 10 feet of you. Open the box  and push the *  button. There will be a light that blinks orange and then green a few times. When the light stops  blinking, the Gateway is connected to the ZIO patch. Call [employer] at [phone] to confirm your monitor is transmitting.  Returning your monitor  Remove your patch and place it inside the Gateway. In the lower half of the Gateway there is a white  bag with prepaid postage on it. Place Gateway in bag and seal. Mail package back to [place] as soon as  possible. Your physician should have your final report approximately 7 days after you have mailed back  your monitor. Call [facility] Customer Care at [phone] if you have questions regarding your ZIO AT  patch monitor. Call them immediately if you see an orange light blinking on your monitor.  If your monitor falls off in less than 4 days, contact our Monitor department at [phone]. If your  monitor becomes loose or falls off after 4 days call [employer] at [phone] for suggestions on  securing your monitor

## 2022-08-10 NOTE — Progress Notes (Unsigned)
Enrolled for [employer] to mail a ZIO AT Live Telemetry monitor to patients address on file.  

## 2022-08-10 NOTE — Research (Signed)
I saw pt today after Dr. Idamay's follow up visit. Pt is in Dr. Douds's Virtual Care HTN Study. Pt filled out research survey. Pt is enrolled in Group 2. 

## 2022-08-10 NOTE — Addendum Note (Signed)
Addended by: Chilton Si C on: 08/10/2022 06:30 PM   Modules accepted: Orders

## 2022-08-11 ENCOUNTER — Ambulatory Visit: Payer: 59 | Attending: Cardiovascular Disease

## 2022-08-11 ENCOUNTER — Ambulatory Visit (INDEPENDENT_AMBULATORY_CARE_PROVIDER_SITE_OTHER): Payer: 59

## 2022-08-11 DIAGNOSIS — Z5181 Encounter for therapeutic drug level monitoring: Secondary | ICD-10-CM | POA: Diagnosis not present

## 2022-08-11 DIAGNOSIS — R030 Elevated blood-pressure reading, without diagnosis of hypertension: Secondary | ICD-10-CM

## 2022-08-11 DIAGNOSIS — I1A0 Resistant hypertension: Secondary | ICD-10-CM

## 2022-08-11 DIAGNOSIS — R55 Syncope and collapse: Secondary | ICD-10-CM | POA: Diagnosis not present

## 2022-08-11 NOTE — Progress Notes (Unsigned)
24 hour ambulatory blood pressure monitor applied to patients left arm using standard adult cuff. 

## 2022-08-11 NOTE — Progress Notes (Unsigned)
ZIO AT serial # [id number] from office inventory applied to patient.

## 2022-08-12 DIAGNOSIS — R55 Syncope and collapse: Secondary | ICD-10-CM | POA: Diagnosis not present

## 2022-08-16 ENCOUNTER — Telehealth: Payer: Self-pay | Admitting: Cardiovascular Disease

## 2022-08-16 NOTE — Telephone Encounter (Signed)
Pt would like a callback to find out when she should take her Zio monitor off. She stated she has a paper telling her to keep it on for 14 days but then another saying she should complete by 08/17/2022. Pt is very confused and she also has a ENDOSCOPY scheduled for 08/18/2022 morning at 7 am so she doesn't know if she's supposed to keep this on her or just take it. Please advise.

## 2022-08-16 NOTE — Telephone Encounter (Signed)
Returned call to patient, advised patient to wear full 14 days unless they advise her to remove for the surgery, then she could mail it back early.

## 2022-08-18 ENCOUNTER — Encounter (HOSPITAL_COMMUNITY): Payer: Self-pay | Admitting: Gastroenterology

## 2022-08-18 ENCOUNTER — Ambulatory Visit (HOSPITAL_COMMUNITY): Payer: 59 | Admitting: Anesthesiology

## 2022-08-18 ENCOUNTER — Encounter (HOSPITAL_COMMUNITY)
Admission: RE | Disposition: A | Discharge status: Home / Self Care | Payer: Self-pay | Source: Home / Self Care | Attending: Gastroenterology

## 2022-08-18 ENCOUNTER — Other Ambulatory Visit: Payer: Self-pay

## 2022-08-18 ENCOUNTER — Ambulatory Visit (HOSPITAL_BASED_OUTPATIENT_CLINIC_OR_DEPARTMENT_OTHER): Payer: 59 | Admitting: Anesthesiology

## 2022-08-18 ENCOUNTER — Ambulatory Visit (HOSPITAL_COMMUNITY)
Admission: RE | Admit: 2022-08-18 | Discharge: 2022-08-18 | Disposition: A | Discharge status: Home / Self Care | Payer: 59 | Attending: Gastroenterology | Admitting: Gastroenterology

## 2022-08-18 DIAGNOSIS — Z87891 Personal history of nicotine dependence: Secondary | ICD-10-CM | POA: Diagnosis not present

## 2022-08-18 DIAGNOSIS — K3189 Other diseases of stomach and duodenum: Secondary | ICD-10-CM | POA: Insufficient documentation

## 2022-08-18 DIAGNOSIS — K2950 Unspecified chronic gastritis without bleeding: Secondary | ICD-10-CM

## 2022-08-18 DIAGNOSIS — K2270 Barrett's esophagus without dysplasia: Secondary | ICD-10-CM | POA: Insufficient documentation

## 2022-08-18 DIAGNOSIS — F418 Other specified anxiety disorders: Secondary | ICD-10-CM | POA: Insufficient documentation

## 2022-08-18 DIAGNOSIS — I10 Essential (primary) hypertension: Secondary | ICD-10-CM | POA: Insufficient documentation

## 2022-08-18 DIAGNOSIS — I25119 Atherosclerotic heart disease of native coronary artery with unspecified angina pectoris: Secondary | ICD-10-CM | POA: Diagnosis not present

## 2022-08-18 DIAGNOSIS — Z8711 Personal history of peptic ulcer disease: Secondary | ICD-10-CM | POA: Diagnosis not present

## 2022-08-18 DIAGNOSIS — K2289 Other specified disease of esophagus: Secondary | ICD-10-CM | POA: Diagnosis not present

## 2022-08-18 DIAGNOSIS — Z9221 Personal history of antineoplastic chemotherapy: Secondary | ICD-10-CM | POA: Diagnosis not present

## 2022-08-18 DIAGNOSIS — Z923 Personal history of irradiation: Secondary | ICD-10-CM | POA: Diagnosis not present

## 2022-08-18 DIAGNOSIS — K31A11 Gastric intestinal metaplasia without dysplasia, involving the antrum: Secondary | ICD-10-CM | POA: Insufficient documentation

## 2022-08-18 DIAGNOSIS — Z8673 Personal history of transient ischemic attack (TIA), and cerebral infarction without residual deficits: Secondary | ICD-10-CM | POA: Insufficient documentation

## 2022-08-18 DIAGNOSIS — E782 Mixed hyperlipidemia: Secondary | ICD-10-CM | POA: Insufficient documentation

## 2022-08-18 DIAGNOSIS — I252 Old myocardial infarction: Secondary | ICD-10-CM | POA: Diagnosis not present

## 2022-08-18 DIAGNOSIS — Z853 Personal history of malignant neoplasm of breast: Secondary | ICD-10-CM | POA: Insufficient documentation

## 2022-08-18 DIAGNOSIS — K219 Gastro-esophageal reflux disease without esophagitis: Secondary | ICD-10-CM | POA: Insufficient documentation

## 2022-08-18 DIAGNOSIS — E119 Type 2 diabetes mellitus without complications: Secondary | ICD-10-CM | POA: Diagnosis not present

## 2022-08-18 DIAGNOSIS — D649 Anemia, unspecified: Secondary | ICD-10-CM | POA: Diagnosis not present

## 2022-08-18 DIAGNOSIS — I2510 Atherosclerotic heart disease of native coronary artery without angina pectoris: Secondary | ICD-10-CM | POA: Diagnosis not present

## 2022-08-18 DIAGNOSIS — D759 Disease of blood and blood-forming organs, unspecified: Secondary | ICD-10-CM | POA: Insufficient documentation

## 2022-08-18 DIAGNOSIS — R1013 Epigastric pain: Secondary | ICD-10-CM | POA: Diagnosis present

## 2022-08-18 HISTORY — PX: ESOPHAGOGASTRODUODENOSCOPY (EGD) WITH PROPOFOL: SHX5813

## 2022-08-18 HISTORY — PX: BIOPSY: SHX5522

## 2022-08-18 LAB — GLUCOSE, CAPILLARY: Glucose-Capillary: 107 mg/dL — ABNORMAL HIGH (ref 70–99)

## 2022-08-18 SURGERY — ESOPHAGOGASTRODUODENOSCOPY (EGD) WITH PROPOFOL
Anesthesia: General

## 2022-08-18 MED ORDER — PROPOFOL 500 MG/50ML IV EMUL
INTRAVENOUS | Status: AC
  Filled 2022-08-18: qty 50

## 2022-08-18 MED ORDER — PROPOFOL 10 MG/ML IV BOLUS
INTRAVENOUS | Status: DC | PRN
  Administered 2022-08-18: 60 mg via INTRAVENOUS

## 2022-08-18 MED ORDER — PROPOFOL 500 MG/50ML IV EMUL
INTRAVENOUS | Status: DC | PRN
  Administered 2022-08-18: 125 ug/kg/min via INTRAVENOUS

## 2022-08-18 MED ORDER — LIDOCAINE HCL (PF) 2 % IJ SOLN
INTRAMUSCULAR | Status: AC
  Filled 2022-08-18: qty 5

## 2022-08-18 MED ORDER — LIDOCAINE HCL (PF) 2 % IJ SOLN: INTRAMUSCULAR | Status: DC | PRN

## 2022-08-18 MED ORDER — LIDOCAINE HCL (PF) 2 % IJ SOLN
INTRAMUSCULAR | Status: DC | PRN
  Administered 2022-08-18: 80 mg

## 2022-08-18 MED ORDER — LACTATED RINGERS IV SOLN
INTRAVENOUS | Status: DC
  Administered 2022-08-18: 1000 mL via INTRAVENOUS

## 2022-08-18 NOTE — Discharge Instructions (Addendum)
You are being discharged to home.  Resume your previous diet.  We are waiting for your pathology results.  

## 2022-08-18 NOTE — Anesthesia Preprocedure Evaluation (Signed)
Anesthesia Evaluation  Patient identified by MRN, date of birth, ID band Patient awake    Reviewed: Allergy & Precautions, H&P , NPO status , Patient's Chart, lab work & pertinent test results, reviewed documented beta blocker date and time   Airway Mallampati: II  TM Distance: >3 FB Neck ROM: full    Dental no notable dental hx.    Pulmonary neg pulmonary ROS, former smoker   Pulmonary exam normal breath sounds clear to auscultation       Cardiovascular Exercise Tolerance: Good hypertension, + angina  + CAD and + Past MI  negative cardio ROS  Rhythm:regular Rate:Normal     Neuro/Psych  Headaches, Seizures -,  PSYCHIATRIC DISORDERS Anxiety Depression     Neuromuscular disease CVA negative neurological ROS  negative psych ROS   GI/Hepatic negative GI ROS, Neg liver ROS, PUD,GERD  ,,  Endo/Other  negative endocrine ROSdiabetes    Renal/GU negative Renal ROS  negative genitourinary   Musculoskeletal   Abdominal   Peds  Hematology negative hematology ROS (+) Blood dyscrasia, anemia   Anesthesia Other Findings   Reproductive/Obstetrics negative OB ROS                             Anesthesia Physical Anesthesia Plan  ASA: 3  Anesthesia Plan: General   Post-op Pain Management:    Induction:   PONV Risk Score and Plan: Propofol infusion  Airway Management Planned:   Additional Equipment:   Intra-op Plan:   Post-operative Plan:   Informed Consent: I have reviewed the patients History and Physical, chart, labs and discussed the procedure including the risks, benefits and alternatives for the proposed anesthesia with the patient or authorized representative who has indicated his/her understanding and acceptance.     Dental Advisory Given  Plan Discussed with: CRNA  Anesthesia Plan Comments:        Anesthesia Quick Evaluation

## 2022-08-18 NOTE — Op Note (Signed)
[facility] Patient Name: Bethany Wang Procedure Date: 08/18/2022 8:45 AM MRN: [id number] Date of Birth: July 27, 1965 Attending MD: Katrinka Blazing , , [id number] CSN: [id number] Age: 57 Admit Type: Outpatient Procedure:                Upper GI endoscopy Indications:              Epigastric abdominal pain Providers:                Katrinka Blazing, Tiburcio Pea                            Technician, Technician Referring MD:              Medicines:                Monitored Anesthesia Care Complications:            No immediate complications. Estimated Blood Loss:     Estimated blood loss: none. Procedure:                Pre-Anesthesia Assessment:                           - Prior to the procedure, a History and Physical                            was performed, and patient medications, allergies                            and sensitivities were reviewed. The patient's                            tolerance of previous anesthesia was reviewed.                           - The risks and benefits of the procedure and the                            sedation options and risks were discussed with the                            patient. All questions were answered and informed                            consent was obtained.                           - ASA Grade Assessment: III - A patient with severe                            systemic disease.                           After obtaining informed consent, the endoscope was                            passed under direct vision. Throughout the  procedure, the patient's blood pressure, pulse, and                            oxygen saturations were monitored continuously. The                            [id number]-H190 ([id number]) scope was introduced through the                            mouth, and advanced to the second part of duodenum.                            The upper GI endoscopy was accomplished  without                            difficulty. The patient tolerated the procedure                            well. Scope In: 9:06:34 AM Scope Out: 9:11:57 AM Total Procedure Duration: 0 hours 5 minutes 23 seconds  Findings:      One tongue of salmon-colored mucosa was present from 40 to 41 cm. No       other visible abnormalities were present. The maximum longitudinal       extent of these esophageal mucosal changes was 1 cm in length. Biopsies       were taken with a cold forceps for histology.      The gastroesophageal flap valve was visualized endoscopically and       classified as Hill Grade II (fold present, opens with respiration).      Multiple localized small erosions with no stigmata of recent bleeding       were found in the gastric antrum. Biopsies were taken with a cold       forceps for Helicobacter pylori testing.      The examined duodenum was normal. Impression:               - Salmon-colored mucosa. Biopsied.                           - Erosive gastropathy with no stigmata of recent                            bleeding. Biopsied.                           - Normal examined duodenum. Moderate Sedation:      Per Anesthesia Care Recommendation:           - Discharge patient to home (ambulatory).                           - Resume previous diet.                           - Await pathology results.                           - If negative  biopsies, proceed with H. pylori                            stool testing (kit to be collected at the GI office)                           - Will start PPI once biopsies are back. Procedure Code(s):        --- Professional ---                           [phone], Esophagogastroduodenoscopy, flexible,                            transoral; with biopsy, single or multiple Diagnosis Code(s):        --- Professional ---                           K22.89, Other specified disease of esophagus                           K31.89, Other diseases of  stomach and duodenum                           R10.13, Epigastric pain CPT copyright 2022 [employer]. All rights reserved. The codes documented in this report are preliminary and upon coder review may  be revised to meet current compliance requirements. Katrinka Blazing, MD Katrinka Blazing,  08/18/2022 9:20:58 AM This report has been signed electronically. Number of Addenda: 0

## 2022-08-18 NOTE — Transfer of Care (Addendum)
Immediate Anesthesia Transfer of Care Note  Patient: JAMICA KUWAHARA  Procedure(s) Performed: ESOPHAGOGASTRODUODENOSCOPY (EGD) WITH PROPOFOL BIOPSY  Patient Location: PACU  Anesthesia Type:General  Level of Consciousness: awake, drowsy, and patient cooperative  Airway & Oxygen Therapy: Patient Spontanous Breathing and Patient connected to nasal cannula oxygen  Post-op Assessment: Report given to RN and Post -op Vital signs reviewed and stable  Post vital signs: stable  Last Vitals:  Vitals Value Taken Time  BP 111/64  0917  Temp 36.4 0917  Pulse 55 0917  Resp 22 0917  SpO2 100 0917    Last Pain:  Vitals:   08/18/22 0902  TempSrc:   PainSc: 7       Patients Stated Pain Goal: 9 (08/18/22 0723)  Complications: No notable events documented.

## 2022-08-18 NOTE — H&P (Signed)
Bethany Wang is an 57 y.o. female.   Chief Complaint: Epigastric abdominal pain HPI: Bethany Wang is a 57 y.o. female with past medical history of breast cancer status post chemoradiation, coronary artery disease status post stent placement, brain tumor, depression, diabetes, hypertension, hyperlipidemia, stroke, seizures, who presents for evaluation of epigastric abdominal pain.  The patient reports having recurrent episodes abdominal pain after eating in the upper abdomen.  She reported especially worse in the morning but gets better during the day after taking her medications.  No nausea, vomiting, fever or chills.  Past Medical History:  Diagnosis Date   Anemia    Anxiety    Bilateral breast cysts 12/30/2015   Brain tumor (HCC)    Breast cancer (HCC)    Left Breast Cancer   Breast disorder    BV (bacterial vaginosis) 09/09/2015   Coronary atherosclerosis of native coronary artery    a. s/p DES to RCA in 2013 b. DES to LAD in 2014 Wang. cath in 04/2016 showing patent LAD stent with D2 jailed by LAD stent and CTO of RCA with left to right collaterals present   Cyst of pharynx or nasopharynx    [name]   Decreased libido 12/25/2018   Depression    Diabetes mellitus without complication (HCC)    Essential hypertension    Falls    x 3-4 in past year   GERD (gastroesophageal reflux disease)    Headache    Heart attack (HCC)    x4   History of hematuria    Mixed hyperlipidemia    Personal history of chemotherapy    Left Breast Cancer   Personal history of radiation therapy    Left Breast Cancer   Pre-diabetes    PUD (peptic ulcer disease)    Seizures (HCC)    Shingles 09/09/2015   Stroke (HCC)    12-2017 on Plavix, only deficit is headaches   Suicide attempt (HCC)    3 attempts in remote past   Trichimoniasis 08/20/2019   Treated 08/20/19    Uterine cancer (HCC)    Vitamin D deficiency disease 04/03/2019    Past Surgical History:  Procedure  Laterality Date   ABDOMINAL HYSTERECTOMY     BIOPSY  11/28/2019   Procedure: BIOPSY;  Surgeon: Malissa Hippo, MD;  [facility];  Service: Endoscopy;;  antral, gastric body    BLADDER SURGERY     BREAST LUMPECTOMY Left    BREAST LUMPECTOMY Right 02/24/2018   Procedure: RIGHT BREAST LUMPECTOMY ERAS PATHWAY;  Surgeon: Griselda Miner, MD;  Location: [facility] OR;  Service: General;  Laterality: Right;   CARDIAC CATHETERIZATION N/A 05/10/2016   Procedure: Left Heart Cath and Coronary Angiography;  Surgeon: Lyn Records, MD;  Location: [facility] INVASIVE CV LAB;  Service: Cardiovascular;  Laterality: N/A;   CAUTERIZE INNER NOSE  07/13/2020   COLONOSCOPY  May 2010   Fleishman: normal rectum, internal hemorrhoids, , benign colonic polyp   COLONOSCOPY N/A 01/05/2017   Procedure: COLONOSCOPY;  Surgeon: Malissa Hippo, MD;  Location: [facility];  Service: Endoscopy;  Laterality: N/A;  1:00   CORONARY PRESSURE/FFR STUDY N/A 10/23/2020   Procedure: INTRAVASCULAR PRESSURE WIRE/FFR STUDY;  Surgeon: Lyn Records, MD;  Location: [facility] INVASIVE CV LAB;  Service: Cardiovascular;  Laterality: N/A;   CORONARY PRESSURE/FFR STUDY N/A 03/30/2021   Procedure: INTRAVASCULAR PRESSURE WIRE/FFR STUDY;  Surgeon: Yvonne Kendall, MD;  Location: [facility] INVASIVE CV LAB;  Service: Cardiovascular;  Laterality: N/A;  ESOPHAGEAL DILATION  11/28/2019   Procedure: ESOPHAGEAL DILATION;  Surgeon: Malissa Hippo, MD;  [facility]: [facility];  Service: Endoscopy;;   ESOPHAGOGASTRODUODENOSCOPY     2001 Dr. Madilyn Fireman: distal esophagitis, small hiatal hernia,. Dr. Katrinka Blazing 2006? no records available currently, pt also reports  EGD a few years ago with Dr. Jena Gauss, do not have these reports anywhere in medical records   ESOPHAGOGASTRODUODENOSCOPY  06/02/2011   [id number] pill impaction as described above s/p dilation of a probable cervical esophageal web/bx abnormal esophageal and gastric mucosa. + H.pylori gastritis     ESOPHAGOGASTRODUODENOSCOPY (EGD) WITH PROPOFOL N/A 11/28/2019   Procedure: ESOPHAGOGASTRODUODENOSCOPY (EGD) WITH PROPOFOL;  Surgeon: Malissa Hippo, MD;  [facility];  Service: Endoscopy;  Laterality: N/A;  1020   EYE SURGERY     Removed glass   HAND SURGERY Right    Left breast lumpectomy     Benign   LEFT HEART CATH AND CORONARY ANGIOGRAPHY N/A 10/25/2018   Procedure: LEFT HEART CATH AND CORONARY ANGIOGRAPHY;  Surgeon: Iran Ouch, MD;  Location: [facility] INVASIVE CV LAB;  Service: Cardiovascular;  Laterality: N/A;   LEFT HEART CATH AND CORONARY ANGIOGRAPHY N/A 10/23/2020   Procedure: LEFT HEART CATH AND CORONARY ANGIOGRAPHY;  Surgeon: Lyn Records, MD;  Location: [facility] CV LAB;  Service: Cardiovascular;  Laterality: N/A;   LEFT HEART CATH AND CORONARY ANGIOGRAPHY N/A 03/30/2021   Procedure: LEFT HEART CATH AND CORONARY ANGIOGRAPHY;  Surgeon: Yvonne Kendall, MD;  Location: [facility]VASIVE CV LAB;  Service: Cardiovascular;  Laterality: N/A;   LEFT HEART CATH AND CORONARY ANGIOGRAPHY N/A 01/22/2022   Procedure: LEFT HEART CATH AND CORONARY ANGIOGRAPHY;  Surgeon: Tonny Bollman, MD;  Location: [facility] INVASIVE CV LAB;  Service: Cardiovascular;  Laterality: N/A;   LEFT HEART CATHETERIZATION WITH CORONARY ANGIOGRAM N/A 07/03/2014   Procedure: LEFT HEART CATHETERIZATION WITH CORONARY ANGIOGRAM;  Surgeon: Lyn Records, MD;  Location: [facility] CATH LAB;  Service: Cardiovascular;  Laterality: N/A;   POLYPECTOMY  01/05/2017   Procedure: POLYPECTOMY;  Surgeon: Malissa Hippo, MD;  [facility];  Service: Endoscopy;;  colon   SHOULDER SURGERY Left     Family History  Problem Relation Age of Onset   Colon cancer Paternal Grandfather    Cancer Father    Cancer Maternal Uncle    Alzheimer's disease Paternal Aunt    Cirrhosis Maternal Uncle    Cancer Paternal Aunt        lung   Aneurysm Mother        brain   Heart disease Brother    Heart attack Brother    Mental illness  Daughter    ADD / ADHD Daughter    Bipolar disorder Daughter    Mental illness Son    ADD / ADHD Son    Bipolar disorder Son    Mental illness Son    ADD / ADHD Son    Bipolar disorder Son    Social History:  reports that she quit smoking about 10 years ago. Her smoking use included cigarettes and cigars. She started smoking about 39 years ago. She has a 6.40 pack-year smoking history. She has never used smokeless tobacco. She reports that she does not drink alcohol and does not use drugs.  Allergies:  Allergies  Allergen Reactions   Bee Venom Anaphylaxis and Other (See Comments)    "throat swelled and had to the hospital" - Yellow jacket sting   Lipitor [Atorvastatin] Rash and Other (See Comments)  Liver damage & Upper Abdominal pain, Break out like a rash . Per last hosp visit. It exposed on inpatient stay.   Penicillins Anaphylaxis and Other (See Comments)    Has patient had a PCN reaction causing immediate rash, facial/tongue/throat swelling, SOB or lightheadedness with hypotension: Yes Has patient had a PCN reaction causing severe rash involving mucus membranes or skin necrosis: Yes Has patient had a PCN reaction that required hospitalization Yes Has patient had a PCN reaction occurring within the last 10 years: Yes If all of the above answers are "NO", then may proceed with Cephalosporin use. Patient states she carries an epi-pen   Shellfish Allergy Swelling   Spironolactone Other (See Comments)    Nausea, dehydration   Tomato Itching and Swelling   Valsartan Itching   Zofran [Ondansetron] Itching   Crestor [Rosuvastatin] Rash and Other (See Comments)    Upper Abdominal pain   Ranolazine Rash    Facility-Administered Medications Prior to Admission  Medication Dose Route Frequency Provider Last Rate Last Admin   NONFORMULARY OR COMPOUNDED ITEM 0.2 mL  0.2 mL Intramuscular Q7 days Bethany Wang, Bethany C, MD   0.2 mL at 10/23/19 1002   Medications Prior to Admission   Medication Sig Dispense Refill   ALPRAZolam (XANAX) 0.5 MG tablet Take 1 tablet (0.5 mg total) by mouth 2 (two) times daily as needed for anxiety. 30 tablet 0   amLODipine (NORVASC) 10 MG tablet Take 1 tablet (10 mg total) by mouth every evening. (Patient taking differently: Take 10 mg by mouth daily in the afternoon.) 90 tablet 3   aspirin EC 81 MG tablet Take 1 tablet (81 mg total) by mouth daily. 180 tablet 1   carvedilol (COREG) 25 MG tablet TAKE (1) TABLET TWICE DAILY. 90 tablet 3   chlorthalidone (HYGROTON) 25 MG tablet Take 1 tablet (25 mg total) by mouth daily. 90 tablet 3   Cholecalciferol (DIALYVITE VITAMIN D 5000) 125 MCG (5000 UT) capsule Take 5,000 Units by mouth in the morning.     clopidogrel (PLAVIX) 75 MG tablet Take 75 mg by mouth in the morning.     diphenhydrAMINE (BENADRYL) 25 MG tablet Take 25 mg by mouth every 6 (six) hours as needed for allergies.     escitalopram (LEXAPRO) 20 MG tablet Take 1 tablet (20 mg total) by mouth daily. (Patient taking differently: Take 20 mg by mouth daily with lunch.) 30 tablet 3   Evolocumab (REPATHA SURECLICK [place]) Inject 140 mg into the skin every 14 (fourteen) days.     fluorometholone (FML) 0.1 % ophthalmic suspension Place 1 drop into both eyes daily.     isosorbide mononitrate (IMDUR) 60 MG 24 hr tablet Take 1 tablet (60 mg total) by mouth 2 (two) times daily. 60 tablet 5   linaclotide (LINZESS) 290 MCG CAPS capsule Take 1 capsule (290 mcg total) by mouth daily before breakfast. 90 capsule 3   potassium chloride SA (KLOR-CON M) 20 MEQ tablet Take 1 tablet (20 mEq total) by mouth 2 (two) times daily. 90 tablet 1   RESTASIS 0.05 % ophthalmic emulsion Place 1 drop into both eyes 2 (two) times daily.     traZODone (DESYREL) 50 MG tablet Take 50-100 mg by mouth at bedtime.     albuterol (VENTOLIN HFA) 108 (90 Base) MCG/ACT inhaler Inhale 2 puffs into the lungs every 4 (four) hours as needed for wheezing or shortness of breath. 1 each 3   blood  glucose meter kit and supplies KIT Use to  check blood sugar daily. 1 each 0   doxazosin (CARDURA) 2 MG tablet Take 1 tablet (2 mg total) by mouth at bedtime. 30 tablet 2   fluticasone-salmeterol (ADVAIR) 100-50 MCG/ACT AEPB INHALE 1 PUFF BY MOUTH TWICE DAILY 60 each 10   nitroGLYCERIN (NITROSTAT) 0.4 MG SL tablet DISSOLVE 1 TABLET UNDER THE TONGUE AS NEEDED FOR CHEST PAIN EVERY 5 MINUTES UP TO 3 TIMES. IF NO RELIEF CALL 911. 25 tablet 1    Results for orders placed or performed during the hospital encounter of 08/18/22 (from the past 48 hour(s))  Glucose, capillary     Status: Abnormal   Collection Time: 08/18/22  7:28 AM  Result Value Ref Range   Glucose-Capillary 107 (H) 70 - 99 mg/dL    Comment: Glucose reference range applies only to samples taken after fasting for at least 8 hours.   No results found.  Review of Systems  Gastrointestinal:  Positive for abdominal pain.  All other systems reviewed and are negative.   Blood pressure (!) 151/90, pulse 67, temperature 97.7 F (36.5 Wang), temperature source Oral, resp. rate 11, height 5\' 4"  (1.626 m), weight 64.4 kg, SpO2 97 %. Physical Exam  GENERAL: The patient is AO x3, in no acute distress. HEENT: Head is normocephalic and atraumatic. EOMI are intact. Mouth is well hydrated and without lesions. NECK: Supple. No masses LUNGS: Clear to auscultation. No presence of rhonchi/wheezing/rales. Adequate chest expansion HEART: RRR, normal s1 and s2. ABDOMEN: Soft, nontender, no guarding, no peritoneal signs, and nondistended. BS +. No masses. EXTREMITIES: Without any cyanosis, clubbing, rash, lesions or edema. NEUROLOGIC: AOx3, no focal motor deficit. SKIN: no jaundice, no rashes  Assessment/Plan DIONNI SURA is a 57 y.o. female with past medical history of breast cancer status post chemoradiation, coronary artery disease status post stent placement, brain tumor, depression, diabetes, hypertension, hyperlipidemia, stroke, seizures, who  presents for evaluation of epigastric abdominal pain.  Will proceed with EGD.  Bethany Frame, MD 08/18/2022, 8:50 AM

## 2022-08-20 NOTE — Anesthesia Postprocedure Evaluation (Signed)
Anesthesia Post Note  Patient: Bethany Wang  Procedure(s) Performed: ESOPHAGOGASTRODUODENOSCOPY (EGD) WITH PROPOFOL BIOPSY  Patient location during evaluation: Phase II Anesthesia Type: General Level of consciousness: awake Pain management: pain level controlled Vital Signs Assessment: post-procedure vital signs reviewed and stable Respiratory status: spontaneous breathing and respiratory function stable Cardiovascular status: blood pressure returned to baseline and stable Postop Assessment: no headache and no apparent nausea or vomiting Anesthetic complications: no Comments: Late entry   No notable events documented.   Last Vitals:  Vitals:   08/18/22 0723 08/18/22 0917  BP: (!) 151/90 111/64  Pulse: 67   Resp: 11 (!) 22  Temp: 36.5 C 36.4 C  SpO2: 97% 100%    Last Pain:  Vitals:   08/18/22 0917  TempSrc: Oral  PainSc: 0-No pain                 Windell Norfolk

## 2022-08-23 DIAGNOSIS — S90861A Insect bite (nonvenomous), right foot, initial encounter: Secondary | ICD-10-CM | POA: Diagnosis not present

## 2022-08-23 DIAGNOSIS — Z5181 Encounter for therapeutic drug level monitoring: Secondary | ICD-10-CM | POA: Diagnosis not present

## 2022-08-23 DIAGNOSIS — Z6824 Body mass index (BMI) 24.0-24.9, adult: Secondary | ICD-10-CM | POA: Diagnosis not present

## 2022-08-23 DIAGNOSIS — I1A0 Resistant hypertension: Secondary | ICD-10-CM | POA: Diagnosis not present

## 2022-08-23 LAB — BASIC METABOLIC PANEL WITH GFR
BUN/Creatinine Ratio: 9 (ref 9–23)
BUN: 7 mg/dL (ref 6–24)
CO2: 21 mmol/L (ref 20–29)
Calcium: 9.9 mg/dL (ref 8.7–10.2)
Chloride: 105 mmol/L (ref 96–106)
Creatinine, Ser: 0.82 mg/dL (ref 0.57–1.00)
Glucose: 93 mg/dL (ref 70–99)
Potassium: 4 mmol/L (ref 3.5–5.2)
Sodium: 142 mmol/L (ref 134–144)
eGFR: 84 mL/min/{1.73_m2}

## 2022-08-24 ENCOUNTER — Encounter (HOSPITAL_COMMUNITY): Payer: Self-pay | Admitting: Gastroenterology

## 2022-08-27 LAB — SURGICAL PATHOLOGY

## 2022-08-30 ENCOUNTER — Other Ambulatory Visit (INDEPENDENT_AMBULATORY_CARE_PROVIDER_SITE_OTHER): Payer: Self-pay | Admitting: Gastroenterology

## 2022-08-30 DIAGNOSIS — K2270 Barrett's esophagus without dysplasia: Secondary | ICD-10-CM

## 2022-08-30 DIAGNOSIS — R1013 Epigastric pain: Secondary | ICD-10-CM

## 2022-08-30 MED ORDER — OMEPRAZOLE 40 MG PO CPDR
40.0000 mg | DELAYED_RELEASE_CAPSULE | Freq: Every day | ORAL | 3 refills | Status: DC
Start: 2022-08-30 — End: 2023-02-17

## 2022-09-07 ENCOUNTER — Ambulatory Visit (HOSPITAL_COMMUNITY)
Admission: RE | Admit: 2022-09-07 | Discharge: 2022-09-07 | Disposition: A | Discharge status: Home / Self Care | Payer: 59 | Source: Ambulatory Visit | Attending: Cardiovascular Disease | Admitting: Cardiovascular Disease

## 2022-09-07 DIAGNOSIS — I1A0 Resistant hypertension: Secondary | ICD-10-CM

## 2022-09-07 DIAGNOSIS — R55 Syncope and collapse: Secondary | ICD-10-CM

## 2022-09-07 LAB — ECHOCARDIOGRAM COMPLETE
AR max vel: 2.92 cm2
AV Area VTI: 2.98 cm2
AV Area mean vel: 2.92 cm2
AV Mean grad: 5.1 mmHg
AV Peak grad: 12.3 mmHg
Ao pk vel: 1.75 m/s
Area-P 1/2: 3.58 cm2
Calc EF: 67 %
MV M vel: 3.84 m/s
MV Peak grad: 59 mmHg
P 1/2 time: 874 msec
S' Lateral: 3.3 cm
Single Plane A2C EF: 70.8 %
Single Plane A4C EF: 65.2 %

## 2022-09-07 NOTE — Progress Notes (Signed)
  Echocardiogram 2D Echocardiogram has been performed.  Bethany Wang 09/07/2022, 2:00 PM

## 2022-09-13 ENCOUNTER — Other Ambulatory Visit (HOSPITAL_BASED_OUTPATIENT_CLINIC_OR_DEPARTMENT_OTHER): Payer: Self-pay | Admitting: *Deleted

## 2022-09-13 DIAGNOSIS — I351 Nonrheumatic aortic (valve) insufficiency: Secondary | ICD-10-CM

## 2022-09-22 ENCOUNTER — Encounter (INDEPENDENT_AMBULATORY_CARE_PROVIDER_SITE_OTHER): Payer: Self-pay | Admitting: Gastroenterology

## 2022-09-22 DIAGNOSIS — T466X5A Adverse effect of antihyperlipidemic and antiarteriosclerotic drugs, initial encounter: Secondary | ICD-10-CM | POA: Diagnosis not present

## 2022-09-22 DIAGNOSIS — E785 Hyperlipidemia, unspecified: Secondary | ICD-10-CM | POA: Diagnosis not present

## 2022-09-22 DIAGNOSIS — I351 Nonrheumatic aortic (valve) insufficiency: Secondary | ICD-10-CM | POA: Diagnosis not present

## 2022-09-22 DIAGNOSIS — D497 Neoplasm of unspecified behavior of endocrine glands and other parts of nervous system: Secondary | ICD-10-CM | POA: Diagnosis not present

## 2022-09-22 DIAGNOSIS — R109 Unspecified abdominal pain: Secondary | ICD-10-CM | POA: Diagnosis not present

## 2022-09-22 DIAGNOSIS — G720 Drug-induced myopathy: Secondary | ICD-10-CM | POA: Diagnosis not present

## 2022-09-22 DIAGNOSIS — E039 Hypothyroidism, unspecified: Secondary | ICD-10-CM | POA: Diagnosis not present

## 2022-09-22 DIAGNOSIS — I2510 Atherosclerotic heart disease of native coronary artery without angina pectoris: Secondary | ICD-10-CM | POA: Diagnosis not present

## 2022-09-22 DIAGNOSIS — R7989 Other specified abnormal findings of blood chemistry: Secondary | ICD-10-CM | POA: Diagnosis not present

## 2022-09-22 DIAGNOSIS — I509 Heart failure, unspecified: Secondary | ICD-10-CM | POA: Diagnosis not present

## 2022-09-22 DIAGNOSIS — I10 Essential (primary) hypertension: Secondary | ICD-10-CM | POA: Diagnosis not present

## 2022-09-29 ENCOUNTER — Telehealth (HOSPITAL_BASED_OUTPATIENT_CLINIC_OR_DEPARTMENT_OTHER): Payer: Self-pay | Admitting: *Deleted

## 2022-09-29 ENCOUNTER — Ambulatory Visit (HOSPITAL_BASED_OUTPATIENT_CLINIC_OR_DEPARTMENT_OTHER): Payer: 59 | Admitting: Pharmacist Clinician (PhC)/ Clinical Pharmacy Specialist

## 2022-09-29 ENCOUNTER — Encounter (HOSPITAL_BASED_OUTPATIENT_CLINIC_OR_DEPARTMENT_OTHER): Payer: Self-pay

## 2022-09-29 NOTE — Progress Notes (Deleted)
Office Visit    Patient Name: Bethany Wang Date of Encounter: 09/29/2022  Primary Care Provider:  Donetta Potts, MD Primary Cardiologist:  Chrystie Nose, MD  Chief Complaint    Hypertension - Advanced hypertension clinic  Past Medical History   CAD 2013 DES to RCA, 2014 DES to LAD; RCA CTO, moderate ISR of LAD  HLD LDL on evolocumab  CVA 2019 on clopidogrel, only deficit is headaches    Allergies  Allergen Reactions   Bee Venom Anaphylaxis and Other (See Comments)    "throat swelled and had to the hospital" - Yellow jacket sting   Lipitor [Atorvastatin] Rash and Other (See Comments)    Liver damage & Upper Abdominal pain, Break out like a rash . Per last hosp visit. It exposed on inpatient stay.   Penicillins Anaphylaxis and Other (See Comments)    Has patient had a PCN reaction causing immediate rash, facial/tongue/throat swelling, SOB or lightheadedness with hypotension: Yes Has patient had a PCN reaction causing severe rash involving mucus membranes or skin necrosis: Yes Has patient had a PCN reaction that required hospitalization Yes Has patient had a PCN reaction occurring within the last 10 years: Yes If all of the above answers are "NO", then may proceed with Cephalosporin use. Patient states she carries an epi-pen   Shellfish Allergy Swelling   Spironolactone Other (See Comments)    Nausea, dehydration   Tomato Itching and Swelling   Valsartan Itching   Zofran [Ondansetron] Itching   Crestor [Rosuvastatin] Rash and Other (See Comments)    Upper Abdominal pain   Ranolazine Rash    History of Present Illness    Bethany Wang is a 57 y.o. female patient who was referred to the [facility].  Dr. Rennis Golden is her primary cardiologist.    She was first seen in the [facility] back in July, by [facility], and at that time her pressure was 190/90.  At the time she had stopped her amlodipine and HCTZ at the request of her PCP, but patient was unsure as to  why.  At that visit patient declined re-starting the medications, noting that her PR was elevated due to an argument prior to the appointment.  At follow up in September, her pressure was normal at 120/60.  Has a lot of conflict at home with her husband, which raises her blood pressure.  She has been working with a psychiatrist to deal with some of these stresses and based on his recommendation she does take time for meditation each morning and is trying to eat regular meals, but admits doesn't often eat more than once daily.  In addition to the conflict problems with her husband, she is raising her 3 grandchildren (53, 82, 49), the oldest of whom is moving out on his own in the next few weeks.  Her medications are packaged in daily envelopes at [facility] pharmacy, for AM, mid-day and PM.  Dr. Duke Salvia saw her last in April and her pressure was once again elevated to 185/96 and she reported as high as 298/120 at home.  Dr Duke Salvia started chlorthalidone 25 mg daily as well as KCl 20 mEq.  Today she returns for follow up.      Blood Pressure Goal:  130/80  Current Medications: amlodipine 10 mg qd, carvedilol 25 mg bid, chlorthalidone 25 mg every day, doxazosin 2 mg at bedtime,    Previously tried:   valsartan - itching, spironolactone - nausea/dehydration  Family Hx: mgf died  from MI, has 13 siblings; poor relationship with mother;  siblings all healthy; daughter bipolar, son with DM, HTN, other son with alcohol issues (siblings all with alcohol issues as si daughter)  Social Hx: no tobacco (quit 9 years ago); no alcohol; 1 - 1.5 cups coffee/day    Diet:  mostly home cooked meals, no meat, fresh/raw vegetables; has garden and freezes; gets protein from beans and nuts; only eats one meal a day  Exercise: walks track daily 30-60 minutes per day  Home BP readings: In [employer]  14 day average  162/73  HR 72 (range 129-182/65-92)  30 day average  148/82  HR 71  (range 119-182/65-106)   Accessory  Clinical Findings    Lab Results  Component Value Date   CREATININE 0.82 08/23/2022   BUN 7 08/23/2022   NA 142 08/23/2022   K 4.0 08/23/2022   CL 105 08/23/2022   CO2 21 08/23/2022   Lab Results  Component Value Date   ALT 17 07/15/2022   AST 21 07/15/2022   ALKPHOS 110 07/15/2022   BILITOT 0.7 07/15/2022   Lab Results  Component Value Date   HGBA1C 5.1 10/29/2019    Home Medications    Current Outpatient Medications  Medication Sig Dispense Refill   albuterol (VENTOLIN HFA) 108 (90 Base) MCG/ACT inhaler Inhale 2 puffs into the lungs every 4 (four) hours as needed for wheezing or shortness of breath. 1 each 3   ALPRAZolam (XANAX) 0.5 MG tablet Take 1 tablet (0.5 mg total) by mouth 2 (two) times daily as needed for anxiety. 30 tablet 0   amLODipine (NORVASC) 10 MG tablet Take 1 tablet (10 mg total) by mouth every evening. (Patient taking differently: Take 10 mg by mouth daily in the afternoon.) 90 tablet 3   aspirin EC 81 MG tablet Take 1 tablet (81 mg total) by mouth daily. 180 tablet 1   blood glucose meter kit and supplies KIT Use to check blood sugar daily. 1 each 0   carvedilol (COREG) 25 MG tablet TAKE (1) TABLET TWICE DAILY. 90 tablet 3   chlorthalidone (HYGROTON) 25 MG tablet Take 1 tablet (25 mg total) by mouth daily. 90 tablet 3   Cholecalciferol (DIALYVITE VITAMIN D 5000) 125 MCG (5000 UT) capsule Take 5,000 Units by mouth in the morning.     clopidogrel (PLAVIX) 75 MG tablet Take 75 mg by mouth in the morning.     diphenhydrAMINE (BENADRYL) 25 MG tablet Take 25 mg by mouth every 6 (six) hours as needed for allergies.     doxazosin (CARDURA) 2 MG tablet Take 1 tablet (2 mg total) by mouth at bedtime. 30 tablet 2   escitalopram (LEXAPRO) 20 MG tablet Take 1 tablet (20 mg total) by mouth daily. (Patient taking differently: Take 20 mg by mouth daily with lunch.) 30 tablet 3   Evolocumab (REPATHA SURECLICK [place]) Inject 140 mg into the skin every 14 (fourteen) days.      fluorometholone (FML) 0.1 % ophthalmic suspension Place 1 drop into both eyes daily.     fluticasone-salmeterol (ADVAIR) 100-50 MCG/ACT AEPB INHALE 1 PUFF BY MOUTH TWICE DAILY 60 each 10   isosorbide mononitrate (IMDUR) 60 MG 24 hr tablet Take 1 tablet (60 mg total) by mouth 2 (two) times daily. 60 tablet 5   linaclotide (LINZESS) 290 MCG CAPS capsule Take 1 capsule (290 mcg total) by mouth daily before breakfast. 90 capsule 3   nitroGLYCERIN (NITROSTAT) 0.4 MG SL tablet DISSOLVE 1 TABLET UNDER  THE TONGUE AS NEEDED FOR CHEST PAIN EVERY 5 MINUTES UP TO 3 TIMES. IF NO RELIEF CALL 911. 25 tablet 1   omeprazole (PRILOSEC) 40 MG capsule Take 1 capsule (40 mg total) by mouth daily. 90 capsule 3   potassium chloride SA (KLOR-CON M) 20 MEQ tablet Take 1 tablet (20 mEq total) by mouth 2 (two) times daily. 90 tablet 1   RESTASIS 0.05 % ophthalmic emulsion Place 1 drop into both eyes 2 (two) times daily.     traZODone (DESYREL) 50 MG tablet Take 50-100 mg by mouth at bedtime.     Current Facility-Administered Medications  Medication Dose Route Frequency Provider Last Rate Last Admin   NONFORMULARY OR COMPOUNDED ITEM 0.2 mL  0.2 mL Intramuscular Q7 days Karilyn Cota, Nimish C, MD   0.2 mL at 10/23/19 1002     Assessment & Plan    No BP recorded.  {Refresh Note OR Click here to enter BP  :1}*** No problem-specific Assessment & Plan notes found for this encounter.   Phillips Hay PharmD CPP [facility]  [address] [place], [place] [address] [phone]

## 2022-09-29 NOTE — Telephone Encounter (Signed)
Tried to call 4 more times  Left message with address on voicemail

## 2022-09-29 NOTE — Telephone Encounter (Signed)
Patient came in office today to see Bethany Wang  She was very upset with her Echo results    Alver Sorrow, NP 09/12/2022  7:06 PM EDT     Normal heart pumping function. Moderate to severe aortic regurgitation. Elevated right atrial pressure suggesting volume overload. Chlorthalidone will help with this. Reach out to the office if she notes any new dyspnea, edema.   Repeat echo in 6 months to reassess aortic regurgitation.   Stated she had never been told she had any issues and her doctor told her she needed to get this fixed right away.   Was able to get patient an appointment with Dr Rennis Golden 6/21 at [facility] office When called patient this am to let her know about the appointment she was driving and could not write down. Told her would call back this afternoon  Tried calling went to vm, left message to call back

## 2022-10-01 ENCOUNTER — Ambulatory Visit: Payer: 59 | Attending: Internal Medicine | Admitting: Internal Medicine

## 2022-10-01 VITALS — BP 182/88 | HR 77 | Ht 64.0 in | Wt 150.8 lb

## 2022-10-01 DIAGNOSIS — I471 Supraventricular tachycardia, unspecified: Secondary | ICD-10-CM | POA: Diagnosis not present

## 2022-10-01 DIAGNOSIS — R55 Syncope and collapse: Secondary | ICD-10-CM

## 2022-10-01 DIAGNOSIS — R002 Palpitations: Secondary | ICD-10-CM | POA: Diagnosis not present

## 2022-10-01 DIAGNOSIS — I1A0 Resistant hypertension: Secondary | ICD-10-CM

## 2022-10-01 DIAGNOSIS — I200 Unstable angina: Secondary | ICD-10-CM

## 2022-10-01 MED ORDER — METOPROLOL TARTRATE 100 MG PO TABS: 100.0000 mg | ORAL_TABLET | Freq: Two times a day (BID) | ORAL | 3 refills | Status: DC

## 2022-10-01 NOTE — Progress Notes (Signed)
Office Note  Chief Complaint:  No complaints  Primary Care Physician: Donetta Potts, MD  Primary Cardiologist:  Chrystie Nose, MD  HPI:  Bethany Wang is a 57 y.o. female who is being seen today for the evaluation of dyslipidemia at the request of Donetta Potts, MD. This is a pleasant 57 year old female that I do not recall seeing in the hospital but she remembers seeing me with a recent hospitalization.  She was hospitalized in July with chest pain and underwent left heart catheterization.  This demonstrated occlusion of the right coronary artery with the previously placed coronary stent and left to right collaterals.  There was a 50% in-stent restenosis of the proximal LAD and 90% diagonal ostial narrowing.  There is also 60 to 70% obtuse marginal narrowing and 50% distal circumflex disease.  Medical therapy was recommended.  She was referred today for management of dyslipidemia.  Unfortunately she was intolerant of statins including atorvastatin and rosuvastatin, both of which caused significant side effects.  Subsequently she had been switched by her primary care provider to Repatha and has already had a dose of the medication.  It is too early to look for response to this medication however I agree with this therapy.  More concerning, however today is symptoms of progressive chest pain.  She reports that ever since this past summer she has had worsening chest pain despite long-acting nitroglycerin.  She has been using short acting nitroglycerin very frequently and gets some minimal relief however it is short-lived.  EKG today shows normal sinus rhythm with some nonspecific T wave changes.  06/08/2021  Bethany Wang returns today for recurrent chest pain.  She has been in the emergency department twice this year including the other week with chest pain.  She said she had an episode when she was at church including diaphoresis, shortness of breath and left arm pain.  She took  nitroglycerin and it took several minutes to up to an hour to improve.  She also notes she gets short of breath and has chest discomfort when exerting herself.  She says she gets chest discomfort working in her garden.  I had sent her for cardiac catheterization in December 2022 for symptoms concerning for unstable angina.  Demonstrated moderate to severe multivessel coronary disease, similar to prior cath in July 2022 however FFR was negative of the mid LAD stent in OM1 lesion and a chronic total occlusion of the proximal RCA stent was again noted.  LVEDP was mildly elevated.  More antianginal therapy was recommended.  Initially she was switched from isosorbide 30 mg 3 times daily to 60 mg twice daily.  She says initially this helped somewhat but since then her symptoms have worsened.  She is now taking sublingual nitro almos  09/04/2021  Bethany Wang is seen today in follow-up.  She reports marked improvement in her symptoms.  Blood pressure is now much better controlled at 124/76.  It turns out she had a rash that was attributable to the Ranexa.  She stopped that.  She had been switched to Repatha which she is tolerating very well and has had a significant improvement in her cholesterol.  In February showed total cholesterol 123 (down from 216), triglycerides 72, HDL 52 and LDL 57.  Overall she is very pleased with how she is feeling.  She is still struggling with bilateral hip pain and has been working with Dr. Kevan Ny who is contemplating sequential, bilateral hip replacement.  01/20/2022  Bethany Wang was an acute add-on visit today.  Apparently she was seen this morning while doing rehab by her primary care physician who is next-door.  She was noted to be tachycardic and in talking with her PCP he felt that she needed to be in the hospital.  Her blood pressure was quite elevated.  This had upset her.  She contacted [place] and was able to get in to an appointment today.  In speaking with her, she says that  she has had significant increase in the need for nitroglycerin over the past several weeks.  In fact she is taking it about every other day in addition to her long-acting nitrates.  Her last heart catheterization in 2022 did show moderate to severe stenosis of the LAD (in-stent), and there is chronic total occlusion of the proximal RCA stent with left to right collaterals.  FFR was nonsignificant and no intervention was performed.  Increased antianginal therapy was recommended.  Despite this, it seems that her symptoms continue to worsen.  She has had intermittent episodes of tachycardia.  She had monitoring over the summer which showed some episodes of nonsustained VT.  Her beta-blocker was titrated.  PMHx:  Past Medical History:  Diagnosis Date   Anemia    Anxiety    Bilateral breast cysts 12/30/2015   Brain tumor (HCC)    Breast cancer (HCC)    Left Breast Cancer   Breast disorder    BV (bacterial vaginosis) 09/09/2015   Coronary atherosclerosis of native coronary artery    a. s/p DES to RCA in 2013 b. DES to LAD in 2014 c. cath in 04/2016 showing patent LAD stent with D2 jailed by LAD stent and CTO of RCA with left to right collaterals present   Cyst of pharynx or nasopharynx    [name]   Decreased libido 12/25/2018   Depression    Diabetes mellitus without complication (HCC)    Essential hypertension    Falls    x 3-4 in past year   GERD (gastroesophageal reflux disease)    Headache    Heart attack (HCC)    x4   History of hematuria    Mixed hyperlipidemia    Personal history of chemotherapy    Left Breast Cancer   Personal history of radiation therapy    Left Breast Cancer   Pre-diabetes    PUD (peptic ulcer disease)    Seizures (HCC)    Shingles 09/09/2015   Stroke (HCC)    12-2017 on Plavix, only deficit is headaches   Suicide attempt (HCC)    3 attempts in remote past   Trichimoniasis 08/20/2019   Treated 08/20/19    Uterine cancer (HCC)     Vitamin D deficiency disease 04/03/2019    Past Surgical History:  Procedure Laterality Date   ABDOMINAL HYSTERECTOMY     BIOPSY  11/28/2019   Procedure: BIOPSY;  Surgeon: Malissa Hippo, MD;  [facility];  Service: Endoscopy;;  antral, gastric body    BIOPSY  08/18/2022   Procedure: BIOPSY;  Surgeon: Dolores Frame, MD;  [facility]: [facility];  Service: Gastroenterology;;   BLADDER SURGERY     BREAST LUMPECTOMY Left    BREAST LUMPECTOMY Right 02/24/2018   Procedure: RIGHT BREAST LUMPECTOMY ERAS PATHWAY;  Surgeon: Griselda Miner, MD;  Location: [facility] OR;  Service: General;  Laterality: Right;   CARDIAC CATHETERIZATION N/A 05/10/2016   Procedure: Left Heart Cath and Coronary Angiography;  Surgeon: Sherilyn Cooter  Malissa Hippo, MD;  [facility]: [facility];  Service: Cardiovascular;  Laterality: N/A;   CAUTERIZE INNER NOSE  07/13/2020   COLONOSCOPY  May 2010   Fleishman: normal rectum, internal hemorrhoids, , benign colonic polyp   COLONOSCOPY N/A 01/05/2017   Procedure: COLONOSCOPY;  Surgeon: Malissa Hippo, MD;  [facility];  Service: Endoscopy;  Laterality: N/A;  1:00   CORONARY PRESSURE/FFR STUDY N/A 10/23/2020   Procedure: INTRAVASCULAR PRESSURE WIRE/FFR STUDY;  Surgeon: Lyn Records, MD;  [facility]: [facility];  Service: Cardiovascular;  Laterality: N/A;   CORONARY PRESSURE/FFR STUDY N/A 03/30/2021   Procedure: INTRAVASCULAR PRESSURE WIRE/FFR STUDY;  Surgeon: Yvonne Kendall, MD;  [facility]: [facility];  Service: Cardiovascular;  Laterality: N/A;   ESOPHAGEAL DILATION  11/28/2019   Procedure: ESOPHAGEAL DILATION;  Surgeon: Malissa Hippo, MD;  [facility];  Service: Endoscopy;;   ESOPHAGOGASTRODUODENOSCOPY     2001 Dr. Madilyn Fireman: distal esophagitis, small hiatal hernia,. Dr. Katrinka Blazing 2006? no records available currently, pt also reports  EGD a few years ago with Dr. Jena Gauss, do not have these reports anywhere in medical records    ESOPHAGOGASTRODUODENOSCOPY  06/02/2011   FUX:NATFTD pill impaction as described above s/p dilation of a probable cervical esophageal web/bx abnormal esophageal and gastric mucosa. + H.pylori gastritis    ESOPHAGOGASTRODUODENOSCOPY (EGD) WITH PROPOFOL N/A 11/28/2019   Procedure: ESOPHAGOGASTRODUODENOSCOPY (EGD) WITH PROPOFOL;  Surgeon: Malissa Hippo, MD;  [facility];  Service: Endoscopy;  Laterality: N/A;  1020   ESOPHAGOGASTRODUODENOSCOPY (EGD) WITH PROPOFOL N/A 08/18/2022   Procedure: ESOPHAGOGASTRODUODENOSCOPY (EGD) WITH PROPOFOL;  Surgeon: Dolores Frame, MD;  [facility];  Service: Gastroenterology;  Laterality: N/A;  8:30AM;ASA 1   EYE SURGERY     Removed glass   HAND SURGERY Right    Left breast lumpectomy     Benign   LEFT HEART CATH AND CORONARY ANGIOGRAPHY N/A 10/25/2018   Procedure: LEFT HEART CATH AND CORONARY ANGIOGRAPHY;  Surgeon: Iran Ouch, MD;  [facility];  Service: Cardiovascular;  Laterality: N/A;   LEFT HEART CATH AND CORONARY ANGIOGRAPHY N/A 10/23/2020   Procedure: LEFT HEART CATH AND CORONARY ANGIOGRAPHY;  Surgeon: Lyn Records, MD;  [facility];  Service: Cardiovascular;  Laterality: N/A;   LEFT HEART CATH AND CORONARY ANGIOGRAPHY N/A 03/30/2021   Procedure: LEFT HEART CATH AND CORONARY ANGIOGRAPHY;  Surgeon: Yvonne Kendall, MD;  [facility];  Service: Cardiovascular;  Laterality: N/A;   LEFT HEART CATH AND CORONARY ANGIOGRAPHY N/A 01/22/2022   Procedure: LEFT HEART CATH AND CORONARY ANGIOGRAPHY;  Surgeon: Tonny Bollman, MD;  Location: [facility] CV [facility];  Service: Cardiovascular;  Laterality: N/A;   LEFT HEART CATHETERIZATION WITH CORONARY ANGIOGRAM N/A 07/03/2014   Procedure: LEFT HEART CATHETERIZATION WITH CORONARY ANGIOGRAM;  Surgeon: Lyn Records, MD;  [facility] LAB;  Service: Cardiovascular;  Laterality: N/A;   POLYPECTOMY  01/05/2017   Procedure: POLYPECTOMY;   Surgeon: Malissa Hippo, MD;  [facility];  Service: Endoscopy;;  colon   SHOULDER SURGERY Left     FAMHx:  Family History  Problem Relation Age of Onset   Colon cancer Paternal Grandfather    Cancer Father    Cancer Maternal Uncle    Alzheimer's disease Paternal Aunt    Cirrhosis Maternal Uncle    Cancer Paternal Aunt        lung   Aneurysm Mother  brain   Heart disease Brother    Heart attack Brother    Mental illness Daughter    ADD / ADHD Daughter    Bipolar disorder Daughter    Mental illness Son    ADD / ADHD Son    Bipolar disorder Son    Mental illness Son    ADD / ADHD Son    Bipolar disorder Son     SOCHx:   reports that she quit smoking about 10 years ago. Her smoking use included cigarettes and cigars. She started smoking about 39 years ago. She has a 6.40 pack-year smoking history. She has never used smokeless tobacco. She reports that she does not drink alcohol and does not use drugs.  ALLERGIES:  Allergies  Allergen Reactions   Bee Venom Anaphylaxis and Other (See Comments)    "throat swelled and had to the hospital" - Yellow jacket sting   Lipitor [Atorvastatin] Rash and Other (See Comments)    Liver damage & Upper Abdominal pain, Break out like a rash . Per last hosp visit. It exposed on inpatient stay.   Penicillins Anaphylaxis and Other (See Comments)    Has patient had a PCN reaction causing immediate rash, facial/tongue/throat swelling, SOB or lightheadedness with hypotension: Yes Has patient had a PCN reaction causing severe rash involving mucus membranes or skin necrosis: Yes Has patient had a PCN reaction that required hospitalization Yes Has patient had a PCN reaction occurring within the last 10 years: Yes If all of the above answers are "NO", then may proceed with Cephalosporin use. Patient states she carries an epi-pen   Shellfish Allergy Swelling   Spironolactone Other (See Comments)    Nausea, dehydration   Tomato  Itching and Swelling   Valsartan Itching   Zofran [Ondansetron] Itching   Crestor [Rosuvastatin] Rash and Other (See Comments)    Upper Abdominal pain   Ranolazine Rash    ROS: Pertinent items noted in HPI and remainder of comprehensive ROS otherwise negative.  HOME MEDS: Current Outpatient Medications on File Prior to Visit  Medication Sig Dispense Refill   albuterol (VENTOLIN HFA) 108 (90 Base) MCG/ACT inhaler Inhale 2 puffs into the lungs every 4 (four) hours as needed for wheezing or shortness of breath. 1 each 3   ALPRAZolam (XANAX) 0.5 MG tablet Take 1 tablet (0.5 mg total) by mouth 2 (two) times daily as needed for anxiety. 30 tablet 0   amLODipine (NORVASC) 10 MG tablet Take 1 tablet (10 mg total) by mouth every evening. (Patient taking differently: Take 10 mg by mouth daily in the afternoon.) 90 tablet 3   aspirin EC 81 MG tablet Take 1 tablet (81 mg total) by mouth daily. 180 tablet 1   blood glucose meter kit and supplies KIT Use to check blood sugar daily. 1 each 0   carvedilol (COREG) 25 MG tablet TAKE (1) TABLET TWICE DAILY. 90 tablet 3   chlorthalidone (HYGROTON) 25 MG tablet Take 1 tablet (25 mg total) by mouth daily. 90 tablet 3   Cholecalciferol (DIALYVITE VITAMIN D 5000) 125 MCG (5000 UT) capsule Take 5,000 Units by mouth in the morning.     clopidogrel (PLAVIX) 75 MG tablet Take 75 mg by mouth in the morning.     diphenhydrAMINE (BENADRYL) 25 MG tablet Take 25 mg by mouth every 6 (six) hours as needed for allergies.     doxazosin (CARDURA) 2 MG tablet Take 1 tablet (2 mg total) by mouth at bedtime. 30 tablet 2  escitalopram (LEXAPRO) 20 MG tablet Take 1 tablet (20 mg total) by mouth daily. (Patient taking differently: Take 20 mg by mouth daily with lunch.) 30 tablet 3   Evolocumab (REPATHA SURECLICK [place]) Inject 140 mg into the skin every 14 (fourteen) days.     fluorometholone (FML) 0.1 % ophthalmic suspension Place 1 drop into both eyes daily.      fluticasone-salmeterol (ADVAIR) 100-50 MCG/ACT AEPB INHALE 1 PUFF BY MOUTH TWICE DAILY 60 each 10   isosorbide mononitrate (IMDUR) 60 MG 24 hr tablet Take 1 tablet (60 mg total) by mouth 2 (two) times daily. 60 tablet 5   linaclotide (LINZESS) 290 MCG CAPS capsule Take 1 capsule (290 mcg total) by mouth daily before breakfast. 90 capsule 3   nitroGLYCERIN (NITROSTAT) 0.4 MG SL tablet DISSOLVE 1 TABLET UNDER THE TONGUE AS NEEDED FOR CHEST PAIN EVERY 5 MINUTES UP TO 3 TIMES. IF NO RELIEF CALL 911. 25 tablet 1   omeprazole (PRILOSEC) 40 MG capsule Take 1 capsule (40 mg total) by mouth daily. 90 capsule 3   potassium chloride SA (KLOR-CON M) 20 MEQ tablet Take 1 tablet (20 mEq total) by mouth 2 (two) times daily. 90 tablet 1   RESTASIS 0.05 % ophthalmic emulsion Place 1 drop into both eyes 2 (two) times daily.     traZODone (DESYREL) 50 MG tablet Take 50-100 mg by mouth at bedtime.     Current Facility-Administered Medications on File Prior to Visit  Medication Dose Route Frequency Provider Last Rate Last Admin   NONFORMULARY OR COMPOUNDED ITEM 0.2 mL  0.2 mL Intramuscular Q7 days Karilyn Cota, Nimish C, MD   0.2 mL at 10/23/19 1002    LABS/IMAGING: No results found for this or any previous visit (from the past 48 hour(s)). No results found.  LIPID PANEL:    Component Value Date/Time   CHOL 153 01/20/2022 1446   TRIG 85 01/20/2022 1446   HDL 74 01/20/2022 1446   CHOLHDL 2.1 01/20/2022 1446   VLDL 17 01/20/2022 1446   LDLCALC 62 01/20/2022 1446   LDLCALC 146 (H) 07/03/2020 1334    WEIGHTS: Wt Readings from Last 3 Encounters:  10/01/22 150 lb 12.8 oz (68.4 kg)  08/18/22 142 lb (64.4 kg)  08/10/22 154 lb 8 oz (70.1 kg)    VITALS: BP (!) 182/88 (BP Location: Left Arm, Patient Position: Sitting, Cuff Size: Normal)   Pulse 77   Ht 5\' 4"  (1.626 m)   Wt 150 lb 12.8 oz (68.4 kg)   SpO2 96%   BMI 25.88 kg/m   EXAM: General appearance: alert and no distress Neck: no carotid bruit, no JVD,  and thyroid not enlarged, symmetric, no tenderness/mass/nodules Lungs: clear to auscultation bilaterally Heart: regular rate and rhythm, S1, S2 normal, no murmur, click, rub or gallop Abdomen: soft, non-tender; bowel sounds normal; no masses,  no organomegaly Extremities: extremities normal, atraumatic, no cyanosis or edema Pulses: 2+ and symmetric Skin: Skin color, texture, turgor normal. No rashes or lesions Neurologic: Grossly normal Psych: Pleasant  EKG: N/A  ASSESSMENT: Unstable angina Coronary artery disease with prior PCI and occluded right coronary with left-to-right collaterals, 50% diffuse LAD stenosis (10/2020) -repeat cardiac catheterization 03/2021, unchanged, medical therapy recommended Mixed dyslipidemia, goal LDL less than 70 Statin allergic - hives, now on Repatha Hypertension Moderate to severe AI  PLAN: 1.   Ms. Wang has had recent significant increased need for her nitroglycerin.  She is also had intermittent arrhythmias and was noted to have some NSVT on  her monitor.  Today she was tachycardic and hypertensive which was unusual.  She reports being more short of breath and fatigues easily and seems to get dizzy after taking nitroglycerin.  I suspect she is having unstable angina and has had progression of her coronary disease since her last cath in December of last year.  I would recommend a repeat left heart catheterization.  We again discussed the risk, benefits and alternatives of this procedure and she is agreeable to proceed.  Try to schedule later this week. Follow-up with me afterward.  Shared Decision Making/Informed Consent The risks [stroke (1 in 1000), death (1 in 1000), kidney failure [usually temporary] (1 in 500), bleeding (1 in 200), allergic reaction [possibly serious] (1 in 200)], benefits (diagnostic support and management of coronary artery disease) and alternatives of a cardiac catheterization were discussed in detail with Bethany Wang and she is  willing to proceed.   Chrystie Nose, MD, [facility], FACP  [place]  [facility]  Medical Director of the Advanced Lipid Disorders &   Cardiovascular Risk Reduction Clinic Diplomate of the American Board of Clinical Lipidology Attending Cardiologist  Direct Dial: [phone]  Fax: [phone]  Website:  [email or web address] 10/01/2022, 2:43 PM

## 2022-10-01 NOTE — Telephone Encounter (Signed)
Tried to call patient at number given when she was in office 6/19, again directly to voicemail

## 2022-10-01 NOTE — Patient Instructions (Signed)
Medication Instructions:  STOP carvedilol   START metoprolol tartrate 100mg  twice daily  *If you need a refill on your cardiac medications before your next appointment, please call your pharmacy*   Follow-Up: At [facility], you and your health needs are our priority.  As part of our continuing mission to provide you with exceptional heart care, we have created designated Provider Care Teams.  These Care Teams include your primary Cardiologist (physician) and Advanced Practice Providers (APPs -  Physician Assistants and Nurse Practitioners) who all work together to provide you with the care you need, when you need it.  We recommend signing up for the patient portal called "MyChart".  Sign up information is provided on this After Visit Summary.  MyChart is used to connect with patients for Virtual Visits (Telemedicine).  Patients are able to view lab/test results, encounter notes, upcoming appointments, etc.  Non-urgent messages can be sent to your provider as well.   To learn more about what you can do with MyChart, go to [email or web address].    Your next appointment:    1-2 months with Dr. Rennis Golden

## 2022-10-28 ENCOUNTER — Encounter (INDEPENDENT_AMBULATORY_CARE_PROVIDER_SITE_OTHER): Payer: Self-pay | Admitting: Gastroenterology

## 2022-10-28 ENCOUNTER — Ambulatory Visit (INDEPENDENT_AMBULATORY_CARE_PROVIDER_SITE_OTHER): Payer: 59 | Admitting: Gastroenterology

## 2022-10-28 VITALS — BP 136/88 | HR 75 | Temp 98.8°F | Ht 64.0 in | Wt 148.5 lb

## 2022-10-28 DIAGNOSIS — R1084 Generalized abdominal pain: Secondary | ICD-10-CM | POA: Diagnosis not present

## 2022-10-28 DIAGNOSIS — K581 Irritable bowel syndrome with constipation: Secondary | ICD-10-CM

## 2022-10-28 DIAGNOSIS — R110 Nausea: Secondary | ICD-10-CM | POA: Diagnosis not present

## 2022-10-28 MED ORDER — PROCHLORPERAZINE MALEATE 10 MG PO TABS
10.0000 mg | ORAL_TABLET | Freq: Four times a day (QID) | ORAL | 1 refills | Status: AC | PRN
Start: 2022-10-28 — End: ?

## 2022-10-28 NOTE — Progress Notes (Signed)
Bethany Wang, M.D. Gastroenterology & Hepatology [facility] Gastroenterology [address] [place], [place] [address]  Primary Care Physician: Donetta Potts, MD [address] [place] [address]  I will communicate my assessment and recommendations to the referring MD via EMR.  Problems: Generalized abdominal pain IBS-C History of H. pylori status post Pylera SS Barrett's esophagus without dysplasia Gastric intestinal metaplasia   History of Present Illness: Bethany Wang is a 57 y.o. female with past medical history of breast cancer status post chemoradiation, coronary artery disease status post stent placement, brain tumor, depression, diabetes, hypertension, hyperlipidemia, stroke, seizures, who presents for follow up of nausea and abdominal pain.  The patient was last seen on 07/15/2022. At that time, the patient was advised to increase leases 2 to 90 mcg every day for management of constipation, as well as to add MiraLAX if needed for persistent constipation.  CBC, CMP and TSH were checked which were within normal limits.  Patient was scheduled for a esophagogastroduodenospy with findings described below.  Patient was prescribed omeprazole 40 mg every day.  Patient reports that 3-4 weeks ago she presented episodes of abdominal pain in the epigastric and lower abdominal area. The lower abdominal pain is present after she eats, but the epigastric pain is present all the time.   She has presented recurrent episodes of nausea, which she has had chronically. She reports that she has been vomiting frequently after eating. She could not tolerate Zofran and did not feel too much improvement with anti nausea medications. She is vomiting 3-4 times a week. Tries to avoid eating due to this.   She had a CT angio abdomen w and without contrast on 07/2020 which was negative for mesenteric ischemia.  The patient denies having any  fever, chills, hematochezia,  melena, hematemesis, abdominal distention, abdominal pain, diarrhea, jaundice, pruritus or weight loss.  Last EGD: 11/28/2019 One tongue of salmon- colored mucosa was present from 40 to 41 cm. No other visible abnormalities were present. The maximum longitudinal extent of these esophageal mucosal changes was 1 cm in length. Biopsies were taken with a cold forceps for histology.  Multiple localized small erosions with no stigmata of recent bleeding were found in the gastric antrum. Biopsies were taken with a cold forceps for Helicobacter pylori testing.  The examined duodenum was normal.  Path: A. STOMACH, BIOPSY:  - Gastric antral and oxyntic mucosa with mild chronic gastritis  - Intestinal metaplasia of antral mucosa, negative for dysplasia  - See comment   B. ESOPHAGUS, AT 40 CM, BIOPSY:  - Barretts esophagus, negative for dysplasia   Recommend a repeat EGD in 3 years  Last Colonoscopy: 01/05/2017 6 polyps were removed from the sigmoid colon (1 tubular adenoma, rest were hyperplastic polyps), external hemorrhoids Repeat in 7 years  Past Medical History: Past Medical History:  Diagnosis Date   Anemia    Anxiety    Bilateral breast cysts 12/30/2015   Brain tumor (HCC)    Breast cancer (HCC)    Left Breast Cancer   Breast disorder    BV (bacterial vaginosis) 09/09/2015   Coronary atherosclerosis of native coronary artery    a. s/p DES to RCA in 2013 b. DES to LAD in 2014 c. cath in 04/2016 showing patent LAD stent with D2 jailed by LAD stent and CTO of RCA with left to right collaterals present   Cyst of pharynx or nasopharynx    [name]   Decreased libido  12/25/2018   Depression    Diabetes mellitus without complication (HCC)    Essential hypertension    Falls    x 3-4 in past year   GERD (gastroesophageal reflux disease)    Headache    Heart attack (HCC)    x4   History of hematuria    Mixed hyperlipidemia    Personal history of chemotherapy     Left Breast Cancer   Personal history of radiation therapy    Left Breast Cancer   Pre-diabetes    PUD (peptic ulcer disease)    Seizures (HCC)    Shingles 09/09/2015   Stroke (HCC)    12-2017 on Plavix, only deficit is headaches   Suicide attempt (HCC)    3 attempts in remote past   Trichimoniasis 08/20/2019   Treated 08/20/19    Uterine cancer (HCC)    Vitamin D deficiency disease 04/03/2019    Past Surgical History: Past Surgical History:  Procedure Laterality Date   ABDOMINAL HYSTERECTOMY     BIOPSY  11/28/2019   Procedure: BIOPSY;  Surgeon: Malissa Hippo, MD;  [facility]: [facility];  Service: Endoscopy;;  antral, gastric body    BIOPSY  08/18/2022   Procedure: BIOPSY;  Surgeon: Dolores Frame, MD;  [facility]: [facility];  Service: Gastroenterology;;   BLADDER SURGERY     BREAST LUMPECTOMY Left    BREAST LUMPECTOMY Right 02/24/2018   Procedure: RIGHT BREAST LUMPECTOMY ERAS PATHWAY;  Surgeon: Griselda Miner, MD;  Location: [facility] OR;  Service: General;  Laterality: Right;   CARDIAC CATHETERIZATION N/A 05/10/2016   Procedure: Left Heart Cath and Coronary Angiography;  Surgeon: Lyn Records, MD;  Location: [facility] INVASIVE CV LAB;  Service: Cardiovascular;  Laterality: N/A;   CAUTERIZE INNER NOSE  07/13/2020   COLONOSCOPY  May 2010   Fleishman: normal rectum, internal hemorrhoids, , benign colonic polyp   COLONOSCOPY N/A 01/05/2017   Procedure: COLONOSCOPY;  Surgeon: Malissa Hippo, MD;  [facility]: [facility];  Service: Endoscopy;  Laterality: N/A;  1:00   CORONARY PRESSURE/FFR STUDY N/A 10/23/2020   Procedure: INTRAVASCULAR PRESSURE WIRE/FFR STUDY;  Surgeon: Lyn Records, MD;  Location: [facility] INVASIVE CV LAB;  Service: Cardiovascular;  Laterality: N/A;   CORONARY PRESSURE/FFR STUDY N/A 03/30/2021   Procedure: INTRAVASCULAR PRESSURE WIRE/FFR STUDY;  Surgeon: Yvonne Kendall, MD;  Location: [facility] INVASIVE CV LAB;  Service: Cardiovascular;  Laterality: N/A;   ESOPHAGEAL  DILATION  11/28/2019   Procedure: ESOPHAGEAL DILATION;  Surgeon: Malissa Hippo, MD;  [facility]: [facility];  Service: Endoscopy;;   ESOPHAGOGASTRODUODENOSCOPY     2001 Dr. Madilyn Fireman: distal esophagitis, small hiatal hernia,. Dr. Katrinka Wang 2006? no records available currently, pt also reports  EGD a few years ago with Dr. Jena Gauss, do not have these reports anywhere in medical records   ESOPHAGOGASTRODUODENOSCOPY  06/02/2011   [id number] pill impaction as described above s/p dilation of a probable cervical esophageal web/bx abnormal esophageal and gastric mucosa. + H.pylori gastritis    ESOPHAGOGASTRODUODENOSCOPY (EGD) WITH PROPOFOL N/A 11/28/2019   Procedure: ESOPHAGOGASTRODUODENOSCOPY (EGD) WITH PROPOFOL;  Surgeon: Malissa Hippo, MD;  [facility];  Service: Endoscopy;  Laterality: N/A;  1020   ESOPHAGOGASTRODUODENOSCOPY (EGD) WITH PROPOFOL N/A 08/18/2022   Procedure: ESOPHAGOGASTRODUODENOSCOPY (EGD) WITH PROPOFOL;  Surgeon: Dolores Frame, MD;  [facility]: [facility];  Service: Gastroenterology;  Laterality: N/A;  8:30AM;ASA 1   EYE SURGERY     Removed glass   HAND SURGERY Right  Left breast lumpectomy     Benign   LEFT HEART CATH AND CORONARY ANGIOGRAPHY N/A 10/25/2018   Procedure: LEFT HEART CATH AND CORONARY ANGIOGRAPHY;  Surgeon: Iran Ouch, MD;  Location: [facility] INVAS[facility] CV LAB;  Service: Cardiovascular;  Laterality: N/A;   LEFT HEART CATH AND CORONARY ANGIOGRAPHY N/A 10/23/2020   Procedure: LEFT HEART CATH AND CORONARY ANGIOGRAPHY;  Surgeon: Lyn Records, MD;  Location: [facility] INVASIVE CV LAB;  Service: Cardiovascular;  Laterality: N/A;   LEFT HEART CATH AND CORONARY ANGIOGRAPHY N/A 03/30/2021   Procedure: LEFT HEART CATH AND CORONARY ANGIOGRAPHY;  Surgeon: Yvonne Kendall, MD;  Location: [facility] INVASIVE CV LAB;  Service: Cardiovascular;  Laterality: N/A;   LEFT HEART CATH AND CORONARY ANGIOGRAPHY N/A 01/22/2022   Procedure: LEFT HEART CATH AND CORONARY ANGIOGRAPHY;   Surgeon: Tonny Bollman, MD;  Location: [facility] INVASIVE CV LAB;  Service: Cardiovascular;  Laterality: N/A;   LEFT HEART CATHETERIZATION WITH CORONARY ANGIOGRAM N/A 07/03/2014   Procedure: LEFT HEART CATHETERIZATION WITH CORONARY ANGIOGRAM;  Surgeon: Lyn Records, MD;  Location: [facility] CATH LAB;  Service: Cardiovascular;  Laterality: N/A;   POLYPECTOMY  01/05/2017   Procedure: POLYPECTOMY;  Surgeon: Malissa Hippo, MD;  [facility];  Service: Endoscopy;;  colon   SHOULDER SURGERY Left     Family History: Family History  Problem Relation Age of Onset   Colon cancer Paternal Grandfather    Cancer Father    Cancer Maternal Uncle    Alzheimer's disease Paternal Aunt    Cirrhosis Maternal Uncle    Cancer Paternal Aunt        lung   Aneurysm Mother        brain   Heart disease Brother    Heart attack Brother    Mental illness Daughter    ADD / ADHD Daughter    Bipolar disorder Daughter    Mental illness Son    ADD / ADHD Son    Bipolar disorder Son    Mental illness Son    ADD / ADHD Son    Bipolar disorder Son     Social History: Social History   Tobacco Use  Smoking Status Former   Current packs/day: 0.00   Average packs/day: 0.2 packs/day for 32.0 years (6.4 ttl pk-yrs)   Types: Cigarettes, Cigars   Start date: 07/05/1983   Quit date: 04/03/2012   Years since quitting: 10.5  Smokeless Tobacco Never   Social History   Substance and Sexual Activity  Alcohol Use No   Alcohol/week: 0.0 standard drinks of alcohol   Social History   Substance and Sexual Activity  Drug Use No    Allergies: Allergies  Allergen Reactions   Bee Venom Anaphylaxis and Other (See Comments)    "throat swelled and had to the hospital" - Yellow jacket sting   Lipitor [Atorvastatin] Rash and Other (See Comments)    Liver damage & Upper Abdominal pain, Break out like a rash . Per last hosp visit. It exposed on inpatient stay.   Penicillins Anaphylaxis and Other (See Comments)    Has  patient had a PCN reaction causing immediate rash, facial/tongue/throat swelling, SOB or lightheadedness with hypotension: Yes Has patient had a PCN reaction causing severe rash involving mucus membranes or skin necrosis: Yes Has patient had a PCN reaction that required hospitalization Yes Has patient had a PCN reaction occurring within the last 10 years: Yes If all of the above answers are "NO", then may proceed with Cephalosporin use. Patient  states she carries an epi-pen   Shellfish Allergy Swelling   Spironolactone Other (See Comments)    Nausea, dehydration   Tomato Itching and Swelling   Valsartan Itching   Zofran [Ondansetron] Itching   Crestor [Rosuvastatin] Rash and Other (See Comments)    Upper Abdominal pain   Ranolazine Rash    Medications: Current Outpatient Medications  Medication Sig Dispense Refill   albuterol (VENTOLIN HFA) 108 (90 Base) MCG/ACT inhaler Inhale 2 puffs into the lungs every 4 (four) hours as needed for wheezing or shortness of breath. 1 each 3   ALPRAZolam (XANAX) 0.5 MG tablet Take 1 tablet (0.5 mg total) by mouth 2 (two) times daily as needed for anxiety. 30 tablet 0   amLODipine (NORVASC) 10 MG tablet Take 1 tablet (10 mg total) by mouth every evening. (Patient taking differently: Take 10 mg by mouth daily in the afternoon.) 90 tablet 3   aspirin EC 81 MG tablet Take 1 tablet (81 mg total) by mouth daily. 180 tablet 1   blood glucose meter kit and supplies KIT Use to check blood sugar daily. 1 each 0   chlorthalidone (HYGROTON) 25 MG tablet Take 1 tablet (25 mg total) by mouth daily. 90 tablet 3   Cholecalciferol (DIALYVITE VITAMIN D 5000) 125 MCG (5000 UT) capsule Take 5,000 Units by mouth in the morning.     clopidogrel (PLAVIX) 75 MG tablet Take 75 mg by mouth in the morning.     diphenhydrAMINE (BENADRYL) 25 MG tablet Take 25 mg by mouth every 6 (six) hours as needed for allergies.     doxazosin (CARDURA) 2 MG tablet Take 1 tablet (2 mg total) by  mouth at bedtime. 30 tablet 2   escitalopram (LEXAPRO) 20 MG tablet Take 1 tablet (20 mg total) by mouth daily. (Patient taking differently: Take 20 mg by mouth daily with lunch.) 30 tablet 3   Evolocumab (REPATHA SURECLICK Cameron) Inject 140 mg into the skin every 14 (fourteen) days.     fluorometholone (FML) 0.1 % ophthalmic suspension Place 1 drop into both eyes daily.     fluticasone-salmeterol (ADVAIR) 100-50 MCG/ACT AEPB INHALE 1 PUFF BY MOUTH TWICE DAILY 60 each 10   isosorbide mononitrate (IMDUR) 60 MG 24 hr tablet Take 1 tablet (60 mg total) by mouth 2 (two) times daily. 60 tablet 5   linaclotide (LINZESS) 290 MCG CAPS capsule Take 1 capsule (290 mcg total) by mouth daily before breakfast. 90 capsule 3   metoprolol tartrate (LOPRESSOR) 100 MG tablet Take 1 tablet (100 mg total) by mouth 2 (two) times daily. 180 tablet 3   nitroGLYCERIN (NITROSTAT) 0.4 MG SL tablet DISSOLVE 1 TABLET UNDER THE TONGUE AS NEEDED FOR CHEST PAIN EVERY 5 MINUTES UP TO 3 TIMES. IF NO RELIEF CALL 911. 25 tablet 1   omeprazole (PRILOSEC) 40 MG capsule Take 1 capsule (40 mg total) by mouth daily. 90 capsule 3   potassium chloride SA (KLOR-CON M) 20 MEQ tablet Take 1 tablet (20 mEq total) by mouth 2 (two) times daily. 90 tablet 1   RESTASIS 0.05 % ophthalmic emulsion Place 1 drop into both eyes 2 (two) times daily.     traZODone (DESYREL) 50 MG tablet Take 50-100 mg by mouth at bedtime.     No current facility-administered medications for this visit.    Review of Systems: GENERAL: negative for malaise, night sweats HEENT: No changes in hearing or vision, no nose bleeds or other nasal problems. NECK: Negative for lumps, goiter, pain  and significant neck swelling RESPIRATORY: Negative for cough, wheezing CARDIOVASCULAR: Negative for chest pain, leg swelling, palpitations, orthopnea GI: SEE HPI MUSCULOSKELETAL: Negative for joint pain or swelling, back pain, and muscle pain. SKIN: Negative for lesions, rash PSYCH:  Negative for sleep disturbance, mood disorder and recent psychosocial stressors. HEMATOLOGY Negative for prolonged bleeding, bruising easily, and swollen nodes. ENDOCRINE: Negative for cold or heat intolerance, polyuria, polydipsia and goiter. NEURO: negative for tremor, gait imbalance, syncope and seizures. The remainder of the review of systems is noncontributory.   Physical Exam: BP 136/88 (BP Location: Left Arm, Patient Position: Sitting, Cuff Size: Normal)   Pulse 75   Temp 98.8 F (37.1 C) (Oral)   Ht 5\' 4"  (1.626 m)   Wt 148 lb 8 oz (67.4 kg)   BMI 25.49 kg/m  GENERAL: The patient is AO x3, in no acute distress. HEENT: Head is normocephalic and atraumatic. EOMI are intact. Mouth is well hydrated and without lesions. NECK: Supple. No masses LUNGS: Clear to auscultation. No presence of rhonchi/wheezing/rales. Adequate chest expansion HEART: RRR, normal s1 and s2. ABDOMEN: tender to palpation diffusely but no involuntary guarding, no peritoneal signs, and nondistended. BS +. No masses. EXTREMITIES: Without any cyanosis, clubbing, rash, lesions or edema. NEUROLOGIC: AOx3, no focal motor deficit. SKIN: no jaundice, no rashes  Imaging/Labs: as above  I personally reviewed and interpreted the available labs, imaging and endoscopic files.  Impression and Plan: Bethany Wang is a 57 y.o. female with past medical history of breast cancer status post chemoradiation, coronary artery disease status post stent placement, brain tumor, depression, diabetes, hypertension, hyperlipidemia, stroke, seizures, who presents for follow up of nausea and abdominal pain. Patient has presented persistent subacute abdominal pain without any other associated acute gastrointestinal symptoms. States all these symptoms have been new and have not been related to her chronic nausea. Notably, her last abdominal CT in 2022 did not show any abnormalities but she did not have the pain she is now endorsing. Hence,  we will repeat a CT now.  Regarding her chronic nausea, etiology may be central in nature. Has had multiple investigations in the past. Will check AM cortisol for now and may consider GES if unremarkable. She has failed multilple antiemetics, we will try Compazine for now.  - Schedule CT abdomen/pelvis with IV contrast - Start compazine as needed for nausea - Check AM Cortisol  All questions were answered.      Bethany Blazing, MD Gastroenterology and Hepatology [facility] Gastroenterology

## 2022-10-28 NOTE — Patient Instructions (Addendum)
Schedule CT abdomen/pelvis with IV contrast Start compazine as needed for nausea Perform blood workup in early AM

## 2022-10-29 ENCOUNTER — Other Ambulatory Visit: Payer: Self-pay | Admitting: Internal Medicine

## 2022-10-29 ENCOUNTER — Other Ambulatory Visit: Payer: Self-pay

## 2022-10-29 ENCOUNTER — Encounter: Payer: Self-pay | Admitting: Internal Medicine

## 2022-10-29 ENCOUNTER — Ambulatory Visit: Payer: 59 | Attending: Internal Medicine | Admitting: Internal Medicine

## 2022-10-29 VITALS — BP 146/84 | HR 63 | Ht 64.0 in | Wt 148.2 lb

## 2022-10-29 DIAGNOSIS — R0602 Shortness of breath: Secondary | ICD-10-CM

## 2022-10-29 DIAGNOSIS — E782 Mixed hyperlipidemia: Secondary | ICD-10-CM

## 2022-10-29 DIAGNOSIS — Z01812 Encounter for preprocedural laboratory examination: Secondary | ICD-10-CM | POA: Diagnosis not present

## 2022-10-29 DIAGNOSIS — R079 Chest pain, unspecified: Secondary | ICD-10-CM

## 2022-10-29 DIAGNOSIS — I351 Nonrheumatic aortic (valve) insufficiency: Secondary | ICD-10-CM

## 2022-10-29 DIAGNOSIS — R5383 Other fatigue: Secondary | ICD-10-CM

## 2022-10-29 NOTE — H&P (View-Only) (Signed)
Office Note  Chief Complaint:  Chest pressure, fatigue, shortness of breath with exertion, syncope  Primary Care Physician: Donetta Potts, MD  Primary Cardiologist:  Chrystie Nose, MD  HPI:  Bethany Wang is a 57 y.o. female who is being seen today for the evaluation of dyslipidemia at the request of Donetta Potts, MD. This is a pleasant 57 year old female that I do not recall seeing in the hospital but she remembers seeing me with a recent hospitalization.  She was hospitalized in July with chest pain and underwent left heart catheterization.  This demonstrated occlusion of the right coronary artery with the previously placed coronary stent and left to right collaterals.  There was a 50% in-stent restenosis of the proximal LAD and 90% diagonal ostial narrowing.  There is also 60 to 70% obtuse marginal narrowing and 50% distal circumflex disease.  Medical therapy was recommended.  She was referred today for management of dyslipidemia.  Unfortunately she was intolerant of statins including atorvastatin and rosuvastatin, both of which caused significant side effects.  Subsequently she had been switched by her primary care provider to Repatha and has already had a dose of the medication.  It is too early to look for response to this medication however I agree with this therapy.  More concerning, however today is symptoms of progressive chest pain.  She reports that ever since this past summer she has had worsening chest pain despite long-acting nitroglycerin.  She has been using short acting nitroglycerin very frequently and gets some minimal relief however it is short-lived.  EKG today shows normal sinus rhythm with some nonspecific T wave changes.  06/08/2021  Bethany Wang returns today for recurrent chest pain.  She has been in the emergency department twice this year including the other week with chest pain.  She said she had an episode when she was at church including  diaphoresis, shortness of breath and left arm pain.  She took nitroglycerin and it took several minutes to up to an hour to improve.  She also notes she gets short of breath and has chest discomfort when exerting herself.  She says she gets chest discomfort working in her garden.  I had sent her for cardiac catheterization in December 2022 for symptoms concerning for unstable angina.  Demonstrated moderate to severe multivessel coronary disease, similar to prior cath in July 2022 however FFR was negative of the mid LAD stent in OM1 lesion and a chronic total occlusion of the proximal RCA stent was again noted.  LVEDP was mildly elevated.  More antianginal therapy was recommended.  Initially she was switched from isosorbide 30 mg 3 times daily to 60 mg twice daily.  She says initially this helped somewhat but since then her symptoms have worsened.  She is now taking sublingual nitro almos  09/04/2021  Bethany Wang is seen today in follow-up.  She reports marked improvement in her symptoms.  Blood pressure is now much better controlled at 124/76.  It turns out she had a rash that was attributable to the Ranexa.  She stopped that.  She had been switched to Repatha which she is tolerating very well and has had a significant improvement in her cholesterol.  In February showed total cholesterol 123 (down from 216), triglycerides 72, HDL 52 and LDL 57.  Overall she is very pleased with how she is feeling.  She is still struggling with bilateral hip pain and has been working with Dr. Kevan Ny who is contemplating  sequential, bilateral hip replacement.  01/20/2022  Bethany Wang was an acute add-on visit today.  Apparently she was seen this morning while doing rehab by her primary care physician who is next-door.  She was noted to be tachycardic and in talking with her PCP he felt that she needed to be in the hospital.  Her blood pressure was quite elevated.  This had upset her.  She contacted [place] and was able to get in  to an appointment today.  In speaking with her, she says that she has had significant increase in the need for nitroglycerin over the past several weeks.  In fact she is taking it about every other day in addition to her long-acting nitrates.  Her last heart catheterization in 2022 did show moderate to severe stenosis of the LAD (in-stent), and there is chronic total occlusion of the proximal RCA stent with left to right collaterals.  FFR was nonsignificant and no intervention was performed.  Increased antianginal therapy was recommended.  Despite this, it seems that her symptoms continue to worsen.  She has had intermittent episodes of tachycardia.  She had monitoring over the summer which showed some episodes of nonsustained VT.  Her beta-blocker was titrated.  10/29/2022  Bethany Wang returns today for follow-up.  She is again experiencing episodes of shortness of breath and chest pressure with exertion.  She has had progressive fatigue and decreased exercise tolerance.  She tells me she had a syncopal episode.  There does not appear to be any warning prior to this.  She said afterwards her daughter took her blood pressure and it measured 60/30.  Eventually she said it resolved.  She says she cannot do any other normal activities that she wanted to do before.  She feels like her heart races often.  She does feel little bit better after going back to metoprolol twice daily.  EKG today shows normal sinus rhythm and is unchanged compared to her prior EKG in June.  She does have known coronary disease with collaterals and CTO of the RCA.  This was last assessed by cath in 2023 however since then she says she has continued to feel worse.  She was noted to have moderate to severe aortic insufficiency by echo recently with normal LV function.  It may be possible that her symptoms are due to heart failure related to valvular insufficiency.  She is on some chlorthalidone.  Blood pressure is better controlled than it  has been in the past.  She also reports a nocturnal cough, however she has been having GI issues including reflux and associated symptoms.  PMHx:  Past Medical History:  Diagnosis Date   Anemia    Anxiety    Bilateral breast cysts 12/30/2015   Brain tumor (HCC)    Breast cancer (HCC)    Left Breast Cancer   Breast disorder    BV (bacterial vaginosis) 09/09/2015   Coronary atherosclerosis of native coronary artery    a. s/p DES to RCA in 2013 b. DES to LAD in 2014 c. cath in 04/2016 showing patent LAD stent with D2 jailed by LAD stent and CTO of RCA with left to right collaterals present   Cyst of pharynx or nasopharynx    [name]   Decreased libido 12/25/2018   Depression    Diabetes mellitus without complication (HCC)    Essential hypertension    Falls    x 3-4 in past year   GERD (gastroesophageal reflux disease)  Headache    Heart attack (HCC)    x4   History of hematuria    Mixed hyperlipidemia    Personal history of chemotherapy    Left Breast Cancer   Personal history of radiation therapy    Left Breast Cancer   Pre-diabetes    PUD (peptic ulcer disease)    Seizures (HCC)    Shingles 09/09/2015   Stroke (HCC)    12-2017 on Plavix, only deficit is headaches   Suicide attempt (HCC)    3 attempts in remote past   Trichimoniasis 08/20/2019   Treated 08/20/19    Uterine cancer (HCC)    Vitamin D deficiency disease 04/03/2019    Past Surgical History:  Procedure Laterality Date   ABDOMINAL HYSTERECTOMY     BIOPSY  11/28/2019   Procedure: BIOPSY;  Surgeon: Malissa Hippo, MD;  [facility];  Service: Endoscopy;;  antral, gastric body    BIOPSY  08/18/2022   Procedure: BIOPSY;  Surgeon: Dolores Frame, MD;  [facility];  Service: Gastroenterology;;   BLADDER SURGERY     BREAST LUMPECTOMY Left    BREAST LUMPECTOMY Right 02/24/2018   Procedure: RIGHT BREAST LUMPECTOMY ERAS PATHWAY;  Surgeon: Griselda Miner,  MD;  [facility];  Service: General;  Laterality: Right;   CARDIAC CATHETERIZATION N/A 05/10/2016   Procedure: Left Heart Cath and Coronary Angiography;  Surgeon: Lyn Records, MD;  Location: [facility] INVASIVE CV LAB;  Service: Cardiovascular;  Laterality: N/A;   CAUTERIZE INNER NOSE  07/13/2020   COLONOSCOPY  May 2010   Fleishman: normal rectum, internal hemorrhoids, , benign colonic polyp   COLONOSCOPY N/A 01/05/2017   Procedure: COLONOSCOPY;  Surgeon: Malissa Hippo, MD;  [facility];  Service: Endoscopy;  Laterality: N/A;  1:00   CORONARY PRESSURE/FFR STUDY N/A 10/23/2020   Procedure: INTRAVASCULAR PRESSURE WIRE/FFR STUDY;  Surgeon: Lyn Records, MD;  Location: [facility] LAB;  Service: Cardiovascular;  Laterality: N/A;   CORONARY PRESSURE/FFR STUDY N/A 03/30/2021   Procedure: INTRAVASCULAR PRESSURE WIRE/FFR STUDY;  Surgeon: Yvonne Kendall, MD;  Location: [facility] INVAS[facility] CV LAB;  Service: Cardiovascular;  Laterality: N/A;   ESOPHAGEAL DILATION  11/28/2019   Procedure: ESOPHAGEAL DILATION;  Surgeon: Malissa Hippo, MD;  [facility];  Service: Endoscopy;;   ESOPHAGOGASTRODUODENOSCOPY     2001 Dr. Madilyn Fireman: distal esophagitis, small hiatal hernia,. Dr. Katrinka Blazing 2006? no records available currently, pt also reports  EGD a few years ago with Dr. Jena Gauss, do not have these reports anywhere in medical records   ESOPHAGOGASTRODUODENOSCOPY  06/02/2011   GLO:VFIEPP pill impaction as described above s/p dilation of a probable cervical esophageal web/bx abnormal esophageal and gastric mucosa. + H.pylori gastritis    ESOPHAGOGASTRODUODENOSCOPY (EGD) WITH PROPOFOL N/A 11/28/2019   Procedure: ESOPHAGOGASTRODUODENOSCOPY (EGD) WITH PROPOFOL;  Surgeon: Malissa Hippo, MD;  [facility];  Service: Endoscopy;  Laterality: N/A;  1020   ESOPHAGOGASTRODUODENOSCOPY (EGD) WITH PROPOFOL N/A 08/18/2022   Procedure: ESOPHAGOGASTRODUODENOSCOPY (EGD) WITH PROPOFOL;  Surgeon: Dolores Frame, MD;  [facility];  Service: Gastroenterology;  Laterality: N/A;  8:30AM;ASA 1   EYE SURGERY     Removed glass   HAND SURGERY Right    Left breast lumpectomy     Benign   LEFT HEART CATH AND CORONARY ANGIOGRAPHY N/A 10/25/2018   Procedure: LEFT HEART CATH AND CORONARY ANGIOGRAPHY;  Surgeon: Iran Ouch, MD;  [facility];  Service: Cardiovascular;  Laterality: N/A;   LEFT HEART CATH AND CORONARY ANGIOGRAPHY N/A 10/23/2020   Procedure: LEFT HEART CATH AND CORONARY ANGIOGRAPHY;  Surgeon: Lyn Records, MD;  [facility]: [facility];  Service: Cardiovascular;  Laterality: N/A;   LEFT HEART CATH AND CORONARY ANGIOGRAPHY N/A 03/30/2021   Procedure: LEFT HEART CATH AND CORONARY ANGIOGRAPHY;  Surgeon: Yvonne Kendall, MD;  [facility]: [facility];  Service: Cardiovascular;  Laterality: N/A;   LEFT HEART CATH AND CORONARY ANGIOGRAPHY N/A 01/22/2022   Procedure: LEFT HEART CATH AND CORONARY ANGIOGRAPHY;  Surgeon: Tonny Bollman, MD;  Location: [facility] INVASIVE CV LAB;  Service: Cardiovascular;  Laterality: N/A;   LEFT HEART CATHETERIZATION WITH CORONARY ANGIOGRAM N/A 07/03/2014   Procedure: LEFT HEART CATHETERIZATION WITH CORONARY ANGIOGRAM;  Surgeon: Lyn Records, MD;  Location: [facility] CATH LAB;  Service: Cardiovascular;  Laterality: N/A;   POLYPECTOMY  01/05/2017   Procedure: POLYPECTOMY;  Surgeon: Malissa Hippo, MD;  [facility];  Service: Endoscopy;;  colon   SHOULDER SURGERY Left     FAMHx:  Family History  Problem Relation Age of Onset   Colon cancer Paternal Grandfather    Cancer Father    Cancer Maternal Uncle    Alzheimer's disease Paternal Aunt    Cirrhosis Maternal Uncle    Cancer Paternal Aunt        lung   Aneurysm Mother        brain   Heart disease Brother    Heart attack Brother    Mental illness Daughter    ADD / ADHD Daughter    Bipolar disorder Daughter    Mental illness Son    ADD / ADHD Son    Bipolar  disorder Son    Mental illness Son    ADD / ADHD Son    Bipolar disorder Son     SOCHx:   reports that she quit smoking about 10 years ago. Her smoking use included cigarettes and cigars. She started smoking about 39 years ago. She has a 6.4 pack-year smoking history. She has never used smokeless tobacco. She reports that she does not drink alcohol and does not use drugs.  ALLERGIES:  Allergies  Allergen Reactions   Bee Venom Anaphylaxis and Other (See Comments)    "throat swelled and had to the hospital" - Yellow jacket sting   Lipitor [Atorvastatin] Rash and Other (See Comments)    Liver damage & Upper Abdominal pain, Break out like a rash . Per last hosp visit. It exposed on inpatient stay.   Penicillins Anaphylaxis and Other (See Comments)    Has patient had a PCN reaction causing immediate rash, facial/tongue/throat swelling, SOB or lightheadedness with hypotension: Yes Has patient had a PCN reaction causing severe rash involving mucus membranes or skin necrosis: Yes Has patient had a PCN reaction that required hospitalization Yes Has patient had a PCN reaction occurring within the last 10 years: Yes If all of the above answers are "NO", then may proceed with Cephalosporin use. Patient states she carries an epi-pen   Shellfish Allergy Swelling   Spironolactone Other (See Comments)    Nausea, dehydration   Tomato Itching and Swelling   Valsartan Itching   Zofran [Ondansetron] Itching   Crestor [Rosuvastatin] Rash and Other (See Comments)    Upper Abdominal pain   Ranolazine Rash    ROS: Pertinent items noted in HPI and remainder of comprehensive ROS otherwise negative.  HOME MEDS: Current Outpatient Medications on File Prior  to Visit  Medication Sig Dispense Refill   albuterol (VENTOLIN HFA) 108 (90 Base) MCG/ACT inhaler Inhale 2 puffs into the lungs every 4 (four) hours as needed for wheezing or shortness of breath. 1 each 3   ALPRAZolam (XANAX) 0.5 MG tablet Take 1  tablet (0.5 mg total) by mouth 2 (two) times daily as needed for anxiety. 30 tablet 0   amLODipine (NORVASC) 10 MG tablet Take 1 tablet (10 mg total) by mouth every evening. (Patient taking differently: Take 10 mg by mouth daily in the afternoon.) 90 tablet 3   aspirin EC 81 MG tablet Take 1 tablet (81 mg total) by mouth daily. 180 tablet 1   blood glucose meter kit and supplies KIT Use to check blood sugar daily. 1 each 0   chlorthalidone (HYGROTON) 25 MG tablet Take 1 tablet (25 mg total) by mouth daily. 90 tablet 3   Cholecalciferol (DIALYVITE VITAMIN D 5000) 125 MCG (5000 UT) capsule Take 5,000 Units by mouth in the morning.     clopidogrel (PLAVIX) 75 MG tablet Take 75 mg by mouth in the morning.     diphenhydrAMINE (BENADRYL) 25 MG tablet Take 25 mg by mouth every 6 (six) hours as needed for allergies.     doxazosin (CARDURA) 2 MG tablet Take 1 tablet (2 mg total) by mouth at bedtime. 30 tablet 2   escitalopram (LEXAPRO) 20 MG tablet Take 1 tablet (20 mg total) by mouth daily. (Patient taking differently: Take 20 mg by mouth daily with lunch.) 30 tablet 3   Evolocumab (REPATHA SURECLICK [place]) Inject 140 mg into the skin every 14 (fourteen) days.     fluorometholone (FML) 0.1 % ophthalmic suspension Place 1 drop into both eyes daily.     fluticasone-salmeterol (ADVAIR) 100-50 MCG/ACT AEPB INHALE 1 PUFF BY MOUTH TWICE DAILY 60 each 10   isosorbide mononitrate (IMDUR) 60 MG 24 hr tablet Take 1 tablet (60 mg total) by mouth 2 (two) times daily. 60 tablet 5   linaclotide (LINZESS) 290 MCG CAPS capsule Take 1 capsule (290 mcg total) by mouth daily before breakfast. 90 capsule 3   metoprolol tartrate (LOPRESSOR) 100 MG tablet Take 1 tablet (100 mg total) by mouth 2 (two) times daily. 180 tablet 3   nitroGLYCERIN (NITROSTAT) 0.4 MG SL tablet DISSOLVE 1 TABLET UNDER THE TONGUE AS NEEDED FOR CHEST PAIN EVERY 5 MINUTES UP TO 3 TIMES. IF NO RELIEF CALL 911. 25 tablet 1   omeprazole (PRILOSEC) 40 MG capsule  Take 1 capsule (40 mg total) by mouth daily. 90 capsule 3   potassium chloride SA (KLOR-CON M) 20 MEQ tablet Take 1 tablet (20 mEq total) by mouth 2 (two) times daily. 90 tablet 1   prochlorperazine (COMPAZINE) 10 MG tablet Take 1 tablet (10 mg total) by mouth every 6 (six) hours as needed for nausea or vomiting. 90 tablet 1   RESTASIS 0.05 % ophthalmic emulsion Place 1 drop into both eyes 2 (two) times daily.     traZODone (DESYREL) 50 MG tablet Take 50-100 mg by mouth at bedtime.     No current facility-administered medications on file prior to visit.    LABS/IMAGING: No results found for this or any previous visit (from the past 48 hour(s)). No results found.  LIPID PANEL:    Component Value Date/Time   CHOL 153 01/20/2022 1446   TRIG 85 01/20/2022 1446   HDL 74 01/20/2022 1446   CHOLHDL 2.1 01/20/2022 1446   VLDL 17 01/20/2022 1446  LDLCALC 62 01/20/2022 1446   LDLCALC 146 (H) 07/03/2020 1334    WEIGHTS: Wt Readings from Last 3 Encounters:  10/29/22 148 lb 3.2 oz (67.2 kg)  10/28/22 148 lb 8 oz (67.4 kg)  10/01/22 150 lb 12.8 oz (68.4 kg)    VITALS: BP (!) 146/84   Pulse 63   Ht 5\' 4"  (1.626 m)   Wt 148 lb 3.2 oz (67.2 kg)   SpO2 95%   BMI 25.44 kg/m   EXAM: General appearance: alert and no distress Neck: no carotid bruit, no JVD, and thyroid not enlarged, symmetric, no tenderness/mass/nodules Lungs: clear to auscultation bilaterally Heart: regular rate and rhythm, S1, S2 normal, and diastolic murmur: mid diastolic 2/6, blowing at apex Abdomen: soft, non-tender; bowel sounds normal; no masses,  no organomegaly Extremities: extremities normal, atraumatic, no cyanosis or edema Pulses: 2+ and symmetric Skin: Skin color, texture, turgor normal. No rashes or lesions Neurologic: Alert and oriented X 3, normal strength and tone. Normal symmetric reflexes. Normal coordination and gait Psych: Pleasant  EKG: Sinus rhythm at 63, minimal voltage criteria for  LVH-unchanged compared to prior EKG, personally reviewed  ASSESSMENT: Chest pain Progressive DOE/Fatigue Palpitations/SVT/syncope Coronary artery disease with prior PCI and occluded right coronary with left-to-right collaterals, 50% diffuse LAD stenosis (10/2020) -repeat cardiac catheterization 01/2022, unchanged, medical therapy recommended Mixed dyslipidemia, goal LDL less than 70 Statin allergic - hives, now on Repatha Hypertension Moderate to severe AI (present since at least 2020 by echo)  PLAN: 1.   Bethany Wang continues to have progressive chest pain, dyspnea exertion and fatigue.  She had a syncopal episode and said that her blood pressure dropped to 60/30.  EKG however appears normal.  Blood pressure is better controlled.  She seems to be tolerating the change in her metoprolol and her heart rate is lower.  That being said she may have had some progression of her coronary artery disease.  She also has moderate to severe AI which could be causing her heart failure symptoms.  I would like for her to go have a repeat left heart cath as well as a right heart cath and evaluation for the degree of AI and as to whether or not she has elevated filling pressures that may need additional treatment.  On exam she did not appear volume overloaded.  She does report some nocturnal cough this may be related to reflux since it got a little better with tea and honey.  Plan follow-up with me afterwards.  We scheduled her for heart catheterization on July 24.  CARDIAC CATHETERIZATION CONSENT   The procedure with Risks/Benefits/Alternatives and Indications was reviewed with the patient .All questions were answered.    Risks / Complications include, but not limited to: Death, MI, CVA/TIA, VF/VT (with defibrillation), Bradycardia (need for temporary pacer placement), contrast induced nephropathy, bleeding / bruising / hematoma / pseudoaneurysm, vascular or coronary injury (with possible emergent CT or Vascular  Surgery), adverse medication reactions, infection.    The patient voices understanding and agreed to proceed.    Chrystie Nose, MD, [facility], FACP  [place]  [facility]  Medical Director of the Advanced Lipid Disorders &   Cardiovascular Risk Reduction Clinic Diplomate of the American Board of Clinical Lipidology Attending Cardiologist  Direct Dial: [phone]  Fax: [phone]  Website:  [email or web address] Nicely Chastin Garlitz 10/29/2022, 3:22 PM

## 2022-10-29 NOTE — Patient Instructions (Addendum)
Medication Instructions:  NO CHANGES  NO Nitroglycerin if your systolic (top number of BP) is less than 100  *If you need a refill on your cardiac medications before your next appointment, please call your pharmacy*   Lab Work: Lipid Panel, BNP, CBC, BMET next week with labs ordered by GI  If you have labs (blood work) drawn today and your tests are completely normal, you will receive your results only by: MyChart Message (if you have MyChart) OR A paper copy in the mail If you have any lab test that is abnormal or we need to change your treatment, we will call you to review the results.   Testing/Procedures: Right and Left Heart Cath at [facility]   Follow-Up: At [facility], you and your health needs are our priority.  As part of our continuing mission to provide you with exceptional heart care, we have created designated Provider Care Teams.  These Care Teams include your primary Cardiologist (physician) and Advanced Practice Providers (APPs -  Physician Assistants and Nurse Practitioners) who all work together to provide you with the care you need, when you need it.  We recommend signing up for the patient portal called "MyChart".  Sign up information is provided on this After Visit Summary.  MyChart is used to connect with patients for Virtual Visits (Telemedicine).  Patients are able to view lab/test results, encounter notes, upcoming appointments, etc.  Non-urgent messages can be sent to your provider as well.   To learn more about what you can do with MyChart, go to [email or web address].    Your next appointment:    4 weeks with Dr. Rennis Golden or NP/PA  Other Instructions  [facility] A DEPT OF [place]. [facility] AT [facility] [address] [place] [address] Dept: [phone] Loc: [phone]  Bethany Wang  10/29/2022  You are scheduled for a Cardiac Catheterization on Wednesday, July  24 with Dr. Verne Carrow.  1. Please arrive at the [facility] (Main Entrance A) at [facility]: [address] [place], [place] [address] at 11:00 AM (This time is 2 hour(s) before your procedure to ensure your preparation). Free valet parking service is available. You will check in at ADMITTING. The support person will be asked to wait in the waiting room.  It is OK to have someone drop you off and come back when you are ready to be discharged.    Special note: Every effort is made to have your procedure done on time. Please understand that emergencies sometimes delay scheduled procedures.  2. Diet: Do not eat solid foods after midnight.  The patient may have clear liquids until 5am upon the day of the procedure.  3. Labs: next week with GI labs -- you will need to be fasting  4. Medication instructions in preparation for your procedure:  On the morning of your procedure, take your Aspirin 81 mg and any morning medicines NOT listed above.  You may use sips of water.  5. Plan to go home the same day, you will only stay overnight if medically necessary. 6. Bring a current list of your medications and current insurance cards. 7. You MUST have a responsible person to drive you home. 8. Someone MUST be with you the first 24 hours after you arrive home or your discharge will be delayed. 9. Please wear clothes that are easy to get on and off and wear slip-on shoes.  Thank you  for allowing [place] to care for you!   -- [facility]

## 2022-10-29 NOTE — Progress Notes (Addendum)
Office Note  Chief Complaint:  Chest pressure, fatigue, shortness of breath with exertion, syncope  Primary Care Physician: Donetta Potts, MD  Primary Cardiologist:  Chrystie Nose, MD  HPI:  Bethany Wang is a 57 y.o. female who is being seen today for the evaluation of dyslipidemia at the request of Donetta Potts, MD. This is a pleasant 57 year old female that I do not recall seeing in the hospital but she remembers seeing me with a recent hospitalization.  She was hospitalized in July with chest pain and underwent left heart catheterization.  This demonstrated occlusion of the right coronary artery with the previously placed coronary stent and left to right collaterals.  There was a 50% in-stent restenosis of the proximal LAD and 90% diagonal ostial narrowing.  There is also 60 to 70% obtuse marginal narrowing and 50% distal circumflex disease.  Medical therapy was recommended.  She was referred today for management of dyslipidemia.  Unfortunately she was intolerant of statins including atorvastatin and rosuvastatin, both of which caused significant side effects.  Subsequently she had been switched by her primary care provider to Repatha and has already had a dose of the medication.  It is too early to look for response to this medication however I agree with this therapy.  More concerning, however today is symptoms of progressive chest pain.  She reports that ever since this past summer she has had worsening chest pain despite long-acting nitroglycerin.  She has been using short acting nitroglycerin very frequently and gets some minimal relief however it is short-lived.  EKG today shows normal sinus rhythm with some nonspecific T wave changes.  06/08/2021  Bethany Wang returns today for recurrent chest pain.  She has been in the emergency department twice this year including the other week with chest pain.  She said she had an episode when she was at church including  diaphoresis, shortness of breath and left arm pain.  She took nitroglycerin and it took several minutes to up to an hour to improve.  She also notes she gets short of breath and has chest discomfort when exerting herself.  She says she gets chest discomfort working in her garden.  I had sent her for cardiac catheterization in December 2022 for symptoms concerning for unstable angina.  Demonstrated moderate to severe multivessel coronary disease, similar to prior cath in July 2022 however FFR was negative of the mid LAD stent in OM1 lesion and a chronic total occlusion of the proximal RCA stent was again noted.  LVEDP was mildly elevated.  More antianginal therapy was recommended.  Initially she was switched from isosorbide 30 mg 3 times daily to 60 mg twice daily.  She says initially this helped somewhat but since then her symptoms have worsened.  She is now taking sublingual nitro almos  09/04/2021  Bethany Wang is seen today in follow-up.  She reports marked improvement in her symptoms.  Blood pressure is now much better controlled at 124/76.  It turns out she had a rash that was attributable to the Ranexa.  She stopped that.  She had been switched to Repatha which she is tolerating very well and has had a significant improvement in her cholesterol.  In February showed total cholesterol 123 (down from 216), triglycerides 72, HDL 52 and LDL 57.  Overall she is very pleased with how she is feeling.  She is still struggling with bilateral hip pain and has been working with Dr. Kevan Ny who is contemplating  sequential, bilateral hip replacement.  01/20/2022  Bethany Wang was an acute add-on visit today.  Apparently she was seen this morning while doing rehab by her primary care physician who is next-door.  She was noted to be tachycardic and in talking with her PCP he felt that she needed to be in the hospital.  Her blood pressure was quite elevated.  This had upset her.  She contacted [place] and was able to get in  to an appointment today.  In speaking with her, she says that she has had significant increase in the need for nitroglycerin over the past several weeks.  In fact she is taking it about every other day in addition to her long-acting nitrates.  Her last heart catheterization in 2022 did show moderate to severe stenosis of the LAD (in-stent), and there is chronic total occlusion of the proximal RCA stent with left to right collaterals.  FFR was nonsignificant and no intervention was performed.  Increased antianginal therapy was recommended.  Despite this, it seems that her symptoms continue to worsen.  She has had intermittent episodes of tachycardia.  She had monitoring over the summer which showed some episodes of nonsustained VT.  Her beta-blocker was titrated.  10/29/2022  Bethany Wang returns today for follow-up.  She is again experiencing episodes of shortness of breath and chest pressure with exertion.  She has had progressive fatigue and decreased exercise tolerance.  She tells me she had a syncopal episode.  There does not appear to be any warning prior to this.  She said afterwards her daughter took her blood pressure and it measured 60/30.  Eventually she said it resolved.  She says she cannot do any other normal activities that she wanted to do before.  She feels like her heart races often.  She does feel little bit better after going back to metoprolol twice daily.  EKG today shows normal sinus rhythm and is unchanged compared to her prior EKG in June.  She does have known coronary disease with collaterals and CTO of the RCA.  This was last assessed by cath in 2023 however since then she says she has continued to feel worse.  She was noted to have moderate to severe aortic insufficiency by echo recently with normal LV function.  It may be possible that her symptoms are due to heart failure related to valvular insufficiency.  She is on some chlorthalidone.  Blood pressure is better controlled than it  has been in the past.  She also reports a nocturnal cough, however she has been having GI issues including reflux and associated symptoms.  PMHx:  Past Medical History:  Diagnosis Date   Anemia    Anxiety    Bilateral breast cysts 12/30/2015   Brain tumor (HCC)    Breast cancer (HCC)    Left Breast Cancer   Breast disorder    BV (bacterial vaginosis) 09/09/2015   Coronary atherosclerosis of native coronary artery    a. s/p DES to RCA in 2013 b. DES to LAD in 2014 c. cath in 04/2016 showing patent LAD stent with D2 jailed by LAD stent and CTO of RCA with left to right collaterals present   Cyst of pharynx or nasopharynx    [name]   Decreased libido 12/25/2018   Depression    Diabetes mellitus without complication (HCC)    Essential hypertension    Falls    x 3-4 in past year   GERD (gastroesophageal reflux disease)  Headache    Heart attack (HCC)    x4   History of hematuria    Mixed hyperlipidemia    Personal history of chemotherapy    Left Breast Cancer   Personal history of radiation therapy    Left Breast Cancer   Pre-diabetes    PUD (peptic ulcer disease)    Seizures (HCC)    Shingles 09/09/2015   Stroke (HCC)    12-2017 on Plavix, only deficit is headaches   Suicide attempt (HCC)    3 attempts in remote past   Trichimoniasis 08/20/2019   Treated 08/20/19    Uterine cancer (HCC)    Vitamin D deficiency disease 04/03/2019    Past Surgical History:  Procedure Laterality Date   ABDOMINAL HYSTERECTOMY     BIOPSY  11/28/2019   Procedure: BIOPSY;  Surgeon: Malissa Hippo, MD;  [facility]: [facility];  Service: Endoscopy;;  antral, gastric body    BIOPSY  08/18/2022   Procedure: BIOPSY;  Surgeon: Dolores Frame, MD;  [facility]: [facility];  Service: Gastroenterology;;   BLADDER SURGERY     BREAST LUMPECTOMY Left    BREAST LUMPECTOMY Right 02/24/2018   Procedure: RIGHT BREAST LUMPECTOMY ERAS PATHWAY;  Surgeon: Griselda Miner,  MD;  [facility] OR;  Service: General;  Laterality: Right;   CARDIAC CATHETERIZATION N/A 05/10/2016   Procedure: Left Heart Cath and Coronary Angiography;  Surgeon: Lyn Records, MD;  Location: [facility] INVASIVE CV LAB;  Service: Cardiovascular;  Laterality: N/A;   CAUTERIZE INNER NOSE  07/13/2020   COLONOSCOPY  May 2010   Fleishman: normal rectum, internal hemorrhoids, , benign colonic polyp   COLONOSCOPY N/A 01/05/2017   Procedure: COLONOSCOPY;  Surgeon: Malissa Hippo, MD;  [facility]: [facility];  Service: Endoscopy;  Laterality: N/A;  1:00   CORONARY PRESSURE/FFR STUDY N/A 10/23/2020   Procedure: INTRAVASCULAR PRESSURE WIRE/FFR STUDY;  Surgeon: Lyn Records, MD;  Location: [facility] INVASIVE CV LAB;  Service: Cardiovascular;  Laterality: N/A;   CORONARY PRESSURE/FFR STUDY N/A 03/30/2021   Procedure: INTRAVASCULAR PRESSURE WIRE/FFR STUDY;  Surgeon: Yvonne Kendall, MD;  Location: [facility] INVASIVE CV LAB;  Service: Cardiovascular;  Laterality: N/A;   ESOPHAGEAL DILATION  11/28/2019   Procedure: ESOPHAGEAL DILATION;  Surgeon: Malissa Hippo, MD;  [facility];  Service: Endoscopy;;   ESOPHAGOGASTRODUODENOSCOPY     2001 Dr. Madilyn Fireman: distal esophagitis, small hiatal hernia,. Dr. Katrinka Blazing 2006? no records available currently, pt also reports  EGD a few years ago with Dr. Jena Gauss, do not have these reports anywhere in medical records   ESOPHAGOGASTRODUODENOSCOPY  06/02/2011   GLO:VFIEPP pill impaction as described above s/p dilation of a probable cervical esophageal web/bx abnormal esophageal and gastric mucosa. + H.pylori gastritis    ESOPHAGOGASTRODUODENOSCOPY (EGD) WITH PROPOFOL N/A 11/28/2019   Procedure: ESOPHAGOGASTRODUODENOSCOPY (EGD) WITH PROPOFOL;  Surgeon: Malissa Hippo, MD;  [facility];  Service: Endoscopy;  Laterality: N/A;  1020   ESOPHAGOGASTRODUODENOSCOPY (EGD) WITH PROPOFOL N/A 08/18/2022   Procedure: ESOPHAGOGASTRODUODENOSCOPY (EGD) WITH PROPOFOL;  Surgeon: Dolores Frame, MD;  [facility];  Service: Gastroenterology;  Laterality: N/A;  8:30AM;ASA 1   EYE SURGERY     Removed glass   HAND SURGERY Right    Left breast lumpectomy     Benign   LEFT HEART CATH AND CORONARY ANGIOGRAPHY N/A 10/25/2018   Procedure: LEFT HEART CATH AND CORONARY ANGIOGRAPHY;  Surgeon: Iran Ouch, MD;  Location: [facility] LAB;  Service: Cardiovascular;  Laterality: N/A;   LEFT HEART CATH AND CORONARY ANGIOGRAPHY N/A 10/23/2020   Procedure: LEFT HEART CATH AND CORONARY ANGIOGRAPHY;  Surgeon: Lyn Records, MD;  Location: [facility] CV LAB;  Service: Cardiovascular;  Laterality: N/A;   LEFT HEART CATH AND CORONARY ANGIOGRAPHY N/A 03/30/2021   Procedure: LEFT HEART CATH AND CORONARY ANGIOGRAPHY;  Surgeon: Yvonne Kendall, MD;  Location: [facility] CV LAB;  Service: Cardiovascular;  Laterality: N/A;   LEFT HEART CATH AND CORONARY ANGIOGRAPHY N/A 01/22/2022   Procedure: LEFT HEART CATH AND CORONARY ANGIOGRAPHY;  Surgeon: Tonny Bollman, MD;  Location: [facility] INVASIVE CV LAB;  Service: Cardiovascular;  Laterality: N/A;   LEFT HEART CATHETERIZATION WITH CORONARY ANGIOGRAM N/A 07/03/2014   Procedure: LEFT HEART CATHETERIZATION WITH CORONARY ANGIOGRAM;  Surgeon: Lyn Records, MD;  Location: [facility] CATH LAB;  Service: Cardiovascular;  Laterality: N/A;   POLYPECTOMY  01/05/2017   Procedure: POLYPECTOMY;  Surgeon: Malissa Hippo, MD;  [facility];  Service: Endoscopy;;  colon   SHOULDER SURGERY Left     FAMHx:  Family History  Problem Relation Age of Onset   Colon cancer Paternal Grandfather    Cancer Father    Cancer Maternal Uncle    Alzheimer's disease Paternal Aunt    Cirrhosis Maternal Uncle    Cancer Paternal Aunt        lung   Aneurysm Mother        brain   Heart disease Brother    Heart attack Brother    Mental illness Daughter    ADD / ADHD Daughter    Bipolar disorder Daughter    Mental illness Son    ADD / ADHD Son    Bipolar  disorder Son    Mental illness Son    ADD / ADHD Son    Bipolar disorder Son     SOCHx:   reports that she quit smoking about 10 years ago. Her smoking use included cigarettes and cigars. She started smoking about 39 years ago. She has a 6.4 pack-year smoking history. She has never used smokeless tobacco. She reports that she does not drink alcohol and does not use drugs.  ALLERGIES:  Allergies  Allergen Reactions   Bee Venom Anaphylaxis and Other (See Comments)    "throat swelled and had to the hospital" - Yellow jacket sting   Lipitor [Atorvastatin] Rash and Other (See Comments)    Liver damage & Upper Abdominal pain, Break out like a rash . Per last hosp visit. It exposed on inpatient stay.   Penicillins Anaphylaxis and Other (See Comments)    Has patient had a PCN reaction causing immediate rash, facial/tongue/throat swelling, SOB or lightheadedness with hypotension: Yes Has patient had a PCN reaction causing severe rash involving mucus membranes or skin necrosis: Yes Has patient had a PCN reaction that required hospitalization Yes Has patient had a PCN reaction occurring within the last 10 years: Yes If all of the above answers are "NO", then may proceed with Cephalosporin use. Patient states she carries an epi-pen   Shellfish Allergy Swelling   Spironolactone Other (See Comments)    Nausea, dehydration   Tomato Itching and Swelling   Valsartan Itching   Zofran [Ondansetron] Itching   Crestor [Rosuvastatin] Rash and Other (See Comments)    Upper Abdominal pain   Ranolazine Rash    ROS: Pertinent items noted in HPI and remainder of comprehensive ROS otherwise negative.  HOME MEDS: Current Outpatient Medications on File Prior  to Visit  Medication Sig Dispense Refill   albuterol (VENTOLIN HFA) 108 (90 Base) MCG/ACT inhaler Inhale 2 puffs into the lungs every 4 (four) hours as needed for wheezing or shortness of breath. 1 each 3   ALPRAZolam (XANAX) 0.5 MG tablet Take 1  tablet (0.5 mg total) by mouth 2 (two) times daily as needed for anxiety. 30 tablet 0   amLODipine (NORVASC) 10 MG tablet Take 1 tablet (10 mg total) by mouth every evening. (Patient taking differently: Take 10 mg by mouth daily in the afternoon.) 90 tablet 3   aspirin EC 81 MG tablet Take 1 tablet (81 mg total) by mouth daily. 180 tablet 1   blood glucose meter kit and supplies KIT Use to check blood sugar daily. 1 each 0   chlorthalidone (HYGROTON) 25 MG tablet Take 1 tablet (25 mg total) by mouth daily. 90 tablet 3   Cholecalciferol (DIALYVITE VITAMIN D 5000) 125 MCG (5000 UT) capsule Take 5,000 Units by mouth in the morning.     clopidogrel (PLAVIX) 75 MG tablet Take 75 mg by mouth in the morning.     diphenhydrAMINE (BENADRYL) 25 MG tablet Take 25 mg by mouth every 6 (six) hours as needed for allergies.     doxazosin (CARDURA) 2 MG tablet Take 1 tablet (2 mg total) by mouth at bedtime. 30 tablet 2   escitalopram (LEXAPRO) 20 MG tablet Take 1 tablet (20 mg total) by mouth daily. (Patient taking differently: Take 20 mg by mouth daily with lunch.) 30 tablet 3   Evolocumab (REPATHA SURECLICK [place]) Inject 140 mg into the skin every 14 (fourteen) days.     fluorometholone (FML) 0.1 % ophthalmic suspension Place 1 drop into both eyes daily.     fluticasone-salmeterol (ADVAIR) 100-50 MCG/ACT AEPB INHALE 1 PUFF BY MOUTH TWICE DAILY 60 each 10   isosorbide mononitrate (IMDUR) 60 MG 24 hr tablet Take 1 tablet (60 mg total) by mouth 2 (two) times daily. 60 tablet 5   linaclotide (LINZESS) 290 MCG CAPS capsule Take 1 capsule (290 mcg total) by mouth daily before breakfast. 90 capsule 3   metoprolol tartrate (LOPRESSOR) 100 MG tablet Take 1 tablet (100 mg total) by mouth 2 (two) times daily. 180 tablet 3   nitroGLYCERIN (NITROSTAT) 0.4 MG SL tablet DISSOLVE 1 TABLET UNDER THE TONGUE AS NEEDED FOR CHEST PAIN EVERY 5 MINUTES UP TO 3 TIMES. IF NO RELIEF CALL 911. 25 tablet 1   omeprazole (PRILOSEC) 40 MG capsule  Take 1 capsule (40 mg total) by mouth daily. 90 capsule 3   potassium chloride SA (KLOR-CON M) 20 MEQ tablet Take 1 tablet (20 mEq total) by mouth 2 (two) times daily. 90 tablet 1   prochlorperazine (COMPAZINE) 10 MG tablet Take 1 tablet (10 mg total) by mouth every 6 (six) hours as needed for nausea or vomiting. 90 tablet 1   RESTASIS 0.05 % ophthalmic emulsion Place 1 drop into both eyes 2 (two) times daily.     traZODone (DESYREL) 50 MG tablet Take 50-100 mg by mouth at bedtime.     No current facility-administered medications on file prior to visit.    LABS/IMAGING: No results found for this or any previous visit (from the past 48 hour(s)). No results found.  LIPID PANEL:    Component Value Date/Time   CHOL 153 01/20/2022 1446   TRIG 85 01/20/2022 1446   HDL 74 01/20/2022 1446   CHOLHDL 2.1 01/20/2022 1446   VLDL 17 01/20/2022 1446  LDLCALC 62 01/20/2022 1446   LDLCALC 146 (H) 07/03/2020 1334    WEIGHTS: Wt Readings from Last 3 Encounters:  10/29/22 148 lb 3.2 oz (67.2 kg)  10/28/22 148 lb 8 oz (67.4 kg)  10/01/22 150 lb 12.8 oz (68.4 kg)    VITALS: BP (!) 146/84   Pulse 63   Ht 5\' 4"  (1.626 m)   Wt 148 lb 3.2 oz (67.2 kg)   SpO2 95%   BMI 25.44 kg/m   EXAM: General appearance: alert and no distress Neck: no carotid bruit, no JVD, and thyroid not enlarged, symmetric, no tenderness/mass/nodules Lungs: clear to auscultation bilaterally Heart: regular rate and rhythm, S1, S2 normal, and diastolic murmur: mid diastolic 2/6, blowing at apex Abdomen: soft, non-tender; bowel sounds normal; no masses,  no organomegaly Extremities: extremities normal, atraumatic, no cyanosis or edema Pulses: 2+ and symmetric Skin: Skin color, texture, turgor normal. No rashes or lesions Neurologic: Alert and oriented X 3, normal strength and tone. Normal symmetric reflexes. Normal coordination and gait Psych: Pleasant  EKG: Sinus rhythm at 63, minimal voltage criteria for  LVH-unchanged compared to prior EKG, personally reviewed  ASSESSMENT: Chest pain Progressive DOE/Fatigue Palpitations/SVT/syncope Coronary artery disease with prior PCI and occluded right coronary with left-to-right collaterals, 50% diffuse LAD stenosis (10/2020) -repeat cardiac catheterization 01/2022, unchanged, medical therapy recommended Mixed dyslipidemia, goal LDL less than 70 Statin allergic - hives, now on Repatha Hypertension Moderate to severe AI (present since at least 2020 by echo)  PLAN: 1.   Bethany Wang continues to have progressive chest pain, dyspnea exertion and fatigue.  She had a syncopal episode and said that her blood pressure dropped to 60/30.  EKG however appears normal.  Blood pressure is better controlled.  She seems to be tolerating the change in her metoprolol and her heart rate is lower.  That being said she may have had some progression of her coronary artery disease.  She also has moderate to severe AI which could be causing her heart failure symptoms.  I would like for her to go have a repeat left heart cath as well as a right heart cath and evaluation for the degree of AI and as to whether or not she has elevated filling pressures that may need additional treatment.  On exam she did not appear volume overloaded.  She does report some nocturnal cough this may be related to reflux since it got a little better with tea and honey.  Plan follow-up with me afterwards.  We scheduled her for heart catheterization on July 24.  CARDIAC CATHETERIZATION CONSENT   The procedure with Risks/Benefits/Alternatives and Indications was reviewed with the patient .All questions were answered.    Risks / Complications include, but not limited to: Death, MI, CVA/TIA, VF/VT (with defibrillation), Bradycardia (need for temporary pacer placement), contrast induced nephropathy, bleeding / bruising / hematoma / pseudoaneurysm, vascular or coronary injury (with possible emergent CT or Vascular  Surgery), adverse medication reactions, infection.    The patient voices understanding and agreed to proceed.    Chrystie Nose, MD, [facility], FACP  [place]  [facility]  Medical Director of the [facility]   Card[facility]ovascular Risk Reduction Clinic Diplomate of the American Board of Clinical Lipidology Attending Cardiologist  Direct Dial: [phone]  Fax: [phone]  Website:  [email or web address]  10/29/2022, 3:22 PM

## 2022-11-01 ENCOUNTER — Other Ambulatory Visit (HOSPITAL_COMMUNITY)
Admission: RE | Admit: 2022-11-01 | Discharge: 2022-11-01 | Disposition: A | Discharge status: Home / Self Care | Payer: 59 | Source: Ambulatory Visit | Attending: Gastroenterology | Admitting: Gastroenterology

## 2022-11-01 ENCOUNTER — Other Ambulatory Visit (HOSPITAL_COMMUNITY)
Admission: RE | Admit: 2022-11-01 | Discharge: 2022-11-01 | Disposition: A | Discharge status: Home / Self Care | Payer: 59 | Source: Ambulatory Visit | Attending: Internal Medicine | Admitting: Internal Medicine

## 2022-11-01 DIAGNOSIS — R0602 Shortness of breath: Secondary | ICD-10-CM | POA: Insufficient documentation

## 2022-11-01 DIAGNOSIS — E782 Mixed hyperlipidemia: Secondary | ICD-10-CM | POA: Insufficient documentation

## 2022-11-01 DIAGNOSIS — Z01812 Encounter for preprocedural laboratory examination: Secondary | ICD-10-CM | POA: Insufficient documentation

## 2022-11-01 DIAGNOSIS — R079 Chest pain, unspecified: Secondary | ICD-10-CM | POA: Insufficient documentation

## 2022-11-01 DIAGNOSIS — R1084 Generalized abdominal pain: Secondary | ICD-10-CM | POA: Insufficient documentation

## 2022-11-01 DIAGNOSIS — R110 Nausea: Secondary | ICD-10-CM | POA: Insufficient documentation

## 2022-11-01 LAB — LIPID PANEL
Cholesterol: 127 mg/dL (ref 0–200)
HDL: 54 mg/dL (ref >40)
LDL Cholesterol: 61 mg/dL (ref 0–99)
Total CHOL/HDL Ratio: 2.4 RATIO
Triglycerides: 62 mg/dL (ref <150)
VLDL: 12 mg/dL (ref 0–40)

## 2022-11-01 LAB — CBC
HCT: 38.5 % (ref 36.0–46.0)
Hemoglobin: 12.9 g/dL (ref 12.0–15.0)
MCH: 28 pg (ref 26.0–34.0)
MCHC: 33.5 g/dL (ref 30.0–36.0)
MCV: 83.7 fL (ref 80.0–100.0)
Platelets: 208 10*3/uL (ref 150–400)
RBC: 4.6 MIL/uL (ref 3.87–5.11)
RDW: 14.1 % (ref 11.5–15.5)
WBC: 5.9 10*3/uL (ref 4.0–10.5)
nRBC: 0 % (ref 0.0–0.2)

## 2022-11-01 LAB — BASIC METABOLIC PANEL
Anion gap: 6 (ref 5–15)
BUN: 8 mg/dL (ref 6–20)
CO2: 24 mmol/L (ref 22–32)
Calcium: 8.9 mg/dL (ref 8.9–10.3)
Chloride: 107 mmol/L (ref 98–111)
Creatinine, Ser: 0.7 mg/dL (ref 0.44–1.00)
GFR, Estimated: >60 mL/min (ref >60)
Glucose, Bld: 99 mg/dL (ref 70–99)
Potassium: 3.8 mmol/L (ref 3.5–5.1)
Sodium: 137 mmol/L (ref 135–145)

## 2022-11-01 LAB — BRAIN NATRIURETIC PEPTIDE: B Natriuretic Peptide: 77 pg/mL (ref 0.0–100.0)

## 2022-11-01 LAB — CORTISOL: Cortisol, Plasma: 10.3 ug/dL

## 2022-11-02 ENCOUNTER — Encounter (INDEPENDENT_AMBULATORY_CARE_PROVIDER_SITE_OTHER): Payer: Self-pay

## 2022-11-02 ENCOUNTER — Telehealth: Payer: Self-pay | Admitting: *Deleted

## 2022-11-02 NOTE — Telephone Encounter (Signed)
Left detailed voicemail message (DPR) with procedure instructions for patient-[phone].

## 2022-11-02 NOTE — Telephone Encounter (Signed)
Cardiac Catheterization scheduled at [facility] for: Wednesday November 03, 2022 1 PM Arrival time [facility] Main Entrance A at: 11 AM  Nothing to eat after midnight prior to procedure, clear liquids until 5 AM day of procedure.  Medication instructions: -Hold:  Chlorthalidone/KCl-AM of procedure -Other usual morning medications can be taken with sips of water including aspirin 81 mg and Plavix 75 mg.  Confirmed patient has responsible adult to drive home post procedure and be with patient first 24 hours after arriving home.  Plan to go home the same day, you will only stay overnight if medically necessary.  Left message for patient to call back to review procedure instructions.

## 2022-11-03 ENCOUNTER — Ambulatory Visit (HOSPITAL_COMMUNITY)
Admission: RE | Disposition: A | Discharge status: Home / Self Care | Payer: Self-pay | Source: Home / Self Care | Attending: Cardiovascular Disease

## 2022-11-03 ENCOUNTER — Ambulatory Visit (HOSPITAL_COMMUNITY)
Admission: RE | Admit: 2022-11-03 | Discharge: 2022-11-03 | Disposition: A | Discharge status: Home / Self Care | Payer: 59 | Attending: Cardiovascular Disease | Admitting: Cardiovascular Disease

## 2022-11-03 ENCOUNTER — Other Ambulatory Visit: Payer: Self-pay

## 2022-11-03 DIAGNOSIS — R5383 Other fatigue: Secondary | ICD-10-CM

## 2022-11-03 DIAGNOSIS — E782 Mixed hyperlipidemia: Secondary | ICD-10-CM | POA: Diagnosis not present

## 2022-11-03 DIAGNOSIS — R072 Precordial pain: Secondary | ICD-10-CM | POA: Insufficient documentation

## 2022-11-03 DIAGNOSIS — Z79899 Other long term (current) drug therapy: Secondary | ICD-10-CM | POA: Diagnosis not present

## 2022-11-03 DIAGNOSIS — R0602 Shortness of breath: Secondary | ICD-10-CM

## 2022-11-03 DIAGNOSIS — Z955 Presence of coronary angioplasty implant and graft: Secondary | ICD-10-CM | POA: Insufficient documentation

## 2022-11-03 DIAGNOSIS — T82855A Stenosis of coronary artery stent, initial encounter: Secondary | ICD-10-CM | POA: Insufficient documentation

## 2022-11-03 DIAGNOSIS — R55 Syncope and collapse: Secondary | ICD-10-CM | POA: Diagnosis not present

## 2022-11-03 DIAGNOSIS — Y832 Surgical operation with anastomosis, bypass or graft as the cause of abnormal reaction of the patient, or of later complication, without mention of misadventure at the time of the procedure: Secondary | ICD-10-CM | POA: Insufficient documentation

## 2022-11-03 DIAGNOSIS — I351 Nonrheumatic aortic (valve) insufficiency: Secondary | ICD-10-CM | POA: Diagnosis not present

## 2022-11-03 DIAGNOSIS — I2582 Chronic total occlusion of coronary artery: Secondary | ICD-10-CM | POA: Diagnosis not present

## 2022-11-03 DIAGNOSIS — R002 Palpitations: Secondary | ICD-10-CM | POA: Diagnosis not present

## 2022-11-03 DIAGNOSIS — I2510 Atherosclerotic heart disease of native coronary artery without angina pectoris: Secondary | ICD-10-CM | POA: Diagnosis not present

## 2022-11-03 DIAGNOSIS — R0609 Other forms of dyspnea: Secondary | ICD-10-CM | POA: Diagnosis not present

## 2022-11-03 DIAGNOSIS — I471 Supraventricular tachycardia, unspecified: Secondary | ICD-10-CM | POA: Insufficient documentation

## 2022-11-03 DIAGNOSIS — Z87891 Personal history of nicotine dependence: Secondary | ICD-10-CM | POA: Diagnosis not present

## 2022-11-03 DIAGNOSIS — R079 Chest pain, unspecified: Secondary | ICD-10-CM

## 2022-11-03 DIAGNOSIS — I10 Essential (primary) hypertension: Secondary | ICD-10-CM | POA: Insufficient documentation

## 2022-11-03 HISTORY — PX: RIGHT/LEFT HEART CATH AND CORONARY ANGIOGRAPHY: CATH118266

## 2022-11-03 LAB — POCT I-STAT EG7
Acid-base deficit: 5 mmol/L — ABNORMAL HIGH (ref 0.0–2.0)
Bicarbonate: 20.7 mmol/L (ref 20.0–28.0)
Calcium, Ion: 1.15 mmol/L (ref 1.15–1.40)
HCT: 37 % (ref 36.0–46.0)
Hemoglobin: 12.6 g/dL (ref 12.0–15.0)
O2 Saturation: 63 %
Potassium: 3.3 mmol/L — ABNORMAL LOW (ref 3.5–5.1)
Sodium: 133 mmol/L — ABNORMAL LOW (ref 135–145)
TCO2: 22 mmol/L (ref 22–32)
pCO2, Ven: 40.6 mmHg — ABNORMAL LOW (ref 44–60)
pH, Ven: 7.316 (ref 7.25–7.43)
pO2, Ven: 36 mmHg (ref 32–45)

## 2022-11-03 LAB — POCT I-STAT 7, (LYTES, BLD GAS, ICA,H+H)
Acid-base deficit: 3 mmol/L — ABNORMAL HIGH (ref 0.0–2.0)
Bicarbonate: 21.4 mmol/L (ref 20.0–28.0)
Calcium, Ion: 1.16 mmol/L (ref 1.15–1.40)
HCT: 37 % (ref 36.0–46.0)
Hemoglobin: 12.6 g/dL (ref 12.0–15.0)
O2 Saturation: 95 %
Potassium: 3.6 mmol/L (ref 3.5–5.1)
Sodium: 141 mmol/L (ref 135–145)
TCO2: 22 mmol/L (ref 22–32)
pCO2 arterial: 36.5 mmHg (ref 32–48)
pH, Arterial: 7.376 (ref 7.35–7.45)
pO2, Arterial: 75 mmHg — ABNORMAL LOW (ref 83–108)

## 2022-11-03 LAB — GLUCOSE, CAPILLARY
Glucose-Capillary: 90 mg/dL (ref 70–99)
Glucose-Capillary: 82 mg/dL (ref 70–99)
Glucose-Capillary: 90 mg/dL (ref 70–99)

## 2022-11-03 SURGERY — RIGHT/LEFT HEART CATH AND CORONARY ANGIOGRAPHY
Anesthesia: LOCAL

## 2022-11-03 MED ORDER — SODIUM CHLORIDE 0.9% FLUSH: 3.0000 mL | INTRAVENOUS | Status: DC | PRN

## 2022-11-03 MED ORDER — MIDAZOLAM HCL 2 MG/2ML IJ SOLN
INTRAMUSCULAR | Status: AC
  Filled 2022-11-03: qty 2

## 2022-11-03 MED ORDER — LABETALOL HCL 5 MG/ML IV SOLN: 10.0000 mg | INTRAVENOUS | Status: DC | PRN

## 2022-11-03 MED ORDER — HYDRALAZINE HCL 20 MG/ML IJ SOLN
INTRAMUSCULAR | Status: AC
  Administered 2022-11-03: 10 mg via INTRAVENOUS
  Filled 2022-11-03: qty 1

## 2022-11-03 MED ORDER — HEPARIN SODIUM (PORCINE) 1000 UNIT/ML IJ SOLN
INTRAMUSCULAR | Status: AC
  Filled 2022-11-03: qty 10

## 2022-11-03 MED ORDER — HYDRALAZINE HCL 20 MG/ML IJ SOLN
INTRAMUSCULAR | Status: DC | PRN
  Administered 2022-11-03: 10 mg via INTRAVENOUS

## 2022-11-03 MED ORDER — SODIUM CHLORIDE 0.9 % WEIGHT BASED INFUSION
3.0000 mL/kg/h | INTRAVENOUS | Status: AC
  Administered 2022-11-03: 3 mL/kg/h via INTRAVENOUS

## 2022-11-03 MED ORDER — FENTANYL CITRATE (PF) 100 MCG/2ML IJ SOLN
INTRAMUSCULAR | Status: DC | PRN
  Administered 2022-11-03: 50 ug via INTRAVENOUS
  Administered 2022-11-03: 25 ug via INTRAVENOUS

## 2022-11-03 MED ORDER — VERAPAMIL HCL 2.5 MG/ML IV SOLN
INTRAVENOUS | Status: AC
  Filled 2022-11-03: qty 2

## 2022-11-03 MED ORDER — SODIUM CHLORIDE 0.9 % IV SOLN: INTRAVENOUS | Status: DC

## 2022-11-03 MED ORDER — SODIUM CHLORIDE 0.9 % IV SOLN: 250.0000 mL | INTRAVENOUS | Status: DC | PRN

## 2022-11-03 MED ORDER — IOHEXOL 350 MG/ML SOLN
INTRAVENOUS | Status: DC | PRN
  Administered 2022-11-03: 33 mL

## 2022-11-03 MED ORDER — LIDOCAINE HCL (PF) 1 % IJ SOLN
INTRAMUSCULAR | Status: DC | PRN
  Administered 2022-11-03: 12 mL
  Administered 2022-11-03: 2 mL

## 2022-11-03 MED ORDER — FENTANYL CITRATE (PF) 100 MCG/2ML IJ SOLN
INTRAMUSCULAR | Status: AC
  Filled 2022-11-03: qty 2

## 2022-11-03 MED ORDER — SODIUM CHLORIDE 0.9% FLUSH: 3.0000 mL | Freq: Two times a day (BID) | INTRAVENOUS | Status: DC

## 2022-11-03 MED ORDER — ASPIRIN 81 MG PO CHEW: 81.0000 mg | CHEWABLE_TABLET | ORAL | Status: DC

## 2022-11-03 MED ORDER — SODIUM CHLORIDE 0.9 % WEIGHT BASED INFUSION: 1.0000 mL/kg/h | INTRAVENOUS | Status: DC

## 2022-11-03 MED ORDER — ACETAMINOPHEN 325 MG PO TABS: 650.0000 mg | ORAL_TABLET | ORAL | Status: DC | PRN

## 2022-11-03 MED ORDER — HEPARIN (PORCINE) IN NACL 1000-0.9 UT/500ML-% IV SOLN
INTRAVENOUS | Status: DC | PRN
  Administered 2022-11-03 (×2): 500 mL

## 2022-11-03 MED ORDER — HYDRALAZINE HCL 20 MG/ML IJ SOLN
INTRAMUSCULAR | Status: AC
  Filled 2022-11-03: qty 1

## 2022-11-03 MED ORDER — HYDRALAZINE HCL 20 MG/ML IJ SOLN: 10.0000 mg | INTRAMUSCULAR | Status: DC | PRN

## 2022-11-03 MED ORDER — MIDAZOLAM HCL 2 MG/2ML IJ SOLN
INTRAMUSCULAR | Status: DC | PRN
  Administered 2022-11-03: 1 mg via INTRAVENOUS
  Administered 2022-11-03: 2 mg via INTRAVENOUS

## 2022-11-03 MED ORDER — LIDOCAINE HCL (PF) 1 % IJ SOLN
INTRAMUSCULAR | Status: AC
  Filled 2022-11-03: qty 30

## 2022-11-03 SURGICAL SUPPLY — 14 items
CATH BALLN WEDGE 5F 110CM (CATHETERS) IMPLANT
CATH INFINITI 5FR MULTPACK ANG (CATHETERS) IMPLANT
CLOSURE MYNX CONTROL 5F (Vascular Products) IMPLANT
GUIDEWIRE .025 260CM (WIRE) IMPLANT
KIT MICROPUNCTURE NIT STIFF (SHEATH) IMPLANT
KIT SINGLE USE MANIFOLD (KITS) IMPLANT
PACK CARDIAC CATHETERIZATION (CUSTOM PROCEDURE TRAY) ×1 IMPLANT
PROTECTION STATION PRESSURIZED (MISCELLANEOUS) ×1
SET ATX-X65L (MISCELLANEOUS) IMPLANT
SHEATH GLIDE SLENDER 4/5FR (SHEATH) IMPLANT
SHEATH PINNACLE 5F 10CM (SHEATH) IMPLANT
SHEATH PROBE COVER 6X72 (BAG) IMPLANT
STATION PROTECTION PRESSURIZED (MISCELLANEOUS) IMPLANT
WIRE EMERALD 3MM-J .035X150CM (WIRE) IMPLANT

## 2022-11-03 NOTE — Interval H&P Note (Signed)
History and Physical Interval Note:  11/03/2022 1:08 PM  Bethany Wang  has presented today for surgery, with the diagnosis of chest pain, shortness of breath, fatigue, Aortic Valve insuffiiency.  The various methods of treatment have been discussed with the patient and family. After consideration of risks, benefits and other options for treatment, the patient has consented to  Procedure(s): RIGHT/LEFT HEART CATH AND CORONARY ANGIOGRAPHY (N/A) as a surgical intervention.  The patient's history has been reviewed, patient examined, no change in status, stable for surgery.  I have reviewed the patient's chart and labs.  Questions were answered to the patient's satisfaction.    Cath Lab Visit (complete for each Cath Lab visit)  Clinical Evaluation Leading to the Procedure:   ACS: No.  Non-ACS:    Anginal Classification: CCS III  Anti-ischemic medical therapy: Maximal Therapy (2 or more classes of medications)  Non-Invasive Test Results: No non-invasive testing performed  Prior CABG: No previous CABG        Verne Carrow

## 2022-11-03 NOTE — Progress Notes (Signed)
Patient walked to the bathroom with no difficulties, Groin site still level 0 clean, dry, and intact.

## 2022-11-04 ENCOUNTER — Encounter (HOSPITAL_COMMUNITY): Payer: Self-pay | Admitting: Cardiovascular Disease

## 2022-11-04 MED FILL — Verapamil HCl IV Soln 2.5 MG/ML: INTRAVENOUS | Qty: 2 | Status: AC

## 2022-11-05 ENCOUNTER — Emergency Department (HOSPITAL_COMMUNITY)
Admission: EM | Admit: 2022-11-05 | Discharge: 2022-11-05 | Disposition: A | Discharge status: Home / Self Care | Payer: 59 | Attending: Emergency Medicine | Admitting: Emergency Medicine

## 2022-11-05 ENCOUNTER — Telehealth: Payer: Self-pay | Admitting: Internal Medicine

## 2022-11-05 ENCOUNTER — Emergency Department (HOSPITAL_BASED_OUTPATIENT_CLINIC_OR_DEPARTMENT_OTHER): Payer: 59

## 2022-11-05 ENCOUNTER — Encounter (HOSPITAL_COMMUNITY): Payer: Self-pay | Admitting: Emergency Medicine

## 2022-11-05 ENCOUNTER — Other Ambulatory Visit: Payer: Self-pay

## 2022-11-05 DIAGNOSIS — M79604 Pain in right leg: Secondary | ICD-10-CM | POA: Insufficient documentation

## 2022-11-05 DIAGNOSIS — I2510 Atherosclerotic heart disease of native coronary artery without angina pectoris: Secondary | ICD-10-CM | POA: Insufficient documentation

## 2022-11-05 DIAGNOSIS — Z79899 Other long term (current) drug therapy: Secondary | ICD-10-CM | POA: Insufficient documentation

## 2022-11-05 DIAGNOSIS — Z7902 Long term (current) use of antithrombotics/antiplatelets: Secondary | ICD-10-CM | POA: Insufficient documentation

## 2022-11-05 DIAGNOSIS — R791 Abnormal coagulation profile: Secondary | ICD-10-CM | POA: Diagnosis not present

## 2022-11-05 DIAGNOSIS — Z7982 Long term (current) use of aspirin: Secondary | ICD-10-CM | POA: Diagnosis not present

## 2022-11-05 DIAGNOSIS — I724 Aneurysm of artery of lower extremity: Secondary | ICD-10-CM | POA: Diagnosis not present

## 2022-11-05 DIAGNOSIS — I10 Essential (primary) hypertension: Secondary | ICD-10-CM | POA: Diagnosis not present

## 2022-11-05 LAB — COMPREHENSIVE METABOLIC PANEL
ALT: 22 U/L (ref 0–44)
AST: 32 U/L (ref 15–41)
Albumin: 3.9 g/dL (ref 3.5–5.0)
Alkaline Phosphatase: 124 U/L (ref 38–126)
Anion gap: 11 (ref 5–15)
BUN: 15 mg/dL (ref 6–20)
CO2: 22 mmol/L (ref 22–32)
Calcium: 9.5 mg/dL (ref 8.9–10.3)
Chloride: 105 mmol/L (ref 98–111)
Creatinine, Ser: 0.81 mg/dL (ref 0.44–1.00)
GFR, Estimated: >60 mL/min (ref >60)
Glucose, Bld: 103 mg/dL — ABNORMAL HIGH (ref 70–99)
Potassium: 3.6 mmol/L (ref 3.5–5.1)
Sodium: 138 mmol/L (ref 135–145)
Total Bilirubin: 1 mg/dL (ref 0.3–1.2)
Total Protein: 7.6 g/dL (ref 6.5–8.1)

## 2022-11-05 LAB — CBC
HCT: 40.8 % (ref 36.0–46.0)
Hemoglobin: 13.5 g/dL (ref 12.0–15.0)
MCH: 27.8 pg (ref 26.0–34.0)
MCHC: 33.1 g/dL (ref 30.0–36.0)
MCV: 84 fL (ref 80.0–100.0)
Platelets: 241 10*3/uL (ref 150–400)
RBC: 4.86 MIL/uL (ref 3.87–5.11)
RDW: 13.9 % (ref 11.5–15.5)
WBC: 7.6 10*3/uL (ref 4.0–10.5)
nRBC: 0 % (ref 0.0–0.2)

## 2022-11-05 LAB — PROTIME-INR
INR: 0.9 (ref 0.8–1.2)
Prothrombin Time: 12.8 seconds (ref 11.4–15.2)

## 2022-11-05 LAB — D-DIMER, QUANTITATIVE: D-Dimer, Quant: 0.98 ug/mL-FEU — ABNORMAL HIGH (ref 0.00–0.50)

## 2022-11-05 MED ORDER — OXYCODONE HCL 5 MG PO TABS
10.0000 mg | ORAL_TABLET | Freq: Once | ORAL | Status: AC
  Administered 2022-11-05: 10 mg via ORAL
  Filled 2022-11-05: qty 2

## 2022-11-05 MED ORDER — HYDROCODONE-ACETAMINOPHEN 5-325 MG PO TABS
1.0000 | ORAL_TABLET | Freq: Once | ORAL | Status: AC
  Administered 2022-11-05: 1 via ORAL
  Filled 2022-11-05: qty 1

## 2022-11-05 MED ORDER — GABAPENTIN 100 MG PO CAPS: 100.0000 mg | ORAL_CAPSULE | Freq: Three times a day (TID) | ORAL | 0 refills | Status: AC

## 2022-11-05 NOTE — Progress Notes (Signed)
Lower extremity right groin pseudoaneurysm check study completed.  Preliminary results relayed to Doran Durand, MD and Carlynn Purl, MD.   See CV Proc for preliminary results report.   Jean Rosenthal, RDMS, RVT

## 2022-11-05 NOTE — Telephone Encounter (Signed)
Pt states that the left side of where she had Cath done (on Wednesday) has started to swell, turn a "purple" looking color as well as she's in pain. She would like a callback regarding this matter. Please advise

## 2022-11-05 NOTE — Progress Notes (Signed)
Patient discussed with ER staff. Presents with right groin/leg pain after cath 11/03/22. Patient required a vas [place] groin pseudoaneurysm right side. I have been told by ER staff this test is not available at [facility] and only at [facility], if that is the case would require having study done at [facility]. [place] would evaluate for possible pseudoaneurysm and hematoma, if benign symptoms may be femoral nerve irritation post cath.   Dominga Ferry MD

## 2022-11-05 NOTE — Discharge Instructions (Addendum)
You were seen today for right leg pain. Please take gabapentin as directed for neuropathic pain and follow up with your cardiologist as soon as possible.

## 2022-11-05 NOTE — ED Triage Notes (Addendum)
Pt via POV c/o right leg pain, swelling, and discoloration since this morning. Pt had a double catheterization 2 days ago with entry points in right groin and right AC. Now her entire right leg is sore, her right foot is numb, and she is very tender to light palpation. Her cardiologist advised her to come directly to ER to rule out a blood clot. Denies SOB/CP. No warmth or swelling noted. Pt takes plavix and 2 baby aspirin daily.

## 2022-11-05 NOTE — ED Provider Notes (Signed)
[facility] Provider Note   CSN: [id number] Arrival date & time: 11/05/22  1235     History  Chief Complaint  Patient presents with   Leg Pain    Bethany Wang is a 57 y.o. female.  Patient is a 57 year old woman with history significant for CAD, aortic valve insufficienty, presenting with right leg pain. Patient reports having a heart cath performed two days ago with sites at right groin and right arm. Since yesterday she has had right groin pain and swelling with pain to the entire leg. Also has numbness of the entire right leg. States her right foot was purple this morning, regular color has since returned. Denies any other injuries or complaints. Patient called her cardiologist who advised her to come to the ED. Patient went to [facility] ED where cardiology was consulted and requested [place] vas groin pseudoaneurysm study which was not available at that location, transferred to [facility].   Leg Pain Associated symptoms: no fatigue and no fever        Home Medications Prior to Admission medications   Medication Sig Start Date End Date Taking? Authorizing Provider  gabapentin (NEURONTIN) 100 MG capsule Take 1 capsule (100 mg total) by mouth 3 (three) times daily. 11/05/22  Yes Monna Fam, MD  albuterol (VENTOLIN HFA) 108 (90 Base) MCG/ACT inhaler Inhale 2 puffs into the lungs every 4 (four) hours as needed for wheezing or shortness of breath. Patient taking differently: Inhale 2 puffs into the lungs as needed for wheezing or shortness of breath. 04/25/22   Eber Hong, MD  ALPRAZolam Prudy Feeler) 0.5 MG tablet Take 1 tablet (0.5 mg total) by mouth 2 (two) times daily as needed for anxiety. Patient taking differently: Take 0.5 mg by mouth daily as needed for anxiety. 09/02/20   Wilson Singer, MD  amLODipine (NORVASC) 10 MG tablet Take 1 tablet (10 mg total) by mouth every evening. Patient taking differently: Take 10 mg by mouth daily in  the afternoon. 08/10/22   Chilton Si, MD  aspirin EC 81 MG tablet Take 1 tablet (81 mg total) by mouth daily. 07/16/20   Strader, Lennart Pall, PA-C  chlorthalidone (HYGROTON) 25 MG tablet Take 1 tablet (25 mg total) by mouth daily. 08/10/22 11/08/22  Chilton Si, MD  Cholecalciferol (DIALYVITE VITAMIN D 5000) 125 MCG (5000 UT) capsule Take 5,000 Units by mouth in the morning.    [provider]  clopidogrel (PLAVIX) 75 MG tablet Take 75 mg by mouth in the morning.    [provider]  doxazosin (CARDURA) 2 MG tablet Take 1 tablet (2 mg total) by mouth at bedtime. 07/01/22   Alver Sorrow, NP  escitalopram (LEXAPRO) 20 MG tablet Take 1 tablet (20 mg total) by mouth daily. 10/21/20   Wilson Singer, MD  Evolocumab (REPATHA SURECLICK [place]) Inject 140 mg into the skin every 14 (fourteen) days.    [provider]  fluorometholone (FML) 0.1 % ophthalmic suspension Place 1 drop into both eyes daily. 10/22/21   [provider]  fluticasone-salmeterol (ADVAIR) 100-50 MCG/ACT AEPB INHALE 1 PUFF BY MOUTH TWICE DAILY Patient taking differently: Inhale 1 puff into the lungs as needed (wheezing/SOB). 08/25/20   Wilson Singer, MD  isosorbide mononitrate (IMDUR) 60 MG 24 hr tablet Take 1 tablet (60 mg total) by mouth 2 (two) times daily. 03/30/21   End, Cristal Deer, MD  linaclotide Karlene Einstein) 145 MCG CAPS capsule Take 145 mcg by  mouth daily before breakfast.    [provider]  metoprolol tartrate (LOPRESSOR) 100 MG tablet Take 1 tablet (100 mg total) by mouth 2 (two) times daily. 10/01/22 12/30/22  Hilty, Lisette Abu, MD  nitroGLYCERIN (NITROSTAT) 0.4 MG SL tablet DISSOLVE 1 TABLET UNDER THE TONGUE AS NEEDED FOR CHEST PAIN EVERY 5 MINUTES UP TO 3 TIMES. IF NO RELIEF CALL 911. Patient taking differently: Place 0.4 mg under the tongue every 5 (five) minutes as needed for chest pain. 09/05/20   Jonelle Sidle, MD  NP THYROID 60 MG tablet Take 60 mg by mouth every  morning. Patient not taking: Reported on 11/03/2022 09/24/22   [provider]  omeprazole (PRILOSEC) 40 MG capsule Take 1 capsule (40 mg total) by mouth daily. 08/30/22   Dolores Frame, MD  potassium chloride SA (KLOR-CON M) 20 MEQ tablet Take 1 tablet (20 mEq total) by mouth 2 (two) times daily. Patient taking differently: Take 20 mEq by mouth daily. 08/10/22   Chilton Si, MD  prochlorperazine (COMPAZINE) 10 MG tablet Take 1 tablet (10 mg total) by mouth every 6 (six) hours as needed for nausea or vomiting. Patient not taking: Reported on 11/03/2022 10/28/22   Dolores Frame, MD  RESTASIS 0.05 % ophthalmic emulsion Place 2 drops into both eyes as needed (dry eye). 10/22/21   [provider]  traZODone (DESYREL) 50 MG tablet Take 50 mg by mouth at bedtime as needed for sleep. 05/28/22   [provider]      Allergies    Bee venom, Lipitor [atorvastatin], Penicillins, Shellfish allergy, Spironolactone, Tomato, Valsartan, Zofran [ondansetron], Crestor [rosuvastatin], and Ranolazine    Review of Systems   Review of Systems  Constitutional:  Negative for chills, fatigue and fever.  Respiratory:  Negative for shortness of breath.   Cardiovascular:  Positive for leg swelling. Negative for chest pain.  Gastrointestinal:  Negative for abdominal pain, nausea and vomiting.  Musculoskeletal:  Positive for myalgias.  Neurological:  Positive for numbness.    Physical Exam Updated Vital Signs BP (!) 145/84 (BP Location: Right Arm)   Pulse 75   Temp 98.3 F (36.8 C) (Oral)   Resp 16   Ht 5\' 4"  (1.626 m)   Wt 67.1 kg   SpO2 98%   BMI 25.40 kg/m  Physical Exam Constitutional:      Appearance: She is not toxic-appearing.  Cardiovascular:     Rate and Rhythm: Normal rate and regular rhythm.  Pulmonary:     Effort: Pulmonary effort is normal. No respiratory distress.  Abdominal:     General: There is no distension.     Palpations: Abdomen is  soft.     Tenderness: There is no abdominal tenderness.  Musculoskeletal:     Comments: Swelling around right groin catheter site. Some discoloration on top of right foot and shin. Numbness of entire right leg. Strong right DP pulses.   Neurological:     Mental Status: She is alert.     ED Results / Procedures / Treatments   Labs (all labs ordered are listed, but only abnormal results are displayed) Labs Reviewed  COMPREHENSIVE METABOLIC PANEL - Abnormal; Notable for the following components:      Result Value   Glucose, Bld 103 (*)    All other components within normal limits  D-DIMER, QUANTITATIVE - Abnormal; Notable for the following components:   D-Dimer, Quant 0.98 (*)    All other components within normal limits  CBC  PROTIME-INR  EKG None  Radiology VAS [place] GROIN PSEUDOANEURYSM  Result Date: 11/05/2022  ARTERIAL PSEUDOANEURYSM  Patient Name:  MELANNI RUMPEL  Date of Exam:   11/05/2022 Medical Rec #: [id number]        Accession #:    [id number] Date of Birth: Nov 17, 1965       Patient Gender: F Patient Age:   81 years Exam Location:  [facility] Procedure:      VAS [place] Bobetta Lime Referring Phys: Glyn Ade --------------------------------------------------------------------------------  Exam: Right groin Indications: Patient complains of groin pain and paresthesia. History: S/p catheterization. Comparison Study: No prior studies. Performing Technologist: Jean Rosenthal RDMS, RVT  Examination Guidelines: A complete evaluation includes B-mode imaging, spectral Doppler, color Doppler, and power Doppler as needed of all accessible portions of each vessel. Bilateral testing is considered an integral part of a complete examination. Limited examinations for reoccurring indications may be performed as noted. +------------+----------+---------+------+----------+ Right DuplexPSV (cm/s)Waveform PlaqueComment(s) +------------+----------+---------+------+----------+  CFA            129    triphasic                 +------------+----------+---------+------+----------+ PFA            164    triphasic                 +------------+----------+---------+------+----------+ Prox SFA       109    triphasic                 +------------+----------+---------+------+----------+ Right Vein comments: Patent common femoral vein.  Summary:  No evidence of pseudoaneurysm, AVF or DVT     --------------------------------------------------------------------------------    Preliminary     Procedures Procedures    Medications Ordered in ED Medications  HYDROcodone-acetaminophen (NORCO/VICODIN) 5-325 MG per tablet 1 tablet (1 tablet Oral Given 11/05/22 1428)  oxyCODONE (Oxy IR/ROXICODONE) immediate release tablet 10 mg (10 mg Oral Given 11/05/22 1737)    ED Course/ Medical Decision Making/ A&P Clinical Course as of 11/05/22 1941  Fri Nov 05, 2022  1319 She is here with a complaint of severe pain in her right groin going down her leg with some discoloration of the leg.  She had a cardiac catheterization yesterday and had a arterial puncture on that right femoral.  She also had access to her right upper arm that is bruised but otherwise not bothering her.  She had talked to cardiology and they sent her in for further evaluation.  Getting lab work and ultrasound. [MB]  1714 Stable (Resident) S/P cath with right groin [place] for Pseudoaneurysm rule out [CC]  1938 Evaluated patient primarily at bedside.  Ultrasound nondiagnostic.  Pain is improved with current therapies only aggravated further when patient is palpated in her right groin.  Has a palpable pulse negative ultrasound no evidence of DVT on the workup. Popliteal vein was not imaged but given initiation of pain of left groin femoral nerve injury is more consistent.  Will treat with gabapentin 100 mg 3 times daily and recommend follow-up with cardiology on Monday for further care and management. [CC]    Clinical  Course User Index [CC] Glyn Ade, MD [MB] Terrilee Files, MD                             Medical Decision Making Amount and/or Complexity of Data Reviewed Labs: ordered.  Risk Prescription drug management.  Medical Decision Making:   Lucendia Herrlich  KRISTYANA BISCEGLIA is a 57 y.o. female who presented to the ED today with leg pain detailed above.    Patient's presentation is complicated by their history of recent cardiac catheterization.  Complete initial physical exam performed, notably the patient  was hemodynamically stable.    Reviewed and confirmed nursing documentation for past medical history, family history, social history.    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 Other diagnoses were considered including (but not limited to) pseudoaneurysm, hematoma, DVT. These are considered less likely due to history of present illness and physical exam findings.    Initial Plan:  Screening labs including CBC and Metabolic panel to evaluate for infectious or metabolic etiology of disease.  Ultrasound groin pseudoaneurysm study Objective evaluation as below reviewed   Initial Study Results:   Laboratory  All laboratory results reviewed without evidence of clinically relevant pathology.    EKG EKG was reviewed independently. Rate, rhythm, axis, intervals all examined and without medically relevant abnormality. ST segments without concerns for elevations.    Radiology:  All images reviewed independently. Agree with radiology report at this time.   VAS [place] GROIN PSEUDOANEURYSM  Result Date: 11/05/2022  ARTERIAL PSEUDOANEURYSM  Patient Name:  Bethany Wang  Date of Exam:   11/05/2022 Medical Rec #: [id number]        Accession #:    [id number] Date of Birth: May 04, 1965       Patient Gender: F Patient Age:   46 years Exam Location:  [facility] Procedure:      VAS [place] Bobetta Lime Referring Phys: Glyn Ade  --------------------------------------------------------------------------------  Exam: Right groin Indications: Patient complains of groin pain and paresthesia. History: S/p catheterization. Comparison Study: No prior studies. Performing Technologist: Jean Rosenthal RDMS, RVT  Examination Guidelines: A complete evaluation includes B-mode imaging, spectral Doppler, color Doppler, and power Doppler as needed of all accessible portions of each vessel. Bilateral testing is considered an integral part of a complete examination. Limited examinations for reoccurring indications may be performed as noted. +------------+----------+---------+------+----------+ Right DuplexPSV (cm/s)Waveform PlaqueComment(s) +------------+----------+---------+------+----------+ CFA            129    triphasic                 +------------+----------+---------+------+----------+ PFA            164    triphasic                 +------------+----------+---------+------+----------+ Prox SFA       109    triphasic                 +------------+----------+---------+------+----------+ Right Vein comments: Patent common femoral vein.  Summary:  No evidence of pseudoaneurysm, AVF or DVT     --------------------------------------------------------------------------------    Preliminary    CARDIAC CATHETERIZATION  Result Date: 11/03/2022   Dist LAD lesion is 50% stenosed.   Mid LAD-2 lesion is 30% stenosed.   Prox LAD lesion is 30% stenosed.   Prox Cx to Dist Cx lesion is 30% stenosed.   Prox RCA to Mid RCA lesion is 100% stenosed.   Ost 2nd Diag lesion is 75% stenosed.   2nd Diag lesion is 70% stenosed.   1st Mrg lesion is 75% stenosed.   2nd Mrg lesion is 20% stenosed.   Mid LAD-1 lesion is 50% stenosed. Stable multi-vessel CAD with no change since last cardiac cath. Patent mid LAD stent with moderate restenosis-unchanged. (  Previous flow wire negative) Moderate disease in the small Diagonal branch and small obtuse marginal  branch-both too small for PCI. Chronic total occlusion of the proximal RCA. The mid and distal RCA and distal branches fill from left to right collaterals.  RA 4, RV 34/2/3, PA 34/13 mean 20, PCWP 6, LVEDP 18 mmHg., AO 177/80 Uncontrolled HTN Recommendations: Continue medical management of CAD. Aggressive approach to HTN control.      Consults: Case discussed with cardiology.   Reassessment and Plan:   Patient is a 57 year old woman presenting with leg pain and numbness after catheterization. Ultrasound study showed no pseudoaneurysm, DVT. Patient was administered oxycodone for pain relief. On reassessment, patient was resting comfortably in bed. Patient agreed with plan to be discharged with gabapentin for neuropathic pain and cardiology follow up.           Final Clinical Impression(s) / ED Diagnoses Final diagnoses:  Right leg pain    Rx / DC Orders ED Discharge Orders          Ordered    gabapentin (NEURONTIN) 100 MG capsule  3 times daily        11/05/22 1940              Monna Fam, MD 11/05/22 1944    Glyn Ade, MD 11/05/22 2321

## 2022-11-05 NOTE — Telephone Encounter (Signed)
Patient states she has swelling from cath on Wednesday.  She states swelling to Right leg and foot and purple color. States also numb and cold. She states started last night .  Advised to go to ED and be evaluated.  She is going now

## 2022-11-05 NOTE — ED Provider Notes (Signed)
[place] [facility] AT [facility] Provider Note   CSN: [id number] Arrival date & time: 11/05/22  1235     History  Chief Complaint  Patient presents with   Leg Pain    Bethany Wang is a 57 y.o. female.  She has PMH of CAD,  IBS, SVT and aortic insufficiency.  She presents the ER complaining of right groin pain and swelling with pain to the entire right leg since this morning.  Had cardiac cath 2 days ago.  Presents today with right groin swelling and pain with complaints that right foot was purple and numb this morning.  States color is gone back to normal at this time.  Denies any other injuries or complaints.  She called her cardiologist office and they advise she come to the ED for evaluation.   Leg Pain      Home Medications Prior to Admission medications   Medication Sig Start Date End Date Taking? Authorizing Provider  albuterol (VENTOLIN HFA) 108 (90 Base) MCG/ACT inhaler Inhale 2 puffs into the lungs every 4 (four) hours as needed for wheezing or shortness of breath. Patient taking differently: Inhale 2 puffs into the lungs as needed for wheezing or shortness of breath. 04/25/22   Eber Hong, MD  ALPRAZolam Prudy Feeler) 0.5 MG tablet Take 1 tablet (0.5 mg total) by mouth 2 (two) times daily as needed for anxiety. Patient taking differently: Take 0.5 mg by mouth daily as needed for anxiety. 09/02/20   Wilson Singer, MD  amLODipine (NORVASC) 10 MG tablet Take 1 tablet (10 mg total) by mouth every evening. Patient taking differently: Take 10 mg by mouth daily in the afternoon. 08/10/22   Chilton Si, MD  aspirin EC 81 MG tablet Take 1 tablet (81 mg total) by mouth daily. 07/16/20   Strader, Lennart Pall, PA-C  chlorthalidone (HYGROTON) 25 MG tablet Take 1 tablet (25 mg total) by mouth daily. 08/10/22 11/08/22  Chilton Si, MD  Cholecalciferol (DIALYVITE VITAMIN D 5000) 125 MCG (5000 UT) capsule Take 5,000 Units by mouth in the morning.     [provider]  clopidogrel (PLAVIX) 75 MG tablet Take 75 mg by mouth in the morning.    [provider]  doxazosin (CARDURA) 2 MG tablet Take 1 tablet (2 mg total) by mouth at bedtime. 07/01/22   Alver Sorrow, NP  escitalopram (LEXAPRO) 20 MG tablet Take 1 tablet (20 mg total) by mouth daily. 10/21/20   Wilson Singer, MD  Evolocumab (REPATHA SURECLICK [place]) Inject 140 mg into the skin every 14 (fourteen) days.    [provider]  fluorometholone (FML) 0.1 % ophthalmic suspension Place 1 drop into both eyes daily. 10/22/21   [provider]  fluticasone-salmeterol (ADVAIR) 100-50 MCG/ACT AEPB INHALE 1 PUFF BY MOUTH TWICE DAILY Patient taking differently: Inhale 1 puff into the lungs as needed (wheezing/SOB). 08/25/20   Wilson Singer, MD  isosorbide mononitrate (IMDUR) 60 MG 24 hr tablet Take 1 tablet (60 mg total) by mouth 2 (two) times daily. 03/30/21   End, Cristal Deer, MD  linaclotide (LINZESS) 145 MCG CAPS capsule Take 145 mcg by mouth daily before breakfast.    [provider]  metoprolol tartrate (LOPRESSOR) 100 MG tablet Take 1 tablet (100 mg total) by mouth 2 (two) times daily. 10/01/22 12/30/22  Hilty, Lisette Abu, MD  nitroGLYCERIN (NITROSTAT) 0.4 MG SL tablet DISSOLVE 1 TABLET UNDER THE TONGUE AS NEEDED FOR CHEST PAIN EVERY 5 MINUTES UP TO 3  TIMES. IF NO RELIEF CALL 911. Patient taking differently: Place 0.4 mg under the tongue every 5 (five) minutes as needed for chest pain. 09/05/20   Jonelle Sidle, MD  NP THYROID 60 MG tablet Take 60 mg by mouth every morning. Patient not taking: Reported on 11/03/2022 09/24/22   [provider]  omeprazole (PRILOSEC) 40 MG capsule Take 1 capsule (40 mg total) by mouth daily. 08/30/22   Dolores Frame, MD  potassium chloride SA (KLOR-CON M) 20 MEQ tablet Take 1 tablet (20 mEq total) by mouth 2 (two) times daily. Patient taking differently: Take 20 mEq by mouth daily. 08/10/22    Chilton Si, MD  prochlorperazine (COMPAZINE) 10 MG tablet Take 1 tablet (10 mg total) by mouth every 6 (six) hours as needed for nausea or vomiting. Patient not taking: Reported on 11/03/2022 10/28/22   Dolores Frame, MD  RESTASIS 0.05 % ophthalmic emulsion Place 2 drops into both eyes as needed (dry eye). 10/22/21   [provider]  traZODone (DESYREL) 50 MG tablet Take 50 mg by mouth at bedtime as needed for sleep. 05/28/22   [provider]      Allergies    Bee venom, Lipitor [atorvastatin], Penicillins, Shellfish allergy, Spironolactone, Tomato, Valsartan, Zofran [ondansetron], Crestor [rosuvastatin], and Ranolazine    Review of Systems   Review of Systems  Physical Exam Updated Vital Signs BP (!) 151/79 (BP Location: Left Arm)   Pulse 67   Temp 98.6 F (37 C) (Oral)   Resp 16   Ht 5\' 4"  (1.626 m)   Wt 67.1 kg   SpO2 99%   BMI 25.40 kg/m  Physical Exam Vitals and nursing note reviewed.  Constitutional:      General: She is not in acute distress.    Appearance: She is well-developed.  HENT:     Head: Normocephalic and atraumatic.  Eyes:     Conjunctiva/sclera: Conjunctivae normal.  Cardiovascular:     Rate and Rhythm: Normal rate and regular rhythm.     Heart sounds: No murmur heard. Pulmonary:     Effort: Pulmonary effort is normal. No respiratory distress.     Breath sounds: Normal breath sounds.  Abdominal:     Palpations: Abdomen is soft.     Tenderness: There is no abdominal tenderness.  Musculoskeletal:        General: No swelling.     Cervical back: Neck supple.     Comments: Bandage intact to right groin with mild swelling and moderate tenderness.  No bruit is palpated.   Pulses bounding in right foot  Skin:    General: Skin is warm and dry.     Capillary Refill: Capillary refill takes less than 2 seconds.  Neurological:     General: No focal deficit present.     Mental Status: She is alert and oriented to person,  place, and time.  Psychiatric:        Mood and Affect: Mood normal.     ED Results / Procedures / Treatments   Labs (all labs ordered are listed, but only abnormal results are displayed) Labs Reviewed  COMPREHENSIVE METABOLIC PANEL - Abnormal; Notable for the following components:      Result Value   Glucose, Bld 103 (*)    All other components within normal limits  D-DIMER, QUANTITATIVE - Abnormal; Notable for the following components:   D-Dimer, Quant 0.98 (*)    All other components within normal limits  CBC  PROTIME-INR  EKG None  Radiology CARDIAC CATHETERIZATION  Result Date: 11/03/2022   Dist LAD lesion is 50% stenosed.   Mid LAD-2 lesion is 30% stenosed.   Prox LAD lesion is 30% stenosed.   Prox Cx to Dist Cx lesion is 30% stenosed.   Prox RCA to Mid RCA lesion is 100% stenosed.   Ost 2nd Diag lesion is 75% stenosed.   2nd Diag lesion is 70% stenosed.   1st Mrg lesion is 75% stenosed.   2nd Mrg lesion is 20% stenosed.   Mid LAD-1 lesion is 50% stenosed. Stable multi-vessel CAD with no change since last cardiac cath. Patent mid LAD stent with moderate restenosis-unchanged. (Previous flow wire negative) Moderate disease in the small Diagonal branch and small obtuse marginal branch-both too small for PCI. Chronic total occlusion of the proximal RCA. The mid and distal RCA and distal branches fill from left to right collaterals.  RA 4, RV 34/2/3, PA 34/13 mean 20, PCWP 6, LVEDP 18 mmHg., AO 177/80 Uncontrolled HTN Recommendations: Continue medical management of CAD. Aggressive approach to HTN control.    Procedures Procedures    Medications Ordered in ED Medications  HYDROcodone-acetaminophen (NORCO/VICODIN) 5-325 MG per tablet 1 tablet (1 tablet Oral Given 11/05/22 1428)    ED Course/ Medical Decision Making/ A&P Clinical Course as of 11/05/22 1447  Fri Nov 05, 2022  1319 She is here with a complaint of severe pain in her right groin going down her leg with some  discoloration of the leg.  She had a cardiac catheterization yesterday and had a arterial puncture on that right femoral.  She also had access to her right upper arm that is bruised but otherwise not bothering her.  She had talked to cardiology and they sent her in for further evaluation.  Getting lab work and ultrasound. [MB]    Clinical Course User Index [MB] Terrilee Files, MD                             Medical Decision Making This patient presents to the ED for concern of right groin pain and right leg pain after cardiac catheterization with right femoral access 2 days ago, this involves an extensive number of treatment options, and is a complaint that carries with it a high risk of complications and morbidity.  The differential diagnosis includes pseudoaneurysm, hematoma, neuritis, other   Co morbidities that complicate the patient evaluation :   CAD   Additional history obtained:  Additional history obtained from EMR External records from outside source obtained and reviewed including recent cardio notes   Lab Tests:  I Ordered, and personally interpreted labs.  The pertinent results include: CMP is reassuring, D-dimer have been ordered at triage slightly elevated at 0.98 which is not unusual in setting of recent cardiac intervention, CBC is normal, INR is normal    Consultations Obtained:  I requested consultation with the neurologist Dr. Wyline Mood,  and discussed lab and imaging findings as well as pertinent plan - they recommend: Vascular ultrasound right groin for pseudoaneurysm   Problem List / ED Course / Critical interventions / Medication management  Presents with right groin pain and swelling with pain to the her entire right leg.  She has excellent pulses and no noted color changes to the foot.  She does have some swelling and pain to the site of the right groin where she had recent access for her catheterization.  As above discussed with  Dr. Wyline Mood who recommended  she needs vascular ultrasound.  I discussed with the ultrasound tech he states they do not do this here patient want to go to [facility] for further evaluation.  Discussed with patient who is agreeable with this.  She is going to have her daughter drive her down.  Discussed that she needs to go directly to the ED there.  Dr. Cathi Roan at [facility], ED is accepting patient for ED to ED transfer.  Unfortunately I was unable to order the imaging as has not available at this campus. I ordered medication including Norco for pain I have reviewed the patients home medicines and have made adjustments as needed       Amount and/or Complexity of Data Reviewed Labs: ordered.  Risk Prescription drug management.           Final Clinical Impression(s) / ED Diagnoses Final diagnoses:  Right leg pain    Rx / DC Orders ED Discharge Orders     None         Josem Kaufmann 11/05/22 1447    Terrilee Files, MD 11/05/22 1742

## 2022-11-08 ENCOUNTER — Ambulatory Visit (INDEPENDENT_AMBULATORY_CARE_PROVIDER_SITE_OTHER): Payer: 59 | Admitting: Gastroenterology

## 2022-11-12 ENCOUNTER — Encounter: Payer: Self-pay | Admitting: Internal Medicine

## 2022-11-17 DIAGNOSIS — M549 Dorsalgia, unspecified: Secondary | ICD-10-CM | POA: Diagnosis not present

## 2022-11-17 DIAGNOSIS — Z6824 Body mass index (BMI) 24.0-24.9, adult: Secondary | ICD-10-CM | POA: Diagnosis not present

## 2022-11-25 NOTE — Progress Notes (Deleted)
Cardiology Clinic Note   Patient Name: Bethany Wang Date of Encounter: 11/25/2022  Primary Care Provider:  Donetta Potts, MD Primary Cardiologist:  Chrystie Nose, MD  Patient Profile    57 year old female with history of coronary artery disease with stent to the proximal LAD and 90% diagonal ostial narrowing, 60-70% obtuse marginal narrowing and 50%  narrowing distal circumflex.  Hyperlipidemia, palpitations with SVT and syncope, hypertension, and moderate to severe AI.  When last seen by Dr. Rennis Golden on 10/29/2022 she was scheduled for right and left heart catheterization.  This revealed stable multivessel CAD with no change since last cardiac catheterization.  She had a patent mid LAD stent with moderate restenosis which was unchanged, moderate disease in the small diagonal branch and small obtuse marginal branch both too small for PCI, chronic total occlusion of the proximal RCA.  The mid and distal RCA and distal branches fill from left to right collaterals.  She was continue aggressive medical therapy.  Following procedure the patient had some pain in her right groin, vascular ultrasound revealed no evidence of pseudoaneurysm aVF or DVT.   Past Medical History    Past Medical History:  Diagnosis Date   Anemia    Anxiety    Bilateral breast cysts 12/30/2015   Brain tumor (HCC)    Breast cancer (HCC)    Left Breast Cancer   Breast disorder    BV (bacterial vaginosis) 09/09/2015   Coronary atherosclerosis of native coronary artery    a. s/p DES to RCA in 2013 b. DES to LAD in 2014 c. cath in 04/2016 showing patent LAD stent with D2 jailed by LAD stent and CTO of RCA with left to right collaterals present   Cyst of pharynx or nasopharynx    [name]   Decreased libido 12/25/2018   Depression    Diabetes mellitus without complication (HCC)    Essential hypertension    Falls    x 3-4 in past year   GERD (gastroesophageal reflux disease)    Headache     Heart attack (HCC)    x4   History of hematuria    Mixed hyperlipidemia    Personal history of chemotherapy    Left Breast Cancer   Personal history of radiation therapy    Left Breast Cancer   Pre-diabetes    PUD (peptic ulcer disease)    Seizures (HCC)    Shingles 09/09/2015   Stroke (HCC)    12-2017 on Plavix, only deficit is headaches   Suicide attempt (HCC)    3 attempts in remote past   Trichimoniasis 08/20/2019   Treated 08/20/19    Uterine cancer (HCC)    Vitamin D deficiency disease 04/03/2019   Past Surgical History:  Procedure Laterality Date   ABDOMINAL HYSTERECTOMY     BIOPSY  11/28/2019   Procedure: BIOPSY;  Surgeon: Malissa Hippo, MD;  [facility]: [facility];  Service: Endoscopy;;  antral, gastric body    BIOPSY  08/18/2022   Procedure: BIOPSY;  Surgeon: Dolores Frame, MD;  Location: [facility];  Service: Gastroenterology;;   BLADDER SURGERY     BREAST LUMPECTOMY Left    BREAST LUMPECTOMY Right 02/24/2018   Procedure: RIGHT BREAST LUMPECTOMY ERAS PATHWAY;  Surgeon: Griselda Miner, MD;  Location: [facility] OR;  Service: General;  Laterality: Right;   CARDIAC CATHETERIZATION N/A 05/10/2016   Procedure: Left Heart Cath and Coronary Angiography;  Surgeon: Lyn Records, MD;  Location: [facility] INVASIVE  CV LAB;  Service: Cardiovascular;  Laterality: N/A;   CAUTERIZE INNER NOSE  07/13/2020   COLONOSCOPY  May 2010   Fleishman: normal rectum, internal hemorrhoids, , benign colonic polyp   COLONOSCOPY N/A 01/05/2017   Procedure: COLONOSCOPY;  Surgeon: Malissa Hippo, MD;  [facility]: [facility];  Service: Endoscopy;  Laterality: N/A;  1:00   CORONARY PRESSURE/FFR STUDY N/A 10/23/2020   Procedure: INTRAVASCULAR PRESSURE WIRE/FFR STUDY;  Surgeon: Lyn Records, MD;  Location: [facility] INVASIVE CV LAB;  Service: Cardiovascular;  Laterality: N/A;   CORONARY PRESSURE/FFR STUDY N/A 03/30/2021   Procedure: INTRAVASCULAR PRESSURE WIRE/FFR STUDY;  Surgeon: Yvonne Kendall, MD;   Location: [facility] INVASIVE CV LAB;  Service: Cardiovascular;  Laterality: N/A;   ESOPHAGEAL DILATION  11/28/2019   Procedure: ESOPHAGEAL DILATION;  Surgeon: Malissa Hippo, MD;  [facility];  Service: Endoscopy;;   ESOPHAGOGASTRODUODENOSCOPY     2001 Dr. Madilyn Fireman: distal esophagitis, small hiatal hernia,. Dr. Katrinka Blazing 2006? no records available currently, pt also reports  EGD a few years ago with Dr. Jena Gauss, do not have these reports anywhere in medical records   ESOPHAGOGASTRODUODENOSCOPY  06/02/2011   DGU:[id number] pill impaction as described above s/p dilation of a probable cervical esophageal web/bx abnormal esophageal and gastric mucosa. + H.pylori gastritis    ESOPHAGOGASTRODUODENOSCOPY (EGD) WITH PROPOFOL N/A 11/28/2019   Procedure: ESOPHAGOGASTRODUODENOSCOPY (EGD) WITH PROPOFOL;  Surgeon: Malissa Hippo, MD;  [facility];  Service: Endoscopy;  Laterality: N/A;  1020   ESOPHAGOGASTRODUODENOSCOPY (EGD) WITH PROPOFOL N/A 08/18/2022   Procedure: ESOPHAGOGASTRODUODENOSCOPY (EGD) WITH PROPOFOL;  Surgeon: Dolores Frame, MD;  [facility]: [facility];  Service: Gastroenterology;  Laterality: N/A;  8:30AM;ASA 1   EYE SURGERY     Removed glass   HAND SURGERY Right    Left breast lumpectomy     Benign   LEFT HEART CATH AND CORONARY ANGIOGRAPHY N/A 10/25/2018   Procedure: LEFT HEART CATH AND CORONARY ANGIOGRAPHY;  Surgeon: Iran Ouch, MD;  Location: [facility] CV LAB;  Service: Cardiovascular;  Laterality: N/A;   LEFT HEART CATH AND CORONARY ANGIOGRAPHY N/A 10/23/2020   Procedure: LEFT HEART CATH AND CORONARY ANGIOGRAPHY;  Surgeon: Lyn Records, MD;  Location: [facility] CV LAB;  Service: Cardiovascular;  Laterality: N/A;   LEFT HEART CATH AND CORONARY ANGIOGRAPHY N/A 03/30/2021   Procedure: LEFT HEART CATH AND CORONARY ANGIOGRAPHY;  Surgeon: Yvonne Kendall, MD;  Location: [facility] CV [facility];  Service: Cardiovascular;  Laterality: N/A;   LEFT HEART CATH AND CORONARY  ANGIOGRAPHY N/A 01/22/2022   Procedure: LEFT HEART CATH AND CORONARY ANGIOGRAPHY;  Surgeon: Tonny Bollman, MD;  Location: [facility] INVASIVE CV LAB;  Service: Cardiovascular;  Laterality: N/A;   LEFT HEART CATHETERIZATION WITH CORONARY ANGIOGRAM N/A 07/03/2014   Procedure: LEFT HEART CATHETERIZATION WITH CORONARY ANGIOGRAM;  Surgeon: Lyn Records, MD;  Location: [facility] CATH LAB;  Service: Cardiovascular;  Laterality: N/A;   POLYPECTOMY  01/05/2017   Procedure: POLYPECTOMY;  Surgeon: Malissa Hippo, MD;  [facility];  Service: Endoscopy;;  colon   RIGHT/LEFT HEART CATH AND CORONARY ANGIOGRAPHY N/A 11/03/2022   Procedure: RIGHT/LEFT HEART CATH AND CORONARY ANGIOGRAPHY;  Surgeon: Kathleene Hazel, MD;  Location: [facility] CV LAB;  Service: Cardiovascular;  Laterality: N/A;   SHOULDER SURGERY Left     Allergies  Allergies  Allergen Reactions   Bee Venom Anaphylaxis and Other (See Comments)    "throat swelled and had to the hospital" - Yellow jacket sting  Lipitor [Atorvastatin] Rash and Other (See Comments)    Liver damage & Upper Abdominal pain, Break out like a rash . Per last hosp visit. It exposed on inpatient stay.   Penicillins Anaphylaxis   Shellfish Allergy Swelling   Spironolactone Other (See Comments)    Nausea, dehydration   Tomato Itching and Swelling   Valsartan Itching   Zofran [Ondansetron] Itching   Crestor [Rosuvastatin] Rash and Other (See Comments)    Upper Abdominal pain   Ranolazine Rash    History of Present Illness    ***  Home Medications    Current Outpatient Medications  Medication Sig Dispense Refill   albuterol (VENTOLIN HFA) 108 (90 Base) MCG/ACT inhaler Inhale 2 puffs into the lungs every 4 (four) hours as needed for wheezing or shortness of breath. (Patient taking differently: Inhale 2 puffs into the lungs as needed for wheezing or shortness of breath.) 1 each 3   ALPRAZolam (XANAX) 0.5 MG tablet Take 1 tablet (0.5 mg total) by mouth 2  (two) times daily as needed for anxiety. (Patient taking differently: Take 0.5 mg by mouth daily as needed for anxiety.) 30 tablet 0   amLODipine (NORVASC) 10 MG tablet Take 1 tablet (10 mg total) by mouth every evening. (Patient taking differently: Take 10 mg by mouth daily in the afternoon.) 90 tablet 3   aspirin EC 81 MG tablet Take 1 tablet (81 mg total) by mouth daily. 180 tablet 1   chlorthalidone (HYGROTON) 25 MG tablet Take 1 tablet (25 mg total) by mouth daily. 90 tablet 3   Cholecalciferol (DIALYVITE VITAMIN D 5000) 125 MCG (5000 UT) capsule Take 5,000 Units by mouth in the morning.     clopidogrel (PLAVIX) 75 MG tablet Take 75 mg by mouth in the morning.     doxazosin (CARDURA) 2 MG tablet Take 1 tablet (2 mg total) by mouth at bedtime. 30 tablet 2   escitalopram (LEXAPRO) 20 MG tablet Take 1 tablet (20 mg total) by mouth daily. 30 tablet 3   Evolocumab (REPATHA SURECLICK [place]) Inject 140 mg into the skin every 14 (fourteen) days.     fluorometholone (FML) 0.1 % ophthalmic suspension Place 1 drop into both eyes daily.     fluticasone-salmeterol (ADVAIR) 100-50 MCG/ACT AEPB INHALE 1 PUFF BY MOUTH TWICE DAILY (Patient taking differently: Inhale 1 puff into the lungs as needed (wheezing/SOB).) 60 each 10   gabapentin (NEURONTIN) 100 MG capsule Take 1 capsule (100 mg total) by mouth 3 (three) times daily. 30 capsule 0   isosorbide mononitrate (IMDUR) 60 MG 24 hr tablet Take 1 tablet (60 mg total) by mouth 2 (two) times daily. 60 tablet 5   linaclotide (LINZESS) 145 MCG CAPS capsule Take 145 mcg by mouth daily before breakfast.     metoprolol tartrate (LOPRESSOR) 100 MG tablet Take 1 tablet (100 mg total) by mouth 2 (two) times daily. 180 tablet 3   nitroGLYCERIN (NITROSTAT) 0.4 MG SL tablet DISSOLVE 1 TABLET UNDER THE TONGUE AS NEEDED FOR CHEST PAIN EVERY 5 MINUTES UP TO 3 TIMES. IF NO RELIEF CALL 911. (Patient taking differently: Place 0.4 mg under the tongue every 5 (five) minutes as needed for  chest pain.) 25 tablet 1   NP THYROID 60 MG tablet Take 60 mg by mouth every morning. (Patient not taking: Reported on 11/03/2022)     omeprazole (PRILOSEC) 40 MG capsule Take 1 capsule (40 mg total) by mouth daily. 90 capsule 3   potassium chloride SA (KLOR-CON M) 20  MEQ tablet Take 1 tablet (20 mEq total) by mouth 2 (two) times daily. (Patient taking differently: Take 20 mEq by mouth daily.) 90 tablet 1   prochlorperazine (COMPAZINE) 10 MG tablet Take 1 tablet (10 mg total) by mouth every 6 (six) hours as needed for nausea or vomiting. (Patient not taking: Reported on 11/03/2022) 90 tablet 1   RESTASIS 0.05 % ophthalmic emulsion Place 2 drops into both eyes as needed (dry eye).     traZODone (DESYREL) 50 MG tablet Take 50 mg by mouth at bedtime as needed for sleep.     No current facility-administered medications for this visit.     Family History    Family History  Problem Relation Age of Onset   Colon cancer Paternal Grandfather    Cancer Father    Cancer Maternal Uncle    Alzheimer's disease Paternal Aunt    Cirrhosis Maternal Uncle    Cancer Paternal Aunt        lung   Aneurysm Mother        brain   Heart disease Brother    Heart attack Brother    Mental illness Daughter    ADD / ADHD Daughter    Bipolar disorder Daughter    Mental illness Son    ADD / ADHD Son    Bipolar disorder Son    Mental illness Son    ADD / ADHD Son    Bipolar disorder Son    She indicated that her mother is deceased. She indicated that her father is deceased. She indicated that all of her four sisters are alive. She indicated that all of her nine brothers are alive. She indicated that her paternal grandfather is deceased. She indicated that her daughter is alive. She indicated that both of her sons are alive. She indicated that both of her maternal uncles are deceased. She indicated that both of her paternal aunts are deceased. She indicated that her other is alive.  Social History    Social  History   Socioeconomic History   Marital status: Married    Spouse name: Luisa Hart   Number of children: 3   Years of education: Assoc   Highest education level: Not on file  Occupational History   Occupation: Retired    Comment: disability  Tobacco Use   Smoking status: Former    Current packs/day: 0.00    Average packs/day: 0.2 packs/day for 32.0 years (6.4 ttl pk-yrs)    Types: Cigarettes, Cigars    Start date: 07/05/1983    Quit date: 04/03/2012    Years since quitting: 10.6   Smokeless tobacco: Never  Vaping Use   Vaping status: Never Used  Substance and Sexual Activity   Alcohol use: No    Alcohol/week: 0.0 standard drinks of alcohol   Drug use: No   Sexual activity: Yes    Birth control/protection: Surgical    Comment: hyst  Other Topics Concern   Not on file  Social History Narrative   Patient lives at new home with husband and extended family..On disability since age 80 yrs.   Social Determinants of Health   Financial Resource Strain: Medium Risk (01/13/2021)   Overall Financial Resource Strain (CARDIA)    Difficulty of Paying Living Expenses: Somewhat hard  Food Insecurity: No Food Insecurity (01/13/2021)   Hunger Vital Sign    Worried About Running Out of Food in the Last Year: Never true    Ran Out of Food in the Last Year: Never true  Transportation Needs: No Transportation Needs (01/13/2021)   PRAPARE - Administrator, Civil Service (Medical): No    Lack of Transportation (Non-Medical): No  Physical Activity: Sufficiently Active (01/13/2021)   Exercise Vital Sign    Days of Exercise per Week: 7 days    Minutes of Exercise per Session: 60 min  Stress: Stress Concern Present (01/13/2021)   [employer] of Occupational Health - Occupational Stress Questionnaire    Feeling of Stress : To some extent  Social Connections: Moderately Isolated (01/13/2021)   Social Connection and Isolation Panel [NHANES]    Frequency of Communication with Friends  and Family: Never    Frequency of Social Gatherings with Friends and Family: Never    Attends Religious Services: More than 4 times per year    Active Member of [employer] or Organizations: No    Attends Banker Meetings: Never    Marital Status: Married  Catering manager Violence: Not At Risk (01/13/2021)   Humiliation, Afraid, Rape, and Kick questionnaire    Fear of Current or Ex-Partner: No    Emotionally Abused: No    Physically Abused: No    Sexually Abused: No     Review of Systems    General:  No chills, fever, night sweats or weight changes.  Cardiovascular:  No chest pain, dyspnea on exertion, edema, orthopnea, palpitations, paroxysmal nocturnal dyspnea. Dermatological: No rash, lesions/masses Respiratory: No cough, dyspnea Urologic: No hematuria, dysuria Abdominal:   No nausea, vomiting, diarrhea, bright red blood per rectum, melena, or hematemesis Neurologic:  No visual changes, wkns, changes in mental status. All other systems reviewed and are otherwise negative except as noted above.       Physical Exam    VS:  There were no vitals taken for this visit. , BMI There is no height or weight on file to calculate BMI. STOP-Bang Score:  5  { Consider Dx Sleep Disordered Breathing or Sleep Apnea  ICD G47.33          :1}    GEN: Well nourished, well developed, in no acute distress. HEENT: normal. Neck: Supple, no JVD, carotid bruits, or masses. Cardiac: RRR, no murmurs, rubs, or gallops. No clubbing, cyanosis, edema.  Radials/DP/PT 2+ and equal bilaterally.  Respiratory:  Respirations regular and unlabored, clear to auscultation bilaterally. GI: Soft, nontender, nondistended, BS + x 4. MS: no deformity or atrophy. Skin: warm and dry, no rash. Neuro:  Strength and sensation are intact. Psych: Normal affect.      Lab Results  Component Value Date   WBC 7.6 11/05/2022   HGB 13.5 11/05/2022   HCT 40.8 11/05/2022   MCV 84.0 11/05/2022   PLT 241 11/05/2022    Lab Results  Component Value Date   CREATININE 0.81 11/05/2022   BUN 15 11/05/2022   NA 138 11/05/2022   K 3.6 11/05/2022   CL 105 11/05/2022   CO2 22 11/05/2022   Lab Results  Component Value Date   ALT 22 11/05/2022   AST 32 11/05/2022   ALKPHOS 124 11/05/2022   BILITOT 1.0 11/05/2022   Lab Results  Component Value Date   CHOL 127 11/01/2022   HDL 54 11/01/2022   LDLCALC 61 11/01/2022   TRIG 62 11/01/2022   CHOLHDL 2.4 11/01/2022    Lab Results  Component Value Date   HGBA1C 5.1 10/29/2019     Review of Prior Studies      Assessment & Plan   1.  ***     {  Are you ordering a CV Procedure (e.g. stress test, cath, DCCV, TEE, etc)?   Press F2        :[id number]}   Signed, Bettey Mare. Liborio Nixon, ANP, AACC   11/25/2022 4:52 PM      Office [phone] Fax [phone]  Notice: This dictation was prepared with Dragon dictation along with smaller phrase technology. Any transcriptional errors that result from this process are unintentional and may not be corrected upon review.

## 2022-11-26 ENCOUNTER — Ambulatory Visit: Payer: 59 | Attending: Adult Health | Admitting: Adult Health

## 2022-12-07 ENCOUNTER — Telehealth (INDEPENDENT_AMBULATORY_CARE_PROVIDER_SITE_OTHER): Payer: Self-pay | Admitting: Gastroenterology

## 2022-12-07 NOTE — Telephone Encounter (Signed)
Pt was seen in office in July and needed CT scan scheduled. Fax over PA to [facility] but had not heard anything. Checked website for [employer] and no PA needed. No PA needed for [facility] either. Called central scheduling to get pt scheduled. Pt scheduled for 12/14/22 at 5pm. Pt will need to arrive at 4:45 pm. Left message for pt to return call.

## 2022-12-14 ENCOUNTER — Ambulatory Visit (HOSPITAL_BASED_OUTPATIENT_CLINIC_OR_DEPARTMENT_OTHER): Admission: RE | Admit: 2022-12-14 | Payer: 59 | Source: Ambulatory Visit

## 2022-12-22 DIAGNOSIS — J40 Bronchitis, not specified as acute or chronic: Secondary | ICD-10-CM | POA: Diagnosis not present

## 2022-12-22 DIAGNOSIS — Z6823 Body mass index (BMI) 23.0-23.9, adult: Secondary | ICD-10-CM | POA: Diagnosis not present

## 2023-01-17 ENCOUNTER — Telehealth (HOSPITAL_BASED_OUTPATIENT_CLINIC_OR_DEPARTMENT_OTHER): Payer: Self-pay | Admitting: Internal Medicine

## 2023-01-17 NOTE — Telephone Encounter (Signed)
Patient came in because she need to know what medication she need to take. Patient stated that she is not doing good and need an appointment ASAP. She said she been calling but can't get in touch with anybody.

## 2023-01-17 NOTE — Telephone Encounter (Signed)
Returned call to patient, no answer, left message to call back!  

## 2023-01-17 NOTE — Telephone Encounter (Signed)
Patient still in lobby, secure chatted by scheduler. Patient scheduled 8/14 with TR. Patient advised of ED precautions.

## 2023-01-24 ENCOUNTER — Ambulatory Visit (HOSPITAL_BASED_OUTPATIENT_CLINIC_OR_DEPARTMENT_OTHER): Payer: 59 | Admitting: Cardiovascular Disease

## 2023-01-24 NOTE — Progress Notes (Deleted)
Cardiology Office Note:  .   Date:  01/24/2023  ID:  Bethany Wang, DOB 1965-09-29, MRN [id number] PCP: Donetta Potts, MD  [facility] Providers Cardiologist:  Chrystie Nose, MD { Click to update primary MD,subspecialty MD or APP then REFRESH:1}   History of Present Illness: .   Bethany Wang is a 57 y.o. female  with a hx of CAD (s/p DES to RCA 2013, DES to LAD 2014, cath 04/2016 and 10/2020 and 01/2022 patent LAD stent with jailed D2 by LAD stent and CTO RCA to L-R collaterals), hypertension, and diabetes type 2. She was initially seen in the [facility] by Gillian Shields, NP 01/29/2022. Per her notes, the patient was previously hospitalized 10/2018 and underwent LHC with occlusion of RCA with previously placed coronary stent and left-to-right collaterals; 50% ISR of proximal LAD and 90% diagonal ostial narrowing; also 60-70% obtuse marginal narrowing of 50% distal circumflex disease. She was recommended for medical therapy. Repeat catheterizations 10/2020 and 03/2021 were stable. Imdur was adjusted to 60 mg BID; ranolazine was trialed but discontinued after she developed a rash. She was seen 10/15/2021 and her amlodipine, HCTZ, and thyroid medications were on hold per her PCP but unclear why. Her BP was 203/103 initially, and 190/90 on recheck. She noted having an argument prior to the office visit and declined medication changes. HCTZ and amlodipine was resumed. She reported palpitations and wore a monitor showing predominantly NSR, and infrequent PVCs/PACs. Carotid dopplers 10/2021 showed no significant stenosis. In 01/2022 she developed worsening angina and was seen by Dr. Rennis Golden. She underwent cardiac catheterization 01/22/22  revealing stable multivessel coronary disease with CTO of proximal RCA collateralized from LCA, mild to moderate ISR of the LAD with previous negative flow wire, moderately severe OM stenosis and small OM branch with negative flow wire in the past.  It was suspected her symptoms were driven by supply/demand mismatch and she was recommended for more aggressive antihypertensive treatment. At her initial visit with Gillian Shields, NP her BP was 148/82. She was started on 12.5 mg carvedilol BID, and metoprolol was discontinued. She continued on Amlodipine 10mg  daily, HCTZ 25mg  QD, Imdur 60mg  BID. She was enrolled in the RPM study. Cortisol, catecholamines, metanephrines, and renin-aldosterone were normal. Renal artery dopplers 02/2022 revealed mild to moderate right renal artery stenosis without significant stenosis on the left. She reported snoring and was referred for a sleep study which was completed 01/2022. She also has known intolerance to Atorvastatin, Rosuvastatin and Repatha initiated. She followed up with our pharmacist 03/24/2022 and HCTZ was stopped. She was started on 25 mg chlorthalidone daily.   On 04/25/2022 she presented to the ED with complaints of chest tightness. She was noted to be hypertensive at 185/88. Troponins x2 were negative. Monitoring and EKG were negative for ischemia or arrhythmia. Her symptoms improved with albuterol use, and were thought to be due to some type of viral syndrome or reactive airway disease.  At her visit 05/2022 blood pressures were averaging in the 130s to 140s.  She was under a lot of stress from her relationship with her husband.  She was exercising regularly.  Chlorthalidone was switched to spironolactone and her potassium supplement was discontinued.  Carvedilol was increased to 25 mg twice daily.  She follow-up with Gillian Shields, NP 06/2022 and reported stopping spironolactone due to nausea and dehydration.  She was started on doxazosin.  She is not interested in starting chlorthalidone again.  She had a sleep study  06/2022 that showed no significant sleep apnea overall but did have some mild sleep apnea during REM sleep.  She did not meet criteria for CPAP therapy.  She has not been checking her blood pressure  consistently in the vivify remote patient monitoring system.  However when she does her blood pressure averages in the 120s to 140s over 60s to 70s.   Bethany Wang reports multiple recent episodes of passing out, occurring in various settings including at church, her girlfriend's house, and her own home, totaling five incidents in less than a week. She describes these episodes as sudden, with no preceding symptoms like sweating, which she used to experience before her previous surgery for a brain tumor. During these episodes, she appears to stop breathing and has a very low pulse, but recovers quickly and returns to normal behavior shortly after regaining consciousness.   She has a history of seizures and a brain tumor, for which she had undergone surgery. Her neurologist has indicated that she still experiences seizures, though they are described as "weird." Concerns about these episodes possibly being seizures have been raised again due to their recent increase in frequency and intensity.   She expresses significant concern about her blood pressure, which she monitors at home. It was reportedly extremely high in the morning at 298 over 120, but it decreases after her second dose of medication. She is considering adjustments to her medication timing and the reintroduction of chlorthalidone.   Additionally, Bethany Wang reports discomfort and pain in her lower back, suspecting a problem with her kidney or bladder. Although a recent urine analysis by Dr. Roger Shelter showed no signs of infection, she plans to undergo further testing and has an appointment with a urologist, Dr. Melina Fiddler, scheduled for next week.   Bethany Wang is also dealing with significant personal stress due to her brother's recent diagnosis of bone cancer, which has been emotionally taxing as she accompanies him to his treatments.    ROS: ***  Studies Reviewed: .        *** Risk Assessment/Calculations:   {Does this patient have  ATRIAL FIBRILLATION?[phone]} No BP recorded.  {Refresh Note OR Click here to enter BP  :1}***   STOP-Bang Score:  5  { Consider Dx Sleep Disordered Breathing or Sleep Apnea  ICD G47.33          :1}    Physical Exam:   VS:  There were no vitals taken for this visit.   Wt Readings from Last 3 Encounters:  11/05/22 148 lb (67.1 kg)  11/03/22 148 lb (67.1 kg)  10/29/22 148 lb 3.2 oz (67.2 kg)    GEN: Well nourished, well developed in no acute distress NECK: No JVD; No carotid bruits CARDIAC: ***RRR, no murmurs, rubs, gallops RESPIRATORY:  Clear to auscultation without rales, wheezing or rhonchi  ABDOMEN: Soft, non-tender, non-distended EXTREMITIES:  No edema; No deformity   ASSESSMENT AND PLAN: .   ***    {Are you ordering a CV Procedure (e.g. stress test, cath, DCCV, TEE, etc)?   Press F2        :[id number]}  Dispo: ***  Signed, Chilton Si, MD

## 2023-01-24 NOTE — Progress Notes (Incomplete)
Cardiology Office Note:  .    Date:  01/24/2023  ID:  Bethany Wang, DOB April 28, 1965, MRN [id number] PCP: Donetta Potts, MD   [facility] Providers Cardiologist:  Chrystie Nose, MD { Click to update primary MD,subspecialty MD or APP then REFRESH:1}    History of Present Illness: .    Bethany Wang is a 57 y.o. female with a hx of CAD (s/p DES to RCA 2013, DES to LAD 2014, cath 04/2016 and 10/2020 and 01/2022 patent LAD stent with jailed D2 by LAD stent and CTO RCA to L-R collaterals), hypertension, and diabetes type 2. She was initially seen in the [facility] by Gillian Shields, NP 01/29/2022. Per her notes, the patient was previously hospitalized 10/2018 and underwent LHC with occlusion of RCA with previously placed coronary stent and left-to-right collaterals; 50% ISR of proximal LAD and 90% diagonal ostial narrowing; also 60-70% obtuse marginal narrowing of 50% distal circumflex disease. She was recommended for medical therapy. Repeat catheterizations 10/2020 and 03/2021 were stable. Imdur was adjusted to 60 mg BID; ranolazine was trialed but discontinued after she developed a rash. She was seen 10/15/2021 and her amlodipine, HCTZ, and thyroid medications were on hold per her PCP but unclear why. Her BP was 203/103 initially, and 190/90 on recheck. She noted having an argument prior to the office visit and declined medication changes. HCTZ and amlodipine was resumed. She reported palpitations and wore a monitor showing predominantly NSR, and infrequent PVCs/PACs. Carotid dopplers 10/2021 showed no significant stenosis. In 01/2022 she developed worsening angina and was seen by Dr. Rennis Golden. She underwent cardiac catheterization 01/22/22  revealing stable multivessel coronary disease with CTO of proximal RCA collateralized from LCA, mild to moderate ISR of the LAD with previous negative flow wire, moderately severe OM stenosis and small OM branch with negative flow wire in the  past. It was suspected her symptoms were driven by supply/demand mismatch and she was recommended for more aggressive antihypertensive treatment. At her initial visit with Gillian Shields, NP her BP was 148/82. She was started on 12.5 mg carvedilol BID, and metoprolol was discontinued. She continued on Amlodipine 10mg  daily, HCTZ 25mg  QD, Imdur 60mg  BID. She was enrolled in the RPM study. Cortisol, catecholamines, metanephrines, and renin-aldosterone were normal. Renal artery dopplers 02/2022 revealed mild to moderate right renal artery stenosis without significant stenosis on the left. She reported snoring and was referred for a sleep study which was completed 01/2022. She also has known intolerance to Atorvastatin, Rosuvastatin and Repatha initiated. She followed up with our pharmacist 03/24/2022 and HCTZ was stopped. She was started on 25 mg chlorthalidone daily.   On 04/25/2022 she presented to the ED with complaints of chest tightness. She was noted to be hypertensive at 185/88. Troponins x2 were negative. Monitoring and EKG were negative for ischemia or arrhythmia. Her symptoms improved with albuterol use, and were thought to be due to some type of viral syndrome or reactive airway disease.  At her visit 05/2022 blood pressures were averaging in the 130s to 140s.  She was under a lot of stress from her relationship with her husband.  She was exercising regularly.  Chlorthalidone was switched to spironolactone and her potassium supplement was discontinued.  Carvedilol was increased to 25 mg twice daily.  She follow-up with Gillian Shields, NP 06/2022 and reported stopping spironolactone due to nausea and dehydration.  She was started on doxazosin.  She is not interested in starting chlorthalidone again.  She had a  sleep study 06/2022 that showed no significant sleep apnea overall but did have some mild sleep apnea during REM sleep.  She did not meet criteria for CPAP therapy.  She had not been checking her blood  pressure consistently in the vivify remote patient monitoring system.  However when she did her blood pressure averaged in the 120s to 140s over 60s to 70s.   At her visit 07/2022, she reported 5 syncopal incidents in less than a week occurring in various settings. She described these episodes as sudden, with no preceding symptoms. During these episodes, she appeared to stop breathing and had a very low pulse, but recovered to baseline shortly after regaining consciousness. She has a history of seizures and a brain tumor, for which she had undergone surgery. Concerns about these episodes possibly being seizures have been raised again due to the increase in frequency and intensity. She was referred to neurology for further evaluation. Her blood pressures had been elevated at home, with a reading of 298/120 that morning. We started chlorthalidone 25 mg daily with potassium 20 mEq daily. She would also start taking amlodipine in the evenings, and we assisted her with logging back into the [employer] system   Echo and 14 day monitor    [name] is also dealing with significant personal stress due to her brother's recent diagnosis of bone cancer, which has been emotionally taxing as she accompanies him to his treatments.  Today,  She denies any palpitations, chest pain, shortness of breath, peripheral edema, lightheadedness, headaches, syncope, orthopnea, or PND.  ROS:  Please see the history of present illness. All other systems are reviewed and negative.  (+)  Studies Reviewed: .        Right/Left Heart Cath  11/03/2022:   Dist LAD lesion is 50% stenosed.   Mid LAD-2 lesion is 30% stenosed.   Prox LAD lesion is 30% stenosed.   Prox Cx to Dist Cx lesion is 30% stenosed.   Prox RCA to Mid RCA lesion is 100% stenosed.   Ost 2nd Diag lesion is 75% stenosed.   2nd Diag lesion is 70% stenosed.   1st Mrg lesion is 75% stenosed.   2nd Mrg lesion is 20% stenosed.   Mid LAD-1 lesion is 50%  stenosed.   Stable multi-vessel CAD with no change since last cardiac cath.  Patent mid LAD stent with moderate restenosis-unchanged. (Previous flow wire negative) Moderate disease in the small Diagonal branch and small obtuse marginal branch-both too small for PCI.  Chronic total occlusion of the proximal RCA. The mid and distal RCA and distal branches fill from left to right collaterals.   RA 4, RV 34/2/3, PA 34/13 mean 20, PCWP 6, LVEDP 18 mmHg., AO 177/80 Uncontrolled HTN   Recommendations: Continue medical management of CAD. Aggressive approach to HTN control.   Echo  09/07/2022: Sonographer Comments: Image acquisition challenging due to respiratory  motion.  IMPRESSIONS   1. Left ventricular ejection fraction, by estimation, is 60 to 65%. The  left ventricle has normal function. The left ventricle has no regional  wall motion abnormalities. There is mild left ventricular hypertrophy.  Left ventricular diastolic parameters  are indeterminate.   2. Right ventricular systolic function is normal. The right ventricular  size is normal.   3. The mitral valve is normal in structure. No evidence of mitral valve  regurgitation. No evidence of mitral stenosis.   4. AI vena contracta is 0.5 cm. There is holodiastolic flow reversal in  the distal  aortic arch suggesting severe AI. AI vena contracta and  pressure half time more suggestive of moderate AI but toward severe end of  the spectrum. LVEF 60-65%, LVIDs 3.3. [name]   The aortic valve is tricuspid. Aortic valve regurgitation is moderate to  severe. No aortic stenosis is present.   5. Aortic dilatation noted. There is mild dilatation of the ascending  aorta, measuring 36 mm.   6. The inferior vena cava is dilated in size with <50% respiratory  variability, suggesting right atrial pressure of 15 mmHg.   13 Day Zio Monitor  08/2022: Quality: Fair.  Baseline artifact. Predominant rhythm: sinus rhythm Average heart rate: 71 bpm Max heart  rate: 120 bpm Min heart rate: 44 bpm Pauses >2.5 seconds: none   7 episodes of SVT lasting up to 12 beats Rare (<1%) PACs and PVCs Rare ventricular bigeminy  24 Hour Ambulatory Blood Pressure Monitor 08/11/2022:  Overall Average BP : 156/86 Overall Average HR: 74   Awake Average BP: 159/91  Awake Average HR: 78  Asleep Average BP: 150/75 Asleep Average HR: 65  White Coat Period:  mas 186/101, mean 174/95   Impressions: Uncontrolled hypertension during all times periods with superimposed white coat hypertension. There was not an appropriate nocturnal dip in blood pressure  Risk Assessment/Calculations:   {Does this patient have ATRIAL FIBRILLATION?:[phone]} No BP recorded.  {Refresh Note OR Click here to enter BP  :1}***   STOP-Bang Score:  5  { Consider Dx Sleep Disordered Breathing or Sleep Apnea  ICD G47.33          :1}    Physical Exam:    VS:  There were no vitals taken for this visit. , BMI There is no height or weight on file to calculate BMI. GENERAL:  Well appearing HEENT: Pupils equal round and reactive, fundi not visualized, oral mucosa unremarkable NECK:  No jugular venous distention, waveform within normal limits, carotid upstroke brisk and symmetric, no bruits, no thyromegaly LYMPHATICS:  No cervical adenopathy LUNGS:  Clear to auscultation bilaterally HEART:  RRR.  PMI not displaced or sustained,S1 and S2 within normal limits, no S3, no S4, no clicks, no rubs, *** murmurs ABD:  Flat, positive bowel sounds normal in frequency in pitch, no bruits, no rebound, no guarding, no midline pulsatile mass, no hepatomegaly, no splenomegaly EXT:  2 plus pulses throughout, no edema, no cyanosis no clubbing SKIN:  No rashes no nodules NEURO:  Cranial nerves II through XII grossly intact, motor grossly intact throughout PSYCH:  Cognitively intact, oriented to person place and time  Wt Readings from Last 3 Encounters:  11/05/22 148 lb (67.1 kg)  11/03/22 148 lb (67.1 kg)   10/29/22 148 lb 3.2 oz (67.2 kg)     ASSESSMENT AND PLAN: .    No problem-specific Assessment & Plan notes found for this encounter.   ***Plan: -     {Are you ordering a CV Procedure (e.g. stress test, cath, DCCV, TEE, etc)?   Press F2        :[id number]}  Dispo:  FU with Tiffany C. Duke Salvia, MD, [facility] in ***  I,Mathew Stumpf,acting as a scribe for Chilton Si, MD.,have documented all relevant documentation on the behalf of Chilton Si, MD,as directed by  Chilton Si, MD while in the presence of Chilton Si, MD.  ***  Signed, Carlena Bjornstad

## 2023-02-03 ENCOUNTER — Ambulatory Visit: Payer: 59 | Admitting: Nurse Practitioner

## 2023-02-17 ENCOUNTER — Ambulatory Visit (HOSPITAL_BASED_OUTPATIENT_CLINIC_OR_DEPARTMENT_OTHER): Payer: 59 | Admitting: Pharmacist Clinician (PhC)/ Clinical Pharmacy Specialist

## 2023-02-17 ENCOUNTER — Ambulatory Visit (HOSPITAL_BASED_OUTPATIENT_CLINIC_OR_DEPARTMENT_OTHER): Payer: 59

## 2023-02-17 VITALS — BP 163/88 | HR 70 | Wt 149.7 lb

## 2023-02-17 DIAGNOSIS — I1A0 Resistant hypertension: Secondary | ICD-10-CM

## 2023-02-17 DIAGNOSIS — I25119 Atherosclerotic heart disease of native coronary artery with unspecified angina pectoris: Secondary | ICD-10-CM | POA: Diagnosis not present

## 2023-02-17 MED ORDER — AMLODIPINE BESYLATE 5 MG PO TABS: 5.0000 mg | ORAL_TABLET | Freq: Every day | ORAL | 3 refills | Status: DC

## 2023-02-17 MED ORDER — CLOPIDOGREL BISULFATE 75 MG PO TABS: 75.0000 mg | ORAL_TABLET | Freq: Every day | ORAL | 3 refills | Status: AC

## 2023-02-17 MED ORDER — CARVEDILOL 6.25 MG PO TABS: 6.2500 mg | ORAL_TABLET | Freq: Two times a day (BID) | ORAL | 3 refills | Status: DC

## 2023-02-17 MED ORDER — ASPIRIN 81 MG PO TBEC: 81.0000 mg | DELAYED_RELEASE_TABLET | Freq: Every day | ORAL | 3 refills | Status: AC

## 2023-02-17 NOTE — Progress Notes (Unsigned)
Office Visit    Patient Name: Bethany Wang Date of Encounter: 02/20/2023  Primary Care Provider:  Donetta Potts, MD Primary Cardiologist:  Chrystie Nose, MD  Chief Complaint    Hypertension  Significant Past Medical History   HTN On amlodipine, chlorthalidone, doxazosin, metoprolol tart,   HLD 7/24 LDL 61 on evolocumab  CAD Multiple stents, should be on clopidogrel, asa  Vit D deficiency 3/22 at 22       Allergies  Allergen Reactions   Bee Venom Anaphylaxis and Other (See Comments)    "throat swelled and had to the hospital" - Yellow jacket sting   Lipitor [Atorvastatin] Rash and Other (See Comments)    Liver damage & Upper Abdominal pain, Break out like a rash . Per last hosp visit. It exposed on inpatient stay.   Penicillins Anaphylaxis   Shellfish Allergy Swelling   Spironolactone Other (See Comments)    Nausea, dehydration   Tomato Itching and Swelling   Valsartan Itching   Zofran [Ondansetron] Itching   Crestor [Rosuvastatin] Rash and Other (See Comments)    Upper Abdominal pain   Ranolazine Rash    History of Present Illness    Bethany Wang is a 57 y.o. female patient of Dr Rennis Golden, in the office today with concerns about her medications.   She states that there was some confusion at her pharmacy about which medications she should be taking.  She states her pharmacist told her she would need to speak to her MD's, so for the past several weeks, she has not taken any medication except for Repatha.    Blood Pressure Goal:  130/80  Current Medications:   Previously tried:  valsartan - itching, spironolactone - nausea/dehydration   Social Hx:      Tobacco: former smoker, quit in 2013  Alcohol: no  Accessory Clinical Findings    Lab Results  Component Value Date   CREATININE 0.81 11/05/2022   BUN 15 11/05/2022   NA 138 11/05/2022   K 3.6 11/05/2022   CL 105 11/05/2022   CO2 22 11/05/2022   Lab Results  Component Value Date   ALT 22  11/05/2022   AST 32 11/05/2022   ALKPHOS 124 11/05/2022   BILITOT 1.0 11/05/2022   Lab Results  Component Value Date   HGBA1C 5.1 10/29/2019    Home Medications    Current Outpatient Medications  Medication Sig Dispense Refill   amLODipine (NORVASC) 5 MG tablet Take 1 tablet (5 mg total) by mouth daily. 90 tablet 3   aspirin EC 81 MG tablet Take 1 tablet (81 mg total) by mouth daily. Swallow whole. 90 tablet 3   carvedilol (COREG) 6.25 MG tablet Take 1 tablet (6.25 mg total) by mouth 2 (two) times daily. 180 tablet 3   clopidogrel (PLAVIX) 75 MG tablet Take 1 tablet (75 mg total) by mouth daily. 90 tablet 3   Evolocumab (REPATHA SURECLICK [place]) Inject 140 mg into the skin every 14 (fourteen) days.     albuterol (VENTOLIN HFA) 108 (90 Base) MCG/ACT inhaler Inhale 2 puffs into the lungs every 4 (four) hours as needed for wheezing or shortness of breath. (Patient taking differently: Inhale 2 puffs into the lungs as needed for wheezing or shortness of breath.) 1 each 3   ALPRAZolam (XANAX) 0.5 MG tablet Take 1 tablet (0.5 mg total) by mouth 2 (two) times daily as needed for anxiety. (Patient taking differently: Take 0.5 mg by mouth daily as needed for anxiety.)  30 tablet 0   Cholecalciferol (DIALYVITE VITAMIN D 5000) 125 MCG (5000 UT) capsule Take 5,000 Units by mouth in the morning. (Patient not taking: Reported on 02/17/2023)     escitalopram (LEXAPRO) 20 MG tablet Take 1 tablet (20 mg total) by mouth daily. 30 tablet 3   fluorometholone (FML) 0.1 % ophthalmic suspension Place 1 drop into both eyes daily.     fluticasone-salmeterol (ADVAIR) 100-50 MCG/ACT AEPB INHALE 1 PUFF BY MOUTH TWICE DAILY (Patient taking differently: Inhale 1 puff into the lungs as needed (wheezing/SOB).) 60 each 10   gabapentin (NEURONTIN) 100 MG capsule Take 1 capsule (100 mg total) by mouth 3 (three) times daily. 30 capsule 0   linaclotide (LINZESS) 145 MCG CAPS capsule Take 145 mcg by mouth daily before breakfast.      nitroGLYCERIN (NITROSTAT) 0.4 MG SL tablet DISSOLVE 1 TABLET UNDER THE TONGUE AS NEEDED FOR CHEST PAIN EVERY 5 MINUTES UP TO 3 TIMES. IF NO RELIEF CALL 911. (Patient taking differently: Place 0.4 mg under the tongue every 5 (five) minutes as needed for chest pain.) 25 tablet 1   NP THYROID 60 MG tablet Take 60 mg by mouth every morning. (Patient not taking: Reported on 11/03/2022)     prochlorperazine (COMPAZINE) 10 MG tablet Take 1 tablet (10 mg total) by mouth every 6 (six) hours as needed for nausea or vomiting. (Patient not taking: Reported on 11/03/2022) 90 tablet 1   RESTASIS 0.05 % ophthalmic emulsion Place 2 drops into both eyes as needed (dry eye).     traZODone (DESYREL) 50 MG tablet Take 50 mg by mouth at bedtime as needed for sleep.     No current facility-administered medications for this visit.     Assessment & Plan    Resistant hypertension Assessment: BP is un/controlled in office BP 163/88 mmHg;  above the goal (<130/80). No BP medications for the past month Denies SOB, palpitation, chest pain, headaches,or swelling Reiterated the importance of regular exercise and low salt diet   Plan:  Start taking amlodipine 5 mg once daily and carvedilol 6.25 mg bid Patient to keep record of BP readings with heart rate and report to [place] at the next visit Patient to follow up with PharmD in 6 weeks  Labs ordered today:  none   Coronary artery disease involving native coronary artery of native heart with angina pectoris [facility]) Patient with history of multiple stents, currently not on any medications.  Will have her re-start clopdiogrel 75 mg every day as well as ASA 81 mg every day    Phillips Hay PharmD CPP [facility]  [address] [place], [place] [address] [phone]

## 2023-02-17 NOTE — Patient Instructions (Signed)
Follow up appointment: 6 weeks  Take your BP meds as follows:  For Blood pressure - take amlodipine 5 mg once daily     Take carvedilol 6.25 mg twice daily   For heart disease - take clopidogrel 75 mg once daily          Take aspirin 81 mg once daily  Check your blood pressure at home daily (if able) and keep record of the readings.  Hypertension "High blood pressure"  Hypertension is often called "The Silent Killer." It rarely causes symptoms until it is extremely  high or has done damage to other organs in the body. For this reason, you should have your  blood pressure checked regularly by your physician. We will check your blood pressure  every time you see a provider at one of our offices.   Your blood pressure reading consists of two numbers. Ideally, blood pressure should be  below 120/80. The first ("top") number is called the systolic pressure. It measures the  pressure in your arteries as your heart beats. The second ("bottom") number is called the diastolic pressure. It measures the pressure in your arteries as the heart relaxes between beats.  The benefits of getting your blood pressure under control are enormous. A 10-point  reduction in systolic blood pressure can reduce your risk of stroke by 27% and heart failure by 28%  Your blood pressure goal is < 130/80  To check your pressure at home you will need to:  1. Sit up in a chair, with feet flat on the floor and back supported. Do not cross your ankles or legs. 2. Rest your left arm so that the cuff is about heart level. If the cuff goes on your upper arm,  then just relax the arm on the table, arm of the chair or your lap. If you have a wrist cuff, we  suggest relaxing your wrist against your chest (think of it as Pledging the Flag with the  wrong arm).  3. Place the cuff snugly around your arm, about 1 inch above the crook of your elbow. The  cords should be inside the groove of your elbow.  4. Sit quietly, with  the cuff in place, for about 5 minutes. After that 5 minutes press the power  button to start a reading. 5. Do not talk or move while the reading is taking place.  6. Record your readings on a sheet of paper. Although most cuffs have a memory, it is often  easier to see a pattern developing when the numbers are all in front of you.  7. You can repeat the reading after 1-3 minutes if it is recommended  Make sure your bladder is empty and you have not had caffeine or tobacco within the last 30 min  Always bring your blood pressure log with you to your appointments. If you have not brought your monitor in to be double checked for accuracy, please bring it to your next appointment.  You can find a list of quality blood pressure cuffs at validatebp.org

## 2023-02-20 ENCOUNTER — Encounter (HOSPITAL_BASED_OUTPATIENT_CLINIC_OR_DEPARTMENT_OTHER): Payer: Self-pay | Admitting: Pharmacist Clinician (PhC)/ Clinical Pharmacy Specialist

## 2023-02-20 NOTE — Assessment & Plan Note (Signed)
Patient with history of multiple stents, currently not on any medications.  Will have her re-start clopdiogrel 75 mg every day as well as ASA 81 mg every day

## 2023-02-20 NOTE — Assessment & Plan Note (Signed)
Assessment: BP is un/controlled in office BP 163/88 mmHg;  above the goal (<130/80). No BP medications for the past month Denies SOB, palpitation, chest pain, headaches,or swelling Reiterated the importance of regular exercise and low salt diet   Plan:  Start taking amlodipine 5 mg once daily and carvedilol 6.25 mg bid Patient to keep record of BP readings with heart rate and report to [place] at the next visit Patient to follow up with PharmD in 6 weeks  Labs ordered today:  none

## 2023-02-22 ENCOUNTER — Ambulatory Visit (INDEPENDENT_AMBULATORY_CARE_PROVIDER_SITE_OTHER): Payer: 59

## 2023-02-22 DIAGNOSIS — D497 Neoplasm of unspecified behavior of endocrine glands and other parts of nervous system: Secondary | ICD-10-CM | POA: Diagnosis not present

## 2023-02-22 DIAGNOSIS — R739 Hyperglycemia, unspecified: Secondary | ICD-10-CM | POA: Diagnosis not present

## 2023-02-22 DIAGNOSIS — E559 Vitamin D deficiency, unspecified: Secondary | ICD-10-CM | POA: Diagnosis not present

## 2023-02-22 DIAGNOSIS — Z23 Encounter for immunization: Secondary | ICD-10-CM | POA: Diagnosis not present

## 2023-02-22 DIAGNOSIS — T466X5A Adverse effect of antihyperlipidemic and antiarteriosclerotic drugs, initial encounter: Secondary | ICD-10-CM | POA: Diagnosis not present

## 2023-02-22 DIAGNOSIS — I701 Atherosclerosis of renal artery: Secondary | ICD-10-CM

## 2023-02-22 DIAGNOSIS — I509 Heart failure, unspecified: Secondary | ICD-10-CM | POA: Diagnosis not present

## 2023-02-22 DIAGNOSIS — E785 Hyperlipidemia, unspecified: Secondary | ICD-10-CM | POA: Diagnosis not present

## 2023-02-22 DIAGNOSIS — G720 Drug-induced myopathy: Secondary | ICD-10-CM | POA: Diagnosis not present

## 2023-02-22 DIAGNOSIS — I2510 Atherosclerotic heart disease of native coronary artery without angina pectoris: Secondary | ICD-10-CM | POA: Diagnosis not present

## 2023-02-22 DIAGNOSIS — E039 Hypothyroidism, unspecified: Secondary | ICD-10-CM | POA: Diagnosis not present

## 2023-02-22 DIAGNOSIS — I10 Essential (primary) hypertension: Secondary | ICD-10-CM | POA: Diagnosis not present

## 2023-02-24 ENCOUNTER — Telehealth (HOSPITAL_BASED_OUTPATIENT_CLINIC_OR_DEPARTMENT_OTHER): Payer: Self-pay

## 2023-02-24 NOTE — Telephone Encounter (Signed)
Called patient and left voicemail of results. (Ok per [facility])  ----- Message from Bethany Wang sent at 02/24/2023  8:56 AM EST ----- No renal artery stenosis. Good result!

## 2023-02-24 NOTE — Telephone Encounter (Signed)
-----   Message from Alver Sorrow sent at 02/24/2023  8:56 AM EST ----- No renal artery stenosis. Good result!

## 2023-03-07 DIAGNOSIS — I2510 Atherosclerotic heart disease of native coronary artery without angina pectoris: Secondary | ICD-10-CM | POA: Diagnosis not present

## 2023-03-07 DIAGNOSIS — I701 Atherosclerosis of renal artery: Secondary | ICD-10-CM | POA: Diagnosis not present

## 2023-03-07 DIAGNOSIS — I351 Nonrheumatic aortic (valve) insufficiency: Secondary | ICD-10-CM | POA: Diagnosis not present

## 2023-03-07 DIAGNOSIS — I77810 Thoracic aortic ectasia: Secondary | ICD-10-CM | POA: Diagnosis not present

## 2023-03-07 NOTE — Progress Notes (Signed)
Cardiology Office Note:  .   Date:  03/21/2023  ID:  Bethany Wang, DOB 04-07-1966, MRN [id number] PCP: Donetta Potts, MD  [facility] Providers Cardiologist:  Chrystie Nose, MD    History of Present Illness: .   Bethany Wang is a 57 y.o. female with history of CAD status post DES to the RCA in 2013 DES to the LAD in 2014, cath 04/2016 patent LAD stent with jailed D2 by the LAD stent and CTO of RCA with left-to-right collaterals.  Also has hypertension, DM type II, HLD.     Patient was admitted to [facility] 10/22/2020 with unstable angina and severe HTN.  Cardiac cath showed total occlusion of the RCA within the previously placed stent, widely patent left main, 70% OM1, 50 to 60% distal circumflex, LAD proximal stent diffuse ISR with 50 to 70% restenosis eccentric RFR was 0.92.  Medical therapy recommended.  Patient signed out AMA.   Repeat cath 10/2022 stable multi-vessel CAD no change, continue medical management.  Patient seen in HTN clinic 02/17/23 with elevated BP's after being out of meds for a month. Started on amlodipine 5 mg daily and carvedilol 6.25 mg bid.  Patient comes in with her daughter. They say Dr. Wolfgang Phoenix stopped amlodipine and ordered nifedipine but she hasn't started yet until she spoke with [place]. Overall she feels pretty good. She thinks the repatha is causing cold like symptoms with cough/phlegm, night sweats.House has mold issues and roof falling in. She's trying to move.  Low energy. She says when she does something she gets chest heaviness. Also occurs when she lays down.     ROS:   Studies Reviewed: [name]         Prior CV Studies: ECHO COMPLETE WO IMAGING ENHANCING AGENT 09/07/2022  Narrative ECHOCARDIOGRAM REPORT    Patient Name:   Bethany Wang Date of Exam: 09/07/2022 Medical Rec #:  [id number]       Height:       64.0 in Accession #:    [id number]      Weight:       142.0 lb Date of Birth:  10-18-1965      BSA:          1.691 m Patient  Age:    56 years        BP:           173/90 mmHg Patient Gender: F               HR:           51 bpm. [facility]  Procedure: 2D Echo, Cardiac Doppler and Color Doppler  Indications:    Syncope  History:        Patient has prior history of Echocardiogram examinations, most recent 10/23/2020. Previous Myocardial Infarction and CAD, Stroke; Risk Factors:Dyslipidemia, Hypertension and Diabetes. CA, hx of chemotherapy/radiation.  Sonographer:    Milda Smart Referring Phys: [id number] TIFFANY [place]   Sonographer Comments: Image acquisition challenging due to respiratory motion. IMPRESSIONS   1. Left ventricular ejection fraction, by estimation, is 60 to 65%. The left ventricle has normal function. The left ventricle has no regional wall motion abnormalities. There is mild left ventricular hypertrophy. Left ventricular diastolic parameters are indeterminate. 2. Right ventricular systolic function is normal. The right ventricular size is normal. 3. The mitral valve is normal in structure. No evidence of mitral valve regurgitation. No evidence of mitral stenosis. 4. AI vena contracta  is 0.5 cm. There is holodiastolic flow reversal in the distal aortic arch suggesting severe AI. AI vena contracta and pressure half time more suggestive of moderate AI but toward severe end of the spectrum. LVEF 60-65%, LVIDs 3.3. [name] The aortic valve is tricuspid. Aortic valve regurgitation is moderate to severe. No aortic stenosis is present. 5. Aortic dilatation noted. There is mild dilatation of the ascending aorta, measuring 36 mm. 6. The inferior vena cava is dilated in size with <50% respiratory variability, suggesting right atrial pressure of 15 mmHg.  FINDINGS Left Ventricle: Left ventricular ejection fraction, by estimation, is 60 to 65%. The left ventricle has normal function. The left ventricle has no regional wall motion abnormalities. The left ventricular internal cavity size was  normal in size. There is mild left ventricular hypertrophy. Left ventricular diastolic parameters are indeterminate.  Right Ventricle: The right ventricular size is normal. Right vetricular wall thickness was not well visualized. Right ventricular systolic function is normal.  Left Atrium: Left atrial size was normal in size.  Right Atrium: Right atrial size was normal in size.  Pericardium: There is no evidence of pericardial effusion.  Mitral Valve: The mitral valve is normal in structure. No evidence of mitral valve regurgitation. No evidence of mitral valve stenosis.  Tricuspid Valve: The tricuspid valve is normal in structure. Tricuspid valve regurgitation is not demonstrated. No evidence of tricuspid stenosis.  Aortic Valve: AI vena contracta is 0.5 cm. There is holodiastolic flow reversal in the distal aortic arch suggesting severe AI. AI vena contracta and pressure half time more suggestive of moderate AI but toward severe end of the spectrum. LVEF 60-65%, LVIDs 3.3. The aortic valve is tricuspid. Aortic valve regurgitation is moderate to severe. Aortic regurgitation PHT measures 874 msec. No aortic stenosis is present. Aortic valve mean gradient measures 5.1 mmHg. Aortic valve peak gradient measures 12.3 mmHg. Aortic valve area, by VTI measures 2.98 cm.  Pulmonic Valve: The pulmonic valve was not well visualized. Pulmonic valve regurgitation is not visualized. No evidence of pulmonic stenosis.  Aorta: Aortic dilatation noted and the aortic root is normal in size and structure. There is mild dilatation of the ascending aorta, measuring 36 mm.  Venous: The inferior vena cava is dilated in size with less than 50% respiratory variability, suggesting right atrial pressure of 15 mmHg.  IAS/Shunts: No atrial level shunt detected by color flow Doppler.   LEFT VENTRICLE PLAX 2D LVIDd:         4.70 cm      Diastology LVIDs:         3.30 cm      LV e' medial:    6.74 cm/s LV PW:          1.10 cm      LV E/e' medial:  10.1 LV IVS:        1.10 cm      LV e' lateral:   6.64 cm/s LVOT diam:     2.10 cm      LV E/e' lateral: 10.2 LV SV:         110 LV SV Index:   65 LVOT Area:     3.46 cm  LV Volumes (MOD) LV vol d, MOD A2C: 105.0 ml LV vol d, MOD A4C: 94.9 ml LV vol s, MOD A2C: 30.7 ml LV vol s, MOD A4C: 33.0 ml LV SV MOD A2C:     74.3 ml LV SV MOD A4C:     94.9 ml LV SV MOD  BP:      68.5 ml  RIGHT VENTRICLE RV S prime:     8.70 cm/s TAPSE (M-mode): 1.3 cm  LEFT ATRIUM             Index        RIGHT ATRIUM           Index LA diam:        2.60 cm 1.54 cm/m   RA Area:     13.80 cm LA Vol (A2C):   53.9 ml 31.87 ml/m  RA Volume:   39.10 ml  23.12 ml/m LA Vol (A4C):   28.0 ml 16.55 ml/m LA Biplane Vol: 40.7 ml 24.06 ml/m AORTIC VALVE AV Area (Vmax):    2.92 cm AV Area (Vmean):   2.92 cm AV Area (VTI):     2.98 cm AV Vmax:           175.50 cm/s AV Vmean:          104.045 cm/s AV VTI:            0.370 m AV Peak Grad:      12.3 mmHg AV Mean Grad:      5.1 mmHg LVOT Vmax:         148.00 cm/s LVOT Vmean:        87.700 cm/s LVOT VTI:          0.318 m LVOT/AV VTI ratio: 0.86 AI PHT:            874 msec  AORTA Ao Root diam: 3.50 cm Ao Asc diam:  3.60 cm  MITRAL VALVE               TRICUSPID VALVE MV Area (PHT): 3.58 cm    TR Peak grad:   7.5 mmHg MV Decel Time: 212 msec    TR Vmax:        137.00 cm/s MR Peak grad: 59.0 mmHg MR Vmax:      384.00 cm/s  SHUNTS MV E velocity: 67.90 cm/s  Systemic VTI:  0.32 m MV A velocity: 53.00 cm/s  Systemic Diam: 2.10 cm MV E/A ratio:  1.28  Dina Rich MD Electronically signed by Dina Rich MD Signature Date/Time: 09/07/2022/2:52:54 PM    Final    Cath 10/2022  Dist LAD lesion is 50% stenosed.   Mid LAD-2 lesion is 30% stenosed.   Prox LAD lesion is 30% stenosed.   Prox Cx to Dist Cx lesion is 30% stenosed.   Prox RCA to Mid RCA lesion is 100% stenosed.   Ost 2nd Diag lesion is 75% stenosed.    2nd Diag lesion is 70% stenosed.   1st Mrg lesion is 75% stenosed.   2nd Mrg lesion is 20% stenosed.   Mid LAD-1 lesion is 50% stenosed.   Stable multi-vessel CAD with no change since last cardiac cath.  Patent mid LAD stent with moderate restenosis-unchanged. (Previous flow wire negative) Moderate disease in the small Diagonal branch and small obtuse marginal branch-both too small for PCI.  Chronic total occlusion of the proximal RCA. The mid and distal RCA and distal branches fill from left to right collaterals.   RA 4, RV 34/2/3, PA 34/13 mean 20, PCWP 6, LVEDP 18 mmHg., AO 177/80 Uncontrolled HTN   Recommendations: Continue medical management of CAD. Aggressive approach to HTN control.   Echo 08/2022  IMPRESSIONS     1. Left ventricular ejection fraction, by estimation, is 60 to 65%. The  left ventricle has normal  function. The left ventricle has no regional  wall motion abnormalities. There is mild left ventricular hypertrophy.  Left ventricular diastolic parameters  are indeterminate.   2. Right ventricular systolic function is normal. The right ventricular  size is normal.   3. The mitral valve is normal in structure. No evidence of mitral valve  regurgitation. No evidence of mitral stenosis.   4. AI vena contracta is 0.5 cm. There is holodiastolic flow reversal in  the distal aortic arch suggesting severe AI. AI vena contracta and  pressure half time more suggestive of moderate AI but toward severe end of  the spectrum. LVEF 60-65%, LVIDs 3.3. [name]   The aortic valve is tricuspid. Aortic valve regurgitation is moderate to  severe. No aortic stenosis is present.   5. Aortic dilatation noted. There is mild dilatation of the ascending  aorta, measuring 36 mm.   6. The inferior vena cava is dilated in size with <50% respiratory  variability, suggesting right atrial pressure of 15 mmHg.    Risk Assessment/Calculations:     HYPERTENSION CONTROL Vitals:   03/21/23 1221  03/21/23 1248  BP: (!) 152/90 (!) 180/80    The patient's blood pressure is elevated above target today.  In order to address the patient's elevated BP: Blood pressure will be monitored at home to determine if medication changes need to be made.; A current anti-hypertensive medication was adjusted today.; A new medication was prescribed today.; A referral to the PharmD Hypertension Clinic will be placed.      STOP-Bang Score:         Physical Exam:   VS:  BP (!) 180/80   Pulse 73   Ht 5\' 4"  (1.626 m)   Wt 151 lb 3.2 oz (68.6 kg)   SpO2 98%   BMI 25.95 kg/m    Wt Readings from Last 3 Encounters:  03/21/23 151 lb 3.2 oz (68.6 kg)  02/17/23 149 lb 11.2 oz (67.9 kg)  11/05/22 148 lb (67.1 kg)    GEN: Well nourished, well developed in no acute distress NECK: No JVD; No carotid bruits CARDIAC:  RRR, 2/6 diastolic murmur LSB,no rubs, gallops RESPIRATORY:  Clear to auscultation without rales, wheezing or rhonchi  ABDOMEN: Soft, non-tender, non-distended EXTREMITIES:  No edema; No deformity   ASSESSMENT AND PLAN: .   CAD with prior DES RCA 2013, DES LAD 2014 cath 10/23/20 was found to have total occlusion of RCA within the previously placed stent, LAD proximal stent diffuse ISR with 50 to 70% restenosis eccentric RFR 0.92 medical management recommended.  Patient signed out AMA.  Cath 10/2022 no change. Ongoing chest pain with elevated BP's. Will add nifedipine as requested by renal.   Essential hypertension blood pressure running high, renal stopped amlodipine and recommends nifedipine. Will Rx and increase coreg 12.5 mg bid.   Hyperlipidemia on repatha   Palpitations with PSVT and NSVT on Holter monitor 08/2020 on metoprolol 25 mg twice daily.     Moderate to severe AI on echo 08/2022.needs better BP control.        Dispo: keep scheduled f/u with HTN clinic.  Signed, Jacolyn Reedy, PA-C

## 2023-03-15 DIAGNOSIS — I10 Essential (primary) hypertension: Secondary | ICD-10-CM | POA: Diagnosis not present

## 2023-03-15 DIAGNOSIS — I77810 Thoracic aortic ectasia: Secondary | ICD-10-CM | POA: Diagnosis not present

## 2023-03-15 DIAGNOSIS — N189 Chronic kidney disease, unspecified: Secondary | ICD-10-CM | POA: Diagnosis not present

## 2023-03-15 DIAGNOSIS — I701 Atherosclerosis of renal artery: Secondary | ICD-10-CM | POA: Diagnosis not present

## 2023-03-15 DIAGNOSIS — I2510 Atherosclerotic heart disease of native coronary artery without angina pectoris: Secondary | ICD-10-CM | POA: Diagnosis not present

## 2023-03-15 DIAGNOSIS — R809 Proteinuria, unspecified: Secondary | ICD-10-CM | POA: Diagnosis not present

## 2023-03-21 ENCOUNTER — Encounter: Payer: Self-pay | Admitting: Physician Assistant

## 2023-03-21 ENCOUNTER — Ambulatory Visit: Payer: 59 | Attending: Cardiovascular Disease | Admitting: Physician Assistant

## 2023-03-21 VITALS — BP 180/80 | HR 73 | Ht 64.0 in | Wt 151.2 lb

## 2023-03-21 DIAGNOSIS — E782 Mixed hyperlipidemia: Secondary | ICD-10-CM | POA: Diagnosis not present

## 2023-03-21 DIAGNOSIS — R002 Palpitations: Secondary | ICD-10-CM | POA: Diagnosis not present

## 2023-03-21 DIAGNOSIS — I1A0 Resistant hypertension: Secondary | ICD-10-CM | POA: Diagnosis not present

## 2023-03-21 DIAGNOSIS — I351 Nonrheumatic aortic (valve) insufficiency: Secondary | ICD-10-CM

## 2023-03-21 DIAGNOSIS — I25119 Atherosclerotic heart disease of native coronary artery with unspecified angina pectoris: Secondary | ICD-10-CM

## 2023-03-21 MED ORDER — CARVEDILOL 12.5 MG PO TABS: 12.5000 mg | ORAL_TABLET | Freq: Two times a day (BID) | ORAL | 3 refills | Status: DC

## 2023-03-21 MED ORDER — NIFEDIPINE ER OSMOTIC RELEASE 30 MG PO TB24: 30.0000 mg | ORAL_TABLET | Freq: Every day | ORAL | 3 refills | Status: DC

## 2023-03-21 NOTE — Patient Instructions (Signed)
Medication Instructions:  Your physician has recommended you make the following change in your medication:   -Start Nifedipine 30 mg tablet once daily. -Increase Coreg to 12.5 mg twice daily.    *If you need a refill on your cardiac medications before your next appointment, please call your pharmacy*   Lab Work: None If you have labs (blood work) drawn today and your tests are completely normal, you will receive your results only by: MyChart Message (if you have MyChart) OR A paper copy in the mail If you have any lab test that is abnormal or we need to change your treatment, we will call you to review the results.   Testing/Procedures: None   Follow-Up: At [facility], you and your health needs are our priority.  As part of our continuing mission to provide you with exceptional heart care, we have created designated Provider Care Teams.  These Care Teams include your primary Cardiologist (physician) and Advanced Practice Providers (APPs -  Physician Assistants and Nurse Practitioners) who all work together to provide you with the care you need, when you need it.  We recommend signing up for the patient portal called "MyChart".  Sign up information is provided on this After Visit Summary.  MyChart is used to connect with patients for Virtual Visits (Telemedicine).  Patients are able to view lab/test results, encounter notes, upcoming appointments, etc.  Non-urgent messages can be sent to your provider as well.   To learn more about what you can do with MyChart, go to [email or web address].    Your next appointment:    Keep follow up with Hypertension Clinic.   Other Instructions

## 2023-04-01 ENCOUNTER — Ambulatory Visit (HOSPITAL_BASED_OUTPATIENT_CLINIC_OR_DEPARTMENT_OTHER): Payer: 59

## 2023-04-01 NOTE — Progress Notes (Deleted)
Office Visit    Patient Name: [name] Date of Encounter: 04/01/2023  Primary Care Provider:  Donetta Potts, MD Primary Cardiologist:  Bethany Nose, MD  Chief Complaint    Hypertension  Significant Past Medical History   HTN On amlodipine, chlorthalidone, doxazosin, metoprolol tart,   HLD 7/24 LDL 61 on evolocumab  CAD Multiple stents, should be on clopidogrel, asa  Vit D deficiency 3/22 at 22       Allergies  Allergen Reactions   Bee Venom Anaphylaxis and Other (See Comments)    "throat swelled and had to the hospital" - Yellow jacket sting   Lipitor [Atorvastatin] Rash and Other (See Comments)    Liver damage & Upper Abdominal pain, Break out like a rash . Per last hosp visit. It exposed on inpatient stay.   Penicillins Anaphylaxis   Shellfish Allergy Swelling   Spironolactone Other (See Comments)    Nausea, dehydration   Tomato Itching and Swelling   Valsartan Itching   Zofran [Ondansetron] Itching   Crestor [Rosuvastatin] Rash and Other (See Comments)    Upper Abdominal pain   Ranolazine Rash    History of Present Illness    Bethany Wang is a 57 y.o. female patient of Dr Bethany Wang, in the office today with concerns about her medications.   She states that there was some confusion at her pharmacy about which medications she should be taking.  She states her pharmacist told her she would need to speak to her MD's, so for the past several weeks, she has not taken any medication except for Repatha.  When I saw her in November we restarted the amlodipine at 5 mg daily and carvedilol at 6.25 mg bid.  She was later seen by nephrology who switched the amlodipine to nifedipine xl 30 mg daily.  She was seen by Bethany Wang earlier this month and pressure remained elevated at 152/90.  Carvedilol was increased to 12.5 mg.  Today she is in the office for follow up.    Blood Pressure Goal:  130/80  Current Medications:  nifedipine xl 30 mg every day,  carvedilol 12.5 mg bid  Previously tried:  valsartan - itching, spironolactone - nausea/dehydration   Social Hx:      Tobacco: former smoker, quit in 2013  Alcohol: no  Accessory Clinical Findings    Lab Results  Component Value Date   CREATININE 0.81 11/05/2022   BUN 15 11/05/2022   NA 138 11/05/2022   K 3.6 11/05/2022   CL 105 11/05/2022   CO2 22 11/05/2022   Lab Results  Component Value Date   ALT 22 11/05/2022   AST 32 11/05/2022   ALKPHOS 124 11/05/2022   BILITOT 1.0 11/05/2022   Lab Results  Component Value Date   HGBA1C 5.1 10/29/2019    Home Medications    Current Outpatient Medications  Medication Sig Dispense Refill   albuterol (VENTOLIN HFA) 108 (90 Base) MCG/ACT inhaler Inhale 2 puffs into the lungs every 4 (four) hours as needed for wheezing or shortness of breath. (Patient taking differently: Inhale 2 puffs into the lungs as needed for wheezing or shortness of breath.) 1 each 3   ALPRAZolam (XANAX) 0.5 MG tablet Take 1 tablet (0.5 mg total) by mouth 2 (two) times daily as needed for anxiety. (Patient taking differently: Take 0.5 mg by mouth daily as needed for anxiety.) 30 tablet 0   aspirin EC 81 MG tablet Take 1 tablet (81 mg total) by mouth  daily. Swallow whole. 90 tablet 3   carvedilol (COREG) 12.5 MG tablet Take 1 tablet (12.5 mg total) by mouth 2 (two) times daily. 180 tablet 3   Cholecalciferol (DIALYVITE VITAMIN D 5000) 125 MCG (5000 UT) capsule Take 5,000 Units by mouth in the morning.     clopidogrel (PLAVIX) 75 MG tablet Take 1 tablet (75 mg total) by mouth daily. 90 tablet 3   escitalopram (LEXAPRO) 20 MG tablet Take 1 tablet (20 mg total) by mouth daily. 30 tablet 3   Evolocumab (REPATHA SURECLICK [place]) Inject 140 mg into the skin every 14 (fourteen) days.     fluorometholone (FML) 0.1 % ophthalmic suspension Place 1 drop into both eyes daily.     fluticasone-salmeterol (ADVAIR) 100-50 MCG/ACT AEPB INHALE 1 PUFF BY MOUTH TWICE DAILY (Patient taking  differently: Inhale 1 puff into the lungs as needed (wheezing/SOB).) 60 each 10   gabapentin (NEURONTIN) 100 MG capsule Take 1 capsule (100 mg total) by mouth 3 (three) times daily. 30 capsule 0   linaclotide (LINZESS) 145 MCG CAPS capsule Take 145 mcg by mouth daily before breakfast.     NIFEdipine (PROCARDIA-XL/NIFEDICAL-XL) 30 MG 24 hr tablet Take 1 tablet (30 mg total) by mouth daily. 90 tablet 3   nitroGLYCERIN (NITROSTAT) 0.4 MG SL tablet DISSOLVE 1 TABLET UNDER THE TONGUE AS NEEDED FOR CHEST PAIN EVERY 5 MINUTES UP TO 3 TIMES. IF NO RELIEF CALL 911. (Patient taking differently: Place 0.4 mg under the tongue every 5 (five) minutes as needed for chest pain.) 25 tablet 1   NP THYROID 60 MG tablet Take 60 mg by mouth every morning. (Patient not taking: Reported on 11/03/2022)     prochlorperazine (COMPAZINE) 10 MG tablet Take 1 tablet (10 mg total) by mouth every 6 (six) hours as needed for nausea or vomiting. (Patient not taking: Reported on 11/03/2022) 90 tablet 1   RESTASIS 0.05 % ophthalmic emulsion Place 2 drops into both eyes as needed (dry eye).     traZODone (DESYREL) 50 MG tablet Take 50 mg by mouth at bedtime as needed for sleep.     No current facility-administered medications for this visit.     Assessment & Plan    No problem-specific Assessment & Plan notes found for this encounter.    Bethany Wang PharmD CPP [facility]  [address] [place], [place] [address] [phone]

## 2023-04-11 ENCOUNTER — Telehealth: Payer: Self-pay | Admitting: Internal Medicine

## 2023-04-11 NOTE — Telephone Encounter (Signed)
Patient identification verified by 2 forms. Marilynn Rail, RN    Called and spoke to patient  Patient states:   -has cough on going for 3 months   -at night feels like she is choking and wakes up coughing   -feels like she has to cough to catch her breath at night   -PCP concerned about heart issue/worsening HF at night   -Amlodipine was discontinued by Nephrology, started Nifedipine  Patient denies:   -chest pain/pressure  Patient scheduled for OV 1/6 at 8:20am  Reviewed ED warning signs/precautions  Patient agrees, no questions at this time

## 2023-04-11 NOTE — Telephone Encounter (Signed)
Patient stated she has an ongoing cough at night and her regular doctor told her it may be heart related.  Patient wants advice on next steps.

## 2023-04-18 ENCOUNTER — Telehealth: Payer: Self-pay | Admitting: *Deleted

## 2023-04-18 ENCOUNTER — Ambulatory Visit: Payer: 59 | Admitting: Internal Medicine

## 2023-04-18 ENCOUNTER — Encounter: Payer: Self-pay | Admitting: Internal Medicine

## 2023-04-18 NOTE — Telephone Encounter (Signed)
 LMOM advising of appointment cancellation due to inclement weather. Main office number provided to reschedule. Also attempted cell number, but no answer and unable to LM as mailbox is full.

## 2023-04-24 NOTE — Progress Notes (Signed)
 Office Visit    Patient Name: Bethany Wang Date of Encounter: 04/25/2023  Primary Care Provider:  Trudy Vaughn FALCON, MD Primary Cardiologist:  Vinie JAYSON Maxcy, MD  Chief Complaint    Hypertension  Significant Past Medical History   HTN On amlodipine , chlorthalidone , doxazosin , metoprolol  tart,   HLD 7/24 LDL 61 on evolocumab  CAD Multiple stents, should be on clopidogrel , asa  Vit D deficiency 3/22 at 22       Allergies  Allergen Reactions   Bee Venom Anaphylaxis and Other (See Comments)    throat swelled and had to the hospital - Yellow jacket sting   Lipitor [Atorvastatin ] Rash and Other (See Comments)    Liver damage & Upper Abdominal pain, Break out like a rash . Per last hosp visit. It exposed on inpatient stay.   Penicillins Anaphylaxis   Shellfish Allergy Swelling   Spironolactone  Other (See Comments)    Nausea, dehydration   Tomato Itching and Swelling   Valsartan  Itching   Zofran  [Ondansetron ] Itching   Crestor [Rosuvastatin] Rash and Other (See Comments)    Upper Abdominal pain   Ranolazine  Rash    History of Present Illness    Bethany Wang is a 58 y.o. female patient of Dr Maxcy, in the office today with concerns about her medications.   She states that there was some confusion at her pharmacy about which medications she should be taking.  She states her pharmacist told her she would need to speak to her MD's, so for the past several weeks, she has not taken any medication except for Repatha.  When I saw her in November we restarted the amlodipine  at 5 mg daily and carvedilol  at 6.25 mg bid.  She was later seen by nephrology who switched the amlodipine  to nifedipine  xl 30 mg daily.  She was seen by Rosaline Bane earlier this month and pressure remained elevated at 152/90.  Carvedilol  was increased to 12.5 mg.  Today she is in the office for follow up.  Her daughter is with her today and would like a clear explanation of her mother's heart disease.   We reviewed the information regarding CAD, angina and hypertension.    Blood Pressure Goal:  130/80  Current Medications:  nifedipine  xl 30 mg every day, carvedilol  12.5 mg bid  Previously tried:  valsartan  - itching, spironolactone  - nausea/dehydration   Social Hx:      Tobacco: former smoker, quit in 2013  Alcohol: no  Accessory Clinical Findings    Lab Results  Component Value Date   CREATININE 0.81 11/05/2022   BUN 15 11/05/2022   NA 138 11/05/2022   K 3.6 11/05/2022   CL 105 11/05/2022   CO2 22 11/05/2022   Lab Results  Component Value Date   ALT 22 11/05/2022   AST 32 11/05/2022   ALKPHOS 124 11/05/2022   BILITOT 1.0 11/05/2022   Lab Results  Component Value Date   HGBA1C 5.1 10/29/2019    Home Medications    Current Outpatient Medications  Medication Sig Dispense Refill   NIFEdipine  (PROCARDIA  XL/NIFEDICAL XL) 60 MG 24 hr tablet Take 1 tablet (60 mg total) by mouth daily. 30 tablet 3   albuterol  (VENTOLIN  HFA) 108 (90 Base) MCG/ACT inhaler Inhale 2 puffs into the lungs every 4 (four) hours as needed for wheezing or shortness of breath. (Patient taking differently: Inhale 2 puffs into the lungs as needed for wheezing or shortness of breath.) 1 each 3   ALPRAZolam  (  XANAX ) 0.5 MG tablet Take 1 tablet (0.5 mg total) by mouth 2 (two) times daily as needed for anxiety. (Patient taking differently: Take 0.5 mg by mouth daily as needed for anxiety.) 30 tablet 0   aspirin  EC 81 MG tablet Take 1 tablet (81 mg total) by mouth daily. Swallow whole. 90 tablet 3   carvedilol  (COREG ) 12.5 MG tablet Take 1 tablet (12.5 mg total) by mouth 2 (two) times daily. 180 tablet 3   Cholecalciferol (DIALYVITE VITAMIN D  5000) 125 MCG (5000 UT) capsule Take 5,000 Units by mouth in the morning.     clopidogrel  (PLAVIX ) 75 MG tablet Take 1 tablet (75 mg total) by mouth daily. 90 tablet 3   escitalopram  (LEXAPRO ) 20 MG tablet Take 1 tablet (20 mg total) by mouth daily. 30 tablet 3   Evolocumab  (REPATHA SURECLICK [place]) Inject 140 mg into the skin every 14 (fourteen) days.     fluorometholone (FML) 0.1 % ophthalmic suspension Place 1 drop into both eyes daily.     fluticasone -salmeterol (ADVAIR) 100-50 MCG/ACT AEPB INHALE 1 PUFF BY MOUTH TWICE DAILY (Patient taking differently: Inhale 1 puff into the lungs as needed (wheezing/SOB).) 60 each 10   gabapentin  (NEURONTIN ) 100 MG capsule Take 1 capsule (100 mg total) by mouth 3 (three) times daily. 30 capsule 0   linaclotide  (LINZESS ) 145 MCG CAPS capsule Take 145 mcg by mouth daily before breakfast.     nitroGLYCERIN  (NITROSTAT ) 0.4 MG SL tablet DISSOLVE 1 TABLET UNDER THE TONGUE AS NEEDED FOR CHEST PAIN EVERY 5 MINUTES UP TO 3 TIMES. IF NO RELIEF CALL 911. (Patient taking differently: Place 0.4 mg under the tongue every 5 (five) minutes as needed for chest pain.) 25 tablet 1   NP THYROID  60 MG tablet Take 60 mg by mouth every morning. (Patient not taking: Reported on 11/03/2022)     prochlorperazine  (COMPAZINE ) 10 MG tablet Take 1 tablet (10 mg total) by mouth every 6 (six) hours as needed for nausea or vomiting. (Patient not taking: Reported on 11/03/2022) 90 tablet 1   RESTASIS 0.05 % ophthalmic emulsion Place 2 drops into both eyes as needed (dry eye).     traZODone (DESYREL) 50 MG tablet Take 50 mg by mouth at bedtime as needed for sleep.     No current facility-administered medications for this visit.     Assessment & Plan    Resistant hypertension Assessment: BP is uncontrolled in office BP 183/84 mmHg;  above the goal (<130/80). Patient has high levels of frustration with health care system Tolerates nifedipine  and carvedilol  well, without any side effects Denies palpitation, headaches,or swelling Having occasional chest pain Complains of coughing, shortness of breath at night Reiterated the importance of regular exercise and low salt diet   Plan:  Start taking increase nifedipine  xl to 60 mg once daily Continue taking carvedilol   12.5 mg bid Encouraged purchase of home BP cuff and scale, but aware that finances are tight Patient to follow up with Dr. Mona  (missed Jan 6 appt 2/2 weather)  Labs ordered today:  BNP, BMET    Allean Mink PharmD CPP [facility]  [address] [place], [place] [address] [phone]

## 2023-04-25 ENCOUNTER — Ambulatory Visit (HOSPITAL_BASED_OUTPATIENT_CLINIC_OR_DEPARTMENT_OTHER): Payer: 59 | Admitting: Pharmacist Clinician (PhC)/ Clinical Pharmacy Specialist

## 2023-04-25 ENCOUNTER — Encounter (HOSPITAL_BASED_OUTPATIENT_CLINIC_OR_DEPARTMENT_OTHER): Payer: Self-pay | Admitting: Pharmacist Clinician (PhC)/ Clinical Pharmacy Specialist

## 2023-04-25 VITALS — BP 183/84 | HR 64 | Ht 64.0 in | Wt 154.8 lb

## 2023-04-25 DIAGNOSIS — I1A0 Resistant hypertension: Secondary | ICD-10-CM | POA: Diagnosis not present

## 2023-04-25 DIAGNOSIS — R0602 Shortness of breath: Secondary | ICD-10-CM | POA: Diagnosis not present

## 2023-04-25 MED ORDER — NIFEDIPINE ER OSMOTIC RELEASE 60 MG PO TB24: 60.0000 mg | ORAL_TABLET | Freq: Every day | ORAL | 3 refills | Status: AC

## 2023-04-25 NOTE — Patient Instructions (Signed)
 Follow up appointment: with Dr. Mona - I will reach out to his nurse   Go to the lab in today to check blood work for potential heart failure changes  Take your BP meds as follows:  Increase nifedipine  to 60 mg once daily  Check your blood pressure at home daily (if able) and keep record of the readings.  Your blood pressure goal is <130/80  To check your pressure at home you will need to:  1. Sit up in a chair, with feet flat on the floor and back supported. Do not cross your ankles or legs. 2. Rest your left arm so that the cuff is about heart level. If the cuff goes on your upper arm,  then just relax the arm on the table, arm of the chair or your lap. If you have a wrist cuff, we  suggest relaxing your wrist against your chest (think of it as Pledging the Flag with the  wrong arm).  3. Place the cuff snugly around your arm, about 1 inch above the crook of your elbow. The  cords should be inside the groove of your elbow.  4. Sit quietly, with the cuff in place, for about 5 minutes. After that 5 minutes press the power  button to start a reading. 5. Do not talk or move while the reading is taking place.  6. Record your readings on a sheet of paper. Although most cuffs have a memory, it is often  easier to see a pattern developing when the numbers are all in front of you.  7. You can repeat the reading after 1-3 minutes if it is recommended  Make sure your bladder is empty and you have not had caffeine or tobacco within the last 30 min  Always bring your blood pressure log with you to your appointments. If you have not brought your monitor in to be double checked for accuracy, please bring it to your next appointment.  You can find a list of quality blood pressure cuffs at [email or web address].no  Important lifestyle changes to control high blood pressure  Intervention  Effect on the BP  Lose extra pounds and watch your waistline Weight loss is one of the most effective lifestyle  changes for controlling blood pressure. If you're overweight or obese, losing even a small amount of weight can help reduce blood pressure. Blood pressure might go down by about 1 millimeter of mercury (mm Hg) with each kilogram (about 2.2 pounds) of weight lost.  Exercise regularly As a general goal, aim for at least 30 minutes of moderate physical activity every day. Regular physical activity can lower high blood pressure by about 5 to 8 mm Hg.  Eat a healthy diet Eating a diet rich in whole grains, fruits, vegetables, and low-fat dairy products and low in saturated fat and cholesterol. A healthy diet can lower high blood pressure by up to 11 mm Hg.  Reduce salt (sodium) in your diet Even a small reduction of sodium in the diet can improve heart health and reduce high blood pressure by about 5 to 6 mm Hg.  Limit alcohol One drink equals 12 ounces of beer, 5 ounces of wine, or 1.5 ounces of 80-proof liquor.  Limiting alcohol to less than one drink a day for women or two drinks a day for men can help lower blood pressure by about 4 mm Hg.   If you have any questions or concerns please use My Chart to send questions or call the  office at [phone]

## 2023-04-25 NOTE — Assessment & Plan Note (Signed)
 Assessment: BP is uncontrolled in office BP 183/84 mmHg;  above the goal (<130/80). Patient has high levels of frustration with health care system Tolerates nifedipine  and carvedilol  well, without any side effects Denies palpitation, headaches,or swelling Having occasional chest pain Complains of coughing, shortness of breath at night Reiterated the importance of regular exercise and low salt diet   Plan:  Start taking increase nifedipine  xl to 60 mg once daily Continue taking carvedilol  12.5 mg bid Encouraged purchase of home BP cuff and scale, but aware that finances are tight Patient to follow up with Dr. Mona  (missed Jan 6 appt 2/2 weather)  Labs ordered today:  BNP, BMET

## 2023-04-26 DIAGNOSIS — R030 Elevated blood-pressure reading, without diagnosis of hypertension: Secondary | ICD-10-CM | POA: Diagnosis not present

## 2023-04-26 DIAGNOSIS — R053 Chronic cough: Secondary | ICD-10-CM | POA: Diagnosis not present

## 2023-04-26 DIAGNOSIS — I771 Stricture of artery: Secondary | ICD-10-CM | POA: Diagnosis not present

## 2023-04-26 DIAGNOSIS — Z6824 Body mass index (BMI) 24.0-24.9, adult: Secondary | ICD-10-CM | POA: Diagnosis not present

## 2023-04-26 DIAGNOSIS — R059 Cough, unspecified: Secondary | ICD-10-CM | POA: Diagnosis not present

## 2023-04-26 DIAGNOSIS — G4700 Insomnia, unspecified: Secondary | ICD-10-CM | POA: Diagnosis not present

## 2023-04-26 LAB — BASIC METABOLIC PANEL
BUN/Creatinine Ratio: 9 (ref 9–23)
BUN: 7 mg/dL (ref 6–24)
CO2: 25 mmol/L (ref 20–29)
Calcium: 9.8 mg/dL (ref 8.7–10.2)
Chloride: 106 mmol/L (ref 96–106)
Creatinine, Ser: 0.82 mg/dL (ref 0.57–1.00)
Glucose: 91 mg/dL (ref 70–99)
Potassium: 4.2 mmol/L (ref 3.5–5.2)
Sodium: 145 mmol/L — ABNORMAL HIGH (ref 134–144)
eGFR: 83 mL/min/{1.73_m2} (ref >59)

## 2023-04-26 LAB — BRAIN NATRIURETIC PEPTIDE: BNP: 78.5 pg/mL (ref 0.0–100.0)

## 2023-04-27 ENCOUNTER — Telehealth (HOSPITAL_BASED_OUTPATIENT_CLINIC_OR_DEPARTMENT_OTHER): Payer: Self-pay

## 2023-04-27 NOTE — Telephone Encounter (Signed)
 Called and unable to leave voice message. Mailbox is full. Will re-attempt at a later time.  ----- Message from Clearnce Curia sent at 04/26/2023  5:13 PM EST ----- Normal BNP indicating no volume overload. Good result! Normal kidney function and potassium. Sodium mildly elevated, this is not of concern. Likely very mildly dehydrated when labs were drawn, be sure to stay well hydrated. Her current blood pressure medications would not affect sodium levels. . Continue with medication changes as discussed in PharmD visit 04/25/23.

## 2023-04-27 NOTE — Telephone Encounter (Signed)
-----   Message from Alver Sorrow sent at 04/26/2023  5:13 PM EST ----- Normal BNP indicating no volume overload. Good result! Normal kidney function and potassium. Sodium mildly elevated, this is not of concern. Likely very mildly dehydrated when labs were drawn, be sure to stay well hydrated. Her current blood pressure medications would not affect sodium levels. . Continue with medication changes as discussed in PharmD visit 04/25/23.

## 2023-04-29 ENCOUNTER — Telehealth: Payer: Self-pay | Admitting: Internal Medicine

## 2023-04-29 NOTE — Telephone Encounter (Signed)
Attempted to contact patient [name].   Also called on 04/27/23.

## 2023-04-29 NOTE — Telephone Encounter (Signed)
LVM to r/s her 1/6 appt with Dr. Rennis Golden or APP.

## 2023-04-29 NOTE — Telephone Encounter (Signed)
-----   Message from Nurse Eileen Stanford E sent at 04/26/2023  1:34 PM EST ----- Regarding: RE: reschedule Oh OK. Thank you. I was off that day so wasn't really aware of his schedule.   Bronson Ing -- do you mind contacting her for a r/s with Hilty or APP? ----- Message ----- From: Rosalee Kaufman, RPH-CPP Sent: 04/26/2023   1:05 PM EST To: Lindell Spar, RN Subject: RE: reschedule                                 She was on Hilty's schedule for the 6th I believe.   She thinks she's got HF issues (SOB, coughing, mostly at night).  I sent her for BNP yesterday, but doesn't look like she went upstairs (at [facility]) to get the lab drawn.  So I wouldn't say it's urgent.    Belenda Cruise ----- Message ----- From: Lindell Spar, RN Sent: 04/26/2023   7:49 AM EST To: Rosalee Kaufman, RPH-CPP Subject: RE: reschedule                                 Claris Che! Who does she need to be scheduled with? ----- Message ----- From: Rosalee Kaufman, RPH-CPP Sent: 04/25/2023   4:01 PM EST To: Lindell Spar, RN Subject: reschedule                                     She got cancelled because of weather, can you check to see if she can get scheduled back

## 2023-05-02 ENCOUNTER — Telehealth (HOSPITAL_BASED_OUTPATIENT_CLINIC_OR_DEPARTMENT_OTHER): Payer: Self-pay

## 2023-05-02 NOTE — Telephone Encounter (Signed)
Called and spoke to pt; discussed lab results and NP's recommendations. Pt verbalized understanding.  Pt frustrated about needing f/u appt with cardiologist ASAP. Message sent to scheduling team to f/u and schedule pt.    ----- Message from Alver Sorrow sent at 04/26/2023  5:13 PM EST ----- Normal BNP indicating no volume overload. Good result! Normal kidney function and potassium. Sodium mildly elevated, this is not of concern. Likely very mildly dehydrated when labs were drawn, be sure to stay well hydrated. Her current blood pressure medications would not affect sodium levels. . Continue with medication changes as discussed in PharmD visit 04/25/23.

## 2023-05-02 NOTE — Telephone Encounter (Signed)
-----   Message from Alver Sorrow sent at 04/26/2023  5:13 PM EST ----- Normal BNP indicating no volume overload. Good result! Normal kidney function and potassium. Sodium mildly elevated, this is not of concern. Likely very mildly dehydrated when labs were drawn, be sure to stay well hydrated. Her current blood pressure medications would not affect sodium levels. . Continue with medication changes as discussed in PharmD visit 04/25/23.

## 2023-05-04 NOTE — Progress Notes (Deleted)
Cardiology Office Note    Date:  05/04/2023  ID:  Bethany Wang, Bethany Wang 24-Dec-1965, MRN [id number] PCP:  Donetta Potts, MD  Cardiologist:  Chrystie Nose, MD  Electrophysiologist:  None   Chief Complaint: ***  History of Present Illness: .    Bethany Wang is a 58 y.o. female with visit-pertinent history of CAD s/p DES to RCA in 2013, DES to LAD in 2014, cath in 2018 and 7/2 and 01/2022 with patent LAD stent with jailed D2 by LAD stent and CTO of the RCA with left-to-right collaterals), hypertension, diabetes mellitus.  In 10/2018 patient underwent LHC with occlusion of RCA with previously placed coronary stent and left-to-right collaterals percent ISR of proximal LAD and 90% diagonal ostial narrowing also with 60 to 70% obtuse marginal narrowing of 50% distal circumflex disease.  It was recommended she have medical therapy.  Repeat catheterization in 10/29/2020 and 03/31/2021 were stable.  Imdur was adjusted to 60 mg twice daily and renal vein was trialed but discontinued after she developed a rash.  Office visit in 10/2021 her amlodipine, hydrochlorothiazide and thyroid medications have been held by her PCP for unclear reasons.  In office her blood pressure was 203/103 initially and 190/90 on recheck.  She reported having an argument prior to her office visit and declined medication changes.  Her HCTZ and amlodipine were resumed.  She reported palpitations and wore a cardiac monitor showing predominantly normal sinus rhythm and infrequent PVCs/PACs.  Carotid Dopplers in 10/2021 showed no significant stenosis.  In 01/2022 she developed worsening angina and underwent cardiac catheterization on 01/22/2022 revealing stable multivessel coronary disease with CTO of proximal RCA collateralized from LCA, mild to moderate ISR of the LAD with previous negative FloWire, moderately severe OM stenosis and small OM branch with negative FloWire previously.  It was felt that her symptoms were driven by supply  and demand mismatch and she was recommended for further aggressive antihypertensive treatment.  She was referred to the hypertension clinic.  She was first seen by Gillian Shields, NP and her blood pressure was 148/82 she was started on carvedilol 12.5 mg twice daily and metoprolol was discontinued.  She continued on amlodipine 10 mg daily, HCTZ 25 mg daily and Imdur 60 mg twice daily.  Cortisol, catecholamines, metanephrines and renin aldosterone were normal.  Renal artery Dopplers in 02/2022 revealed mild to moderate right renal artery stenosis without significant stenosis of the left.  She reported increased morning and was referred for a sleep study which was completed in 01/2022.  She had known intolerance to atorvastatin and rosuvastatin she was initiated on Repatha.  In 03/2022 she was evaluated by Pharm.D. and her hydrochlorothiazide was stopped she was started on 25 mg chlorthalidone daily.  On 04/25/2022 she presented to ED for chest pain.  Her blood pressure was elevated at 185/88.  Troponins were negative x 2.  Monitor a good EKG were negative for ischemia or arrhythmia.  She was given albuterol with improvement in symptoms, felt to be possible viral syndrome or reactive airway disease.  In 05/2022 her blood pressure was averaging in the 130s to 140s.  Her chlorthalidone was switched to spironolactone and potassium supplements discontinued.  Her carvedilol was also increased to 25 mg twice daily.  On follow-up with Gillian Shields in 06/2022 she reported stopping spironolactone due to nausea and dehydration.  She was started on doxazosin, she declined restarting of chlorthalidone.  Sleep study in 06/2022 showed no significant sleep apnea overall but  did have some mild sleep apnea during REM sleep.  She did not meet criteria for CPAP therapy.  At her office visit with Dr. Duke Salvia in 07/2022 she reported multiple episodes of passing out, occurring in various settings.  The episodes as sudden, with no  preceding symptoms like sweating which she used to experience before her previous surgery for brain tumor.  She noted that during the episodes she appeared to stop breathing and would have a very low pulse.  Noted that she had a history of seizures and brain tumor that she had undergone surgery for.  Her neurologist had previously indicated that she still experience seizures.  It was recommended that she wear a 14-day live ZIO monitor for arrhythmia detection, not drive until her episodes of syncope were resolved.  She was also referred to a neurologist for further evaluation to rule out seizure recurrence.  Cardiac monitor in 09/2022 indicated predominant rhythm of sinus rhythm, average heart rate 71 bpm, max heart rate 120 bpm, minimum heart rate 44 bpm.  She had 7 episodes of SVT lasting up to 12 beats.  Echocardiogram on 09/07/2022 indicated LVEF of 60 to 65%, no RWMA, mild LVH, diastolic parameters were indeterminate.  Aortic valve regurgitation was noted to be moderate to severe.  There was mild dilation of the acing aorta measuring 36 mm.  In office visit with Dr. Rennis Golden on 10/29/2022 patient reported experiencing episodes of shortness of breath and chest pressure with exertion.  She reported progressive fatigue and decreased exercise tolerance.  She reported having a syncopal episode as well with no prodromal symptoms.  That her blood pressure was 60/30 and eventually resolved.  It was recommended that she undergo right and left heart catheterization.  Her cardiac catheterization showed stable multivessel CAD with no change since per prior cath, she had a patent mid LAD stent with moderate restenosis that was unchanged, moderate disease in the small diagonal branch and small obtuse marginal branch that are both too small for PCI.  She had chronic total occlusion of the proximal RCA, mid and distal RCA and distal branches fill from left to right collaterals.  She was last seen in clinic on 03/21/2023 by  Herma Carson, PA.  Reported that her PCP had stopped amlodipine and ordered nifedipine although she had not yet started the medication.  She reported that she had overall felt pretty good.  She questioned if Repatha was causing cold-like symptoms with cough and phlegm and night sweats.  On 04/25/2023 she was seen by Phillips Hay, RPH.   HTN: Blood pressure today Continue  Shortness of breath: Echocardiogram on 09/07/2022 indicated LVEF of 60 to 65%, no RWMA, mild LVH, diastolic parameters were indeterminate.  Aortic valve regurgitation was noted to be moderate to severe.  At visit with pharmacist on 04/25/2023 she reported increased shortness of breath and coughing at night, her BNP was normal at 78.5. Today she reports  CAD: Cardiac catheterization in 10/2022 showed stable multivessel CAD with no change since per prior cath, she had a patent mid LAD stent with moderate restenosis that was unchanged, moderate disease in the small diagonal branch and small obtuse marginal branch that are both too small for PCI.  She had chronic total occlusion of the proximal RCA, mid and distal RCA and distal branches fill from left to right collaterals. Today she reports    Labwork independently reviewed:   ROS: .    Please see the history of present illness. Otherwise, review of systems is  positive for ***.  All other systems are reviewed and otherwise negative.  Studies Reviewed: [name]    EKG:  EKG is ordered today, personally reviewed, demonstrating ***  CV Studies: Cardiac studies reviewed are outlined and summarized above. Otherwise please see EMR for full report.   Current Reported Medications:.    No outpatient medications have been marked as taking for the 05/05/23 encounter (Appointment) with Rip Harbour, NP.    Physical Exam:    VS:  There were no vitals taken for this visit.   Wt Readings from Last 3 Encounters:  04/25/23 154 lb 12.8 oz (70.2 kg)  03/21/23 151 lb 3.2 oz (68.6 kg)   02/17/23 149 lb 11.2 oz (67.9 kg)    GEN: Well nourished, well developed in no acute distress NECK: No JVD; No carotid bruits CARDIAC: ***RRR, no murmurs, rubs, gallops RESPIRATORY:  Clear to auscultation without rales, wheezing or rhonchi  ABDOMEN: Soft, non-tender, non-distended EXTREMITIES:  No edema; No acute deformity   Asessement and Plan:.     ***     Disposition: F/u with ***  Signed, Rip Harbour, NP

## 2023-05-05 ENCOUNTER — Ambulatory Visit: Payer: 59 | Attending: Cardiology | Admitting: Cardiology

## 2023-05-05 DIAGNOSIS — I351 Nonrheumatic aortic (valve) insufficiency: Secondary | ICD-10-CM

## 2023-05-05 DIAGNOSIS — I1A0 Resistant hypertension: Secondary | ICD-10-CM

## 2023-05-05 DIAGNOSIS — I25119 Atherosclerotic heart disease of native coronary artery with unspecified angina pectoris: Secondary | ICD-10-CM

## 2023-05-05 DIAGNOSIS — R002 Palpitations: Secondary | ICD-10-CM

## 2023-05-05 DIAGNOSIS — E782 Mixed hyperlipidemia: Secondary | ICD-10-CM

## 2023-05-05 DIAGNOSIS — R0602 Shortness of breath: Secondary | ICD-10-CM

## 2023-05-09 NOTE — Progress Notes (Signed)
Cardiology Office Note    Date:  05/13/2023  ID:  Bethany Wang, Bethany Wang [birth year], MRN [id number] PCP:  Donetta Potts, MD  Cardiologist:  Chrystie Nose, MD  Electrophysiologist:  None   Chief Complaint: Cough, shortness of breath   History of Present Illness: .    Bethany Wang is a 58 y.o. female with visit-pertinent history of CAD s/p DES to RCA in 2013, DES to LAD in 2014, cath in 2018 and 7/2 and 01/2022 with patent LAD stent with jailed D2 by LAD stent and CTO of the RCA with left-to-right collaterals), hypertension, diabetes mellitus.    In 10/2018 patient underwent LHC with occlusion of RCA with previously placed coronary stent and left-to-right collaterals percent ISR of proximal LAD and 90% diagonal ostial narrowing also with 60 to 70% obtuse marginal narrowing of 50% distal circumflex disease.  It was recommended she have medical therapy.  Repeat catheterization in 10/29/2020 and 03/31/2021 were stable.  Imdur was adjusted to 60 mg twice daily and renal vein was trialed but discontinued after she developed a rash.  Office visit in 10/2021 her amlodipine, hydrochlorothiazide and thyroid medications have been held by her PCP for unclear reasons.  In office her blood pressure was 203/103 initially and 190/90 on recheck.  She reported having an argument prior to her office visit and declined medication changes.  Her HCTZ and amlodipine were resumed.  She reported palpitations and wore a cardiac monitor showing predominantly normal sinus rhythm and infrequent PVCs/PACs.  Carotid Dopplers in 10/2021 showed no significant stenosis.  In 01/2022 she developed worsening angina and underwent cardiac catheterization on 01/22/2022 revealing stable multivessel coronary disease with CTO of proximal RCA collateralized from LCA, mild to moderate ISR of the LAD with previous negative FloWire, moderately severe OM stenosis and small OM branch with negative FloWire previously.  It was felt that her  symptoms were driven by supply and demand mismatch and she was recommended for further aggressive antihypertensive treatment.  She was referred to the hypertension clinic.  She was first seen by Gillian Shields, NP and her blood pressure was 148/82 she was started on carvedilol 12.5 mg twice daily and metoprolol was discontinued.  She continued on amlodipine 10 mg daily, HCTZ 25 mg daily and Imdur 60 mg twice daily.  Cortisol, catecholamines, metanephrines and renin aldosterone were normal.  Renal artery Dopplers in 02/2022 revealed mild to moderate right renal artery stenosis without significant stenosis of the left.  She reported increased morning and was referred for a sleep study which was completed in 01/2022.  She had known intolerance to atorvastatin and rosuvastatin she was initiated on Repatha.  In 03/2022 she was evaluated by Pharm.D. and her hydrochlorothiazide was stopped she was started on 25 mg chlorthalidone daily.  On 04/25/2022 she presented to ED for chest pain.  Her blood pressure was elevated at 185/88.  Troponins were negative x 2.  EKG was negative for ischemia or arrhythmia.  She was given albuterol with improvement in symptoms, felt to be possible viral syndrome or reactive airway disease.  In 05/2022 her blood pressure was averaging in the 130s to 140s.  Her chlorthalidone was switched to spironolactone and potassium supplements discontinued.  Her carvedilol was also increased to 25 mg twice daily.  On follow-up with Gillian Shields in 06/2022 she reported stopping spironolactone due to nausea and dehydration.  She was started on doxazosin, she declined restarting of chlorthalidone.  Sleep study in 06/2022 showed no significant sleep  apnea overall but did have some mild sleep apnea during REM sleep.  She did not meet criteria for CPAP therapy.  At her office visit with Dr. Duke Salvia in 07/2022 she reported multiple episodes of passing out, occurring in various settings.  The episodes as  sudden, with no preceding symptoms like sweating which she used to experience before her previous surgery for brain tumor.  She noted that during the episodes she appeared to stop breathing and would have a very low pulse.  Noted that she had a history of seizures and brain tumor that she had undergone surgery for.  Her neurologist had previously indicated that she still experience seizures.  It was recommended that she wear a 14-day live ZIO monitor for arrhythmia detection, not drive until her episodes of syncope were resolved.  She was also referred to a neurologist for further evaluation to rule out seizure recurrence.  Cardiac monitor in 09/2022 indicated predominant rhythm of sinus rhythm, average heart rate 71 bpm, max heart rate 120 bpm, minimum heart rate 44 bpm.  She had 7 episodes of SVT lasting up to 12 beats.  Echocardiogram on 09/07/2022 indicated LVEF of 60 to 65%, no RWMA, mild LVH, diastolic parameters were indeterminate.  Aortic valve regurgitation was noted to be moderate to severe.  There was mild dilation of the acing aorta measuring 36 mm.  In office visit with Dr. Rennis Golden on 10/29/2022 patient reported experiencing episodes of shortness of breath and chest pressure with exertion.  She reported progressive fatigue and decreased exercise tolerance.  She reported having a syncopal episode as well with no prodromal symptoms.  That her blood pressure was 60/30 and eventually resolved.  It was recommended that she undergo right and left heart catheterization.  Her cardiac catheterization showed stable multivessel CAD with no change since per prior cath, she had a patent mid LAD stent with moderate restenosis that was unchanged, moderate disease in the small diagonal branch and small obtuse marginal branch that are both too small for PCI.  She had chronic total occlusion of the proximal RCA, mid and distal RCA and distal branches fill from left to right collaterals.    She was last seen in clinic on  03/21/2023 by Herma Carson, PA.  Reported that her PCP had stopped amlodipine and ordered nifedipine although she had not yet started the medication.  She reported that she had overall felt pretty good.  She questioned if Repatha was causing cold-like symptoms with cough and phlegm and night sweats.  On 04/25/2023 she was seen by Phillips Hay, RPH.  Patient's blood pressure was uncontrolled at 183/84, patient noted high levels of frustration with health care.  It was noted that she was tolerating nifedipine and carvedilol well without any side effects.  Her nifedipine was increased to 60 mg once daily, she was continued on carvedilol 12.5 mg twice daily.  Patient noted ongoing cough, BNP was normal at 78.5.  On 04/26/2023 she was seen her PCP.  She had a chest x-ray that showed no active cardiopulmonary disease, both lungs were clear.  Today she presents for follow-up for hypertension, with concerns for shortness of breath and cough.  Patient reports that her primary concern is an ongoing cough that she has had for months.  She notes that it is worse at night when she tries to lay down, she has production of green mucus with some blood-tinged.  She reports that she has been seen by her PCP numerous times and has been on  cough medication as well as antibiotics without improvement.  She notes occasional chest discomfort as well as lower extremity edema that is worse at the end of the day and improves overnight.  She reports that she is being followed by Dr. Wolfgang Phoenix with nephrology as well.  She is planning to see him again in March.  Labwork independently reviewed: 04/25/2023: BNP 78.5, sodium 145, potassium 4.2, creatinine 0.82 ROS: .   Today she denies fatigue, palpitations, melena, hematuria, hemoptysis, diaphoresis, weakness, presyncope, syncope, orthopnea, and PND.  All other systems are reviewed and otherwise negative. Studies Reviewed: [name]    EKG:  EKG is ordered today, personally reviewed,  demonstrating  EKG Interpretation Date/Time:  Thursday May 12 2023 09:05:51 EST Ventricular Rate:  66 PR Interval:  156 QRS Duration:  98 QT Interval:  414 QTC Calculation: 434 R Axis:   6  Text Interpretation: Normal sinus rhythm Minimal voltage criteria for LVH, may be normal variant ( Sokolow-Lyon ) When compared with ECG of 29-Oct-2022 15:14, Criteria for Septal infarct are no longer Present Inverted T waves have replaced nonspecific T wave abnormality in Inferior leads noted on prior EKGs Nonspecific T wave abnormality no longer evident in Anterior leads Confirmed by Reather Littler [id number]) on 05/12/2023 1:06:05 PM   CV Studies:  Cardiac Studies & Procedures   CARDIAC CATHETERIZATION  CARDIAC CATHETERIZATION 11/03/2022  Narrative   Dist LAD lesion is 50% stenosed.   Mid LAD-2 lesion is 30% stenosed.   Prox LAD lesion is 30% stenosed.   Prox Cx to Dist Cx lesion is 30% stenosed.   Prox RCA to Mid RCA lesion is 100% stenosed.   Ost 2nd Diag lesion is 75% stenosed.   2nd Diag lesion is 70% stenosed.   1st Mrg lesion is 75% stenosed.   2nd Mrg lesion is 20% stenosed.   Mid LAD-1 lesion is 50% stenosed.  Stable multi-vessel CAD with no change since last cardiac cath. Patent mid LAD stent with moderate restenosis-unchanged. (Previous flow wire negative) Moderate disease in the small Diagonal branch and small obtuse marginal branch-both too small for PCI. Chronic total occlusion of the proximal RCA. The mid and distal RCA and distal branches fill from left to right collaterals. RA 4, RV 34/2/3, PA 34/13 mean 20, PCWP 6, LVEDP 18 mmHg., AO 177/80 Uncontrolled HTN  Recommendations: Continue medical management of CAD. Aggressive approach to HTN control.  Findings Coronary Findings Diagnostic  Dominance: Co-dominant  Left Main Vessel is large.  Left Anterior Descending Vessel is large. The appearance of the LAD is unchanged.  There is moderate 50% in-stent restenosis, with no  significant progression from the previous cath went there was a negative flow wire analysis. Prox LAD lesion is 30% stenosed. Mid LAD-1 lesion is 50% stenosed. The lesion was previously treated over 2 years ago. Previously placed stent displays restenosis. RFR = 0.91. Mid LAD-2 lesion is 30% stenosed. Dist LAD lesion is 50% stenosed.  First Diagonal Branch Vessel is small in size.  Second Diagonal [employer] 2nd Diag lesion is 75% stenosed. The lesion is eccentric. 2nd Diag lesion is 70% stenosed.  Ramus Intermedius Vessel is small.  Left Circumflex Vessel is large. The circumflex has mild diffuse plaquing without any high-grade stenoses besides the small OM branch. Prox Cx to Dist Cx lesion is 30% stenosed.  First Obtuse Marginal Branch Vessel is small in size. Unchanged from the previous cath study.  Small vessel.  Negative RFR at the time of the previous study. 1st  Mrg lesion is 75% stenosed. RFR = 0.92.  Second Obtuse Marginal Branch 2nd Mrg lesion is 20% stenosed.  Right Coronary Artery Vessel is moderate in size. The RCA is occluded proximally at the proximal stent edge, unchanged from the previous studies.  The distal RCA is collateralized from the left coronary artery. Prox RCA to Mid RCA lesion is 100% stenosed. The lesion was previously treated .  Right Posterior Descending Artery Collaterals RPDA filled by collaterals from Dist LAD.  Intervention  No interventions have been documented.   CARDIAC CATHETERIZATION  CARDIAC CATHETERIZATION 01/22/2022  Narrative Final conclusion: Stable multivessel coronary artery disease with chronic total occlusion of the proximal RCA collateralized from the left coronary artery, mild to moderate in-stent restenosis in the LAD (previous flow wire negative), moderately severe OM stenosis and a small OM branch (negative flow wire in past)  No change in the patient's coronary anatomy over multiple studies.  FloWire analysis has  been done demonstrating no flow obstructive disease on past studies.  Recommend continued medical therapy.  Likely needs escalation of antihypertensive therapy as I suspect hypertension (supply/demand mismatch) is driving her symptoms.  Findings Coronary Findings Diagnostic  Dominance: Co-dominant  Left Main Vessel is large.  Left Anterior Descending Vessel is large. The appearance of the LAD is unchanged.  There is moderate 50% in-stent restenosis, with no significant progression from the previous cath went there was a negative flow wire analysis. Prox LAD lesion is 30% stenosed. Mid LAD-1 lesion is 50% stenosed. The lesion was previously treated over 2 years ago. Previously placed stent displays restenosis. RFR = 0.91. Mid LAD-2 lesion is 30% stenosed. Dist LAD lesion is 50% stenosed.  First Diagonal Branch Vessel is small in size.  Second Diagonal [employer] 2nd Diag lesion is 75% stenosed. The lesion is eccentric. 2nd Diag lesion is 70% stenosed.  Ramus Intermedius Vessel is small.  Left Circumflex Vessel is large. The circumflex has mild diffuse plaquing without any high-grade stenoses besides the small OM branch. Prox Cx to Dist Cx lesion is 30% stenosed.  First Obtuse Marginal Branch Vessel is small in size. Unchanged from the previous cath study.  Small vessel.  Negative RFR at the time of the previous study. 1st Mrg lesion is 75% stenosed. RFR = 0.92.  Second Obtuse Marginal Branch 2nd Mrg lesion is 20% stenosed.  Right Coronary Artery Vessel is moderate in size. The RCA is occluded proximally at the proximal stent edge, unchanged from the previous studies.  The distal RCA is collateralized from the left coronary artery. Prox RCA to Mid RCA lesion is 100% stenosed. The lesion was previously treated .  Right Posterior Descending Artery Collaterals RPDA filled by collaterals from Dist LAD.  Intervention  No interventions have been documented.   STRESS  TESTS  MYOCARDIAL PERFUSION IMAGING 03/15/2014  ECHOCARDIOGRAM  ECHOCARDIOGRAM COMPLETE 09/07/2022  Narrative ECHOCARDIOGRAM REPORT    Patient Name:   Bethany Wang Date of Exam: 09/07/2022 Medical Rec #:  [id number]       Height:       64.0 in Accession #:    [id number]      Weight:       142.0 lb Date of Birth:  23-May-1965      BSA:          1.691 m Patient Age:    56 years        BP:           173/90 mmHg Patient Gender: F  HR:           51 bpm. [facility]am [facility]  Procedure: 2D Echo, Cardiac Doppler and Color Doppler  Indications:    Syncope  History:        Patient has prior history of Echocardiogram examinations, most recent 10/23/2020. Previous Myocardial Infarction and CAD, Stroke; Risk Factors:Dyslipidemia, Hypertension and Diabetes. CA, hx of chemotherapy/radiation.  Sonographer:    Milda Smart Referring Phys: [id number] TIFFANY [place]   Sonographer Comments: Image acquisition challenging due to respiratory motion. IMPRESSIONS   1. Left ventricular ejection fraction, by estimation, is 60 to 65%. The left ventricle has normal function. The left ventricle has no regional wall motion abnormalities. There is mild left ventricular hypertrophy. Left ventricular diastolic parameters are indeterminate. 2. Right ventricular systolic function is normal. The right ventricular size is normal. 3. The mitral valve is normal in structure. No evidence of mitral valve regurgitation. No evidence of mitral stenosis. 4. AI vena contracta is 0.5 cm. There is holodiastolic flow reversal in the distal aortic arch suggesting severe AI. AI vena contracta and pressure half time more suggestive of moderate AI but toward severe end of the spectrum. LVEF 60-65%, LVIDs 3.3. [name] The aortic valve is tricuspid. Aortic valve regurgitation is moderate to severe. No aortic stenosis is present. 5. Aortic dilatation noted. There is mild dilatation of the ascending aorta, measuring  36 mm. 6. The inferior vena cava is dilated in size with <50% respiratory variability, suggesting right atrial pressure of 15 mmHg.  FINDINGS Left Ventricle: Left ventricular ejection fraction, by estimation, is 60 to 65%. The left ventricle has normal function. The left ventricle has no regional wall motion abnormalities. The left ventricular internal cavity size was normal in size. There is mild left ventricular hypertrophy. Left ventricular diastolic parameters are indeterminate.  Right Ventricle: The right ventricular size is normal. Right vetricular wall thickness was not well visualized. Right ventricular systolic function is normal.  Left Atrium: Left atrial size was normal in size.  Right Atrium: Right atrial size was normal in size.  Pericardium: There is no evidence of pericardial effusion.  Mitral Valve: The mitral valve is normal in structure. No evidence of mitral valve regurgitation. No evidence of mitral valve stenosis.  Tricuspid Valve: The tricuspid valve is normal in structure. Tricuspid valve regurgitation is not demonstrated. No evidence of tricuspid stenosis.  Aortic Valve: AI vena contracta is 0.5 cm. There is holodiastolic flow reversal in the distal aortic arch suggesting severe AI. AI vena contracta and pressure half time more suggestive of moderate AI but toward severe end of the spectrum. LVEF 60-65%, LVIDs 3.3. The aortic valve is tricuspid. Aortic valve regurgitation is moderate to severe. Aortic regurgitation PHT measures 874 msec. No aortic stenosis is present. Aortic valve mean gradient measures 5.1 mmHg. Aortic valve peak gradient measures 12.3 mmHg. Aortic valve area, by VTI measures 2.98 cm.  Pulmonic Valve: The pulmonic valve was not well visualized. Pulmonic valve regurgitation is not visualized. No evidence of pulmonic stenosis.  Aorta: Aortic dilatation noted and the aortic root is normal in size and structure. There is mild dilatation of the ascending  aorta, measuring 36 mm.  Venous: The inferior vena cava is dilated in size with less than 50% respiratory variability, suggesting right atrial pressure of 15 mmHg.  IAS/Shunts: No atrial level shunt detected by color flow Doppler.   LEFT VENTRICLE PLAX 2D LVIDd:         4.70 cm  Diastology LVIDs:         3.30 cm      LV e' medial:    6.74 cm/s LV PW:         1.10 cm      LV E/e' medial:  10.1 LV IVS:        1.10 cm      LV e' lateral:   6.64 cm/s LVOT diam:     2.10 cm      LV E/e' lateral: 10.2 LV SV:         110 LV SV Index:   65 LVOT Area:     3.46 cm  LV Volumes (MOD) LV vol d, MOD A2C: 105.0 ml LV vol d, MOD A4C: 94.9 ml LV vol s, MOD A2C: 30.7 ml LV vol s, MOD A4C: 33.0 ml LV SV MOD A2C:     74.3 ml LV SV MOD A4C:     94.9 ml LV SV MOD BP:      68.5 ml  RIGHT VENTRICLE RV S prime:     8.70 cm/s TAPSE (M-mode): 1.3 cm  LEFT ATRIUM             Index        RIGHT ATRIUM           Index LA diam:        2.60 cm 1.54 cm/m   RA Area:     13.80 cm LA Vol (A2C):   53.9 ml 31.87 ml/m  RA Volume:   39.10 ml  23.12 ml/m LA Vol (A4C):   28.0 ml 16.55 ml/m LA Biplane Vol: 40.7 ml 24.06 ml/m AORTIC VALVE AV Area (Vmax):    2.92 cm AV Area (Vmean):   2.92 cm AV Area (VTI):     2.98 cm AV Vmax:           175.50 cm/s AV Vmean:          104.045 cm/s AV VTI:            0.370 m AV Peak Grad:      12.3 mmHg AV Mean Grad:      5.1 mmHg LVOT Vmax:         148.00 cm/s LVOT Vmean:        87.700 cm/s LVOT VTI:          0.318 m LVOT/AV VTI ratio: 0.86 AI PHT:            874 msec  AORTA Ao Root diam: 3.50 cm Ao Asc diam:  3.60 cm  MITRAL VALVE               TRICUSPID VALVE MV Area (PHT): 3.58 cm    TR Peak grad:   7.5 mmHg MV Decel Time: 212 msec    TR Vmax:        137.00 cm/s MR Peak grad: 59.0 mmHg MR Vmax:      384.00 cm/s  SHUNTS MV E velocity: 67.90 cm/s  Systemic VTI:  0.32 m MV A velocity: 53.00 cm/s  Systemic Diam: 2.10 cm MV E/A ratio:  1.28  Dina Rich MD Electronically signed by Dina Rich MD Signature Date/Time: 09/07/2022/2:52:54 PM    Final   MONITORS  LONG TERM MONITOR-LIVE TELEMETRY (3-14 DAYS) 08/31/2022  Narrative 13 Day Zio Monitor  Quality: Fair.  Baseline artifact. Predominant rhythm: sinus rhythm Average heart rate: 71 bpm Max heart rate: 120 bpm Min heart rate: 44 bpm Pauses >2.5 seconds:  none  7 episodes of SVT lasting up to 12 beats Rare (<1%) PACs and PVCs Rare ventricular bigeminy  Tiffany C. Duke Salvia, MD, [facility] 10/01/2022 9:11 AM           Current Reported Medications:.    Current Meds  Medication Sig   albuterol (VENTOLIN HFA) 108 (90 Base) MCG/ACT inhaler Inhale 2 puffs into the lungs every 4 (four) hours as needed for wheezing or shortness of breath. (Patient taking differently: Inhale 2 puffs into the lungs as needed for wheezing or shortness of breath.)   ALPRAZolam (XANAX) 0.5 MG tablet Take 1 tablet (0.5 mg total) by mouth 2 (two) times daily as needed for anxiety. (Patient taking differently: Take 0.5 mg by mouth daily as needed for anxiety.)   aspirin EC 81 MG tablet Take 1 tablet (81 mg total) by mouth daily. Swallow whole.   carvedilol (COREG) 12.5 MG tablet Take 1 tablet (12.5 mg total) by mouth 2 (two) times daily.   Cholecalciferol (DIALYVITE VITAMIN D 5000) 125 MCG (5000 UT) capsule Take 5,000 Units by mouth in the morning.   clopidogrel (PLAVIX) 75 MG tablet Take 1 tablet (75 mg total) by mouth daily.   escitalopram (LEXAPRO) 20 MG tablet Take 1 tablet (20 mg total) by mouth daily.   Evolocumab (REPATHA SURECLICK [place]) Inject 140 mg into the skin every 14 (fourteen) days.   fluorometholone (FML) 0.1 % ophthalmic suspension Place 1 drop into both eyes daily.   fluticasone-salmeterol (ADVAIR) 100-50 MCG/ACT AEPB INHALE 1 PUFF BY MOUTH TWICE DAILY (Patient taking differently: Inhale 1 puff into the lungs as needed (wheezing/SOB).)   gabapentin (NEURONTIN) 100 MG capsule Take 1 capsule  (100 mg total) by mouth 3 (three) times daily.   isosorbide mononitrate (IMDUR) 30 MG 24 hr tablet Take 1 tablet (30 mg total) by mouth daily.   linaclotide (LINZESS) 145 MCG CAPS capsule Take 145 mcg by mouth daily before breakfast.   NIFEdipine (PROCARDIA XL/NIFEDICAL XL) 60 MG 24 hr tablet Take 1 tablet (60 mg total) by mouth daily.   nitroGLYCERIN (NITROSTAT) 0.4 MG SL tablet DISSOLVE 1 TABLET UNDER THE TONGUE AS NEEDED FOR CHEST PAIN EVERY 5 MINUTES UP TO 3 TIMES. IF NO RELIEF CALL 911. (Patient taking differently: Place 0.4 mg under the tongue every 5 (five) minutes as needed for chest pain.)   NP THYROID 60 MG tablet Take 60 mg by mouth every morning.   prochlorperazine (COMPAZINE) 10 MG tablet Take 1 tablet (10 mg total) by mouth every 6 (six) hours as needed for nausea or vomiting.   RESTASIS 0.05 % ophthalmic emulsion Place 2 drops into both eyes as needed (dry eye).   traZODone (DESYREL) 50 MG tablet Take 50 mg by mouth at bedtime as needed for sleep.   Physical Exam:    VS:  BP (!) 158/78   Pulse 66   Ht 5\' 4"  (1.626 m)   Wt 150 lb (68 kg)   SpO2 97%   BMI 25.75 kg/m    Wt Readings from Last 3 Encounters:  05/12/23 150 lb (68 kg)  04/25/23 154 lb 12.8 oz (70.2 kg)  03/21/23 151 lb 3.2 oz (68.6 kg)   GEN: Well nourished, well developed in no acute distress NECK: No JVD; No carotid bruits CARDIAC: RRR, 2/6 diastolic murmur left sternal border, no rubs or gallops RESPIRATORY:  Clear to auscultation without rales, wheezing or rhonchi  ABDOMEN: Soft, non-tender, non-distended EXTREMITIES:  No edema; No acute deformity  Asessement and Plan:.  HTN: Blood pressure today 170/70, on recheck was 158/78.  On chart review patient's Imdur was previously discontinued as patient was unsure what she was taking, removed from list in order to restart her on blood pressure medications in November. She is followed by HTN clinic. Will restart Imdur 30 mg daily. Continue nifedipine 60 mg daily  and carvedilol 12.5 mg twice daily.  Shortness of breath/Cough/aortic insufficiency: Echocardiogram on 09/07/2022 indicated LVEF of 60 to 65%, no RWMA, mild LVH, diastolic parameters were indeterminate.  Aortic valve regurgitation was noted to be moderate to severe.  At visit with pharmD on 04/25/2023 she reported increased shortness of breath and coughing at night, her BNP was normal at 78.5.  Today she continues to note increased cough and shortness of breath for the last 3 months.  She notes that her cough is worse at night when she lays down, she has produced green mucus that is blood-tinged.  Chest x-ray last week showed no active cardiopulmonary disease, both lungs were clear.  On exam she does not appear fluid overloaded, she has no lower extremity edema currently and her lung sounds are clear, she has no JVD and her weights are stable.  Discussed possibility that this could be related to acid reflux, patient adamantly denies any acid reflux, she notes that she does have history and has had no recurrence. She also notes that her current housing has a mold problem, is planning to move soon. Given history of AI will check echocardiogram.   CAD: Cardiac catheterization in 10/2022 showed stable multivessel CAD with no change since per prior cath, she had a patent mid LAD stent with moderate restenosis that was unchanged, moderate disease in the small diagonal branch and small obtuse marginal branch that are both too small for PCI.  She had chronic total occlusion of the proximal RCA, mid and distal RCA and distal branches fill from left to right collaterals. Today she reports occasional chest discomfort and increased shortness of breath, as noted above. Reviewed Ed precautions. Will restart Imdur 30 mg daily and check echocardiogram as noted above.   Hyperlipidemia: Last lipid profile on 11/01/2022 indicated total cholesterol 127, HDL 54, triglycerides 62 and LDL 61.  History of statin intolerance. On Repatha.     Disposition: F/u with HTN clinic in one month.   Signed, Rip Harbour, NP

## 2023-05-10 ENCOUNTER — Other Ambulatory Visit: Payer: Self-pay | Admitting: Internal Medicine

## 2023-05-10 DIAGNOSIS — Z6824 Body mass index (BMI) 24.0-24.9, adult: Secondary | ICD-10-CM | POA: Diagnosis not present

## 2023-05-10 DIAGNOSIS — R053 Chronic cough: Secondary | ICD-10-CM | POA: Diagnosis not present

## 2023-05-10 DIAGNOSIS — R030 Elevated blood-pressure reading, without diagnosis of hypertension: Secondary | ICD-10-CM | POA: Diagnosis not present

## 2023-05-10 DIAGNOSIS — G4700 Insomnia, unspecified: Secondary | ICD-10-CM | POA: Diagnosis not present

## 2023-05-10 DIAGNOSIS — Z1231 Encounter for screening mammogram for malignant neoplasm of breast: Secondary | ICD-10-CM

## 2023-05-12 ENCOUNTER — Encounter: Payer: Self-pay | Admitting: Cardiology

## 2023-05-12 ENCOUNTER — Ambulatory Visit: Payer: 59 | Attending: Cardiology | Admitting: Cardiology

## 2023-05-12 VITALS — BP 158/78 | HR 66 | Ht 64.0 in | Wt 150.0 lb

## 2023-05-12 DIAGNOSIS — I25119 Atherosclerotic heart disease of native coronary artery with unspecified angina pectoris: Secondary | ICD-10-CM | POA: Diagnosis not present

## 2023-05-12 DIAGNOSIS — I1A0 Resistant hypertension: Secondary | ICD-10-CM | POA: Diagnosis not present

## 2023-05-12 DIAGNOSIS — R059 Cough, unspecified: Secondary | ICD-10-CM

## 2023-05-12 DIAGNOSIS — R0602 Shortness of breath: Secondary | ICD-10-CM

## 2023-05-12 DIAGNOSIS — E782 Mixed hyperlipidemia: Secondary | ICD-10-CM

## 2023-05-12 DIAGNOSIS — R002 Palpitations: Secondary | ICD-10-CM

## 2023-05-12 DIAGNOSIS — I351 Nonrheumatic aortic (valve) insufficiency: Secondary | ICD-10-CM

## 2023-05-12 MED ORDER — ISOSORBIDE MONONITRATE ER 30 MG PO TB24: 30.0000 mg | ORAL_TABLET | Freq: Every day | ORAL | 3 refills | Status: DC

## 2023-05-12 NOTE — Patient Instructions (Signed)
Medication Instructions:  START IMDUR 30 MG DAILY *If you need a refill on your cardiac medications before your next appointment, please call your pharmacy*   Lab Work: NO LABS If you have labs (blood work) drawn today and your tests are completely normal, you will receive your results only by: MyChart Message (if you have MyChart) OR A paper copy in the mail If you have any lab test that is abnormal or we need to change your treatment, we will call you to review the results.   Testing/Procedures:[address] Your physician has requested that you have an echocardiogram. Echocardiography is a painless test that uses sound waves to create images of your heart. It provides your doctor with information about the size and shape of your heart and how well your heart's chambers and valves are working. This procedure takes approximately one hour. There are no restrictions for this procedure. Please do NOT wear cologne, perfume, aftershave, or lotions (deodorant is allowed). Please arrive 15 minutes prior to your appointment time.  Please note: We ask at that you not bring children with you during ultrasound (echo/ vascular) testing. Due to room size and safety concerns, children are not allowed in the ultrasound rooms during exams. Our front office staff cannot provide observation of children in our lobby area while testing is being conducted. An adult accompanying a patient to their appointment will only be allowed in the ultrasound room at the discretion of the ultrasound technician under special circumstances. We apologize for any inconvenience.    Follow-Up: At [facility], you and your health needs are our priority.  As part of our continuing mission to provide you with exceptional heart care, we have created designated Provider Care Teams.  These Care Teams include your primary Cardiologist (physician) and Advanced Practice Providers (APPs -  Physician Assistants and Nurse  Practitioners) who all work together to provide you with the care you need, when you need it.  Your next appointment:   Next Available   Provider:   Chrystie Nose, MD  and 1 month with Gillian Shields, NP for Hypertension Clinic   Other Instructions

## 2023-05-13 ENCOUNTER — Encounter: Payer: Self-pay | Admitting: Cardiology

## 2023-05-13 NOTE — Telephone Encounter (Signed)
Patient was seen by K. Shelva Majestic, NP, on 05/12/23.

## 2023-05-18 ENCOUNTER — Ambulatory Visit: Payer: 59

## 2023-05-27 ENCOUNTER — Ambulatory Visit (HOSPITAL_COMMUNITY)
Admission: RE | Admit: 2023-05-27 | Discharge: 2023-05-27 | Disposition: A | Discharge status: Home / Self Care | Payer: 59 | Source: Ambulatory Visit | Attending: Cardiology | Admitting: Cardiology

## 2023-05-27 DIAGNOSIS — R0602 Shortness of breath: Secondary | ICD-10-CM | POA: Insufficient documentation

## 2023-05-27 LAB — ECHOCARDIOGRAM COMPLETE
AR max vel: 3.76 cm2
AV Area VTI: 3.76 cm2
AV Area mean vel: 3.9 cm2
AV Mean grad: 5 mm[Hg]
AV Peak grad: 9.5 mm[Hg]
Ao pk vel: 1.54 m/s
Area-P 1/2: 3.91 cm2
Calc EF: 58.7 %
P 1/2 time: 678 ms
S' Lateral: 3.3 cm
Single Plane A2C EF: 57.3 %
Single Plane A4C EF: 58.2 %

## 2023-05-27 NOTE — Progress Notes (Signed)
  Echocardiogram 2D Echocardiogram has been performed.  Janalyn Harder 05/27/2023, 11:33 AM

## 2023-05-30 ENCOUNTER — Encounter: Payer: Self-pay | Admitting: Cardiovascular Disease

## 2023-05-31 ENCOUNTER — Telehealth: Payer: Self-pay

## 2023-05-31 NOTE — Telephone Encounter (Signed)
-----   Message from Joylene Grapes sent at 05/30/2023  5:27 PM EST ----- Covering Bethany Wang's inbox. Recent echocardiogram was reviewed by Dr. Blanchie Dessert partner, Dr. Royann Shivers, who states most recent echocardiogram "report overestimates the severity of the aortic insufficiency which is probably no more than moderate. Periodic echo follow-up is indicated, but no immediate need for additional testing at this time." Will likely plan for repeat echocardiogram in 1 year, sooner if clinically indicated. Continue current medications and follow-up as planned. Thank you-EM

## 2023-05-31 NOTE — Telephone Encounter (Signed)
 Left message to call back

## 2023-06-02 ENCOUNTER — Telehealth: Payer: Self-pay | Admitting: Cardiology

## 2023-06-02 ENCOUNTER — Telehealth: Payer: Self-pay | Admitting: Internal Medicine

## 2023-06-02 ENCOUNTER — Encounter (HOSPITAL_BASED_OUTPATIENT_CLINIC_OR_DEPARTMENT_OTHER): Payer: 59 | Admitting: Family

## 2023-06-02 DIAGNOSIS — Z7901 Long term (current) use of anticoagulants: Secondary | ICD-10-CM | POA: Diagnosis not present

## 2023-06-02 DIAGNOSIS — R160 Hepatomegaly, not elsewhere classified: Secondary | ICD-10-CM | POA: Diagnosis not present

## 2023-06-02 DIAGNOSIS — Z1152 Encounter for screening for COVID-19: Secondary | ICD-10-CM | POA: Diagnosis not present

## 2023-06-02 DIAGNOSIS — Z7982 Long term (current) use of aspirin: Secondary | ICD-10-CM | POA: Diagnosis not present

## 2023-06-02 DIAGNOSIS — Z888 Allergy status to other drugs, medicaments and biological substances status: Secondary | ICD-10-CM | POA: Diagnosis not present

## 2023-06-02 DIAGNOSIS — Z20822 Contact with and (suspected) exposure to covid-19: Secondary | ICD-10-CM | POA: Diagnosis not present

## 2023-06-02 DIAGNOSIS — I169 Hypertensive crisis, unspecified: Secondary | ICD-10-CM

## 2023-06-02 DIAGNOSIS — R079 Chest pain, unspecified: Secondary | ICD-10-CM | POA: Diagnosis not present

## 2023-06-02 DIAGNOSIS — I129 Hypertensive chronic kidney disease with stage 1 through stage 4 chronic kidney disease, or unspecified chronic kidney disease: Secondary | ICD-10-CM | POA: Diagnosis not present

## 2023-06-02 DIAGNOSIS — R0789 Other chest pain: Secondary | ICD-10-CM | POA: Diagnosis not present

## 2023-06-02 DIAGNOSIS — K769 Liver disease, unspecified: Secondary | ICD-10-CM | POA: Diagnosis not present

## 2023-06-02 DIAGNOSIS — I351 Nonrheumatic aortic (valve) insufficiency: Secondary | ICD-10-CM | POA: Diagnosis not present

## 2023-06-02 DIAGNOSIS — R1901 Right upper quadrant abdominal swelling, mass and lump: Secondary | ICD-10-CM | POA: Diagnosis not present

## 2023-06-02 DIAGNOSIS — N189 Chronic kidney disease, unspecified: Secondary | ICD-10-CM | POA: Diagnosis not present

## 2023-06-02 DIAGNOSIS — R6889 Other general symptoms and signs: Secondary | ICD-10-CM | POA: Diagnosis not present

## 2023-06-02 DIAGNOSIS — R072 Precordial pain: Secondary | ICD-10-CM

## 2023-06-02 DIAGNOSIS — Z79899 Other long term (current) drug therapy: Secondary | ICD-10-CM | POA: Diagnosis not present

## 2023-06-02 DIAGNOSIS — I25118 Atherosclerotic heart disease of native coronary artery with other forms of angina pectoris: Secondary | ICD-10-CM | POA: Diagnosis not present

## 2023-06-02 DIAGNOSIS — Z91030 Bee allergy status: Secondary | ICD-10-CM | POA: Diagnosis not present

## 2023-06-02 DIAGNOSIS — I110 Hypertensive heart disease with heart failure: Secondary | ICD-10-CM | POA: Diagnosis not present

## 2023-06-02 DIAGNOSIS — Z7902 Long term (current) use of antithrombotics/antiplatelets: Secondary | ICD-10-CM | POA: Diagnosis not present

## 2023-06-02 DIAGNOSIS — R404 Transient alteration of awareness: Secondary | ICD-10-CM | POA: Diagnosis not present

## 2023-06-02 DIAGNOSIS — J45909 Unspecified asthma, uncomplicated: Secondary | ICD-10-CM | POA: Diagnosis not present

## 2023-06-02 DIAGNOSIS — Z91013 Allergy to seafood: Secondary | ICD-10-CM | POA: Diagnosis not present

## 2023-06-02 DIAGNOSIS — I2510 Atherosclerotic heart disease of native coronary artery without angina pectoris: Secondary | ICD-10-CM | POA: Diagnosis not present

## 2023-06-02 DIAGNOSIS — I25119 Atherosclerotic heart disease of native coronary artery with unspecified angina pectoris: Secondary | ICD-10-CM

## 2023-06-02 DIAGNOSIS — R569 Unspecified convulsions: Secondary | ICD-10-CM | POA: Diagnosis not present

## 2023-06-02 DIAGNOSIS — Z91018 Allergy to other foods: Secondary | ICD-10-CM | POA: Diagnosis not present

## 2023-06-02 DIAGNOSIS — I10 Essential (primary) hypertension: Secondary | ICD-10-CM | POA: Diagnosis not present

## 2023-06-02 DIAGNOSIS — I272 Pulmonary hypertension, unspecified: Secondary | ICD-10-CM | POA: Diagnosis not present

## 2023-06-02 DIAGNOSIS — Z880 Allergy status to penicillin: Secondary | ICD-10-CM | POA: Diagnosis not present

## 2023-06-02 DIAGNOSIS — Z7951 Long term (current) use of inhaled steroids: Secondary | ICD-10-CM | POA: Diagnosis not present

## 2023-06-02 DIAGNOSIS — Z955 Presence of coronary angioplasty implant and graft: Secondary | ICD-10-CM | POA: Diagnosis not present

## 2023-06-02 DIAGNOSIS — E041 Nontoxic single thyroid nodule: Secondary | ICD-10-CM | POA: Diagnosis not present

## 2023-06-02 DIAGNOSIS — E039 Hypothyroidism, unspecified: Secondary | ICD-10-CM | POA: Diagnosis not present

## 2023-06-02 DIAGNOSIS — Z743 Need for continuous supervision: Secondary | ICD-10-CM | POA: Diagnosis not present

## 2023-06-02 NOTE — Telephone Encounter (Signed)
ON-CALL CARDIOLOGY 06/02/23  Patient's name: Bethany Wang.   MRN: [id number].    DOB: 1965-09-30 Primary care provider: Donetta Potts, MD. Primary cardiologist: Dr. Sheilah Pigeon regarding this patient's care today: Patient currently at [facility] with a chief complaint of chest pain.  [facility] reached out to discuss her case w/ ED PA Ms. Amber Nodal.   Patient has a complex history which includes multivessel CAD and other cardiovascular risk factors.  She presents to [facility] with chief complaint of chest pain. Ongoing for the last 3 days. Took 2 sublingual nitroglycerin tablets at home without any significant relief and went to the ER for further evaluation. Review of systems are also positive for shortness of breath and possible near syncope.  Systolic blood pressures on arrival 196 mmHg  High sensitive troponins negative x 2 (per physician assistant initial troponin was 5 followed by 6)  EKG reported to be nonischemic as per ED PA Ms. Jill Alexanders.  Underlying rhythm sinus bradycardia with a heart rate of 55 bpm without underlying injury pattern.  Patient is concerned and would like to be transferred to [facility] for further evaluation.  Impression: Precordial pain. Hypertensive crisis on admission/arrival Multivessel CAD  Recommendations: [facility] informs me that there are no cardiac beds available at this time at  [facility]. Based on information provided to me it appears that the patient is not presenting with acute coronary syndrome. Her symptoms are probably multiplied due to uncontrolled hypertension at baseline. Shared decision was to monitor her at [facility] for now and address her blood pressures as her symptoms may improve. Will be more than happy to accept the patient's once we have beds available.  They will reach out back to [place] either tomorrow or tonight.  Most recent office note from 05/12/2023 reviewed.  Most recent  echocardiogram from 05/27/2023 notes preserved LVEF, grade 1 diastolic dysfunction, moderate aortic regurgitation.  Telephone encounter total time: 15 minutes.   Tessa Lerner, DO, [facility] [place]  [facility]  [address] [place], [place] [address] 06/02/2023 5:10 PM

## 2023-06-02 NOTE — Progress Notes (Deleted)
 Advanced Hypertension Clinic Initial Assessment:    Date:  06/02/2023   ID:  Bethany Wang, DOB 1965/11/29, MRN [id number]  PCP:  Donetta Potts, MD  Cardiologist:  Chrystie Nose, MD  Nephrologist:  Referring MD: Donetta Potts, MD   CC: Hypertension  History of Present Illness:    Bethany Wang is a 58 y.o. female with a hx of CAD (s/p DES to RCA 2013, DES to LAD 2014, cath 04/2016 and 10/2020 and 01/2022 patent LAD stent with jailed D2 by LAD stent and CTO RCA to L-R collaterals), HTN, DM2  Hospitalized July 2020 with chest pain with LHC with occlusion of RCA with previously placed coronary stent and left-to-right collaterals.  50% ISR of proximal LAD and 90% diagonal ostial narrowing.  Also 60 to 70% obtuse marginal narrowing of 50% distal circumflex disease.  Recommended for medical therapy.  Intolerant to Atorvastatin, Rosuvastatin and Repatha initiated. Repeat catheterization 10/2020, 03/2021 stable compared top previous with recommendation for medical management. Imdur adjusted to 60mg  BID. Trialed on Ranolazine but developed rash and discontinued.  Multiple interruptions in medical therapy. Seen 10/2021 noted to not be taking thyroid medications, Amlodipine, hydrochlorothiazide reportedly on hold by PCP. Amlodipine and hydrochlorothiazide resumed.   Saw Dr. Rennis Golden 01/20/22 for worsening angina with [facility] 01/22/22 revealing stable multivessel coronary disease. Medical therapy recommended.  Also suggested escalation of antihypertensive therapy as suspected supply/demand mismatch driving symptoms.   At visit 05/31/22 Chlorthalidone transitioned to Spironolactone, but did not tolerate with nausea.. At visit 07/01/22 Doxazosin 2mg  at bedtime initiated, has not been continued.   At visit 07/2022 multiple episdoes of syncope also concerning for seizure. Referred to neurology. Monitor with episodes of PSVT, Carvedilol switched to Metoprolol 100mg  BID. Echo 07/2022 normal LVEF, moderate  to severe AI.She saw Dr. Rennis Golden 10/01/22 and due to progressive chest pain 10/29/22, LHC 11/03/22 vessel CAD with no change, stable to last cardiac catheterization.    Several medication changes since that time: 02/2023 and was not taking several medications for past week.  Amlodipine and carvedilol were resumed.   03/21/2023 nephrology transitioned amlodipine nifedipine 30 mg daily.  Carvedilol increased to 12.5 mg twice daily.  04/25/23 nifedipine increased to 60 mg daily.   05/12/2023 noted occasional chest discomfort and elevated blood pressure.  Imdur 30 mg daily initiated.    Most recent echo updated 05/27/2023 with LVEF 55-60%, mild LVH,gr1dd, trivial MR, moderate AI by Dr. Erin Hearing review.  Secondary hypertension workup: 10/2021 Carotid duplex  no significant stenosis. 10/2021 MR abdomen "adrenal glands appear normal. No suspicious renal mass or hydronephrosis identified bilaterally. Tiny cortical cyst in lower pole right kidney" 02/2022 Cortisol, catecholamines, metanephrines, renin-aldosterone normal. 02/2022 Renal dopplers  mild to moderate right renal artery stenosis and no stenosis on left.   06/2022 sleep study no significant OSA but mild sleep apnea during REM, no CPAP recommended.   Presents today for follow up ***  Previous antihypertensives: Valsartan - itching Metoprolol - transition to Carvedilol Spironolactone - nausea, dehydration Doxazosin  Past Medical History:  Diagnosis Date   Anemia    Anxiety    Bilateral breast cysts 12/30/2015   Brain tumor (HCC)    Breast cancer (HCC)    Left Breast Cancer   Breast disorder    BV (bacterial vaginosis) 09/09/2015   Coronary atherosclerosis of native coronary artery    a. s/p DES to RCA in 2013 b. DES to LAD in 2014 c. cath in 04/2016 showing patent LAD stent with  D2 jailed by LAD stent and CTO of RCA with left to right collaterals present   Cyst of pharynx or nasopharynx    [name]   Decreased libido  12/25/2018   Depression    Diabetes mellitus without complication (HCC)    Essential hypertension    Falls    x 3-4 in past year   GERD (gastroesophageal reflux disease)    Headache    Heart attack (HCC)    x4   History of hematuria    Mixed hyperlipidemia    Personal history of chemotherapy    Left Breast Cancer   Personal history of radiation therapy    Left Breast Cancer   Pre-diabetes    PUD (peptic ulcer disease)    Seizures (HCC)    Shingles 09/09/2015   Stroke (HCC)    12-2017 on Plavix, only deficit is headaches   Suicide attempt (HCC)    3 attempts in remote past   Trichimoniasis 08/20/2019   Treated 08/20/19    Uterine cancer (HCC)    Vitamin D deficiency disease 04/03/2019    Past Surgical History:  Procedure Laterality Date   ABDOMINAL HYSTERECTOMY     BIOPSY  11/28/2019   Procedure: BIOPSY;  Surgeon: Malissa Hippo, MD;  [facility];  Service: Endoscopy;;  antral, gastric body    BIOPSY  08/18/2022   Procedure: BIOPSY;  Surgeon: Dolores Frame, MD;  [facility];  Service: Gastroenterology;;   BLADDER SURGERY     BREAST LUMPECTOMY Left    BREAST LUMPECTOMY Right 02/24/2018   Procedure: RIGHT BREAST LUMPECTOMY ERAS PATHWAY;  Surgeon: Griselda Miner, MD;  Location: [facility] OR;  Service: General;  Laterality: Right;   CARDIAC CATHETERIZATION N/A 05/10/2016   Procedure: Left Heart Cath and Coronary Angiography;  Surgeon: Lyn Records, MD;  Location: [facility] INVASIVE CV LAB;  Service: Cardiovascular;  Laterality: N/A;   CAUTERIZE INNER NOSE  07/13/2020   COLONOSCOPY  May 2010   Fleishman: normal rectum, internal hemorrhoids, , benign colonic polyp   COLONOSCOPY N/A 01/05/2017   Procedure: COLONOSCOPY;  Surgeon: Malissa Hippo, MD;  [facility];  Service: Endoscopy;  Laterality: N/A;  1:00   CORONARY PRESSURE/FFR STUDY N/A 10/23/2020   Procedure: INTRAVASCULAR PRESSURE WIRE/FFR STUDY;  Surgeon: Lyn Records, MD;  Location: [facility]  INVASIVE CV LAB;  Service: Cardiovascular;  Laterality: N/A;   CORONARY PRESSURE/FFR STUDY N/A 03/30/2021   Procedure: INTRAVASCULAR PRESSURE WIRE/FFR STUDY;  Surgeon: Yvonne Kendall, MD;  Location: [facility] INVASIVE CV LAB;  Service: Cardiovascular;  Laterality: N/A;   ESOPHAGEAL DILATION  11/28/2019   Procedure: ESOPHAGEAL DILATION;  Surgeon: Malissa Hippo, MD;  [facility];  Service: Endoscopy;;   ESOPHAGOGASTRODUODENOSCOPY     2001 Dr. Madilyn Fireman: distal esophagitis, small hiatal hernia,. Dr. Katrinka Blazing 2006? no records available currently, pt also reports  EGD a few years ago with Dr. Jena Gauss, do not have these reports anywhere in medical records   ESOPHAGOGASTRODUODENOSCOPY  06/02/2011   UJW:[id number] pill impaction as described above s/p dilation of a probable cervical esophageal web/bx abnormal esophageal and gastric mucosa. + H.pylori gastritis    ESOPHAGOGASTRODUODENOSCOPY (EGD) WITH PROPOFOL N/A 11/28/2019   Procedure: ESOPHAGOGASTRODUODENOSCOPY (EGD) WITH PROPOFOL;  Surgeon: Malissa Hippo, MD;  [facility];  Service: Endoscopy;  Laterality: N/A;  1020   ESOPHAGOGASTRODUODENOSCOPY (EGD) WITH PROPOFOL N/A 08/18/2022   Procedure: ESOPHAGOGASTRODUODENOSCOPY (EGD) WITH PROPOFOL;  Surgeon: Dolores Frame, MD;  [facility];  Service: Gastroenterology;  Laterality: N/A;  8:30AM;ASA 1   EYE SURGERY     Removed glass   HAND SURGERY Right    Left breast lumpectomy     Benign   LEFT HEART CATH AND CORONARY ANGIOGRAPHY N/A 10/25/2018   Procedure: LEFT HEART CATH AND CORONARY ANGIOGRAPHY;  Surgeon: Iran Ouch, MD;  [facility]: [facility] INVAS[facility] CV LAB;  Service: Cardiovascular;  Laterality: N/A;   LEFT HEART CATH AND CORONARY ANGIOGRAPHY N/A 10/23/2020   Procedure: LEFT HEART CATH AND CORONARY ANGIOGRAPHY;  Surgeon: Lyn Records, MD;  Location: [facility] INVAS[facility] CV LAB;  Service: Cardiovascular;  Laterality: N/A;   LEFT HEART CATH AND CORONARY ANGIOGRAPHY N/A 03/30/2021    Procedure: LEFT HEART CATH AND CORONARY ANGIOGRAPHY;  Surgeon: Yvonne Kendall, MD;  [facility]: [facility] CV [facility];  Service: Cardiovascular;  Laterality: N/A;   LEFT HEART CATH AND CORONARY ANGIOGRAPHY N/A 01/22/2022   Procedure: LEFT HEART CATH AND CORONARY ANGIOGRAPHY;  Surgeon: Tonny Bollman, MD;  Location: [facility] INVASIVE CV LAB;  Service: Cardiovascular;  Laterality: N/A;   LEFT HEART CATHETERIZATION WITH CORONARY ANGIOGRAM N/A 07/03/2014   Procedure: LEFT HEART CATHETERIZATION WITH CORONARY ANGIOGRAM;  Surgeon: Lyn Records, MD;  Location: [facility] CATH LAB;  Service: Cardiovascular;  Laterality: N/A;   POLYPECTOMY  01/05/2017   Procedure: POLYPECTOMY;  Surgeon: Malissa Hippo, MD;  [facility];  Service: Endoscopy;;  colon   RIGHT/LEFT HEART CATH AND CORONARY ANGIOGRAPHY N/A 11/03/2022   Procedure: RIGHT/LEFT HEART CATH AND CORONARY ANGIOGRAPHY;  Surgeon: Kathleene Hazel, MD;  [facility]: [facility] CV LAB;  Service: Cardiovascular;  Laterality: N/A;   SHOULDER SURGERY Left     Current Medications: No outpatient medications have been marked as taking for the 06/02/23 encounter (Appointment) with Alver Sorrow, NP.     Allergies:   Bee venom, Lipitor [atorvastatin], Penicillins, Shellfish allergy, Spironolactone, Tomato, Valsartan, Zofran [ondansetron], Crestor [rosuvastatin], and Ranolazine   Social History   Socioeconomic History   Marital status: Married    Spouse name: Luisa Hart   Number of children: 3   Years of education: Assoc   Highest education level: Not on file  Occupational History   Occupation: Retired    Comment: disability  Tobacco Use   Smoking status: Former    Current packs/day: 0.00    Average packs/day: 0.2 packs/day for 32.0 years (6.4 ttl pk-yrs)    Types: Cigarettes, Cigars    Start date: 07/05/1983    Quit date: 04/03/2012    Years since quitting: 11.1   Smokeless tobacco: Never  Vaping Use   Vaping status: Never Used  Substance and  Sexual Activity   Alcohol use: No    Alcohol/week: 0.0 standard drinks of alcohol   Drug use: No   Sexual activity: Yes    Birth control/protection: Surgical    Comment: hyst  Other Topics Concern   Not on file  Social History Narrative   Patient lives at new home with husband and extended family..On disability since age 7 yrs.   Social Drivers of Health   Financial Resource Strain: Medium Risk (01/13/2021)   Overall Financial Resource Strain (CARDIA)    Difficulty of Paying Living Expenses: Somewhat hard  Food Insecurity: No Food Insecurity (01/13/2021)   Hunger Vital Sign    Worried About Running Out of Food in the Last Year: Never true    Ran Out of Food in the Last Year: Never true  Transportation Needs: No Transportation Needs (  01/13/2021)   PRAPARE - Administrator, Civil Service (Medical): No    Lack of Transportation (Non-Medical): No  Physical Activity: Sufficiently Active (01/13/2021)   Exercise Vital Sign    Days of Exercise per Week: 7 days    Minutes of Exercise per Session: 60 min  Stress: Stress Concern Present (01/13/2021)   [employer] of Occupational Health - Occupational Stress Questionnaire    Feeling of Stress : To some extent  Social Connections: Moderately Isolated (01/13/2021)   Social Connection and Isolation Panel [NHANES]    Frequency of Communication with Friends and Family: Never    Frequency of Social Gatherings with Friends and Family: Never    Attends Religious Services: More than 4 times per year    Active Member of [employer] or Organizations: No    Attends Banker Meetings: Never    Marital Status: Married     Family History: The patient's family history includes ADD / ADHD in her daughter, son, and son; Alzheimer's disease in her paternal aunt; Aneurysm in her mother; Bipolar disorder in her daughter, son, and son; Cancer in her father, maternal uncle, and paternal aunt; Cirrhosis in her maternal uncle; Colon cancer in  her paternal grandfather; Heart attack in her brother; Heart disease in her brother; Mental illness in her daughter, son, and son.  ROS:   Please see the history of present illness.     All other systems reviewed and are negative.  EKGs/Labs/Other Studies Reviewed:        Recent Labs: 07/15/2022: TSH 4.050 11/05/2022: ALT 22; Hemoglobin 13.5; Platelets 241 04/25/2023: BNP 78.5; BUN 7; Creatinine, Ser 0.82; Potassium 4.2; Sodium 145   Recent Lipid Panel    Component Value Date/Time   CHOL 127 11/01/2022 1003   TRIG 62 11/01/2022 1003   HDL 54 11/01/2022 1003   CHOLHDL 2.4 11/01/2022 1003   VLDL 12 11/01/2022 1003   LDLCALC 61 11/01/2022 1003   LDLCALC 146 (H) 07/03/2020 1334    Physical Exam:   VS:  There were no vitals taken for this visit. , BMI There is no height or weight on file to calculate BMI. GENERAL:  Well appearing HEENT: Pupils equal round and reactive, fundi not visualized, oral mucosa unremarkable NECK:  No jugular venous distention, waveform within normal limits, carotid upstroke brisk and symmetric, no bruits, no thyromegaly LYMPHATICS:  No cervical adenopathy LUNGS:  Clear to auscultation bilaterally HEART:  RRR.  PMI not displaced or sustained,S1 and S2 within normal limits, no S3, no S4, no clicks, no rubs, no murmurs ABD:  Flat, positive bowel sounds normal in frequency in pitch, no bruits, no rebound, no guarding, no midline pulsatile mass, no hepatomegaly, no splenomegaly EXT:  2 plus pulses throughout, no edema, no cyanosis no clubbing SKIN:  No rashes no nodules.  NEURO:  Cranial nerves II through XII grossly intact, motor grossly intact throughout PSYCH:  Cognitively intact, oriented to person place and time   ASSESSMENT/PLAN:    HTN - BP not at goal <130/80. Catecholamines, metanephrines, renin aldosterone ratio, cortisol unremarkable. Reported intolerance to Spironolactone with nausea, dehydration. WE discussed returning to Chlorthalidone vs addition  of Doxazosin. Prefers to trial new agent. Rx Doxazosin 2mg  QHS.  10/2021 MR abdomen "adrenal glands appear normal. No suspicious renal mass or hydronephrosis identified bilaterally. Tiny cortical cyst in lower pole right kidney"  Hypothyroidism - Continue to follow with PCP.   CAD /  HLD, LDL goal <55 - Prior DES RCA  2013, DES LAD 2014. Most recent cath 10/2022. GDMT Aspirin, Plavix, Coreg, Imdur, Repatha***. Heart healthy diet and regular cardiovascular exercise encouraged.    Palpitations - Prior monitor PSVT. Continue Carvedilol***  Hx of CVA - Continue Repatha, aspirin.***  Screening for Secondary Hypertension:     01/29/2022   12:23 PM  Causes  Renovascular HTN Screened     - Comments 01/2022 renal duplex ordered  Sleep Apnea Screened     - Comments 01/2022 in lab sleep study  Thyroid Disease Screened     - Comments 10/2021 hypothyroidism per PCP  Hyperaldosteronism Screened     - Comments 01/2022 renin-aldosterone ordered  Pheochromocytoma Screened     - Comments prior MRI abdomen normal kidney no tumor noted  Cushing's Syndrome Screened     - Comments 01/2022 cortisol  Coarctation of the Aorta Screened     - Comments 2023 carotid duplex no stenosis  Compliance Screened    Relevant Labs/Studies:    Latest Ref Rng & Units 04/25/2023    2:03 PM 11/05/2022    1:00 PM 11/03/2022    3:57 PM  Basic Labs  Sodium 134 - 144 mmol/L 145  138  141   Potassium 3.5 - 5.2 mmol/L 4.2  3.6  3.6   Creatinine 0.57 - 1.00 mg/dL 1.61  0.96         Latest Ref Rng & Units 07/15/2022   12:13 PM 07/03/2020    1:34 PM  Thyroid   TSH 0.350 - 4.500 uIU/mL 4.050  1.84        Latest Ref Rng & Units 02/16/2022    8:00 AM  Renin/Aldosterone   Aldosterone 0.0 - 30.0 ng/dL 7.1   Aldos/Renin Ratio 0.0 - 30.0 27.3        Latest Ref Rng & Units 02/16/2022    8:00 AM  Metanephrines/Catecholamines   Epinephrine 0 - 62 pg/mL 39   Norepinephrine 0 - 874 pg/mL 620   Dopamine 0 - 48 pg/mL <30    Metanephrines 0.0 - 88.0 pg/mL <25.0   Normetanephrines  0.0 - 244.0 pg/mL 53.2        Latest Ref Rng & Units 11/01/2022   10:02 AM  Cortisol  Cortisol  ug/dL 04.5        40/98/1191    9:58 AM  Renovascular   Renal Artery [place] Completed Yes    Disposition:    FU with ***   Medication Adjustments/Labs and Tests Ordered: Current medicines are reviewed at length with the patient today.  Concerns regarding medicines are outlined above.  No orders of the defined types were placed in this encounter.  No orders of the defined types were placed in this encounter.  Signed, Alver Sorrow, NP  06/02/2023 11:51 AM    [facility]

## 2023-06-02 NOTE — Telephone Encounter (Signed)
Called patient advised of below they verbalized understanding. Patient voiced concerns about our response stating that she is not getting better. Patient then stated that they are having chest pain. Chest pain is currently having a dull chest pain radiating to the shoulder blades a 12/10 on the pain scale. Not relieving after taking first dose of Nitroglycerin is currently SOB w/ Dizziness, patient has taken 2nd dose while on the phone. Advised them if pain continues or worsens to call for an ambulance to take her to the hospital to get treated. Patient canceled today's appointment with Gillian Shields NP due to weather. Will keep eyes on patient's chart to make sure patient goes to hospital and send message to scheduling for HTN clinic f/u reschedule.

## 2023-06-02 NOTE — Telephone Encounter (Signed)
Patient is calling to inform us that she is currently at [facility] in [place] for chest pains. Patient stated she was having chest pain with numbness on the left side of her body, and lightheadedness. Patient's BP was 210/78 about 30 mins ago. Patient took her BP while on the phone and it was 199/77. Patient and her daughter Shanda Bumps are requesting a call back from the nurse at [phone]. Please advise.

## 2023-06-02 NOTE — Telephone Encounter (Signed)
Spoke with pt daughter, the patient is wanting to come to [place] because they are not telling her anything. Aware she would need to ask her attending to call us to see if she can be transferred. Patient voiced understanding

## 2023-06-08 DIAGNOSIS — I351 Nonrheumatic aortic (valve) insufficiency: Secondary | ICD-10-CM | POA: Diagnosis not present

## 2023-06-08 DIAGNOSIS — R079 Chest pain, unspecified: Secondary | ICD-10-CM | POA: Diagnosis not present

## 2023-06-08 DIAGNOSIS — I2510 Atherosclerotic heart disease of native coronary artery without angina pectoris: Secondary | ICD-10-CM | POA: Diagnosis not present

## 2023-06-08 DIAGNOSIS — I10 Essential (primary) hypertension: Secondary | ICD-10-CM | POA: Diagnosis not present

## 2023-06-09 ENCOUNTER — Encounter (HOSPITAL_BASED_OUTPATIENT_CLINIC_OR_DEPARTMENT_OTHER): Payer: Self-pay | Admitting: Family

## 2023-06-09 ENCOUNTER — Ambulatory Visit (HOSPITAL_BASED_OUTPATIENT_CLINIC_OR_DEPARTMENT_OTHER): Payer: 59 | Admitting: Family

## 2023-06-09 VITALS — BP 151/78 | HR 97 | Ht 64.0 in | Wt 153.0 lb

## 2023-06-09 DIAGNOSIS — I25119 Atherosclerotic heart disease of native coronary artery with unspecified angina pectoris: Secondary | ICD-10-CM | POA: Diagnosis not present

## 2023-06-09 DIAGNOSIS — Z8673 Personal history of transient ischemic attack (TIA), and cerebral infarction without residual deficits: Secondary | ICD-10-CM

## 2023-06-09 DIAGNOSIS — Z720 Tobacco use: Secondary | ICD-10-CM

## 2023-06-09 DIAGNOSIS — I10 Essential (primary) hypertension: Secondary | ICD-10-CM

## 2023-06-09 DIAGNOSIS — E782 Mixed hyperlipidemia: Secondary | ICD-10-CM

## 2023-06-09 NOTE — Patient Instructions (Addendum)
 Medication Instructions:  Continue your current medications.   *If you need a refill on your cardiac medications before your next appointment, please call your pharmacy*  Follow-Up: At [facility], you and your health needs are our priority.  As part of our continuing mission to provide you with exceptional heart care, we have created designated Provider Care Teams.  These Care Teams include your primary Cardiologist (physician) and Advanced Practice Providers (APPs -  Physician Assistants and Nurse Practitioners) who all work together to provide you with the care you need, when you need it.  We recommend signing up for the patient portal called "MyChart".  Sign up information is provided on this After Visit Summary.  MyChart is used to connect with patients for Virtual Visits (Telemedicine).  Patients are able to view lab/test results, encounter notes, upcoming appointments, etc.  Non-urgent messages can be sent to your provider as well.   To learn more about what you can do with MyChart, go to [email or web address].    Your next appointment:   As scheduled with Dr. Rennis Golden AND  In 6 months with Dr. Duke Salvia or Alver Sorrow, NP in [facility]   Other Instructions  MyChart username: [email or web address]  Password: Hello  Tips to Measure your Blood Pressure Correctly  Here's what you can do to ensure a correct reading:  Don't drink a caffeinated beverage or smoke during the 30 minutes before the test.  Sit quietly for five minutes before the test begins.  During the measurement, sit in a chair with your feet on the floor and your arm supported so your elbow is at about heart level.  The inflatable part of the cuff should completely cover at least 80% of your upper arm, and the cuff should be placed on bare skin, not over a shirt.  Don't talk during the measurement.   Blood pressure categories  Blood pressure category SYSTOLIC (upper number)  DIASTOLIC (lower  number)  Normal Less than 120 mm Hg and Less than 80 mm Hg  Elevated 120-129 mm Hg and Less than 80 mm Hg  High blood pressure: Stage 1 hypertension 130-139 mm Hg or 80-89 mm Hg  High blood pressure: Stage 2 hypertension 140 mm Hg or higher or 90 mm Hg or higher  Hypertensive crisis (consult your doctor immediately) Higher than 180 mm Hg and/or Higher than 120 mm Hg  Source: American Heart Association and American Stroke Association. For more on getting your blood pressure under control, buy Controlling Your Blood Pressure, a Special Health Report from [facility]   Blood Pressure Log   Date   Time  Blood Pressure  Example: Nov 1 9 AM 124/78

## 2023-06-09 NOTE — Progress Notes (Signed)
 Advanced Hypertension Clinic Assessment:    Date:  06/09/2023   ID:  Bethany Wang, DOB 08-18-1965, MRN [id number]  PCP:  Donetta Potts, MD  Cardiologist:  Chrystie Nose, MD  Nephrologist:  Referring MD: Donetta Potts, MD   CC: Hypertension  History of Present Illness:    Bethany Wang is a 58 y.o. female with a hx of CAD (s/p DES to RCA 2013, DES to LAD 2014, cath 04/2016 and 10/2020 and 01/2022 patent LAD stent with jailed D2 by LAD stent and CTO RCA to L-R collaterals), HTN, DM2  Hospitalized July 2020 with chest pain with LHC with occlusion of RCA with previously placed coronary stent and left-to-right collaterals.  50% ISR of proximal LAD and 90% diagonal ostial narrowing.  Also 60 to 70% obtuse marginal narrowing of 50% distal circumflex disease.  Recommended for medical therapy.  Intolerant to Atorvastatin, Rosuvastatin and Repatha initiated. Repeat catheterization 10/2020, 03/2021 stable compared top previous with recommendation for medical management. Imdur adjusted to 60mg  BID. Trialed on Ranolazine but developed rash and discontinued.  Multiple interruptions in medical therapy. Seen 10/2021 noted to not be taking thyroid medications, Amlodipine, hydrochlorothiazide reportedly on hold by PCP. Amlodipine and hydrochlorothiazide resumed.   Saw Dr. Rennis Golden 01/20/22 for worsening angina with [facility] 01/22/22 revealing stable multivessel coronary disease. Medical therapy recommended.  Also suggested escalation of antihypertensive therapy as suspected supply/demand mismatch driving symptoms.   At visit 05/31/22 Chlorthalidone transitioned to Spironolactone, but did not tolerate with nausea.. At visit 07/01/22 Doxazosin 2mg  at bedtime initiated, has not been continued.   At visit 07/2022 multiple episdoes of syncope also concerning for seizure. Referred to neurology. Monitor with episodes of PSVT, Carvedilol switched to Metoprolol 100mg  BID. Echo 07/2022 normal LVEF, moderate to  severe AI.She saw Dr. Rennis Golden 10/01/22 and due to progressive chest pain 10/29/22, LHC 11/03/22 vessel CAD with no change, stable to last cardiac catheterization.    Several medication changes since that time: 02/2023 and was not taking several medications for past week.  Amlodipine and carvedilol were resumed.   03/21/2023 nephrology transitioned amlodipine nifedipine 30 mg daily.  Carvedilol increased to 12.5 mg twice daily.  04/25/23 nifedipine increased to 60 mg daily.   05/12/2023 noted occasional chest discomfort and elevated blood pressure.  Imdur 30 mg daily initiated.    Most recent echo updated 05/27/2023 with LVEF 55-60%, mild LVH,gr1dd, trivial MR, moderate AI by Dr. Erin Hearing review.  Admitted 06/02/23 at [facility] in [place] with nitroglycerin drip in ED did not significantly improve pain. Imdur added. Significant social stressors were noted including loss of multiple family members and concerns with her ex-husband.  Secondary hypertension workup: 10/2021 Carotid duplex  no significant stenosis. 10/2021 MR abdomen "adrenal glands appear normal. No suspicious renal mass or hydronephrosis identified bilaterally. Tiny cortical cyst in lower pole right kidney" 02/2022 Cortisol, catecholamines, metanephrines, renin-aldosterone normal. 02/2022 Renal dopplers  mild to moderate right renal artery stenosis and no stenosis on left.   06/2022 sleep study no significant OSA but mild sleep apnea during REM, no CPAP recommended.   Presents today for follow up with her daughter. Moderate AI and echo reviewed in detail. No chest pain, pressure, tightness. She is using her Advair BID and Albuterol at lunchtime. Reports exertional dyspnea with no cough, wheeze. This is unchanged from previous. She brings BP log from home with readings 106/61, 101/62, 182/95 (prior to medications), 135/77, 143/90. Felt relatively weak with episodes of hypotension.   Previous antihypertensives: Valsartan -  itching Metoprolol -  transition to Carvedilol Spironolactone - nausea, dehydration Doxazosin  Past Medical History:  Diagnosis Date   Anemia    Anxiety    Bilateral breast cysts 12/30/2015   Brain tumor (HCC)    Breast cancer (HCC)    Left Breast Cancer   Breast disorder    BV (bacterial vaginosis) 09/09/2015   Coronary atherosclerosis of native coronary artery    a. s/p DES to RCA in 2013 b. DES to LAD in 2014 c. cath in 04/2016 showing patent LAD stent with D2 jailed by LAD stent and CTO of RCA with left to right collaterals present   Cyst of pharynx or nasopharynx    [name]   Decreased libido 12/25/2018   Depression    Diabetes mellitus without complication (HCC)    Essential hypertension    Falls    x 3-4 in past year   GERD (gastroesophageal reflux disease)    Headache    Heart attack (HCC)    x4   History of hematuria    Mixed hyperlipidemia    Personal history of chemotherapy    Left Breast Cancer   Personal history of radiation therapy    Left Breast Cancer   Pre-diabetes    PUD (peptic ulcer disease)    Seizures (HCC)    Shingles 09/09/2015   Stroke (HCC)    12-2017 on Plavix, only deficit is headaches   Suicide attempt (HCC)    3 attempts in remote past   Trichimoniasis 08/20/2019   Treated 08/20/19    Uterine cancer (HCC)    Vitamin D deficiency disease 04/03/2019    Past Surgical History:  Procedure Laterality Date   ABDOMINAL HYSTERECTOMY     BIOPSY  11/28/2019   Procedure: BIOPSY;  Surgeon: Malissa Hippo, MD;  [facility];  Service: Endoscopy;;  antral, gastric body    BIOPSY  08/18/2022   Procedure: BIOPSY;  Surgeon: Dolores Frame, MD;  [facility];  Service: Gastroenterology;;   BLADDER SURGERY     BREAST LUMPECTOMY Left    BREAST LUMPECTOMY Right 02/24/2018   Procedure: RIGHT BREAST LUMPECTOMY ERAS PATHWAY;  Surgeon: Griselda Miner, MD;  Location: [facility] OR;  Service: General;  Laterality: Right;   CARDIAC  CATHETERIZATION N/A 05/10/2016   Procedure: Left Heart Cath and Coronary Angiography;  Surgeon: Lyn Records, MD;  Location: [facility] INVASIVE CV LAB;  Service: Cardiovascular;  Laterality: N/A;   CAUTERIZE INNER NOSE  07/13/2020   COLONOSCOPY  May 2010   Fleishman: normal rectum, internal hemorrhoids, , benign colonic polyp   COLONOSCOPY N/A 01/05/2017   Procedure: COLONOSCOPY;  Surgeon: Malissa Hippo, MD;  [facility];  Service: Endoscopy;  Laterality: N/A;  1:00   CORONARY PRESSURE/FFR STUDY N/A 10/23/2020   Procedure: INTRAVASCULAR PRESSURE WIRE/FFR STUDY;  Surgeon: Lyn Records, MD;  Location: [facility] INVASIVE CV LAB;  Service: Cardiovascular;  Laterality: N/A;   CORONARY PRESSURE/FFR STUDY N/A 03/30/2021   Procedure: INTRAVASCULAR PRESSURE WIRE/FFR STUDY;  Surgeon: Yvonne Kendall, MD;  Location: [facility] INVASIVE CV LAB;  Service: Cardiovascular;  Laterality: N/A;   ESOPHAGEAL DILATION  11/28/2019   Procedure: ESOPHAGEAL DILATION;  Surgeon: Malissa Hippo, MD;  [facility];  Service: Endoscopy;;   ESOPHAGOGASTRODUODENOSCOPY     2001 Dr. Madilyn Fireman: distal esophagitis, small hiatal hernia,. Dr. Katrinka Blazing 2006? no records available currently, pt also reports  EGD a few years ago with Dr. Jena Gauss, do not have  these reports anywhere in medical records   ESOPHAGOGASTRODUODENOSCOPY  06/02/2011   ZOX:[id number] pill impaction as described above s/p dilation of a probable cervical esophageal web/bx abnormal esophageal and gastric mucosa. + H.pylori gastritis    ESOPHAGOGASTRODUODENOSCOPY (EGD) WITH PROPOFOL N/A 11/28/2019   Procedure: ESOPHAGOGASTRODUODENOSCOPY (EGD) WITH PROPOFOL;  Surgeon: Malissa Hippo, MD;  [facility];  Service: Endoscopy;  Laterality: N/A;  1020   ESOPHAGOGASTRODUODENOSCOPY (EGD) WITH PROPOFOL N/A 08/18/2022   Procedure: ESOPHAGOGASTRODUODENOSCOPY (EGD) WITH PROPOFOL;  Surgeon: Dolores Frame, MD;  [facility];  Service: Gastroenterology;   Laterality: N/A;  8:30AM;ASA 1   EYE SURGERY     Removed glass   HAND SURGERY Right    Left breast lumpectomy     Benign   LEFT HEART CATH AND CORONARY ANGIOGRAPHY N/A 10/25/2018   Procedure: LEFT HEART CATH AND CORONARY ANGIOGRAPHY;  Surgeon: Iran Ouch, MD;  [facility] CV LAB;  Service: Cardiovascular;  Laterality: N/A;   LEFT HEART CATH AND CORONARY ANGIOGRAPHY N/A 10/23/2020   Procedure: LEFT HEART CATH AND CORONARY ANGIOGRAPHY;  Surgeon: Lyn Records, MD;  Location: [facility] INVAS[facility] CV LAB;  Service: Cardiovascular;  Laterality: N/A;   LEFT HEART CATH AND CORONARY ANGIOGRAPHY N/A 03/30/2021   Procedure: LEFT HEART CATH AND CORONARY ANGIOGRAPHY;  Surgeon: Yvonne Kendall, MD;  Location: [facility] CV LAB;  Service: Cardiovascular;  Laterality: N/A;   LEFT HEART CATH AND CORONARY ANGIOGRAPHY N/A 01/22/2022   Procedure: LEFT HEART CATH AND CORONARY ANGIOGRAPHY;  Surgeon: Tonny Bollman, MD;  Location: [facility] INVASIVE CV LAB;  Service: Cardiovascular;  Laterality: N/A;   LEFT HEART CATHETERIZATION WITH CORONARY ANGIOGRAM N/A 07/03/2014   Procedure: LEFT HEART CATHETERIZATION WITH CORONARY ANGIOGRAM;  Surgeon: Lyn Records, MD;  Location: [facility] CATH LAB;  Service: Cardiovascular;  Laterality: N/A;   POLYPECTOMY  01/05/2017   Procedure: POLYPECTOMY;  Surgeon: Malissa Hippo, MD;  [facility];  Service: Endoscopy;;  colon   RIGHT/LEFT HEART CATH AND CORONARY ANGIOGRAPHY N/A 11/03/2022   Procedure: RIGHT/LEFT HEART CATH AND CORONARY ANGIOGRAPHY;  Surgeon: Kathleene Hazel, MD;  [facility]: [facility] CV [facility];  Service: Cardiovascular;  Laterality: N/A;   SHOULDER SURGERY Left     Current Medications: Current Meds  Medication Sig   albuterol (VENTOLIN HFA) 108 (90 Base) MCG/ACT inhaler Inhale 2 puffs into the lungs every 4 (four) hours as needed for wheezing or shortness of breath. (Patient taking differently: Inhale 2 puffs into the lungs as needed for wheezing or  shortness of breath.)   ALPRAZolam (XANAX) 0.5 MG tablet Take 1 tablet (0.5 mg total) by mouth 2 (two) times daily as needed for anxiety. (Patient taking differently: Take 0.5 mg by mouth daily as needed for anxiety.)   aspirin EC 81 MG tablet Take 1 tablet (81 mg total) by mouth daily. Swallow whole.   carvedilol (COREG) 12.5 MG tablet Take 1 tablet (12.5 mg total) by mouth 2 (two) times daily.   Cholecalciferol (DIALYVITE VITAMIN D 5000) 125 MCG (5000 UT) capsule Take 5,000 Units by mouth in the morning.   clopidogrel (PLAVIX) 75 MG tablet Take 1 tablet (75 mg total) by mouth daily.   escitalopram (LEXAPRO) 20 MG tablet Take 1 tablet (20 mg total) by mouth daily.   Evolocumab (REPATHA SURECLICK [place]) Inject 140 mg into the skin every 14 (fourteen) days.   fluorometholone (FML) 0.1 % ophthalmic suspension Place 1 drop into both eyes daily.   fluticasone-salmeterol (ADVAIR) 100-50 MCG/ACT  AEPB INHALE 1 PUFF BY MOUTH TWICE DAILY (Patient taking differently: Inhale 1 puff into the lungs as needed (wheezing/SOB).)   gabapentin (NEURONTIN) 100 MG capsule Take 1 capsule (100 mg total) by mouth 3 (three) times daily.   isosorbide mononitrate (IMDUR) 30 MG 24 hr tablet Take 1 tablet (30 mg total) by mouth daily.   linaclotide (LINZESS) 145 MCG CAPS capsule Take 145 mcg by mouth daily before breakfast.   NIFEdipine (PROCARDIA XL/NIFEDICAL XL) 60 MG 24 hr tablet Take 1 tablet (60 mg total) by mouth daily.   nitroGLYCERIN (NITROSTAT) 0.4 MG SL tablet DISSOLVE 1 TABLET UNDER THE TONGUE AS NEEDED FOR CHEST PAIN EVERY 5 MINUTES UP TO 3 TIMES. IF NO RELIEF CALL 911. (Patient taking differently: Place 0.4 mg under the tongue every 5 (five) minutes as needed for chest pain.)   NP THYROID 60 MG tablet Take 60 mg by mouth every morning.   prochlorperazine (COMPAZINE) 10 MG tablet Take 1 tablet (10 mg total) by mouth every 6 (six) hours as needed for nausea or vomiting.   RESTASIS 0.05 % ophthalmic emulsion Place 2  drops into both eyes as needed (dry eye).   traZODone (DESYREL) 50 MG tablet Take 50 mg by mouth at bedtime as needed for sleep.     Allergies:   Bee venom, Lipitor [atorvastatin], Penicillins, Shellfish allergy, Spironolactone, Tomato, Valsartan, Zofran [ondansetron], Crestor [rosuvastatin], and Ranolazine   Social History   Socioeconomic History   Marital status: Married    Spouse name: Luisa Hart   Number of children: 3   Years of education: Assoc   Highest education level: Not on file  Occupational History   Occupation: Retired    Comment: disability  Tobacco Use   Smoking status: Former    Current packs/day: 0.00    Average packs/day: 0.2 packs/day for 32.0 years (6.4 ttl pk-yrs)    Types: Cigarettes, Cigars    Start date: 07/05/1983    Quit date: 04/03/2012    Years since quitting: 11.1   Smokeless tobacco: Never  Vaping Use   Vaping status: Never Used  Substance and Sexual Activity   Alcohol use: No    Alcohol/week: 0.0 standard drinks of alcohol   Drug use: No   Sexual activity: Yes    Birth control/protection: Surgical    Comment: hyst  Other Topics Concern   Not on file  Social History Narrative   Patient lives at new home with husband and extended family..On disability since age 78 yrs.   Social Drivers of Corporate investment banker Strain: Low Risk  (06/03/2023)   Received from [facility]   Overall Financial Resource Strain (CARDIA)    Difficulty of Paying Living Expenses: Not very hard  Food Insecurity: No Food Insecurity (06/03/2023)   Received from [facility]   Hunger Vital Sign    Worried About Running Out of Food in the Last Year: Never true    Ran Out of Food in the Last Year: Never true  Transportation Needs: No Transportation Needs (06/03/2023)   Received from [facility] - Transportation    Lack of Transportation (Medical): No    Lack of Transportation (Non-Medical): No  Physical Activity: Sufficiently Active (01/13/2021)    Exercise Vital Sign    Days of Exercise per Week: 7 days    Minutes of Exercise per Session: 60 min  Stress: Stress Concern Present (01/13/2021)   [employer] of Occupational Health - Occupational Stress Questionnaire  Feeling of Stress : To some extent  Social Connections: Moderately Isolated (01/13/2021)   Social Connection and Isolation Panel [NHANES]    Frequency of Communication with Friends and Family: Never    Frequency of Social Gatherings with Friends and Family: Never    Attends Religious Services: More than 4 times per year    Active Member of [employer] or Organizations: No    Attends Banker Meetings: Never    Marital Status: Married     Family History: The patient's family history includes ADD / ADHD in her daughter, son, and son; Alzheimer's disease in her paternal aunt; Aneurysm in her mother; Bipolar disorder in her daughter, son, and son; Cancer in her father, maternal uncle, and paternal aunt; Cirrhosis in her maternal uncle; Colon cancer in her paternal grandfather; Heart attack in her brother; Heart disease in her brother; Mental illness in her daughter, son, and son.  ROS:   Please see the history of present illness.     All other systems reviewed and are negative.  EKGs/Labs/Other Studies Reviewed:        Recent Labs: 07/15/2022: TSH 4.050 11/05/2022: ALT 22; Hemoglobin 13.5; Platelets 241 04/25/2023: BNP 78.5; BUN 7; Creatinine, Ser 0.82; Potassium 4.2; Sodium 145   Recent Lipid Panel    Component Value Date/Time   CHOL 127 11/01/2022 1003   TRIG 62 11/01/2022 1003   HDL 54 11/01/2022 1003   CHOLHDL 2.4 11/01/2022 1003   VLDL 12 11/01/2022 1003   LDLCALC 61 11/01/2022 1003   LDLCALC 146 (H) 07/03/2020 1334    Physical Exam:   VS:  BP (!) 151/78   Pulse 97   Ht 5\' 4"  (1.626 m)   Wt 153 lb (69.4 kg)   SpO2 97%   BMI 26.26 kg/m  , BMI Body mass index is 26.26 kg/m. GENERAL:  Well appearing HEENT: Pupils equal round and reactive,  fundi not visualized, oral mucosa unremarkable NECK:  No jugular venous distention, waveform within normal limits, carotid upstroke brisk and symmetric, no bruits, no thyromegaly LYMPHATICS:  No cervical adenopathy LUNGS:  Clear to auscultation bilaterally HEART:  RRR.  PMI not displaced or sustained,S1 and S2 within normal limits, no S3, no S4, no clicks, no rubs, no murmurs ABD:  Flat, positive bowel sounds normal in frequency in pitch, no bruits, no rebound, no guarding, no midline pulsatile mass, no hepatomegaly, no splenomegaly EXT:  2 plus pulses throughout, no edema, no cyanosis no clubbing SKIN:  No rashes no nodules.  NEURO:  Cranial nerves II through XII grossly intact, motor grossly intact throughout PSYCH:  Cognitively intact, oriented to person place and time   ASSESSMENT/PLAN:    HTN - BP elevated in clinic but reasonably controlled by home readings including 106/61, 101/62, 135/77, 143/90.  Average BP at home 122/73. Catecholamines, metanephrines, renin aldosterone ratio, cortisol unremarkable. 10/2021 normal adrenal glands.  Discussed to monitor BP at home at least 2 hours after medications and sitting for 5-10 minutes.  Continue Coreg 12.5mg  BID, Imdur 30mg  daily, Nifedipine 60mg  daily. If needed in future for BP control, could increase doses.  Hypothyroidism - Continue to follow with PCP.   CAD /  HLD, LDL goal <55 - Prior DES RCA 2013, DES LAD 2014. Most recent cath 10/2022. GDMT Aspirin 81mg  daily, Plavix 75mg  daily, Coreg 12.5mg  BID, Imdur 30mg  daily, Repatha140mg  q2 weeks. Heart healthy diet and regular cardiovascular exercise encouraged.    Palpitations - Prior monitor PSVT. Continue Carvedilol 12.5mg  BID. Denies palpitations.  Hx of CVA - Continue Repatha 140mg  q14 days, aspirin 81mg  daily, plavix 75 mg daily.   Moderate AI - Noted by echo 05/27/23. Continue optimal BP control to prevent progression. Monitor with periodic echo, consider repeat echo in 2-3 years.    COPD / Tobacco use - Anticipate this is etiology of her exertional dyspnea. No wheeze, cough. Continue present inhalers per PCP. Smoking cessation encouraged. Recommend utilization of [id number].   Screening for Secondary Hypertension:     01/29/2022   12:23 PM  Causes  Renovascular HTN Screened     - Comments 01/2022 renal duplex ordered  Sleep Apnea Screened     - Comments 01/2022 in lab sleep study  Thyroid Disease Screened     - Comments 10/2021 hypothyroidism per PCP  Hyperaldosteronism Screened     - Comments 01/2022 renin-aldosterone ordered  Pheochromocytoma Screened     - Comments prior MRI abdomen normal kidney no tumor noted  Cushing's Syndrome Screened     - Comments 01/2022 cortisol  Coarctation of the Aorta Screened     - Comments 2023 carotid duplex no stenosis  Compliance Screened    Relevant Labs/Studies:    Latest Ref Rng & Units 04/25/2023    2:03 PM 11/05/2022    1:00 PM 11/03/2022    3:57 PM  Basic Labs  Sodium 134 - 144 mmol/L 145  138  141   Potassium 3.5 - 5.2 mmol/L 4.2  3.6  3.6   Creatinine 0.57 - 1.00 mg/dL 6.96  2.95         Latest Ref Rng & Units 07/15/2022   12:13 PM 07/03/2020    1:34 PM  Thyroid   TSH 0.350 - 4.500 uIU/mL 4.050  1.84        Latest Ref Rng & Units 02/16/2022    8:00 AM  Renin/Aldosterone   Aldosterone 0.0 - 30.0 ng/dL 7.1   Aldos/Renin Ratio 0.0 - 30.0 27.3        Latest Ref Rng & Units 02/16/2022    8:00 AM  Metanephrines/Catecholamines   Epinephrine 0 - 62 pg/mL 39   Norepinephrine 0 - 874 pg/mL 620   Dopamine 0 - 48 pg/mL <30   Metanephrines 0.0 - 88.0 pg/mL <25.0   Normetanephrines  0.0 - 244.0 pg/mL 53.2        Latest Ref Rng & Units 11/01/2022   10:02 AM  Cortisol  Cortisol  ug/dL 28.4        13/24/4010    9:58 AM  Renovascular   Renal Artery [place] Completed Yes    Disposition:    FU with Dr. Rennis Golden as scheduled and  Advanced Hypertension Clinic in 6 months   Medication Adjustments/Labs and Tests  Ordered: Current medicines are reviewed at length with the patient today.  Concerns regarding medicines are outlined above.  No orders of the defined types were placed in this encounter.  No orders of the defined types were placed in this encounter.  Signed, Alver Sorrow, NP  06/09/2023 8:51 PM    [facility]

## 2023-06-14 ENCOUNTER — Ambulatory Visit (INDEPENDENT_AMBULATORY_CARE_PROVIDER_SITE_OTHER): Admitting: Gastroenterology

## 2023-06-14 ENCOUNTER — Encounter (INDEPENDENT_AMBULATORY_CARE_PROVIDER_SITE_OTHER): Payer: Self-pay | Admitting: Gastroenterology

## 2023-06-14 VITALS — BP 142/78 | HR 53 | Temp 97.2°F | Ht 64.0 in | Wt 152.6 lb

## 2023-06-14 DIAGNOSIS — K2270 Barrett's esophagus without dysplasia: Secondary | ICD-10-CM | POA: Diagnosis not present

## 2023-06-14 DIAGNOSIS — K219 Gastro-esophageal reflux disease without esophagitis: Secondary | ICD-10-CM

## 2023-06-14 DIAGNOSIS — K5909 Other constipation: Secondary | ICD-10-CM | POA: Diagnosis not present

## 2023-06-14 DIAGNOSIS — R1310 Dysphagia, unspecified: Secondary | ICD-10-CM

## 2023-06-14 MED ORDER — PANTOPRAZOLE SODIUM 40 MG PO TBEC: 40.0000 mg | DELAYED_RELEASE_TABLET | Freq: Every day | ORAL | 1 refills | Status: DC

## 2023-06-14 NOTE — Progress Notes (Unsigned)
 Referring Provider: Donetta Potts, MD Primary Care Physician:  Donetta Potts, MD Primary GI Physician: Dr. Levon Hedger   Chief Complaint  Patient presents with   Dysphagia    Patient here today with concerns over having issues with her swallowing. She says this has been ongoing for years,but it is worsening. She says Dr. Particia Nearing also wanted her to have a EGD and TCS. Patient does not take any ppi's. Patient says she has tried ppi's in the past and there was one particular one that caused her to have hives. She does not know the name of it and will investigate this and call us back with the name of the medication.    HPI:   JING HOWATT is a 58 y.o. female with past medical history of breast cancer status post chemoradiation, coronary artery disease status post stent placement, brain tumor, depression, diabetes, hypertension, hyperlipidemia, stroke, seizures, MI with cardiac stents  Patient presenting today for:  Dysphagia  Barrett's esophagus  GERD Constipation   Patient last seen July 2024, at that time, patient reported intermittent abdominal and epigastric pain for the past 3 to 4 weeks.  Having some nausea.  CT angio abdomen with contrast on 07/2020 which was negative for enteric ischemia.  Patient recommended schedule CT A/P with contrast, start compazine PRN for nausea and check AM cortisol   Cortisol levels July 2024 were WNL   Present: Patient reports she is having some issues with dysphagia since October but only with her pills. Denies issues with foods or water. She notes she has had dysphagia with pills for years. She reports she as advised she cannot take pills with applesauce or yogurt. She reports she has also had a lot of burning in her stomach. She notes that pills stick in her throat and sometimes sit for hours and she can feel them dissolving. She denies any recent antibiotics or steroids for anything. She denies heartburn or acid regurgitation. She drinks  mylanta and ice cold water to help with the burning in her stomach. She notes that burning in her stomach is usually present after taking her pills in the morning. She had EGD in may 2024 and was advised to start omeprazole 40mg  daily which she states made her sick on her stomach. She avoids meats, she only eats raw fruits and veggies, she eats a fairly bland diet.  Labs in February with TSH 8.359, was advised to restart her thyroid medication but is waiting on the pharmacy to get her medication.  She is having a BM very rarely, she reports maybe once per month and is taking linzess daily.  Water intake is good. Denies rectal bleeding or melena.    Last EGD: 08/2022 salmon colored mucosa, erosive gastropathy with no stigmata of recent bleeding  STOMACH, BIOPSY:  - Gastric antral and oxyntic mucosa with mild chronic gastritis  - Intestinal metaplasia of antral mucosa, negative for dysplasia  - See comment  B. ESOPHAGUS, AT 40 CM, BIOPSY:  - Barretts esophagus, negative for dysplasia   Recommend a repeat EGD in 3 years for Barretts surveillance and gastric mapping   Last Colonoscopy: 01/05/2017 6 polyps were removed from the sigmoid colon (1 tubular adenoma, rest were hyperplastic polyps), external hemorrhoids  Repeat in 7 years  Filed Weights   06/14/23 1459  Weight: 152 lb 9.6 oz (69.2 kg)     Past Medical History:  Diagnosis Date   Anemia    Anxiety    Bilateral breast  cysts 12/30/2015   Brain tumor (HCC)    Breast cancer (HCC)    Left Breast Cancer   Breast disorder    BV (bacterial vaginosis) 09/09/2015   Coronary atherosclerosis of native coronary artery    a. s/p DES to RCA in 2013 b. DES to LAD in 2014 c. cath in 04/2016 showing patent LAD stent with D2 jailed by LAD stent and CTO of RCA with left to right collaterals present   Cyst of pharynx or nasopharynx    [name]   Decreased libido 12/25/2018   Depression    Diabetes mellitus without  complication (HCC)    Essential hypertension    Falls    x 3-4 in past year   GERD (gastroesophageal reflux disease)    Headache    Heart attack (HCC)    x4   History of hematuria    Mixed hyperlipidemia    Personal history of chemotherapy    Left Breast Cancer   Personal history of radiation therapy    Left Breast Cancer   Pre-diabetes    PUD (peptic ulcer disease)    Seizures (HCC)    Shingles 09/09/2015   Stroke (HCC)    12-2017 on Plavix, only deficit is headaches   Suicide attempt (HCC)    3 attempts in remote past   Trichimoniasis 08/20/2019   Treated 08/20/19    Uterine cancer (HCC)    Vitamin D deficiency disease 04/03/2019    Past Surgical History:  Procedure Laterality Date   ABDOMINAL HYSTERECTOMY     BIOPSY  11/28/2019   Procedure: BIOPSY;  Surgeon: Malissa Hippo, MD;  [facility];  Service: Endoscopy;;  antral, gastric body    BIOPSY  08/18/2022   Procedure: BIOPSY;  Surgeon: Dolores Frame, MD;  [facility];  Service: Gastroenterology;;   BLADDER SURGERY     BREAST LUMPECTOMY Left    BREAST LUMPECTOMY Right 02/24/2018   Procedure: RIGHT BREAST LUMPECTOMY ERAS PATHWAY;  Surgeon: Griselda Miner, MD;  Location: [facility] OR;  Service: General;  Laterality: Right;   CARDIAC CATHETERIZATION N/A 05/10/2016   Procedure: Left Heart Cath and Coronary Angiography;  Surgeon: Lyn Records, MD;  Location: [facility] INVASIVE CV LAB;  Service: Cardiovascular;  Laterality: N/A;   CAUTERIZE INNER NOSE  07/13/2020   COLONOSCOPY  May 2010   Fleishman: normal rectum, internal hemorrhoids, , benign colonic polyp   COLONOSCOPY N/A 01/05/2017   Procedure: COLONOSCOPY;  Surgeon: Malissa Hippo, MD;  [facility];  Service: Endoscopy;  Laterality: N/A;  1:00   CORONARY PRESSURE/FFR STUDY N/A 10/23/2020   Procedure: INTRAVASCULAR PRESSURE WIRE/FFR STUDY;  Surgeon: Lyn Records, MD;  Location: [facility] INVAS[facility] CV LAB;  Service: Cardiovascular;  Laterality:  N/A;   CORONARY PRESSURE/FFR STUDY N/A 03/30/2021   Procedure: INTRAVASCULAR PRESSURE WIRE/FFR STUDY;  Surgeon: Yvonne Kendall, MD;  Location: [facility] INVASIVE CV LAB;  Service: Cardiovascular;  Laterality: N/A;   ESOPHAGEAL DILATION  11/28/2019   Procedure: ESOPHAGEAL DILATION;  Surgeon: Malissa Hippo, MD;  [facility];  Service: Endoscopy;;   ESOPHAGOGASTRODUODENOSCOPY     2001 Dr. Madilyn Fireman: distal esophagitis, small hiatal hernia,. Dr. Katrinka Blazing 2006? no records available currently, pt also reports  EGD a few years ago with Dr. Jena Gauss, do not have these reports anywhere in medical records   ESOPHAGOGASTRODUODENOSCOPY  06/02/2011   [id number] pill impaction as described above s/p dilation of a probable cervical esophageal web/bx abnormal esophageal and  gastric mucosa. + H.pylori gastritis    ESOPHAGOGASTRODUODENOSCOPY (EGD) WITH PROPOFOL N/A 11/28/2019   Procedure: ESOPHAGOGASTRODUODENOSCOPY (EGD) WITH PROPOFOL;  Surgeon: Malissa Hippo, MD;  [facility];  Service: Endoscopy;  Laterality: N/A;  1020   ESOPHAGOGASTRODUODENOSCOPY (EGD) WITH PROPOFOL N/A 08/18/2022   Procedure: ESOPHAGOGASTRODUODENOSCOPY (EGD) WITH PROPOFOL;  Surgeon: Dolores Frame, MD;  [facility];  Service: Gastroenterology;  Laterality: N/A;  8:30AM;ASA 1   EYE SURGERY     Removed glass   HAND SURGERY Right    Left breast lumpectomy     Benign   LEFT HEART CATH AND CORONARY ANGIOGRAPHY N/A 10/25/2018   Procedure: LEFT HEART CATH AND CORONARY ANGIOGRAPHY;  Surgeon: Iran Ouch, MD;  [facility]: [facility];  Service: Cardiovascular;  Laterality: N/A;   LEFT HEART CATH AND CORONARY ANGIOGRAPHY N/A 10/23/2020   Procedure: LEFT HEART CATH AND CORONARY ANGIOGRAPHY;  Surgeon: Lyn Records, MD;  Location: [facility];  Service: Cardiovascular;  Laterality: N/A;   LEFT HEART CATH AND CORONARY ANGIOGRAPHY N/A 03/30/2021   Procedure: LEFT HEART CATH AND CORONARY ANGIOGRAPHY;   Surgeon: Yvonne Kendall, MD;  Location: [facility];  Service: Cardiovascular;  Laterality: N/A;   LEFT HEART CATH AND CORONARY ANGIOGRAPHY N/A 01/22/2022   Procedure: LEFT HEART CATH AND CORONARY ANGIOGRAPHY;  Surgeon: Tonny Bollman, MD;  Location: [facility] INVASIVE CV LAB;  Service: Cardiovascular;  Laterality: N/A;   LEFT HEART CATHETERIZATION WITH CORONARY ANGIOGRAM N/A 07/03/2014   Procedure: LEFT HEART CATHETERIZATION WITH CORONARY ANGIOGRAM;  Surgeon: Lyn Records, MD;  Location: [facility] CATH LAB;  Service: Cardiovascular;  Laterality: N/A;   POLYPECTOMY  01/05/2017   Procedure: POLYPECTOMY;  Surgeon: Malissa Hippo, MD;  [facility];  Service: Endoscopy;;  colon   RIGHT/LEFT HEART CATH AND CORONARY ANGIOGRAPHY N/A 11/03/2022   Procedure: RIGHT/LEFT HEART CATH AND CORONARY ANGIOGRAPHY;  Surgeon: Kathleene Hazel, MD;  Location: [facility];  Service: Cardiovascular;  Laterality: N/A;   SHOULDER SURGERY Left     Current Outpatient Medications  Medication Sig Dispense Refill   albuterol (VENTOLIN HFA) 108 (90 Base) MCG/ACT inhaler Inhale 2 puffs into the lungs every 4 (four) hours as needed for wheezing or shortness of breath. (Patient taking differently: Inhale 2 puffs into the lungs as needed for wheezing or shortness of breath.) 1 each 3   ALPRAZolam (XANAX) 0.5 MG tablet Take 1 tablet (0.5 mg total) by mouth 2 (two) times daily as needed for anxiety. (Patient taking differently: Take 0.5 mg by mouth daily as needed for anxiety.) 30 tablet 0   aspirin EC 81 MG tablet Take 1 tablet (81 mg total) by mouth daily. Swallow whole. 90 tablet 3   carvedilol (COREG) 12.5 MG tablet Take 1 tablet (12.5 mg total) by mouth 2 (two) times daily. 180 tablet 3   Cholecalciferol (DIALYVITE VITAMIN D 5000) 125 MCG (5000 UT) capsule Take 5,000 Units by mouth in the morning.     clopidogrel (PLAVIX) 75 MG tablet Take 1 tablet (75 mg total) by mouth daily. 90 tablet 3   escitalopram  (LEXAPRO) 20 MG tablet Take 1 tablet (20 mg total) by mouth daily. 30 tablet 3   Evolocumab (REPATHA SURECLICK [place]) Inject 140 mg into the skin every 14 (fourteen) days.     fluorometholone (FML) 0.1 % ophthalmic suspension Place 1 drop into both eyes daily.     fluticasone-salmeterol (ADVAIR) 100-50 MCG/ACT AEPB INHALE 1 PUFF BY  MOUTH TWICE DAILY (Patient taking differently: Inhale 1 puff into the lungs as needed (wheezing/SOB).) 60 each 10   gabapentin (NEURONTIN) 100 MG capsule Take 1 capsule (100 mg total) by mouth 3 (three) times daily. 30 capsule 0   isosorbide mononitrate (IMDUR) 30 MG 24 hr tablet Take 1 tablet (30 mg total) by mouth daily. 90 tablet 3   linaclotide (LINZESS) 145 MCG CAPS capsule Take 145 mcg by mouth daily before breakfast.     NIFEdipine (PROCARDIA XL/NIFEDICAL XL) 60 MG 24 hr tablet Take 1 tablet (60 mg total) by mouth daily. 30 tablet 3   nitroGLYCERIN (NITROSTAT) 0.4 MG SL tablet DISSOLVE 1 TABLET UNDER THE TONGUE AS NEEDED FOR CHEST PAIN EVERY 5 MINUTES UP TO 3 TIMES. IF NO RELIEF CALL 911. (Patient taking differently: Place 0.4 mg under the tongue every 5 (five) minutes as needed for chest pain.) 25 tablet 1   NP THYROID 60 MG tablet Take 60 mg by mouth every morning.     prochlorperazine (COMPAZINE) 10 MG tablet Take 1 tablet (10 mg total) by mouth every 6 (six) hours as needed for nausea or vomiting. 90 tablet 1   RESTASIS 0.05 % ophthalmic emulsion Place 2 drops into both eyes as needed (dry eye).     traZODone (DESYREL) 50 MG tablet Take 50 mg by mouth at bedtime as needed for sleep.     No current facility-administered medications for this visit.    Allergies as of 06/14/2023 - Review Complete 06/14/2023  Allergen Reaction Noted   Bee venom Anaphylaxis and Other (See Comments) 01/05/2017   Lipitor [atorvastatin] Rash and Other (See Comments) 11/03/2020   Penicillins Anaphylaxis 01/26/2011   Shellfish allergy Swelling 03/14/2017   Spironolactone Other (See  Comments) 07/01/2022   Tomato Itching and Swelling 07/16/2020   Valsartan Itching 06/11/2021   Zofran [ondansetron] Itching 05/20/2020   Crestor [rosuvastatin] Rash and Other (See Comments) 03/26/2021   Ranolazine Rash 01/29/2022    Social History   Socioeconomic History   Marital status: Married    Spouse name: Luisa Hart   Number of children: 3   Years of education: Assoc   Highest education level: Not on file  Occupational History   Occupation: Retired    Comment: disability  Tobacco Use   Smoking status: Former    Current packs/day: 0.00    Average packs/day: 0.2 packs/day for 32.0 years (6.4 ttl pk-yrs)    Types: Cigarettes, Cigars    Start date: 07/05/1983    Quit date: 04/03/2012    Years since quitting: 11.2   Smokeless tobacco: Never  Vaping Use   Vaping status: Never Used  Substance and Sexual Activity   Alcohol use: No    Alcohol/week: 0.0 standard drinks of alcohol   Drug use: No   Sexual activity: Yes    Birth control/protection: Surgical    Comment: hyst  Other Topics Concern   Not on file  Social History Narrative   Patient lives at new home with husband and extended family..On disability since age 45 yrs.   Social Drivers of Corporate investment banker Strain: Low Risk  (06/03/2023)   [facility]   Overall Financial Resource Strain (CARDIA)    Difficulty of Paying Living Expenses: Not very hard  Food Insecurity: No Food Insecurity (06/03/2023)   [facility]   Hunger Vital Sign    Worried About Running Out of Food in the Last Year: Never true    Ran  Out of Food in the Last Year: Never true  Transportation Needs: No Transportation Needs (06/03/2023)   Received [facility]   [facility] - Transportation    Lack of Transportation (Medical): No    Lack of Transportation (Non-Medical): No  Physical Activity: Sufficiently Active (01/13/2021)   Exercise Vital Sign    Days of Exercise per Week: 7 days    Minutes of  Exercise per Session: 60 min  Stress: Stress Concern Present (01/13/2021)   [employer] of Occupational Health - Occupational Stress Questionnaire    Feeling of Stress : To some extent  Social Connections: Moderately Isolated (01/13/2021)   Social Connection and Isolation Panel [NHANES]    Frequency of Communication with Friends and Family: Never    Frequency of Social Gatherings with Friends and Family: Never    Attends Religious Services: More than 4 times per year    Active Member of [employer] or Organizations: No    Attends Engineer, structural: Never    Marital Status: Married    Review of systems General: negative for malaise, night sweats, fever, chills, weight los Neck: Negative for lumps, goiter, pain and significant neck swelling Resp: Negative for cough, wheezing, dyspnea at rest CV: Negative for chest pain, leg swelling, palpitations, orthopnea GI: denies melena, hematochezia, nausea, vomiting, diarrhea, constipation, dysphagia, odyonophagia, early satiety or unintentional weight loss.  MSK: Negative for joint pain or swelling, back pain, and muscle pain. Derm: Negative for itching or rash Psych: Denies depression, anxiety, memory loss, confusion. No homicidal or suicidal ideation.  Heme: Negative for prolonged bleeding, bruising easily, and swollen nodes. Endocrine: Negative for cold or heat intolerance, polyuria, polydipsia and goiter. Neuro: negative for tremor, gait imbalance, syncope and seizures. The remainder of the review of systems is noncontributory.  Physical Exam: BP (!) 142/78 (BP Location: Right Arm, Patient Position: Sitting, Cuff Size: Normal)   Pulse (!) 53   Temp (!) 97.2 F (36.2 C) (Temporal)   Ht 5\' 4"  (1.626 m)   Wt 152 lb 9.6 oz (69.2 kg)   BMI 26.19 kg/m  General:   Alert and oriented. No distress noted. Pleasant and cooperative.  Head:  Normocephalic and atraumatic. Eyes:  Conjuctiva clear without scleral icterus. Mouth:  Oral  mucosa pink and moist. Good dentition. No lesions. Heart: Normal rate and rhythm, s1 and s2 heart sounds present.  Lungs: Clear lung sounds in all lobes. Respirations equal and unlabored. Abdomen:  +BS, soft, non-tender and non-distended. No rebound or guarding. No HSM or masses noted. Derm: No palmar erythema or jaundice Msk:  Symmetrical without gross deformities. Normal posture. Extremities:  Without edema. Neurologic:  Alert and  oriented x4 Psych:  Alert and cooperative. Normal mood and affect.  Invalid input(s): "6 MONTHS"   ASSESSMENT: DANYLE BOENING is a 58 y.o. female presenting today for dysphagia, GERD/Barrett's esophagus, Constipation   PLAN:  -start protonix 40mg  daily -schedule EGD, ASA II, needs cards clearance   -repeat Colonoscopy in September -need good control over thyroid function -increase linzess to daily (pt will increase her to two pills for now) -Increase water intake, aim for atleast 64 oz per day -Increase fruits, veggies and whole grains, kiwi and prunes are especially good for constipation   All questions were answered, patient verbalized understanding and is in agreement with plan as outlined above.   Follow Up: 3 months   Stace Peace L. Jeanmarie Hubert, MSN, APRN, AGNP-C Adult-Gerontology Nurse Practitioner [facility]

## 2023-06-14 NOTE — Patient Instructions (Addendum)
-  start protonix 40mg  daily - we will schedule EGD -repeat Colonoscopy in September -need good control over thyroid function as this is likely contributing to constipation  -increase linzess to daily, please take 2 of your pills per day -Increase water intake, aim for atleast 64 oz per day -Increase fruits, veggies and whole grains, kiwi and prunes are especially good for constipation  Follow up 3 months  It was a pleasure to see you today. I want to create trusting relationships with patients and provide genuine, compassionate, and quality care. I truly value your feedback! please be on the lookout for a survey regarding your visit with me today. I appreciate your input about our visit and your time in completing this!    Lukas Pelcher L. Jeanmarie Hubert, MSN, APRN, AGNP-C Adult-Gerontology Nurse Practitioner [facility] at [facility]

## 2023-06-15 ENCOUNTER — Telehealth (INDEPENDENT_AMBULATORY_CARE_PROVIDER_SITE_OTHER): Payer: Self-pay | Admitting: Gastroenterology

## 2023-06-15 DIAGNOSIS — K2270 Barrett's esophagus without dysplasia: Secondary | ICD-10-CM | POA: Insufficient documentation

## 2023-06-15 DIAGNOSIS — K219 Gastro-esophageal reflux disease without esophagitis: Secondary | ICD-10-CM | POA: Insufficient documentation

## 2023-06-15 DIAGNOSIS — R1310 Dysphagia, unspecified: Secondary | ICD-10-CM | POA: Insufficient documentation

## 2023-06-15 DIAGNOSIS — K5909 Other constipation: Secondary | ICD-10-CM | POA: Insufficient documentation

## 2023-06-15 NOTE — Telephone Encounter (Signed)
 Ok to have EGD - hold Plavix 5 days prior and aspirin 7 days prior if necessary, then resume both after.  Dr Rexene Edison

## 2023-06-15 NOTE — Telephone Encounter (Signed)
    06/15/23  Bethany Wang 17-Apr-1965  What type of surgery is being performed? EGD with dilation  When is surgery scheduled? RBD  What type of clearance is required (medical or pharmacy to hold medication or both? Pharmacy to hold medication  Are there any medications that need to be held prior to surgery and how long? Plavix for 5 days prior  Name of physician performing surgery?  Dr. Katrinka Blazing [facility] at [facility] Phone: [phone] Fax: [phone]  Anethesia type (none, local, MAC, general)? Choice

## 2023-06-15 NOTE — Telephone Encounter (Signed)
   Patient Name: Bethany Wang  DOB: [birth year] MRN: [id number]  Primary Cardiologist: Chrystie Nose, MD  Chart reviewed as part of pre-operative protocol coverage. Given past medical history and time since last visit, based on ACC/AHA guidelines, [name] is at acceptable risk for the planned procedure without further cardiovascular testing.   She may hold Plavix for 5 days prior to procedure. Regarding ASA therapy, we recommend continuation of ASA throughout the perioperative period.  However, if the surgeon feels that cessation of ASA is required in the perioperative period, it may be stopped 5-7 days prior to surgery. Please resume Plavix and Aspirin as soon as possible postprocedure, at the discretion of the surgeon.    I will route this recommendation to the requesting party via Epic fax function and remove from pre-op pool.  Please call with questions.  Denyce Robert, NP 06/15/2023, 9:51 AM

## 2023-06-16 NOTE — Telephone Encounter (Signed)
 Agree with recommendations as provided by Dr. Rennis Golden and preop team.   Alver Sorrow, NP

## 2023-06-22 NOTE — Telephone Encounter (Signed)
 Please ask her to continue aspirin 81 mg, will need to hold Plavix per protocol Thanks

## 2023-06-22 NOTE — Telephone Encounter (Signed)
 Would you like pt to continue on ASA or stop ASA for procedure? Please advise. Thank you !

## 2023-06-23 DIAGNOSIS — R079 Chest pain, unspecified: Secondary | ICD-10-CM | POA: Diagnosis not present

## 2023-06-23 NOTE — Telephone Encounter (Signed)
 Left message to return call

## 2023-06-27 NOTE — Telephone Encounter (Signed)
 Left message to return call

## 2023-06-29 NOTE — Telephone Encounter (Signed)
 Pt contacted and scheduled. Instructions will be mailed once pre op has been received. No pa needed per insurance.

## 2023-07-05 DIAGNOSIS — H905 Unspecified sensorineural hearing loss: Secondary | ICD-10-CM | POA: Diagnosis not present

## 2023-07-12 ENCOUNTER — Encounter (HOSPITAL_COMMUNITY)
Admission: RE | Admit: 2023-07-12 | Discharge: 2023-07-12 | Disposition: A | Discharge status: Home / Self Care | Source: Ambulatory Visit | Attending: Gastroenterology | Admitting: Gastroenterology

## 2023-07-14 NOTE — Anesthesia Preprocedure Evaluation (Signed)
 Anesthesia Evaluation  Patient identified by MRN, date of birth, ID band Patient awake    Reviewed: Allergy & Precautions, H&P , NPO status , Patient's Chart, lab work & pertinent test results, reviewed documented beta blocker date and time   Airway Mallampati: II  TM Distance: >3 FB Neck ROM: full    Dental no notable dental hx. (+) Dental Advisory Given, Teeth Intact   Pulmonary neg pulmonary ROS, former smoker   Pulmonary exam normal breath sounds clear to auscultation       Cardiovascular Exercise Tolerance: Good hypertension, + angina  + CAD and + Past MI  Normal cardiovascular exam Rhythm:regular Rate:Normal     Neuro/Psych  Headaches, Seizures -,  PSYCHIATRIC DISORDERS Anxiety Depression    History brain tumor  Neuromuscular disease CVA    GI/Hepatic Neg liver ROS, PUD,GERD  ,,  Endo/Other  diabetes, Type 2    Renal/GU negative Renal ROS  negative genitourinary   Musculoskeletal   Abdominal   Peds  Hematology negative hematology ROS (+)   Anesthesia Other Findings Cyst of pharynx or nasopharynx????  bREAST CANCER  Reproductive/Obstetrics negative OB ROS                             Anesthesia Physical Anesthesia Plan  ASA: 3  Anesthesia Plan: General   Post-op Pain Management: Minimal or no pain anticipated   Induction: Intravenous  PONV Risk Score and Plan: Propofol infusion  Airway Management Planned: Nasal Cannula and Natural Airway  Additional Equipment: None  Intra-op Plan:   Post-operative Plan:   Informed Consent: I have reviewed the patients History and Physical, chart, labs and discussed the procedure including the risks, benefits and alternatives for the proposed anesthesia with the patient or authorized representative who has indicated his/her understanding and acceptance.     Dental Advisory Given  Plan Discussed with: CRNA  Anesthesia Plan Comments:         Anesthesia Quick Evaluation

## 2023-07-15 ENCOUNTER — Encounter (HOSPITAL_COMMUNITY)
Admission: RE | Disposition: A | Discharge status: Home / Self Care | Payer: Self-pay | Source: Home / Self Care | Attending: Gastroenterology

## 2023-07-15 ENCOUNTER — Ambulatory Visit (HOSPITAL_COMMUNITY)
Admission: RE | Admit: 2023-07-15 | Discharge: 2023-07-15 | Disposition: A | Discharge status: Home / Self Care | Attending: Gastroenterology | Admitting: Gastroenterology

## 2023-07-15 ENCOUNTER — Other Ambulatory Visit: Payer: Self-pay

## 2023-07-15 ENCOUNTER — Ambulatory Visit (HOSPITAL_COMMUNITY): Admitting: Anesthesiology

## 2023-07-15 ENCOUNTER — Encounter (HOSPITAL_COMMUNITY): Payer: Self-pay | Admitting: Gastroenterology

## 2023-07-15 DIAGNOSIS — Z955 Presence of coronary angioplasty implant and graft: Secondary | ICD-10-CM | POA: Insufficient documentation

## 2023-07-15 DIAGNOSIS — I10 Essential (primary) hypertension: Secondary | ICD-10-CM | POA: Insufficient documentation

## 2023-07-15 DIAGNOSIS — I2510 Atherosclerotic heart disease of native coronary artery without angina pectoris: Secondary | ICD-10-CM | POA: Diagnosis not present

## 2023-07-15 DIAGNOSIS — F419 Anxiety disorder, unspecified: Secondary | ICD-10-CM | POA: Diagnosis not present

## 2023-07-15 DIAGNOSIS — K279 Peptic ulcer, site unspecified, unspecified as acute or chronic, without hemorrhage or perforation: Secondary | ICD-10-CM | POA: Diagnosis not present

## 2023-07-15 DIAGNOSIS — E119 Type 2 diabetes mellitus without complications: Secondary | ICD-10-CM | POA: Diagnosis not present

## 2023-07-15 DIAGNOSIS — R1310 Dysphagia, unspecified: Secondary | ICD-10-CM | POA: Insufficient documentation

## 2023-07-15 DIAGNOSIS — E782 Mixed hyperlipidemia: Secondary | ICD-10-CM | POA: Insufficient documentation

## 2023-07-15 DIAGNOSIS — I25119 Atherosclerotic heart disease of native coronary artery with unspecified angina pectoris: Secondary | ICD-10-CM | POA: Insufficient documentation

## 2023-07-15 DIAGNOSIS — F32A Depression, unspecified: Secondary | ICD-10-CM | POA: Insufficient documentation

## 2023-07-15 DIAGNOSIS — Z79890 Hormone replacement therapy (postmenopausal): Secondary | ICD-10-CM | POA: Diagnosis not present

## 2023-07-15 DIAGNOSIS — R569 Unspecified convulsions: Secondary | ICD-10-CM | POA: Diagnosis not present

## 2023-07-15 DIAGNOSIS — Z79899 Other long term (current) drug therapy: Secondary | ICD-10-CM | POA: Insufficient documentation

## 2023-07-15 DIAGNOSIS — Z923 Personal history of irradiation: Secondary | ICD-10-CM | POA: Insufficient documentation

## 2023-07-15 DIAGNOSIS — R519 Headache, unspecified: Secondary | ICD-10-CM | POA: Diagnosis not present

## 2023-07-15 DIAGNOSIS — W448XXA Other foreign body entering into or through a natural orifice, initial encounter: Secondary | ICD-10-CM | POA: Diagnosis not present

## 2023-07-15 DIAGNOSIS — K219 Gastro-esophageal reflux disease without esophagitis: Secondary | ICD-10-CM | POA: Diagnosis not present

## 2023-07-15 DIAGNOSIS — I252 Old myocardial infarction: Secondary | ICD-10-CM | POA: Diagnosis not present

## 2023-07-15 DIAGNOSIS — Z7951 Long term (current) use of inhaled steroids: Secondary | ICD-10-CM | POA: Diagnosis not present

## 2023-07-15 DIAGNOSIS — T182XXA Foreign body in stomach, initial encounter: Secondary | ICD-10-CM

## 2023-07-15 DIAGNOSIS — Z853 Personal history of malignant neoplasm of breast: Secondary | ICD-10-CM | POA: Insufficient documentation

## 2023-07-15 DIAGNOSIS — Z8673 Personal history of transient ischemic attack (TIA), and cerebral infarction without residual deficits: Secondary | ICD-10-CM | POA: Insufficient documentation

## 2023-07-15 DIAGNOSIS — Z7902 Long term (current) use of antithrombotics/antiplatelets: Secondary | ICD-10-CM | POA: Insufficient documentation

## 2023-07-15 DIAGNOSIS — K2270 Barrett's esophagus without dysplasia: Secondary | ICD-10-CM | POA: Diagnosis not present

## 2023-07-15 DIAGNOSIS — Z87891 Personal history of nicotine dependence: Secondary | ICD-10-CM | POA: Insufficient documentation

## 2023-07-15 DIAGNOSIS — Z7982 Long term (current) use of aspirin: Secondary | ICD-10-CM | POA: Diagnosis not present

## 2023-07-15 DIAGNOSIS — Z9221 Personal history of antineoplastic chemotherapy: Secondary | ICD-10-CM | POA: Diagnosis not present

## 2023-07-15 HISTORY — PX: ESOPHAGEAL DILATION: SHX303

## 2023-07-15 HISTORY — PX: ESOPHAGOGASTRODUODENOSCOPY: SHX5428

## 2023-07-15 LAB — GLUCOSE, CAPILLARY: Glucose-Capillary: 109 mg/dL — ABNORMAL HIGH (ref 70–99)

## 2023-07-15 SURGERY — EGD (ESOPHAGOGASTRODUODENOSCOPY)
Anesthesia: General

## 2023-07-15 MED ORDER — PROPOFOL 10 MG/ML IV BOLUS
INTRAVENOUS | Status: DC | PRN
Start: 2023-07-15 — End: 2023-07-15
  Administered 2023-07-15: 80 mg via INTRAVENOUS
  Administered 2023-07-15 (×2): 20 mg via INTRAVENOUS

## 2023-07-15 MED ORDER — LIDOCAINE HCL (CARDIAC) PF 100 MG/5ML IV SOSY
PREFILLED_SYRINGE | INTRAVENOUS | Status: DC | PRN
  Administered 2023-07-15: 80 mg via INTRAVENOUS

## 2023-07-15 MED ORDER — LACTATED RINGERS IV SOLN: INTRAVENOUS | Status: DC | PRN

## 2023-07-15 MED ORDER — EPHEDRINE SULFATE-NACL 50-0.9 MG/10ML-% IV SOSY
PREFILLED_SYRINGE | INTRAVENOUS | Status: DC | PRN
  Administered 2023-07-15 (×2): 5 mg via INTRAVENOUS

## 2023-07-15 MED ORDER — PHENYLEPHRINE 80 MCG/ML (10ML) SYRINGE FOR IV PUSH (FOR BLOOD PRESSURE SUPPORT)
PREFILLED_SYRINGE | INTRAVENOUS | Status: DC | PRN
  Administered 2023-07-15: 160 ug via INTRAVENOUS
  Administered 2023-07-15: 80 ug via INTRAVENOUS
  Administered 2023-07-15: 160 ug via INTRAVENOUS

## 2023-07-15 NOTE — Anesthesia Postprocedure Evaluation (Signed)
 Anesthesia Post Note  Patient: Bethany Wang  Procedure(s) Performed: EGD (ESOPHAGOGASTRODUODENOSCOPY) DILATION, ESOPHAGUS  Patient location during evaluation: PACU Anesthesia Type: General Level of consciousness: awake and alert Pain management: pain level controlled Vital Signs Assessment: post-procedure vital signs reviewed and stable Respiratory status: spontaneous breathing, nonlabored ventilation, respiratory function stable and patient connected to nasal cannula oxygen Cardiovascular status: blood pressure returned to baseline and stable Postop Assessment: no apparent nausea or vomiting Anesthetic complications: no   There were no known notable events for this encounter.   Last Vitals:  Vitals:   07/15/23 0810 07/15/23 0826  BP: 97/62   Pulse:    Resp: (!) 23   Temp:  36.5 C  SpO2: 98%     Last Pain:  Vitals:   07/15/23 0802  TempSrc: Oral  PainSc: 0-No pain                 Jearlene Bridwell L Verley Pariseau

## 2023-07-15 NOTE — H&P (Signed)
 Bethany Wang is an 58 y.o. female.   Chief Complaint: dysphagia HPI: Bethany Wang is a 58 y.o. female with past medical history of breast cancer status post chemoradiation, coronary artery disease status post stent placement, brain tumor, depression, diabetes, hypertension, hyperlipidemia, stroke, seizures, MI with cardiac stents  coming with dysphagia.  Has dysphagia with pills and with bread  No heartburn or odynophagia. The patient denies having any nausea, vomiting, fever, chills, hematochezia, melena, hematemesis, abdominal distention, abdominal pain, diarrhea, jaundice, pruritus or weight loss.   Past Medical History:  Diagnosis Date   Anemia    Anxiety    Bilateral breast cysts 12/30/2015   Brain tumor (HCC)    Breast cancer (HCC)    Left Breast Cancer   Breast disorder    BV (bacterial vaginosis) 09/09/2015   Coronary atherosclerosis of native coronary artery    a. s/p DES to RCA in 2013 b. DES to LAD in 2014 c. cath in 04/2016 showing patent LAD stent with D2 jailed by LAD stent and CTO of RCA with left to right collaterals present   Cyst of pharynx or nasopharynx    [name]   Decreased libido 12/25/2018   Depression    Diabetes mellitus without complication (HCC)    Essential hypertension    Falls    x 3-4 in past year   GERD (gastroesophageal reflux disease)    Headache    Heart attack (HCC)    x4   History of hematuria    Mixed hyperlipidemia    Personal history of chemotherapy    Left Breast Cancer   Personal history of radiation therapy    Left Breast Cancer   Pre-diabetes    PUD (peptic ulcer disease)    Seizures (HCC)    Shingles 09/09/2015   Stroke (HCC)    12-2017 on Plavix, only deficit is headaches   Suicide attempt (HCC)    3 attempts in remote past   Trichimoniasis 08/20/2019   Treated 08/20/19    Uterine cancer (HCC)    Vitamin D deficiency disease 04/03/2019    Past Surgical History:  Procedure Laterality Date    ABDOMINAL HYSTERECTOMY     BIOPSY  11/28/2019   Procedure: BIOPSY;  Surgeon: Malissa Hippo, MD;  [facility];  Service: Endoscopy;;  antral, gastric body    BIOPSY  08/18/2022   Procedure: BIOPSY;  Surgeon: Dolores Frame, MD;  [facility]: [facility];  Service: Gastroenterology;;   BLADDER SURGERY     BREAST LUMPECTOMY Left    BREAST LUMPECTOMY Right 02/24/2018   Procedure: RIGHT BREAST LUMPECTOMY ERAS PATHWAY;  Surgeon: Griselda Miner, MD;  Location: [facility] OR;  Service: General;  Laterality: Right;   CARDIAC CATHETERIZATION N/A 05/10/2016   Procedure: Left Heart Cath and Coronary Angiography;  Surgeon: Lyn Records, MD;  Location: [facility] INVASIVE CV LAB;  Service: Cardiovascular;  Laterality: N/A;   CAUTERIZE INNER NOSE  07/13/2020   COLONOSCOPY  May 2010   Fleishman: normal rectum, internal hemorrhoids, , benign colonic polyp   COLONOSCOPY N/A 01/05/2017   Procedure: COLONOSCOPY;  Surgeon: Malissa Hippo, MD;  [facility]: [facility];  Service: Endoscopy;  Laterality: N/A;  1:00   CORONARY PRESSURE/FFR STUDY N/A 10/23/2020   Procedure: INTRAVASCULAR PRESSURE WIRE/FFR STUDY;  Surgeon: Lyn Records, MD;  Location: [facility] INVASIVE CV LAB;  Service: Cardiovascular;  Laterality: N/A;   CORONARY PRESSURE/FFR STUDY N/A 03/30/2021   Procedure: INTRAVASCULAR PRESSURE WIRE/FFR STUDY;  Surgeon: Yvonne Kendall, MD;  Location: [facility] INVASIVE CV LAB;  Service: Cardiovascular;  Laterality: N/A;   ESOPHAGEAL DILATION  11/28/2019   Procedure: ESOPHAGEAL DILATION;  Surgeon: Malissa Hippo, MD;  [facility];  Service: Endoscopy;;   ESOPHAGOGASTRODUODENOSCOPY     2001 Dr. Madilyn Fireman: distal esophagitis, small hiatal hernia,. Dr. Katrinka Blazing 2006? no records available currently, pt also reports  EGD a few years ago with Dr. Jena Gauss, do not have these reports anywhere in medical records   ESOPHAGOGASTRODUODENOSCOPY  06/02/2011   ZOX:[id number] pill impaction as described above s/p dilation of a  probable cervical esophageal web/bx abnormal esophageal and gastric mucosa. + H.pylori gastritis    ESOPHAGOGASTRODUODENOSCOPY (EGD) WITH PROPOFOL N/A 11/28/2019   Procedure: ESOPHAGOGASTRODUODENOSCOPY (EGD) WITH PROPOFOL;  Surgeon: Malissa Hippo, MD;  [facility];  Service: Endoscopy;  Laterality: N/A;  1020   ESOPHAGOGASTRODUODENOSCOPY (EGD) WITH PROPOFOL N/A 08/18/2022   Procedure: ESOPHAGOGASTRODUODENOSCOPY (EGD) WITH PROPOFOL;  Surgeon: Dolores Frame, MD;  [facility];  Service: Gastroenterology;  Laterality: N/A;  8:30AM;ASA 1   EYE SURGERY     Removed glass   HAND SURGERY Right    Left breast lumpectomy     Benign   LEFT HEART CATH AND CORONARY ANGIOGRAPHY N/A 10/25/2018   Procedure: LEFT HEART CATH AND CORONARY ANGIOGRAPHY;  Surgeon: Iran Ouch, MD;  Location: [facility];  Service: Cardiovascular;  Laterality: N/A;   LEFT HEART CATH AND CORONARY ANGIOGRAPHY N/A 10/23/2020   Procedure: LEFT HEART CATH AND CORONARY ANGIOGRAPHY;  Surgeon: Lyn Records, MD;  Location: [facility];  Service: Cardiovascular;  Laterality: N/A;   LEFT HEART CATH AND CORONARY ANGIOGRAPHY N/A 03/30/2021   Procedure: LEFT HEART CATH AND CORONARY ANGIOGRAPHY;  Surgeon: Yvonne Kendall, MD;  Location: [facility];  Service: Cardiovascular;  Laterality: N/A;   LEFT HEART CATH AND CORONARY ANGIOGRAPHY N/A 01/22/2022   Procedure: LEFT HEART CATH AND CORONARY ANGIOGRAPHY;  Surgeon: Tonny Bollman, MD;  Location[facility] INVASIVE CV LAB;  Service: Cardiovascular;  Laterality: N/A;   LEFT HEART CATHETERIZATION WITH CORONARY ANGIOGRAM N/A 07/03/2014   Procedure: LEFT HEART CATHETERIZATION WITH CORONARY ANGIOGRAM;  Surgeon: Lyn Records, MD;  Location: [facility] CATH LAB;  Service: Cardiovascular;  Laterality: N/A;   POLYPECTOMY  01/05/2017   Procedure: POLYPECTOMY;  Surgeon: Malissa Hippo, MD;  [facility];  Service: Endoscopy;;  colon   RIGHT/LEFT HEART  CATH AND CORONARY ANGIOGRAPHY N/A 11/03/2022   Procedure: RIGHT/LEFT HEART CATH AND CORONARY ANGIOGRAPHY;  Surgeon: Kathleene Hazel, MD;  Location: [facility];  Service: Cardiovascular;  Laterality: N/A;   SHOULDER SURGERY Left     Family History  Problem Relation Age of Onset   Colon cancer Paternal Grandfather    Cancer Father    Cancer Maternal Uncle    Alzheimer's disease Paternal Aunt    Cirrhosis Maternal Uncle    Cancer Paternal Aunt        lung   Aneurysm Mother        brain   Heart disease Brother    Heart attack Brother    Mental illness Daughter    ADD / ADHD Daughter    Bipolar disorder Daughter    Mental illness Son    ADD / ADHD Son    Bipolar disorder Son    Mental illness Son    ADD / ADHD Son    Bipolar disorder Son    Social  History:  reports that she quit smoking about 11 years ago. Her smoking use included cigarettes and cigars. She started smoking about 40 years ago. She has a 6.4 pack-year smoking history. She has never used smokeless tobacco. She reports that she does not drink alcohol and does not use drugs.  Allergies:  Allergies  Allergen Reactions   Bee Venom Anaphylaxis and Other (See Comments)    "throat swelled and had to the hospital" - Yellow jacket sting   Lipitor [Atorvastatin] Rash and Other (See Comments)    Liver damage & Upper Abdominal pain, Break out like a rash . Per last hosp visit. It exposed on inpatient stay.   Penicillins Anaphylaxis   Shellfish Allergy Swelling   Spironolactone Other (See Comments)    Nausea, dehydration   Tomato Itching and Swelling   Valsartan Itching   Zofran [Ondansetron] Itching   Crestor [Rosuvastatin] Rash and Other (See Comments)    Upper Abdominal pain   Ranolazine Rash    Medications Prior to Admission  Medication Sig Dispense Refill   ALPRAZolam (XANAX) 0.5 MG tablet Take 1 tablet (0.5 mg total) by mouth 2 (two) times daily as needed for anxiety. (Patient taking differently:  Take 0.5 mg by mouth daily as needed for anxiety.) 30 tablet 0   aspirin EC 81 MG tablet Take 1 tablet (81 mg total) by mouth daily. Swallow whole. 90 tablet 3   carvedilol (COREG) 12.5 MG tablet Take 1 tablet (12.5 mg total) by mouth 2 (two) times daily. 180 tablet 3   Cholecalciferol (DIALYVITE VITAMIN D 5000) 125 MCG (5000 UT) capsule Take 5,000 Units by mouth in the morning.     escitalopram (LEXAPRO) 20 MG tablet Take 1 tablet (20 mg total) by mouth daily. 30 tablet 3   fluorometholone (FML) 0.1 % ophthalmic suspension Place 1 drop into both eyes daily.     fluticasone-salmeterol (ADVAIR) 100-50 MCG/ACT AEPB INHALE 1 PUFF BY MOUTH TWICE DAILY (Patient taking differently: Inhale 1 puff into the lungs as needed (wheezing/SOB).) 60 each 10   gabapentin (NEURONTIN) 100 MG capsule Take 1 capsule (100 mg total) by mouth 3 (three) times daily. 30 capsule 0   isosorbide mononitrate (IMDUR) 30 MG 24 hr tablet Take 1 tablet (30 mg total) by mouth daily. 90 tablet 3   linaclotide (LINZESS) 145 MCG CAPS capsule Take 145 mcg by mouth daily before breakfast.     NIFEdipine (PROCARDIA XL/NIFEDICAL XL) 60 MG 24 hr tablet Take 1 tablet (60 mg total) by mouth daily. 30 tablet 3   NP THYROID 60 MG tablet Take 60 mg by mouth every morning.     pantoprazole (PROTONIX) 40 MG tablet Take 1 tablet (40 mg total) by mouth daily. 60 tablet 1   RESTASIS 0.05 % ophthalmic emulsion Place 2 drops into both eyes as needed (dry eye).     traZODone (DESYREL) 50 MG tablet Take 50 mg by mouth at bedtime as needed for sleep.     albuterol (VENTOLIN HFA) 108 (90 Base) MCG/ACT inhaler Inhale 2 puffs into the lungs every 4 (four) hours as needed for wheezing or shortness of breath. (Patient taking differently: Inhale 2 puffs into the lungs as needed for wheezing or shortness of breath.) 1 each 3   clopidogrel (PLAVIX) 75 MG tablet Take 1 tablet (75 mg total) by mouth daily. 90 tablet 3   Evolocumab (REPATHA SURECLICK [place]) Inject 140  mg into the skin every 14 (fourteen) days.     nitroGLYCERIN (NITROSTAT) 0.4  MG SL tablet DISSOLVE 1 TABLET UNDER THE TONGUE AS NEEDED FOR CHEST PAIN EVERY 5 MINUTES UP TO 3 TIMES. IF NO RELIEF CALL 911. (Patient taking differently: Place 0.4 mg under the tongue every 5 (five) minutes as needed for chest pain.) 25 tablet 1   prochlorperazine (COMPAZINE) 10 MG tablet Take 1 tablet (10 mg total) by mouth every 6 (six) hours as needed for nausea or vomiting. 90 tablet 1    Results for orders placed or performed during the hospital encounter of 07/15/23 (from the past 48 hours)  Glucose, capillary     Status: Abnormal   Collection Time: 07/15/23  6:56 AM  Result Value Ref Range   Glucose-Capillary 109 (H) 70 - 99 mg/dL    Comment: Glucose reference range applies only to samples taken after fasting for at least 8 hours.   No results found.  Review of Systems  HENT:  Positive for trouble swallowing.   All other systems reviewed and are negative.   Blood pressure 103/70, pulse (!) 57, temperature 97.8 F (36.6 C), temperature source Oral, resp. rate 16, height 5\' 4"  (1.626 m), weight 68.9 kg, SpO2 99%. Physical Exam  GENERAL: The patient is AO x3, in no acute distress. HEENT: Head is normocephalic and atraumatic. EOMI are intact. Mouth is well hydrated and without lesions. NECK: Supple. No masses LUNGS: Clear to auscultation. No presence of rhonchi/wheezing/rales. Adequate chest expansion HEART: RRR, normal s1 and s2. ABDOMEN: Soft, nontender, no guarding, no peritoneal signs, and nondistended. BS +. No masses. EXTREMITIES: Without any cyanosis, clubbing, rash, lesions or edema. NEUROLOGIC: AOx3, no focal motor deficit. SKIN: no jaundice, no rashes  Assessment/Plan Bethany Wang is a 58 y.o. female with past medical history of breast cancer status post chemoradiation, coronary artery disease status post stent placement, brain tumor, depression, diabetes, hypertension, hyperlipidemia,  stroke, seizures, MI with cardiac stents  coming with dysphagia. Will proceed with Egd.  Dolores Frame, MD 07/15/2023, 7:33 AM

## 2023-07-15 NOTE — Discharge Instructions (Signed)
You are being discharged to home.  ?Resume your previous diet.  ?We are waiting for your pathology results.  ?Continue your present medications.  ?

## 2023-07-15 NOTE — Op Note (Signed)
 [facility] Patient Name: Bethany Wang Procedure Date: 07/15/2023 7:11 AM MRN: [id number] Date of Birth: November 07, 1965 Attending MD: Katrinka Blazing , , [id number] CSN: [id number] Age: 58 Admit Type: Outpatient Procedure:                Upper GI endoscopy Indications:              Dysphagia Providers:                Katrinka Blazing, Sheran Fava, Cyril Mourning, Technician Referring MD:              Medicines:                Monitored Anesthesia Care Complications:            No immediate complications. Estimated Blood Loss:     Estimated blood loss: none. Procedure:                Pre-Anesthesia Assessment:                           - Prior to the procedure, a History and Physical                            was performed, and patient medications, allergies                            and sensitivities were reviewed. The patient's                            tolerance of previous anesthesia was reviewed.                           - The risks and benefits of the procedure and the                            sedation options and risks were discussed with the                            patient. All questions were answered and informed                            consent was obtained.                           - ASA Grade Assessment: III - A patient with severe                            systemic disease.                           After obtaining informed consent, the endoscope was                            passed under direct vision. Throughout the  procedure, the patient's blood pressure, pulse, and                            oxygen saturations were monitored continuously. The                            GIF-H190 ([id number]) scope was introduced through the                            mouth, and advanced to the second part of duodenum.                            The upper GI endoscopy was accomplished without                             difficulty. The patient tolerated the procedure                            well. Scope In: 7:48:00 AM Scope Out: 7:56:39 AM Total Procedure Duration: 0 hours 8 minutes 39 seconds  Findings:      No endoscopic abnormality was evident in the esophagus to explain the       patient's complaint of dysphagia. It was decided, however, to proceed       with dilation of the entire esophagus. A guidewire was placed and the       scope was withdrawn. Dilation was performed with a Savary dilator with       no resistance at 18 mm. The dilation site was examined following       endoscope reinsertion and showed mild mucosal disruption. Biopsies were       obtained from the proximal and distal esophagus with cold forceps for       histology of eosinophilic esophagitis.      Three pills were found in the gastric body.      The examined duodenum was normal. Impression:               - No endoscopic esophageal abnormality to explain                            patient's dysphagia. Esophagus dilated. Dilated.                           - Three pills were found in the stomach.                           - Normal examined duodenum.                           - Biopsies were taken with a cold forceps for                            evaluation of eosinophilic esophagitis. Moderate Sedation:      Per Anesthesia Care Recommendation:           - Discharge patient to home (ambulatory).                           -  Resume previous diet.                           - Await pathology results.                           - Continue present medications. Procedure Code(s):        --- Professional ---                           [phone], Esophagogastroduodenoscopy, flexible,                            transoral; with insertion of guide wire followed by                            passage of dilator(s) through esophagus over guide                            wire                           [id number], 59,  Esophagogastroduodenoscopy, flexible,                            transoral; with biopsy, single or multiple Diagnosis Code(s):        --- Professional ---                           R13.10, Dysphagia, unspecified                           T18.2XXA, Foreign body in stomach, initial encounter CPT copyright 2022 [employer]. All rights reserved. The codes documented in this report are preliminary and upon coder review may  be revised to meet current compliance requirements. Katrinka Blazing, MD Katrinka Blazing,  07/15/2023 8:00:57 AM This report has been signed electronically. Number of Addenda: 0

## 2023-07-15 NOTE — Transfer of Care (Signed)
 Immediate Anesthesia Transfer of Care Note  Patient: Bethany Wang  Procedure(s) Performed: EGD (ESOPHAGOGASTRODUODENOSCOPY) DILATION, ESOPHAGUS  Patient Location: PACU  Anesthesia Type:General  Level of Consciousness: drowsy  Airway & Oxygen Therapy: Patient connected to nasal cannula oxygen  Post-op Assessment: Report given to RN and Post -op Vital signs reviewed and stable  Post vital signs: Reviewed and stable  Last Vitals:  Vitals Value Taken Time  BP 90/47 07/15/23 0802  Temp    Pulse 53 07/15/23 0802  Resp 20 07/15/23 0802  SpO2 100 % 07/15/23 0802    Last Pain:  Vitals:   07/15/23 0802  TempSrc: Oral  PainSc: 0-No pain         Complications: No notable events documented.

## 2023-07-18 ENCOUNTER — Encounter (HOSPITAL_COMMUNITY): Payer: Self-pay | Admitting: Gastroenterology

## 2023-07-18 ENCOUNTER — Encounter (INDEPENDENT_AMBULATORY_CARE_PROVIDER_SITE_OTHER): Payer: Self-pay | Admitting: *Deleted

## 2023-07-18 LAB — SURGICAL PATHOLOGY

## 2023-07-21 ENCOUNTER — Encounter (HOSPITAL_BASED_OUTPATIENT_CLINIC_OR_DEPARTMENT_OTHER): Payer: 59 | Admitting: Family

## 2023-08-06 DIAGNOSIS — R296 Repeated falls: Secondary | ICD-10-CM | POA: Diagnosis not present

## 2023-08-06 DIAGNOSIS — M1611 Unilateral primary osteoarthritis, right hip: Secondary | ICD-10-CM | POA: Diagnosis not present

## 2023-08-06 DIAGNOSIS — S79811A Other specified injuries of right hip, initial encounter: Secondary | ICD-10-CM | POA: Diagnosis not present

## 2023-08-06 DIAGNOSIS — M25551 Pain in right hip: Secondary | ICD-10-CM | POA: Diagnosis not present

## 2023-08-06 DIAGNOSIS — I10 Essential (primary) hypertension: Secondary | ICD-10-CM | POA: Diagnosis not present

## 2023-08-06 DIAGNOSIS — M25561 Pain in right knee: Secondary | ICD-10-CM | POA: Diagnosis not present

## 2023-08-06 DIAGNOSIS — Z87891 Personal history of nicotine dependence: Secondary | ICD-10-CM | POA: Diagnosis not present

## 2023-08-06 DIAGNOSIS — S8981XA Other specified injuries of right lower leg, initial encounter: Secondary | ICD-10-CM | POA: Diagnosis not present

## 2023-08-10 ENCOUNTER — Encounter: Payer: Self-pay | Admitting: Internal Medicine

## 2023-08-10 ENCOUNTER — Ambulatory Visit: Payer: 59 | Attending: Internal Medicine | Admitting: Internal Medicine

## 2023-08-10 VITALS — BP 190/104 | HR 63 | Ht 64.0 in | Wt 149.6 lb

## 2023-08-10 DIAGNOSIS — I160 Hypertensive urgency: Secondary | ICD-10-CM | POA: Diagnosis not present

## 2023-08-10 DIAGNOSIS — K581 Irritable bowel syndrome with constipation: Secondary | ICD-10-CM

## 2023-08-10 DIAGNOSIS — I25119 Atherosclerotic heart disease of native coronary artery with unspecified angina pectoris: Secondary | ICD-10-CM

## 2023-08-10 NOTE — Patient Instructions (Addendum)
 Medication Instructions:  NO CHANGES TODAY -- will depend on BP log  *If you need a refill on your cardiac medications before your next appointment, please call your pharmacy*  Follow-Up: At [facility], you and your health needs are our priority.  As part of our continuing mission to provide you with exceptional heart care, our providers are all part of one team.  This team includes your primary Cardiologist (physician) and Advanced Practice Providers or APPs (Physician Assistants and Nurse Practitioners) who all work together to provide you with the care you need, when you need it.  Your next appointment:    6 months with Katlyn West, NP  We recommend signing up for the patient portal called "MyChart".  Sign up information is provided on this After Visit Summary.  MyChart is used to connect with patients for Virtual Visits (Telemedicine).  Patients are able to view lab/test results, encounter notes, upcoming appointments, etc.  Non-urgent messages can be sent to your provider as well.   To learn more about what you can do with MyChart, go to [email or web address].   Other Instructions  Check BP in the morning for about 1 week and then call or sent readings via MyChart.

## 2023-08-10 NOTE — Progress Notes (Signed)
 Office Note  Chief Complaint:  Follow-up  Primary Care Physician: Lauran Pollard, MD  Primary Cardiologist:  Hazle Lites, MD  HPI:  Bethany Wang is a 58 y.o. female who is being seen today for the evaluation of dyslipidemia at the request of Lauran Pollard, MD. This is a pleasant 58 year old female that I do not recall seeing in the hospital but she remembers seeing me with a recent hospitalization.  She was hospitalized in July with chest pain and underwent left heart catheterization.  This demonstrated occlusion of the right coronary artery with the previously placed coronary stent and left to right collaterals.  There was a 50% in-stent restenosis of the proximal LAD and 90% diagonal ostial narrowing.  There is also 60 to 70% obtuse marginal narrowing and 50% distal circumflex disease.  Medical therapy was recommended.  She was referred today for management of dyslipidemia.  Unfortunately she was intolerant of statins including atorvastatin  and rosuvastatin, both of which caused significant side effects.  Subsequently she had been switched by her primary care provider to Repatha and has already had a dose of the medication.  It is too early to look for response to this medication however I agree with this therapy.  More concerning, however today is symptoms of progressive chest pain.  She reports that ever since this past summer she has had worsening chest pain despite long-acting nitroglycerin .  She has been using short acting nitroglycerin  very frequently and gets some minimal relief however it is short-lived.  EKG today shows normal sinus rhythm with some nonspecific T wave changes.  06/08/2021  Bethany Wang returns today for recurrent chest pain.  She has been in the emergency department twice this year including the other week with chest pain.  She said she had an episode when she was at church including diaphoresis, shortness of breath and left arm pain.  She took  nitroglycerin  and it took several minutes to up to an hour to improve.  She also notes she gets short of breath and has chest discomfort when exerting herself.  She says she gets chest discomfort working in her garden.  I had sent her for cardiac catheterization in December 2022 for symptoms concerning for unstable angina.  Demonstrated moderate to severe multivessel coronary disease, similar to prior cath in July 2022 however FFR was negative of the mid LAD stent in OM1 lesion and a chronic total occlusion of the proximal RCA stent was again noted.  LVEDP was mildly elevated.  More antianginal therapy was recommended.  Initially she was switched from isosorbide  30 mg 3 times daily to 60 mg twice daily.  She says initially this helped somewhat but since then her symptoms have worsened.  She is now taking sublingual nitro almos  09/04/2021  Bethany Wang is seen today in follow-up.  She reports marked improvement in her symptoms.  Blood pressure is now much better controlled at 124/76.  It turns out she had a rash that was attributable to the Ranexa .  She stopped that.  She had been switched to Repatha which she is tolerating very well and has had a significant improvement in her cholesterol.  In February showed total cholesterol 123 (down from 216), triglycerides 72, HDL 52 and LDL 57.  Overall she is very pleased with how she is feeling.  She is still struggling with bilateral hip pain and has been working with Dr. Ayesha Lente who is contemplating sequential, bilateral hip replacement.  01/20/2022  [name] was an acute add-on visit today.  Apparently she was seen this morning while doing rehab by her primary care physician who is next-door.  She was noted to be tachycardic and in talking with her PCP he felt that she needed to be in the hospital.  Her blood pressure was quite elevated.  This had upset her.  She contacted us  and was able to get in to an appointment today.  In speaking with her, she says that  she has had significant increase in the need for nitroglycerin  over the past several weeks.  In fact she is taking it about every other day in addition to her long-acting nitrates.  Her last heart catheterization in 2022 did show moderate to severe stenosis of the LAD (in-stent), and there is chronic total occlusion of the proximal RCA stent with left to right collaterals.  FFR was nonsignificant and no intervention was performed.  Increased antianginal therapy was recommended.  Despite this, it seems that her symptoms continue to worsen.  She has had intermittent episodes of tachycardia.  She had monitoring over the summer which showed some episodes of nonsustained VT.  Her beta-blocker was titrated.  10/29/2022  Bethany Wang returns today for follow-up.  She is again experiencing episodes of shortness of breath and chest pressure with exertion.  She has had progressive fatigue and decreased exercise tolerance.  She tells me she had a syncopal episode.  There does not appear to be any warning prior to this.  She said afterwards her daughter took her blood pressure and it measured 60/30.  Eventually she said it resolved.  She says she cannot do any other normal activities that she wanted to do before.  She feels like her heart races often.  She does feel little bit better after going back to metoprolol  twice daily.  EKG today shows normal sinus rhythm and is unchanged compared to her prior EKG in June.  She does have known coronary disease with collaterals and CTO of the RCA.  This was last assessed by cath in 2023 however since then she says she has continued to feel worse.  She was noted to have moderate to severe aortic insufficiency by echo recently with normal LV function.  It may be possible that her symptoms are due to heart failure related to valvular insufficiency.  She is on some chlorthalidone .  Blood pressure is better controlled than it has been in the past.  She also reports a nocturnal cough,  however she has been having GI issues including reflux and associated symptoms.  08/10/2023  Bethany Wang is seen today in follow-up.  She continues to have labile blood pressures.  1 issue may be that she says she has trouble keeping her medicines down.  She says she vomits and or is nauseated throughout the day when she takes her medicines.  She had recent EGD which really did not show any clear reason for this.  She has tried prokinetics apparently before without success.  She has tried antiemetics without any benefit.  I suspect she may not be absorbing medicines as it was noted to have undigested pill fragments in her recent EGD.  She underwent cardiac catheterization last summer which showed no change in her coronary disease but uncontrolled hypertension.  Today she has not taken her medicine this morning but her blood pressure was 190/104.  She says however at home when she takes her medicines that her blood pressure is around 120s to 140s.  She underwent  secondary workup for hypertension including renal Dopplers and evaluation for pheochromocytoma and other rare disorders.  This is all been negative.  PMHx:  Past Medical History:  Diagnosis Date   Anemia    Anxiety    Bilateral breast cysts 12/30/2015   Brain tumor (HCC)    Breast cancer (HCC)    Left Breast Cancer   Breast disorder    BV (bacterial vaginosis) 09/09/2015   Coronary atherosclerosis of native coronary artery    a. s/p DES to RCA in 2013 b. DES to LAD in 2014 c. cath in 04/2016 showing patent LAD stent with D2 jailed by LAD stent and CTO of RCA with left to right collaterals present   Cyst of pharynx or nasopharynx    [name]   Decreased libido 12/25/2018   Depression    Diabetes mellitus without complication (HCC)    Essential hypertension    Falls    x 3-4 in past year   GERD (gastroesophageal reflux disease)    Headache    Heart attack (HCC)    x4   History of hematuria    Mixed  hyperlipidemia    Personal history of chemotherapy    Left Breast Cancer   Personal history of radiation therapy    Left Breast Cancer   Pre-diabetes    PUD (peptic ulcer disease)    Seizures (HCC)    Shingles 09/09/2015   Stroke (HCC)    12-2017 on Plavix , only deficit is headaches   Suicide attempt (HCC)    3 attempts in remote past   Trichimoniasis 08/20/2019   Treated 08/20/19    Uterine cancer (HCC)    Vitamin D  deficiency disease 04/03/2019    Past Surgical History:  Procedure Laterality Date   ABDOMINAL HYSTERECTOMY     BIOPSY  11/28/2019   Procedure: BIOPSY;  Surgeon: Ruby Corporal, MD;  [facility]: [facility];  Service: Endoscopy;;  antral, gastric body    BIOPSY  08/18/2022   Procedure: BIOPSY;  Surgeon: Urban Garden, MD;  [facility]: [facility];  Service: Gastroenterology;;   BLADDER SURGERY     BREAST LUMPECTOMY Left    BREAST LUMPECTOMY Right 02/24/2018   Procedure: RIGHT BREAST LUMPECTOMY ERAS PATHWAY;  Surgeon: Caralyn Chandler, MD;  Location: [facility] OR;  Service: General;  Laterality: Right;   CARDIAC CATHETERIZATION N/A 05/10/2016   Procedure: Left Heart Cath and Coronary Angiography;  Surgeon: Arty Binning, MD;  Location: [facility] INVASIVE CV LAB;  Service: Cardiovascular;  Laterality: N/A;   CAUTERIZE INNER NOSE  07/13/2020   COLONOSCOPY  May 2010   Fleishman: normal rectum, internal hemorrhoids, , benign colonic polyp   COLONOSCOPY N/A 01/05/2017   Procedure: COLONOSCOPY;  Surgeon: Ruby Corporal, MD;  [facility]: [facility];  Service: Endoscopy;  Laterality: N/A;  1:00   CORONARY PRESSURE/FFR STUDY N/A 10/23/2020   Procedure: INTRAVASCULAR PRESSURE WIRE/FFR STUDY;  Surgeon: Arty Binning, MD;  Location: [facility] INVAS[facility] CV LAB;  Service: Cardiovascular;  Laterality: N/A;   CORONARY PRESSURE/FFR STUDY N/A 03/30/2021   Procedure: INTRAVASCULAR PRESSURE WIRE/FFR STUDY;  Surgeon: Sammy Crisp, MD;  Location: [facility]VAS[facility] CV LAB;  Service: Cardiovascular;   Laterality: N/A;   ESOPHAGEAL DILATION  11/28/2019   Procedure: ESOPHAGEAL DILATION;  Surgeon: Ruby Corporal, MD;  [facility];  Service: Endoscopy;;   ESOPHAGEAL DILATION N/A 07/15/2023   Procedure: DILATION, ESOPHAGUS;  Surgeon: Urban Garden, MD;  [facility]: [facility];  Service: Gastroenterology;  Laterality: N/A;  8:15AM;ASA 3   ESOPHAGOGASTRODUODENOSCOPY     2001 Dr. Sabra Cramp: distal esophagitis, small hiatal hernia,. Dr. Felipe Horton 2006? no records available currently, pt also reports  EGD a few years ago with Dr. Riley Cheadle, do not have these reports anywhere in medical records   ESOPHAGOGASTRODUODENOSCOPY  06/02/2011   [id number] pill impaction as described above s/p dilation of a probable cervical esophageal web/bx abnormal esophageal and gastric mucosa. + H.pylori gastritis    ESOPHAGOGASTRODUODENOSCOPY N/A 07/15/2023   Procedure: EGD (ESOPHAGOGASTRODUODENOSCOPY);  Surgeon: Umberto Ganong, Bearl Limes, MD;  [facility]: [facility];  Service: Gastroenterology;  Laterality: N/A;  8:15AM;ASA 3   ESOPHAGOGASTRODUODENOSCOPY (EGD) WITH PROPOFOL  N/A 11/28/2019   Procedure: ESOPHAGOGASTRODUODENOSCOPY (EGD) WITH PROPOFOL ;  Surgeon: Ruby Corporal, MD;  [facility];  Service: Endoscopy;  Laterality: N/A;  1020   ESOPHAGOGASTRODUODENOSCOPY (EGD) WITH PROPOFOL  N/A 08/18/2022   Procedure: ESOPHAGOGASTRODUODENOSCOPY (EGD) WITH PROPOFOL ;  Surgeon: Urban Garden, MD;  [facility];  Service: Gastroenterology;  Laterality: N/A;  8:30AM;ASA 1   EYE SURGERY     Removed glass   HAND SURGERY Right    Left breast lumpectomy     Benign   LEFT HEART CATH AND CORONARY ANGIOGRAPHY N/A 10/25/2018   Procedure: LEFT HEART CATH AND CORONARY ANGIOGRAPHY;  Surgeon: Wenona Hamilton, MD;  Location: [facility];  Service: Cardiovascular;  Laterality: N/A;   LEFT HEART CATH AND CORONARY ANGIOGRAPHY N/A 10/23/2020   Procedure: LEFT HEART CATH AND CORONARY ANGIOGRAPHY;   Surgeon: Arty Binning, MD;  Location: [facility];  Service: Cardiovascular;  Laterality: N/A;   LEFT HEART CATH AND CORONARY ANGIOGRAPHY N/A 03/30/2021   Procedure: LEFT HEART CATH AND CORONARY ANGIOGRAPHY;  Surgeon: Sammy Crisp, MD;  Location: [facility];  Service: Cardiovascular;  Laterality: N/A;   LEFT HEART CATH AND CORONARY ANGIOGRAPHY N/A 01/22/2022   Procedure: LEFT HEART CATH AND CORONARY ANGIOGRAPHY;  Surgeon: Arnoldo Lapping, MD;  Location: [facility] INVASIVE CV LAB;  Service: Cardiovascular;  Laterality: N/A;   LEFT HEART CATHETERIZATION WITH CORONARY ANGIOGRAM N/A 07/03/2014   Procedure: LEFT HEART CATHETERIZATION WITH CORONARY ANGIOGRAM;  Surgeon: Arty Binning, MD;  Location: [facility] CATH LAB;  Service: Cardiovascular;  Laterality: N/A;   POLYPECTOMY  01/05/2017   Procedure: POLYPECTOMY;  Surgeon: Ruby Corporal, MD;  [facility];  Service: Endoscopy;;  colon   RIGHT/LEFT HEART CATH AND CORONARY ANGIOGRAPHY N/A 11/03/2022   Procedure: RIGHT/LEFT HEART CATH AND CORONARY ANGIOGRAPHY;  Surgeon: Odie Benne, MD;  Location: [facility];  Service: Cardiovascular;  Laterality: N/A;   SHOULDER SURGERY Left     FAMHx:  Family History  Problem Relation Age of Onset   Colon cancer Paternal Grandfather    Cancer Father    Cancer Maternal Uncle    Alzheimer's disease Paternal Aunt    Cirrhosis Maternal Uncle    Cancer Paternal Aunt        lung   Aneurysm Mother        brain   Heart disease Brother    Heart attack Brother    Mental illness Daughter    ADD / ADHD Daughter    Bipolar disorder Daughter    Mental illness Son    ADD / ADHD Son    Bipolar disorder Son    Mental illness Son    ADD / ADHD Son    Bipolar disorder Son     SOCHx:   reports that  she quit smoking about 11 years ago. Her smoking use included cigarettes and cigars. She started smoking about 40 years ago. She has a 6.4 pack-year smoking history. She has never used  smokeless tobacco. She reports that she does not drink alcohol and does not use drugs.  ALLERGIES:  Allergies  Allergen Reactions   Bee Venom Anaphylaxis and Other (See Comments)    "throat swelled and had to the hospital" - Yellow jacket sting   Lipitor [Atorvastatin ] Rash and Other (See Comments)    Liver damage & Upper Abdominal pain, Break out like a rash . Per last hosp visit. It exposed on inpatient stay.   Penicillins Anaphylaxis   Shellfish Allergy Swelling   Spironolactone  Other (See Comments)    Nausea, dehydration   Tomato Itching and Swelling   Valsartan  Itching   Zofran  [Ondansetron ] Itching   Crestor [Rosuvastatin] Rash and Other (See Comments)    Upper Abdominal pain   Ranolazine  Rash    ROS: Pertinent items noted in HPI and remainder of comprehensive ROS otherwise negative.  HOME MEDS: Current Outpatient Medications on File Prior to Visit  Medication Sig Dispense Refill   albuterol  (VENTOLIN  HFA) 108 (90 Base) MCG/ACT inhaler Inhale 2 puffs into the lungs every 4 (four) hours as needed for wheezing or shortness of breath. (Patient taking differently: Inhale 2 puffs into the lungs as needed for wheezing or shortness of breath.) 1 each 3   ALPRAZolam  (XANAX ) 0.5 MG tablet Take 1 tablet (0.5 mg total) by mouth 2 (two) times daily as needed for anxiety. (Patient taking differently: Take 0.5 mg by mouth daily as needed for anxiety.) 30 tablet 0   aspirin  EC 81 MG tablet Take 1 tablet (81 mg total) by mouth daily. Swallow whole. 90 tablet 3   Cholecalciferol (DIALYVITE VITAMIN D  5000) 125 MCG (5000 UT) capsule Take 5,000 Units by mouth in the morning.     clopidogrel  (PLAVIX ) 75 MG tablet Take 1 tablet (75 mg total) by mouth daily. 90 tablet 3   escitalopram  (LEXAPRO ) 20 MG tablet Take 1 tablet (20 mg total) by mouth daily. 30 tablet 3   Evolocumab (REPATHA SURECLICK [place]) Inject 140 mg into the skin every 14 (fourteen) days.     fluorometholone (FML) 0.1 % ophthalmic  suspension Place 1 drop into both eyes daily.     fluticasone -salmeterol (ADVAIR) 100-50 MCG/ACT AEPB INHALE 1 PUFF BY MOUTH TWICE DAILY (Patient taking differently: Inhale 1 puff into the lungs as needed (wheezing/SOB).) 60 each 10   gabapentin  (NEURONTIN ) 100 MG capsule Take 1 capsule (100 mg total) by mouth 3 (three) times daily. 30 capsule 0   isosorbide  mononitrate (IMDUR ) 30 MG 24 hr tablet Take 1 tablet (30 mg total) by mouth daily. 90 tablet 3   linaclotide  (LINZESS ) 145 MCG CAPS capsule Take 145 mcg by mouth daily before breakfast.     NIFEdipine  (PROCARDIA  XL/NIFEDICAL XL) 60 MG 24 hr tablet Take 1 tablet (60 mg total) by mouth daily. 30 tablet 3   nitroGLYCERIN  (NITROSTAT ) 0.4 MG SL tablet DISSOLVE 1 TABLET UNDER THE TONGUE AS NEEDED FOR CHEST PAIN EVERY 5 MINUTES UP TO 3 TIMES. IF NO RELIEF CALL 911. (Patient taking differently: Place 0.4 mg under the tongue every 5 (five) minutes as needed for chest pain.) 25 tablet 1   NP THYROID  60 MG tablet Take 60 mg by mouth every morning.     pantoprazole  (PROTONIX ) 40 MG tablet Take 1 tablet (40 mg total) by mouth daily. 60  tablet 1   prochlorperazine  (COMPAZINE ) 10 MG tablet Take 1 tablet (10 mg total) by mouth every 6 (six) hours as needed for nausea or vomiting. 90 tablet 1   RESTASIS 0.05 % ophthalmic emulsion Place 2 drops into both eyes as needed (dry eye).     traZODone (DESYREL) 50 MG tablet Take 50 mg by mouth at bedtime as needed for sleep.     carvedilol  (COREG ) 12.5 MG tablet Take 1 tablet (12.5 mg total) by mouth 2 (two) times daily. 180 tablet 3   No current facility-administered medications on file prior to visit.    LABS/IMAGING: No results found for this or any previous visit (from the past 48 hours). No results found.  LIPID PANEL:    Component Value Date/Time   CHOL 127 11/01/2022 1003   TRIG 62 11/01/2022 1003   HDL 54 11/01/2022 1003   CHOLHDL 2.4 11/01/2022 1003   VLDL 12 11/01/2022 1003   LDLCALC 61 11/01/2022  1003   LDLCALC 146 (H) 07/03/2020 1334    WEIGHTS: Wt Readings from Last 3 Encounters:  08/10/23 149 lb 9.6 oz (67.9 kg)  07/15/23 152 lb (68.9 kg)  07/12/23 153 lb (69.4 kg)    VITALS: BP (!) 190/104 (BP Location: Left Arm, Patient Position: Sitting, Cuff Size: Normal)   Pulse 63   Ht 5\' 4"  (1.626 m)   Wt 149 lb 9.6 oz (67.9 kg)   SpO2 97%   BMI 25.68 kg/m   EXAM: General appearance: alert and no distress Neck: no carotid bruit, no JVD, and thyroid  not enlarged, symmetric, no tenderness/mass/nodules Lungs: clear to auscultation bilaterally Heart: regular rate and rhythm, S1, S2 normal, and diastolic murmur: mid diastolic 2/6, blowing at apex Abdomen: soft, non-tender; bowel sounds normal; no masses,  no organomegaly Extremities: extremities normal, atraumatic, no cyanosis or edema Pulses: 2+ and symmetric Skin: Skin color, texture, turgor normal. No rashes or lesions Neurologic: Alert and oriented X 3, normal strength and tone. Normal symmetric reflexes. Normal coordination and gait Psych: Pleasant  EKG: Deferred  ASSESSMENT: Chest pain -possibly stable angina Progressive DOE/Fatigue Palpitations/SVT/syncope Coronary artery disease with prior PCI and occluded right coronary with left-to-right collaterals, 50% diffuse LAD stenosis (10/2020) -repeat cardiac catheterization 01/2022, unchanged, medical therapy recommended Mixed dyslipidemia, goal LDL less than 70 Statin allergic - hives, now on Repatha Hypertension Moderate to severe AI (present since at least 2020 by echo) Digestive issues with nausea and vomiting  PLAN: 1.   Ms. Wang continues to have issues with nausea and vomiting and may not be digesting her pills well which could contribute to poorly controlled hypertension.  Although she says that on her medicines which she is taking 3 times a day blood pressure at home seems to be better controlled.  Today it was very high although she was due for her medicines  about 30 minutes after our visit and had not taken it today, nonetheless her medicine nadir should not be quite that high.  I would like for her to monitor her morning blood pressure readings prior to starting medication to see if she is still not controlled.  We might consider adding something like a clonidine patch which would not require any digestion of the medication at night to see if it can give her better blood pressure control.  Follow-up 6 months or sooner as necessary.  Hazle Lites, MD, [facility], FNLA, FACP  [facility]  Medical Director of the [facility]  Card[facility]  [facility] Diplomate of the American Board of Clinical Lipidology Attending Cardiologist  Direct Dial: [phone]  Fax: [phone]  Website:  [email or web address]   Hazle Lites 08/10/2023, 8:18 AM

## 2023-08-23 DIAGNOSIS — M25561 Pain in right knee: Secondary | ICD-10-CM | POA: Diagnosis not present

## 2023-08-23 DIAGNOSIS — R2681 Unsteadiness on feet: Secondary | ICD-10-CM | POA: Diagnosis not present

## 2023-08-23 DIAGNOSIS — M25551 Pain in right hip: Secondary | ICD-10-CM | POA: Diagnosis not present

## 2023-08-23 DIAGNOSIS — Z9181 History of falling: Secondary | ICD-10-CM | POA: Diagnosis not present

## 2023-08-30 DIAGNOSIS — S73191A Other sprain of right hip, initial encounter: Secondary | ICD-10-CM | POA: Diagnosis not present

## 2023-08-30 DIAGNOSIS — M1611 Unilateral primary osteoarthritis, right hip: Secondary | ICD-10-CM | POA: Diagnosis not present

## 2023-08-30 DIAGNOSIS — M779 Enthesopathy, unspecified: Secondary | ICD-10-CM | POA: Diagnosis not present

## 2023-08-30 DIAGNOSIS — S76011A Strain of muscle, fascia and tendon of right hip, initial encounter: Secondary | ICD-10-CM | POA: Diagnosis not present

## 2023-08-30 DIAGNOSIS — R2681 Unsteadiness on feet: Secondary | ICD-10-CM | POA: Diagnosis not present

## 2023-09-09 DIAGNOSIS — M25551 Pain in right hip: Secondary | ICD-10-CM | POA: Diagnosis not present

## 2023-09-15 ENCOUNTER — Ambulatory Visit (INDEPENDENT_AMBULATORY_CARE_PROVIDER_SITE_OTHER): Admitting: Gastroenterology

## 2023-09-15 ENCOUNTER — Encounter (INDEPENDENT_AMBULATORY_CARE_PROVIDER_SITE_OTHER): Payer: Self-pay | Admitting: Gastroenterology

## 2023-09-15 VITALS — BP 184/77 | HR 71 | Temp 98.1°F | Ht 64.0 in | Wt 151.4 lb

## 2023-09-15 DIAGNOSIS — K5900 Constipation, unspecified: Secondary | ICD-10-CM

## 2023-09-15 DIAGNOSIS — K5909 Other constipation: Secondary | ICD-10-CM

## 2023-09-15 DIAGNOSIS — Z860101 Personal history of adenomatous and serrated colon polyps: Secondary | ICD-10-CM | POA: Diagnosis not present

## 2023-09-15 DIAGNOSIS — K219 Gastro-esophageal reflux disease without esophagitis: Secondary | ICD-10-CM

## 2023-09-15 NOTE — Patient Instructions (Signed)
 Continue linzess  145mcg daily and protonix  40mg  daily  Continue with healthy eating, you are doing awesome! We will plan for repeat colonoscopy in September, we will reach out closer to time to schedule this  Follow up 1 year  It was a pleasure to see you today. I want to create trusting relationships with patients and provide genuine, compassionate, and quality care. I truly value your feedback! please be on the lookout for a survey regarding your visit with me today. I appreciate your input about our visit and your time in completing this!    Miller Edgington L. Airon Sahni, MSN, APRN, AGNP-C Adult-Gerontology Nurse Practitioner [facility]

## 2023-09-15 NOTE — Progress Notes (Addendum)
 Referring Provider: Lauran Pollard, MD Primary Care Physician:  Lauran Pollard, MD Primary GI Physician: Dr. Sammi Crick   Chief Complaint  Patient presents with   Follow-up    Pt arrives for follow up. Pt still has abdominal pain at times. Pt states they have found a hole in her heart. Pt states she is under stress but she is trying to control stress. Taking meds as directed. Pt meals have changed with Meals on Wheels; pt is on diet to help with stomach.     HPI:   NELEH MULDOON is a 58 y.o. female with past medical history of breast cancer status post chemoradiation, coronary artery disease status post stent placement, brain tumor, depression, diabetes, hypertension, hyperlipidemia, stroke, seizures, MI with cardiac stents   Patient presenting today for follow up of: GERD Dysphagia Constipation  Last seen march 2025, at that time having dysphagia, burnign in stomach. Not on PPI, taking mylanta. Recent TSH as 8.359, taking linzess  145mcg daily, having a BM maybe once monthly.   Recommended start protonix  40mg  daily, schedule EGD, repeat Colonoscopy in September, increase linzess  to 290mcg daily, good water  intake, high fiber diet  Present: Reports dysphagia is resolved since EGD with dilation. She has no acid reflux symptoms on protonix  40mg  daily. No further burning in her stomach.   She is taking linzess  145mcg daily, she is having about 4 BMs per day. Stools are looser, but not watery, denies urgency. Feels constipation is well managed. No rectal bleeding or melena.  She notes a lot of stress recently with her grandson being sick. Having some upper abdominal pain that she feels is from stress. She is trying to meditate and reduce her stress, she notes improvement in her symptoms when she tries to reduce her stress or when she meditates. No changes in appetite.   She is taking some classes at [facility] to help her learn how to eat and cook healthier as well as some classes  on stress management.    Last EGD: 07/2023- No endoscopic esophageal abnormality to explain                            patient's dysphagia. Esophagus dilated. Dilated.                           - Three pills were found in the stomach.                           - Normal examined duodenum.                           - Biopsies were taken with a cold forceps for  evaluation of eosinophilic esophagitis. ESOPHAGUS, DISTAL, BIOPSY:  -  Squamous mucosa with no significant pathology.   ESOPHAGUS, MID, BIOPSY:  -  Squamous mucosa with no significant pathology.   No repeat EGD indicated    Last Colonoscopy: 01/05/2017 6 polyps were removed from the sigmoid colon (1 tubular adenoma, rest were hyperplastic polyps), external hemorrhoids   Repeat in 7 years Filed Weights   09/15/23 1150  Weight: 151 lb 6.4 oz (68.7 kg)     Past Medical History:  Diagnosis Date   Anemia    Anxiety    Bilateral breast cysts 12/30/2015   Brain tumor (HCC)    Breast  cancer [facility])    Left Breast Cancer   Breast disorder    BV (bacterial vaginosis) 09/09/2015   Coronary atherosclerosis of native coronary artery    a. s/p DES to RCA in 2013 b. DES to LAD in 2014 c. cath in 04/2016 showing patent LAD stent with D2 jailed by LAD stent and CTO of RCA with left to right collaterals present   Cyst of pharynx or nasopharynx    [name]   Decreased libido 12/25/2018   Depression    Diabetes mellitus without complication (HCC)    Essential hypertension    Falls    x 3-4 in past year   GERD (gastroesophageal reflux disease)    Headache    Heart attack (HCC)    x4   History of hematuria    Mixed hyperlipidemia    Personal history of chemotherapy    Left Breast Cancer   Personal history of radiation therapy    Left Breast Cancer   Pre-diabetes    PUD (peptic ulcer disease)    Seizures (HCC)    Shingles 09/09/2015   Stroke (HCC)    12-2017 on Plavix , only deficit is headaches   Suicide  attempt (HCC)    3 attempts in remote past   Trichimoniasis 08/20/2019   Treated 08/20/19    Uterine cancer (HCC)    Vitamin D  deficiency disease 04/03/2019    Past Surgical History:  Procedure Laterality Date   ABDOMINAL HYSTERECTOMY     BIOPSY  11/28/2019   Procedure: BIOPSY;  Surgeon: Ruby Corporal, MD;  [facility];  Service: Endoscopy;;  antral, gastric body    BIOPSY  08/18/2022   Procedure: BIOPSY;  Surgeon: Urban Garden, MD;  [facility];  Service: Gastroenterology;;   BLADDER SURGERY     BREAST LUMPECTOMY Left    BREAST LUMPECTOMY Right 02/24/2018   Procedure: RIGHT BREAST LUMPECTOMY ERAS PATHWAY;  Surgeon: Caralyn Chandler, MD;  Location: [facility] OR;  Service: General;  Laterality: Right;   CARDIAC CATHETERIZATION N/A 05/10/2016   Procedure: Left Heart Cath and Coronary Angiography;  Surgeon: Arty Binning, MD;  Location: [facility] INVASIVE CV LAB;  Service: Cardiovascular;  Laterality: N/A;   CAUTERIZE INNER NOSE  07/13/2020   COLONOSCOPY  May 2010   Fleishman: normal rectum, internal hemorrhoids, , benign colonic polyp   COLONOSCOPY N/A 01/05/2017   Procedure: COLONOSCOPY;  Surgeon: Ruby Corporal, MD;  [facility];  Service: Endoscopy;  Laterality: N/A;  1:00   CORONARY PRESSURE/FFR STUDY N/A 10/23/2020   Procedure: INTRAVASCULAR PRESSURE WIRE/FFR STUDY;  Surgeon: Arty Binning, MD;  Location: [facility] INVASIVE CV LAB;  Service: Cardiovascular;  Laterality: N/A;   CORONARY PRESSURE/FFR STUDY N/A 03/30/2021   Procedure: INTRAVASCULAR PRESSURE WIRE/FFR STUDY;  Surgeon: Sammy Crisp, MD;  Location: [facility] INVASIVE CV LAB;  Service: Cardiovascular;  Laterality: N/A;   ESOPHAGEAL DILATION  11/28/2019   Procedure: ESOPHAGEAL DILATION;  Surgeon: Ruby Corporal, MD;  [facility];  Service: Endoscopy;;   ESOPHAGEAL DILATION N/A 07/15/2023   Procedure: DILATION, ESOPHAGUS;  Surgeon: Urban Garden, MD;  [facility];   Service: Gastroenterology;  Laterality: N/A;  8:15AM;ASA 3   ESOPHAGOGASTRODUODENOSCOPY     2001 Dr. Sabra Cramp: distal esophagitis, small hiatal hernia,. Dr. Felipe Horton 2006? no records available currently, pt also reports  EGD a few years ago with Dr. Riley Cheadle, do not have these reports anywhere in medical records   ESOPHAGOGASTRODUODENOSCOPY  06/02/2011   [id number] pill impaction as described above s/p dilation of a probable cervical esophageal web/bx abnormal esophageal and gastric mucosa. + H.pylori gastritis    ESOPHAGOGASTRODUODENOSCOPY N/A 07/15/2023   Procedure: EGD (ESOPHAGOGASTRODUODENOSCOPY);  Surgeon: Umberto Ganong, Bearl Limes, MD;  [facility];  Service: Gastroenterology;  Laterality: N/A;  8:15AM;ASA 3   ESOPHAGOGASTRODUODENOSCOPY (EGD) WITH PROPOFOL  N/A 11/28/2019   Procedure: ESOPHAGOGASTRODUODENOSCOPY (EGD) WITH PROPOFOL ;  Surgeon: Ruby Corporal, MD;  [facility];  Service: Endoscopy;  Laterality: N/A;  1020   ESOPHAGOGASTRODUODENOSCOPY (EGD) WITH PROPOFOL  N/A 08/18/2022   Procedure: ESOPHAGOGASTRODUODENOSCOPY (EGD) WITH PROPOFOL ;  Surgeon: Urban Garden, MD;  [facility];  Service: Gastroenterology;  Laterality: N/A;  8:30AM;ASA 1   EYE SURGERY     Removed glass   HAND SURGERY Right    Left breast lumpectomy     Benign   LEFT HEART CATH AND CORONARY ANGIOGRAPHY N/A 10/25/2018   Procedure: LEFT HEART CATH AND CORONARY ANGIOGRAPHY;  Surgeon: Wenona Hamilton, MD;  [facility];  Service: Cardiovascular;  Laterality: N/A;   LEFT HEART CATH AND CORONARY ANGIOGRAPHY N/A 10/23/2020   Procedure: LEFT HEART CATH AND CORONARY ANGIOGRAPHY;  Surgeon: Arty Binning, MD;  [facility];  Service: Cardiovascular;  Laterality: N/A;   LEFT HEART CATH AND CORONARY ANGIOGRAPHY N/A 03/30/2021   Procedure: LEFT HEART CATH AND CORONARY ANGIOGRAPHY;  Surgeon: Sammy Crisp, MD;  [facility];  Service:  Cardiovascular;  Laterality: N/A;   LEFT HEART CATH AND CORONARY ANGIOGRAPHY N/A 01/22/2022   Procedure: LEFT HEART CATH AND CORONARY ANGIOGRAPHY;  Surgeon: Arnoldo Lapping, MD;  Location: [facility] INVASIVE CV LAB;  Service: Cardiovascular;  Laterality: N/A;   LEFT HEART CATHETERIZATION WITH CORONARY ANGIOGRAM N/A 07/03/2014   Procedure: LEFT HEART CATHETERIZATION WITH CORONARY ANGIOGRAM;  Surgeon: Arty Binning, MD;  Location: [facility] CATH LAB;  Service: Cardiovascular;  Laterality: N/A;   POLYPECTOMY  01/05/2017   Procedure: POLYPECTOMY;  Surgeon: Ruby Corporal, MD;  [facility];  Service: Endoscopy;;  colon   RIGHT/LEFT HEART CATH AND CORONARY ANGIOGRAPHY N/A 11/03/2022   Procedure: RIGHT/LEFT HEART CATH AND CORONARY ANGIOGRAPHY;  Surgeon: Odie Benne, MD;  [facility]: [facility] CV LAB;  Service: Cardiovascular;  Laterality: N/A;   SHOULDER SURGERY Left     Current Outpatient Medications  Medication Sig Dispense Refill   albuterol  (VENTOLIN  HFA) 108 (90 Base) MCG/ACT inhaler Inhale 2 puffs into the lungs every 4 (four) hours as needed for wheezing or shortness of breath. (Patient taking differently: Inhale 2 puffs into the lungs as needed for wheezing or shortness of breath.) 1 each 3   ALPRAZolam  (XANAX ) 0.5 MG tablet Take 1 tablet (0.5 mg total) by mouth 2 (two) times daily as needed for anxiety. (Patient taking differently: Take 0.5 mg by mouth daily as needed for anxiety.) 30 tablet 0   aspirin  EC 81 MG tablet Take 1 tablet (81 mg total) by mouth daily. Swallow whole. 90 tablet 3   Cholecalciferol (DIALYVITE VITAMIN D  5000) 125 MCG (5000 UT) capsule Take 5,000 Units by mouth in the morning.     clopidogrel  (PLAVIX ) 75 MG tablet Take 1 tablet (75 mg total) by mouth daily. 90 tablet 3   escitalopram  (LEXAPRO ) 20 MG tablet Take 1 tablet (20 mg total) by mouth daily. 30 tablet 3   Evolocumab (REPATHA SURECLICK Antimony) Inject 140 mg into the skin every 14 (fourteen) days.  fluorometholone (FML) 0.1 % ophthalmic suspension Place 1 drop into both eyes daily.     fluticasone -salmeterol (ADVAIR) 100-50 MCG/ACT AEPB INHALE 1 PUFF BY MOUTH TWICE DAILY (Patient taking differently: Inhale 1 puff into the lungs as needed (wheezing/SOB).) 60 each 10   gabapentin  (NEURONTIN ) 100 MG capsule Take 1 capsule (100 mg total) by mouth 3 (three) times daily. 30 capsule 0   isosorbide  mononitrate (IMDUR ) 30 MG 24 hr tablet Take 1 tablet (30 mg total) by mouth daily. 90 tablet 3   linaclotide  (LINZESS ) 145 MCG CAPS capsule Take 145 mcg by mouth daily before breakfast.     NIFEdipine  (PROCARDIA  XL/NIFEDICAL XL) 60 MG 24 hr tablet Take 1 tablet (60 mg total) by mouth daily. 30 tablet 3   nitroGLYCERIN  (NITROSTAT ) 0.4 MG SL tablet DISSOLVE 1 TABLET UNDER THE TONGUE AS NEEDED FOR CHEST PAIN EVERY 5 MINUTES UP TO 3 TIMES. IF NO RELIEF CALL 911. (Patient taking differently: Place 0.4 mg under the tongue every 5 (five) minutes as needed for chest pain.) 25 tablet 1   NP THYROID  60 MG tablet Take 60 mg by mouth every morning.     pantoprazole  (PROTONIX ) 40 MG tablet Take 1 tablet (40 mg total) by mouth daily. 60 tablet 1   prochlorperazine  (COMPAZINE ) 10 MG tablet Take 1 tablet (10 mg total) by mouth every 6 (six) hours as needed for nausea or vomiting. 90 tablet 1   RESTASIS 0.05 % ophthalmic emulsion Place 2 drops into both eyes as needed (dry eye).     traZODone (DESYREL) 50 MG tablet Take 50 mg by mouth at bedtime as needed for sleep.     carvedilol  (COREG ) 12.5 MG tablet Take 1 tablet (12.5 mg total) by mouth 2 (two) times daily. 180 tablet 3   No current facility-administered medications for this visit.    Allergies as of 09/15/2023 - Review Complete 09/15/2023  Allergen Reaction Noted   Bee venom Anaphylaxis and Other (See Comments) 01/05/2017   Lipitor [atorvastatin ] Rash and Other (See Comments) 11/03/2020   Penicillins Anaphylaxis 01/26/2011   Shellfish allergy Swelling 03/14/2017    Spironolactone  Other (See Comments) 07/01/2022   Tomato Itching and Swelling 07/16/2020   Valsartan  Itching 06/11/2021   Zofran  [ondansetron ] Itching 05/20/2020   Crestor [rosuvastatin] Rash and Other (See Comments) 03/26/2021   Ranolazine  Rash 01/29/2022    Social History   Socioeconomic History   Marital status: Legally Separated    Spouse name: Portia Brittle   Number of children: 3   Years of education: Assoc   Highest education level: Not on file  Occupational History   Occupation: Retired    Comment: disability  Tobacco Use   Smoking status: Former    Current packs/day: 0.00    Average packs/day: 0.2 packs/day for 32.0 years (6.4 ttl pk-yrs)    Types: Cigarettes, Cigars    Start date: 07/05/1983    Quit date: 04/03/2012    Years since quitting: 11.4   Smokeless tobacco: Never  Vaping Use   Vaping status: Never Used  Substance and Sexual Activity   Alcohol use: No    Alcohol/week: 0.0 standard drinks of alcohol   Drug use: No   Sexual activity: Yes    Birth control/protection: Surgical    Comment: hyst  Other Topics Concern   Not on file  Social History Narrative   Patient lives at new home with husband and extended family..On disability since age 18 yrs.   Social Drivers of Corporate investment banker  Strain: Low Risk  (06/03/2023)   Received from [facility]   Overall Financial Resource Strain (CARDIA)    Difficulty of Paying Living Expenses: Not very hard  Food Insecurity: No Food Insecurity (06/03/2023)   [facility]   Hunger Vital Sign    Worried About Running Out of Food in the Last Year: Never true    Ran Out of Food in the Last Year: Never true  Transportation Needs: No Transportation Needs (06/03/2023)   Received from [facility] - Transportation    Lack of Transportation (Medical): No    Lack of Transportation (Non-Medical): No  Physical Activity: Sufficiently Active (01/13/2021)   Exercise Vital Sign    Days of  Exercise per Week: 7 days    Minutes of Exercise per Session: 60 min  Stress: Stress Concern Present (01/13/2021)   [employer] of Occupational Health - Occupational Stress Questionnaire    Feeling of Stress : To some extent  Social Connections: Moderately Isolated (01/13/2021)   Social Connection and Isolation Panel [NHANES]    Frequency of Communication with Friends and Family: Never    Frequency of Social Gatherings with Friends and Family: Never    Attends Religious Services: More than 4 times per year    Active Member of [employer] or Organizations: No    Attends Engineer, structural: Never    Marital Status: Married    Review of systems General: negative for malaise, night sweats, fever, chills, weight los Neck: Negative for lumps, goiter, pain and significant neck swelling Resp: Negative for cough, wheezing, dyspnea at rest CV: Negative for chest pain, leg swelling, palpitations, orthopnea GI: denies melena, hematochezia, nausea, vomiting, diarrhea, constipation, dysphagia, odyonophagia, early satiety or unintentional weight loss. +occasional upper abdominal pain  MSK: Negative for joint pain or swelling, back pain, and muscle pain. Derm: Negative for itching or rash Psych: Denies depression, anxiety, memory loss, confusion. No homicidal or suicidal ideation.  The remainder of the review of systems is noncontributory.  Physical Exam: BP (!) 196/89   Pulse 71   Temp 98.1 F (36.7 C)   Ht 5' 4 (1.626 m)   Wt 151 lb 6.4 oz (68.7 kg)   BMI 25.99 kg/m  General:   Alert and oriented. No distress noted. Pleasant and cooperative.  Head:  Normocephalic and atraumatic. Eyes:  Conjuctiva clear without scleral icterus. Mouth:  Oral mucosa pink and moist. Good dentition. No lesions. Heart: Normal rate and rhythm, s1 and s2 heart sounds present.  Lungs: Clear lung sounds in all lobes. Respirations equal and unlabored. Abdomen:  +BS, soft, non-tender and non-distended. No  rebound or guarding. No HSM or masses noted. Derm: No palmar erythema or jaundice Msk:  Symmetrical without gross deformities. Normal posture. Extremities:  Without edema. Neurologic:  Alert and  oriented x4 Psych:  Alert and cooperative. Normal mood and affect.  Invalid input(s): 6 MONTHS   ASSESSMENT: MAIKO SALAIS is a 58 y.o. female presenting today for follow up of dysphagia, constipation, GERD  Dysphagia/GERD/burning in stomach: resolution of dysphagia after EGD with dilation. Protonix  40mg  keeps GERD controlled. She has no further burning in her stomach. Endorses some occasional upper abdominal discomfort during times of stress but this resolves when she meditates or can calm her stress down.   Constipation: did not increase linzess  after last visit, doing of linzess  daily, with about 4 BMs per day that are looser but not watery, no urgency or incontinence.  Suspect hypothyroidism that was being corrected was influencing this. No rectal bleeding or melena.  Due for colonoscopy in September as last was in 2018 with presence of TA. On recall list to be scheduled closer to due time.    PLAN:  -repeat TCS in September (on recall list) -continue protonix  40mg  daily  -continue linzess  145mcg daily  -continue with healthy eating habits  All questions were answered, patient verbalized understanding and is in agreement with plan as outlined above.   Follow Up: 1 year   Teah Votaw L. Adrien Alberta, MSN, APRN, AGNP-C Adult-Gerontology Nurse Practitioner [facility]   I have reviewed the note and agree with the APP's assessment as described in this progress note  Samantha Cress, MD Gastroenterology and Hepatology [facility] Gastroenterology

## 2023-09-16 ENCOUNTER — Other Ambulatory Visit (HOSPITAL_COMMUNITY)
Admission: RE | Admit: 2023-09-16 | Discharge: 2023-09-16 | Disposition: A | Discharge status: Home / Self Care | Source: Ambulatory Visit | Attending: Nephrology | Admitting: Nephrology

## 2023-09-16 ENCOUNTER — Emergency Department (HOSPITAL_COMMUNITY)
Admission: EM | Admit: 2023-09-16 | Discharge: 2023-09-16 | Discharge status: Against Medical Advice | Source: Home / Self Care

## 2023-09-16 DIAGNOSIS — I2510 Atherosclerotic heart disease of native coronary artery without angina pectoris: Secondary | ICD-10-CM | POA: Diagnosis not present

## 2023-09-16 DIAGNOSIS — N189 Chronic kidney disease, unspecified: Secondary | ICD-10-CM | POA: Diagnosis not present

## 2023-09-16 DIAGNOSIS — E1122 Type 2 diabetes mellitus with diabetic chronic kidney disease: Secondary | ICD-10-CM | POA: Insufficient documentation

## 2023-09-16 DIAGNOSIS — R809 Proteinuria, unspecified: Secondary | ICD-10-CM | POA: Insufficient documentation

## 2023-09-16 DIAGNOSIS — I129 Hypertensive chronic kidney disease with stage 1 through stage 4 chronic kidney disease, or unspecified chronic kidney disease: Secondary | ICD-10-CM | POA: Insufficient documentation

## 2023-09-16 DIAGNOSIS — I77810 Thoracic aortic ectasia: Secondary | ICD-10-CM | POA: Insufficient documentation

## 2023-09-16 DIAGNOSIS — I701 Atherosclerosis of renal artery: Secondary | ICD-10-CM | POA: Diagnosis not present

## 2023-09-16 LAB — CBC
HCT: 40.5 % (ref 36.0–46.0)
Hemoglobin: 13.6 g/dL (ref 12.0–15.0)
MCH: 28.5 pg (ref 26.0–34.0)
MCHC: 33.6 g/dL (ref 30.0–36.0)
MCV: 84.9 fL (ref 80.0–100.0)
Platelets: 267 10*3/uL (ref 150–400)
RBC: 4.77 MIL/uL (ref 3.87–5.11)
RDW: 14.7 % (ref 11.5–15.5)
WBC: 9.7 10*3/uL (ref 4.0–10.5)
nRBC: 0 % (ref 0.0–0.2)

## 2023-09-16 LAB — RENAL FUNCTION PANEL
Albumin: 4 g/dL (ref 3.5–5.0)
Anion gap: 9 (ref 5–15)
BUN: 13 mg/dL (ref 6–20)
CO2: 24 mmol/L (ref 22–32)
Calcium: 9 mg/dL (ref 8.9–10.3)
Chloride: 105 mmol/L (ref 98–111)
Creatinine, Ser: 0.67 mg/dL (ref 0.44–1.00)
GFR, Estimated: >60 mL/min (ref >60)
Glucose, Bld: 130 mg/dL — ABNORMAL HIGH (ref 70–99)
Phosphorus: 3.8 mg/dL (ref 2.5–4.6)
Potassium: 3.4 mmol/L — ABNORMAL LOW (ref 3.5–5.1)
Sodium: 138 mmol/L (ref 135–145)

## 2023-09-16 LAB — PROTEIN / CREATININE RATIO, URINE
Creatinine, Urine: 162 mg/dL
Protein Creatinine Ratio: 0.07 mg/mg{creat} (ref 0.00–0.15)
Total Protein, Urine: 12 mg/dL

## 2023-09-16 LAB — SODIUM, URINE, RANDOM: Sodium, Ur: 100 mmol/L

## 2023-09-17 LAB — MICROALBUMIN / CREATININE URINE RATIO
Creatinine, Urine: 137.8 mg/dL
Microalb Creat Ratio: 11 mg/g{creat} (ref 0–29)
Microalb, Ur: 14.6 ug/mL — ABNORMAL HIGH

## 2023-09-19 ENCOUNTER — Other Ambulatory Visit (HOSPITAL_COMMUNITY)
Admission: RE | Admit: 2023-09-19 | Discharge: 2023-09-19 | Disposition: A | Discharge status: Home / Self Care | Source: Ambulatory Visit | Attending: Nephrology | Admitting: Nephrology

## 2023-09-19 DIAGNOSIS — I10 Essential (primary) hypertension: Secondary | ICD-10-CM | POA: Insufficient documentation

## 2023-09-19 DIAGNOSIS — I701 Atherosclerosis of renal artery: Secondary | ICD-10-CM | POA: Insufficient documentation

## 2023-09-19 DIAGNOSIS — R809 Proteinuria, unspecified: Secondary | ICD-10-CM | POA: Diagnosis not present

## 2023-09-19 DIAGNOSIS — N189 Chronic kidney disease, unspecified: Secondary | ICD-10-CM | POA: Insufficient documentation

## 2023-09-19 DIAGNOSIS — D631 Anemia in chronic kidney disease: Secondary | ICD-10-CM | POA: Insufficient documentation

## 2023-09-19 DIAGNOSIS — I77810 Thoracic aortic ectasia: Secondary | ICD-10-CM | POA: Diagnosis not present

## 2023-09-19 DIAGNOSIS — I351 Nonrheumatic aortic (valve) insufficiency: Secondary | ICD-10-CM | POA: Diagnosis not present

## 2023-09-19 DIAGNOSIS — I2510 Atherosclerotic heart disease of native coronary artery without angina pectoris: Secondary | ICD-10-CM | POA: Diagnosis not present

## 2023-09-19 DIAGNOSIS — N182 Chronic kidney disease, stage 2 (mild): Secondary | ICD-10-CM | POA: Diagnosis not present

## 2023-09-19 DIAGNOSIS — I129 Hypertensive chronic kidney disease with stage 1 through stage 4 chronic kidney disease, or unspecified chronic kidney disease: Secondary | ICD-10-CM | POA: Diagnosis not present

## 2023-09-22 LAB — PROTEIN ELECTRO, RANDOM URINE
Albumin ELP, Urine: 100 %
Alpha-1-Globulin, U: 0 %
Alpha-2-Globulin, U: 0 %
Beta Globulin, U: 0 %
Gamma Globulin, U: 0 %
Total Protein, Urine: 5.2 mg/dL

## 2023-09-22 LAB — IMMUNOFIXATION, URINE

## 2023-10-17 DIAGNOSIS — R739 Hyperglycemia, unspecified: Secondary | ICD-10-CM | POA: Diagnosis not present

## 2023-10-17 DIAGNOSIS — E559 Vitamin D deficiency, unspecified: Secondary | ICD-10-CM | POA: Diagnosis not present

## 2023-10-17 DIAGNOSIS — E785 Hyperlipidemia, unspecified: Secondary | ICD-10-CM | POA: Diagnosis not present

## 2023-10-17 DIAGNOSIS — I10 Essential (primary) hypertension: Secondary | ICD-10-CM | POA: Diagnosis not present

## 2023-10-24 DIAGNOSIS — I2510 Atherosclerotic heart disease of native coronary artery without angina pectoris: Secondary | ICD-10-CM | POA: Diagnosis not present

## 2023-10-24 DIAGNOSIS — Z6824 Body mass index (BMI) 24.0-24.9, adult: Secondary | ICD-10-CM | POA: Diagnosis not present

## 2023-10-24 DIAGNOSIS — R053 Chronic cough: Secondary | ICD-10-CM | POA: Diagnosis not present

## 2023-10-24 DIAGNOSIS — I351 Nonrheumatic aortic (valve) insufficiency: Secondary | ICD-10-CM | POA: Diagnosis not present

## 2023-10-24 DIAGNOSIS — R079 Chest pain, unspecified: Secondary | ICD-10-CM | POA: Diagnosis not present

## 2023-10-31 ENCOUNTER — Other Ambulatory Visit (INDEPENDENT_AMBULATORY_CARE_PROVIDER_SITE_OTHER): Payer: Self-pay | Admitting: Gastroenterology

## 2023-10-31 DIAGNOSIS — E559 Vitamin D deficiency, unspecified: Secondary | ICD-10-CM | POA: Diagnosis not present

## 2023-10-31 DIAGNOSIS — I10 Essential (primary) hypertension: Secondary | ICD-10-CM | POA: Diagnosis not present

## 2023-10-31 DIAGNOSIS — E785 Hyperlipidemia, unspecified: Secondary | ICD-10-CM | POA: Diagnosis not present

## 2023-10-31 DIAGNOSIS — R739 Hyperglycemia, unspecified: Secondary | ICD-10-CM | POA: Diagnosis not present

## 2023-11-28 ENCOUNTER — Telehealth: Payer: Self-pay | Admitting: Internal Medicine

## 2023-11-28 NOTE — Telephone Encounter (Signed)
 Pt would like a c/b regarding procedure at [facility] ordered by Dr. Mona per pt's PCP. Please advise.

## 2023-11-28 NOTE — Telephone Encounter (Signed)
 Spoke with pt, she reports for 1 month now she has had chest pain, SOB, and cough. She was seen by her PCP and they told her she need to be seen right away because her heart is not good. They told her they heard gurgling noises in her chest when they listen. They told her the cough was from her heart. She reports when she walks to the mailbox she will get SOB, start coughing and almost pass out. The patient reports she has tried calkling us  but nom one answers the phone, she can not get into my chart and EMS only takes her to [place] or [place] and they will not see her. Follow up scheduled

## 2023-11-29 ENCOUNTER — Encounter (HOSPITAL_BASED_OUTPATIENT_CLINIC_OR_DEPARTMENT_OTHER): Payer: Self-pay | Admitting: Family

## 2023-11-29 ENCOUNTER — Encounter (HOSPITAL_BASED_OUTPATIENT_CLINIC_OR_DEPARTMENT_OTHER): Payer: Self-pay

## 2023-11-29 ENCOUNTER — Ambulatory Visit (INDEPENDENT_AMBULATORY_CARE_PROVIDER_SITE_OTHER): Admitting: Family

## 2023-11-29 VITALS — BP 152/100 | HR 73 | Resp 16 | Ht 64.0 in | Wt 149.8 lb

## 2023-11-29 DIAGNOSIS — I25110 Atherosclerotic heart disease of native coronary artery with unstable angina pectoris: Secondary | ICD-10-CM | POA: Diagnosis not present

## 2023-11-29 DIAGNOSIS — I1A0 Resistant hypertension: Secondary | ICD-10-CM

## 2023-11-29 DIAGNOSIS — G8929 Other chronic pain: Secondary | ICD-10-CM | POA: Diagnosis not present

## 2023-11-29 DIAGNOSIS — I351 Nonrheumatic aortic (valve) insufficiency: Secondary | ICD-10-CM | POA: Diagnosis not present

## 2023-11-29 DIAGNOSIS — R0609 Other forms of dyspnea: Secondary | ICD-10-CM | POA: Diagnosis not present

## 2023-11-29 DIAGNOSIS — I25119 Atherosclerotic heart disease of native coronary artery with unspecified angina pectoris: Secondary | ICD-10-CM | POA: Diagnosis not present

## 2023-11-29 DIAGNOSIS — E785 Hyperlipidemia, unspecified: Secondary | ICD-10-CM

## 2023-11-29 DIAGNOSIS — R296 Repeated falls: Secondary | ICD-10-CM | POA: Diagnosis not present

## 2023-11-29 MED ORDER — ISOSORBIDE MONONITRATE ER 60 MG PO TB24: 60.0000 mg | ORAL_TABLET | Freq: Every day | ORAL | 1 refills | Status: AC

## 2023-11-29 MED ORDER — PREDNISONE 50 MG PO TABS: ORAL_TABLET | ORAL | 0 refills | Status: DC

## 2023-11-29 NOTE — Progress Notes (Unsigned)
 Cardiology Office Note   Date:  11/29/2023  ID:  Bethany Wang, DOB 08/13/1965, MRN [id number] PCP: Trudy Vaughn FALCON, MD  [facility] Providers Cardiologist:  Vinie JAYSON Maxcy, MD { Click to update primary MD,subspecialty MD or APP then REFRESH:1}    History of Present Illness Bethany Wang is a 58 y.o. female ***  with a hx of CAD (s/p DES to RCA 2013, DES to LAD 2014, cath 04/2016 and 10/2020 and 01/2022 patent LAD stent with jailed D2 by LAD stent and CTO RCA to L-R collaterals), HTN, DM2  Hospitalized July 2020 with chest pain with LHC with occlusion of RCA with previously placed coronary stent and left-to-right collaterals.  50% ISR of proximal LAD and 90% diagonal ostial narrowing.  Also 60 to 70% obtuse marginal narrowing of 50% distal circumflex disease.  Recommended for medical therapy.  Intolerant to Atorvastatin , Rosuvastatin and Repatha initiated. Repeat catheterization 10/2020, 03/2021 stable compared top previous with recommendation for medical management. Imdur  adjusted to 60mg  BID. Trialed on Ranolazine  but developed rash and discontinued.  Multiple interruptions in medical therapy. Seen 10/2021 noted to not be taking thyroid  medications, Amlodipine , hydrochlorothiazide  reportedly on hold by PCP. Amlodipine  and hydrochlorothiazide  resumed.   Saw Dr. Maxcy 01/20/22 for worsening angina with [facility] 01/22/22 revealing stable multivessel coronary disease. Medical therapy recommended.  Also suggested escalation of antihypertensive therapy as suspected supply/demand mismatch driving symptoms.   At visit 05/31/22 Chlorthalidone  transitioned to Spironolactone , but did not tolerate with nausea.. At visit 07/01/22 Doxazosin  2mg  at bedtime initiated, has not been continued.   At visit 07/2022 multiple episdoes of syncope also concerning for seizure. Referred to neurology. Monitor with episodes of PSVT, Carvedilol  switched to Metoprolol  100mg  BID. Echo 07/2022 normal LVEF, moderate to  severe AI.She saw Dr. Maxcy 10/01/22 and due to progressive chest pain 10/29/22, LHC 11/03/22 vessel CAD with no change, stable to last cardiac catheterization.    Several medication changes since that time: 02/2023 and was not taking several medications for past week.  Amlodipine  and carvedilol  were resumed.   03/21/2023 nephrology transitioned amlodipine  nifedipine  30 mg daily.  Carvedilol  increased to 12.5 mg twice daily.  04/25/23 nifedipine  increased to 60 mg daily.   05/12/2023 noted occasional chest discomfort and elevated blood pressure.  Imdur  30 mg daily initiated.    Most recent echo updated 05/27/2023 with LVEF 55-60%, mild LVH,gr1dd, trivial MR, moderate AI by Dr. Tyrone review.  Admitted 06/02/23 at [facility] in [place] with nitroglycerin  drip in ED did not significantly improve pain. Imdur  added. Significant social stressors were noted including loss of multiple family members and concerns with her ex-husband.  Secondary hypertension workup: 10/2021 Carotid duplex  no significant stenosis. 10/2021 MR abdomen adrenal glands appear normal. No suspicious renal mass or hydronephrosis identified bilaterally. Tiny cortical cyst in lower pole right kidney 02/2022 Cortisol, catecholamines, metanephrines, renin-aldosterone normal. 02/2022 Renal dopplers  mild to moderate right renal artery stenosis and no stenosis on left.   06/2022 sleep study no significant OSA but mild sleep apnea during REM, no CPAP recommended.   Presents today for follow up with her daughter. Moderate AI and echo reviewed in detail. No chest pain, pressure, tightness. She is using her Advair BID and Albuterol  at lunchtime. Reports exertional dyspnea with no cough, wheeze. This is unchanged from previous. She brings BP log from home with readings 106/61, 101/62, 182/95 (prior to medications), 135/77, 143/90. Felt relatively weak with episodes of hypotension. ***  Reports worsening chest pain about a  month ago and feels her chest  pain is progressing. Her PCP told her she had gurgling and wanted her to be checked by cardiology. She describes chest pain as sharp pain like would after running. She is taking nitroglycerin  daily sometimes 3-4 times per day. The nitroglycerin  eases the pain but does not resolve it completely. She is taking two tablets but not taking a third. She reports she is able to keep her medication down and not having as many issues with nausea, vomiting. Her BP at home has been high but no specific readings reported. Heart at home usually 80-90s. She has had some chest pain wake her from sleep.   Previous antihypertensives: Valsartan  - itching Metoprolol  - transition to Carvedilol  Spironolactone  - nausea, dehydration Doxazosin   Contacted the office yesterday noting 1 month history of chest pain, shortness of breath, cough.  She also reported shortness of breath and near syncope when walking to the mailbox.  ROS: Please see the history of present illness.    All other systems reviewed and are negative.   Studies Reviewed EKG Interpretation Date/Time:  Tuesday November 29 2023 14:13:03 EDT Ventricular Rate:  73 PR Interval:  154 QRS Duration:  96 QT Interval:  394 QTC Calculation: 434 R Axis:   6  Text Interpretation: Normal sinus rhythm  Artifact noted in lead I, II No acute ST/T wave changes      Confirmed by [name] ([id number]) on 11/29/2023 2:28:42 PM    *** Risk Assessment/Calculations {Does this patient have ATRIAL FIBRILLATION?:[id number]} The patient's 1st BP is elevated (>139/89)*** Repeat BP and {Click to enter a 2nd BP Refresh Note  :1}   STOP-Bang Score:     { Consider Dx Sleep Disordered Breathing or Sleep Apnea  ICD G47.33          :1}    Physical Exam VS:  BP (!) 152/100 (BP Location: Left Arm, Patient Position: Sitting, Cuff Size: Normal)   Pulse 73   Resp 16   Ht 5' 4 (1.626 m)   Wt 149 lb 12.8 oz (67.9 kg)   SpO2 97%   BMI 25.71 kg/m        Wt  Readings from Last 3 Encounters:  11/29/23 149 lb 12.8 oz (67.9 kg)  09/15/23 151 lb 6.4 oz (68.7 kg)  08/10/23 149 lb 9.6 oz (67.9 kg)    GEN: Well nourished, well developed in no acute distress NECK: No JVD; No carotid bruits CARDIAC: ***RRR, no murmurs, rubs, gallops RESPIRATORY:  Clear to auscultation without rales, wheezing or rhonchi  ABDOMEN: Soft, non-tender, non-distended EXTREMITIES:  No edema; No deformity   ASSESSMENT AND PLAN HTN - BP elevated in clinic and at home. Catecholamines, metanephrines, renin aldosterone ratio, cortisol unremarkable. 10/2021 normal adrenal glands.  Discussed to monitor BP at home at least 2 hours after medications and sitting for 5-10 minutes.  Continue Coreg  12.5mg  BID, Nifedipine  60mg  daily. Increase Imdur  to 60mg  daily as below for angina.   Hypothyroidism - Continue to follow with PCP.   CAD /  HLD, LDL goal <55 - Prior DES RCA 2013, DES LAD 2014. Most recent cath 10/2022 was stable. One month history of chest pain with exertion and also chest pain while sleeping concerning for unstable angina. EKG today no acute ST/T wave changes. Increase Imdur  to 60mg  daily for antianginal benefit. Due to concern for unstable angina, set up for LHC with Dr. Darron 12/01/23. GDMT Aspirin  81mg  daily, Plavix  75mg  daily, Coreg  12.5mg  BID, Imdur  30mg  daily,  Repatha140mg  q2 weeks. Did not tolerate statin. Heart healthy diet and regular cardiovascular exercise encouraged.   06/02/23 LDL 62.   Palpitations - Prior monitor PSVT. Continue Carvedilol  12.5mg  BID. Denies palpitations.   Hx of CVA - Continue Repatha 140mg  q14 days, aspirin  81mg  daily, plavix  75 mg daily.   Moderate AI - ***  COPD / Tobacco use - Anticipate this is etiology of her exertional dyspnea. No wheeze, cough. Continue present inhalers per PCP. Smoking cessation encouraged. Recommend utilization of [id number].     Informed Consent   Shared Decision Making/Informed Consent{ All outpatient stress  tests require an informed consent ([id number]) ATTESTATION ORDER       :[id number]} The risks [stroke (1 in 1000), death (1 in 1000), kidney failure [usually temporary] (1 in 500), bleeding (1 in 200), allergic reaction [possibly serious] (1 in 200)], benefits (diagnostic support and management of coronary artery disease) and alternatives of a cardiac catheterization were discussed in detail with Ms. Beynon and she is willing to proceed.     Dispo: follow up 2-3 weeks after LHC  Signed, Reche GORMAN Finder, NP

## 2023-11-29 NOTE — H&P (View-Only) (Signed)
 Cardiology Office Note   Date:  11/29/2023  ID:  Bethany Wang, DOB 1965-11-01, MRN [id number] PCP: Trudy Vaughn FALCON, MD   [facility] Providers Cardiologist:  Vinie JAYSON Maxcy, MD     History of Present Illness Bethany Wang is a 58 y.o. female with a hx of CAD (s/p DES to RCA 2013, DES to LAD 2014, cath 04/2016 and 10/2020 and 01/2022 patent LAD stent with jailed D2 by LAD stent and CTO RCA to L-R collaterals), HTN, DM2  Hospitalized July 2020 with chest pain with LHC with occlusion of RCA with previously placed coronary stent and left-to-right collaterals.  50% ISR of proximal LAD and 90% diagonal ostial narrowing.  Also 60 to 70% obtuse marginal narrowing of 50% distal circumflex disease.  Recommended for medical therapy.  Intolerant to Atorvastatin , Rosuvastatin and Repatha initiated. Repeat catheterization 10/2020, 03/2021 stable compared top previous with recommendation for medical management. Imdur  adjusted to 60mg  BID. Trialed on Ranolazine  but developed rash and discontinued.  Multiple interruptions in medical therapy. Seen 10/2021 noted to not be taking thyroid  medications, Amlodipine , hydrochlorothiazide  reportedly on hold by PCP. Amlodipine  and hydrochlorothiazide  resumed.   Saw Dr. Maxcy 01/20/22 for worsening angina with [facility] 01/22/22 revealing stable multivessel coronary disease. Medical therapy recommended.  Also suggested escalation of antihypertensive therapy as suspected supply/demand mismatch driving symptoms.   At visit 05/31/22 Chlorthalidone  transitioned to Spironolactone , but did not tolerate with nausea.. At visit 07/01/22 Doxazosin  2mg  at bedtime initiated, has not been continued.   At visit 07/2022 multiple episdoes of syncope also concerning for seizure. Referred to neurology. Monitor with episodes of PSVT, Carvedilol  switched to Metoprolol  100mg  BID. Echo 07/2022 normal LVEF, moderate to severe AI.She saw Dr. Maxcy 10/01/22 and due to progressive chest pain  10/29/22, LHC 11/03/22 vessel CAD with no change, stable to last cardiac catheterization.    Several medication changes since that time: 02/2023 and was not taking several medications for past week.  Amlodipine  and carvedilol  were resumed.   03/21/2023 nephrology transitioned amlodipine  nifedipine  30 mg daily.  Carvedilol  increased to 12.5 mg twice daily.  04/25/23 nifedipine  increased to 60 mg daily.   05/12/2023 noted occasional chest discomfort and elevated blood pressure.  Imdur  30 mg daily initiated.    Most recent echo updated 05/27/2023 with LVEF 55-60%, mild LVH,gr1dd, trivial MR, moderate AI by Dr. Tyrone review.  Admitted 06/02/23 at [facility] in [place] with nitroglycerin  drip in ED did not significantly improve pain. Imdur  added. Significant social stressors were noted including loss of multiple family members and concerns with her ex-husband.  Secondary hypertension workup: 10/2021 Carotid duplex  no significant stenosis. 10/2021 MR abdomen adrenal glands appear normal. No suspicious renal mass or hydronephrosis identified bilaterally. Tiny cortical cyst in lower pole right kidney 02/2022 Cortisol, catecholamines, metanephrines, renin-aldosterone normal. 02/2022 Renal dopplers  mild to moderate right renal artery stenosis and no stenosis on left.   06/2022 sleep study no significant OSA but mild sleep apnea during REM, no CPAP recommended.   At visit 06/09/2023 BP was elevated in clinic but controlled at home.  No changes made.  At visit 08/10/2023 she noted significant issues with nausea resulted contributed to not digesting medications well.  Presents today for follow-up independently. Reports worsening chest pain about a month ago and feels her chest pain is progressing. Her PCP told her she had gurgling and wanted her to be checked by cardiology. She describes chest pain as sharp pain like would after running. She is taking nitroglycerin  daily  sometimes 3-4 times per day. The nitroglycerin   eases the pain but does not resolve it completely. She is taking two tablets but not taking a third. She reports she is able to keep her medication down and not having as many issues with nausea, vomiting. Her BP at home has been high but no specific readings reported. Heart at home usually 80-90s. She has had some chest pain wake her from sleep.  She also notes when she begins to have chest pain and profound exertional dyspnea she will have near syncope like symptoms. She also notes stressors at home.   Previous antihypertensives: Valsartan  - itching Metoprolol  - transition to Carvedilol  Spironolactone  - nausea, dehydration Doxazosin    ROS: Please see the history of present illness.    All other systems reviewed and are negative.   Studies Reviewed EKG Interpretation Date/Time:  Tuesday November 29 2023 14:13:03 EDT Ventricular Rate:  73 PR Interval:  154 QRS Duration:  96 QT Interval:  394 QTC Calculation: 434 R Axis:   6  Text Interpretation: Normal sinus rhythm  Artifact noted in lead I, II No acute ST/T wave changes      Confirmed by [name] ([id number]) on 11/29/2023 2:28:42 PM    Cardiac Studies & Procedures   ______________________________________________________________________________________________ CARDIAC CATHETERIZATION  CARDIAC CATHETERIZATION 11/03/2022  Conclusion   Dist LAD lesion is 50% stenosed.   Mid LAD-2 lesion is 30% stenosed.   Prox LAD lesion is 30% stenosed.   Prox Cx to Dist Cx lesion is 30% stenosed.   Prox RCA to Mid RCA lesion is 100% stenosed.   Ost 2nd Diag lesion is 75% stenosed.   2nd Diag lesion is 70% stenosed.   1st Mrg lesion is 75% stenosed.   2nd Mrg lesion is 20% stenosed.   Mid LAD-1 lesion is 50% stenosed.  Stable multi-vessel CAD with no change since last cardiac cath. Patent mid LAD stent with moderate restenosis-unchanged. (Previous flow wire negative) Moderate disease in the small Diagonal branch and small obtuse  marginal branch-both too small for PCI. Chronic total occlusion of the proximal RCA. The mid and distal RCA and distal branches fill from left to right collaterals. RA 4, RV 34/2/3, PA 34/13 mean 20, PCWP 6, LVEDP 18 mmHg., AO 177/80 Uncontrolled HTN  Recommendations: Continue medical management of CAD. Aggressive approach to HTN control.  Findings Coronary Findings Diagnostic  Dominance: Co-dominant  Left Main Vessel is large.  Left Anterior Descending Vessel is large. The appearance of the LAD is unchanged.  There is moderate 50% in-stent restenosis, with no significant progression from the previous cath went there was a negative flow wire analysis. Prox LAD lesion is 30% stenosed. Mid LAD-1 lesion is 50% stenosed. The lesion was previously treated over 2 years ago. Previously placed stent displays restenosis. RFR = 0.91. Mid LAD-2 lesion is 30% stenosed. Dist LAD lesion is 50% stenosed.  First Diagonal Branch Vessel is small in size.  Second Diagonal [employer] 2nd Diag lesion is 75% stenosed. The lesion is eccentric. 2nd Diag lesion is 70% stenosed.  Ramus Intermedius Vessel is small.  Left Circumflex Vessel is large. The circumflex has mild diffuse plaquing without any high-grade stenoses besides the small OM branch. Prox Cx to Dist Cx lesion is 30% stenosed.  First Obtuse Marginal Branch Vessel is small in size. Unchanged from the previous cath study.  Small vessel.  Negative RFR at the time of the previous study. 1st Mrg lesion is 75% stenosed. RFR = 0.92.  Second  Obtuse Marginal Branch 2nd Mrg lesion is 20% stenosed.  Right Coronary Artery Vessel is moderate in size. The RCA is occluded proximally at the proximal stent edge, unchanged from the previous studies.  The distal RCA is collateralized from the left coronary artery. Prox RCA to Mid RCA lesion is 100% stenosed. The lesion was previously treated .  Right Posterior Descending Artery Collaterals RPDA  filled by collaterals from Dist LAD.  Intervention  No interventions have been documented.   CARDIAC CATHETERIZATION  CARDIAC CATHETERIZATION 01/22/2022  Conclusion Final conclusion: Stable multivessel coronary artery disease with chronic total occlusion of the proximal RCA collateralized from the left coronary artery, mild to moderate in-stent restenosis in the LAD (previous flow wire negative), moderately severe OM stenosis and a small OM branch (negative flow wire in past)  No change in the patient's coronary anatomy over multiple studies.  FloWire analysis has been done demonstrating no flow obstructive disease on past studies.  Recommend continued medical therapy.  Likely needs escalation of antihypertensive therapy as I suspect hypertension (supply/demand mismatch) is driving her symptoms.  Findings Coronary Findings Diagnostic  Dominance: Co-dominant  Left Main Vessel is large.  Left Anterior Descending Vessel is large. The appearance of the LAD is unchanged.  There is moderate 50% in-stent restenosis, with no significant progression from the previous cath went there was a negative flow wire analysis. Prox LAD lesion is 30% stenosed. Mid LAD-1 lesion is 50% stenosed. The lesion was previously treated over 2 years ago. Previously placed stent displays restenosis. RFR = 0.91. Mid LAD-2 lesion is 30% stenosed. Dist LAD lesion is 50% stenosed.  First Diagonal Branch Vessel is small in size.  Second Diagonal [employer] 2nd Diag lesion is 75% stenosed. The lesion is eccentric. 2nd Diag lesion is 70% stenosed.  Ramus Intermedius Vessel is small.  Left Circumflex Vessel is large. The circumflex has mild diffuse plaquing without any high-grade stenoses besides the small OM branch. Prox Cx to Dist Cx lesion is 30% stenosed.  First Obtuse Marginal Branch Vessel is small in size. Unchanged from the previous cath study.  Small vessel.  Negative RFR at the time of the previous  study. 1st Mrg lesion is 75% stenosed. RFR = 0.92.  Second Obtuse Marginal Branch 2nd Mrg lesion is 20% stenosed.  Right Coronary Artery Vessel is moderate in size. The RCA is occluded proximally at the proximal stent edge, unchanged from the previous studies.  The distal RCA is collateralized from the left coronary artery. Prox RCA to Mid RCA lesion is 100% stenosed. The lesion was previously treated .  Right Posterior Descending Artery Collaterals RPDA filled by collaterals from Dist LAD.  Intervention  No interventions have been documented.   STRESS TESTS  MYOCARDIAL PERFUSION IMAGING 03/15/2014   ECHOCARDIOGRAM  ECHOCARDIOGRAM COMPLETE 05/27/2023  Narrative ECHOCARDIOGRAM REPORT    Patient Name:   ANGELIS GATES Date of Exam: 05/27/2023 Medical Rec #:  [id number]       Height:       64.0 in Accession #:    [id number]      Weight:       150.0 lb Date of Birth:  04-Jan-1966      BSA:          1.731 m Patient Age:    57 years        BP:           168/89 mmHg Patient Gender: F  HR:           58 bpm. Exam Location:  [facility]  Procedure: 2D Echo, 3D Echo, Cardiac Doppler, Color Doppler and Strain Analysis (Both Spectral and Color Flow Doppler were utilized during procedure).  Indications:    R06.02 SOB  History:        Patient has prior history of Echocardiogram examinations, most recent 08/18/2022. CAD, Aortic Valve Disease, Signs/Symptoms:Syncope and Chest Pain; Risk Factors:Current Smoker, Dyslipidemia and Hypertension. Cancer.  Sonographer:    Ellouise Mose RDCS Referring Phys: [id number] KATLYN D WEST  IMPRESSIONS   1. Left ventricular ejection fraction, by estimation, is 55 to 60%. Left ventricular ejection fraction by 3D volume is 58 %. The left ventricle has normal function. The left ventricle has no regional wall motion abnormalities. There is mild left ventricular hypertrophy. Left ventricular diastolic parameters are consistent with Grade I  diastolic dysfunction (impaired relaxation). Elevated left ventricular end-diastolic pressure. 2. Right ventricular systolic function is normal. The right ventricular size is mildly enlarged. Tricuspid regurgitation signal is inadequate for assessing PA pressure. 3. Mitral signal corrupted by AI flow. The mitral valve is normal in structure. Trivial mitral valve regurgitation. No evidence of mitral stenosis. 4. The aortic valve is tricuspid. Aortic valve regurgitation is moderate to severe. Jet is centrally directed and eccentric towards the ventricular surface of anterior mitral valve leaflet. VC 0.8 cm. LVIDd 5 cm and LVIDs 3.1 cm. No aortic stenosis is present. 5. Aortic dilatation noted. There is borderline dilatation of the ascending aorta, measuring 39 mm.  Comparison(s): Changes from prior study are noted. AI is moderate to severe. VC 0.8 cm consistent with severe AI but PHT 678 msec consistent with mild AI.  FINDINGS Left Ventricle: Left ventricular ejection fraction, by estimation, is 55 to 60%. Left ventricular ejection fraction by 3D volume is 58 %. The left ventricle has normal function. The left ventricle has no regional wall motion abnormalities. Strain imaging was not performed. The left ventricular internal cavity size was normal in size. There is mild left ventricular hypertrophy. Left ventricular diastolic parameters are consistent with Grade I diastolic dysfunction (impaired relaxation). Elevated left ventricular end-diastolic pressure.  Right Ventricle: The right ventricular size is mildly enlarged. No increase in right ventricular wall thickness. Right ventricular systolic function is normal. Tricuspid regurgitation signal is inadequate for assessing PA pressure.  Left Atrium: Left atrial size was normal in size.  Right Atrium: Right atrial size was normal in size.  Pericardium: There is no evidence of pericardial effusion.  Mitral Valve: Mitral signal corrupted by AI flow.  The mitral valve is normal in structure. Trivial mitral valve regurgitation. No evidence of mitral valve stenosis.  Tricuspid Valve: The tricuspid valve is normal in structure. Tricuspid valve regurgitation is not demonstrated. No evidence of tricuspid stenosis.  Aortic Valve: The aortic valve is tricuspid. Aortic valve regurgitation is moderate to severe. Aortic regurgitation PHT measures 678 msec. No aortic stenosis is present. Aortic valve mean gradient measures 5.0 mmHg. Aortic valve peak gradient measures 9.5 mmHg. Aortic valve area, by VTI measures 3.76 cm.  Pulmonic Valve: The pulmonic valve was normal in structure. Pulmonic valve regurgitation is not visualized. No evidence of pulmonic stenosis.  Aorta: The aortic root is normal in size and structure and aortic dilatation noted. There is borderline dilatation of the ascending aorta, measuring 39 mm.  IAS/Shunts: No atrial level shunt detected by color flow Doppler.  Additional Comments: 3D was performed not requiring image post processing on an independent  workstation and was normal.   LEFT VENTRICLE PLAX 2D LVIDd:         5.00 cm         Diastology LVIDs:         3.30 cm         LV e' medial:    3.81 cm/s LV PW:         1.00 cm         LV E/e' medial:  19.5 LV IVS:        1.30 cm         LV e' lateral:   9.79 cm/s LVOT diam:     2.40 cm         LV E/e' lateral: 7.6 LV SV:         133 LV SV Index:   77 LVOT Area:     4.52 cm        3D Volume EF LV 3D EF:    Left ventricul LV Volumes (MOD)                            ar LV vol d, MOD    125.0 ml                   ejection A2C:                                        fraction LV vol d, MOD    119.0 ml                   by 3D A4C:                                        volume is LV vol s, MOD    53.4 ml                    58 %. A2C: LV vol s, MOD    49.7 ml A4C:                           3D Volume EF: LV SV MOD A2C:   71.6 ml       3D EF:        58 % LV SV MOD A4C:   119.0  ml      LV EDV:       241 ml LV SV MOD BP:    74.0 ml       LV ESV:       102 ml LV SV:        139 ml  RIGHT VENTRICLE            IVC RV S prime:     9.03 cm/s  IVC diam: 2.00 cm TAPSE (M-mode): 1.6 cm  LEFT ATRIUM             Index        RIGHT ATRIUM           Index LA diam:        2.40 cm 1.39 cm/m   RA Area:     16.00 cm LA Vol (A2C):   60.7 ml 35.06 ml/m  RA  Volume:   47.20 ml  27.26 ml/m LA Vol (A4C):   43.2 ml 24.95 ml/m LA Biplane Vol: 55.7 ml 32.17 ml/m AORTIC VALVE AV Area (Vmax):    3.76 cm AV Area (Vmean):   3.90 cm AV Area (VTI):     3.76 cm AV Vmax:           154.00 cm/s AV Vmean:          96.900 cm/s AV VTI:            0.354 m AV Peak Grad:      9.5 mmHg AV Mean Grad:      5.0 mmHg LVOT Vmax:         128.00 cm/s LVOT Vmean:        83.600 cm/s LVOT VTI:          0.294 m LVOT/AV VTI ratio: 0.83 AI PHT:            678 msec  AORTA Ao Root diam: 3.60 cm Ao Asc diam:  3.90 cm  MITRAL VALVE MV Area (PHT): 3.91 cm    SHUNTS MV Decel Time: 194 msec    Systemic VTI:  0.29 m MV E velocity: 74.40 cm/s  Systemic Diam: 2.40 cm MV A velocity: 59.50 cm/s MV E/A ratio:  1.25  Vishnu Priya Mallipeddi Electronically signed by Diannah Late Mallipeddi Signature Date/Time: 05/27/2023/3:17:46 PM    Final    MONITORS  LONG TERM MONITOR-LIVE TELEMETRY (3-14 DAYS) 08/31/2022  Narrative 13 Day Zio Monitor  Quality: Fair.  Baseline artifact. Predominant rhythm: sinus rhythm Average heart rate: 71 bpm Max heart rate: 120 bpm Min heart rate: 44 bpm Pauses >2.5 seconds: none  7 episodes of SVT lasting up to 12 beats Rare (<1%) PACs and PVCs Rare ventricular bigeminy  Tiffany C. Raford, MD, [facility] 10/01/2022 9:11 AM       ______________________________________________________________________________________________      Risk Assessment/Calculations      STOP-Bang Score:         Physical Exam VS:  BP (!) 152/100 (BP Location: Left Arm, Patient  Position: Sitting, Cuff Size: Normal)   Pulse 73   Resp 16   Ht 5' 4 (1.626 m)   Wt 149 lb 12.8 oz (67.9 kg)   SpO2 97%   BMI 25.71 kg/m        Wt Readings from Last 3 Encounters:  11/29/23 149 lb 12.8 oz (67.9 kg)  09/15/23 151 lb 6.4 oz (68.7 kg)  08/10/23 149 lb 9.6 oz (67.9 kg)    GEN: Well nourished, well developed in no acute distress NECK: No JVD; No carotid bruits CARDIAC: RRR, no murmurs, rubs, gallops RESPIRATORY:  Clear to auscultation without rales, wheezing or rhonchi  ABDOMEN: Soft, non-tender, non-distended EXTREMITIES:  No edema; No deformity   ASSESSMENT AND PLAN HTN - BP elevated in clinic and at home.  Prior secondary workup unremarkable: normal catecholamines, metanephrines, cortisol unremarkable. 10/2021 normal adrenal glands.  Discussed to monitor BP at home at least 2 hours after medications and sitting for 5-10 minutes.  Continue Coreg  12.5mg  BID, Nifedipine  60mg  daily. Multiple antihypertensive intolerances, as above.  Increase Imdur  to 60mg  daily as below for angina.   Hypothyroidism - Continue to follow with PCP.   CAD /  HLD, LDL goal <55 - Prior DES RCA 2013, DES LAD 2014. Most recent cath 10/2022 was stable. One month history of chest pain with exertion and also chest pain while sleeping concerning for unstable angina. EKG today  no acute ST/T wave changes.  Increase Imdur  to 60mg  daily for antianginal benefit.  Due to concern for unstable angina as chest pain with exertion and also waking her from sleep, set up for LHC with Dr. Darron 12/01/23.  Due to element of dyspnea and known moderate AI, plan for RHC.  GDMT Aspirin  81mg  daily, Plavix  75mg  daily, Coreg  12.5mg  BID, Imdur  30mg  daily, Repatha140mg  q2 weeks. Did not tolerate statin (Atorvastatin , Rosuvastatin). 06/02/23 LDL 62. Could consider Zetia at follow up.  Heart healthy diet and regular cardiovascular exercise encouraged.     Palpitations - Prior monitor PSVT. Continue Carvedilol  12.5mg  BID.  Denies palpitations.   Hx of CVA - Continue Repatha 140mg  q14 days, aspirin  81mg  daily, plavix  75 mg daily.   Moderate AI - Due to element of progressive dyspnea on exertion over last month, plan for RHC with LHC as above.   COPD / Tobacco use - Not interested in quitting at thist ime. No wheeze, cough. Continue present inhalers per PCP. Smoking cessation encouraged. Recommend utilization of [id number].     Informed Consent   Shared Decision Making/Informed Consent The risks [stroke (1 in 1000), death (1 in 1000), kidney failure [usually temporary] (1 in 500), bleeding (1 in 200), allergic reaction [possibly serious] (1 in 200)], benefits (diagnostic support and management of coronary artery disease) and alternatives of a cardiac catheterization were discussed in detail with Ms. Lucchetti and she is willing to proceed.     Dispo: follow up 2-3 weeks after LHC  Signed, Reche GORMAN Finder, NP

## 2023-11-29 NOTE — Patient Instructions (Addendum)
 Medication Instructions:   CHANGE Isosorbide  Mononitrate to 60mg  daily  TAKE PREDNISONE  20 MG 1 tablet 13 hours prior to test, then one 7 hours prior to test, and then one BEFORE YOU LEAVE FOR CATH TAKE BENADRYL 50 MG WITH THE LAST DOSE OF PREDNISONE    *If you need a refill on your cardiac medications before your next appointment, please call your pharmacy*  Lab Work: Your physician recommends that you return for lab work today: BMP, CBC  If you have labs (blood work) drawn today and your tests are completely normal, you will receive your results only by: MyChart Message (if you have MyChart) OR A paper copy in the mail If you have any lab test that is abnormal or we need to change your treatment, we will call you to review the results.  Testing/Procedures: Your provider has recommended cardiac catheterization.   Follow-Up: At [facility], you and your health needs are our priority.  As part of our continuing mission to provide you with exceptional heart care, our providers are all part of one team.  This team includes your primary Cardiologist (physician) and Advanced Practice Providers or APPs (Physician Assistants and Nurse Practitioners) who all work together to provide you with the care you need, when you need it.  Your next appointment:   2-3 weeks after cardiac catheterization with Dr. Mona or Advanced Practice Provider  We recommend signing up for the patient portal called MyChart.  Sign up information is provided on this After Visit Summary.  MyChart is used to connect with patients for Virtual Visits (Telemedicine).  Patients are able to view lab/test results, encounter notes, upcoming appointments, etc.  Non-urgent messages can be sent to your provider as well.   To learn more about what you can do with MyChart, go to [email or web address].   Other Instructions  [facility] V[facility]ULAR AT [facility] [address] [place] [address] Dept: [phone]  Bethany Wang  11/29/2023  You are scheduled for a Cardiac Catheterization on Thursday, August 21 with Dr. Deatrice Cage.  1. Please arrive at the [facility] (Main Entrance A) at [facility]: [address] [place], [place] [address] at 10:00 AM (This time is 2 hour(s) before your procedure to ensure your preparation).   Free valet parking service is available. You will check in at ADMITTING. The support person will be asked to wait in the waiting room.  It is OK to have someone drop you off and come back when you are ready to be discharged.    Special note: Every effort is made to have your procedure done on time. Please understand that emergencies sometimes delay scheduled procedures.  2. Diet: No solid foods after midnight. You may have clear liquids until you arrive at the hospital.  List of approved liquids water , clear juice, clear tea, black coffee, fruit juices, non-citric and without pulp, carbonated beverages, Gatorade, Kool -Aid, plain Jello-O and plain ice popsicles.  3. Hydration: You need to be well hydrated before your procedure time. You may drink approved liquids (see below) until you arrive at the hospital. On the way to the hospital, please drink a 16-oz (1 plastic bottle) of water .   List of approved liquids water , clear juice, clear tea, black coffee, fruit juices, non-citric and without pulp, carbonated beverages, Gatorade, Kool -Aid, plain Jello-O and plain ice popsicles.  4. Labs: You will need to have blood drawn on today  5. Medication instructions in preparation for your procedure:   Contrast Allergy: Yes TAKE PREDNISONE  20 MG 1 tablet 13 hours prior to test, then one 7 hours prior to test, and then one BEFORE YOU LEAVE FOR CATH TAKE BENADRYL 50 MG WITH THE LAST DOSE OF PREDNISONE    On the morning of your procedure, take your ASPIRIN   and any morning medicines NOT listed  above.  You may use sips of water .  6. Plan to go home the same day, you will only stay overnight if medically necessary. 7. Bring a current list of your medications and current insurance cards. 8. You MUST have a responsible person to drive you home. 9. Someone MUST be with you the first 24 hours after you arrive home or your discharge will be delayed. 10. Please wear clothes that are easy to get on and off and wear slip-on shoes.  Thank you for allowing us  to care for you!   -- [facility]

## 2023-11-30 ENCOUNTER — Ambulatory Visit (HOSPITAL_BASED_OUTPATIENT_CLINIC_OR_DEPARTMENT_OTHER): Payer: Self-pay | Admitting: Family

## 2023-11-30 LAB — CBC WITH DIFFERENTIAL/PLATELET
Basophils Absolute: 0 x10E3/uL (ref 0.0–0.2)
Basos: 1 %
EOS (ABSOLUTE): 0.1 x10E3/uL (ref 0.0–0.4)
Eos: 1 %
Hematocrit: 43.7 % (ref 34.0–46.6)
Hemoglobin: 13.7 g/dL (ref 11.1–15.9)
Immature Grans (Abs): 0 x10E3/uL (ref 0.0–0.1)
Immature Granulocytes: 0 %
Lymphocytes Absolute: 4.9 x10E3/uL — ABNORMAL HIGH (ref 0.7–3.1)
Lymphs: 66 %
MCH: 27.7 pg (ref 26.6–33.0)
MCHC: 31.4 g/dL — ABNORMAL LOW (ref 31.5–35.7)
MCV: 89 fL (ref 79–97)
Monocytes Absolute: 0.5 x10E3/uL (ref 0.1–0.9)
Monocytes: 7 %
Neutrophils Absolute: 1.8 x10E3/uL (ref 1.4–7.0)
Neutrophils: 25 %
Platelets: 223 x10E3/uL (ref 150–450)
RBC: 4.94 x10E6/uL (ref 3.77–5.28)
RDW: 13.1 % (ref 11.7–15.4)
WBC: 7.3 x10E3/uL (ref 3.4–10.8)

## 2023-11-30 LAB — BASIC METABOLIC PANEL WITH GFR
BUN/Creatinine Ratio: 9 (ref 9–23)
BUN: 7 mg/dL (ref 6–24)
CO2: 23 mmol/L (ref 20–29)
Calcium: 10 mg/dL (ref 8.7–10.2)
Chloride: 101 mmol/L (ref 96–106)
Creatinine, Ser: 0.81 mg/dL (ref 0.57–1.00)
Glucose: 91 mg/dL (ref 70–99)
Potassium: 4.1 mmol/L (ref 3.5–5.2)
Sodium: 139 mmol/L (ref 134–144)
eGFR: 85 mL/min/1.73 (ref >59)

## 2023-12-01 ENCOUNTER — Other Ambulatory Visit: Payer: Self-pay

## 2023-12-01 ENCOUNTER — Ambulatory Visit (HOSPITAL_COMMUNITY)
Admission: RE | Admit: 2023-12-01 | Discharge: 2023-12-01 | Disposition: A | Discharge status: Home / Self Care | Attending: Cardiovascular Disease | Admitting: Cardiovascular Disease

## 2023-12-01 ENCOUNTER — Encounter (HOSPITAL_COMMUNITY)
Admission: RE | Disposition: A | Discharge status: Home / Self Care | Payer: Self-pay | Source: Home / Self Care | Attending: Cardiovascular Disease

## 2023-12-01 DIAGNOSIS — E119 Type 2 diabetes mellitus without complications: Secondary | ICD-10-CM | POA: Insufficient documentation

## 2023-12-01 DIAGNOSIS — G4730 Sleep apnea, unspecified: Secondary | ICD-10-CM | POA: Insufficient documentation

## 2023-12-01 DIAGNOSIS — J449 Chronic obstructive pulmonary disease, unspecified: Secondary | ICD-10-CM | POA: Diagnosis not present

## 2023-12-01 DIAGNOSIS — Y832 Surgical operation with anastomosis, bypass or graft as the cause of abnormal reaction of the patient, or of later complication, without mention of misadventure at the time of the procedure: Secondary | ICD-10-CM | POA: Insufficient documentation

## 2023-12-01 DIAGNOSIS — I5032 Chronic diastolic (congestive) heart failure: Secondary | ICD-10-CM | POA: Diagnosis not present

## 2023-12-01 DIAGNOSIS — I2582 Chronic total occlusion of coronary artery: Secondary | ICD-10-CM | POA: Insufficient documentation

## 2023-12-01 DIAGNOSIS — I2510 Atherosclerotic heart disease of native coronary artery without angina pectoris: Secondary | ICD-10-CM | POA: Diagnosis not present

## 2023-12-01 DIAGNOSIS — Z7902 Long term (current) use of antithrombotics/antiplatelets: Secondary | ICD-10-CM | POA: Diagnosis not present

## 2023-12-01 DIAGNOSIS — I110 Hypertensive heart disease with heart failure: Secondary | ICD-10-CM | POA: Insufficient documentation

## 2023-12-01 DIAGNOSIS — E785 Hyperlipidemia, unspecified: Secondary | ICD-10-CM | POA: Insufficient documentation

## 2023-12-01 DIAGNOSIS — I25118 Atherosclerotic heart disease of native coronary artery with other forms of angina pectoris: Secondary | ICD-10-CM | POA: Insufficient documentation

## 2023-12-01 DIAGNOSIS — I471 Supraventricular tachycardia, unspecified: Secondary | ICD-10-CM | POA: Insufficient documentation

## 2023-12-01 DIAGNOSIS — Z7982 Long term (current) use of aspirin: Secondary | ICD-10-CM | POA: Insufficient documentation

## 2023-12-01 DIAGNOSIS — I25110 Atherosclerotic heart disease of native coronary artery with unstable angina pectoris: Secondary | ICD-10-CM

## 2023-12-01 DIAGNOSIS — I351 Nonrheumatic aortic (valve) insufficiency: Secondary | ICD-10-CM | POA: Diagnosis not present

## 2023-12-01 DIAGNOSIS — T82855A Stenosis of coronary artery stent, initial encounter: Secondary | ICD-10-CM | POA: Diagnosis not present

## 2023-12-01 DIAGNOSIS — Z79899 Other long term (current) drug therapy: Secondary | ICD-10-CM | POA: Insufficient documentation

## 2023-12-01 DIAGNOSIS — Z8673 Personal history of transient ischemic attack (TIA), and cerebral infarction without residual deficits: Secondary | ICD-10-CM | POA: Diagnosis not present

## 2023-12-01 DIAGNOSIS — E039 Hypothyroidism, unspecified: Secondary | ICD-10-CM | POA: Insufficient documentation

## 2023-12-01 HISTORY — PX: RIGHT/LEFT HEART CATH AND CORONARY ANGIOGRAPHY: CATH118266

## 2023-12-01 LAB — POCT I-STAT EG7
Acid-base deficit: 1 mmol/L (ref 0.0–2.0)
Bicarbonate: 23.7 mmol/L (ref 20.0–28.0)
Calcium, Ion: 1.3 mmol/L (ref 1.15–1.40)
HCT: 40 % (ref 36.0–46.0)
Hemoglobin: 13.6 g/dL (ref 12.0–15.0)
O2 Saturation: 68 %
Potassium: 3.7 mmol/L (ref 3.5–5.1)
Sodium: 141 mmol/L (ref 135–145)
TCO2: 25 mmol/L (ref 22–32)
pCO2, Ven: 39.9 mmHg — ABNORMAL LOW (ref 44–60)
pH, Ven: 7.381 (ref 7.25–7.43)
pO2, Ven: 36 mmHg (ref 32–45)

## 2023-12-01 LAB — GLUCOSE, CAPILLARY: Glucose-Capillary: 151 mg/dL — ABNORMAL HIGH (ref 70–99)

## 2023-12-01 LAB — POCT I-STAT 7, (LYTES, BLD GAS, ICA,H+H)
Acid-base deficit: 2 mmol/L (ref 0.0–2.0)
Bicarbonate: 22.4 mmol/L (ref 20.0–28.0)
Calcium, Ion: 1.31 mmol/L (ref 1.15–1.40)
HCT: 40 % (ref 36.0–46.0)
Hemoglobin: 13.6 g/dL (ref 12.0–15.0)
O2 Saturation: 91 %
Potassium: 3.7 mmol/L (ref 3.5–5.1)
Sodium: 140 mmol/L (ref 135–145)
TCO2: 23 mmol/L (ref 22–32)
pCO2 arterial: 36.8 mmHg (ref 32–48)
pH, Arterial: 7.392 (ref 7.35–7.45)
pO2, Arterial: 61 mmHg — ABNORMAL LOW (ref 83–108)

## 2023-12-01 SURGERY — RIGHT/LEFT HEART CATH AND CORONARY ANGIOGRAPHY
Anesthesia: LOCAL

## 2023-12-01 MED ORDER — VERAPAMIL HCL 2.5 MG/ML IV SOLN
INTRAVENOUS | Status: AC
  Filled 2023-12-01: qty 2

## 2023-12-01 MED ORDER — ASPIRIN 81 MG PO CHEW: 81.0000 mg | CHEWABLE_TABLET | ORAL | Status: DC

## 2023-12-01 MED ORDER — SODIUM CHLORIDE 0.9% FLUSH: 3.0000 mL | INTRAVENOUS | Status: DC | PRN

## 2023-12-01 MED ORDER — LIDOCAINE HCL (PF) 1 % IJ SOLN
INTRAMUSCULAR | Status: AC
  Filled 2023-12-01: qty 30

## 2023-12-01 MED ORDER — HEPARIN SODIUM (PORCINE) 1000 UNIT/ML IJ SOLN
INTRAMUSCULAR | Status: DC | PRN
  Administered 2023-12-01: 3500 [IU] via INTRAVENOUS

## 2023-12-01 MED ORDER — VERAPAMIL HCL 2.5 MG/ML IV SOLN
INTRAVENOUS | Status: DC | PRN
  Administered 2023-12-01: 10 mL via INTRA_ARTERIAL

## 2023-12-01 MED ORDER — FENTANYL CITRATE (PF) 100 MCG/2ML IJ SOLN
INTRAMUSCULAR | Status: DC | PRN
  Administered 2023-12-01: 50 ug via INTRAVENOUS

## 2023-12-01 MED ORDER — FENTANYL CITRATE (PF) 100 MCG/2ML IJ SOLN
INTRAMUSCULAR | Status: AC
  Filled 2023-12-01: qty 2

## 2023-12-01 MED ORDER — IOHEXOL 350 MG/ML SOLN
INTRAVENOUS | Status: DC | PRN
  Administered 2023-12-01: 65 mL

## 2023-12-01 MED ORDER — HEPARIN (PORCINE) IN NACL 1000-0.9 UT/500ML-% IV SOLN
INTRAVENOUS | Status: DC | PRN
  Administered 2023-12-01: 1000 mL

## 2023-12-01 MED ORDER — HEPARIN SODIUM (PORCINE) 1000 UNIT/ML IJ SOLN
INTRAMUSCULAR | Status: AC
  Filled 2023-12-01: qty 10

## 2023-12-01 MED ORDER — MIDAZOLAM HCL 2 MG/2ML IJ SOLN
INTRAMUSCULAR | Status: DC | PRN
  Administered 2023-12-01 (×2): 1 mg via INTRAVENOUS

## 2023-12-01 MED ORDER — FREE WATER: 500.0000 mL | Freq: Once | Status: DC

## 2023-12-01 MED ORDER — SODIUM CHLORIDE 0.9 % IV SOLN: 250.0000 mL | INTRAVENOUS | Status: DC | PRN

## 2023-12-01 MED ORDER — MIDAZOLAM HCL 2 MG/2ML IJ SOLN
INTRAMUSCULAR | Status: AC
  Filled 2023-12-01: qty 2

## 2023-12-01 MED ORDER — LIDOCAINE HCL (PF) 1 % IJ SOLN
INTRAMUSCULAR | Status: DC | PRN
  Administered 2023-12-01 (×2): 2 mL

## 2023-12-01 MED ORDER — SODIUM CHLORIDE 0.9% FLUSH: 3.0000 mL | Freq: Two times a day (BID) | INTRAVENOUS | Status: DC

## 2023-12-01 SURGICAL SUPPLY — 13 items
CATH BALLN WEDGE 5F 110CM (CATHETERS) IMPLANT
CATH INFINITI 5FR JL4 (CATHETERS) IMPLANT
CATH INFINITI AMBI 5FR JK (CATHETERS) IMPLANT
CATH INFINITI JR4 5F (CATHETERS) IMPLANT
DEVICE RAD COMP TR BAND LRG (VASCULAR PRODUCTS) IMPLANT
ELECT DEFIB PAD ADLT CADENCE (PAD) IMPLANT
GLIDESHEATH SLEND SS 6F .021 (SHEATH) IMPLANT
GUIDEWIRE INQWIRE 1.5J.035X260 (WIRE) IMPLANT
KIT ENCORE 26 ADVANTAGE (KITS) IMPLANT
KIT SINGLE USE MANIFOLD (KITS) IMPLANT
PACK CARDIAC CATHETERIZATION (CUSTOM PROCEDURE TRAY) ×1 IMPLANT
SET ATX-X65L (MISCELLANEOUS) IMPLANT
SHEATH GLIDE SLENDER 4/5FR (SHEATH) IMPLANT

## 2023-12-01 NOTE — Progress Notes (Signed)
   Called to bedside with patient wanting to leave AMA after cardiac cath. Insistent that she will not stay the appropriate amount of time post cath to ensure radial cath site stable. Dr. Darron spoke with patient as well. She again refused to stay. Was able to deflate and remove TR band faster than normal time without acute bleeding noted. She will sign AMA form and bleeding site care instructions given by RN. Instructed to return to the ED if radial site bleeding occurs.   SignedManuelita Rummer, NP-C 12/01/2023, 2:19 PM Pager: [phone]

## 2023-12-01 NOTE — Discharge Instructions (Signed)

## 2023-12-01 NOTE — Progress Notes (Signed)
 TR band removed. No bleeding or hematoma noted. 4x4 pressure dressing applied to site. Post activity and precautions explained to patient. Patient remains persistent about going home. AMA papers obtained. IV removed intact. Post care instructions provided. Patient left hospital with husband.Bethany Wang

## 2023-12-01 NOTE — Progress Notes (Signed)
 Called to procedural short stay due to patient demanding to leave. Patient is status post diagnostic heart catheterization. Dr. Darron and Manuelita, PA at bedside.  Bleeding and safety precautions explained to patient. Patient was informed that she needed to stay to have appropriate monitoring of arterial site post procedure. Patient has refused. Patient still demanding to go home.  Patient agreed to allow me to removed TR band off safety prior to leaving. AMA papers will be obtained.Delyla Sandeen E

## 2023-12-01 NOTE — Addendum Note (Signed)
 Addended by: Kamareon Sciandra S on: 12/01/2023 07:45 AM   Modules accepted: Orders

## 2023-12-01 NOTE — Interval H&P Note (Signed)
 History and Physical Interval Note:  12/01/2023 12:25 PM  Bethany Wang  has presented today for surgery, with the diagnosis of angina.  The various methods of treatment have been discussed with the patient and family. After consideration of risks, benefits and other options for treatment, the patient has consented to  Procedure(s): RIGHT/LEFT HEART CATH AND CORONARY ANGIOGRAPHY (N/A) as a surgical intervention.  The patient's history has been reviewed, patient examined, no change in status, stable for surgery.  I have reviewed the patient's chart and labs.  Questions were answered to the patient's satisfaction.     Davis Vannatter

## 2023-12-02 ENCOUNTER — Encounter (HOSPITAL_COMMUNITY): Payer: Self-pay | Admitting: Cardiovascular Disease

## 2023-12-02 DIAGNOSIS — I25119 Atherosclerotic heart disease of native coronary artery with unspecified angina pectoris: Secondary | ICD-10-CM | POA: Diagnosis not present

## 2023-12-06 ENCOUNTER — Encounter (HOSPITAL_COMMUNITY): Payer: Self-pay | Admitting: Gastroenterology

## 2023-12-06 ENCOUNTER — Ambulatory Visit: Payer: Self-pay | Admitting: Gastroenterology

## 2023-12-06 ENCOUNTER — Telehealth: Payer: Self-pay | Admitting: Gastroenterology

## 2023-12-06 DIAGNOSIS — K2270 Barrett's esophagus without dysplasia: Secondary | ICD-10-CM | POA: Diagnosis not present

## 2023-12-06 NOTE — Telephone Encounter (Signed)
 Received the result of TissueCypher, which showed no risk of progression of Barrett's esophagus.  Will continue with same interval frequency for endoscopy as prior.

## 2023-12-09 DIAGNOSIS — R079 Chest pain, unspecified: Secondary | ICD-10-CM | POA: Diagnosis not present

## 2023-12-09 DIAGNOSIS — R053 Chronic cough: Secondary | ICD-10-CM | POA: Diagnosis not present

## 2023-12-09 DIAGNOSIS — I2510 Atherosclerotic heart disease of native coronary artery without angina pectoris: Secondary | ICD-10-CM | POA: Diagnosis not present

## 2023-12-09 DIAGNOSIS — Z6824 Body mass index (BMI) 24.0-24.9, adult: Secondary | ICD-10-CM | POA: Diagnosis not present

## 2023-12-09 DIAGNOSIS — I351 Nonrheumatic aortic (valve) insufficiency: Secondary | ICD-10-CM | POA: Diagnosis not present

## 2023-12-19 ENCOUNTER — Encounter (HOSPITAL_BASED_OUTPATIENT_CLINIC_OR_DEPARTMENT_OTHER): Payer: Self-pay | Admitting: Family

## 2023-12-19 ENCOUNTER — Ambulatory Visit (INDEPENDENT_AMBULATORY_CARE_PROVIDER_SITE_OTHER): Admitting: Family

## 2023-12-19 ENCOUNTER — Telehealth (INDEPENDENT_AMBULATORY_CARE_PROVIDER_SITE_OTHER): Payer: Self-pay

## 2023-12-19 VITALS — BP 148/98 | HR 82 | Ht 64.0 in | Wt 152.0 lb

## 2023-12-19 DIAGNOSIS — I2510 Atherosclerotic heart disease of native coronary artery without angina pectoris: Secondary | ICD-10-CM | POA: Diagnosis not present

## 2023-12-19 DIAGNOSIS — E785 Hyperlipidemia, unspecified: Secondary | ICD-10-CM | POA: Diagnosis not present

## 2023-12-19 DIAGNOSIS — I10 Essential (primary) hypertension: Secondary | ICD-10-CM | POA: Diagnosis not present

## 2023-12-19 DIAGNOSIS — Z01818 Encounter for other preprocedural examination: Secondary | ICD-10-CM | POA: Diagnosis not present

## 2023-12-19 DIAGNOSIS — Z8673 Personal history of transient ischemic attack (TIA), and cerebral infarction without residual deficits: Secondary | ICD-10-CM

## 2023-12-19 NOTE — Patient Instructions (Addendum)
 Medication Instructions:  Continue your current medications.  *If you need a refill on your cardiac medications before your next appointment, please call your pharmacy*  Follow-Up: At [facility], you and your health needs are our priority.  As part of our continuing mission to provide you with exceptional heart care, our providers are all part of one team.  This team includes your primary Cardiologist (physician) and Advanced Practice Providers or APPs (Physician Assistants and Nurse Practitioners) who all work together to provide you with the care you need, when you need it.  Your next appointment:   6 month(s)  Provider:   K. Italy Hilty, MD or Reche Finder, NP    We recommend signing up for the patient portal called MyChart.  Sign up information is provided on this After Visit Summary.  MyChart is used to connect with patients for Virtual Visits (Telemedicine).  Patients are able to view lab/test results, encounter notes, upcoming appointments, etc.  Non-urgent messages can be sent to your provider as well.   To learn more about what you can do with MyChart, go to [email or web address].   Other Instructions  If your blood pressure at home is consistently more than 130/80 (for example, if it is more then 130/80 for 5 days in a row), let us  know and we may consider adjusting your blood pressure medications.   Heart Healthy Diet Recommendations: A low-salt diet is recommended. Meats should be grilled, baked, or boiled. Avoid fried foods. Focus on lean protein sources like fish or chicken with vegetables and fruits. The American Heart Association is a Chief Technology Officer!  American Heart Association Diet and Lifeystyle Recommendations   Exercise recommendations: The American Heart Association recommends 150 minutes of moderate intensity exercise weekly. Try 30 minutes of moderate intensity exercise 4-5 times per week. This could include walking, jogging, or swimming.  Keep up the  great work with your walking!

## 2023-12-19 NOTE — Telephone Encounter (Signed)
 Bethany Angelia Sieving, MD  [name], NP; [name], Lionel RAMAN, CMA; [name], LPN; [name], CMA Thank you [place].  Hi [name]/[name]/[name],  Can you please schedule a colonoscopy? Dx: History of colon polyps. Room: 3.  Please ask her to hold Plavix  5 days prior.  Thanks,  [name], MD Gastroenterology and Hepatology [facility]       Called pt to schedule, no answer. Lvm.

## 2023-12-19 NOTE — Progress Notes (Signed)
 Cardiology Office Note   Date:  12/19/2023  ID:  Bethany Wang, DOB 08-20-1965, MRN [id number] PCP: Trudy Vaughn FALCON, MD  [facility] Providers Cardiologist:  Vinie JAYSON Maxcy, MD     History of Present Illness Bethany Wang is a 58 y.o. female with a hx of CAD (s/p DES to RCA 2013, DES to LAD 2014, cath 04/2016 and 10/2020 and 01/2022 patent LAD stent with jailed D2 by LAD stent and CTO RCA to L-R collaterals), HTN, DM2  Hospitalized July 2020 with chest pain with LHC with occlusion of RCA with previously placed coronary stent and left-to-right collaterals.  50% ISR of proximal LAD and 90% diagonal ostial narrowing.  Also 60 to 70% obtuse marginal narrowing of 50% distal circumflex disease.  Recommended for medical therapy.  Intolerant to Atorvastatin , Rosuvastatin and Repatha initiated. Repeat catheterization 10/2020, 03/2021 stable compared top previous with recommendation for medical management. Imdur  adjusted to 60mg  BID. Trialed on Ranolazine  but developed rash and discontinued.  Multiple interruptions in medical therapy.   Saw Dr. Maxcy 01/20/22 for worsening angina with [facility] 01/22/22 revealing stable multivessel coronary disease. Medical therapy recommended.  Also suggested escalation of antihypertensive therapy as suspected supply/demand mismatch driving symptoms.   At visit 05/31/22 Chlorthalidone  transitioned to Spironolactone , but did not tolerate with nausea.. At visit 07/01/22 Doxazosin  2mg  at bedtime initiated, has not been continued.   At visit 07/2022 multiple episdoes of syncope also concerning for seizure. Referred to neurology. Monitor with episodes of PSVT, Carvedilol  switched to Metoprolol  100mg  BID. Echo 07/2022 normal LVEF, moderate to severe AI.She saw Dr. Maxcy 10/01/22 and due to progressive chest pain 10/29/22, LHC 11/03/22 vessel CAD with no change, stable to last cardiac catheterization.    Several medication changes since that time: 02/2023 and was not taking  several medications for past week.  Amlodipine  and carvedilol  were resumed.   03/21/2023 nephrology transitioned amlodipine  nifedipine  30 mg daily.  Carvedilol  increased to 12.5 mg twice daily.  04/25/23 nifedipine  increased to 60 mg daily.   05/12/2023 noted occasional chest discomfort and elevated blood pressure.  Imdur  30 mg daily initiated.    Echo updated 05/27/2023 with LVEF 55-60%, mild LVH,gr1dd, trivial MR, moderate AI by Dr. Tyrone review.  Admitted 06/02/23 at [facility] in [place] with nitroglycerin  drip in ED did not significantly improve pain. Imdur  added. Significant social stressors were noted including loss of multiple family members and concerns with her ex-husband.  She was seen 11/29/2023 reporting chest pain resolved by nitroglycerin  and concerning for unstable angina.  Imdur  increased to 60mg  daily. Subsequent [facility] 12/01/2023 with no change in coronary anatomy since last catheterization. Chronically occluded RCA stent with well developed L-to-R colalterals, patent LAD stent with stable moderate ISR and stable ostial diagonal stenosis. RhC with high normal wedge pressure, normal pulmonary pressure, normal CO. Of note, requested to be discharged before TR band fully deflated requiring expedited deflation and signed AMA prior to end of recovery time.   Presents today for follow up. She is upset by her experience with her cardiac catheterization. Her chest pain and exertional dyspnea have completely resolved. 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 She is excited to participate in upcoming breast cancer walk. Her BP at home 130/60. Much improved since increased dose Imdur .   Secondary hypertension workup: 10/2021 Carotid duplex  no significant stenosis. 10/2021 MR abdomen adrenal glands appear normal. No suspicious renal mass or hydronephrosis identified bilaterally. Tiny  cortical cyst in lower pole right kidney 02/2022 Cortisol, catecholamines,  metanephrines, renin-aldosterone normal. 02/2022 Renal dopplers  mild to moderate right renal artery stenosis and no stenosis on left.   06/2022 sleep study no significant OSA but mild sleep apnea during REM, no CPAP recommended.   Previous antihypertensives: Valsartan  - itching Metoprolol  - transition to Carvedilol  Spironolactone  - nausea, dehydration Doxazosin   ROS: Please see the history of present illness.    All other systems reviewed and are negative.   Studies Reviewed      Cardiac Studies & Procedures   ______________________________________________________________________________________________ CARDIAC CATHETERIZATION  CARDIAC CATHETERIZATION 12/01/2023  Conclusion   Dist LAD lesion is 50% stenosed.   Mid LAD-1 lesion is 50% stenosed.   Mid LAD-2 lesion is 30% stenosed.   Prox LAD lesion is 30% stenosed.   Prox Cx to Dist Cx lesion is 30% stenosed.   Prox RCA to Mid RCA lesion is 100% stenosed.   Ost 2nd Diag lesion is 75% stenosed.   2nd Diag lesion is 70% stenosed.   2nd Mrg lesion is 20% stenosed.   1st Mrg lesion is 60% stenosed.  1.  No significant change in coronary anatomy since last catheterization.  Chronically occluded RCA stents with well-developed left-to-right collaterals.  Patent LAD stent with stable moderate in-stent restenosis and stable ostial diagonal stenosis. 2.  Right heart catheterization showed high normal wedge pressure, normal pulmonary pressure and normal cardiac output. 3.  Normal LV systolic function.  Recommendations: Continue medical therapy.  No clear culprit is identified for the patient's symptoms and no evidence of significant heart failure by right heart catheterization.  Addendum: The patient was extremely upset that we did not find any clear explanation for her symptoms.  She requested to be discharged even before the TR band was fully deflated.  We had to expedite the deflation and the patient signed AMA before the end of her  recovery time.  I explained to the patient and her husband the risks of serious bleeding which could be life-threatening.  She fully understood and requested to leave.  Findings Coronary Findings Diagnostic  Dominance: Co-dominant  Left Main Vessel is large.  Left Anterior Descending Vessel is large. The appearance of the LAD is unchanged.  There is moderate 50% in-stent restenosis, with no significant progression from the previous cath went there was a negative flow wire analysis. Prox LAD lesion is 30% stenosed. Mid LAD-1 lesion is 50% stenosed. The lesion was previously treated over 2 years ago. Previously placed stent displays restenosis. RFR = 0.91. Mid LAD-2 lesion is 30% stenosed. Dist LAD lesion is 50% stenosed.  First Diagonal Branch Vessel is small in size.  Second Diagonal [employer] 2nd Diag lesion is 75% stenosed. The lesion is eccentric. 2nd Diag lesion is 70% stenosed.  Ramus Intermedius Vessel is small.  Left Circumflex Vessel is large. The circumflex has mild diffuse plaquing without any high-grade stenoses besides the small OM branch. Prox Cx to Dist Cx lesion is 30% stenosed.  First Obtuse Marginal Branch Vessel is small in size. Unchanged from the previous cath study.  Small vessel.  Negative RFR at the time of the previous study. 1st Mrg lesion is 60% stenosed. RFR = 0.92.  Second Obtuse Marginal Branch 2nd Mrg lesion is 20% stenosed.  Right Coronary Artery Vessel is moderate in size. The RCA is occluded proximally at the proximal stent edge, unchanged from the previous studies.  The distal RCA is collateralized from the left coronary artery. Prox RCA  to Mid RCA lesion is 100% stenosed. The lesion was previously treated .  Right Posterior Descending Artery Collaterals RPDA filled by collaterals from Dist LAD.  Intervention  No interventions have been documented.   CARDIAC CATHETERIZATION  CARDIAC CATHETERIZATION 11/03/2022  Conclusion   Dist  LAD lesion is 50% stenosed.   Mid LAD-2 lesion is 30% stenosed.   Prox LAD lesion is 30% stenosed.   Prox Cx to Dist Cx lesion is 30% stenosed.   Prox RCA to Mid RCA lesion is 100% stenosed.   Ost 2nd Diag lesion is 75% stenosed.   2nd Diag lesion is 70% stenosed.   1st Mrg lesion is 75% stenosed.   2nd Mrg lesion is 20% stenosed.   Mid LAD-1 lesion is 50% stenosed.  Stable multi-vessel CAD with no change since last cardiac cath. Patent mid LAD stent with moderate restenosis-unchanged. (Previous flow wire negative) Moderate disease in the small Diagonal branch and small obtuse marginal branch-both too small for PCI. Chronic total occlusion of the proximal RCA. The mid and distal RCA and distal branches fill from left to right collaterals. RA 4, RV 34/2/3, PA 34/13 mean 20, PCWP 6, LVEDP 18 mmHg., AO 177/80 Uncontrolled HTN  Recommendations: Continue medical management of CAD. Aggressive approach to HTN control.  Findings Coronary Findings Diagnostic  Dominance: Co-dominant  Left Main Vessel is large.  Left Anterior Descending Vessel is large. The appearance of the LAD is unchanged.  There is moderate 50% in-stent restenosis, with no significant progression from the previous cath went there was a negative flow wire analysis. Prox LAD lesion is 30% stenosed. Mid LAD-1 lesion is 50% stenosed. The lesion was previously treated over 2 years ago. Previously placed stent displays restenosis. RFR = 0.91. Mid LAD-2 lesion is 30% stenosed. Dist LAD lesion is 50% stenosed.  First Diagonal Branch Vessel is small in size.  Second Diagonal [employer] 2nd Diag lesion is 75% stenosed. The lesion is eccentric. 2nd Diag lesion is 70% stenosed.  Ramus Intermedius Vessel is small.  Left Circumflex Vessel is large. The circumflex has mild diffuse plaquing without any high-grade stenoses besides the small OM branch. Prox Cx to Dist Cx lesion is 30% stenosed.  First Obtuse Marginal  Branch Vessel is small in size. Unchanged from the previous cath study.  Small vessel.  Negative RFR at the time of the previous study. 1st Mrg lesion is 75% stenosed. RFR = 0.92.  Second Obtuse Marginal Branch 2nd Mrg lesion is 20% stenosed.  Right Coronary Artery Vessel is moderate in size. The RCA is occluded proximally at the proximal stent edge, unchanged from the previous studies.  The distal RCA is collateralized from the left coronary artery. Prox RCA to Mid RCA lesion is 100% stenosed. The lesion was previously treated .  Right Posterior Descending Artery Collaterals RPDA filled by collaterals from Dist LAD.  Intervention  No interventions have been documented.   STRESS TESTS  MYOCARDIAL PERFUSION IMAGING 03/15/2014   ECHOCARDIOGRAM  ECHOCARDIOGRAM COMPLETE 05/27/2023  Narrative ECHOCARDIOGRAM REPORT    Patient Name:   WILHELMENA ZEA Date of Exam: 05/27/2023 Medical Rec #:  [id number]       Height:       64.0 in Accession #:    [id number]      Weight:       150.0 lb Date of Birth:  1966/03/05      BSA:          1.731 m Patient Age:  57 years        BP:           168/89 mmHg Patient Gender: F               HR:           58 bpm. Exam Location:  [facility]  Procedure: 2D Echo, 3D Echo, Cardiac Doppler, Color Doppler and Strain Analysis (Both Spectral and Color Flow Doppler were utilized during procedure).  Indications:    R06.02 SOB  History:        Patient has prior history of Echocardiogram examinations, most recent 08/18/2022. CAD, Aortic Valve Disease, Signs/Symptoms:Syncope and Chest Pain; Risk Factors:Current Smoker, Dyslipidemia and Hypertension. Cancer.  Sonographer:    Ellouise Mose RDCS Referring Phys: [id number] KATLYN D WEST  IMPRESSIONS   1. Left ventricular ejection fraction, by estimation, is 55 to 60%. Left ventricular ejection fraction by 3D volume is 58 %. The left ventricle has normal function. The left ventricle has no regional wall motion  abnormalities. There is mild left ventricular hypertrophy. Left ventricular diastolic parameters are consistent with Grade I diastolic dysfunction (impaired relaxation). Elevated left ventricular end-diastolic pressure. 2. Right ventricular systolic function is normal. The right ventricular size is mildly enlarged. Tricuspid regurgitation signal is inadequate for assessing PA pressure. 3. Mitral signal corrupted by AI flow. The mitral valve is normal in structure. Trivial mitral valve regurgitation. No evidence of mitral stenosis. 4. The aortic valve is tricuspid. Aortic valve regurgitation is moderate to severe. Jet is centrally directed and eccentric towards the ventricular surface of anterior mitral valve leaflet. VC 0.8 cm. LVIDd 5 cm and LVIDs 3.1 cm. No aortic stenosis is present. 5. Aortic dilatation noted. There is borderline dilatation of the ascending aorta, measuring 39 mm.  Comparison(s): Changes from prior study are noted. AI is moderate to severe. VC 0.8 cm consistent with severe AI but PHT 678 msec consistent with mild AI.  FINDINGS Left Ventricle: Left ventricular ejection fraction, by estimation, is 55 to 60%. Left ventricular ejection fraction by 3D volume is 58 %. The left ventricle has normal function. The left ventricle has no regional wall motion abnormalities. Strain imaging was not performed. The left ventricular internal cavity size was normal in size. There is mild left ventricular hypertrophy. Left ventricular diastolic parameters are consistent with Grade I diastolic dysfunction (impaired relaxation). Elevated left ventricular end-diastolic pressure.  Right Ventricle: The right ventricular size is mildly enlarged. No increase in right ventricular wall thickness. Right ventricular systolic function is normal. Tricuspid regurgitation signal is inadequate for assessing PA pressure.  Left Atrium: Left atrial size was normal in size.  Right Atrium: Right atrial size was  normal in size.  Pericardium: There is no evidence of pericardial effusion.  Mitral Valve: Mitral signal corrupted by AI flow. The mitral valve is normal in structure. Trivial mitral valve regurgitation. No evidence of mitral valve stenosis.  Tricuspid Valve: The tricuspid valve is normal in structure. Tricuspid valve regurgitation is not demonstrated. No evidence of tricuspid stenosis.  Aortic Valve: The aortic valve is tricuspid. Aortic valve regurgitation is moderate to severe. Aortic regurgitation PHT measures 678 msec. No aortic stenosis is present. Aortic valve mean gradient measures 5.0 mmHg. Aortic valve peak gradient measures 9.5 mmHg. Aortic valve area, by VTI measures 3.76 cm.  Pulmonic Valve: The pulmonic valve was normal in structure. Pulmonic valve regurgitation is not visualized. No evidence of pulmonic stenosis.  Aorta: The aortic root is normal in size and structure and  aortic dilatation noted. There is borderline dilatation of the ascending aorta, measuring 39 mm.  IAS/Shunts: No atrial level shunt detected by color flow Doppler.  Additional Comments: 3D was performed not requiring image post processing on an independent workstation and was normal.   LEFT VENTRICLE PLAX 2D LVIDd:         5.00 cm         Diastology LVIDs:         3.30 cm         LV e' medial:    3.81 cm/s LV PW:         1.00 cm         LV E/e' medial:  19.5 LV IVS:        1.30 cm         LV e' lateral:   9.79 cm/s LVOT diam:     2.40 cm         LV E/e' lateral: 7.6 LV SV:         133 LV SV Index:   77 LVOT Area:     4.52 cm        3D Volume EF LV 3D EF:    Left ventricul LV Volumes (MOD)                            ar LV vol d, MOD    125.0 ml                   ejection A2C:                                        fraction LV vol d, MOD    119.0 ml                   by 3D A4C:                                        volume is LV vol s, MOD    53.4 ml                    58 %. A2C: LV vol s, MOD     49.7 ml A4C:                           3D Volume EF: LV SV MOD A2C:   71.6 ml       3D EF:        58 % LV SV MOD A4C:   119.0 ml      LV EDV:       241 ml LV SV MOD BP:    74.0 ml       LV ESV:       102 ml LV SV:        139 ml  RIGHT VENTRICLE            IVC RV S prime:     9.03 cm/s  IVC diam: 2.00 cm TAPSE (M-mode): 1.6 cm  LEFT ATRIUM             Index        RIGHT ATRIUM  Index LA diam:        2.40 cm 1.39 cm/m   RA Area:     16.00 cm LA Vol (A2C):   60.7 ml 35.06 ml/m  RA Volume:   47.20 ml  27.26 ml/m LA Vol (A4C):   43.2 ml 24.95 ml/m LA Biplane Vol: 55.7 ml 32.17 ml/m AORTIC VALVE AV Area (Vmax):    3.76 cm AV Area (Vmean):   3.90 cm AV Area (VTI):     3.76 cm AV Vmax:           154.00 cm/s AV Vmean:          96.900 cm/s AV VTI:            0.354 m AV Peak Grad:      9.5 mmHg AV Mean Grad:      5.0 mmHg LVOT Vmax:         128.00 cm/s LVOT Vmean:        83.600 cm/s LVOT VTI:          0.294 m LVOT/AV VTI ratio: 0.83 AI PHT:            678 msec  AORTA Ao Root diam: 3.60 cm Ao Asc diam:  3.90 cm  MITRAL VALVE MV Area (PHT): 3.91 cm    SHUNTS MV Decel Time: 194 msec    Systemic VTI:  0.29 m MV E velocity: 74.40 cm/s  Systemic Diam: 2.40 cm MV A velocity: 59.50 cm/s MV E/A ratio:  1.25  Vishnu Priya Mallipeddi Electronically signed by Diannah Late Mallipeddi Signature Date/Time: 05/27/2023/3:17:46 PM    Final    MONITORS  LONG TERM MONITOR-LIVE TELEMETRY (3-14 DAYS) 08/31/2022  Narrative 13 Day Zio Monitor  Quality: Fair.  Baseline artifact. Predominant rhythm: sinus rhythm Average heart rate: 71 bpm Max heart rate: 120 bpm Min heart rate: 44 bpm Pauses >2.5 seconds: none  7 episodes of SVT lasting up to 12 beats Rare (<1%) PACs and PVCs Rare ventricular bigeminy  Tiffany C. Raford, MD, [facility] 10/01/2022 9:11 AM       ______________________________________________________________________________________________         Risk Assessment/Calculations      STOP-Bang Score:         Physical Exam VS:  BP (!) 148/98   Pulse 82   Ht 5' 4 (1.626 m)   Wt 152 lb (68.9 kg)   SpO2 97%   BMI 26.09 kg/m        Wt Readings from Last 3 Encounters:  12/19/23 152 lb (68.9 kg)  12/01/23 149 lb (67.6 kg)  11/29/23 149 lb 12.8 oz (67.9 kg)    GEN: Well nourished, well developed in no acute distress NECK: No JVD; No carotid bruits CARDIAC: RRR, no murmurs, rubs, gallops RESPIRATORY:  Clear to auscultation without rales, wheezing or rhonchi  ABDOMEN: Soft, non-tender, non-distended EXTREMITIES:  No edema; No deformity. L radial cath site with no hematoma.   ASSESSMENT AND PLAN Preop - pending colonoscopy. May hold Aspirin  and Plavix  5-7 days prior to procedure. Per AHA/ACC guidelines, she is deemed acceptable risk for the planned procedure without additional cardiovascular testing. Will route to surgical team so they are aware.    HTN - BP elevated in clinic but controlled at home. Continue present antihypertensive regimen.  Prior secondary workup unremarkable: normal catecholamines, metanephrines, cortisol unremarkable. 10/2021 normal adrenal glands. 02/2023 no renal artery stenosis Discussed to monitor BP at home at least 2 hours after medications and sitting for 5-10 minutes.  Continue Coreg  12.5mg  BID, Imdur  60mg  daily, Nifedipine  60mg  daily. Multiple antihypertensive intolerances, as above.  Of note, she had very positive response to Imdur  increased dose. If BP uncontrolled in future, consider further increasing Imdur  dose.   Hypothyroidism - Continue to follow with PCP.   CAD /  HLD, LDL goal <55 - Prior DES RCA 2013, DES LAD [facility] 12/01/2023 with no change in coronary anatomy since last catheterization. Chronically occluded RCA stent with well developed L-to-R colalterals, patent LAD stent with stable moderate ISR and stable ostial diagonal stenosis. RHC with high normal wedge pressure, normal pulmonary  pressure, normal CO Chest pain and exertional dyspnea resolved.  GDMT Aspirin  81mg  daily, Plavix  75mg  daily, Coreg  12.5mg  BID, Imdur  60mg  daily, Repatha140mg  q2 weeks. Did not tolerate statin (Atorvastatin , Rosuvastatin). 06/02/23 LDL 62.  Heart healthy diet and regular cardiovascular exercise encouraged.    Palpitations - Prior monitor PSVT. Continue Carvedilol  12.5mg  BID. Denies palpitations.   Hx of CVA - Continue Repatha 140mg  q14 days, aspirin  81mg  daily, plavix  75 mg daily.   Moderate AI -RHC 11/2023 with no significant abnormalities. Moderat AI by echo 05/2023, repeat echo as clinically indicated.   COPD / Tobacco use - Not interested in quitting at thist ime. Continue present inhalers per PCP. Smoking cessation encouraged. Recommend utilization of [id number].         Dispo: follow up in 6 months with Dr. Mona or Reche GORMAN Finder, NP   Signed, Reche GORMAN Finder, NP

## 2023-12-19 NOTE — Telephone Encounter (Signed)
-----   Message from Toribio Fortune Mayorga sent at 12/19/2023 10:54 AM EDT ----- Thank you Reche.  Hi Marveline Profeta/[name]/[name],   Can you please schedule a colonoscopy? Dx: History of colon polyps. Room: 3.  Please ask her to hold Plavix  5 days prior.  Thanks,  Toribio Fortune, MD Gastroenterology and Hepatology [facility] ----- Message ----- From: [name], NP Sent: 12/19/2023  10:37 AM EDT To: Toribio Fortune Flavors, MD  FYI okay to hold asa/plavix  prior to colonoscopy. Recent LHC was stable with no stenting required. Her chest pain, dyspnea have resolved.

## 2023-12-20 NOTE — Telephone Encounter (Signed)
 ATC pt, no answer. LVM.

## 2023-12-26 MED ORDER — PEG 3350-KCL-NA BICARB-NACL 420 G PO SOLR: 4000.0000 mL | Freq: Once | ORAL | 0 refills | Status: AC

## 2023-12-26 NOTE — Telephone Encounter (Signed)
 Patient came into the office to schedule her colonoscopy. Rx sent to pharmacy and instructions given to patient.

## 2023-12-26 NOTE — Addendum Note (Signed)
 Addended by: DALLIE LIONEL RAMAN on: 12/26/2023 10:35 AM   Modules accepted: Orders

## 2023-12-27 NOTE — Telephone Encounter (Signed)
 Called pt to give pre-op appt details, no answer. Lvm with pre-op details.

## 2023-12-28 NOTE — Telephone Encounter (Signed)
 Called pt, no answer. Left pre-op appt details on vm.

## 2023-12-30 ENCOUNTER — Encounter (HOSPITAL_COMMUNITY): Payer: Self-pay

## 2023-12-30 ENCOUNTER — Encounter (HOSPITAL_COMMUNITY)
Admission: RE | Admit: 2023-12-30 | Discharge: 2023-12-30 | Disposition: A | Discharge status: Home / Self Care | Source: Ambulatory Visit | Attending: Gastroenterology | Admitting: Gastroenterology

## 2023-12-30 NOTE — Pre-Procedure Instructions (Signed)
 Attempted pre-op phonecall. Left VM for her to call us  back.

## 2023-12-30 NOTE — Patient Instructions (Signed)
 Bethany Wang  12/30/2023     @PREFPERIOPPHARMACY @   Your procedure is scheduled on  01/03/2024.   Report to [facility] at 0600  A.M.   Call this number if you have problems the morning of surgery:  [phone]  If you experience any cold or flu symptoms such as cough, fever, chills, shortness of breath, etc. between now and your scheduled surgery, please notify us  at the above number.   Remember:       Your last dose of plavix  should have been on 12/28/2023.    Follow the diet and prep instructions given to you by the office.   You may drink clear liquids until  0330 am on 01/03/2024.      Clear liquids allowed are:                    Water , Juice (No red color; non-citric and without pulp; diabetics please choose diet or no sugar options), Carbonated beverages (diabetics please choose diet or no sugar options), Clear Tea (No creamer, milk, or cream, including half & half and powdered creamer), Black Coffee Only (No creamer, milk or cream, including half & half and powdered creamer), and Clear Sports drink (No red color; diabetics please choose diet or no sugar options)    Take these medicines the morning of surgery with A SIP OF WATER          alprazolam (if needed), carvedilol , escitalopram , gabapentin , isosorbide , procardia , thyroid , pantoprazole , compazine  (if needed).    Do not wear jewelry, make-up or nail polish, including gel polish,  artificial nails, or any other type of covering on natural nails (fingers and  toes).  Do not wear lotions, powders, or perfumes, or deodorant.  Do not shave 48 hours prior to surgery.  Men may shave face and neck.  Do not bring valuables to the hospital.  [facility] is not responsible for any belongings or valuables.  Contacts, dentures or bridgework may not be worn into surgery.  Leave your suitcase in the car.  After surgery it may be brought to your room.  For patients admitted to the hospital, discharge time will be  determined by your treatment team.  Patients discharged the day of surgery will not be allowed to drive home and must have someone with them for 24 hours.    Special instructions:   DO NOT smoke tobacco or vape for 24 hours before your procedure.  Please read over the following fact sheets that you were given. Anesthesia Post-op Instructions and Care and Recovery After Surgery      Colonoscopy, Adult, Care After The following information offers guidance on how to care for yourself after your procedure. Your health care provider may also give you more specific instructions. If you have problems or questions, contact your health care provider. What can I expect after the procedure? After the procedure, it is common to have: A small amount of blood in your stool for 24 hours after the procedure. Some gas. Mild cramping or bloating of your abdomen. Follow these instructions at home: Eating and drinking  Drink enough fluid to keep your urine pale yellow. Follow instructions from your health care provider about eating or drinking restrictions. Resume your normal diet as told by your health care provider. Avoid heavy or fried foods that are hard to digest. Activity Rest as told by your health care provider. Avoid sitting for a long time without moving. Get up to take  short walks every 1-2 hours. This is important to improve blood flow and breathing. Ask for help if you feel weak or unsteady. Return to your normal activities as told by your health care provider. Ask your health care provider what activities are safe for you. Managing cramping and bloating  Try walking around when you have cramps or feel bloated. If directed, apply heat to your abdomen as told by your health care provider. Use the heat source that your health care provider recommends, such as a moist heat pack or a heating pad. Place a towel between your skin and the heat source. Leave the heat on for 20-30 minutes. Remove  the heat if your skin turns bright red. This is especially important if you are unable to feel pain, heat, or cold. You have a greater risk of getting burned. General instructions If you were given a sedative during the procedure, it can affect you for several hours. Do not drive or operate machinery until your health care provider says that it is safe. For the first 24 hours after the procedure: Do not sign important documents. Do not drink alcohol. Do your regular daily activities at a slower pace than normal. Eat soft foods that are easy to digest. Take over-the-counter and prescription medicines only as told by your health care provider. Keep all follow-up visits. This is important. Contact a health care provider if: You have blood in your stool 2-3 days after the procedure. Get help right away if: You have more than a small spotting of blood in your stool. You have large blood clots in your stool. You have swelling of your abdomen. You have nausea or vomiting. You have a fever. You have increasing pain in your abdomen that is not relieved with medicine. These symptoms may be an emergency. Get help right away. Call 911. Do not wait to see if the symptoms will go away. Do not drive yourself to the hospital. Summary After the procedure, it is common to have a small amount of blood in your stool. You may also have mild cramping and bloating of your abdomen. If you were given a sedative during the procedure, it can affect you for several hours. Do not drive or operate machinery until your health care provider says that it is safe. Get help right away if you have a lot of blood in your stool, nausea or vomiting, a fever, or increased pain in your abdomen. This information is not intended to replace advice given to you by your health care provider. Make sure you discuss any questions you have with your health care provider. Document Revised: 05/11/2022 Document Reviewed: 11/19/2020 Elsevier  Patient Education  2024 [employer].General Anesthesia, Adult, Care After The following information offers guidance on how to care for yourself after your procedure. Your health care provider may also give you more specific instructions. If you have problems or questions, contact your health care provider. What can I expect after the procedure? After the procedure, it is common for people to: Have pain or discomfort at the IV site. Have nausea or vomiting. Have a sore throat or hoarseness. Have trouble concentrating. Feel cold or chills. Feel weak, sleepy, or tired (fatigue). Have soreness and body aches. These can affect parts of the body that were not involved in surgery. Follow these instructions at home: For the time period you were told by your health care provider:  Rest. Do not participate in activities where you could fall or become injured. Do not  drive or use machinery. Do not drink alcohol. Do not take sleeping pills or medicines that cause drowsiness. Do not make important decisions or sign legal documents. Do not take care of children on your own. General instructions Drink enough fluid to keep your urine pale yellow. If you have sleep apnea, surgery and certain medicines can increase your risk for breathing problems. Follow instructions from your health care provider about wearing your sleep device: Anytime you are sleeping, including during daytime naps. While taking prescription pain medicines, sleeping medicines, or medicines that make you drowsy. Return to your normal activities as told by your health care provider. Ask your health care provider what activities are safe for you. Take over-the-counter and prescription medicines only as told by your health care provider. Do not use any products that contain nicotine or tobacco. These products include cigarettes, chewing tobacco, and vaping devices, such as e-cigarettes. These can delay incision healing after surgery. If  you need help quitting, ask your health care provider. Contact a health care provider if: You have nausea or vomiting that does not get better with medicine. You vomit every time you eat or drink. You have pain that does not get better with medicine. You cannot urinate or have bloody urine. You develop a skin rash. You have a fever. Get help right away if: You have trouble breathing. You have chest pain. You vomit blood. These symptoms may be an emergency. Get help right away. Call 911. Do not wait to see if the symptoms will go away. Do not drive yourself to the hospital. Summary After the procedure, it is common to have a sore throat, hoarseness, nausea, vomiting, or to feel weak, sleepy, or fatigue. For the time period you were told by your health care provider, do not drive or use machinery. Get help right away if you have difficulty breathing, have chest pain, or vomit blood. These symptoms may be an emergency. This information is not intended to replace advice given to you by your health care provider. Make sure you discuss any questions you have with your health care provider. Document Revised: 06/26/2021 Document Reviewed: 06/26/2021 Elsevier Patient Education  2024 [employer].

## 2024-01-03 ENCOUNTER — Ambulatory Visit (HOSPITAL_COMMUNITY): Admitting: Anesthesiology

## 2024-01-03 ENCOUNTER — Ambulatory Visit (HOSPITAL_COMMUNITY)
Admission: RE | Admit: 2024-01-03 | Discharge: 2024-01-03 | Disposition: A | Discharge status: Home / Self Care | Attending: Gastroenterology | Admitting: Gastroenterology

## 2024-01-03 ENCOUNTER — Ambulatory Visit (HOSPITAL_BASED_OUTPATIENT_CLINIC_OR_DEPARTMENT_OTHER): Admitting: Anesthesiology

## 2024-01-03 ENCOUNTER — Encounter (HOSPITAL_COMMUNITY): Payer: Self-pay | Admitting: Gastroenterology

## 2024-01-03 ENCOUNTER — Encounter (HOSPITAL_COMMUNITY)
Admission: RE | Disposition: A | Discharge status: Home / Self Care | Payer: Self-pay | Source: Home / Self Care | Attending: Gastroenterology

## 2024-01-03 DIAGNOSIS — D124 Benign neoplasm of descending colon: Secondary | ICD-10-CM | POA: Insufficient documentation

## 2024-01-03 DIAGNOSIS — K648 Other hemorrhoids: Secondary | ICD-10-CM | POA: Diagnosis not present

## 2024-01-03 DIAGNOSIS — Z8711 Personal history of peptic ulcer disease: Secondary | ICD-10-CM | POA: Diagnosis not present

## 2024-01-03 DIAGNOSIS — I252 Old myocardial infarction: Secondary | ICD-10-CM | POA: Diagnosis not present

## 2024-01-03 DIAGNOSIS — Z860100 Personal history of colon polyps, unspecified: Secondary | ICD-10-CM | POA: Diagnosis not present

## 2024-01-03 DIAGNOSIS — I2510 Atherosclerotic heart disease of native coronary artery without angina pectoris: Secondary | ICD-10-CM | POA: Diagnosis not present

## 2024-01-03 DIAGNOSIS — K635 Polyp of colon: Secondary | ICD-10-CM | POA: Diagnosis not present

## 2024-01-03 DIAGNOSIS — K219 Gastro-esophageal reflux disease without esophagitis: Secondary | ICD-10-CM | POA: Diagnosis not present

## 2024-01-03 DIAGNOSIS — F418 Other specified anxiety disorders: Secondary | ICD-10-CM | POA: Diagnosis not present

## 2024-01-03 DIAGNOSIS — D123 Benign neoplasm of transverse colon: Secondary | ICD-10-CM | POA: Diagnosis not present

## 2024-01-03 DIAGNOSIS — I25119 Atherosclerotic heart disease of native coronary artery with unspecified angina pectoris: Secondary | ICD-10-CM | POA: Insufficient documentation

## 2024-01-03 DIAGNOSIS — I10 Essential (primary) hypertension: Secondary | ICD-10-CM | POA: Insufficient documentation

## 2024-01-03 DIAGNOSIS — E119 Type 2 diabetes mellitus without complications: Secondary | ICD-10-CM | POA: Diagnosis not present

## 2024-01-03 DIAGNOSIS — Z860101 Personal history of adenomatous and serrated colon polyps: Secondary | ICD-10-CM

## 2024-01-03 DIAGNOSIS — Z87891 Personal history of nicotine dependence: Secondary | ICD-10-CM | POA: Insufficient documentation

## 2024-01-03 DIAGNOSIS — G709 Myoneural disorder, unspecified: Secondary | ICD-10-CM | POA: Diagnosis not present

## 2024-01-03 DIAGNOSIS — Z1211 Encounter for screening for malignant neoplasm of colon: Secondary | ICD-10-CM | POA: Insufficient documentation

## 2024-01-03 DIAGNOSIS — Z8673 Personal history of transient ischemic attack (TIA), and cerebral infarction without residual deficits: Secondary | ICD-10-CM | POA: Insufficient documentation

## 2024-01-03 HISTORY — PX: COLONOSCOPY: SHX5424

## 2024-01-03 LAB — GLUCOSE, CAPILLARY: Glucose-Capillary: 167 mg/dL — ABNORMAL HIGH (ref 70–99)

## 2024-01-03 LAB — HM COLONOSCOPY

## 2024-01-03 SURGERY — COLONOSCOPY
Anesthesia: General

## 2024-01-03 MED ORDER — EPHEDRINE SULFATE-NACL 50-0.9 MG/10ML-% IV SOSY
PREFILLED_SYRINGE | INTRAVENOUS | Status: DC | PRN
  Administered 2024-01-03: 10 mg via INTRAVENOUS
  Administered 2024-01-03: 7.5 mg via INTRAVENOUS

## 2024-01-03 MED ORDER — LIDOCAINE 2% (20 MG/ML) 5 ML SYRINGE
INTRAMUSCULAR | Status: DC | PRN
  Administered 2024-01-03: 50 mg via INTRAVENOUS

## 2024-01-03 MED ORDER — LACTATED RINGERS IV SOLN: INTRAVENOUS | Status: DC

## 2024-01-03 MED ORDER — PHENYLEPHRINE 80 MCG/ML (10ML) SYRINGE FOR IV PUSH (FOR BLOOD PRESSURE SUPPORT)
PREFILLED_SYRINGE | INTRAVENOUS | Status: DC | PRN
  Administered 2024-01-03: 80 ug via INTRAVENOUS

## 2024-01-03 MED ORDER — PROPOFOL 10 MG/ML IV BOLUS
INTRAVENOUS | Status: DC | PRN
  Administered 2024-01-03: 100 mg via INTRAVENOUS

## 2024-01-03 MED ORDER — PROPOFOL 500 MG/50ML IV EMUL
INTRAVENOUS | Status: DC | PRN
  Administered 2024-01-03: 150 ug/kg/min via INTRAVENOUS

## 2024-01-03 NOTE — H&P (Signed)
 Bethany Wang is an 58 y.o. female.   Chief Complaint: history of colon polyps HPI: 58 y/o F with PMH breast cancer status post chemoradiation, coronary artery disease status post stent placement, brain tumor, depression, diabetes, hypertension, hyperlipidemia, stroke, seizures, MI with cardiac stents, coming for history of colon polyps.  The patient denies having any nausea, vomiting, fever, chills, hematochezia, melena, hematemesis, abdominal distention, abdominal pain, diarrhea, jaundice, pruritus or weight loss.  No family history of colon cancer.  Past Medical History:  Diagnosis Date   Anemia    Anxiety    Bilateral breast cysts 12/30/2015   Brain tumor (HCC)    Breast cancer (HCC)    Left Breast Cancer   Breast disorder    BV (bacterial vaginosis) 09/09/2015   Coronary atherosclerosis of native coronary artery    a. s/p DES to RCA in 2013 b. DES to LAD in 2014 c. cath in 04/2016 showing patent LAD stent with D2 jailed by LAD stent and CTO of RCA with left to right collaterals present   Cyst of pharynx or nasopharynx    [name]   Decreased libido 12/25/2018   Depression    Diabetes mellitus without complication (HCC)    Essential hypertension    Falls    x 3-4 in past year   GERD (gastroesophageal reflux disease)    Headache    Heart attack (HCC)    x4   History of hematuria    Mixed hyperlipidemia    Personal history of chemotherapy    Left Breast Cancer   Personal history of radiation therapy    Left Breast Cancer   PUD (peptic ulcer disease)    Seizures (HCC)    Shingles 09/09/2015   Stroke (HCC)    12-2017 on Plavix , only deficit is headaches   Suicide attempt (HCC)    3 attempts in remote past   Trichimoniasis 08/20/2019   Treated 08/20/19    Uterine cancer (HCC)    Vitamin D  deficiency disease 04/03/2019    Past Surgical History:  Procedure Laterality Date   ABDOMINAL HYSTERECTOMY     BIOPSY  11/28/2019   Procedure: BIOPSY;   Surgeon: Golda Claudis PENNER, MD;  Location: [facility];  Service: Endoscopy;;  antral, gastric body    BIOPSY  08/18/2022   Procedure: BIOPSY;  Surgeon: Eartha Angelia Sieving, MD;  Location: [facility];  Service: Gastroenterology;;   BLADDER SURGERY     BREAST LUMPECTOMY Left    BREAST LUMPECTOMY Right 02/24/2018   Procedure: RIGHT BREAST LUMPECTOMY ERAS PATHWAY;  Surgeon: Curvin Deward MOULD, MD;  Location: [facility] OR;  Service: General;  Laterality: Right;   CARDIAC CATHETERIZATION N/A 05/10/2016   Procedure: Left Heart Cath and Coronary Angiography;  Surgeon: Victory LELON Sharps, MD;  Location: [facility] INVASIVE CV LAB;  Service: Cardiovascular;  Laterality: N/A;   CAUTERIZE INNER NOSE  07/13/2020   COLONOSCOPY  May 2010   Fleishman: normal rectum, internal hemorrhoids, , benign colonic polyp   COLONOSCOPY N/A 01/05/2017   Procedure: COLONOSCOPY;  Surgeon: Golda Claudis PENNER, MD;  Location: [facility];  Service: Endoscopy;  Laterality: N/A;  1:00   CORONARY PRESSURE/FFR STUDY N/A 10/23/2020   Procedure: INTRAVASCULAR PRESSURE WIRE/FFR STUDY;  Surgeon: Sharps Victory LELON, MD;  Location: [facility] INVASIVE CV LAB;  Service: Cardiovascular;  Laterality: N/A;   CORONARY PRESSURE/FFR STUDY N/A 03/30/2021   Procedure: INTRAVASCULAR PRESSURE WIRE/FFR STUDY;  Surgeon: Mady Bruckner, MD;  Location: [facility] INVASIVE CV LAB;  Service:  Cardiovascular;  Laterality: N/A;   ESOPHAGEAL DILATION  11/28/2019   Procedure: ESOPHAGEAL DILATION;  Surgeon: Golda Claudis PENNER, MD;  [facility]: [facility];  Service: Endoscopy;;   ESOPHAGEAL DILATION N/A 07/15/2023   Procedure: DILATION, ESOPHAGUS;  Surgeon: Eartha Angelia Sieving, MD;  Location: [facility];  Service: Gastroenterology;  Laterality: N/A;  8:15AM;ASA 3   ESOPHAGOGASTRODUODENOSCOPY     2001 Dr. Dyane: distal esophagitis, small hiatal hernia,. Dr. Claudene 2006? no records available currently, pt also reports  EGD a few years ago with Dr. Shaaron, do not have these reports anywhere in  medical records   ESOPHAGOGASTRODUODENOSCOPY  06/02/2011   MFM:Mzrzwu pill impaction as described above s/p dilation of a probable cervical esophageal web/bx abnormal esophageal and gastric mucosa. + H.pylori gastritis    ESOPHAGOGASTRODUODENOSCOPY N/A 07/15/2023   Procedure: EGD (ESOPHAGOGASTRODUODENOSCOPY);  Surgeon: Eartha Angelia, Sieving, MD;  Location: [facility];  Service: Gastroenterology;  Laterality: N/A;  8:15AM;ASA 3   ESOPHAGOGASTRODUODENOSCOPY (EGD) WITH PROPOFOL  N/A 11/28/2019   Procedure: ESOPHAGOGASTRODUODENOSCOPY (EGD) WITH PROPOFOL ;  Surgeon: Golda Claudis PENNER, MD;  [facility]: [facility];  Service: Endoscopy;  Laterality: N/A;  1020   ESOPHAGOGASTRODUODENOSCOPY (EGD) WITH PROPOFOL  N/A 08/18/2022   Procedure: ESOPHAGOGASTRODUODENOSCOPY (EGD) WITH PROPOFOL ;  Surgeon: Eartha Angelia Sieving, MD;  [facility]: [facility];  Service: Gastroenterology;  Laterality: N/A;  8:30AM;ASA 1   EYE SURGERY     Removed glass   HAND SURGERY Right    Left breast lumpectomy     Benign   LEFT HEART CATH AND CORONARY ANGIOGRAPHY N/A 10/25/2018   Procedure: LEFT HEART CATH AND CORONARY ANGIOGRAPHY;  Surgeon: Darron Deatrice LABOR, MD;  Location: [facility]VAS[facility] CV LAB;  Service: Cardiovascular;  Laterality: N/A;   LEFT HEART CATH AND CORONARY ANGIOGRAPHY N/A 10/23/2020   Procedure: LEFT HEART CATH AND CORONARY ANGIOGRAPHY;  Surgeon: Claudene Victory ORN, MD;  Location: [facility] CV [facility];  Service: Cardiovascular;  Laterality: N/A;   LEFT HEART CATH AND CORONARY ANGIOGRAPHY N/A 03/30/2021   Procedure: LEFT HEART CATH AND CORONARY ANGIOGRAPHY;  Surgeon: Mady Bruckner, MD;  Location: [facility];  Service: Cardiovascular;  Laterality: N/A;   LEFT HEART CATH AND CORONARY ANGIOGRAPHY N/A 01/22/2022   Procedure: LEFT HEART CATH AND CORONARY ANGIOGRAPHY;  Surgeon: Wonda Sharper, MD;  Location: [facility] INVASIVE CV LAB;  Service: Cardiovascular;  Laterality: N/A;   LEFT HEART CATHETERIZATION WITH CORONARY  ANGIOGRAM N/A 07/03/2014   Procedure: LEFT HEART CATHETERIZATION WITH CORONARY ANGIOGRAM;  Surgeon: Victory ORN Claudene, MD;  Location: [facility] CATH LAB;  Service: Cardiovascular;  Laterality: N/A;   POLYPECTOMY  01/05/2017   Procedure: POLYPECTOMY;  Surgeon: Golda Claudis PENNER, MD;  [facility]: [facility];  Service: Endoscopy;;  colon   RIGHT/LEFT HEART CATH AND CORONARY ANGIOGRAPHY N/A 11/03/2022   Procedure: RIGHT/LEFT HEART CATH AND CORONARY ANGIOGRAPHY;  Surgeon: Verlin Bruckner BIRCH, MD;  Location: [facility] CV LAB;  Service: Cardiovascular;  Laterality: N/A;   RIGHT/LEFT HEART CATH AND CORONARY ANGIOGRAPHY N/A 12/01/2023   Procedure: RIGHT/LEFT HEART CATH AND CORONARY ANGIOGRAPHY;  Surgeon: Darron Deatrice LABOR, MD;  Location: [facility] CV LAB;  Service: Cardiovascular;  Laterality: N/A;   SHOULDER SURGERY Left     Family History  Problem Relation Age of Onset   Colon cancer Paternal Grandfather    Cancer Father    Cancer Maternal Uncle    Alzheimer's disease Paternal Aunt    Cirrhosis Maternal Uncle    Cancer Paternal Aunt  lung   Aneurysm Mother        brain   Heart disease Brother    Heart attack Brother    Mental illness Daughter    ADD / ADHD Daughter    Bipolar disorder Daughter    Mental illness Son    ADD / ADHD Son    Bipolar disorder Son    Mental illness Son    ADD / ADHD Son    Bipolar disorder Son    Social History:  reports that she quit smoking about 11 years ago. Her smoking use included cigarettes and cigars. She started smoking about 40 years ago. She has a 6.4 pack-year smoking history. She has never used smokeless tobacco. She reports that she does not drink alcohol and does not use drugs.  Allergies:  Allergies  Allergen Reactions   Bee Venom Anaphylaxis and Other (See Comments)    throat swelled and had to the hospital - Yellow jacket sting   Lipitor [Atorvastatin ] Rash and Other (See Comments)    Liver damage & Upper Abdominal pain, Break out like a  rash . Per last hosp visit. It exposed on inpatient stay.   Penicillins Anaphylaxis   Shellfish Allergy Swelling   Tomato Itching and Swelling   Spironolactone  Other (See Comments)    Nausea, dehydration   Valsartan  Itching   Zofran  [Ondansetron ] Itching   Crestor [Rosuvastatin] Rash and Other (See Comments)    Upper Abdominal pain   Ranolazine  Rash    Medications Prior to Admission  Medication Sig Dispense Refill   albuterol  (VENTOLIN  HFA) 108 (90 Base) MCG/ACT inhaler Inhale 2 puffs into the lungs every 4 (four) hours as needed for wheezing or shortness of breath. 1 each 3   ALPRAZolam  (XANAX ) 0.5 MG tablet Take 1 tablet (0.5 mg total) by mouth 2 (two) times daily as needed for anxiety. 30 tablet 0   aspirin  EC 81 MG tablet Take 1 tablet (81 mg total) by mouth daily. Swallow whole. 90 tablet 3   Cholecalciferol (DIALYVITE VITAMIN D  5000) 125 MCG (5000 UT) capsule Take 5,000 Units by mouth in the morning.     escitalopram  (LEXAPRO ) 20 MG tablet Take 1 tablet (20 mg total) by mouth daily. 30 tablet 3   gabapentin  (NEURONTIN ) 100 MG capsule Take 1 capsule (100 mg total) by mouth 3 (three) times daily. 30 capsule 0   isosorbide  mononitrate (IMDUR ) 60 MG 24 hr tablet Take 1 tablet (60 mg total) by mouth daily. 90 tablet 1   linaclotide  (LINZESS ) 145 MCG CAPS capsule Take 145 mcg by mouth daily before breakfast.     NIFEdipine  (PROCARDIA  XL/NIFEDICAL XL) 60 MG 24 hr tablet Take 1 tablet (60 mg total) by mouth daily. 30 tablet 3   nitroGLYCERIN  (NITROSTAT ) 0.4 MG SL tablet DISSOLVE 1 TABLET UNDER THE TONGUE AS NEEDED FOR CHEST PAIN EVERY 5 MINUTES UP TO 3 TIMES. IF NO RELIEF CALL 911. 25 tablet 1   NP THYROID  60 MG tablet Take 60 mg by mouth every morning.     pantoprazole  (PROTONIX ) 40 MG tablet TAKE 1 TABLET BY MOUTH ONCE DAILY 60 tablet 1   potassium chloride  SA (KLOR-CON  M) 20 MEQ tablet Take 20 mEq by mouth 2 (two) times daily.     traZODone (DESYREL) 50 MG tablet Take 50 mg by mouth at  bedtime.     carvedilol  (COREG ) 12.5 MG tablet Take 1 tablet (12.5 mg total) by mouth 2 (two) times daily. 180 tablet 3   clopidogrel  (  PLAVIX ) 75 MG tablet Take 1 tablet (75 mg total) by mouth daily. 90 tablet 3   Evolocumab (REPATHA SURECLICK [place]) Inject 140 mg into the skin every 14 (fourteen) days.     fluticasone -salmeterol (ADVAIR) 100-50 MCG/ACT AEPB INHALE 1 PUFF BY MOUTH TWICE DAILY 60 each 10   Glycerin-Hypromellose-PEG 400 (DRY EYE RELIEF DROPS OP) Place 1 drop into both eyes daily as needed (dry eyes).     prochlorperazine  (COMPAZINE ) 10 MG tablet Take 1 tablet (10 mg total) by mouth every 6 (six) hours as needed for nausea or vomiting. 90 tablet 1    Results for orders placed or performed during the hospital encounter of 01/03/24 (from the past 48 hours)  Glucose, capillary     Status: Abnormal   Collection Time: 01/03/24  6:39 AM  Result Value Ref Range   Glucose-Capillary 167 (H) 70 - 99 mg/dL    Comment: Glucose reference range applies only to samples taken after fasting for at least 8 hours.   No results found.  Review of Systems  All other systems reviewed and are negative.   Blood pressure (!) 100/54, pulse 62, temperature 97.7 F (36.5 C), temperature source Oral, resp. rate 18. Physical Exam  GENERAL: The patient is AO x3, in no acute distress. HEENT: Head is normocephalic and atraumatic. EOMI are intact. Mouth is well hydrated and without lesions. NECK: Supple. No masses LUNGS: Clear to auscultation. No presence of rhonchi/wheezing/rales. Adequate chest expansion HEART: RRR, normal s1 and s2. ABDOMEN: Soft, nontender, no guarding, no peritoneal signs, and nondistended. BS +. No masses. EXTREMITIES: Without any cyanosis, clubbing, rash, lesions or edema. NEUROLOGIC: AOx3, no focal motor deficit. SKIN: no jaundice, no rashes  Assessment/Plan 58 y/o F with PMH breast cancer status post chemoradiation, coronary artery disease status post stent placement, brain  tumor, depression, diabetes, hypertension, hyperlipidemia, stroke, seizures, MI with cardiac stents, coming for history of colon polyps.  Will proceed with colonoscopy.  Toribio Eartha Flavors, MD 01/03/2024, 7:32 AM

## 2024-01-03 NOTE — Anesthesia Preprocedure Evaluation (Signed)
Anesthesia Evaluation  Patient identified by MRN, date of birth, ID band Patient awake    Reviewed: Allergy & Precautions, H&P , NPO status , Patient's Chart, lab work & pertinent test results, reviewed documented beta blocker date and time   Airway Mallampati: II  TM Distance: >3 FB Neck ROM: full    Dental no notable dental hx.    Pulmonary neg pulmonary ROS, former smoker   Pulmonary exam normal breath sounds clear to auscultation       Cardiovascular Exercise Tolerance: Good hypertension, + angina  + CAD and + Past MI  negative cardio ROS  Rhythm:regular Rate:Normal     Neuro/Psych  Headaches, Seizures -,  PSYCHIATRIC DISORDERS Anxiety Depression     Neuromuscular disease CVA negative neurological ROS  negative psych ROS   GI/Hepatic negative GI ROS, Neg liver ROS, PUD,GERD  ,,  Endo/Other  negative endocrine ROSdiabetes    Renal/GU negative Renal ROS  negative genitourinary   Musculoskeletal   Abdominal   Peds  Hematology negative hematology ROS (+) Blood dyscrasia, anemia   Anesthesia Other Findings   Reproductive/Obstetrics negative OB ROS                             Anesthesia Physical Anesthesia Plan  ASA: 3  Anesthesia Plan: General   Post-op Pain Management:    Induction:   PONV Risk Score and Plan: Propofol infusion  Airway Management Planned:   Additional Equipment:   Intra-op Plan:   Post-operative Plan:   Informed Consent: I have reviewed the patients History and Physical, chart, labs and discussed the procedure including the risks, benefits and alternatives for the proposed anesthesia with the patient or authorized representative who has indicated his/her understanding and acceptance.     Dental Advisory Given  Plan Discussed with: CRNA  Anesthesia Plan Comments:        Anesthesia Quick Evaluation

## 2024-01-03 NOTE — Transfer of Care (Signed)
 Immediate Anesthesia Transfer of Care Note  Patient: Bethany Wang  Procedure(s) Performed: COLONOSCOPY  Patient Location: Short Stay  Anesthesia Type:General  Level of Consciousness: awake  Airway & Oxygen  Therapy: Patient Spontanous Breathing  Post-op Assessment: Report given to RN  Post vital signs: Reviewed  Last Vitals:  Vitals Value Taken Time  BP    Temp 36.4 C 01/03/24 08:18  Pulse 81 01/03/24 08:18  Resp 18 01/03/24 08:18  SpO2 100 % 01/03/24 08:18    Last Pain:  Vitals:   01/03/24 0818  TempSrc: Oral  PainSc: 0-No pain      Patients Stated Pain Goal: 8 (01/03/24 0818)  Complications: No notable events documented.

## 2024-01-03 NOTE — Op Note (Signed)
 [facility] Patient Name: Bethany Wang Procedure Date: 01/03/2024 7:10 AM MRN: [id number] Date of Birth: 09/22/1965 Attending MD: Toribio Fortune , , [id number] CSN: [id number] Age: 58 Admit Type: Outpatient Procedure:                Colonoscopy Indications:              Surveillance: Personal history of adenomatous                            polyps on last colonoscopy > 5 years ago Providers:                Toribio Fortune, Jon A. Gerome RN, RN, Jon Loge Referring MD:              Medicines:                Monitored Anesthesia Care Complications:            No immediate complications. Estimated Blood Loss:     Estimated blood loss: none. Procedure:                Pre-Anesthesia Assessment:                           - Prior to the procedure, a History and Physical                            was performed, and patient medications, allergies                            and sensitivities were reviewed. The patient's                            tolerance of previous anesthesia was reviewed.                           - The risks and benefits of the procedure and the                            sedation options and risks were discussed with the                            patient. All questions were answered and informed                            consent was obtained.                           - ASA Grade Assessment: III - A patient with severe                            systemic disease.                           After obtaining informed consent, the colonoscope  was passed under direct vision. Throughout the                            procedure, the patient's blood pressure, pulse, and                            oxygen  saturations were monitored continuously. The                            [id number]-[id number] ([id number]) Peds Colon was introduced                            through the anus and advanced to the the cecum,                             identified by appendiceal orifice and ileocecal                            valve. The colonoscopy was performed without                            difficulty. The patient tolerated the procedure                            well. The quality of the bowel preparation was good. Scope In: 7:46:49 AM Scope Out: 8:09:56 AM Scope Withdrawal Time: 0 hours 15 minutes 7 seconds  Total Procedure Duration: 0 hours 23 minutes 7 seconds  Findings:      The perianal and digital rectal examinations were normal.      Six semi-sessile polyps were found in the transverse colon. The polyps       were 3 to 8 mm in size. These polyps were removed with a cold snare.       Resection and retrieval were complete.      Three sessile polyps were found in the descending colon. The polyps were       3 to 8 mm in size. These polyps were removed with a cold snare.       Resection and retrieval were complete.      Non-bleeding internal hemorrhoids were found during retroflexion. The       hemorrhoids were small. Impression:               - Six 3 to 8 mm polyps in the transverse colon,                            removed with a cold snare. Resected and retrieved.                           - Three 3 to 8 mm polyps in the descending colon,                            removed with a cold snare. Resected and retrieved.                           - Non-bleeding  internal hemorrhoids. Moderate Sedation:      Per Anesthesia Care Recommendation:           - Discharge patient to home (ambulatory).                           - Resume previous diet.                           - Await pathology results.                           - Repeat colonoscopy in 3 years for surveillance. Procedure Code(s):        --- Professional ---                           [phone], Colonoscopy, flexible; with removal of                            tumor(s), polyp(s), or other lesion(s) by snare                            technique Diagnosis  Code(s):        --- Professional ---                           Z86.010, Personal history of colonic polyps                           D12.3, Benign neoplasm of transverse colon (hepatic                            flexure or splenic flexure)                           D12.4, Benign neoplasm of descending colon                           K64.8, Other hemorrhoids CPT copyright 2022 [employer]. All rights reserved. The codes documented in this report are preliminary and upon coder review may  be revised to meet current compliance requirements. Toribio Fortune, MD Toribio Fortune,  01/03/2024 8:13:25 AM This report has been signed electronically. Number of Addenda: 0

## 2024-01-03 NOTE — Anesthesia Postprocedure Evaluation (Signed)
 Anesthesia Post Note  Patient: Bethany Wang  Procedure(s) Performed: COLONOSCOPY  Patient location during evaluation: Short Stay Anesthesia Type: General Level of consciousness: awake and alert Pain management: pain level controlled Vital Signs Assessment: post-procedure vital signs reviewed and stable Respiratory status: spontaneous breathing Cardiovascular status: blood pressure returned to baseline and stable Postop Assessment: no apparent nausea or vomiting Anesthetic complications: no   No notable events documented.   Last Vitals:  Vitals:   01/03/24 0822 01/03/24 0827  BP: (!) 87/55 119/71  Pulse:    Resp:    Temp:    SpO2:      Last Pain:  Vitals:   01/03/24 0818  TempSrc: Oral  PainSc: 0-No pain                 Karter Haire

## 2024-01-03 NOTE — Discharge Instructions (Signed)
 You are being discharged to home.  Resume your previous diet.  We are waiting for your pathology results.  Your physician has recommended a repeat colonoscopy in three years for surveillance.

## 2024-01-04 ENCOUNTER — Encounter (INDEPENDENT_AMBULATORY_CARE_PROVIDER_SITE_OTHER): Payer: Self-pay | Admitting: *Deleted

## 2024-01-04 ENCOUNTER — Ambulatory Visit (INDEPENDENT_AMBULATORY_CARE_PROVIDER_SITE_OTHER): Payer: Self-pay | Admitting: Gastroenterology

## 2024-01-04 LAB — SURGICAL PATHOLOGY

## 2024-01-05 ENCOUNTER — Encounter (HOSPITAL_COMMUNITY): Payer: Self-pay | Admitting: Gastroenterology

## 2024-01-10 NOTE — Progress Notes (Signed)
 3 yr TCS noted in recall Patient's PCP is on EPIC procedure note and pathology result faxed to PCP

## 2024-01-11 DIAGNOSIS — R350 Frequency of micturition: Secondary | ICD-10-CM | POA: Diagnosis not present

## 2024-01-19 DIAGNOSIS — E039 Hypothyroidism, unspecified: Secondary | ICD-10-CM | POA: Diagnosis not present

## 2024-01-19 DIAGNOSIS — E876 Hypokalemia: Secondary | ICD-10-CM | POA: Diagnosis not present

## 2024-01-20 ENCOUNTER — Ambulatory Visit

## 2024-01-25 ENCOUNTER — Encounter (INDEPENDENT_AMBULATORY_CARE_PROVIDER_SITE_OTHER): Payer: Self-pay | Admitting: Gastroenterology

## 2024-01-31 ENCOUNTER — Ambulatory Visit
Admission: RE | Admit: 2024-01-31 | Discharge: 2024-01-31 | Disposition: A | Discharge status: Home / Self Care | Source: Ambulatory Visit | Attending: Internal Medicine | Admitting: Internal Medicine

## 2024-01-31 DIAGNOSIS — Z1231 Encounter for screening mammogram for malignant neoplasm of breast: Secondary | ICD-10-CM | POA: Diagnosis not present

## 2024-03-12 ENCOUNTER — Other Ambulatory Visit (HOSPITAL_COMMUNITY)
Admission: RE | Admit: 2024-03-12 | Discharge: 2024-03-12 | Disposition: A | Discharge status: Home / Self Care | Source: Ambulatory Visit | Attending: Nephrology | Admitting: Nephrology

## 2024-03-12 DIAGNOSIS — N189 Chronic kidney disease, unspecified: Secondary | ICD-10-CM | POA: Diagnosis present

## 2024-03-12 LAB — CBC
HCT: 43.7 % (ref 36.0–46.0)
Hemoglobin: 14.2 g/dL (ref 12.0–15.0)
MCH: 27.6 pg (ref 26.0–34.0)
MCHC: 32.5 g/dL (ref 30.0–36.0)
MCV: 84.9 fL (ref 80.0–100.0)
Platelets: 247 K/uL (ref 150–400)
RBC: 5.15 MIL/uL — ABNORMAL HIGH (ref 3.87–5.11)
RDW: 13.7 % (ref 11.5–15.5)
WBC: 5.2 K/uL (ref 4.0–10.5)
nRBC: 0 % (ref 0.0–0.2)

## 2024-03-12 LAB — RENAL FUNCTION PANEL
Albumin: 4.3 g/dL (ref 3.5–5.0)
Anion gap: 10 (ref 5–15)
BUN: 9 mg/dL (ref 6–20)
CO2: 25 mmol/L (ref 22–32)
Calcium: 9.8 mg/dL (ref 8.9–10.3)
Chloride: 107 mmol/L (ref 98–111)
Creatinine, Ser: 0.76 mg/dL (ref 0.44–1.00)
GFR, Estimated: >60 mL/min (ref >60)
Glucose, Bld: 99 mg/dL (ref 70–99)
Phosphorus: 3.6 mg/dL (ref 2.5–4.6)
Potassium: 3.9 mmol/L (ref 3.5–5.1)
Sodium: 142 mmol/L (ref 135–145)

## 2024-03-12 LAB — SODIUM, URINE, RANDOM: Sodium, Ur: 49 mmol/L

## 2024-03-13 LAB — MICROALBUMIN / CREATININE URINE RATIO
Creatinine, Urine: 329.8 mg/dL
Microalb Creat Ratio: 7 mg/g{creat} (ref 0–29)
Microalb, Ur: 23.5 ug/mL — ABNORMAL HIGH

## 2024-03-14 LAB — PROTEIN ELECTRO, RANDOM URINE
Albumin ELP, Urine: 36.2 %
Alpha-1-Globulin, U: 4.4 %
Alpha-2-Globulin, U: 19.3 %
Beta Globulin, U: 26.5 %
Gamma Globulin, U: 13.6 %
Total Protein, Urine: 40.7 mg/dL

## 2024-03-19 LAB — IMMUNOFIXATION, URINE

## 2024-03-30 ENCOUNTER — Other Ambulatory Visit: Payer: Self-pay | Admitting: Physician Assistant

## 2024-03-30 ENCOUNTER — Other Ambulatory Visit (INDEPENDENT_AMBULATORY_CARE_PROVIDER_SITE_OTHER): Payer: Self-pay | Admitting: Gastroenterology

## 2024-04-24 ENCOUNTER — Ambulatory Visit: Admitting: Diagnostic Neuroimaging
